# Patient Record
Sex: Female | Born: 1935 | Hispanic: No | Smoking: Former smoker
Health system: Southern US, Community
[De-identification: ages and names within clinical notes are randomized; demographics above are authoritative.]

## PROBLEM LIST (undated history)

## (undated) DIAGNOSIS — I272 Pulmonary hypertension, unspecified: Secondary | ICD-10-CM

## (undated) DIAGNOSIS — K5790 Diverticulosis of intestine, part unspecified, without perforation or abscess without bleeding: Secondary | ICD-10-CM

## (undated) DIAGNOSIS — E1142 Type 2 diabetes mellitus with diabetic polyneuropathy: Secondary | ICD-10-CM

## (undated) DIAGNOSIS — Z950 Presence of cardiac pacemaker: Secondary | ICD-10-CM

## (undated) DIAGNOSIS — I4891 Unspecified atrial fibrillation: Secondary | ICD-10-CM

## (undated) DIAGNOSIS — F0390 Unspecified dementia without behavioral disturbance: Secondary | ICD-10-CM

## (undated) DIAGNOSIS — I739 Peripheral vascular disease, unspecified: Secondary | ICD-10-CM

## (undated) DIAGNOSIS — I340 Nonrheumatic mitral (valve) insufficiency: Secondary | ICD-10-CM

## (undated) DIAGNOSIS — I10 Essential (primary) hypertension: Secondary | ICD-10-CM

## (undated) DIAGNOSIS — E119 Type 2 diabetes mellitus without complications: Secondary | ICD-10-CM

## (undated) DIAGNOSIS — R413 Other amnesia: Secondary | ICD-10-CM

## (undated) DIAGNOSIS — Z9289 Personal history of other medical treatment: Secondary | ICD-10-CM

## (undated) DIAGNOSIS — K449 Diaphragmatic hernia without obstruction or gangrene: Secondary | ICD-10-CM

## (undated) DIAGNOSIS — Z860101 Personal history of adenomatous and serrated colon polyps: Secondary | ICD-10-CM

## (undated) DIAGNOSIS — E041 Nontoxic single thyroid nodule: Secondary | ICD-10-CM

## (undated) DIAGNOSIS — F419 Anxiety disorder, unspecified: Secondary | ICD-10-CM

## (undated) DIAGNOSIS — R590 Localized enlarged lymph nodes: Secondary | ICD-10-CM

## (undated) DIAGNOSIS — I872 Venous insufficiency (chronic) (peripheral): Secondary | ICD-10-CM

## (undated) DIAGNOSIS — M8580 Other specified disorders of bone density and structure, unspecified site: Secondary | ICD-10-CM

## (undated) DIAGNOSIS — I495 Sick sinus syndrome: Secondary | ICD-10-CM

## (undated) DIAGNOSIS — I779 Disorder of arteries and arterioles, unspecified: Secondary | ICD-10-CM

## (undated) DIAGNOSIS — G8929 Other chronic pain: Secondary | ICD-10-CM

## (undated) DIAGNOSIS — M545 Low back pain, unspecified: Secondary | ICD-10-CM

## (undated) DIAGNOSIS — C50919 Malignant neoplasm of unspecified site of unspecified female breast: Secondary | ICD-10-CM

## (undated) DIAGNOSIS — I2699 Other pulmonary embolism without acute cor pulmonale: Secondary | ICD-10-CM

## (undated) DIAGNOSIS — I509 Heart failure, unspecified: Secondary | ICD-10-CM

## (undated) DIAGNOSIS — K209 Esophagitis, unspecified: Secondary | ICD-10-CM

## (undated) DIAGNOSIS — K253 Acute gastric ulcer without hemorrhage or perforation: Secondary | ICD-10-CM

## (undated) DIAGNOSIS — I5032 Chronic diastolic (congestive) heart failure: Secondary | ICD-10-CM

## (undated) DIAGNOSIS — Z86010 Personal history of colonic polyps: Secondary | ICD-10-CM

## (undated) HISTORY — DX: Other amnesia: R41.3

## (undated) HISTORY — DX: Nonrheumatic mitral (valve) insufficiency: I34.0

## (undated) HISTORY — DX: Diverticulosis of intestine, part unspecified, without perforation or abscess without bleeding: K57.90

## (undated) HISTORY — PX: BACK SURGERY: SHX140

## (undated) HISTORY — DX: Unspecified atrial fibrillation: I48.91

## (undated) HISTORY — DX: Peripheral vascular disease, unspecified: I73.9

## (undated) HISTORY — PX: LUMBAR LAMINECTOMY: SHX95

## (undated) HISTORY — DX: Personal history of colonic polyps: Z86.010

## (undated) HISTORY — DX: Personal history of other medical treatment: Z92.89

## (undated) HISTORY — DX: Other pulmonary embolism without acute cor pulmonale: I26.99

## (undated) HISTORY — DX: Localized enlarged lymph nodes: R59.0

## (undated) HISTORY — DX: Type 2 diabetes mellitus with diabetic polyneuropathy: E11.42

## (undated) HISTORY — PX: ABDOMINAL HYSTERECTOMY: SHX81

## (undated) HISTORY — DX: Essential (primary) hypertension: I10

## (undated) HISTORY — DX: Chronic diastolic (congestive) heart failure: I50.32

## (undated) HISTORY — PX: ANKLE FRACTURE SURGERY: SHX122

## (undated) HISTORY — DX: Personal history of adenomatous and serrated colon polyps: Z86.0101

## (undated) HISTORY — DX: Malignant neoplasm of unspecified site of unspecified female breast: C50.919

## (undated) HISTORY — DX: Acute gastric ulcer without hemorrhage or perforation: K25.3

## (undated) HISTORY — DX: Other specified disorders of bone density and structure, unspecified site: M85.80

## (undated) HISTORY — DX: Disorder of arteries and arterioles, unspecified: I77.9

## (undated) HISTORY — DX: Pulmonary hypertension, unspecified: I27.20

## (undated) HISTORY — DX: Venous insufficiency (chronic) (peripheral): I87.2

## (undated) HISTORY — DX: Esophagitis, unspecified: K20.9

## (undated) HISTORY — PX: BREAST LUMPECTOMY: SHX2

## (undated) HISTORY — DX: Nontoxic single thyroid nodule: E04.1

## (undated) HISTORY — DX: Sick sinus syndrome: I49.5

## (undated) HISTORY — DX: Diaphragmatic hernia without obstruction or gangrene: K44.9

## (undated) HISTORY — PX: FRACTURE SURGERY: SHX138

---

## 1999-09-19 ENCOUNTER — Other Ambulatory Visit: Admission: RE | Admit: 1999-09-19 | Discharge: 1999-09-19 | Payer: Self-pay | Admitting: Internal Medicine

## 2000-05-06 ENCOUNTER — Inpatient Hospital Stay (HOSPITAL_COMMUNITY): Admission: EM | Admit: 2000-05-06 | Discharge: 2000-05-07 | Payer: Self-pay | Admitting: *Deleted

## 2001-02-24 ENCOUNTER — Other Ambulatory Visit: Admission: RE | Admit: 2001-02-24 | Discharge: 2001-02-24 | Payer: Self-pay | Admitting: Internal Medicine

## 2004-06-26 ENCOUNTER — Ambulatory Visit (HOSPITAL_COMMUNITY): Admission: RE | Admit: 2004-06-26 | Discharge: 2004-06-26 | Payer: Self-pay | Admitting: Internal Medicine

## 2004-09-30 DIAGNOSIS — I2699 Other pulmonary embolism without acute cor pulmonale: Secondary | ICD-10-CM

## 2004-09-30 DIAGNOSIS — K253 Acute gastric ulcer without hemorrhage or perforation: Secondary | ICD-10-CM

## 2004-09-30 DIAGNOSIS — K449 Diaphragmatic hernia without obstruction or gangrene: Secondary | ICD-10-CM

## 2004-09-30 DIAGNOSIS — K209 Esophagitis, unspecified without bleeding: Secondary | ICD-10-CM

## 2004-09-30 HISTORY — DX: Esophagitis, unspecified without bleeding: K20.90

## 2004-09-30 HISTORY — DX: Acute gastric ulcer without hemorrhage or perforation: K25.3

## 2004-09-30 HISTORY — DX: Other pulmonary embolism without acute cor pulmonale: I26.99

## 2004-09-30 HISTORY — DX: Diaphragmatic hernia without obstruction or gangrene: K44.9

## 2004-10-16 ENCOUNTER — Ambulatory Visit: Payer: Self-pay | Admitting: Internal Medicine

## 2004-10-16 ENCOUNTER — Inpatient Hospital Stay (HOSPITAL_COMMUNITY): Admission: EM | Admit: 2004-10-16 | Discharge: 2004-10-22 | Payer: Self-pay | Admitting: Emergency Medicine

## 2004-10-16 ENCOUNTER — Ambulatory Visit: Payer: Self-pay | Admitting: Cardiology

## 2004-10-18 ENCOUNTER — Encounter (INDEPENDENT_AMBULATORY_CARE_PROVIDER_SITE_OTHER): Payer: Self-pay | Admitting: *Deleted

## 2004-10-20 ENCOUNTER — Encounter (INDEPENDENT_AMBULATORY_CARE_PROVIDER_SITE_OTHER): Payer: Self-pay | Admitting: *Deleted

## 2004-10-30 ENCOUNTER — Encounter: Admission: RE | Admit: 2004-10-30 | Discharge: 2005-01-28 | Payer: Self-pay | Admitting: Cardiology

## 2004-11-05 ENCOUNTER — Ambulatory Visit: Payer: Self-pay | Admitting: Internal Medicine

## 2004-11-08 ENCOUNTER — Ambulatory Visit: Payer: Self-pay | Admitting: Cardiology

## 2004-12-14 ENCOUNTER — Ambulatory Visit: Payer: Self-pay | Admitting: Cardiology

## 2004-12-19 ENCOUNTER — Ambulatory Visit: Payer: Self-pay

## 2005-01-19 ENCOUNTER — Inpatient Hospital Stay (HOSPITAL_COMMUNITY): Admission: EM | Admit: 2005-01-19 | Discharge: 2005-01-23 | Payer: Self-pay | Admitting: Emergency Medicine

## 2005-01-20 ENCOUNTER — Ambulatory Visit: Payer: Self-pay | Admitting: Internal Medicine

## 2005-02-09 ENCOUNTER — Emergency Department (HOSPITAL_COMMUNITY): Admission: EM | Admit: 2005-02-09 | Discharge: 2005-02-09 | Payer: Self-pay | Admitting: Emergency Medicine

## 2005-02-11 ENCOUNTER — Ambulatory Visit: Payer: Self-pay | Admitting: Cardiology

## 2005-02-11 ENCOUNTER — Inpatient Hospital Stay (HOSPITAL_COMMUNITY): Admission: EM | Admit: 2005-02-11 | Discharge: 2005-02-15 | Payer: Self-pay | Admitting: Emergency Medicine

## 2005-02-18 ENCOUNTER — Ambulatory Visit: Payer: Self-pay | Admitting: Cardiology

## 2005-02-22 ENCOUNTER — Ambulatory Visit: Payer: Self-pay | Admitting: Cardiology

## 2005-03-01 ENCOUNTER — Ambulatory Visit: Payer: Self-pay | Admitting: *Deleted

## 2005-03-08 ENCOUNTER — Ambulatory Visit: Payer: Self-pay | Admitting: Cardiology

## 2005-03-15 ENCOUNTER — Ambulatory Visit: Payer: Self-pay | Admitting: Cardiology

## 2005-03-29 ENCOUNTER — Ambulatory Visit: Payer: Self-pay | Admitting: Cardiology

## 2005-04-04 ENCOUNTER — Ambulatory Visit: Payer: Self-pay | Admitting: Internal Medicine

## 2005-04-16 ENCOUNTER — Ambulatory Visit: Payer: Self-pay | Admitting: Cardiology

## 2005-04-25 ENCOUNTER — Ambulatory Visit: Payer: Self-pay | Admitting: Cardiology

## 2005-05-23 ENCOUNTER — Ambulatory Visit: Payer: Self-pay | Admitting: Cardiology

## 2005-06-20 ENCOUNTER — Ambulatory Visit: Payer: Self-pay | Admitting: Cardiology

## 2005-07-18 ENCOUNTER — Ambulatory Visit: Payer: Self-pay | Admitting: Cardiology

## 2005-07-25 ENCOUNTER — Ambulatory Visit: Payer: Self-pay | Admitting: Internal Medicine

## 2005-08-08 ENCOUNTER — Ambulatory Visit: Payer: Self-pay | Admitting: *Deleted

## 2005-09-05 ENCOUNTER — Ambulatory Visit: Payer: Self-pay | Admitting: Internal Medicine

## 2005-09-26 ENCOUNTER — Ambulatory Visit: Payer: Self-pay | Admitting: *Deleted

## 2005-10-17 ENCOUNTER — Ambulatory Visit: Payer: Self-pay | Admitting: Cardiology

## 2005-11-07 ENCOUNTER — Ambulatory Visit: Payer: Self-pay | Admitting: Cardiology

## 2005-12-05 ENCOUNTER — Ambulatory Visit: Payer: Self-pay | Admitting: Cardiology

## 2005-12-17 ENCOUNTER — Ambulatory Visit: Payer: Self-pay | Admitting: Internal Medicine

## 2005-12-26 ENCOUNTER — Ambulatory Visit: Payer: Self-pay

## 2006-01-02 ENCOUNTER — Ambulatory Visit: Payer: Self-pay | Admitting: Cardiology

## 2006-01-15 ENCOUNTER — Ambulatory Visit: Payer: Self-pay | Admitting: *Deleted

## 2006-01-29 ENCOUNTER — Ambulatory Visit: Payer: Self-pay | Admitting: Cardiology

## 2006-02-12 ENCOUNTER — Ambulatory Visit: Payer: Self-pay | Admitting: Cardiology

## 2006-03-12 ENCOUNTER — Ambulatory Visit: Payer: Self-pay | Admitting: Cardiology

## 2006-04-09 ENCOUNTER — Ambulatory Visit: Payer: Self-pay | Admitting: Cardiovascular Disease

## 2006-04-16 ENCOUNTER — Emergency Department (HOSPITAL_COMMUNITY): Admission: EM | Admit: 2006-04-16 | Discharge: 2006-04-16 | Payer: Self-pay | Admitting: Emergency Medicine

## 2006-04-29 ENCOUNTER — Ambulatory Visit: Payer: Self-pay | Admitting: Cardiology

## 2006-05-06 ENCOUNTER — Ambulatory Visit: Payer: Self-pay | Admitting: Cardiology

## 2006-05-22 ENCOUNTER — Ambulatory Visit: Payer: Self-pay | Admitting: Cardiology

## 2006-05-23 ENCOUNTER — Ambulatory Visit: Payer: Self-pay

## 2006-05-23 ENCOUNTER — Encounter: Payer: Self-pay | Admitting: Cardiology

## 2006-05-27 ENCOUNTER — Ambulatory Visit: Payer: Self-pay | Admitting: *Deleted

## 2006-06-09 ENCOUNTER — Emergency Department (HOSPITAL_COMMUNITY): Admission: EM | Admit: 2006-06-09 | Discharge: 2006-06-10 | Payer: Self-pay | Admitting: Emergency Medicine

## 2006-06-13 ENCOUNTER — Ambulatory Visit: Payer: Self-pay | Admitting: Internal Medicine

## 2006-06-23 ENCOUNTER — Ambulatory Visit: Payer: Self-pay | Admitting: Cardiology

## 2006-07-01 ENCOUNTER — Ambulatory Visit: Payer: Self-pay | Admitting: Cardiology

## 2006-07-04 ENCOUNTER — Ambulatory Visit: Payer: Self-pay | Admitting: Cardiology

## 2006-07-07 ENCOUNTER — Ambulatory Visit: Payer: Self-pay | Admitting: Cardiology

## 2006-08-01 ENCOUNTER — Ambulatory Visit: Payer: Self-pay | Admitting: Internal Medicine

## 2006-08-18 ENCOUNTER — Ambulatory Visit: Payer: Self-pay | Admitting: Cardiology

## 2006-08-29 ENCOUNTER — Ambulatory Visit: Payer: Self-pay | Admitting: Cardiology

## 2006-09-15 ENCOUNTER — Ambulatory Visit: Payer: Self-pay | Admitting: Cardiology

## 2006-10-09 ENCOUNTER — Ambulatory Visit: Payer: Self-pay | Admitting: Cardiology

## 2006-10-23 ENCOUNTER — Ambulatory Visit: Payer: Self-pay | Admitting: Cardiology

## 2006-11-20 ENCOUNTER — Ambulatory Visit: Payer: Self-pay | Admitting: Cardiology

## 2006-12-18 ENCOUNTER — Ambulatory Visit: Payer: Self-pay | Admitting: Cardiology

## 2007-01-15 ENCOUNTER — Ambulatory Visit: Payer: Self-pay | Admitting: Cardiology

## 2007-02-06 ENCOUNTER — Ambulatory Visit: Payer: Self-pay | Admitting: Cardiology

## 2007-02-06 LAB — CONVERTED CEMR LAB
Calcium: 9.3 mg/dL (ref 8.4–10.5)
GFR calc Af Amer: 90 mL/min
GFR calc non Af Amer: 75 mL/min
Glucose, Bld: 85 mg/dL (ref 70–99)
Potassium: 4.3 meq/L (ref 3.5–5.1)

## 2007-02-13 ENCOUNTER — Ambulatory Visit: Payer: Self-pay | Admitting: Cardiology

## 2007-03-13 ENCOUNTER — Ambulatory Visit: Payer: Self-pay | Admitting: Cardiology

## 2007-03-27 ENCOUNTER — Ambulatory Visit: Payer: Self-pay | Admitting: Cardiology

## 2007-03-27 LAB — CONVERTED CEMR LAB
Chloride: 107 meq/L (ref 96–112)
GFR calc Af Amer: 62 mL/min
GFR calc non Af Amer: 52 mL/min
Potassium: 3.9 meq/L (ref 3.5–5.1)
Sodium: 142 meq/L (ref 135–145)

## 2007-04-10 ENCOUNTER — Ambulatory Visit: Payer: Self-pay | Admitting: Cardiology

## 2007-05-08 ENCOUNTER — Ambulatory Visit: Payer: Self-pay | Admitting: Cardiology

## 2007-05-12 ENCOUNTER — Ambulatory Visit: Payer: Self-pay | Admitting: Cardiology

## 2007-06-05 ENCOUNTER — Ambulatory Visit: Payer: Self-pay | Admitting: Cardiology

## 2007-07-03 ENCOUNTER — Ambulatory Visit: Payer: Self-pay | Admitting: Internal Medicine

## 2007-08-04 ENCOUNTER — Ambulatory Visit: Payer: Self-pay | Admitting: Cardiology

## 2007-09-01 ENCOUNTER — Ambulatory Visit: Payer: Self-pay | Admitting: Cardiology

## 2007-09-03 ENCOUNTER — Emergency Department (HOSPITAL_COMMUNITY): Admission: EM | Admit: 2007-09-03 | Discharge: 2007-09-03 | Payer: Self-pay | Admitting: Emergency Medicine

## 2007-10-02 ENCOUNTER — Ambulatory Visit: Payer: Self-pay | Admitting: Cardiology

## 2007-10-30 ENCOUNTER — Ambulatory Visit: Payer: Self-pay | Admitting: Cardiology

## 2007-11-27 ENCOUNTER — Ambulatory Visit: Payer: Self-pay | Admitting: Internal Medicine

## 2007-12-25 ENCOUNTER — Ambulatory Visit: Payer: Self-pay | Admitting: Cardiology

## 2008-01-22 ENCOUNTER — Ambulatory Visit: Payer: Self-pay | Admitting: Internal Medicine

## 2008-02-11 ENCOUNTER — Ambulatory Visit: Payer: Self-pay | Admitting: Internal Medicine

## 2008-02-11 ENCOUNTER — Ambulatory Visit: Payer: Self-pay

## 2008-03-10 ENCOUNTER — Ambulatory Visit: Payer: Self-pay | Admitting: Cardiology

## 2008-03-31 ENCOUNTER — Emergency Department (HOSPITAL_COMMUNITY): Admission: EM | Admit: 2008-03-31 | Discharge: 2008-04-01 | Payer: Self-pay | Admitting: Emergency Medicine

## 2008-03-31 ENCOUNTER — Ambulatory Visit: Payer: Self-pay | Admitting: Cardiology

## 2008-04-20 ENCOUNTER — Ambulatory Visit: Payer: Self-pay | Admitting: Cardiology

## 2008-04-22 ENCOUNTER — Ambulatory Visit: Payer: Self-pay

## 2008-05-05 ENCOUNTER — Encounter: Admission: RE | Admit: 2008-05-05 | Discharge: 2008-05-05 | Payer: Self-pay | Admitting: Internal Medicine

## 2008-05-18 ENCOUNTER — Ambulatory Visit: Payer: Self-pay | Admitting: Cardiology

## 2008-05-25 ENCOUNTER — Ambulatory Visit: Payer: Self-pay | Admitting: Cardiology

## 2008-06-01 ENCOUNTER — Other Ambulatory Visit: Admission: RE | Admit: 2008-06-01 | Discharge: 2008-06-01 | Payer: Self-pay | Admitting: Interventional Radiology

## 2008-06-01 ENCOUNTER — Ambulatory Visit: Payer: Self-pay | Admitting: Cardiology

## 2008-06-01 ENCOUNTER — Encounter (INDEPENDENT_AMBULATORY_CARE_PROVIDER_SITE_OTHER): Payer: Self-pay | Admitting: Interventional Radiology

## 2008-06-01 ENCOUNTER — Encounter: Admission: RE | Admit: 2008-06-01 | Discharge: 2008-06-01 | Payer: Self-pay | Admitting: Internal Medicine

## 2008-06-13 ENCOUNTER — Ambulatory Visit: Payer: Self-pay | Admitting: Cardiology

## 2008-06-13 ENCOUNTER — Ambulatory Visit: Payer: Self-pay | Admitting: Pulmonary Disease

## 2008-06-13 DIAGNOSIS — R599 Enlarged lymph nodes, unspecified: Secondary | ICD-10-CM | POA: Insufficient documentation

## 2008-06-13 DIAGNOSIS — J309 Allergic rhinitis, unspecified: Secondary | ICD-10-CM | POA: Insufficient documentation

## 2008-06-13 DIAGNOSIS — E119 Type 2 diabetes mellitus without complications: Secondary | ICD-10-CM | POA: Insufficient documentation

## 2008-07-11 ENCOUNTER — Ambulatory Visit: Payer: Self-pay | Admitting: Cardiology

## 2008-07-13 ENCOUNTER — Ambulatory Visit: Payer: Self-pay | Admitting: Cardiology

## 2008-08-08 ENCOUNTER — Ambulatory Visit: Payer: Self-pay | Admitting: Cardiovascular Disease

## 2008-09-07 ENCOUNTER — Ambulatory Visit: Payer: Self-pay | Admitting: Cardiovascular Disease

## 2008-09-22 ENCOUNTER — Ambulatory Visit: Payer: Self-pay | Admitting: Cardiovascular Disease

## 2008-10-20 ENCOUNTER — Ambulatory Visit: Payer: Self-pay | Admitting: Internal Medicine

## 2008-11-17 ENCOUNTER — Ambulatory Visit: Payer: Self-pay | Admitting: Cardiovascular Disease

## 2008-12-15 ENCOUNTER — Ambulatory Visit: Payer: Self-pay | Admitting: Internal Medicine

## 2009-01-12 ENCOUNTER — Ambulatory Visit: Payer: Self-pay | Admitting: Cardiology

## 2009-02-09 ENCOUNTER — Ambulatory Visit: Payer: Self-pay | Admitting: Cardiology

## 2009-02-28 ENCOUNTER — Encounter: Payer: Self-pay | Admitting: *Deleted

## 2009-03-09 ENCOUNTER — Ambulatory Visit: Payer: Self-pay | Admitting: Cardiovascular Disease

## 2009-03-11 LAB — CONVERTED CEMR LAB: POC INR: 2.6

## 2009-04-05 ENCOUNTER — Encounter: Payer: Self-pay | Admitting: *Deleted

## 2009-04-06 ENCOUNTER — Ambulatory Visit: Payer: Self-pay | Admitting: Cardiology

## 2009-04-08 ENCOUNTER — Emergency Department (HOSPITAL_COMMUNITY): Admission: EM | Admit: 2009-04-08 | Discharge: 2009-04-08 | Payer: Self-pay | Admitting: Emergency Medicine

## 2009-04-10 ENCOUNTER — Telehealth: Payer: Self-pay | Admitting: Cardiology

## 2009-04-21 ENCOUNTER — Ambulatory Visit: Payer: Self-pay | Admitting: Internal Medicine

## 2009-04-21 LAB — CONVERTED CEMR LAB: Prothrombin Time: 12.9 s

## 2009-04-28 ENCOUNTER — Ambulatory Visit: Payer: Self-pay | Admitting: Cardiology

## 2009-05-09 ENCOUNTER — Inpatient Hospital Stay: Payer: Self-pay | Admitting: Internal Medicine

## 2009-05-11 ENCOUNTER — Telehealth (INDEPENDENT_AMBULATORY_CARE_PROVIDER_SITE_OTHER): Payer: Self-pay | Admitting: *Deleted

## 2009-05-26 ENCOUNTER — Ambulatory Visit: Payer: Self-pay | Admitting: Internal Medicine

## 2009-05-26 LAB — CONVERTED CEMR LAB: POC INR: 2.5

## 2009-06-23 ENCOUNTER — Ambulatory Visit: Payer: Self-pay | Admitting: Internal Medicine

## 2009-06-23 LAB — CONVERTED CEMR LAB: POC INR: 2.9

## 2009-06-30 HISTORY — PX: INSERT / REPLACE / REMOVE PACEMAKER: SUR710

## 2009-07-15 ENCOUNTER — Emergency Department (HOSPITAL_COMMUNITY): Admission: EM | Admit: 2009-07-15 | Discharge: 2009-07-15 | Payer: Self-pay | Admitting: Emergency Medicine

## 2009-07-17 ENCOUNTER — Telehealth: Payer: Self-pay | Admitting: Cardiology

## 2009-07-19 ENCOUNTER — Ambulatory Visit: Payer: Self-pay | Admitting: Internal Medicine

## 2009-07-19 DIAGNOSIS — N390 Urinary tract infection, site not specified: Secondary | ICD-10-CM | POA: Insufficient documentation

## 2009-07-25 ENCOUNTER — Ambulatory Visit (HOSPITAL_COMMUNITY): Admission: RE | Admit: 2009-07-25 | Discharge: 2009-07-25 | Payer: Self-pay | Admitting: Internal Medicine

## 2009-07-25 ENCOUNTER — Ambulatory Visit: Payer: Self-pay | Admitting: Cardiology

## 2009-07-25 ENCOUNTER — Telehealth: Payer: Self-pay | Admitting: Cardiology

## 2009-07-25 ENCOUNTER — Ambulatory Visit: Payer: Self-pay

## 2009-07-25 ENCOUNTER — Encounter: Payer: Self-pay | Admitting: Internal Medicine

## 2009-07-31 ENCOUNTER — Telehealth: Payer: Self-pay | Admitting: Cardiology

## 2009-07-31 ENCOUNTER — Inpatient Hospital Stay (HOSPITAL_COMMUNITY): Admission: AD | Admit: 2009-07-31 | Discharge: 2009-08-04 | Payer: Self-pay | Admitting: Cardiology

## 2009-07-31 ENCOUNTER — Encounter: Payer: Self-pay | Admitting: Cardiology

## 2009-07-31 ENCOUNTER — Ambulatory Visit: Payer: Self-pay | Admitting: Internal Medicine

## 2009-08-04 ENCOUNTER — Encounter: Payer: Self-pay | Admitting: Cardiology

## 2009-08-07 ENCOUNTER — Telehealth (INDEPENDENT_AMBULATORY_CARE_PROVIDER_SITE_OTHER): Payer: Self-pay | Admitting: Radiology

## 2009-08-08 ENCOUNTER — Ambulatory Visit: Payer: Self-pay | Admitting: Internal Medicine

## 2009-08-08 ENCOUNTER — Ambulatory Visit: Payer: Self-pay

## 2009-08-08 ENCOUNTER — Ambulatory Visit: Payer: Self-pay | Admitting: Cardiology

## 2009-08-08 ENCOUNTER — Encounter (HOSPITAL_COMMUNITY): Admission: RE | Admit: 2009-08-08 | Discharge: 2009-09-28 | Payer: Self-pay | Admitting: Internal Medicine

## 2009-08-08 LAB — CONVERTED CEMR LAB
Calcium: 9.5 mg/dL (ref 8.4–10.5)
Creatinine, Ser: 0.9 mg/dL (ref 0.4–1.2)
GFR calc non Af Amer: 78.13 mL/min (ref >60)
POC INR: 2.3
Sodium: 140 meq/L (ref 135–145)

## 2009-08-13 ENCOUNTER — Encounter: Payer: Self-pay | Admitting: Cardiology

## 2009-08-14 ENCOUNTER — Encounter: Payer: Self-pay | Admitting: Internal Medicine

## 2009-08-14 ENCOUNTER — Ambulatory Visit: Payer: Self-pay | Admitting: Cardiology

## 2009-08-14 ENCOUNTER — Ambulatory Visit: Payer: Self-pay

## 2009-08-14 LAB — CONVERTED CEMR LAB: POC INR: 3.6

## 2009-08-15 ENCOUNTER — Encounter: Payer: Self-pay | Admitting: Cardiology

## 2009-08-17 ENCOUNTER — Encounter: Payer: Self-pay | Admitting: Cardiology

## 2009-08-21 ENCOUNTER — Telehealth: Payer: Self-pay | Admitting: Cardiology

## 2009-08-22 ENCOUNTER — Ambulatory Visit: Payer: Self-pay | Admitting: Cardiovascular Disease

## 2009-08-22 ENCOUNTER — Encounter: Payer: Self-pay | Admitting: Nurse Practitioner

## 2009-08-23 LAB — CONVERTED CEMR LAB: Pro B Natriuretic peptide (BNP): 147 pg/mL — ABNORMAL HIGH (ref 0.0–100.0)

## 2009-08-30 ENCOUNTER — Ambulatory Visit: Payer: Self-pay | Admitting: Cardiology

## 2009-08-30 LAB — CONVERTED CEMR LAB: POC INR: 4.1

## 2009-09-13 ENCOUNTER — Ambulatory Visit: Payer: Self-pay | Admitting: Cardiology

## 2009-09-13 ENCOUNTER — Encounter (INDEPENDENT_AMBULATORY_CARE_PROVIDER_SITE_OTHER): Payer: Self-pay | Admitting: Cardiology

## 2009-10-04 ENCOUNTER — Encounter (INDEPENDENT_AMBULATORY_CARE_PROVIDER_SITE_OTHER): Payer: Self-pay | Admitting: Cardiology

## 2009-10-04 ENCOUNTER — Ambulatory Visit: Payer: Self-pay | Admitting: Cardiology

## 2009-11-13 ENCOUNTER — Ambulatory Visit: Payer: Self-pay | Admitting: Cardiology

## 2009-11-13 ENCOUNTER — Encounter (INDEPENDENT_AMBULATORY_CARE_PROVIDER_SITE_OTHER): Payer: Self-pay | Admitting: Cardiology

## 2009-11-13 ENCOUNTER — Ambulatory Visit: Payer: Self-pay | Admitting: Internal Medicine

## 2009-12-11 ENCOUNTER — Encounter: Payer: Self-pay | Admitting: Cardiology

## 2009-12-12 ENCOUNTER — Ambulatory Visit: Payer: Self-pay | Admitting: Cardiology

## 2010-01-09 ENCOUNTER — Ambulatory Visit: Payer: Self-pay | Admitting: Cardiology

## 2010-01-09 LAB — CONVERTED CEMR LAB: POC INR: 2.5

## 2010-01-12 ENCOUNTER — Encounter: Admission: RE | Admit: 2010-01-12 | Discharge: 2010-01-12 | Payer: Self-pay | Admitting: Internal Medicine

## 2010-01-16 ENCOUNTER — Encounter: Admission: RE | Admit: 2010-01-16 | Discharge: 2010-01-16 | Payer: Self-pay | Admitting: Internal Medicine

## 2010-01-18 ENCOUNTER — Encounter: Payer: Self-pay | Admitting: Cardiology

## 2010-01-23 ENCOUNTER — Telehealth: Payer: Self-pay | Admitting: Cardiology

## 2010-02-06 ENCOUNTER — Encounter: Admission: RE | Admit: 2010-02-06 | Discharge: 2010-02-06 | Payer: Self-pay | Admitting: Surgery

## 2010-02-07 ENCOUNTER — Ambulatory Visit (HOSPITAL_BASED_OUTPATIENT_CLINIC_OR_DEPARTMENT_OTHER): Admission: RE | Admit: 2010-02-07 | Discharge: 2010-02-08 | Payer: Self-pay | Admitting: Surgery

## 2010-02-14 ENCOUNTER — Ambulatory Visit: Payer: Self-pay | Admitting: Cardiology

## 2010-02-14 LAB — CONVERTED CEMR LAB: POC INR: 2.3

## 2010-02-15 ENCOUNTER — Ambulatory Visit: Payer: Self-pay | Admitting: Oncology

## 2010-02-23 ENCOUNTER — Emergency Department (HOSPITAL_COMMUNITY): Admission: EM | Admit: 2010-02-23 | Discharge: 2010-02-23 | Payer: Self-pay | Admitting: Emergency Medicine

## 2010-02-24 ENCOUNTER — Emergency Department (HOSPITAL_COMMUNITY): Admission: EM | Admit: 2010-02-24 | Discharge: 2010-02-24 | Payer: Self-pay | Admitting: Emergency Medicine

## 2010-02-28 LAB — CBC WITH DIFFERENTIAL/PLATELET
BASO%: 1 % (ref 0.0–2.0)
HCT: 36.1 % (ref 34.8–46.6)
LYMPH%: 20.4 % (ref 14.0–49.7)
MCHC: 34.3 g/dL (ref 31.5–36.0)
MCV: 87.6 fL (ref 79.5–101.0)
MONO#: 0.5 10*3/uL (ref 0.1–0.9)
MONO%: 10.6 % (ref 0.0–14.0)
NEUT%: 65.8 % (ref 38.4–76.8)
Platelets: 389 10*3/uL (ref 145–400)
RBC: 4.12 10*6/uL (ref 3.70–5.45)
WBC: 4.9 10*3/uL (ref 3.9–10.3)

## 2010-02-28 LAB — COMPREHENSIVE METABOLIC PANEL
ALT: 12 U/L (ref 0–35)
Alkaline Phosphatase: 94 U/L (ref 39–117)
CO2: 24 mEq/L (ref 19–32)
Creatinine, Ser: 1.18 mg/dL (ref 0.40–1.20)
Glucose, Bld: 104 mg/dL — ABNORMAL HIGH (ref 70–99)
Total Bilirubin: 0.5 mg/dL (ref 0.3–1.2)

## 2010-03-05 ENCOUNTER — Ambulatory Visit: Admission: RE | Admit: 2010-03-05 | Discharge: 2010-05-24 | Payer: Self-pay | Admitting: Radiation Oncology

## 2010-03-06 ENCOUNTER — Encounter: Payer: Self-pay | Admitting: Cardiology

## 2010-03-08 ENCOUNTER — Encounter: Admission: RE | Admit: 2010-03-08 | Discharge: 2010-03-08 | Payer: Self-pay | Admitting: Oncology

## 2010-03-14 ENCOUNTER — Ambulatory Visit: Payer: Self-pay | Admitting: Cardiology

## 2010-03-14 LAB — CONVERTED CEMR LAB: POC INR: 2.2

## 2010-04-11 ENCOUNTER — Ambulatory Visit: Payer: Self-pay | Admitting: Cardiology

## 2010-04-15 ENCOUNTER — Emergency Department (HOSPITAL_COMMUNITY): Admission: EM | Admit: 2010-04-15 | Discharge: 2010-04-16 | Payer: Self-pay | Admitting: Emergency Medicine

## 2010-04-16 ENCOUNTER — Telehealth (INDEPENDENT_AMBULATORY_CARE_PROVIDER_SITE_OTHER): Payer: Self-pay | Admitting: *Deleted

## 2010-05-09 ENCOUNTER — Ambulatory Visit: Payer: Self-pay | Admitting: Cardiology

## 2010-05-09 LAB — CONVERTED CEMR LAB: POC INR: 1.7

## 2010-05-24 ENCOUNTER — Ambulatory Visit: Payer: Self-pay | Admitting: Internal Medicine

## 2010-05-24 LAB — CONVERTED CEMR LAB: POC INR: 1.8

## 2010-06-07 ENCOUNTER — Ambulatory Visit: Payer: Self-pay | Admitting: Cardiology

## 2010-06-12 ENCOUNTER — Ambulatory Visit: Payer: Self-pay | Admitting: Oncology

## 2010-06-18 ENCOUNTER — Encounter: Payer: Self-pay | Admitting: Cardiology

## 2010-07-05 ENCOUNTER — Ambulatory Visit: Payer: Self-pay | Admitting: Internal Medicine

## 2010-08-02 ENCOUNTER — Ambulatory Visit: Payer: Self-pay | Admitting: Cardiology

## 2010-08-22 ENCOUNTER — Ambulatory Visit: Payer: Self-pay | Admitting: Internal Medicine

## 2010-08-31 ENCOUNTER — Ambulatory Visit: Payer: Self-pay | Admitting: Cardiology

## 2010-09-11 ENCOUNTER — Ambulatory Visit: Payer: Self-pay | Admitting: Oncology

## 2010-09-28 ENCOUNTER — Ambulatory Visit: Payer: Self-pay

## 2010-09-28 ENCOUNTER — Ambulatory Visit: Payer: Self-pay | Admitting: Cardiology

## 2010-09-28 ENCOUNTER — Encounter: Payer: Self-pay | Admitting: Cardiology

## 2010-09-28 LAB — CONVERTED CEMR LAB: POC INR: 2.5

## 2010-10-21 ENCOUNTER — Encounter: Payer: Self-pay | Admitting: Internal Medicine

## 2010-10-22 ENCOUNTER — Encounter: Payer: Self-pay | Admitting: Internal Medicine

## 2010-10-22 ENCOUNTER — Encounter: Payer: Self-pay | Admitting: Pulmonary Disease

## 2010-10-26 ENCOUNTER — Ambulatory Visit: Admission: RE | Admit: 2010-10-26 | Discharge: 2010-10-26 | Payer: Self-pay | Source: Home / Self Care

## 2010-10-26 LAB — CONVERTED CEMR LAB: POC INR: 2.5

## 2010-10-28 LAB — CONVERTED CEMR LAB: POC INR: 2.7

## 2010-11-01 NOTE — Progress Notes (Signed)
Summary: Coumadin Question  Phone Note Call from Patient   Caller: Patient Call For: Coumadin Clinic Summary of Call: Pt was seen in ER yesterday after syncopal episode.  She was diagnosed with UTI.  INR was 1. 72.  She was prescribed an abx x 3 days- unsure which one but likely Cipro.  Instructed pt to take an extra 1/2 tablet of Coumadin today then reusme same dose.  WIll keep her original appt but she is to call if any abx are changed in the meantime.  Initial call taken by: Porfirio Oar PharmD,  April 16, 2010 8:27 AM

## 2010-11-01 NOTE — Medication Information (Signed)
Summary: rov/tm  Anticoagulant Therapy  Managed by: Tula Nakayama, RN, BSN Referring MD: Dola Argyle, MD PCP: Jeanann Lewandowsky, MD Supervising MD: Aundra Dubin MD, Easton Fetty Indication 1: Pulmonary embolus (ICD-415.19) Indication 2: Gastrointestinal Hemorrhage (ICD-578) Lab Used: [facility] [place] Site: [facility] INR POC 2.1 INR RANGE 2 - 3  Dietary changes: no    Health status changes: no    Bleeding/hemorrhagic complications: no    Recent/future hospitalizations: no    Any changes in medication regimen? yes       Details: started anastrozole 1mg  daily  Recent/future dental: no  Any missed doses?: no       Is patient compliant with meds? yes       Current Medications (verified): 1)  Metoprolol Tartrate 100 Mg Tabs (Metoprolol Tartrate) .... Take One Tablet By Mouth Twice A Day 2)  Warfarin Sodium 5 Mg Tabs (Warfarin Sodium) .... Take As Directed By Coumadin Clinic 3)  Adult Aspirin Ec Low Strength 81 Mg Tbec (Aspirin) .... Once Daily 4)  Vitamin B-12 500 Mcg Tabs (Cyanocobalamin) .... Once Daily 5)  Vitamin B-6 Cr 200 Mg Cr-Tabs (Pyridoxine Hcl) .... Once Daily 6)  Metformin Hcl 500 Mg Tabs (Metformin Hcl) .Marland Kitchen.. 1 1/2 Tab By Mouth Two Times A Day 7)  Diovan 160 Mg Tabs (Valsartan) .... Once Daily 8)  Cardizem Cd 240 Mg Xr24h-Cap (Diltiazem Hcl Coated Beads) .Marland Kitchen.. 1 Cap By Mouth Daily 9)  Furosemide 20 Mg Tabs (Furosemide) .... Take One Tablet By Mouth Daily. 10)  Flecainide Acetate 100 Mg Tabs (Flecainide Acetate) .... Take 1 Tablet By Mouth Two Times A Day 11)  Anastrozole 1 Mg Tabs (Anastrozole) .... Take 1 By Mouth Daily 12)  Oyst-Cal D 250-125 Mg-Unit Tabs (Calcium Carbonate-Vitamin D) .... Take 1 By Mouth Daily  Allergies: No Known Drug Allergies  Anticoagulation Management History:      The patient is taking warfarin and comes in today for a routine follow up visit.  Positive risk factors for bleeding include an age of 76 years or older and presence of serious comorbidities.   The bleeding index is 'intermediate risk'.  Positive CHADS2 values include History of HTN, Age > 19 years old, and History of Diabetes.  The start date was 02/12/2005.  Her last INR was 4.1.  Anticoagulation responsible provider: Aundra Dubin MD, Asherah Lavoy.  INR POC: 2.1.  Cuvette Lot#: [id number].  Exp: 06/2011.    Anticoagulation Management Assessment/Plan:      The patient's current anticoagulation dose is Warfarin sodium 5 mg tabs: take as directed by coumadin clinic.  The target INR is 2 - 3.  The next INR is due 05/09/2010.  Anticoagulation instructions were given to patient.  Results were reviewed/authorized by Tula Nakayama, RN, BSN.  She was notified by Lind Covert.         Prior Anticoagulation Instructions: INR 2.2 Continue 2.5mg s everyday except 5mg s on Tuesdays and Thursdays. Recheck in 4 weeks.   Current Anticoagulation Instructions: INR 2.1  Continue same regimen of 1 tab on Tuesday and Thursday and 1/2 tab all other days.  Re-check in 4 weeks.

## 2010-11-01 NOTE — Medication Information (Signed)
Summary: rov/eac  Anticoagulant Therapy  Managed by: Porfirio Oar, PharmD Referring MD: Dola Argyle, MD PCP: Jeanann Lewandowsky, MD Supervising MD: Aundra Dubin MD, Kirk Ruths Indication 1: Pulmonary embolus (ICD-415.19) Indication 2: Gastrointestinal Hemorrhage (ICD-578) Lab Used: [facility] [place] Site: [facility] INR POC 2.5 INR RANGE 2 - 3  Dietary changes: no    Health status changes: no    Bleeding/hemorrhagic complications: no    Recent/future hospitalizations: no    Any changes in medication regimen? no    Recent/future dental: no  Any missed doses?: no       Is patient compliant with meds? yes       Allergies: No Known Drug Allergies  Anticoagulation Management History:      The patient is taking warfarin and comes in today for a routine follow up visit.  Positive risk factors for bleeding include an age of 75 years or older and presence of serious comorbidities.  The bleeding index is 'intermediate risk'.  Positive CHADS2 values include History of HTN, Age > 29 years old, and History of Diabetes.  The start date was 02/12/2005.  Her last INR was 4.1.  Anticoagulation responsible provider: Aundra Dubin MD, Jerrit Horen.  INR POC: 2.5.  Cuvette Lot#: [id number].  Exp: 01/2011.    Anticoagulation Management Assessment/Plan:      The patient's current anticoagulation dose is Warfarin sodium 5 mg tabs: take as directed by coumadin clinic.  The target INR is 2 - 3.  The next INR is due 02/06/2010.  Anticoagulation instructions were given to patient.  Results were reviewed/authorized by Porfirio Oar, PharmD.  She was notified by Porfirio Oar PharmD.         Prior Anticoagulation Instructions: INR 2.9  Continue taking 1 tablet on Tuesday and Thursday and 1/2 tablet all other days.  Return to clinic in 4 weeks.    Current Anticoagulation Instructions: INR 2.5  Continue same dose of 1/2 tablet every day except 1 tablet on Tuesday and Thursday

## 2010-11-01 NOTE — Cardiovascular Report (Signed)
Summary: Office Visit   Office Visit   Imported By: Sallee Provencal 11/20/2009 16:30:43  _____________________________________________________________________  External Attachment:    Type:   Image     Comment:   External Document

## 2010-11-01 NOTE — Assessment & Plan Note (Signed)
Summary: per check out      Allergies Added: NKDA  Visit Type:  ROV Referring Provider:  Dola Argyle Primary Provider:  Jeanann Lewandowsky, MD  CC:  No cardiac complaints..  History of Present Illness: the patient is 77 years old and return for followup management of her pacemaker and atrial fibrillation. She has a history of paroxysmal atrial fibrillation and on November 2 she underwent implantation of a Medtronic DDD pacemaker for long sinus pauses associated with presyncope following conversion from atrial fib to sinus rhythm. She has done well since that time with no recurrent palpitations or shortness of breath.  She did have some fatigue and chest pain and was evaluated with a Myoview scan which was negative and showed an ejection fraction of 69%. Her other main problem is hypertension.  Greater fibrillation is now managed with Flecanide and Coumadin.  Current Medications (verified): 1)  Isosorbide Mononitrate Cr 30 Mg Xr24h-Tab (Isosorbide Mononitrate) .... Take 1 Tablet By Mouth Once A Day 2)  Metoprolol Tartrate 100 Mg Tabs (Metoprolol Tartrate) .... Take One Tablet By Mouth Twice A Day 3)  Warfarin Sodium 5 Mg Tabs (Warfarin Sodium) .... Take As Directed By Coumadin Clinic 4)  Adult Aspirin Ec Low Strength 81 Mg Tbec (Aspirin) .... Once Daily 5)  Vitamin B-12 500 Mcg Tabs (Cyanocobalamin) .... Once Daily 6)  Vitamin B-6 Cr 200 Mg Cr-Tabs (Pyridoxine Hcl) .... Once Daily 7)  Metformin Hcl 500 Mg Tabs (Metformin Hcl) .Marland Kitchen.. 1 1/2 Tab By Mouth Two Times A Day 8)  Diovan 160 Mg Tabs (Valsartan) .... Once Daily 9)  Cardizem Cd 240 Mg Xr24h-Cap (Diltiazem Hcl Coated Beads) .Marland Kitchen.. 1 Cap By Mouth Daily 10)  Furosemide 20 Mg Tabs (Furosemide) .... Take One Tablet By Mouth Daily. 11)  Flecainide Acetate 100 Mg Tabs (Flecainide Acetate) .... Take 1 Tablet By Mouth Two Times A Day  Allergies (verified): No Known Drug Allergies  Past History:  Past Medical History: Reviewed history from  08/13/2009 and no changes required. Myoview ..no ischemia..EF 65% ...08/08/2009 PAROXYSMAL ATRIAL FIBRILLATION (ICD-427.31) Pacemaker , permanent....07/2009.Marland KitchenMarland KitchenMedtronic.Marland KitchenMarland KitchenBrady-tachy with long pauses and syncope Syncope....PTVDP Atrial flutter  07/2009.Marland KitchenMarland Kitchenflecainide Flecainide Rx Vulome overload...mild...07/2009 EF  60%  echo  ..06/2009 Pulmonary hypertension .Marland Kitchen21mmHg...echo...06/2009 MR  mild...echo...06/2009 HYPERTENSION (ICD-401.9) DVT (ICD-453.40)  .and Pul Embolus in the past Dyspnea   --Myoview negative 7/09 Carotid artery disease  ..[id number] RICA, [id number] LICA  doppler .Marland KitchenMarland Kitchen5/2009 PULMONARY EMBOLISM, HX OF (ICD-V12.51) - 2006   --Chest CT 2009 No PE DM (ICD-250.00) MEDIASTINAL LYMPHADENOPATHY (ICD-785.6) ALLERGIC RHINITIS (ICD-477.9) THYROID NODULE, HX OF (ICD-V12.2)...non-neoplastic 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  Review of Systems       ROS is negative except as outlined in HPI.   Vital Signs:  Patient profile:   77 year old female Height:      72 inches Weight:      236 pounds Pulse rate:   60 / minute Pulse rhythm:   regular BP sitting:   112 / 62  (left arm)  Vitals Entered By: Alvis Lemmings, RN, BSN (November 13, 2009 8:27 AM)  Physical Exam  Additional Exam:  Gen. Well-nourished, in no distress   Neck: No JVD, thyroid not enlarged, no carotid bruits Lungs: No tachypnea, clear without rales, rhonchi or wheezes Cardiovascular: Rhythm regular, PMI not displaced,  heart sounds  normal, grade 2/6 systolic murmur at the left sternal edge and to the base and apex, trace bilateral peripheral edema, pulses normal in all 4 extremities. Abdomen: BS normal, abdomen soft and non-tender without  masses or organomegaly, no hepatosplenomegaly. MS: No deformities, no cyanosis or clubbing   Neuro:  No focal sns   Skin:  no lesions    PPM Specifications Following MD:  Vanna Scotland. Olevia Perches, MD     PPM Vendor:  Medtronic     PPM Model Number:  [id number]     PPM Serial Number:  [id number] [facility] PPM DOI:   06/30/2009     PPM Implanting MD:  Vanna Scotland. Olevia Perches, MD  Lead 1    Location: [facility]     DOI: 06/30/2009     Model #: [id number]     Serial #: [id number]     Status: active Lead 2    Location: [facility]     DOI: 06/30/2009     Model #: [id number]     Serial #[id number]     Status: active  Magnet Response Rate:  BOL 85 ERI  65  Indications:  Tachy-brady syndrome   PPM Follow Up Remote Check?  No Battery Voltage:  2.79 V     Battery Est. Longevity:  10 YEARS     Pacer Dependent:  Yes       PPM Device Measurements Atrium  Amplitude: PACED AT 30 mV, Impedance: 492 ohms, Threshold: 0.75 V at 0.4 msec Right Ventricle  Amplitude: 22.4 mV, Impedance: 717 ohms, Threshold: 0.5 V at 0.4 msec  Episodes MS Episodes:  0     Coumadin:  Yes Ventricular High Rate:  0     Atrial Pacing:  99.9%     Ventricular Pacing:  <0.1%  Parameters Mode:  MVP (R)     Lower Rate Limit:  60     Upper Rate Limit:  130 Paced AV Delay:  180     Sensed AV Delay:  150 Rate Response Parameters:  ADL response-3, Exertion response-3, Threshold-Med/Low, Acceleration-30 seconds, Deceleration-Exercise Next Cardiology Appt Due:  07/31/2010 Tech Comments:  Normal device function.  RA and RV autocapture already on.  No changes made today.  ROV 11-11 Dr Olevia Perches. Chanetta Marshall RN BSN  November 13, 2009 8:42 AM   Impression & Recommendations:  Problem # 1:  ATRIAL FIBRILLATION, HX OF (ICD-V12.59) She has paroxysmal atrial fibrillation which is managed with flexion night and Coumadin and her pacemaker. She has had no recent symptomatic recurrences in his palm appears stable. The following medications were removed from the medication list:    Isosorbide Mononitrate Cr 30 Mg Xr24h-tab (Isosorbide mononitrate) .Marland Kitchen... Take 1 tablet by mouth once a day Her updated medication list for this problem includes:    Metoprolol Tartrate 100 Mg Tabs (Metoprolol tartrate) .Marland Kitchen... Take one tablet by mouth twice a day    Warfarin Sodium 5 Mg Tabs (Warfarin sodium) .Marland Kitchen...  Take as directed by coumadin clinic    Adult Aspirin Ec Low Strength 81 Mg Tbec (Aspirin) ..... Once daily    Cardizem Cd 240 Mg Xr24h-cap (Diltiazem hcl coated beads) .Marland Kitchen... 1 cap by mouth daily    Flecainide Acetate 100 Mg Tabs (Flecainide acetate) .Marland Kitchen... Take 1 tablet by mouth two times a day  Problem # 2:  PACEMAKER, PERMANENT (ICD-V45.01) she underwent implantation of a DDD pacemaker on August 01, 2009. She is pacing the atrium all the time and pacing the ventricle none of the time. She has good thresholds on both leads and good impedances on both leads.  Problem # 3:  HYPERTENSION (ICD-401.9)  Her blood pressure is well controlled on current medications.  She has had no recurrent chest pain  and her Myoview was negative. We will DC her isosorbide.  Her updated medication list for this problem includes:    Metoprolol Tartrate 100 Mg Tabs (Metoprolol tartrate) .Marland Kitchen... Take one tablet by mouth twice a day    Adult Aspirin Ec Low Strength 81 Mg Tbec (Aspirin) ..... Once daily    Diovan 160 Mg Tabs (Valsartan) ..... Once daily    Cardizem Cd 240 Mg Xr24h-cap (Diltiazem hcl coated beads) .Marland Kitchen... 1 cap by mouth daily    Furosemide 20 Mg Tabs (Furosemide) .Marland Kitchen... Take one tablet by mouth daily.  Patient Instructions: 1)  Your physician has recommended you make the following change in your medication: 1) STOP Isosorbide (imdur). 2)  Your physician wants you to follow-up in: 1 year with Dr. Rayann Heman for pacemaker followup.  You will receive a reminder letter in the mail two months in advance. If you don't receive a letter, please call our office to schedule the follow-up appointment.

## 2010-11-01 NOTE — Letter (Signed)
Summary: [facility] - Physician Query Form  [facility] - Physician Query Form   Imported By: Marilynne Drivers 10/12/2009 10:34:55  _____________________________________________________________________  External Attachment:    Type:   Image     Comment:   External Document

## 2010-11-01 NOTE — Medication Information (Signed)
Summary: rov.m  Anticoagulant Therapy  Managed by: Margaretha Sheffield, PharmD Referring MD: Dola Argyle, MD PCP: Jeanann Lewandowsky, MD Supervising MD: Aundra Dubin MD, Ladaija Dimino Indication 1: Pulmonary embolus (ICD-415.19) Indication 2: Gastrointestinal Hemorrhage (ICD-578) Lab Used: [facility] Site: [facility] INR POC 2.9 INR RANGE 2 - 3  Dietary changes: no    Health status changes: no    Bleeding/hemorrhagic complications: no    Recent/future hospitalizations: no    Any changes in medication regimen? no    Recent/future dental: no  Any missed doses?: no       Is patient compliant with meds? yes       Allergies: No Known Drug Allergies  Anticoagulation Management History:      The patient is taking warfarin and comes in today for a routine follow up visit.  Positive risk factors for bleeding include an age of 75 years or older and presence of serious comorbidities.  The bleeding index is 'intermediate risk'.  Positive CHADS2 values include History of HTN, Age > 61 years old, and History of Diabetes.  The start date was 02/12/2005.  Her last INR was 4.1.  Anticoagulation responsible provider: Aundra Dubin MD, Seydina Holliman.  INR POC: 2.9.  Cuvette Lot#: [id number].  Exp: 01/2011.    Anticoagulation Management Assessment/Plan:      The patient's current anticoagulation dose is Warfarin sodium 5 mg tabs: take as directed by coumadin clinic.  The target INR is 2 - 3.  The next INR is due 01/09/2010.  Anticoagulation instructions were given to patient.  Results were reviewed/authorized by Margaretha Sheffield, PharmD.  She was notified by Margaretha Sheffield.         Prior Anticoagulation Instructions: INR 3.1 Take 0.5 tab tomorrow (Tuesday, February 15th)  then 0.5 tab daily except 1 tab on Tuesday and Thursday.    Recheck in 4 weeks.    Current Anticoagulation Instructions: INR 2.9  Continue taking 1 tablet on Tuesday and Thursday and 1/2 tablet all other days.  Return to clinic in 4 weeks.

## 2010-11-01 NOTE — Medication Information (Signed)
Summary: rov/tm  Anticoagulant Therapy  Managed by: Tula Nakayama, RN, BSN Referring MD: Dola Argyle, MD PCP: Jeanann Lewandowsky, MD Supervising MD: Ron Parker MD, Dellis Filbert Indication 1: Pulmonary embolus (ICD-415.19) Indication 2: Gastrointestinal Hemorrhage (ICD-578) Lab Used: [facility] Site: [facility] INR POC 2.2 INR RANGE 2 - 3  Dietary changes: no    Health status changes: no    Bleeding/hemorrhagic complications: no    Recent/future hospitalizations: no    Any changes in medication regimen? no    Recent/future dental: no  Any missed doses?: no       Is patient compliant with meds? yes       Allergies: No Known Drug Allergies  Anticoagulation Management History:      The patient is taking warfarin and comes in today for a routine follow up visit.  Positive risk factors for bleeding include an age of 75 years or older and presence of serious comorbidities.  The bleeding index is 'intermediate risk'.  Positive CHADS2 values include History of HTN, Age > 60 years old, and History of Diabetes.  The start date was 02/12/2005.  Her last INR was 4.1.  Anticoagulation responsible provider: Ron Parker MD, Dellis Filbert.  INR POC: 2.2.  Cuvette Lot#: [id number].  Exp: 05/2011.    Anticoagulation Management Assessment/Plan:      The patient's current anticoagulation dose is Warfarin sodium 5 mg tabs: take as directed by coumadin clinic.  The target INR is 2 - 3.  The next INR is due 04/11/2010.  Anticoagulation instructions were given to patient.  Results were reviewed/authorized by Tula Nakayama, RN, BSN.  She was notified by Tula Nakayama, RN, BSN.         Prior Anticoagulation Instructions: INR 2.3 Continue 2.5mg  daily except 5mg  on Tuesdays and Thursdays. Recheck in 4 weeks.   Current Anticoagulation Instructions: INR 2.2 Continue 2.5mg s everyday except 5mg s on Tuesdays and Thursdays. Recheck in 4 weeks.

## 2010-11-01 NOTE — Assessment & Plan Note (Signed)
Summary: 4 mo f/u  Medications Added VITAMIN D 1000 UNIT  TABS (CHOLECALCIFEROL) 1.25 mgs two times a day      Allergies Added: NKDA  Visit Type:  Follow-up Primary Provider:  Jeanann Lewandowsky, MD  CC:  syncope.  History of Present Illness: The patient is seen for followup of paroxysmal atrial fibrillation and syncope.  She had a pacemaker placed in November, 2010.  She is doing well and has had no recurrent syncope.  She has no chest pain.  She feeling very well.  Current Medications (verified): 1)  Metoprolol Tartrate 100 Mg Tabs (Metoprolol Tartrate) .... Take One Tablet By Mouth Twice A Day 2)  Warfarin Sodium 5 Mg Tabs (Warfarin Sodium) .... Take As Directed By Coumadin Clinic 3)  Adult Aspirin Ec Low Strength 81 Mg Tbec (Aspirin) .... Once Daily 4)  Vitamin B-12 500 Mcg Tabs (Cyanocobalamin) .... Once Daily 5)  Vitamin B-6 Cr 200 Mg Cr-Tabs (Pyridoxine Hcl) .... Once Daily 6)  Metformin Hcl 500 Mg Tabs (Metformin Hcl) .Marland Kitchen.. 1 1/2 Tab By Mouth Two Times A Day 7)  Diovan 160 Mg Tabs (Valsartan) .... Once Daily 8)  Cardizem Cd 240 Mg Xr24h-Cap (Diltiazem Hcl Coated Beads) .Marland Kitchen.. 1 Cap By Mouth Daily 9)  Furosemide 20 Mg Tabs (Furosemide) .... Take One Tablet By Mouth Daily. 10)  Flecainide Acetate 100 Mg Tabs (Flecainide Acetate) .... Take 1 Tablet By Mouth Two Times A Day 11)  Vitamin D 1000 Unit  Tabs (Cholecalciferol) .... 1.25 Mgs Two Times A Day  Allergies (verified): No Known Drug Allergies  Past History:  Past Medical History: Last updated: 12/11/2009 Myoview ..no ischemia..EF 65% ...08/08/2009 PAROXYSMAL ATRIAL FIBRILLATION (ICD-427.31) Pacemaker , permanent....07/2009.Marland KitchenMarland KitchenMedtronic.Marland KitchenMarland KitchenBrady-tachy with long pauses and syncope Syncope....PTVDP Atrial flutter  07/2009.Marland KitchenMarland Kitchenflecainide Flecainide Rx Vulome overload...mild...07/2009 EF  60%  echo  ..06/2009 Pulmonary hypertension .Marland Kitchen21mmHg...echo...06/2009 MR  mild...echo...06/2009 HYPERTENSION (ICD-401.9) DVT (ICD-453.40)   .and Pul Embolus in the past Dyspnea   --Myoview negative 7/09 Carotid artery disease  ..[id number] RICA, 123456 LICA  doppler .Marland KitchenMarland Kitchen5/2009 PULMONARY EMBOLISM, HX OF (ICD-V12.51) - 2006   --Chest CT 2009 No PE DM (ICD-250.00) MEDIASTINAL LYMPHADENOPATHY (ICD-785.6) ALLERGIC RHINITIS (ICD-477.9) THYROID NODULE, HX OF (ICD-V12.2)...non-neoplastic 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 Coumadin Rx  Review of Systems       Patient denies fever, chills, headache, sweats, rash, change in vision, change in hearing, chest pain, shortness of breath, cough, nausea vomiting, urinary symptoms.  All other systems are reviewed and are negative.  Vital Signs:  Patient profile:   77 year old female Height:      72 inches Weight:      236 pounds BMI:     32.12 Pulse rate:   75 / minute BP sitting:   96 / 54  (left arm) Cuff size:   regular  Vitals Entered By: Mignon Pine, RMA (December 12, 2009 8:22 AM)  Physical Exam  General:  patient is stable today. Eyes:  no xanthelasma. Neck:  no jugular venous distention. Lungs:  lungs are clear respiratory effort is nonlabored. Heart:  cardiac exam reveals S1-S2.  No clicks or significant murmurs. Abdomen:  abdomen is soft. Extremities:  no peripheral edema. Psych:  patient is oriented to person time and place.  Affect is normal.   PPM Specifications Following MD:  Vanna Scotland. Olevia Perches, MD     PPM Vendor:  Medtronic     PPM Model Number:  [id number]     PPM Serial Number:  [id number] PPM DOI:  06/30/2009     PPM  Implanting MD:  Vanna Scotland Olevia Perches, MD  Lead 1    Location: [facility]     DOI: 06/30/2009     Model #: [id number]     Serial #: [id number]     Status: active Lead 2    Location: [facility]     DOI: 06/30/2009     Model #: [id number]     Serial #[id number]     Status: active  Magnet Response Rate:  BOL 85 ERI  65  Indications:  Tachy-brady syndrome   PPM Follow Up Pacer Dependent:  Yes      Episodes Coumadin:  Yes  Parameters Mode:  MVP (R)     Lower Rate Limit:  60     Upper Rate Limit:   130 Paced AV Delay:  180     Sensed AV Delay:  150 Rate Response Parameters:  ADL response-3, Exertion response-3, Threshold-Med/Low, Acceleration-30 seconds, Deceleration-Exercise  Impression & Recommendations:  Problem # 1:  SHORTNESS OF BREATH (ICD-786.05) Patient is not having any shortness of breath.  She is doing well.  Problem # 2:  COUMADIN THERAPY (ICD-V58.61) Coumadin therapy is continued.  She is stable.  Problem # 3:  PACEMAKER, PERMANENT (ICD-V45.01) Her pacemaker is working well.  She's had no recurrent syncope or presyncope.  Her pacemaker was formally checked by Dr.Brodie.  Recently.  Problem # 4:  SYNCOPE (ICD-780.2) Has been no recurrent syncope or presyncope.  Problem # 5:  ATRIAL FIBRILLATION, HX OF (ICD-V12.59) The patient has had no recurrent atrial fib or atrial flutter.  She is on flecainide.  Atrophic lichen I. level will be arranged.  Patient Instructions: 1)  Follow up in 9 months   Visit Type:  Follow-up Primary Provider:  Jeanann Lewandowsky, MD  CC:  syncope.  History of Present Illness: The patient is seen for followup of paroxysmal atrial fibrillation and syncope.  She had a pacemaker placed in November, 2010.  She is doing well and has had no recurrent syncope.  She has no chest pain.  She feeling very well.

## 2010-11-01 NOTE — Miscellaneous (Signed)
  Clinical Lists Changes  Observations: Added new observation of PAST MED HX: Myoview ..no ischemia..EF 65% ...08/08/2009 PAROXYSMAL ATRIAL FIBRILLATION (ICD-427.31) Pacemaker , permanent....07/2009.Marland KitchenMarland KitchenMedtronic.Marland KitchenMarland KitchenBrady-tachy with long pauses and syncope Syncope....PTVDP Atrial flutter  07/2009.Marland KitchenMarland Kitchenflecainide Flecainide Rx Vulome overload...mild...07/2009 EF  60%  echo  ..06/2009 Pulmonary hypertension .Marland Kitchen64mmHg...echo...06/2009 MR  mild...echo...06/2009 HYPERTENSION (ICD-401.9) DVT (ICD-453.40)  .and Pul Embolus in the past Dyspnea   --Myoview negative 7/09 Carotid artery disease  ..[id number] RICA, [id number] LICA  doppler .Marland KitchenMarland Kitchen5/2009 PULMONARY EMBOLISM, HX OF (ICD-V12.51) - 2006   --Chest CT 2009 No PE DM (ICD-250.00) MEDIASTINAL LYMPHADENOPATHY (ICD-785.6) ALLERGIC RHINITIS (ICD-477.9) THYROID NODULE, HX OF (ICD-V12.2)...non-neoplastic 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 Coumadin Rx  (12/11/2009 12:46) Added new observation of PRIMARY MD: Jeanann Lewandowsky, MD (12/11/2009 12:46)       Past History:  Past Medical History: Myoview ..no ischemia..EF 65% ...08/08/2009 PAROXYSMAL ATRIAL FIBRILLATION (ICD-427.31) Pacemaker , permanent....07/2009.Marland KitchenMarland KitchenMedtronic.Marland KitchenMarland KitchenBrady-tachy with long pauses and syncope Syncope....PTVDP Atrial flutter  07/2009.Marland KitchenMarland Kitchenflecainide Flecainide Rx Vulome overload...mild...07/2009 EF  60%  echo  ..06/2009 Pulmonary hypertension .Marland Kitchen60mmHg...echo...06/2009 MR  mild...echo...06/2009 HYPERTENSION (ICD-401.9) DVT (ICD-453.40)  .and Pul Embolus in the past Dyspnea   --Myoview negative 7/09 Carotid artery disease  ..[id number] RICA, [id number] LICA  doppler .Marland KitchenMarland Kitchen5/2009 PULMONARY EMBOLISM, HX OF (ICD-V12.51) - 2006   --Chest CT 2009 No PE DM (ICD-250.00) MEDIASTINAL LYMPHADENOPATHY (ICD-785.6) ALLERGIC RHINITIS (ICD-477.9) THYROID NODULE, HX OF (ICD-V12.2)...non-neoplastic 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 Coumadin Rx

## 2010-11-01 NOTE — Letter (Signed)
Summary: [place]   Imported By: Sallee Provencal 04/30/2010 14:31:49  _____________________________________________________________________  External Attachment:    Type:   Image     Comment:   External Document

## 2010-11-01 NOTE — Medication Information (Signed)
Summary: rov/cs   Anticoagulant Therapy  Managed by: Porfirio Oar, PharmD Referring MD: Dola Argyle, MD PCP: Jeanann Lewandowsky, MD Supervising MD: Haroldine Laws MD, Quillian Quince Indication 1: Pulmonary embolus (ICD-415.19) Indication 2: Gastrointestinal Hemorrhage (ICD-578) Lab Used: [facility] Site: [facility] INR POC 2.3 INR RANGE 2 - 3  Dietary changes: no    Health status changes: no    Bleeding/hemorrhagic complications: no    Recent/future hospitalizations: no    Any changes in medication regimen? no    Recent/future dental: no  Any missed doses?: no       Is patient compliant with meds? yes       Allergies: No Known Drug Allergies  Anticoagulation Management History:      The patient is taking warfarin and comes in today for a routine follow up visit.  Positive risk factors for bleeding include an age of 75 years or older and presence of serious comorbidities.  The bleeding index is 'intermediate risk'.  Positive CHADS2 values include History of HTN, Age > 65 years old, and History of Diabetes.  The start date was 02/12/2005.  Her last INR was 4.1.  Anticoagulation responsible provider: Bensimhon MD, Quillian Quince.  INR POC: 2.3.  Cuvette Lot#: [id number].  Exp: 08/2011.    Anticoagulation Management Assessment/Plan:      The patient's current anticoagulation dose is Warfarin sodium 5 mg tabs: take as directed by coumadin clinic.  The target INR is 2 - 3.  The next INR is due 08/02/2010.  Anticoagulation instructions were given to patient.  Results were reviewed/authorized by Porfirio Oar, PharmD.  She was notified by Ralene Bathe, PharmD Candidate.         Prior Anticoagulation Instructions: INR 2.2  Continue taking one-half tablet every day except one tablet on Sunday, Tuesday, and Thursday.  We will see you in 4 weeks.  Current Anticoagulation Instructions: INR 2.3  Continue Coumadin as scheduled:  1/2 tablet on Monday, Wednesday, Friday, Saturday, and 1 tablet on Sunday,  Tuesday, and Thursday.    Return to clinic in 4 weeks.

## 2010-11-01 NOTE — Letter (Signed)
Summary: [place]   Imported By: Marilynne Drivers 04/18/2010 16:58:56  _____________________________________________________________________  External Attachment:    Type:   Image     Comment:   External Document

## 2010-11-01 NOTE — Medication Information (Signed)
Summary: rov/mw   Anticoagulant Therapy  Managed by: Porfirio Oar, PharmD Referring MD: Dola Argyle, MD PCP: Jeanann Lewandowsky, MD Supervising MD: Haroldine Laws MD, Quillian Quince Indication 1: Pulmonary embolus (ICD-415.19) Indication 2: Gastrointestinal Hemorrhage (ICD-578) Lab Used: [facility] Site: [facility] INR POC 2.2 INR RANGE 2 - 3  Dietary changes: no    Health status changes: no    Bleeding/hemorrhagic complications: no    Recent/future hospitalizations: no    Any changes in medication regimen? no    Recent/future dental: no  Any missed doses?: no       Is patient compliant with meds? yes       Allergies: No Known Drug Allergies  Anticoagulation Management History:      The patient is taking warfarin and comes in today for a routine follow up visit.  Positive risk factors for bleeding include an age of 75 years or older and presence of serious comorbidities.  The bleeding index is 'intermediate risk'.  Positive CHADS2 values include History of HTN, Age > 37 years old, and History of Diabetes.  The start date was 02/12/2005.  Her last INR was 4.1.  Anticoagulation responsible provider: Bensimhon MD, Quillian Quince.  INR POC: 2.2.  Cuvette Lot#: [id number].  Exp: 08/2011.    Anticoagulation Management Assessment/Plan:      The patient's current anticoagulation dose is Warfarin sodium 5 mg tabs: take as directed by coumadin clinic.  The target INR is 2 - 3.  The next INR is due 08/31/2010.  Anticoagulation instructions were given to patient.  Results were reviewed/authorized by Porfirio Oar, PharmD.  She was notified by Carmin Richmond, PharmD Candidate.         Prior Anticoagulation Instructions: INR 1.9 Tomorrow take a whole tablet instead of 1. Then resume normal dosing schedule of 1 tablet on sunday, tuesday, and thursday. And a half tablet all other days. Recheck in 3 weeks.   Current Anticoagulation Instructions: INR 2.2 Continue previous dose of 0.5 tablet everyday except 1 tablet on  Sunday, Tuesday, Thursday Recheck INR prior to Dr. Kae Heller appointment

## 2010-11-01 NOTE — Medication Information (Signed)
Summary: rov/ln  Anticoagulant Therapy  Managed by: Tula Nakayama, RN, BSN Referring MD: Dola Argyle, MD PCP: Jeanann Lewandowsky, MD Supervising MD: Aundra Dubin MD, Dalton Indication 1: Pulmonary embolus (ICD-415.19) Indication 2: Gastrointestinal Hemorrhage (ICD-578) Lab Used: [place] Site: [facility] INR POC 1.7 INR RANGE 2 - 3  Dietary changes: no    Health status changes: yes       Details: Had diarrhea yesterday and day before  Bleeding/hemorrhagic complications: no    Recent/future hospitalizations: no    Any changes in medication regimen? yes       Details: PRN immodium   Recent/future dental: no  Any missed doses?: no       Is patient compliant with meds? yes       Allergies: No Known Drug Allergies  Anticoagulation Management History:      The patient is taking warfarin and comes in today for a routine follow up visit.  Positive risk factors for bleeding include an age of 75 years or older and presence of serious comorbidities.  The bleeding index is 'intermediate risk'.  Positive CHADS2 values include History of HTN, Age > 75 years old, and History of Diabetes.  The start date was 02/12/2005.  Her last INR was 4.1.  Anticoagulation responsible provider: Aundra Dubin MD, [place].  INR POC: 1.7.  Cuvette Lot#: [id number].  Exp: 07/2011.    Anticoagulation Management Assessment/Plan:      The patient's current anticoagulation dose is Warfarin sodium 5 mg tabs: take as directed by coumadin clinic.  The target INR is 2 - 3.  The next INR is due 05/24/2010.  Anticoagulation instructions were given to patient.  Results were reviewed/authorized by Tula Nakayama, RN, BSN.  She was notified by Tula Nakayama, RN, BSN.         Prior Anticoagulation Instructions: INR 2.1  Continue same regimen of 1 tab on Tuesday and Thursday and 1/2 tab all other days.  Re-check in 4 weeks.  Current Anticoagulation Instructions: INR 1.7 Today take 5mg s then resume 2.5mg s daily except 5mg s on Tuesdays  and Thursdays. Recheck in 2 weeks.

## 2010-11-01 NOTE — Letter (Signed)
Summary: Handout Printed  Printed Handout:  - Coumadin Instructions-w/out Meds 

## 2010-11-01 NOTE — Medication Information (Signed)
Summary: rov/sp  Anticoagulant Therapy  Managed by: Tula Nakayama, RN, BSN Referring MD: Dola Argyle, MD PCP: Jeanann Lewandowsky, MD Supervising MD: Stanford Breed MD, Aaron Edelman Indication 1: Pulmonary embolus (ICD-415.19) Indication 2: Gastrointestinal Hemorrhage (ICD-578) Lab Used: [facility] Site: [facility] INR POC 2.3 INR RANGE 2 - 3  Dietary changes: no    Health status changes: no    Bleeding/hemorrhagic complications: no    Recent/future hospitalizations: no    Any changes in medication regimen? no    Recent/future dental: no  Any missed doses?: no       Is patient compliant with meds? yes      Comments: Restarted last Thursday, Surgery was Wednesday, Mastectomy.  Was off 3 days prior.   Allergies: No Known Drug Allergies  Anticoagulation Management History:      The patient is taking warfarin and comes in today for a routine follow up visit.  Positive risk factors for bleeding include an age of 75 years or older and presence of serious comorbidities.  The bleeding index is 'intermediate risk'.  Positive CHADS2 values include History of HTN, Age > 1 years old, and History of Diabetes.  The start date was 02/12/2005.  Her last INR was 4.1.  Anticoagulation responsible provider: Stanford Breed MD, Aaron Edelman.  INR POC: 2.3.  Cuvette Lot#: [id number].  Exp: 05/2011.    Anticoagulation Management Assessment/Plan:      The patient's current anticoagulation dose is Warfarin sodium 5 mg tabs: take as directed by coumadin clinic.  The target INR is 2 - 3.  The next INR is due 03/14/2010.  Anticoagulation instructions were given to patient.  Results were reviewed/authorized by Tula Nakayama, RN, BSN.  She was notified by Tula Nakayama, RN, BSN.         Prior Anticoagulation Instructions: INR 2.5  Continue same dose of 1/2 tablet every day except 1 tablet on Tuesday and Thursday   Current Anticoagulation Instructions: INR 2.3 Continue 2.5mg  daily except 5mg  on Tuesdays and Thursdays. Recheck in 4  weeks.

## 2010-11-01 NOTE — Medication Information (Signed)
Summary: rov/cs   Anticoagulant Therapy  Managed by: Mammie Lorenzo, PharmD Referring MD: Dola Argyle, MD PCP: Jeanann Lewandowsky, MD Supervising MD: Mare Ferrari Indication 1: Pulmonary embolus (ICD-415.19) Indication 2: Gastrointestinal Hemorrhage (ICD-578) Lab Used: [facility] Site: [facility] INR POC 1.9 INR RANGE 2 - 3  Dietary changes: no    Health status changes: no    Bleeding/hemorrhagic complications: yes       Details: cut thumb on a can of dog and it took a long time to stop bleeding   Recent/future hospitalizations: no    Any changes in medication regimen? no    Recent/future dental: no  Any missed doses?: no       Is patient compliant with meds? yes       Allergies: No Known Drug Allergies  Anticoagulation Management History:      The patient is taking warfarin and comes in today for a routine follow up visit.  Positive risk factors for bleeding include an age of 75 years or older and presence of serious comorbidities.  The bleeding index is 'intermediate risk'.  Positive CHADS2 values include History of HTN, Age > 63 years old, and History of Diabetes.  The start date was 02/12/2005.  Her last INR was 4.1.  Anticoagulation responsible provider: Tacarra Justo.  INR POC: 1.9.  Cuvette Lot#: [id number].  Exp: 08/2011.    Anticoagulation Management Assessment/Plan:      The patient's current anticoagulation dose is Warfarin sodium 5 mg tabs: take as directed by coumadin clinic.  The target INR is 2 - 3.  The next INR is due 08/22/2010.  Anticoagulation instructions were given to patient.  Results were reviewed/authorized by Mammie Lorenzo, PharmD.         Prior Anticoagulation Instructions: INR 2.3  Continue Coumadin as scheduled:  1/2 tablet on Monday, Wednesday, Friday, Saturday, and 1 tablet on Sunday, Tuesday, and Thursday.    Return to clinic in 4 weeks.   Current Anticoagulation Instructions: INR 1.9 Tomorrow take a whole tablet instead of 1. Then resume normal  dosing schedule of 1 tablet on sunday, tuesday, and thursday. And a half tablet all other days. Recheck in 3 weeks.

## 2010-11-01 NOTE — Medication Information (Signed)
Summary: rov/sp  Anticoagulant Therapy  Managed by: Freddrick March, RN, BSN Referring MD: Dola Argyle, MD PCP: Jeanann Lewandowsky, MD Supervising MD: Aundra Dubin MD, Truc Winfree Indication 1: Pulmonary embolus (ICD-415.19) Indication 2: Gastrointestinal Hemorrhage (ICD-578) Lab Used: [facility] Site: [facility] INR POC 2.5 INR RANGE 2 - 3  Dietary changes: no    Health status changes: no    Bleeding/hemorrhagic complications: no    Recent/future hospitalizations: no    Any changes in medication regimen? no    Recent/future dental: no  Any missed doses?: no       Is patient compliant with meds? yes       Allergies: No Known Drug Allergies  Anticoagulation Management History:      The patient is taking warfarin and comes in today for a routine follow up visit.  Positive risk factors for bleeding include an age of 75 years or older and presence of serious comorbidities.  The bleeding index is 'intermediate risk'.  Positive CHADS2 values include History of HTN, Age > 61 years old, and History of Diabetes.  The start date was 02/12/2005.  Her last INR was 4.1.  Anticoagulation responsible provider: Aundra Dubin MD, Reah Justo.  INR POC: 2.5.  Cuvette Lot#: [id number].  Exp: 11/2011.    Anticoagulation Management Assessment/Plan:      The patient's current anticoagulation dose is Warfarin sodium 5 mg tabs: take as directed by coumadin clinic.  The target INR is 2 - 3.  The next INR is due 10/26/2010.  Anticoagulation instructions were given to patient.  Results were reviewed/authorized by Freddrick March, RN, BSN.  She was notified by Freddrick March RN.         Prior Anticoagulation Instructions: INR 2.5  Continue same dose of 1/2 tablet every day except 1 tablet on Sunday, Tuesday and Thursday.  Recheck INR in 4weeks.   Current Anticoagulation Instructions: INR 2.5  Continue on same dosage 1/2 tablet daily except 1 tablet on Sundays, Tuesdays, and Thursdays.  Recheck in 4 weeks.

## 2010-11-01 NOTE — Medication Information (Signed)
Summary: rov/ewj   Anticoagulant Therapy  Managed by: Danella Penton, RN Referring MD: Dola Argyle, MD PCP: Jeanann Lewandowsky, MD Supervising MD: Leonid Manus, P Indication 1: Pulmonary embolus (ICD-415.19) Indication 2: Gastrointestinal Hemorrhage (ICD-578) Lab Used: [facility] Site: [facility] INR POC 2.5 INR RANGE 2 - 3  Dietary changes: no    Health status changes: no    Bleeding/hemorrhagic complications: no    Recent/future hospitalizations: no    Any changes in medication regimen? no    Recent/future dental: no  Any missed doses?: no       Is patient compliant with meds? yes       Allergies: No Known Drug Allergies  Anticoagulation Management History:      The patient is taking warfarin and comes in today for a routine follow up visit.  Positive risk factors for bleeding include an age of 75 years or older and presence of serious comorbidities.  The bleeding index is 'intermediate risk'.  Positive CHADS2 values include History of HTN, Age > 65 years old, and History of Diabetes.  The start date was 02/12/2005.  Her last INR was 4.1.  Anticoagulation responsible provider: Gavon Majano, P.  INR POC: 2.5.  Cuvette Lot#: [id number].  Exp: 10/2011.    Anticoagulation Management Assessment/Plan:      The patient's current anticoagulation dose is Warfarin sodium 5 mg tabs: take as directed by coumadin clinic.  The target INR is 2 - 3.  The next INR is due 11/23/2010.  Anticoagulation instructions were given to patient.  Results were reviewed/authorized by Danella Penton, RN.  She was notified by Danella Penton, RN.         Prior Anticoagulation Instructions: INR 2.5  Continue on same dosage 1/2 tablet daily except 1 tablet on Sundays, Tuesdays, and Thursdays.  Recheck in 4 weeks.    Current Anticoagulation Instructions: INR 2.5 Continue taking 1/2 tablet every day except 1 tablet on Sundays, Tuesdays, and Thursdays. Recheck in 4 weeks.

## 2010-11-01 NOTE — Medication Information (Signed)
Summary: rov/ez  Anticoagulant Therapy  Managed by: Alinda Deem, PharmD, BCPS, CPP Referring MD: Dola Argyle, MD PCP: Jeanann Lewandowsky, MD Supervising MD: Haroldine Laws MD, Quillian Quince Indication 1: Pulmonary embolus (ICD-415.19) Indication 2: Gastrointestinal Hemorrhage (ICD-578) Lab Used: [facility] Site: [facility] INR POC 3.1 INR RANGE 2 - 3  Dietary changes: no    Health status changes: no    Bleeding/hemorrhagic complications: no    Recent/future hospitalizations: no    Any changes in medication regimen? no    Recent/future dental: no  Any missed doses?: no       Is patient compliant with meds? yes       Allergies (verified): No Known Drug Allergies  Anticoagulation Management History:      The patient is taking warfarin and comes in today for a routine follow up visit.  Positive risk factors for bleeding include an age of 75 years or older and presence of serious comorbidities.  The bleeding index is 'intermediate risk'.  Positive CHADS2 values include History of HTN, Age > 32 years old, and History of Diabetes.  The start date was 02/12/2005.  Her last INR was 4.1.  Anticoagulation responsible provider: Lilu Mcglown MD, Quillian Quince.  INR POC: 3.1.  Cuvette Lot#: [id number].  Exp: 12/2010.    Anticoagulation Management Assessment/Plan:      The patient's current anticoagulation dose is Warfarin sodium 5 mg tabs: take as directed by coumadin clinic.  The target INR is 2 - 3.  The next INR is due 12/11/2009.  Anticoagulation instructions were given to patient.  Results were reviewed/authorized by Alinda Deem, PharmD, BCPS, CPP.  She was notified by Alinda Deem PharmD, BCPS, CPP.         Prior Anticoagulation Instructions: INR 2.7 Continue with same dosage 1 tablet daily except 1/2 tablet on Mondays, Wednesdays, Fridays and Saturdays.   Recheck in 4 weeks recommended.    Current Anticoagulation Instructions: INR 3.1 Take 0.5 tab tomorrow (Tuesday, February 15th)  then 0.5 tab daily  except 1 tab on Tuesday and Thursday.    Recheck in 4 weeks.

## 2010-11-01 NOTE — Medication Information (Signed)
Summary: rov.mp  Anticoagulant Therapy  Managed by: Merlyn Albert, Pharm D Candidate Referring MD: Dola Argyle, MD PCP: Jeanann Lewandowsky, MD Supervising MD: Aundra Dubin MD, Shaquela Weichert Indication 1: Pulmonary embolus (ICD-415.19) Indication 2: Gastrointestinal Hemorrhage (ICD-578) Lab Used: [facility] Site: [facility] INR POC 2.7 INR RANGE 2 - 3  Dietary changes: no    Health status changes: no    Bleeding/hemorrhagic complications: no    Recent/future hospitalizations: no    Any changes in medication regimen? no    Recent/future dental: no  Any missed doses?: no       Is patient compliant with meds? yes      Comments: Wishes not to return until 2/14 appt with BBrodie.  Precautions given.   Allergies (verified): No Known Drug Allergies  Anticoagulation Management History:      The patient is taking warfarin and comes in today for a routine follow up visit.  Positive risk factors for bleeding include an age of 75 years or older and presence of serious comorbidities.  The bleeding index is 'intermediate risk'.  Positive CHADS2 values include History of HTN, Age > 42 years old, and History of Diabetes.  The start date was 02/12/2005.  Her last INR was 4.1.  Anticoagulation responsible provider: Aundra Dubin MD, Adalid Beckmann.  INR POC: 2.7.  Cuvette Lot#: [id number].  Exp: 10/2010.    Anticoagulation Management Assessment/Plan:      The patient's current anticoagulation dose is Warfarin sodium 5 mg tabs: take as directed by coumadin clinic.  The target INR is 2 - 3.  The next INR is due 11/13/2009.  Anticoagulation instructions were given to patient.  Results were reviewed/authorized by Merlyn Albert, Pharm D Candidate.  She was notified by Freddrick March RN.         Prior Anticoagulation Instructions: INR 2.9  Take 0.5 tab each Monday, Wednesday, Friday, Saturday. Take 1 tab on all other days.    Recheck in The patient is to hold four doses of coumadin.  The dosage to be resumed includes:    Current Anticoagulation Instructions: INR 2.7 Continue with same dosage 1 tablet daily except 1/2 tablet on Mondays, Wednesdays, Fridays and Saturdays.   Recheck in 4 weeks recommended.

## 2010-11-01 NOTE — Medication Information (Signed)
Summary: rov/tm  Anticoagulant Therapy  Managed by: Tula Nakayama, RN, BSN Referring MD: Dola Argyle, MD PCP: Jeanann Lewandowsky, MD Supervising MD: Stanford Breed MD, Aaron Edelman Indication 1: Pulmonary embolus (ICD-415.19) Indication 2: Gastrointestinal Hemorrhage (ICD-578) Lab Used: [place] Site: [facility] INR POC 2.2 INR RANGE 2 - 3  Dietary changes: no    Health status changes: no    Bleeding/hemorrhagic complications: no    Recent/future hospitalizations: no    Any changes in medication regimen? no    Recent/future dental: no  Any missed doses?: no       Is patient compliant with meds? yes       Allergies: No Known Drug Allergies  Anticoagulation Management History:      Positive risk factors for bleeding include an age of 68 years or older and presence of serious comorbidities.  The bleeding index is 'intermediate risk'.  Positive CHADS2 values include History of HTN, Age > 52 years old, and History of Diabetes.  The start date was 02/12/2005.  Her last INR was 4.1.  Anticoagulation responsible Daimon Kean: Stanford Breed MD, Aaron Edelman.  INR POC: 2.2.  Cuvette Lot#: [id number].  Exp: 08/2011.    Anticoagulation Management Assessment/Plan:      The patient's current anticoagulation dose is Warfarin sodium 5 mg tabs: take as directed by coumadin clinic.  The target INR is 2 - 3.  The next INR is due 07/05/2010.  Anticoagulation instructions were given to patient.  Results were reviewed/authorized by Tula Nakayama, RN, BSN.  She was notified by Sanda Linger.         Prior Anticoagulation Instructions: INR 1.8 Today take 1.5 tablet then change dose to 1/2  tablet everyday except 1  tablet on Tuesdays, Thursdays and Sundays. Recheck in 2 weeks.   Current Anticoagulation Instructions: INR 2.2  Continue taking one-half tablet every day except one tablet on Sunday, Tuesday, and Thursday.  We will see you in 4 weeks.

## 2010-11-01 NOTE — Progress Notes (Signed)
Summary: FYI - newly DX Breast Cancer   Phone Note From Other Clinic   Caller: nurse Deneise Lever Summary of Call: Per Deneise Lever from [facility] pt needs clearance for surgery. Newly diagnosed breast cancer. ofc [id number] fax [phone] Initial call taken by: Lorraine Lax,  January 23, 2010 9:49 AM  Follow-up for Phone Call        per Roselyn Reef - Dr Rush Farmer called and said thye dont need clearance. Follow-up by: Sim Boast, RN,  January 23, 2010 9:55 AM     Appended Document: Juluis Rainier - newly DX Breast Cancer OK  Appended Document: FYI - newly DX Breast Cancer F/u with Deneise Lever at Dr. Trevor Mace office to make sure pt did not need clearance.  Per the pre-op orders, pt needs to hold Coumadin and ASA 3 days prior to procedure.  Will send to Dr. Ron Parker for his approval.   Appended Document: FYI - newly DX Breast Cancer Agree.Marland KitchenMarland KitchenRon Parker  Appended Document: FYI - newly DX Breast Cancer Spoke with pt.  She is aware to hold Coumadin x 3 days.  Will restart per Dr. Pryor Montes instructions.  F/U INR on 5/18

## 2010-11-01 NOTE — Medication Information (Signed)
Summary: rov/tm  Anticoagulant Therapy  Managed by: Tula Nakayama, RN, BSN Referring MD: Dola Argyle, MD PCP: Jeanann Lewandowsky, MD Supervising MD: Harrington Challenger MD, Nevin Bloodgood Indication 1: Pulmonary embolus (ICD-415.19) Indication 2: Gastrointestinal Hemorrhage (ICD-578) Lab Used: [facility] Site: [facility] INR POC 1.8 INR RANGE 2 - 3  Dietary changes: no    Health status changes: no    Bleeding/hemorrhagic complications: no    Recent/future hospitalizations: no    Any changes in medication regimen? no    Recent/future dental: no  Any missed doses?: no       Is patient compliant with meds? yes       Allergies: No Known Drug Allergies  Anticoagulation Management History:      The patient is taking warfarin and comes in today for a routine follow up visit.  Positive risk factors for bleeding include an age of 75 years or older and presence of serious comorbidities.  The bleeding index is 'intermediate risk'.  Positive CHADS2 values include History of HTN, Age > 49 years old, and History of Diabetes.  The start date was 02/12/2005.  Her last INR was 4.1.  Anticoagulation responsible provider: Harrington Challenger MD, Nevin Bloodgood.  INR POC: 1.8.  Cuvette Lot#: [id number].  Exp: 07/2011.    Anticoagulation Management Assessment/Plan:      The patient's current anticoagulation dose is Warfarin sodium 5 mg tabs: take as directed by coumadin clinic.  The target INR is 2 - 3.  The next INR is due 06/07/2010.  Anticoagulation instructions were given to patient.  Results were reviewed/authorized by Tula Nakayama, RN, BSN.  She was notified by Tula Nakayama, RN, BSN.         Prior Anticoagulation Instructions: INR 1.7 Today take 5mg s then resume 2.5mg s daily except 5mg s on Tuesdays and Thursdays. Recheck in 2 weeks.   Current Anticoagulation Instructions: INR 1.8 Today take 1.5 tablet then change dose to 1/2  tablet everyday except 1  tablet on Tuesdays, Thursdays and Sundays. Recheck in 2 weeks.

## 2010-11-01 NOTE — Medication Information (Signed)
Summary: rov/nb   Anticoagulant Therapy  Managed by: Porfirio Oar, PharmD Referring MD: Dola Argyle, MD PCP: Jeanann Lewandowsky, MD Supervising MD: Stanford Breed MD, Aaron Edelman Indication 1: Pulmonary embolus (ICD-415.19) Indication 2: Gastrointestinal Hemorrhage (ICD-578) Lab Used: [facility] Site: [facility] INR POC 2.5 INR RANGE 2 - 3  Dietary changes: no    Health status changes: no    Bleeding/hemorrhagic complications: no    Recent/future hospitalizations: no    Any changes in medication regimen? no    Recent/future dental: no  Any missed doses?: no       Is patient compliant with meds? yes       Allergies: No Known Drug Allergies  Anticoagulation Management History:      The patient is taking warfarin and comes in today for a routine follow up visit.  Positive risk factors for bleeding include an age of 75 years or older and presence of serious comorbidities.  The bleeding index is 'intermediate risk'.  Positive CHADS2 values include History of HTN, Age > 75 years old, and History of Diabetes.  The start date was 02/12/2005.  Her last INR was 4.1.  Anticoagulation responsible provider: Stanford Breed MD, Aaron Edelman.  INR POC: 2.5.  Cuvette Lot#: [id number].  Exp: 08/2011.    Anticoagulation Management Assessment/Plan:      The patient's current anticoagulation dose is Warfarin sodium 5 mg tabs: take as directed by coumadin clinic.  The target INR is 2 - 3.  The next INR is due 09/28/2010.  Anticoagulation instructions were given to patient.  Results were reviewed/authorized by Porfirio Oar, PharmD.  She was notified by Porfirio Oar, PharmD.         Prior Anticoagulation Instructions: INR 2.2 Continue previous dose of 0.5 tablet everyday except 1 tablet on Sunday, Tuesday, Thursday Recheck INR prior to Dr. Kae Heller appointment  Current Anticoagulation Instructions: INR 2.5  Continue same dose of 1/2 tablet every day except 1 tablet on Sunday, Tuesday and Thursday.  Recheck INR in 4weeks.

## 2010-11-01 NOTE — Assessment & Plan Note (Signed)
Summary: f92m  Medications Added VITAMIN D 1000 UNIT  TABS (CHOLECALCIFEROL) once daily VITAMIN D3 400 UNIT TABS (CHOLECALCIFEROL) once daily      Allergies Added: NKDA  Visit Type:  Follow-up Referring Provider:  Thompson Grayer, MD Primary Provider:  Jeanann Lewandowsky, MD  CC:  atrial fibrillation.  History of Present Illness: The patient is seen for followup of atrial fibrillation.  She had a syncopal episode in the past.  She has a pacemaker in place and she has felt well.  She has good LV function.  There's been no chest pain.  There is moderate pulmonary hypertension.  Current Medications (verified): 1)  Metoprolol Tartrate 100 Mg Tabs (Metoprolol Tartrate) .... Take One Tablet By Mouth Twice A Day 2)  Warfarin Sodium 5 Mg Tabs (Warfarin Sodium) .... Take As Directed By Coumadin Clinic 3)  Adult Aspirin Ec Low Strength 81 Mg Tbec (Aspirin) .... Once Daily 4)  Vitamin B-12 500 Mcg Tabs (Cyanocobalamin) .... Once Daily 5)  Vitamin B-6 Cr 200 Mg Cr-Tabs (Pyridoxine Hcl) .... Once Daily 6)  Metformin Hcl 500 Mg Tabs (Metformin Hcl) .Marland Kitchen.. 1 1/2 Tab By Mouth Two Times A Day 7)  Diovan 160 Mg Tabs (Valsartan) .... Once Daily 8)  Cardizem Cd 240 Mg Xr24h-Cap (Diltiazem Hcl Coated Beads) .Marland Kitchen.. 1 Cap By Mouth Daily 9)  Furosemide 20 Mg Tabs (Furosemide) .... Take One Tablet By Mouth Daily. 10)  Flecainide Acetate 100 Mg Tabs (Flecainide Acetate) .... Take 1 Tablet By Mouth Two Times A Day 11)  Anastrozole 1 Mg Tabs (Anastrozole) .... Take 1 By Mouth Daily 12)  Oyst-Cal D 250-125 Mg-Unit Tabs (Calcium Carbonate-Vitamin D) .... Take 1 By Mouth Daily 13)  Vitamin D 1000 Unit  Tabs (Cholecalciferol) .... Once Daily 14)  Vitamin D3 400 Unit Tabs (Cholecalciferol) .... Once Daily  Allergies (verified): No Known Drug Allergies  Past History:  Past Medical History: Myoview ..no ischemia..EF 65% ...08/08/2009 PAROXYSMAL ATRIAL FIBRILLATION (ICD-427.31) Pacemaker ,  permanent....07/2009.Marland KitchenMarland KitchenMedtronic.Marland KitchenMarland KitchenBrady-tachy with long pauses and syncope  Dr. Rayann Heman Syncope....PTVDP Atrial flutter  07/2009.Marland KitchenMarland Kitchenflecainide Flecainide Rx Vulome overload...mild...07/2009 EF  60%  echo  ..06/2009 Pulmonary hypertension .Marland Kitchen50mmHg...echo...06/2009 MR  mild...echo...06/2009 HYPERTENSION (ICD-401.9) DVT (ICD-453.40)  .and Pul Embolus in the past Dyspnea   --Myoview negative 7/09 Carotid artery disease  ..[id number] RICA, [id number] LICA  doppler .Marland KitchenMarland Kitchen5/2009 PULMONARY EMBOLISM, HX OF (ICD-V12.51) - 2006   --Chest CT 2009 No PE DM (ICD-250.00) MEDIASTINAL LYMPHADENOPATHY (ICD-785.6) ALLERGIC RHINITIS (ICD-477.9) THYROID NODULE, HX OF (ICD-V12.2)...non-neoplastic 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 Coumadin Rx Breast cancer  Review of Systems       Patient denies fever, chills, headache, sweats, rash, change in vision, change in hearing, chest pain, cough, nausea vomiting, urinary symptoms.  All other systems are reviewed and are negative.  Vital Signs:  Patient profile:   77 year old female Height:      72 inches Weight:      233 pounds BMI:     31.71 Pulse rate:   72 / minute BP sitting:   132 / 64  (left arm) Cuff size:   regular  Vitals Entered By: Mignon Pine, RMA (August 31, 2010 8:10 AM)  Physical Exam  General:  patient looks quite good. Eyes:  no xanthelasma. Neck:  no jugular venous distention. Lungs:  lungs are clear.  Respiratory effort is unlabored. Heart:  cardiac exam reveals S1-S2.  No clicks or significant murmurs. Abdomen:  abdomen is soft. Extremities:  no peripheral edema. Psych:  patient is oriented to person time and  place.  Affect is normal.   PPM Specifications Following MD:  Vanna Scotland. Olevia Perches, MD     PPM Vendor:  Medtronic     PPM Model Number:  [id number]     PPM Serial Number:  [id number] [facility] PPM DOI:  06/30/2009     PPM Implanting MD:  Vanna Scotland. Olevia Perches, MD  Lead 1    Location: RA     DOI: 06/30/2009     Model #: [id number]     Serial #: [id number]     Status:  active Lead 2    Location: RV     DOI: 06/30/2009     Model #: [id number]     Serial #[id number]     Status: active  Magnet Response Rate:  BOL 85 ERI  65  Indications:  Tachy-brady syndrome   PPM Follow Up Pacer Dependent:  Yes      Episodes Coumadin:  Yes  Parameters Mode:  MVP (R)     Lower Rate Limit:  60     Upper Rate Limit:  130 Paced AV Delay:  180     Sensed AV Delay:  150 Rate Response Parameters:  ADL response-3, Exertion response-3, Threshold-Med/Low, Acceleration-30 seconds, Deceleration-Exercise  Impression & Recommendations:  Problem # 1:  SHORTNESS OF BREATH (ICD-786.05)  Her updated medication list for this problem includes:    Metoprolol Tartrate 100 Mg Tabs (Metoprolol tartrate) .Marland Kitchen... Take one tablet by mouth twice a day    Adult Aspirin Ec Low Strength 81 Mg Tbec (Aspirin) ..... Once daily    Diovan 160 Mg Tabs (Valsartan) ..... Once daily    Cardizem Cd 240 Mg Xr24h-cap (Diltiazem hcl coated beads) .Marland Kitchen... 1 cap by mouth daily    Furosemide 20 Mg Tabs (Furosemide) .Marland Kitchen... Take one tablet by mouth daily. The patient is not having any shortness of breath.  No further workup.  Problem # 2:  PACEMAKER, PERMANENT (ICD-V45.01) Pacemaker is working well.  EKGs done today and reviewed by me.  There is atrial pacing.  Problem # 3:  ATRIAL FIBRILLATION, HX OF (ICD-V12.59)  Her updated medication list for this problem includes:    Metoprolol Tartrate 100 Mg Tabs (Metoprolol tartrate) .Marland Kitchen... Take one tablet by mouth twice a day    Warfarin Sodium 5 Mg Tabs (Warfarin sodium) .Marland Kitchen... Take as directed by coumadin clinic    Adult Aspirin Ec Low Strength 81 Mg Tbec (Aspirin) ..... Once daily    Cardizem Cd 240 Mg Xr24h-cap (Diltiazem hcl coated beads) .Marland Kitchen... 1 cap by mouth daily    Flecainide Acetate 100 Mg Tabs (Flecainide acetate) .Marland Kitchen... Take 1 tablet by mouth two times a day The patient is not having asymptomatic atrial fibrillation.  Today's rhythm is sinus.  She is on  Coumadin.  Problem # 4:  * FLECAINIDE RX Flecainide level was 0.4 in April, 2011.  This is a good level.  No change.  Problem # 5:  CAROTID BRUIT, RIGHT (ICD-785.9) We know from 2009 and the patient has moderate carotid artery disease.  We will check to see when the followup Doppler is to be done.  Other Orders: EKG w/ Interpretation (93000) Carotid Duplex (Carotid Duplex)  Patient Instructions: 1)  Your physician has requested that you have a carotid duplex. This test is an ultrasound of the carotid arteries in your neck. It looks at blood flow through these arteries that supply the brain with blood. Allow one hour for this exam. There are no restrictions or special instructions. 2)  Your physician wants you to follow-up in: 1 year.  You will receive a reminder letter in the mail two months in advance. If you don't receive a letter, please call our office to schedule the follow-up appointment.

## 2010-11-02 NOTE — Letter (Signed)
Summary: [place]   Imported By: Marilynne Drivers 07/16/2010 07:53:46  _____________________________________________________________________  External Attachment:    Type:   Image     Comment:   External Document

## 2010-11-06 ENCOUNTER — Other Ambulatory Visit: Payer: Self-pay | Admitting: Surgery

## 2010-11-06 DIAGNOSIS — Z1231 Encounter for screening mammogram for malignant neoplasm of breast: Secondary | ICD-10-CM

## 2010-11-08 ENCOUNTER — Encounter: Payer: Self-pay | Admitting: Internal Medicine

## 2010-11-08 ENCOUNTER — Ambulatory Visit (INDEPENDENT_AMBULATORY_CARE_PROVIDER_SITE_OTHER): Payer: PRIVATE HEALTH INSURANCE | Admitting: Internal Medicine

## 2010-11-08 DIAGNOSIS — I10 Essential (primary) hypertension: Secondary | ICD-10-CM

## 2010-11-08 DIAGNOSIS — I495 Sick sinus syndrome: Secondary | ICD-10-CM

## 2010-11-08 DIAGNOSIS — I4891 Unspecified atrial fibrillation: Secondary | ICD-10-CM

## 2010-11-15 ENCOUNTER — Encounter: Payer: Self-pay | Admitting: Cardiology

## 2010-11-15 ENCOUNTER — Other Ambulatory Visit: Payer: Self-pay | Admitting: Oncology

## 2010-11-15 ENCOUNTER — Encounter (HOSPITAL_BASED_OUTPATIENT_CLINIC_OR_DEPARTMENT_OTHER): Payer: PRIVATE HEALTH INSURANCE | Admitting: Oncology

## 2010-11-15 DIAGNOSIS — Z86718 Personal history of other venous thrombosis and embolism: Secondary | ICD-10-CM

## 2010-11-15 DIAGNOSIS — C50419 Malignant neoplasm of upper-outer quadrant of unspecified female breast: Secondary | ICD-10-CM

## 2010-11-15 DIAGNOSIS — C50919 Malignant neoplasm of unspecified site of unspecified female breast: Secondary | ICD-10-CM

## 2010-11-15 DIAGNOSIS — Z170 Estrogen receptor positive status [ER+]: Secondary | ICD-10-CM

## 2010-11-15 DIAGNOSIS — Z7901 Long term (current) use of anticoagulants: Secondary | ICD-10-CM

## 2010-11-15 LAB — COMPREHENSIVE METABOLIC PANEL
ALT: 14 U/L (ref 0–35)
AST: 18 U/L (ref 0–37)
Albumin: 4.1 g/dL (ref 3.5–5.2)
CO2: 26 mEq/L (ref 19–32)
Calcium: 9.3 mg/dL (ref 8.4–10.5)
Chloride: 103 mEq/L (ref 96–112)
Creatinine, Ser: 1.13 mg/dL (ref 0.40–1.20)
Potassium: 4.2 mEq/L (ref 3.5–5.3)
Total Protein: 7.3 g/dL (ref 6.0–8.3)

## 2010-11-15 LAB — CBC WITH DIFFERENTIAL/PLATELET
BASO%: 1 % (ref 0.0–2.0)
EOS%: 4.3 % (ref 0.0–7.0)
HCT: 37.5 % (ref 34.8–46.6)
HGB: 12.7 g/dL (ref 11.6–15.9)
MCH: 30.4 pg (ref 25.1–34.0)
MCHC: 33.8 g/dL (ref 31.5–36.0)
MONO#: 0.4 10*3/uL (ref 0.1–0.9)
NEUT%: 59.8 % (ref 38.4–76.8)
RDW: 13.5 % (ref 11.2–14.5)
WBC: 4.1 10*3/uL (ref 3.9–10.3)
lymph#: 1 10*3/uL (ref 0.9–3.3)

## 2010-11-15 NOTE — Assessment & Plan Note (Signed)
Summary: f1y/per checkout 11-13-09/mdt ppm/former [facility] pt/hm/kwb appt...  Medications Added METFORMIN HCL 500 MG TABS (METFORMIN HCL) 1 1/2 tab by mouth once daily      Allergies Added: NKDA  Visit Type:  Follow-up Referring Provider:  Thompson Grayer, MD Primary Provider:  Jeanann Lewandowsky, MD   History of Present Illness: The patient presents today for routine electrophysiology followup. She reports doing very well since having her pacemaker implanted by Dr Thereasa Solo 08/01/09.  She has had no further syncope.  She remains active and is unaware of symptomatic afib.   The patient denies symptoms of palpitations, chest pain, shortness of breath, orthopnea, PND, lower extremity edema, dizziness, presyncope, syncope, or neurologic sequela. The patient is tolerating medications without difficulties and is otherwise without complaint today.   Current Medications (verified): 1)  Metoprolol Tartrate 100 Mg Tabs (Metoprolol Tartrate) .... Take One Tablet By Mouth Twice A Day 2)  Warfarin Sodium 5 Mg Tabs (Warfarin Sodium) .... Take As Directed By Coumadin Clinic 3)  Adult Aspirin Ec Low Strength 81 Mg Tbec (Aspirin) .... Once Daily 4)  Vitamin B-12 500 Mcg Tabs (Cyanocobalamin) .... Once Daily 5)  Vitamin B-6 Cr 200 Mg Cr-Tabs (Pyridoxine Hcl) .... Once Daily 6)  Metformin Hcl 500 Mg Tabs (Metformin Hcl) .Marland Kitchen.. 1 1/2 Tab By Mouth Once Daily 7)  Diovan 160 Mg Tabs (Valsartan) .... Once Daily 8)  Cardizem Cd 240 Mg Xr24h-Cap (Diltiazem Hcl Coated Beads) .Marland Kitchen.. 1 Cap By Mouth Daily 9)  Furosemide 20 Mg Tabs (Furosemide) .... Take One Tablet By Mouth Daily. 10)  Flecainide Acetate 100 Mg Tabs (Flecainide Acetate) .... Take 1 Tablet By Mouth Two Times A Day 11)  Anastrozole 1 Mg Tabs (Anastrozole) .... Take 1 By Mouth Daily 12)  Oyst-Cal D 250-125 Mg-Unit Tabs (Calcium Carbonate-Vitamin D) .... Take 1 By Mouth Daily 13)  Vitamin D 1000 Unit  Tabs (Cholecalciferol) .... Once Daily 14)  Vitamin D3 400 Unit Tabs  (Cholecalciferol) .... Once Daily  Allergies (verified): No Known Drug Allergies  Past History:  Past Medical History: Reviewed history from 08/31/2010 and no changes required. Myoview ..no ischemia..EF 65% ...08/08/2009 PAROXYSMAL ATRIAL FIBRILLATION (ICD-427.31) Pacemaker , permanent....07/2009.Marland KitchenMarland KitchenMedtronic.Marland KitchenMarland KitchenBrady-tachy with long pauses and syncope  Dr. Rayann Heman Syncope....PTVDP Atrial flutter  07/2009.Marland KitchenMarland Kitchenflecainide Flecainide Rx Vulome overload...mild...07/2009 EF  60%  echo  ..06/2009 Pulmonary hypertension .Marland Kitchen79mmHg...echo...06/2009 MR  mild...echo...06/2009 HYPERTENSION (ICD-401.9) DVT (ICD-453.40)  .and Pul Embolus in the past Dyspnea   --Myoview negative 7/09 Carotid artery disease  ..[id number] RICA, 123456 LICA  doppler .Marland KitchenMarland Kitchen5/2009 PULMONARY EMBOLISM, HX OF (ICD-V12.51) - 2006   --Chest CT 2009 No PE DM (ICD-250.00) MEDIASTINAL LYMPHADENOPATHY (ICD-785.6) ALLERGIC RHINITIS (ICD-477.9) THYROID NODULE, HX OF (ICD-V12.2)...non-neoplastic 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 Coumadin Rx Breast cancer  Past Surgical History: Reviewed history from 07/19/2009 and no changes required. Back surgery Hysterectomy Left ankle surgery  Social History: Reviewed history from 07/19/2009 and no changes required.  She resides in [place] by herself. Her daughter and son  are close by. She is retired from [employer], and currently she is using a walker to assist with ambulation with her fractured foot. She quit smoking in 2001. She denies any alcohol, drugs or herbal medications.   Review of Systems       All systems are reviewed and negative except as listed in the HPI.   Vital Signs:  Patient profile:   77 year old female Height:      72 inches Weight:      230 pounds BMI:  31.31 Pulse rate:   72 / minute BP sitting:   130 / 70  (left arm)  Vitals Entered By: Margaretmary Bayley CMA (November 08, 2010 8:33 AM)  Physical Exam  General:  NAD Head:  normocephalic and atraumatic Eyes:  PERRLA/EOM  intact; conjunctiva and lids normal. Mouth:  Teeth, gums and palate normal. Oral mucosa normal. Neck:  no jugular venous distention. Chest Wall:  pacemaker site is well healed Lungs:  Clear bilaterally to auscultation and percussion. Heart:  Non-displaced PMI, chest non-tender; regular rate and rhythm, S1, S2 without murmurs, rubs or gallops. Carotid upstroke normal, no bruit. Normal abdominal aortic size, no bruits. Femorals normal pulses, no bruits. Pedals normal pulses. No edema, no varicosities. Abdomen:  Bowel sounds positive; abdomen soft and non-tender without masses, organomegaly, or hernias noted. No hepatosplenomegaly. Msk:  Back normal, normal gait. Muscle strength and tone normal. Extremities:  No clubbing or cyanosis. Neurologic:  Alert and oriented x 3. Skin:  Intact without lesions or rashes.   PPM Specifications Following MD:  Thompson Grayer, MD     PPM Vendor:  [employer]tronic     PPM Model Number:  [id number]     PPM Serial Number:  [id number] [facility] PPM DOI:  06/30/2009     PPM Implanting MD:  Vanna Scotland. Olevia Perches, MD  Lead 1    Location: RA     DOI: 06/30/2009     Model #: [id number]     Serial #: [id number]     Status: active Lead 2    Location: RV     DOI: 06/30/2009     Model #: [id number]     Serial #[id number]     Status: active  Magnet Response Rate:  BOL 85 ERI  65  Indications:  Tachy-brady syndrome   PPM Follow Up Battery Voltage:  2.79 V     Battery Est. Longevity:  12.5 yrs     Pacer Dependent:  Yes       PPM Device Measurements Atrium  Amplitude: PACED mV, Impedance: 483 ohms, Threshold: 1.00 V at 0.40 msec Right Ventricle  Amplitude: 11.20 mV, Impedance: 485 ohms, Threshold: 0.750 V at 0.40 msec  Episodes MS Episodes:  10     Percent Mode Switch:  <0.1%     Coumadin:  Yes Ventricular High Rate:  0     Atrial Pacing:  99.3%     Ventricular Pacing:  0.2%  Parameters Mode:  MVP (R)     Lower Rate Limit:  60     Upper Rate Limit:  130 Paced AV Delay:  180     Sensed AV Delay:   150 Rate Response Parameters:  ADL response-3, Exertion response-3, Threshold-Med/Low, Acceleration-30 seconds, Deceleration-Exercise Next Cardiology Appt Due:  05/01/2011 Tech Comments:  10 MODE SWITCHES--ALL LESS THAN 1 MINUTE.  1 VHR EPISODE LASTING 8 BEATS. NORMAL DEVICE FUNCTION. CHANGED RA OUTPUT FROM 1.5 TO 2.0 V AND RV OUTPUT FROM 2.0 TO 2.5 V.  PT PREFERS OV RATHER THAN CARELINK. Shelly Bombard  November 08, 2010 9:01 AM MD Comments:  agree,  appears to be maintaining sinus with flecainide, normal ppm function,  farfield sensing from atrial lead occasionally, will follow     Impression & Recommendations:  Problem # 1:  SYNCOPE (ICD-780.2) resolved s/p PPM for tachy/brady syndrome as above  Problem # 2:  ATRIAL FIBRILLATION, HX OF (ICD-V12.59) well controlled with flecainide CHADS2 score is at least 3 (DM,HTN, age).  She should therefore continue coumadin longterm.  INRs appears stable.  we will stop aspirin today to avoid excessive bleeding risks  Problem # 3:  HYPERTENSION (ICD-401.9) stable no changes  Patient Instructions: 1)  return to device clinic in 6 months 2)  follow-up with Dr Ron Parker as scheduled

## 2010-11-22 DIAGNOSIS — I82409 Acute embolism and thrombosis of unspecified deep veins of unspecified lower extremity: Secondary | ICD-10-CM

## 2010-11-22 DIAGNOSIS — I2699 Other pulmonary embolism without acute cor pulmonale: Secondary | ICD-10-CM

## 2010-11-22 DIAGNOSIS — Z86718 Personal history of other venous thrombosis and embolism: Secondary | ICD-10-CM

## 2010-11-22 DIAGNOSIS — I4892 Unspecified atrial flutter: Secondary | ICD-10-CM

## 2010-11-22 DIAGNOSIS — Z8679 Personal history of other diseases of the circulatory system: Secondary | ICD-10-CM

## 2010-11-23 ENCOUNTER — Encounter (INDEPENDENT_AMBULATORY_CARE_PROVIDER_SITE_OTHER): Payer: PRIVATE HEALTH INSURANCE

## 2010-11-23 ENCOUNTER — Encounter: Payer: Self-pay | Admitting: Cardiology

## 2010-11-23 DIAGNOSIS — I2699 Other pulmonary embolism without acute cor pulmonale: Secondary | ICD-10-CM

## 2010-11-23 DIAGNOSIS — Z7901 Long term (current) use of anticoagulants: Secondary | ICD-10-CM

## 2010-11-23 LAB — CONVERTED CEMR LAB: POC INR: 3.9

## 2010-11-27 NOTE — Medication Information (Signed)
Summary: rov/ewj   Anticoagulant Therapy  Managed by: Alycia Rossetti, PharmD Referring MD: Dola Argyle, MD PCP: Jeanann Lewandowsky, MD Supervising MD: Burt Knack MD, Legrand Como Indication 1: Pulmonary embolus (ICD-415.19) Indication 2: Gastrointestinal Hemorrhage (ICD-578) Lab Used: [facility] Site: [facility] INR POC 3.9 INR RANGE 2 - 3  Dietary changes: no    Health status changes: no    Bleeding/hemorrhagic complications: no    Recent/future hospitalizations: no    Any changes in medication regimen? no    Recent/future dental: no  Any missed doses?: no       Is patient compliant with meds? yes       Allergies: No Known Drug Allergies  Anticoagulation Management History:      Positive risk factors for bleeding include an age of 75 years or older and presence of serious comorbidities.  The bleeding index is 'intermediate risk'.  Positive CHADS2 values include History of HTN, Age > 75 years old, and History of Diabetes.  The start date was 02/12/2005.  Her last INR was 4.1.  Anticoagulation responsible provider: Burt Knack MD, Legrand Como.  INR POC: 3.9.  Cuvette Lot#: [id number].  Exp: 10/2011.    Anticoagulation Management Assessment/Plan:      The patient's current anticoagulation dose is Warfarin sodium 5 mg tabs: take as directed by coumadin clinic.  The target INR is 2 - 3.  The next INR is due 12/07/2010.  Anticoagulation instructions were given to patient.  Results were reviewed/authorized by Alycia Rossetti, PharmD.  She was notified by Alycia Rossetti PharmD.         Prior Anticoagulation Instructions: INR 2.5 Continue taking 1/2 tablet every day except 1 tablet on Sundays, Tuesdays, and Thursdays. Recheck in 4 weeks.  Current Anticoagulation Instructions: Hold tomorrow's (Saturdays) dose then change regimen to take 1/2 tablet (2.5 mg) daily EXCEPT for 1 tablet (5 mg) on Sundays and Thursdays only.  INR 3.9

## 2010-12-06 NOTE — Cardiovascular Report (Signed)
Summary: Office Visit   Office Visit   Imported By: Sallee Provencal 11/26/2010 16:04:53  _____________________________________________________________________  External Attachment:    Type:   Image     Comment:   External Document

## 2010-12-07 ENCOUNTER — Encounter: Payer: Self-pay | Admitting: Cardiovascular Disease

## 2010-12-07 ENCOUNTER — Encounter (INDEPENDENT_AMBULATORY_CARE_PROVIDER_SITE_OTHER): Payer: PRIVATE HEALTH INSURANCE

## 2010-12-07 DIAGNOSIS — Z7901 Long term (current) use of anticoagulants: Secondary | ICD-10-CM

## 2010-12-07 DIAGNOSIS — I2699 Other pulmonary embolism without acute cor pulmonale: Secondary | ICD-10-CM

## 2010-12-07 LAB — CONVERTED CEMR LAB: POC INR: 2.4

## 2010-12-11 NOTE — Medication Information (Signed)
Summary: rov/ewj   Anticoagulant Therapy  Managed by: Maryanna Shape, PharmD Referring MD: Dola Argyle, MD PCP: Jeanann Lewandowsky, MD Supervising MD: Johnsie Cancel MD, Collier Salina Indication 1: Pulmonary embolus (ICD-415.19) Indication 2: Gastrointestinal Hemorrhage (ICD-578) Lab Used: [facility] Site: [facility] INR POC 2.4 INR RANGE 2 - 3  Dietary changes: no    Health status changes: no    Bleeding/hemorrhagic complications: no    Recent/future hospitalizations: no    Any changes in medication regimen? no    Recent/future dental: no  Any missed doses?: no       Is patient compliant with meds? yes       Allergies: No Known Drug Allergies  Anticoagulation Management History:      Positive risk factors for bleeding include an age of 75 years or older and presence of serious comorbidities.  The bleeding index is 'intermediate risk'.  Positive CHADS2 values include History of HTN, Age > 13 years old, and History of Diabetes.  The start date was 02/12/2005.  Her last INR was 4.1.  Anticoagulation responsible provider: Johnsie Cancel MD, Collier Salina.  INR POC: 2.4.  Cuvette Lot#: [id number].  Exp: 10/2011.    Anticoagulation Management Assessment/Plan:      The patient's current anticoagulation dose is Warfarin sodium 5 mg tabs: take as directed by coumadin clinic.  The target INR is 2 - 3.  The next INR is due 01/04/2011.  Anticoagulation instructions were given to patient.  Results were reviewed/authorized by Maryanna Shape, PharmD.         Prior Anticoagulation Instructions: Hold tomorrow's (Saturdays) dose then change regimen to take 1/2 tablet (2.5 mg) daily EXCEPT for 1 tablet (5 mg) on Sundays and Thursdays only.  INR 3.9  Current Anticoagulation Instructions: INR 2.4 The patient is to continue with the same dose of coumadin.  This dosage includes:  1 tablet (5mg ) on Sun and Thu 1/2 tablet (2.5mg ) on Mon, Tue, Wed, Fri and Sat. Recheck INR in 4 weeks.

## 2010-12-13 ENCOUNTER — Emergency Department (HOSPITAL_COMMUNITY)
Admission: EM | Admit: 2010-12-13 | Discharge: 2010-12-13 | Disposition: A | Discharge status: Home / Self Care | Payer: No Typology Code available for payment source | Attending: Emergency Medicine | Admitting: Emergency Medicine

## 2010-12-13 DIAGNOSIS — Z950 Presence of cardiac pacemaker: Secondary | ICD-10-CM | POA: Insufficient documentation

## 2010-12-13 DIAGNOSIS — X58XXXA Exposure to other specified factors, initial encounter: Secondary | ICD-10-CM | POA: Insufficient documentation

## 2010-12-13 DIAGNOSIS — M545 Low back pain, unspecified: Secondary | ICD-10-CM | POA: Insufficient documentation

## 2010-12-13 DIAGNOSIS — K5900 Constipation, unspecified: Secondary | ICD-10-CM | POA: Insufficient documentation

## 2010-12-13 DIAGNOSIS — Z853 Personal history of malignant neoplasm of breast: Secondary | ICD-10-CM | POA: Insufficient documentation

## 2010-12-13 DIAGNOSIS — Z7901 Long term (current) use of anticoagulants: Secondary | ICD-10-CM | POA: Insufficient documentation

## 2010-12-13 DIAGNOSIS — I10 Essential (primary) hypertension: Secondary | ICD-10-CM | POA: Insufficient documentation

## 2010-12-13 DIAGNOSIS — S335XXA Sprain of ligaments of lumbar spine, initial encounter: Secondary | ICD-10-CM | POA: Insufficient documentation

## 2010-12-13 DIAGNOSIS — Z86718 Personal history of other venous thrombosis and embolism: Secondary | ICD-10-CM | POA: Insufficient documentation

## 2010-12-13 DIAGNOSIS — E119 Type 2 diabetes mellitus without complications: Secondary | ICD-10-CM | POA: Insufficient documentation

## 2010-12-13 DIAGNOSIS — Z79899 Other long term (current) drug therapy: Secondary | ICD-10-CM | POA: Insufficient documentation

## 2010-12-13 DIAGNOSIS — Z7982 Long term (current) use of aspirin: Secondary | ICD-10-CM | POA: Insufficient documentation

## 2010-12-15 LAB — CBC
HCT: 38 % (ref 36.0–46.0)
Hemoglobin: 12.9 g/dL (ref 12.0–15.0)
RBC: 4.23 MIL/uL (ref 3.87–5.11)

## 2010-12-15 LAB — URINE MICROSCOPIC-ADD ON

## 2010-12-15 LAB — COMPREHENSIVE METABOLIC PANEL
ALT: 16 U/L (ref 0–35)
AST: 23 U/L (ref 0–37)
Alkaline Phosphatase: 86 U/L (ref 39–117)
CO2: 27 mEq/L (ref 19–32)
Chloride: 104 mEq/L (ref 96–112)
GFR calc Af Amer: >60 mL/min (ref >60)
GFR calc non Af Amer: 57 mL/min — ABNORMAL LOW (ref >60)
Sodium: 138 mEq/L (ref 135–145)
Total Bilirubin: 0.6 mg/dL (ref 0.3–1.2)

## 2010-12-15 LAB — URINALYSIS, ROUTINE W REFLEX MICROSCOPIC
Nitrite: POSITIVE — AB
Specific Gravity, Urine: 1.012 (ref 1.005–1.030)
Urobilinogen, UA: 1 mg/dL (ref 0.0–1.0)
pH: 6.5 (ref 5.0–8.0)

## 2010-12-15 LAB — DIFFERENTIAL
Eosinophils Absolute: 0.1 10*3/uL (ref 0.0–0.7)
Eosinophils Relative: 3 % (ref 0–5)
Lymphocytes Relative: 21 % (ref 12–46)
Lymphs Abs: 1 10*3/uL (ref 0.7–4.0)
Monocytes Relative: 11 % (ref 3–12)
Neutrophils Relative %: 64 % (ref 43–77)

## 2010-12-15 LAB — URINE CULTURE

## 2010-12-15 LAB — PROTIME-INR
INR: 1.72 — ABNORMAL HIGH (ref 0.00–1.49)
Prothrombin Time: 20 seconds — ABNORMAL HIGH (ref 11.6–15.2)

## 2010-12-17 LAB — CBC
HCT: 36.4 % (ref 36.0–46.0)
Hemoglobin: 12.7 g/dL (ref 12.0–15.0)
Hemoglobin: 12.7 g/dL (ref 12.0–15.0)
Platelets: 333 10*3/uL (ref 150–400)
RBC: 4.1 MIL/uL (ref 3.87–5.11)
RDW: 15.1 % (ref 11.5–15.5)
WBC: 4.5 10*3/uL (ref 4.0–10.5)
WBC: 5.1 10*3/uL (ref 4.0–10.5)

## 2010-12-17 LAB — BASIC METABOLIC PANEL
Calcium: 9.4 mg/dL (ref 8.4–10.5)
GFR calc Af Amer: 57 mL/min — ABNORMAL LOW (ref >60)
GFR calc non Af Amer: 47 mL/min — ABNORMAL LOW (ref >60)
Glucose, Bld: 163 mg/dL — ABNORMAL HIGH (ref 70–99)
Sodium: 138 mEq/L (ref 135–145)

## 2010-12-17 LAB — DIFFERENTIAL
Basophils Absolute: 0 10*3/uL (ref 0.0–0.1)
Basophils Absolute: 0.1 10*3/uL (ref 0.0–0.1)
Lymphocytes Relative: 18 % (ref 12–46)
Lymphocytes Relative: 24 % (ref 12–46)
Lymphs Abs: 1.1 10*3/uL (ref 0.7–4.0)
Monocytes Absolute: 0.4 10*3/uL (ref 0.1–1.0)
Monocytes Absolute: 0.5 10*3/uL (ref 0.1–1.0)
Monocytes Relative: 10 % (ref 3–12)
Neutro Abs: 2.8 10*3/uL (ref 1.7–7.7)
Neutro Abs: 3.6 10*3/uL (ref 1.7–7.7)

## 2010-12-17 LAB — CULTURE, ROUTINE-ABSCESS: Culture: NO GROWTH

## 2010-12-17 LAB — PROTIME-INR: Prothrombin Time: 27 seconds — ABNORMAL HIGH (ref 11.6–15.2)

## 2010-12-18 LAB — APTT: aPTT: 39 seconds — ABNORMAL HIGH (ref 24–37)

## 2010-12-18 LAB — BASIC METABOLIC PANEL
Calcium: 9.6 mg/dL (ref 8.4–10.5)
GFR calc Af Amer: >60 mL/min (ref >60)
GFR calc non Af Amer: 54 mL/min — ABNORMAL LOW (ref >60)
Sodium: 138 mEq/L (ref 135–145)

## 2010-12-18 LAB — PROTIME-INR: INR: 2.29 — ABNORMAL HIGH (ref 0.00–1.49)

## 2010-12-18 NOTE — Progress Notes (Signed)
Summary: [facility]: Office Visit  [facility]: Office Visit   Imported By: Roddie Mc 12/07/2010 13:31:36  _____________________________________________________________________  External Attachment:    Type:   Image     Comment:   External Document

## 2011-01-02 LAB — BASIC METABOLIC PANEL
Chloride: 106 mEq/L (ref 96–112)
GFR calc Af Amer: >60 mL/min (ref >60)
Potassium: 4 mEq/L (ref 3.5–5.1)

## 2011-01-02 LAB — GLUCOSE, CAPILLARY
Glucose-Capillary: 100 mg/dL — ABNORMAL HIGH (ref 70–99)
Glucose-Capillary: 123 mg/dL — ABNORMAL HIGH (ref 70–99)
Glucose-Capillary: 128 mg/dL — ABNORMAL HIGH (ref 70–99)
Glucose-Capillary: 130 mg/dL — ABNORMAL HIGH (ref 70–99)
Glucose-Capillary: 140 mg/dL — ABNORMAL HIGH (ref 70–99)
Glucose-Capillary: 146 mg/dL — ABNORMAL HIGH (ref 70–99)
Glucose-Capillary: 146 mg/dL — ABNORMAL HIGH (ref 70–99)
Glucose-Capillary: 165 mg/dL — ABNORMAL HIGH (ref 70–99)

## 2011-01-02 LAB — CBC
HCT: 36.2 % (ref 36.0–46.0)
MCV: 79.9 fL (ref 78.0–100.0)
RBC: 4.53 MIL/uL (ref 3.87–5.11)
WBC: 4.2 10*3/uL (ref 4.0–10.5)

## 2011-01-02 LAB — PROTIME-INR
INR: 1.28 (ref 0.00–1.49)
INR: 1.4 (ref 0.00–1.49)
INR: 1.51 — ABNORMAL HIGH (ref 0.00–1.49)
Prothrombin Time: 22.2 seconds — ABNORMAL HIGH (ref 11.6–15.2)

## 2011-01-02 LAB — BRAIN NATRIURETIC PEPTIDE: Pro B Natriuretic peptide (BNP): 258 pg/mL — ABNORMAL HIGH (ref 0.0–100.0)

## 2011-01-03 LAB — BASIC METABOLIC PANEL
CO2: 25 mEq/L (ref 19–32)
Glucose, Bld: 154 mg/dL — ABNORMAL HIGH (ref 70–99)
Potassium: 4.7 mEq/L (ref 3.5–5.1)
Sodium: 137 mEq/L (ref 135–145)

## 2011-01-03 LAB — CBC
HCT: 38.1 % (ref 36.0–46.0)
Hemoglobin: 12.3 g/dL (ref 12.0–15.0)
MCHC: 32.3 g/dL (ref 30.0–36.0)
RDW: 18.7 % — ABNORMAL HIGH (ref 11.5–15.5)

## 2011-01-03 LAB — DIFFERENTIAL
Basophils Absolute: 0 10*3/uL (ref 0.0–0.1)
Basophils Relative: 1 % (ref 0–1)
Eosinophils Relative: 2 % (ref 0–5)
Monocytes Absolute: 0.5 10*3/uL (ref 0.1–1.0)

## 2011-01-03 LAB — CK TOTAL AND CKMB (NOT AT ARMC): Total CK: 104 U/L (ref 7–177)

## 2011-01-04 ENCOUNTER — Encounter: Payer: PRIVATE HEALTH INSURANCE | Admitting: *Deleted

## 2011-01-07 ENCOUNTER — Ambulatory Visit (INDEPENDENT_AMBULATORY_CARE_PROVIDER_SITE_OTHER): Payer: No Typology Code available for payment source | Admitting: *Deleted

## 2011-01-07 DIAGNOSIS — Z7901 Long term (current) use of anticoagulants: Secondary | ICD-10-CM

## 2011-01-07 DIAGNOSIS — I4892 Unspecified atrial flutter: Secondary | ICD-10-CM

## 2011-01-07 DIAGNOSIS — Z86718 Personal history of other venous thrombosis and embolism: Secondary | ICD-10-CM

## 2011-01-07 DIAGNOSIS — I2699 Other pulmonary embolism without acute cor pulmonale: Secondary | ICD-10-CM

## 2011-01-07 DIAGNOSIS — Z8679 Personal history of other diseases of the circulatory system: Secondary | ICD-10-CM

## 2011-01-07 DIAGNOSIS — I82409 Acute embolism and thrombosis of unspecified deep veins of unspecified lower extremity: Secondary | ICD-10-CM

## 2011-01-07 LAB — POCT INR: INR: 3

## 2011-01-07 NOTE — Patient Instructions (Signed)
INR 3.0 Continue taking 1/2 tablet (2.5 mg) on Sundays and Thursdays, and take 1 tablet (5 mg) all of the remaining days. Recheck in 4 weeks.

## 2011-01-14 ENCOUNTER — Ambulatory Visit
Admission: RE | Admit: 2011-01-14 | Discharge: 2011-01-14 | Disposition: A | Discharge status: Home / Self Care | Payer: No Typology Code available for payment source | Source: Ambulatory Visit | Attending: Surgery | Admitting: Surgery

## 2011-01-14 DIAGNOSIS — Z1231 Encounter for screening mammogram for malignant neoplasm of breast: Secondary | ICD-10-CM

## 2011-02-04 ENCOUNTER — Ambulatory Visit (INDEPENDENT_AMBULATORY_CARE_PROVIDER_SITE_OTHER): Payer: Medicare Other | Admitting: *Deleted

## 2011-02-04 ENCOUNTER — Other Ambulatory Visit: Payer: Self-pay | Admitting: *Deleted

## 2011-02-04 DIAGNOSIS — I2699 Other pulmonary embolism without acute cor pulmonale: Secondary | ICD-10-CM

## 2011-02-04 DIAGNOSIS — Z8679 Personal history of other diseases of the circulatory system: Secondary | ICD-10-CM

## 2011-02-04 DIAGNOSIS — I4892 Unspecified atrial flutter: Secondary | ICD-10-CM

## 2011-02-04 DIAGNOSIS — Z86718 Personal history of other venous thrombosis and embolism: Secondary | ICD-10-CM

## 2011-02-04 DIAGNOSIS — I82409 Acute embolism and thrombosis of unspecified deep veins of unspecified lower extremity: Secondary | ICD-10-CM

## 2011-02-04 LAB — POCT INR: INR: 2.7

## 2011-02-04 MED ORDER — DILTIAZEM HCL 240 MG PO CP24: 240.0000 mg | ORAL_CAPSULE | Freq: Every day | ORAL | Status: DC

## 2011-02-12 NOTE — Assessment & Plan Note (Signed)
[facility] HEALTHCARE                            CARDIOLOGY OFFICE NOTE   NAME:[name], [name] SHOAFF                  MRN:          [id number]  DATE:05/25/2008                            DOB:          [birth year]    [name] [name] is here for cardiology followup.  She was seen in our office  on April 20, 2008.  Her overall cardiac status was stable.  She had in  fact had a CT scan of the long to rule out pulmonary embolus in early  June 2009, and it showed no embolus.  In the office, she was stable.  It  was felt that we should proceed with a Myoview scan.  This was done on  April 22, 2008.  She had no ischemia.   It is of note that the CT scan done to rule out pulmonary embolus did  have other findings.  There was question of a mediastinal lymph node and  it was felt that followup was needed.  I will refer her for pulmonary  evaluation for this.  There was also a question of thyroid nodules.  A  thyroid ultrasound was arranged.  This study was arranged and it was  done on May 05, 2008.  It is my understanding that there was  discussion about this finding after the result was available.  At this  time, I have pulled out the report and I see that there is a description  of 4 solid nodules and that biopsy was to be considered.  The patient  tells me that Dr. Carlis Abbott did talk to her about the possibility of  stopping Coumadin.  In the meantime, it appears that the patient has not  felt it necessary to follow through on further evaluation.  With the  information I have, I have stressed to her that it is very important.  It is safe for her to hold her Coumadin and this will be arranged.   PAST MEDICAL HISTORY:   ALLERGIES:  No known drug allergies.   MEDICATIONS:  Isosorbide, aspirin, vitamins, metformin, metoprolol,  Coumadin, Prilosec, Diovan, and furosemide.   OTHER MEDICAL PROBLEMS:  See the complete list below.   REVIEW OF SYSTEMS:  Today, she is feeling good.   She has no significant  complaints.  Her review of systems is negative.   PHYSICAL EXAMINATION:  Weight is 252 pounds.  Blood pressure is 140/72  with a pulse of 75.  The patient is oriented to person, time, and place.  Affect is normal.  HEENT reveals no xanthelasma.  She has normal  extraocular motion.  There are no carotid bruits.  There is no jugular  venous distention.  Lungs are clear.  Respiratory effort is not labored.  Cardiac exam reveals an S1 with an S2.  There are no clicks or  significant murmurs.  The abdomen is soft.  She has no peripheral edema.   Problems include,  1. Some dyspnea on exertion.  This is stable.  She has no sign of      ischemia by Myoview.  2. Enlarged mediastinal and hilar lymph nodes on CT  scan.  I will be      referring her to our pulmonary team for evaluation of this issue.  3. Thyroid nodules.  We will push for her to have a needle biopsy of      her thyroid and follow up with Dr. Carlis Abbott.  4. History of large hiatal hernia.  5. History of mild pulmonary hypertension.  6. Paroxysmal atrial fibrillation.  She holds sinus rhythm.  She has      been on Coumadin for this.  7. History of a pulmonary embolus in May 2006 on chronic Coumadin.  It      is safe for her to hold her Coumadin for the biopsy of her thyroid.  8. Diabetes.  9. Hypertension.  10.Normal left ventricular function.   Cardiac status is stable.  She will have evaluation with Pulmonary to  follow up CT scan findings in her lungs and we will help arrange for her  to have a biopsy of her thyroid and follow up with Dr. Carlis Abbott.     Carlena Bjornstad, MD, [facility]  Electronically Signed    JDK/MedQ  DD: 05/25/2008  DT: 05/26/2008  Job #: [id number]   cc:   [name] [name], M.D.

## 2011-02-12 NOTE — Assessment & Plan Note (Signed)
[facility] HEALTHCARE                            CARDIOLOGY OFFICE NOTE   NAME:Vardaman, [name]                   MRN:          [id number]  DATE:02/06/2007                            DOB:          1931/09/07    Ms. Paprocki is seen for cardiology followup.  I believe she has gained a  small amount of weight related to fluid.  She is on low dose Lasix.  She  has been careful with her salt but I think she has been drinking extra  water and I talked with her about drinking all fluids in moderation,  including water.  She is not having any chest pain or shortness of  breath.  She has had some leg cramps.  In the past this has caused her  to be leery of using diuretics.  When we checked her BMET last on  July 01, 2006 her potassium was 4.5, otherwise she is doing well.   PAST MEDICAL HISTORY:  ALLERGIES:  NO KNOWN DRUG ALLERGIES.   MEDICATIONS:  Isosorbide, aspirin, B-12, B-6, metformin, Diovan,  Metoprolol, Coumadin, Prilosec, and furosemide 20.   OTHER MEDICAL PROBLEMS:  See the complete list on my note of August 18, 2006.   REVIEW OF SYSTEMS:  As mentioned she has been having some leg cramps,  and otherwise she is feeling well.  Review of systems otherwise is  negative.   PHYSICAL EXAMINATION:  Weight is 262 pounds, up 4 pounds from our prior  weight.  Blood pressure is 130/78 with a pulse of 68.  The patient is  oriented to person, time, and place and her affect is normal.  HEENT:  Reveals no xanthelasma.  She has normal extraocular motion.  She  has normal conjunctivae.  Carotids revealed the question of a soft right  carotid bruit.  There is no jugular venous distention.  LUNGS:  Clear.  Respiratory effort is not labored.  CARDIAC:  Reveals an S1 with an S2.  There are no clicks or significant  murmurs.  ABDOMEN:  Soft.  She has normal bowel sounds.  The patient does have 1+ peripheral edema with some slight soreness in  the left ankle area.   PROBLEMS:  Are listed on my note of August 18, 2006.   1. Good left ventricular function but mild diastolic dysfunction.  2. Volume status, I believe that her volume status has increased      slightly, I have asked her to be more careful about her fluid      intake and take an extra dose of Lasix for the next 2 days.  A BMET      will be checked today.  3. Right carotid bruit.  This is soft, but we need a Doppler.  This      will be done and I will see her back for followup.     Carlena Bjornstad, MD, [facility]  Electronically Signed    JDK/MedQ  DD: 02/06/2007  DT: 02/06/2007  Job #: [id number]   cc:   Jeanann Lewandowsky, M.D.

## 2011-02-12 NOTE — Assessment & Plan Note (Signed)
[facility] HEALTHCARE                            CARDIOLOGY OFFICE NOTE   NAME:[name] [name], [name] [name]                   MRN:          [id number]  DATE:04/20/2008                            DOB:          Sep 23, 1931    PRIMARY CARDIOLOGIST:  Carlena Bjornstad, MD, [facility]   PRIMARY CARE PHYSICIAN:  Jeanann Lewandowsky, MD   Ms. Idol is a 77 year old female patient of Dr. Ron Parker who has a  history of shortness of breath, mild pulmonary hypertension, paroxysmal  atrial fibrillation, and history of pulmonary embolus on chronic  Coumadin.  She recently went to the emergency room because of increased  shortness of breath.  Chest x-ray showed large hiatal hernia.  Heart was  mildly enlarged.  There is no heart failure.  CT of her chest showed no  evidence of acute pulmonary embolus.  She did have enlarged mediastinal  and hilar lymph nodes, which raise a question of lymphoma.  She had a  large hiatal hernia and a small right thyroid nodule for which they  recommendations ultrasound evaluation.  She also had cardiomegaly and  COPD on chest x-ray.  She was diagnosed with a UTI, given antibiotics,  and sent home.  The patient describes several-month history of increased  dyspnea on exertion.  She says when she walks from the handicapped  parking lot to our office, she becomes quite short of breath with a  little tightness.  No radiation of pain, dizziness, presyncope, or  palpitations, she says, mainly with her breathing that gives her  trouble.  She is very diligent about her salt and does not like to take  much diuretics because of its side effects.  She stays on 20 mg daily of  furosemide.  Cardiac risk factors are significant for hypertension,  diabetes, and a strong family history of coronary artery disease.  Her  mother died of an MI at age 67.   CURRENT MEDICATIONS:  1. Isosorbide 30 mg daily.  2. Aspirin 81 mg daily.  3. B12 500 daily.  4. B6 200 mg daily.  5. Metformin  500 mg 2 in the morning and 1 in the evening.  6. Metoprolol 50 mg 1-1/2 b.i.d.  7. Warfarin as directed.  8. Prilosec over the counter.  9. Diovan 160 mg daily.  10.Furosemide 20 mg daily.   PHYSICAL EXAMINATION:  GENERAL:  This is a very pleasant 77 year old  female in no acute distress.  VITAL SIGNS:  Blood pressure is 138/68, pulse 56, and weight 256.  NECK:  Without JVD, HJR, bruit, or thyroid enlargement.  LUNGS:  Decreased breath sounds but clear without wheezing, rales, or  rhonchi.  HEART:  Regular rate and rhythm at 56 beats per minute.  Normal S1 and  S2.  Positive S4.  No significant murmur heard.  ABDOMEN:  Soft without organomegaly, masses, lesions, or abnormal  tenderness.  EXTREMITIES:  Edema +1 bilaterally.  Positive distal pulses.   EKG from the ER, normal sinus rhythm, normal EKG.   IMPRESSION:  1. Dyspnea on exertion times several months, rule out ischemic heart      disease.  2. Enlarged mediastinal and hilar lymph nodes on CT raising a question      of lymphoma, needs followup.  3. Small right thyroid nodule, needs thyroid ultrasound.  4. Large hiatal hernia.  5. History of mild pulmonary hypertension.  6. History of paroxysmal atrial fibrillation, holding normal sinus      rhythm.  7. History of pulmonary embolus in May 2006 on chronic Coumadin      therapy.  8. Diabetes mellitus.  9. Hypertension.  10.History of normal left ventricular function with mild diastolic      dysfunction.  Last echo in 2007.  11.Negative adenosine Myoview.   PLAN:  At this time, we will check an adenosine Myoview to rule out  ischemic heart disease as a source of her dyspnea on exertion.  I have  increased her furosemide to 40 mg daily to see if this would help, and I  have asked her to follow up with Dr. Jeanann Lewandowsky concerning her  mediastinal lymph node enlargement as well as her thyroid nodule.  She  will see Dr. Ron Parker back after the adenosine Cardiolite.       Ermalinda Barrios, PA-C  Electronically Signed      Denice Bors. Stanford Breed, MD, [facility]  Electronically Signed   ML/MedQ  DD: 04/20/2008  DT: 04/21/2008  Job #: [id number]

## 2011-02-12 NOTE — Assessment & Plan Note (Signed)
[facility] HEALTHCARE                            CARDIOLOGY OFFICE NOTE   NAME:Knudtson, [name]                   MRN:          [id number]  DATE:05/12/2007                            DOB:          1930-11-03    Ms. Lefever returns.  Her volume is under good control.  She gets cramps  from Lasix, and she has learned how to cut back on her salt and fluid  intake, and therefore, she does not require Lasix all of the time, and  she will only need it on an intermittent basis.  I am very comfortable  with this.   PAST MEDICAL HISTORY:   ALLERGIES:  NO KNOWN DRUG ALLERGIES.   MEDICATIONS:  Isosorbide, aspirin, B12, B6, metformin, Diovan,  metoprolol, Coumadin, Prilosec OTC.   OTHER MEDICAL PROBLEMS:  See the list on my note of Feb 06, 2007.   REVIEW OF SYSTEMS:  She is doing well, and review of systems is  negative.   PHYSICAL EXAMINATION:  Weight is 259 pounds, which is decreased by 2  pounds since her last visit.  Blood pressure is 132/72 with a pulse of  68.  The patient is oriented to person, time, and place.  Affect is normal.  HEENT:  Reveals no xanthelasma.  She has normal extraocular motion.  There are no carotid bruits.  There is no jugular venous distention.  LUNGS:  Clear.  Respiratory effort is not labored.  CARDIAC:  Exam reveals an S1 with an S2.  There are no clicks or  significant murmurs.  ABDOMEN:  Soft.  She has normal bowel sounds.  She has trace to 1+ peripheral edema.   PROBLEMS:  1. Listed on my note of Feb 06, 2007.  2. Volume status.  While watching her salt and fluid intake, she is      doing well.  She wears support hose.  A small amount of peripheral      edema is no problem for her.  3. Right carotid bruit in the past.  4. The patient did have a Doppler done in May 2008.  She had 40% to      59% stenosis in the left internal carotid, and this will need a 1-      year followup.  We will see her back for followup, also myself,  in      1 year.    Carlena Bjornstad, MD, [facility]  Electronically Signed   JDK/MedQ  DD: 05/12/2007  DT: 05/13/2007  Job #: [id number]   cc:   Jeanann Lewandowsky, M.D.

## 2011-02-12 NOTE — Assessment & Plan Note (Signed)
[facility] HEALTHCARE                            CARDIOLOGY OFFICE NOTE   NAME:Zimmerle, SYRENA BRISKEY                  MRN:          [id number]  DATE:07/13/2008                            DOB:          1930-12-23    Ms. Pehrson is here for cardiology followup.  I saw her last on May 25, 2008.  She has had shortness of breath.  She had a history of  thyroid nodules and we helped to arrange for thyroid biopsy and she had  an abnormal CT scan and we arranged for pulmonary followup.  Since that  time no pulmonary consultation.   Since that time the patient has seen Dr. Gwenette Greet.  In pulmonary  consultation, he recommended a followup CT scan.  The patient responded  that she had decided she did not want to have that scan.  Of course, Dr.  Gwenette Greet and I are both in favor of her proceeding with this scan.  Her  shortness of breath is better.  She is stable at this point.  She did  have a biopsy of her thyroid.  It is my understanding that this study  showed no significant abnormalities.   There is a report from June 01, 2008, stating that the findings were  consistent with a non-neoplastic goiter.   PAST MEDICAL HISTORY:   ALLERGIES:  No known drug allergies.   MEDICATIONS:  1. Isosorbide 30.  2. Aspirin.  3. Vitamins.  4. Metformin.  5. Metoprolol.  6. Coumadin.  7. Prilosec.  8. Diovan.  9. Furosemide.   OTHER MEDICAL PROBLEMS:  See the list on my note of May 25, 2008.   REVIEW OF SYSTEMS:  She is not having any GI or GU symptoms.  She has no  fevers or chills.  She has no arthralgias.  Otherwise, her review of  systems is negative.   PHYSICAL EXAMINATION:  VITAL SIGNS:  Blood pressure today is 150/68 with  a pulse of 69.  GENERAL:  The patient is oriented to person, time, and place.  Affect is  normal.  HEENT:  No xanthelasma.  She has normal extraocular motion.  NECK:  There are no carotid bruits.  There is no jugular venous  distention.  LUNGS:  Clear.  Respiratory effort is not labored.  CARDIAC:  S1 and S2.  There are no clicks or significant murmurs.  ABDOMEN:  Soft.  She has no significant peripheral edema.   Problems are listed on the note of May 25, 2008.  #1.  Dyspnea, she is feeling better.  She has no ischemia by Myoview.  No further workup is to be done at this time.  #2.  Enlarged mediastinal and hilar lymph nodes on CT scan.  Per Dr.  Gwenette Greet, she needs a followup CT scan.  She has refused at this point.  The rest will be up to her.  #3.  History of thyroid nodule.  She has now had this biopsied and it is  a non-neoplastic goiter.  #6.  Paroxysmal atrial fibrillation.  She is stable.  She is on  Coumadin.  #7.  History of  pulmonary embolus and she is also on Coumadin for this.   Cardiac status is stable.  No change in her meds.  I will see her back  in 1 year.     Carlena Bjornstad, MD, [facility]  Electronically Signed    JDK/MedQ  DD: 07/13/2008  DT: 07/14/2008  Job #: [id number]   cc:   Jeanann Lewandowsky, M.D.  Kathee Delton, MD,FCCP

## 2011-02-15 NOTE — H&P (Signed)
NAME:  [name] [name], PARQUETTE NO.:  [id number]   MEDICAL RECORD NO.:  [id number]          PATIENT TYPE:  INP   LOCATION:  1829                         FACILITY:  [facility]   PHYSICIAN:  Ernestine Mcmurray, M.D. LHCDATE OF BIRTH:  10/09/1930   DATE OF ADMISSION:  02/11/2005  DATE OF DISCHARGE:                                HISTORY & PHYSICAL   PHYSICIANS:  Primary care physician: Dr. Carlis Abbott  Primary cardiologist:  Dola Argyle, M.D.  GI physician: Delfin Edis, M.D.   SUMMARY OF HISTORY:  Ms. Houx is a 77 year old African-American female  who presents to [facility] complaining of chest discomfort.  Toward the end of April, she was recently hospitalized after a fall,  fracturing her left lower ankle and underwent surgery. Since her  hospitalization, she has done well until this past Friday when she developed  constipation secondary to her pain medications. She began taking a stool  softener under the direction of the surgeon; however, she did take her  Friday evening Lopressor, and on Saturday morning when she woke up, she felt  very faint, weak, short of breath. She denies any chest discomfort or  palpitations. However, on presentation to the emergency room, her EKG showed  her to be in atrial fibrillation with rapid ventricular rate. Chart is not  available to me at this time on this patient; however, the patient's  daughter stated that she received IV Cardizem restoring her to normal sinus  rhythm. The patient states that she slept all Saturday, but she continued to  feel weak and slightly short of breath. On Sunday, she remained weak and  short of breath but certainly not as bad as Saturday. She stated around 5:00  p.m. while lying on her bed, she developed inferior chest pressure.  This  would come on and last about 10-15 minutes and then resolve for about 30  minutes. She also noted shortness of breath, increased burping, and she felt  that it was slightly  worse than moving around. She did not have any nausea,  vomiting or diaphoresis. She denies any prior occurrences and feels that it  did not resemble her typical GERD. She gives it a 4 to 5 on a scale of 0/10.  On awakening this morning, it was still there; thus her presentation to the  emergency room.   PAST MEDICAL HISTORY:  No known drug allergies.   CURRENT MEDICATIONS:  1.  Metoprolol 50 mg 1/2 a tablet b.i.d.  2.  Diovan 160 mg q.d.  3.  Multivitamin.  4.  Ferrex 150 q.d.  5.  Fortamet 1000 mg q.a.m., 500 mg q.p.m.  6.  Aspirin 325 daily.  7.  Prilosec 20 mg q.d.  8.  P.R.N. Aleve.  9.  Stool softener.   She stopped taking her pain medications on Friday.   Her history is notable for hypertension, dyslipidemia, unknown last check;  borderline diabetes with sugars running 122-100 at home; iron-deficiency  anemia. She was hospitalized in January with upper GI bleed,  gastroesophageal ulcer, hiatal hernia and underwent upper GI with esophageal  dilatation. Her Coumadin was  discontinued at that time. She has a history of  SVT since 2001.  Dr. Ron Parker notes in his office notes has been difficult at  times to discern between atrial fibrillation, atrial flutter or reentrant  tachycardia. Her Coumadin has been on hold since January 2006 secondary to  her upper GI bleed. Dr. Ron Parker had performed an echocardiogram on December 25, 2004, that showed mild LVH, normal ejection fraction, diastolic dysfunction.  Her surgeries are notable for back surgery, hysterectomy and left ankle  surgery.   SOCIAL HISTORY:  She resides in [place] by herself. Her daughter and son  are close by. She is retired from [employer], and currently she is using a  walker to assist with ambulation with her fractured foot. She quit smoking  in 2001. She denies any alcohol, drugs or herbal medications. She follows an  ADA diet. She is currently receiving PT for her leg.   FAMILY HISTORY:  Her mother is 61. Her mother  died at the age of 44 with a  myocardial infarction, her father at the age of 44 with pneumonia. She has a  brother and sister, one of which had CVA and another pacer.   REVIEW OF SYSTEMS:  Notable for glasses, dentures, nocturia, postmenopausal  in addition to the above.   PHYSICAL EXAMINATION:  GENERAL:  Well-nourished, well-developed, pleasant  African-American female in no apparent distress.  VITAL SIGNS:  Temperature 97.7, blood pressure 174/79, pulse 76 and regular,  respirations 22, 95% saturation on room air.  HEENT:  Unremarkable.  NECK:  Supple without thyromegaly, adenopathy, JVD, or carotid bruits.  CHEST:  Symmetrical excursion. Clear to auscultation.  HEART:  Regular rate and rhythm. Normal S1 and S2. No murmurs, rubs, clicks  or gallops. PMI is not displaced.  SKIN:  Integument was intact without lesions.  ABDOMEN:  Slightly obese, positive bowel sounds without abdominal bruits,  organomegaly, masses, or tenderness.  EXTREMITIES:  Negative cyanosis or clubbing. She does have some trace edema  in her left toes and a cast on the left lower extremity.  MUSCULOSKELETAL:  Unremarkable.  NEUROLOGIC:  Unremarkable.   Chest x-ray shows cardiomegaly, no signs of edema, large hiatal hernia.   EKG shows normal sinus rhythm with PACs, possible early repolarization,  normal axis, normal intervals, early R wave.   Hemoglobin and hematocrit are 1.9 and 36.8, respectively with normal  indices, platelets 521, WBC 4.0.  Sodium 139, potassium 4.0, BUN 10,  creatinine 0.8, glucose 105. Normal LFTs. PTT 26, PT 13.3. D-dimer is  elevated at 1.82. ER markers x 1 was negative.   IMPRESSION:  1.  Atypical chest discomfort.  2.  Elevated D-dimer with recent surgery on left ankle.  3.  Hypertension.  4.  Recurrent atrial fibrillation seen in the emergency room Saturday.  5.  No Coumadin secondary to upper gastrointestinal bleed in January 2006.  6.  DYE allergy.  DISPOSITION:  Dr.  Dannielle Burn reviewed the patient's history, spoke with and  examined the patient. He felt that her symptoms are concerning for unstable  angina, and she is not currently in atrial fibrillation. He feels that she  should undergo cardiac catheterization to rule out coronary artery disease.  Will premedicate her given her DYE allergy. We will do a VQ scan to rule out  pulmonary embolism given her DYE allergy.  Risks, benefits, and procedure  have been explained to her by Dr. Dannielle Burn. She agrees to proceed as planned.  He feels that she will ultimately  need Coumadinization at time of discharge,  blood pressure diary with outpatient follow-up for better blood pressure  control, and investigation as to why the patient is not on a statin with a  history of hyperlipidemia.      EW/MEDQ  D:  02/11/2005  T:  02/11/2005  Job:  [id number]

## 2011-02-15 NOTE — Discharge Summary (Signed)
NAMEJIMMIA, [name] [name] NO.:  [id number]   MEDICAL RECORD NO.:  [id number]          PATIENT TYPE:  INP   LOCATION:  [id number]                         FACILITY:  [facility]   PHYSICIAN:  Marijo Conception. Wall, M.D.   DATE OF BIRTH:  [birth year]   DATE OF ADMISSION:  10/16/2004  DATE OF DISCHARGE:  10/22/2004                                 DISCHARGE SUMMARY   BRIEF HISTORY AND PHYSICAL:  This is a 77 year old female who was admitted  to [facility] on October 16, 2004, by Dr. Verl Blalock.  Her primary care  physician is Dr. Jeanann Lewandowsky.  Her cardiologist is Dr. Dola Argyle.  The  patient has a history of paroxysmal atrial fibrillation from 2001.  At that  time she was admitted to the hospital and received IV Cardizem that  converted her to a normal sinus rhythm.  She was seen by Dr. Ron Parker in the  hospital but has not been seen in follow-up.  Approximately one year ago the  patient had general malaise and weakness similar to her presenting symptoms  in 2001.  She saw her primary care physician; however, we do not know the  outcome of that visit other than the patient's report that her heartbeat was  not irregular at that time.   On the evening prior to admission the patient had sudden onset of presyncope  and tachy palpitations that started around 8 p.m.  Her symptoms gradually  eased off and she was able to go to sleep; however, on the morning of  admission, she again felt very fatigued and developed chest tightness.  She  came to [facility] Emergency Room.  She was found to be in a  narrow complex tachycardia, possibly atrial flutter.  She received IV  Cardizem and converted to normal sinus rhythm.  Apparently there was not a  12 lead performed while she was in the rapid heart rate.  The patient's  symptoms resolved when she converted to normal sinus rhythm.  She was  admitted for further evaluation of her chest discomfort and further  monitoring of her heart  rhythm.   PAST MEDICAL HISTORY:  1.  Significant for paroxysmal atrial fibrillation as noted.  2.  History of dyslipidemia.  3.  Hypertension.  4.  Borderline diabetes.  5.  Positive family history of coronary artery disease.  6.  Remote history of tobacco use.  7.  She is status post back surgery and hysterectomy.   ALLERGIES:  No known drug allergies.   MEDICATIONS ON ADMISSION:  1.  Aspirin 325 mg daily.  2.  Diovan 160 mg daily.  3.  Diabetic medication which she had recently been started on.  She did not      know the name of this medication.   SOCIAL HISTORY:  The patient lives alone in [place].  She has a daughter  and sister who live nearby.  She is retired.  She has a 25 pack-year history  of tobacco use.  She quit in 2001.  She does not abuse alcohol or drugs.   FAMILY HISTORY:  The patient's  mother died at age 37 from MI.  Her father  died at age 73 from pneumonia.  She has a brother with history of a CVA and  a sister who required a permanent pacemaker for what sounds like tachy-brady  syndrome, but no heart disease.   HOSPITAL COURSE:  As noted this patient was admitted for further evaluation  of chest pain associated with tachy arrhythmia, probable atrial  fibrillation.  The patient was placed on Lovenox with plans for full  coumadinization.  She was noted to be anemic following her admission and it  was felt that a GI evaluation was warranted in view of the fact that she was  going to be anticoagulated.   The patient was seen in consultation by Dr. Delfin Edis on October 17, 2004.  She was found to have guaiac positive stool and microcytic anemia of  moderate severity.  An upper endoscopy was planned for October 18, 2004 with  a colonoscopy on October 19, 2004, as well as further iron studies, B12,  etc.  It was recommended that her Coumadin be held until after the GI workup  but then completed.  It was thought that she would probably need an  outpatient  Cardiolite at some point as well as an outpatient echo.  The  patient continued to have occasional episodes of atrial flutter.  It was  felt that she may be a candidate for an ablation.   On October 18, 2004 the patient underwent an upper endoscopy performed by  Dr. Delfin Edis.  She was found to have an ulcerated area at the GE  junction.  It was unable to be determined whether the patient had a tumor or  severe esophagitis.  Biopsies were performed and were pending.  A CT scan of  the distal esophagus was recommended, but it was felt that the patient would  not require a colonoscopy.  It was noted that the patient was going in and  out of TAT during the endoscopy.  The patient's hemoglobin dropped to about  eight grams and transfusion was recommended.  As noted her Coumadin was  placed on hold.   A chest CT was performed that showed a large mass in the distal esophagus  consistent with adenopathy.  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  The biopsies came back on January 21.  Per Dr.  Sydell Axon Brodie's report the biopsies showed reactive changes but no malignancy.  However, there was a strong suspicion for malignancy.  It was felt that the  biopsies may have been too superficial and it was recommended that a repeat  upper endoscopy be performed with deeper biopsies.  It was also recommended  that the patient have a barium swallow and possibly an MRI.   A repeat upper endoscopy was performed on October 20, 2004.  The patient was  noted to have large healing shallow ulcerations of the GE junction and  biopsies were again taken.  There was also a large hiatal hernia noted.  There was no evidence of an esophageal mass.  A barium swallow was  recommended to further evaluate her esophageal anatomy.   The barium swallow was performed on October 22, 2004.  The results are pending at time of this dictation.  The patient was discharged shortly after  the  procedure in stable and improved condition.  She was placed on  medications for heart rate control, although no anticoagulation until after  her ulcerations  had healed.  This was a recommendation for GI and cardiology  agreed with this plan.   LABORATORY DATA:  The most recent CBC on October 22, 2004, revealed  hemoglobin 9.4, hematocrit 28.7, WBC 6600, platelets 307,000.  A chemistry  profile on January 17 revealed BUN 15, creatinine 0.9, potassium 3.9,  glucose 240.  Hemoglobin A1c was 8.2.  Cardiac enzymes were negative x3.  A  lipid profile revealed cholesterol 189, triglycerides 263.  HDL was 32.  LDL  was 104, TSH was 1.008.  Iron studies revealed iron to be low at 18, total  iron binding capacity was normal at 418.  Percent saturation was low at 4.  B12 was 533.  A CEA was 2.1 which is within normal limits,  Please see  results of other studies as noted above.  An EKG on January 17 showed normal  sinus rhythm.  Rate 74 beats per minute was interpreted as a normal EKG.  The CT scan of the chest and a CT scan of the neck were performed.  The  chest showed a large distal esophageal mass likely to represent a neoplasm  or possible hiatal hernia.  There was lymphadenopathy in the subcarinal  region just above the mss.  There was no obvious metastatic disease in that  portion of the upper abdomen.  The CT scan of the neck with contrast  revealed no pathological findings.   DISCHARGE MEDICATIONS:  1.  Protonix 40 mg b.i.d.  2.  Lopressor 50 mg. b.i.d.  3.  New Iron 150 mg twice daily.  4.  Her previously prescribed diabetic medications.  5.  __________for pain.   ACTIVITY:  As tolerated.   DIET:  The patient was told to stay on a low salt, low fat, diabetic diet.   FOLLOW UP:  She was to follow up with Dr. Delfin Edis, 8:30 a.m., February  6.  She was to follow up with Dr. Ron Parker February 9 at 10 a.m.   PROBLEM LIST AT TIME OF DISCHARGE:  1.  Paroxysmal atrial flutter with previous  history of atrial fibrillation      with symptoms of chest pain.  2.  Anemia with guaiac positive stools.  3.  Gastrointestinal evaluation with upper endoscopies performed x2.  Final      biopsies pending but was a question of esophageal cancer.  4.  Anemia requiring transfusion.  5.  Hyperlipidemia.  6.  Hypertension.  7.  Recently diagnosed diabetes mellitus.  8.  Chest pain, myocardial infarction ruled out.  9.  No anticoagulation at this time due to esophageal bleeding.  10. Remote tobacco history.  41. Family history of coronary artery disease.  12. Status post back surgery and hysterectomy.   PLAN:  The patient will follow up with Dr. Ron Parker as noted.  It was Dr. Harrington Challenger'  recommendation that the patient have an adenosine Cardiolite performed as  well a 2-D echo in the office to further evaluate for coronary artery  disease.  It was also recommended that she have an EP evaluation secondary to her symptomatic atrial flutter, which occurs in bursts with the  possibility of ablation to be addressed.  She also has follow-up with Dr.  Delfin Edis and will need further monitoring of her anemia and her  esophageal mass.  As noted no anticoagulation has been recommended at this  time.      DR/MEDQ  D:  10/23/2004  T:  10/23/2004  Job:  [id number]   cc:  Jeanann Lewandowsky, M.D.  [address] [place] [address]  Fax: [phone]   Delfin Edis, M.D. [facility]

## 2011-02-15 NOTE — Assessment & Plan Note (Signed)
[facility]                              CARDIOLOGY OFFICE NOTE   NAME:Eisemann, [name]                   MRN:          [id number]  DATE:05/22/2006                            DOB:          Sep 23, 1931    Ms. Baldonado is seen for cardiac followup.  She continues to have some  palpitations, although they are improved on a higher dose of Lopressor.  She  also has shortness of breath.  She has exertional shortness of breath. She  may have rare PND.  She does have some edema.  She feels it is not  necessarily worse than what she has had.  I think that a small dose of a  diuretic might help, but she has had leg cramps in the past, and she feels  that it has always been related to the diuretics.  Therefore, we will not  restart them as of today.   PAST MEDICAL HISTORY:  See the complete list below.   ALLERGIES:  No known drug allergies.   MEDICATIONS:  1. Metoprolol 75 b.i.d.  2. Diovan 160.  3. Multivitamin.  4. Coumadin as directed.  5. Aspirin 81.  6. Metformin.  7. Isosorbide.  8. Vitamins.  9. Prilosec.   REVIEW OF SYSTEMS:  Patient mentions her palpitations, and she mentions her  shortness of breath, otherwise her review of systems is negative.   PHYSICAL EXAMINATION:  VITAL SIGNS:  Blood pressure 124/77 with a pulse of  55.  GENERAL:  Patient is oriented to person, time, and place.  Affect is normal.  She is here with a family member today.  HEENT:  No xanthelasma.  The patient has normal extraocular motion.  NECK:  There are no carotid bruits.  There is no jugular venous distention.  LUNGS:  Clear.  Respiratory effort is not labored.  CARDIAC:  An S1 with an S2.  There are no clicks or significant murmurs.  ABDOMEN:  Obese.  EXTREMITIES:  The patient is wearing support hose, and despite her hose, she  does have some peripheral edema.   As mentioned in the complete note by Estella Husk on April 29, 2006,  patient was seen in the  emergency room.  Her chest x-ray that showed hiatal  hernia and COPD.  There was a large hiatal hernia.  There was biapical  pleural thickening.  She has COPD.   PROBLEMS:  1. Ongoing shortness of breath:  At this point, I am not convinced that      this is cardiac.  We will do a 2D echo to reassess LV function and try      to obtain pulmonary artery pressures to be sure that her LV and RV      function are good.  I will consider a diuretic, just because she does      have some peripheral edema; however, pulmonary evaluation and/or a      cardiopulmonary exercise test may be the appropriate next step.  2. History of paroxysmal atrial fibrillation:  She is holding sinus rhythm      but has palpitations.  3.  Coumadin therapy.  4. History of pulmonary embolus in May, 2006.  Coumadin therapy.  5. Unna boot to her leg in the past that has stabilized.  6. Diabetes.  7. Hypertension.  8. Good left ventricular function with grade 1 diastolic dysfunction.  9. History of a gastrointestinal ulcer with a bleed that eventually      stabilized.  The patient had no ischemia on a nuclear scan in 2007.   I do not have all the answers about her shortness of breath at this time.  When I tried to consider diuretics, she was very concerned that she will  have cramps.  We will do a 2D echo, and I will see her back in followup and  make further decisions as it relates to the discussion above.                               Carlena Bjornstad, MD, [facility]    JDK/MedQ  DD:  05/22/2006  DT:  05/22/2006  Job #:  [id number]   cc:   Jeanann Lewandowsky, MD

## 2011-02-15 NOTE — Assessment & Plan Note (Signed)
[facility]                              CARDIOLOGY OFFICE NOTE   NAME:[name], [name] Roys                   MRN:          [id number]  DATE:08/18/2006                            DOB:          10-30-1930    [name] [name] is seen back to follow up on her fluid status. She is doing  well. The addition of low-dose Lasix has been very effective for her.   She is taking low-dose Lasix. Her renal status is stable. She understands to  be careful with her salt and fluid intake using moderation in both cases.  She is going about full activities. She is not having any problems.   ALLERGIES:  No known drug allergies.   MEDICATIONS:  1. Isosorbide.  2. Aspirin.  3. Vitamins.  4. Metformin.  5. Diovan.  6. Metoprolol.  7. Coumadin.  8. Prilosec.  9. Furosemide 20 daily.   OTHER MEDICAL PROBLEMS:  See the complete list on the note of May 22, 2006.   REVIEW OF SYSTEMS:  She feels fine, and the review of systems today is  negative.   PHYSICAL EXAMINATION:  Weight is 258, blood pressure 110/62 with a pulse of  57.  The patient is oriented to person, time and place, and her affect is normal.  HEENT:  Reveals no xanthelasma. She has normal extraocular motion. There is  no jugular venous distention. There are no carotid bruits.  CARDIAC EXAM:  Reveals a S1 with an S2. There are no clicks or significant  murmurs.  LUNGS:  Clear. Respiratory effort is not labored.  She has no significant peripheral edema.   Problems are listed completely on the note of 05/22/2006.   1. Shortness of breath. This is improved.  2. History of paroxysmal atrial fibrillation holding sinus on Coumadin.  3. Coumadin therapy.  4. History of pulmonary embolism May of 2006, Coumadin therapy.  5. Unna boot for her left leg that has stabilized.  6. Diabetes.  7. Hypertension.  8. Good left ventricular function that was rechecked recently. There was      mild diastolic  dysfunction.  9. Mild pulmonary hypertension with a PA pressure in the range of 44 mmHg.  10.History of a gastrointestinal ulcer and bleed that stabilized.  11.No evidence of ischemia as she had a nuclear scan 2007 that was okay.  12.Volume status. Her volume status is controlled on low-dose Lasix. She      is stable. I will see her back in 6 months.     Carlena Bjornstad, MD, [facility]  Electronically Signed    JDK/MedQ  DD: 08/18/2006  DT: 08/18/2006  Job #: [id number]   cc:   Jeanann Lewandowsky, M.D.

## 2011-02-15 NOTE — Assessment & Plan Note (Signed)
[place]                                   ON-CALL NOTE   NAME:Fullbright, [name]                   MRN:          [id number]  DATE:06/09/2006                            DOB:          August 26, 1931    TELEPHONE CONTACT NOTE:   DATE OF SERVICE:  06/09/2006.   PRIMARY CARDIOLOGIST:  Carlena Bjornstad, MD, [facility]   [name] is a 77 year old female with a history of paroxysmal atrial  fibrillation and chronic Coumadin therapy as she also had a PE in May of  2005.  She called this evening because of hematuria.   [name] states that she has been compliant with her medications and  taking her Coumadin as directed.  She was fine earlier today, but when she  went to use the bathroom this evening, she felt the urine was grossly bloody  and said it looked like she was urinating pure blood.  She did not describe  any pain with urination and denies any recent fevers or chills.  She did not  get her Coumadin checked today.   I discussed the situation with [name] and requested that she come to  the emergency room to be evaluated for hematoma and also to get a Coumadin  checked.  She stated that she would do this.  I advised her that if she had  any dizziness or any weakness she needed to call 911, but she stated she did  not, and she would get her daughter to drive her to the hospital.                                   Rosaria Ferries, PA-C                                Denice Bors. Stanford Breed, MD, [facility]   RB/MedQ  DD:  06/09/2006  DT:  06/10/2006  Job #:  [id number]

## 2011-02-15 NOTE — Consult Note (Signed)
[name], [name] NO.:  [id number]   MEDICAL RECORD NO.:  [id number]          PATIENT TYPE:  INP   LOCATION:  [id number]                         FACILITY:  [facility]   PHYSICIAN:  Glori Bickers, M.D. LHCDATE OF BIRTH:  24-May-1931   DATE OF CONSULTATION:  DATE OF DISCHARGE:                                   CONSULTATION   PRIMARY CARE PHYSICIAN:  Jeanann Lewandowsky, M.D.   PRIMARY CARDIOLOGIST:  Dola Argyle, M.D.   PATIENT ID:  [name] is a delightful 77 year old woman with a history  of paroxysmal atrial fibrillation, diabetes, hypertension, and  hyperlipidemia, who we were asked to consult on regarding perioperative  atrial fibrillation after repair of left ankle fracture.   [name] was admitted in January, 2006 with tachy palpitations,  presyncope, and chest tightness.  She was found to be in SVT, which was  thought to be atrial fibrillation.  She was given Cardizem and converted  back to sinus rhythm; however, she did have several episodes of recurrent  SVT while in the hospital.  During that admission, she was also found to  have microcytic anemia.  EGD revealed ulcerations at the gastroesophageal  junction that were highly suspicious for malignancy, but the biopsy was  negative.  As a result, Coumadin was not started, and the plan was for an  outpatient Cardiolite.   Since discharge, she has been followed up by Dr. Delfin Edis, and has had a  repeat EGD, which by her report again, was negative, and it was thought just  due to reflux disease.  She saw Dr. Ron Parker last month, and the decision was to  refrain from long-term anticoagulation, given her risk of bleeding.  From a  cardiovascular point of view, she has done quite well.  She has not had any  chest pain or dyspnea on exertion.  She denies any recurrent palpitations.  She was admitted on April 22nd with a left ankle fracture, status post  falling outside.  There was no evidence of arrhythmia.  She  just simply  slipped.  She underwent operative repair today with fixation by Dr. Adriana Mccallum.  Intraoperatively, she developed some atrial fibrillation with rapid  ventricular response.  She was started on Cardizem and converted to a sinus  rhythm by the time she reached the regular rhythm.  She is now asymptomatic.   On review of systems, this is negative for any recent nausea, vomiting,  fevers, chills, headache, dysuria.  She does have some reflux.  The  remainder of the review of systems is negative.   PAST MEDICAL HISTORY:  1.  Paroxysmal atrial fibrillation.  Not anticoagulated due to microcytic      anemia with peptic ulcer disease.  2.  Hypertension.  3.  Hyperlipidemia.  4.  Microcytic anemia with PUD, with workup by Dr. Delfin Edis.  5.  Status post hysterectomy.  6.  Status post back surgery.  7.  Gastroesophageal reflux disease.   CURRENT MEDICATIONS:  1.  Aspirin 325 mg a day.  2.  Diovan 160 daily.  3.  Fortamet, multivitamin, iron, and metoprolol, which  she does not think      she is taking.   She is allergic to IV CONTRAST DYE.   SOCIAL HISTORY:  She lives alone in [place].  She is retired.  She has a  history of tobacco use, 25 pack years, but quit in 2001.  There is no  alcohol use.   FAMILY HISTORY:  Significant for a mother who died at 26 due to an MI and a  father who died at 86 from pneumonia.  She has a brother with a history of a  CVA and a sister with a pacemaker secondary to what sounds like tachy/brady  syndrome.   PHYSICAL EXAMINATION:  VITAL SIGNS:  She is afebrile with a blood pressure  of 162/65 with a heart rate of 75.  She is satting in the high 90s.  GENERAL:  She is lying flat in bed in no acute distress.  She is in a good  mood.  HEENT:  Sclerae are anicteric.  EOMI.  There is no xanthelasmas.  Mucous  membranes are moist.  NECK:  Thick.  Unable to assess JVP.  Carotids are 2+ bilaterally without  any bruits.  There is no  lymphadenopathy or thyromegaly.  LUNGS:  Clear to auscultation.  HEART:  She has a regular rate and rhythm with a 2/6 systolic ejection  murmur at the left sternal border.  There is no rub or gallop.  ABDOMEN:  Obese, nontender, nondistended with good bowel sounds.  There is  no evidence of hepatosplenomegaly.  There are no bruits.  There are no  palpable masses.  EXTREMITIES:  Warm with no clubbing, cyanosis or edema.  Distal pulses are  strong.  She has a cast on the left lower extremity.  NEUROLOGIC:  She has appropriate affect.  She is alert and oriented x 3,  otherwise nonfocal.   Labs show a white count of 4.8, hemoglobin 37%, platelets 296.  Sodium 134,  potassium 3.9, chloride 103, bicarb 23, BUN 13, creatinine 0.9, glucose 322.  INR 0.9, PTT 23.   EKG showed a normal sinus rhythm with a rate of 75.  There are no ST/T wave  changes.   ASSESSMENT/PLAN:  Perioperative atrial fibrillation, which is transient and  now resolved.  Can stop the Cardizem drip and just start Lopressor 25 b.i.d.  Agree with the plan to monitor on telemetry for at least 24 hours and check  TSH.  I will discuss with Dr. Ron Parker regarding the possibility of long-term  anticoagulation with Coumadin.  We appreciate the consult and will follow  with you.      DB/MEDQ  D:  01/20/2005  T:  01/20/2005  Job:  [id number]   cc:   Jeanann Lewandowsky, M.D.  [address] [place]  [place] [address]  Fax: [phone]   Dola Argyle, M.D.

## 2011-02-15 NOTE — Discharge Summary (Signed)
[name] [name], [name] [name] NO.:  [id number]   MEDICAL RECORD NO.:  [id number]          PATIENT TYPE:  INP   LOCATION:  2006                         FACILITY:  [facility]   PHYSICIAN:  Eustace Quail, M.D. [facility] DATE OF BIRTH:  1931-07-16   DATE OF ADMISSION:  02/11/2005  DATE OF DISCHARGE:  02/15/2005                           DISCHARGE SUMMARY - REFERRING   SUMMARY OF HISTORY:  [name] [name] is a 77 year old African-American female  who presented to [facility] Emergency Room complaining of chest discomfort.  This year has been very complicated for her as she was hospitalized in  January 2006 with a GI bleed, gastroesophageal ulcer and hiatal hernia.  GI  evaluation was performed and her Coumadin was discontinued at that time and  with her history of SVT, probable atrial fibrillation. Most recently, she  fell and sustained an ankle fracture and has been in a cast for the last two  weeks. The preceding Saturday and Sunday to her admission she developed some  shortness of breath associated with feeling faint.  She presented to the  emergency room and was noted to be in atrial fibrillation. She was restored  to normal sinus rhythm with IV Cardizem, however, she presented to the  emergency room on Monday again complaining of shortness of breath and chest  discomfort. It is felt that she should be admitted for further evaluation.  Her EKG at that time showed normal sinus rhythm.   LABORATORY DATA:  Chest x-ray on Feb 09, 2005, showed large hiatal hernia,  cardiomegaly, no evidence of CHF.  VQ scan showed segmental right lower lobe  ventilation perfusion mismatch, high probability for pulmonary embolus,  probable air trapping in the left lung base.   Admission H&H was 13.9 and 41.0, normal indices, platelets 521, WBCs 4.0.  Subsequent H&H on Feb 12, 2005, was 11.6 and 34.8, normal indices, platelets  46 the bases 43.  On Feb 13, 2005, H&H of 10.5 at 31.3.  On Feb 14, 2005, at  the  time of discharge, CBC 11.1 at 33.8, platelets 439.  Admission PT was  13.3 with INR 1.0, PTT of 26.  D-dimer was elevated at 1.82.  On Feb 13, 2005, INR was 1.2; Feb 14, 2005, 1.3; Feb 15, 2005, 1.9.  Admission sodium  was 140, potassium 4.0, BUN 11, creatinine 0.7, normal LFTs, glucose 102.  There were not any subsequent chemistries performed. CK, MBs and troponins  were negative x2.   HOSPITAL COURSE:  [name] [name] was admitted to the unit 2000 and initially  scheduled for cardiac catheterization. She was placed on IV heparin.  However, D-dimer came back as elevated it is felt that given her recent  surgery and decreased activity, she should undergo further evaluation for  pulmonary embolism. In the anticipation of cardiac catheterization, to avoid  dye load we performed a VQ scan that showed a high probability for pulmonary  embolism.  Her cardiac catheterization was cancelled. She was started on  Coumadin on Feb 12, 2005, and received Coumadin teaching.  She received 7.5  mg on Feb 12, 2005, 6 mg on March 16, 2005, and Feb 14, 2005, with an INR 1.9  at the time of discharge on Feb 15, 2005.  She ruled out for myocardial  infarction and her chest discomfort and shortness of breath had resolved.  She underwent Coumadin teaching with arrangements with case management,  nursing and pharmacy to be discharged home on Lovenox until INR therapeutic.   DISCHARGE DIAGNOSIS:  1.  Pulmonary embolism.  2.  Atrial fibrillation episode on Feb 09, 2005 as well as Feb 12, 2005,      resulting in increase of her beta blocker. 3.  History as previously      described.   DISPOSITION:  She is discharged home. Her metoprolol was increased to 50 mg  b.i.d. She was given new prescriptions for Coumadin 5 mg daily, Lovenox 110  mg q.12h.  Her aspirin was decreased to 81 mg daily.  She was asked to  continue her Diovan 160 daily, __________ 150 mg daily,  Prilosec 20 daily,  multivitamin daily, and Fortamet  1000 mg q.a.m. and 500 mg q.p.m.  Maintain  low salt, fat, cholesterol ADA diet.  She will have a PT/INR check on Monday  Feb 18, 2005, at 10:15 a.m.  She will follow up with Dr. Ron Parker on March 08, 2005 at 12:15 p.m.  She was asked to bring all medications to all  appointments. At the time of follow-up with Dr. Ron Parker, consideration should  be given to follow up H&H given her recent GI bleed.      EW/MEDQ  D:  02/15/2005  T:  02/15/2005  Job:  [id number]   cc:   Dr. Kathe Becton, M.D.   Delfin Edis, M.D. [facility]

## 2011-02-15 NOTE — H&P (Signed)
NAMELILLIEANNA, HAVLIK NO.:  [id number]   MEDICAL RECORD NO.:  [id number]          PATIENT TYPE:  EMS   LOCATION:  ED                           FACILITY:  [facility]   PHYSICIAN:  Marijo Conception. Wall, M.D.   DATE OF BIRTH:  12/29/1930   DATE OF ADMISSION:  10/16/2004  DATE OF DISCHARGE:                                HISTORY & PHYSICAL   PRIMARY CARE PHYSICIAN:  Jeanann Lewandowsky, M.D.   PRIMARY CARDIOLOGIST:  In 2001 - Dola Argyle, M.D.   CHIEF COMPLAINT:  Palpitations/chest pain.   HISTORY OF PRESENT ILLNESS:  [name] is a 77 year old female with a  history of paroxysmal atrial fibrillation in 2001.  She came to the hospital  at that time and received IV Cardizem which converted her to sinus rhythm.  She was seen by Dr. Ron Parker in the hospital but has not been seen by him since.  Approximately a year ago, [name] had general malaise and weakness  similar to her presenting symptoms in 2001 and saw her primary care  physician but was not told that her heart beat was irregular.  She has  otherwise done well until last p.m. when she had relatively sudden onset of  presyncope and tachy palpitations.  These started at about 8 p.m.  Her  symptoms eased off somewhat last night and she was able to sleep but this  morning, she felt general malaise and developed chest tightness as well.   At times she was aware of a rapid heart beat but otherwise not aware that  her heart beat was irregular.  She came to the emergency room and was found  to be in a narrow complex tachycardia, possibly atrial flutter.  She  received IV Cardizem and converted to sinus rhythm.  There is no 12-lead  available of the rapid heart rate.  Once she was in a regular rhythm, her  symptoms resolved.   [name] states that the chest tightness was a 4/10 and was associated  with shortness of breath.  She stated she never had it before this  admission.  She is generally active without symptoms but  does not exercise  regularly.   PAST MEDICAL HISTORY:  1.  Dyslipidemia.  2.  Hypertension.  3.  Borderline diabetes.  4.  Family history of coronary artery disease.  5.  Remote history of tobacco abuse.   PAST SURGICAL HISTORY:  1.  Back surgery.  2.  Hysterectomy.   ALLERGIES:  No known drug allergies.   MEDICATIONS:  1.  Aspirin 325 mg q.d.  2.  Diovan 160 q.d.  3.  Recently started diabetic medication, for which she was given samples      but does not know the name.   SOCIAL HISTORY:  She lives alone in [place] with her daughter and sister  nearby.  She is retired from __________.  She has an approximately 25 pack  year history of tobacco and quit in 2001.  She does not abuse alcohol or  drugs.   FAMILY HISTORY:  Her mother died at age 75 of an MI.  Her father died at age  47 of pneumonia.  She has a brother with a history of CVA and a sister who  required a pacemaker for what sounds like tachybrady syndrome but no heart  disease.   REVIEW OF SYSTEMS:  Palpitations, chest pain, shortness of breath, and  dyspnea on exertion as described above.  She denies any recent fevers or  chills.  She has heaviness in bilateral lower extremities and possibly  some numbness.  She denies arthralgias.  She states that she used to have  reflux symptoms but quit eating spicy food and no longer has them.  She  stated she had diarrhea several days ago but these symptoms resolved.   REVIEW OF SYSTEMS:  Otherwise negative.   PHYSICAL EXAMINATION:  VITAL SIGNS:  Temperature 98, blood pressure on  admission 162/126 and now 148/64, heart rate 72, respiratory rate 20, O2  saturation 96% on room air.  GENERAL:  She is a well-developed, elderly African-American female in no  acute distress.  HEENT:  Head is normocephalic, atraumatic.  Pupils equal, round, and  reactive to light and accommodation.  Extraocular movements are intact.  Sclerae clear.  NECK:  No lymphadenopathy, thyromegaly,  bruit, or JVD.  CARDIOVASCULAR:  Heart is regular rate and rhythm with an S1, S2, and a 2/6  systolic ejection murmur at the left sternal border.  Distal pulses are 2+  in all four extremities and no femoral bruits are appreciated.  LUNGS:  Clear to auscultation bilaterally.  ABDOMEN:  Soft, nontender, with active bowel sounds.  EXTREMITIES:  No clubbing, cyanosis, or edema noted.  MUSCULOSKELETAL:  No joint deformity or effusions.  No spine or CVA  tenderness.  NEUROLOGIC:  She is alert and oriented.  Cranial nerves II-XII are grossly  intact.   DIAGNOSTIC STUDIES:  Rhythm strips reveal a narrow complex tachycardia, rate  148 that is regular.  EKG is rate of 74 beats per minute, sinus rhythm  without any acute ischemic changes.   LABORATORY DATA:  __________ markers are negative x2.  Other labs are  pending.   ASSESSMENT AND PLAN:  1.  Tachy palpitations:  Will add p.o. Cardizem to [name]'s medication      regimen at 60 mg q.8h.  If she tolerates this well, will change to      Cardizem CD in a.m.  If she has any further episodes of palpitations,      will obtain a 12-lead.  2.  Dyslipidemia:  Will check in a.m.  3.  Hypertension:  Blood pressures should tolerate the Cardizem along with      her home medication.  4.  Borderline diabetes:  The family is going to obtain the name of her      medication and will add it to her medication regimen.  5.  Anticoagulation:  The patient is not aware of an irregular heart rate      except when it is very rapid.  Otherwise she complained of some general      malaise but could not tell if her heart was regular or not.  Will      therefore start      anticoagulation with Lovenox and transition to Coumadin.  6.  Chest pain:  Rule out MI with cardiac enzymes and if these are negative,      outpatient Cardiolite and echo for further evaluation given her multiple      cardiac risk factors.     RB/MEDQ  D:  10/16/2004  T:  10/16/2004  Job:   [id number]

## 2011-02-15 NOTE — Assessment & Plan Note (Signed)
[place]                              CARDIOLOGY OFFICE NOTE   NAME:Zahner, [name]                   MRN:          [id number]  DATE:04/29/2006                            DOB:          1931-03-29    This is a 77 year old African-American female patient of Dr. Dellis Filbert with a  history of paroxysmal atrial fibrillation and diastolic dysfunction on  Coumadin therapy.  She recently went to the emergency room with diarrhea and  increased dyspnea on exertion.  She was seen by Dr. Jeanell Sparrow and told to follow  up with cardiology.  She has also been seen by her primary.  The patient  says that she has had increased dyspnea on exertion and heart racing  recently.  When she got up this morning, she said her heart was racing.  Initial EKG in the office was normal sinus rhythm, but when she was sitting  up and I was examining her, her heart rate was much faster and irregular.  EKG was repeated, and ventricular rate when she laid down came back down to  68, but she was having frequent PACs.  Patient denies any dizziness or  presyncope with this, or excessive caffeine, she does not drink anything  with caffeine.   CURRENT MEDICATIONS:  1.  Metoprolol 50 mg b.i.d.  2.  Diovan 160 daily.  3.  Multivitamin daily.  4.  Coumadin as directed.  5.  Aspirin 81 mg daily.  6.  Metformin 500 2 in the morning, 1 in the evening.  7.  Isosorbide 30 mg daily.  8.  Vitamin B12 500 daily.  9.  Vitamin B6 200 daily.   PHYSICAL EXAMINATION:  VITAL SIGNS:  Blood pressure 118/76, pulse 70, weight  256.  GENERAL:  This is a very pleasant 77 year old African-American female in no  acute distress.  NECK:  Without JVD, HJR, bruit, or thyroid enlargement.  LUNGS:  Clear anteriorly and posterolaterally.  HEART:  Irregular rate and rhythm at 100 beats per minute.  Normal S1 and S2  with 1/6 systolic murmur at the left sternal border.  ABDOMEN:  Soft without organomegaly, masses,  lesions, or abnormal  tenderness.  EXTREMITIES:  Without clubbing, cyanosis or edema.  She has good distal  pulses.   INITIAL EKG:  Normal sinus rhythm.  Normal EKG.  Follow-up EKG showed  frequent PACs.   Recent chest x-ray on April 16, 2006, cardiomegaly, hiatal hernia, and COPD.   IMPRESSION:  1.  Palpitations with dyspnea on exertion, I suspect secondary to paroxysmal      atrial fibrillation.  2.  Paroxysmal atrial fibrillation with good left ventricular function,      ejection fraction of 55-60% and grade I diastolic dysfunction.  3.  Coumadin therapy.  4.  History of pulmonary embolus in May, 2006.  5.  History of gastrointestinal bleed in January, 2006, secondary to ulcer      and hiatal hernia.   PLAN:  I have increased her metoprolol to 75 mg in the morning and 50 in the  evening.  If she continues to have palpitations, I have  told her to bump it  up to 75 b.i.d.  She is scheduled to see Dr. Ron Parker back in three weeks and to  call if she is not having symptoms, we may want to consider a Holter  monitor.                                  Ermalinda Barrios, PA-C    ML/MedQ  DD:  04/29/2006  DT:  04/29/2006  Job #:  [id number]

## 2011-02-15 NOTE — Assessment & Plan Note (Signed)
[facility]                              CARDIOLOGY OFFICE NOTE   NAME:Diaz, [name]                   MRN:          [id number]  DATE:07/07/2006                            DOB:          14-Apr-1931    Ms. Kangas is seen for follow-up. I had seen her on June 23, 2006. I  felt that she was volume overloaded. She had been very hesitant to use a  diuretic because of leg cramps in the past. I finally convinced her to use  20 of Lasix. She says that her edema is better and her shortness of breath  is improving and she is quite pleased. She has not had any recurrent leg  cramps. Her BUN and creatinine on the medication is BUN 19 and creatinine  1.0, with a potassium of 4.5. Once again, I discussed with her that she  should limit her salt intake. Also, we need to limit her total fluid intake.  She does drink a lot of extra water and she needs to be moderate in her  fluid intake.   PAST MEDICAL HISTORY:   ALLERGIES:  No known drug allergies.   MEDICATIONS:  Isosorbide, aspirin, metformin, Diovan, metoprolol, warfarin,  Prilosec and furosemide 20 mg daily.   OTHER MEDICAL PROBLEMS:  See the complete list on my note of May 22, 2006.   REVIEW OF SYSTEMS:  The patient has had some mild fatigue. Otherwise, she is  feeling better and her review of systems is negative.   PHYSICAL EXAMINATION:  The patient is overweight. She is stable in the room.  She is oriented to person, time and place and her affect is normal.  LUNGS:  Clear. Respiratory effort is not labored.  CARDIAC: Reveals an S1, S2. There are no clicks or significant murmurs. The  patient has some peripheral edema, but it is improving. However, on our  scales, her weight has not changed significantly.   Problems are listed completely on my note of May 22, 2006.   Ongoing shortness of breath. This is somewhat improved with diuresis.   She will remain on Lasix 20 and I will see  her back in six weeks.            ______________________________  Carlena Bjornstad, MD, [facility]     JDK/MedQ  DD:  07/07/2006  DT:  07/07/2006  Job #:  [id number]   cc:   Jeanann Lewandowsky, M.D.

## 2011-02-15 NOTE — Assessment & Plan Note (Signed)
[facility]                              CARDIOLOGY OFFICE NOTE   NAME:[name] [name], [name] [name]                   MRN:          [id number]  DATE:06/23/2006                            DOB:          07/12/1931    [name] [name] is seen for cardiology followup.  I had seen her on May 22, 2006.  She continues to have exertional shortness of breath.  She is  relatively stable with this.  Since seeing her last, she had hematuria and  was seen at [facility] emergency room.  It was felt that she had a urinary  tract infection and she was treated with an antibiotic.  This has improved.  However, she still has exertional shortness of breath.  She is not having  chest pain.   I had arranged for a 2D echo.  This study shows an ejection fraction of 55-  60%.  Pulmonary pressures are moderately dilated with a right systolic  pressure estimate of 44 mmHg.  There was mild mitral regurgitation.  There  was moderate tricuspid regurgitation.   The patient continues to have some problems with volume overload.  She  continues to be hesitant about using a diuretic because of leg cramps in the  past.   PAST MEDICAL HISTORY:   ALLERGIES:  No known drug allergies.   MEDICATIONS:  1. Isosorbide 30.  2. Aspirin 81.  3. B12.  4. B6.  5. Metformin.  6. Diovan.  7. Metoprolol.  8. Coumadin.  9. Prilosec OTC.   OTHER MEDICAL PROBLEMS:  See the complete list on my note of May 22, 2006  that is completely reviewed.   REVIEW OF SYSTEMS:  Her shortness of breath continues to be her major issue.  She does mention that, today, while putting on support hose, she thought she  might have some leg cramps.  This is without any type of diuretic onboard.  Otherwise, her review of systems is negative.   PHYSICAL EXAM:  Blood pressure is 131/68, with a pulse of 60.  The patient is oriented to person, time, and place, and her affect is  normal.  LUNGS:  Clear.  Respiratory  effort is not labored at rest.  HEENT:  Reveals no xanthelasma.  She has normal extraocular motion.  There are no carotid bruits.  There is no jugular venous distension.  CARDIAC:  Exam reveals an S1 with an S2.  There are no clicks or significant  murmurs.  ABDOMEN:  Obese, but soft.  The patient has 1 to 2+ peripheral edema.  There are no major  musculoskeletal deformities.   No other labs are done today.   PROBLEMS:  Problems are listed, numbers 1-9 on my note of May 22, 2006.  1. Ongoing shortness of breath.  We know that her 2D shows that she has      good left ventricular function.  She does have some pulmonary      hypertension.  She has good left ventricular function.  I feel that we      must try a diuretic.  She continues to be  hesitant because of the      history of leg cramps and we will start at a very low dose, and try to      see her back soon.  She has had a BMET in the last month or 2 and we do      not need to repeat today.  However, we will arrange for BMET in      approximately 1 week to be sure that her potassium remains stable, and      we will put her on Lasix only 20 mg a day and arrange for early      followup.  Ultimately, we will have to keep in mind whether we want to      rule out ischemia, and we will have to arrange, probably, for pulmonary      evaluation..   10. Recent urinary tract infection evaluated in the emergency room at [facility]      and treated with  an antibiotic, and this seems to be improved.  11. Moderate pulmonary hypertension.                               Carlena Bjornstad, MD, [facility]    JDK/MedQ  DD:  06/23/2006  DT:  06/24/2006  Job #:  [id number]   cc:   Jeanann Lewandowsky, M.D.

## 2011-02-15 NOTE — Op Note (Signed)
[name], FORNESS NO.:  [id number]   MEDICAL RECORD NO.:  [id number]          PATIENT TYPE:  INP   LOCATION:  [id number]                         FACILITY:  [facility]   PHYSICIAN:  Pietro Cassis. Alvan Dame, M.D.  DATE OF BIRTH:  1931-04-06   DATE OF PROCEDURE:  01/20/2005  DATE OF DISCHARGE:                                 OPERATIVE REPORT   PREOPERATIVE DIAGNOSIS:  Left bimalleolar and trimalleolar ankle fracture.   POSTOPERATIVE DIAGNOSIS:  Left ankle bimalleolar fracture that was closed.  The fracture appeared to be of a pronation/external rotation type.   FINDINGS:  Following stabilization of the fibula distally and laterally and  the medial malleolus with stress-view external rotation, there was no  opening on the medial mortis.  For this reason, no syndesmotic screw was  placed.   PROCEDURE:  Open reduction/internal fixation of left ankle fracture.   COMPONENTS USED:  Synthes small fragment set.  I had a double stack, 1/3  tubular plate with an 8-hole plate laterally and two 4.0 partially threaded  cancellous screws 45 mm in length each medially.   SURGEON:  Pietro Cassis. Alvan Dame, M.D.   ASSISTANT:  Surgical tech.   ANESTHESIA:  Spinal plus MAC.   COMPLICATIONS:  None but the patient did jump into atrial fibrillation with  a known history of paroxysmal atrial fibrillation.  She was placed on a  Diltiazem drip.   INDICATIONS FOR PROCEDURE:  [name] is a very pleasant 77 year old  female who unfortunately slipped on some wet grass during a rainstorm on  Saturday, April 22.  She did not have the ability to get up and actually was  outside for about 30-45 minutes before she could get help.  She was brought  to [facility] emergency room where her deformed ankle was reduced and  placed on a splint.  She was admitted based on social conditions and not  able to care for herself with nonweightbearing restrictions.  She was seen  the next day.  Review of her fracture  indicated the concern for a pronation-  type pattern, based on the fracture of the medial malleolus and the fibula.  Nevertheless, she had a fracture pattern that was unstable and required  surgical stabilization.  Consent was obtained.  Reviewed with her the risks  and benefits of bleeding, given the fact that she is on aspirins, nerve and  vessel injury.  I also reviewed with her, given the nature of her fracture  and the involvement of the articular cartilage, she could have problems long-  term with arthritis and instability of the ankle.  Consent was obtained.   PROCEDURE IN DETAIL:  Patient was brought to the operating theater.  Once  adequate anesthesia and preoperative antibiotics of 1 gm of Ancef were  administered, the patient was positioned supine and a bump was placed  underneath the left hip.  A proximal thigh tourniquet was placed.  The left  leg was then prepped and draped to the knee in a sterile fashion.  The toes  were covered with Ioban.  A lateral-based incision was made to expose the  fibula.  This was carried out in a standard fashion.  The incision was made,  less than 15 cm from the tip of the malleolus.  The superficial peroneal  nerve was not encountered.  Sharp dissection was carried down to expose the  fracture site.  The fracture pattern appeared to be in a pattern that was  not consistent with a supination pattern.  Nonetheless, it appeared near the  joint line.  It was roughly within 2 cm above the ankle mortis.  The  fracture was reduced anatomically by direct visualization followed by  radiographic evaluation.  Based on the nature of the bone in the fracture  pattern, I decided to double-stack of the tubular plates and place them  along the lateral border of the fibula and held in place with a lateral  trocar clamp.  I left three holes distal and five holes proximal, utilizing  three of each.  Radiographic evaluation revealed an anatomically induced   fibula.  Following this, bicortical screws were placed proximally, including  three bicortical screws.  Two bicortical screws were placed distally with a  single cancellous screw placed in the most distal slot.  At this point,  based on the evaluation, the ankle mortis appeared to be reduced nicely.  Nonetheless, there was instability based on the medial malleolar fracture.  At this point, medial malleolus exposure was carried out with a J-type  incision in a standard fashion.  Careful retraction of neurovascular  structures was done.  The fracture site was identified, and a small drill  hole was placed proximal, and bone clamp placed to hold the fracture  reduced.  At this point, radiographs were again obtained, showing anatomic  reduction of the AP and lateral plane.  At this point, 2.5 drill bits were  then passed posterior and anterior to the bone plant.  This was confirmed  radiographically, AP and laterally.  Two 45 mm 4-0 partially coated  cancellous screws were then passed in place with the drill bits.  Again,  radiographs were obtained.  Now with the ankle in a completely stabilized  fashion, the ankle mortis views appeared anatomic.  A stress view with an  external rotation of the mortis view indicated no medial clear-space  opening.  Given this, no syndesmotic screw was placed.  Though this was  considered in the perioperative period, stress radiographs indicated a  stable ankle mortis.   At this point, the wounds were copiously irrigated with normal saline  solution.  The lateral incision was closed in layers with 3-0 nylon along  the skin edges, same with the medial incision with 3-0 nylon.  The foot was  cleaned, dried, and dressed sterilely with Xeroform dressings, sponges, and  a bulky dressing using ABDs, cast padding, and a posterior double sugar-tong  splint.  The patient was then transferred to the recovery room in a stable condition.  She tolerated the procedure well  with no apparent complications.      MDO/MEDQ  D:  01/20/2005  T:  01/20/2005  Job:  [id number]

## 2011-02-15 NOTE — H&P (Signed)
NAMEADELINN, GUO NO.:  [id number]   MEDICAL RECORD NO.:  [id number]          PATIENT TYPE:  INP   LOCATION:  [id number]                         FACILITY:  [facility]   PHYSICIAN:  Pietro Cassis. Alvan Dame, M.D.  DATE OF BIRTH:  07/31/1931   DATE OF ADMISSION:  01/19/2005  DATE OF DISCHARGE:                                HISTORY & PHYSICAL   PRINCIPAL DIAGNOSIS:  Left displaced ankle fracture.   SECONDARY DIAGNOSES:  1.  Dyslipidemia.  2.  Hypertension.  3.  Borderline diabetes.   HISTORY OF PRESENT ILLNESS:  [name] [name] is a pleasant 77 year old female  who unfortunately slipped and fell on wet grass yesterday afternoon during  the wet rainstorm.  She unfortunately had to lay outside for approximately  45 minutes before she was able to elicit some help.  She subsequently was  brought to the emergency room, where she had a significant amount of pain,  inability to bear weight, and deformity in her left ankle.  She was  initially seen and evaluated in the emergency room, where they were able to  reduce her ankle significantly from a prereduction state.  She was noted to  have a trimalleolar-type ankle fracture pattern.   [name] [name] did not report any other acute injuries at this time.  She has  had no other history of ankle problems.   PAST MEDICAL HISTORY:  1.  Dyslipidemia.  2.  Hypertension.  3.  Borderline diabetes.  4.  History of some palpitations.   PAST SURGICAL HISTORY:  1.  Back surgery.  2.  Hysterectomy.   FAMILY HISTORY:  Includes coronary artery disease and tobacco use.   ALLERGIES:  No known drug allergies.   CURRENT MEDICATIONS:  1.  Aspirin 325 mg daily.  2.  Diovan 160 mg daily.  3.  Fortamet 1000 mg q.a.m. and 500 mg q.p.m.   REVIEW OF SYSTEMS:  Otherwise unremarkable.  She was hospitalized in  January, 2006 for some chest palpitations and was subsequently discharged  seven days later after being cleared from a cardiac standpoint.  She  has had  no problems since that time and has had a recent followup.   PHYSICAL EXAMINATION:  VITAL SIGNS:  Initial admission vital signs are  available through the emergency room chart.  GENERAL:  [name] [name] is an otherwise healthy-appearing 77 year old female.  She appears slightly older than her stated age.  She nevertheless is awake,  alert, oriented and cooperative.  HEAD/NECK:  She has normal facial musculature.  Her eye movements are  normal.  Neck is supple.  Without lymph nodes.  She has no bruits.  LUNGS:  Clear to auscultation.  HEART:  Regular rate and rhythm today with a small murmur detected.  ABDOMEN:  Soft and nontender without tenderness.  She has positive bowel  sounds.  EXTREMITIES:  She is in a posterior splint at this point.  She has brisk  capillary refill and intact sensibility.  She has pain with any other  movement of that ankle.  Right lower extremity is normal, in comparison,  with palpable pulses.  Knee exam reveals  no significant effusion.  No joint  instability noted.   LAB WORK:  The lab work that was ordered revealed that she had an INR of  0.9.  Her electrolytes were within normal range with a BUN and creatinine of  13 and 0.9 and a glucose of 322 at this time.  Her white count was 4.8,  hematocrit 37.3.   RADIOGRAPHS:  Ankle x-rays were reviewed from the emergency room, revealing  a displaced ankle fracture that appears to be of a pronation/external  rotation type with concern for syndesmotic disruption.   ASSESSMENT:  1.  Left ankle fracture, displaced.  2.  Borderline diabetes.  3.  Hypertension.   PLAN:  I have reviewed with [name] [name] her current situation.  Given the  fact that she lives alone, from a social standpoint, I think that we are  unable to discharge her to home care for nonoperative measures and elevation  of her lower extremity until surgery.  For that reason, we will plan on  taking her to the operating room for open  reduction/internal fixation  followed by leg elevation, close sugar monitoring, and possible assistance  from medical consultation to help manage this.  Physical therapy and  occupational therapy will be assess and recommend discharge planning, based  on nonweightbearing activity on the left lower extremity for a period of six  weeks.  Questions are encouraged and answered.  Consent will be obtained.      MDO/MEDQ  D:  01/20/2005  T:  01/20/2005  Job:  [id number]

## 2011-02-15 NOTE — Discharge Summary (Signed)
[facility]  Patient:    [name] [name], [name] [name]                   MRN: [id number] Adm. Date:  [id number] Disc. Date: 05/07/00 Attending:  Lorenza Evangelist Dictator:   Sharyl Nimrod, P.A.C. CC:         Dr. Carlis Abbott                  Referring Physician Discharge [place]:  March 30, 1931.  SUMMARY OF HISTORY:  [name] is a 77 year old black female who presented to the emergency room complaining of not feeling right, associated with palpitations.  On the morning of admission, she awoke to find that her feet, ankles and her hands were swollen.  When she wet to work, she started doing some errands and began to feel palpitations associated with dizziness like she was going to pass out.  She denied any chest discomfort, shortness of breath, diaphoresis or nausea.  She called her primary M.D., who referred her to the emergency room.  She was found to be in a narrow complex rapid rhythm and was placed on Cardizem drip and converted to normal sinus rhythm in the emergency room.  She also has a history of borderline hypertension and an ER visit in 1998 for a possible PAF and she was discharged home.  It is also noted that prior to admission, she has taken many multivitamins and herbal medications which she could not list.  LABORATORY AND X-RAY FINDINGS:  Fasting lipids showed a cholesterol of 179, triglycerides 290, HDL 24, LDL 97.  Sodium 141, potassium 4.2, BUN 10, creatinine 0.7, glucose 122.  Hemoglobin 13.4, hematocrit 37.6, normal indices, platelets 375,000, WBC 5.3.  Chest x-ray did not show any active disease.  Her EKG in the emergency room was consistent with atrial fibrillation, nonspecific ST-T wave abnormalities.  Ventricular rate was 155.  Multiple ER telemetry strips also were suspect for atrial fibrillation; however, she would also have periods of this narrow complex rapid rhythm that were very regular.  HOSPITAL COURSE:  Ms.  Anders Simmonds was admitted overnight for observation. Overnight, she ruled out for myocardial infarction and she remained in normal sinus rhythm with some PACs.  Her IV Cardizem drip was changed to p.o. Cardizem on the evening of the 7th.  After Dr. Carlena Bjornstad reviewed her chart on the 8th, it was felt that she could be discharged home on medications and it was not felt that anticoagulation was needed at this time.  DISCHARGE DIAGNOSES 1. Paroxysmal atrial fibrillation with increased ventricular rate; normal    sinus rhythm, post Cardizem. 2. Hypertension. 3. Hyperlipidemia.  DISPOSITION:  She is discharged home and asked to begin Cardizem CD 240 mg q.d. as well as a coated aspirin 325 mg q.d.  DIET:  She is asked to maintain a low-salt/-fat/-cholesterol diet.  ACTIVITIES:  Her activities were not restricted.  SPECIAL INSTRUCTIONS:  She was advised do not take pseudoephedrine products and to bring all her medications including the multivitamins and herbal medications on her followup appointment.  FOLLOWUP:  She will see Dr. Ron Parker in the office and prior to that visit, an echocardiogram will be obtained. DD:  05/07/00 TD:  05/07/00 Job: [id number]

## 2011-02-15 NOTE — Discharge Summary (Signed)
[name] [name], THRONEBERRY NO.:  [id number]   MEDICAL RECORD NO.:  [id number]          PATIENT TYPE:  INP   LOCATION:  [id number]                         [facility]ACILITY:  [facility]   PHYSICIAN:  Pietro Cassis. Alvan Dame, M.D.  DATE OF BIRTH:  [birth year]   DATE OF ADMISSION:  01/19/2005  DATE OF DISCHARGE:  01/23/2005                                 DISCHARGE SUMMARY   ADMITTING DIAGNOSES:  1.  Displaced left ankle fracture.  2.  Borderline diabetes.  3.  Hypertension.  4.  Dyslipidemia.  5.  History of cardiac palpitations with paroxysmal atrial fibrillation.   DISCHARGE DIAGNOSES:  1.  Displaced left ankle fracture.  2.  Borderline diabetes  3.  Hypertension.  4.  Dyslipidemia.  5.  History of cardiac palpitations with paroxysmal atrial fibrillation.   OPERATION:  On January 20, 2005, the patient underwent open reduction and  internal fixation of a left bimalleolar fracture by Dr. Alvan Dame.   CONSULTS:  Glori Bickers, M.D.  for cardiac consult for Dola Argyle,  M.D.   BRIEF HISTORY:  This 77 year old lady slipped and fell on wet grass the  afternoon prior to admission. Unfortunately she was in the yard for about45  minutes in the rain when she finally got somme help.  She is unable to  weightbear, was brought to the emergency room where x-rays showed a  trimalleolar-type ankle fracture pattern.  She had no other injuries at the  time, was awake and alert. We asked for a cardiac consult due to her cardiac  history. Glori Bickers, M.D. saw the patient and followed her throughout  her hospitalization along with Dola Argyle, M.D., et al.   She had no cardiac incident, was kept on telemetry for safety reasons. Dr.  Ron Parker and that group changed the medications, mainly to stabilize the patient  with Lopressor.   Orthopedically, she neurovascularly remained intact to the operative  extremity in the toes.  The cast/splint remained intact as well. Advanced  home care was arranged for the  patient to assist her at home. We, therefore,  once this was in place and she was stable, doing some physical therapy on  her own, and it was she could maintain her home environment, arrangements  were made for discharge. [facility] saw her prior to discharge and  will follow up with her concerning her arrhythmias.   LABORATORY VALUES IN THE HOSPITAL:  Hematologically showed a CBC with  differential completely within normal limits. Her glucose was elevated on  her admission at 322. This came down to 154. Electrocardiogram showed atrial  fibrillation with rapid ventricular response. This converted to sinus rhythm  with premature atrial complexes and on 22 January 2005, she had normal sinus  rhythm with sinus arrhythmias. Chest x-ray showed moderate hiatal hernia  with no active disease.   CONDITION ON DISCHARGE:  Improved, stable.   DATA REVIEWED:  1.  Vicodin for discomfort.  2.  Robaxin for muscle relaxant.  3.  Keflex 500 mg  q.i.d. for five days.  4.  Lopressor per cardiology.   FOLLOW UP:  1.  Dola Argyle, M.D.,  [place].  2.  Return to see Pietro Cassis. Alvan Dame, M.D. two weeks after surgery.   DISCHARGE INSTRUCTIONS:  Keep her splint clean and dry. Keep it elevated.  She is to continue with other medications and diet.      DLU/MEDQ  D:  01/31/2005  T:  01/31/2005  Job:  [id number]   cc:   Dola Argyle, M.D.

## 2011-02-23 ENCOUNTER — Emergency Department (HOSPITAL_COMMUNITY): Payer: No Typology Code available for payment source

## 2011-02-23 ENCOUNTER — Emergency Department (HOSPITAL_COMMUNITY)
Admission: EM | Admit: 2011-02-23 | Discharge: 2011-02-23 | Disposition: A | Discharge status: Home / Self Care | Payer: No Typology Code available for payment source | Attending: Emergency Medicine | Admitting: Emergency Medicine

## 2011-02-23 DIAGNOSIS — M545 Low back pain, unspecified: Secondary | ICD-10-CM | POA: Insufficient documentation

## 2011-02-23 DIAGNOSIS — M79609 Pain in unspecified limb: Secondary | ICD-10-CM

## 2011-02-23 DIAGNOSIS — Z950 Presence of cardiac pacemaker: Secondary | ICD-10-CM | POA: Insufficient documentation

## 2011-02-23 DIAGNOSIS — Z7982 Long term (current) use of aspirin: Secondary | ICD-10-CM | POA: Insufficient documentation

## 2011-02-23 DIAGNOSIS — Z853 Personal history of malignant neoplasm of breast: Secondary | ICD-10-CM | POA: Insufficient documentation

## 2011-02-23 DIAGNOSIS — G8929 Other chronic pain: Secondary | ICD-10-CM | POA: Insufficient documentation

## 2011-02-23 DIAGNOSIS — I10 Essential (primary) hypertension: Secondary | ICD-10-CM | POA: Insufficient documentation

## 2011-02-23 DIAGNOSIS — Z7901 Long term (current) use of anticoagulants: Secondary | ICD-10-CM | POA: Insufficient documentation

## 2011-02-23 DIAGNOSIS — E119 Type 2 diabetes mellitus without complications: Secondary | ICD-10-CM | POA: Insufficient documentation

## 2011-02-23 DIAGNOSIS — Z86718 Personal history of other venous thrombosis and embolism: Secondary | ICD-10-CM | POA: Insufficient documentation

## 2011-02-23 DIAGNOSIS — Z79899 Other long term (current) drug therapy: Secondary | ICD-10-CM | POA: Insufficient documentation

## 2011-02-23 LAB — BASIC METABOLIC PANEL
CO2: 25 mEq/L (ref 19–32)
Calcium: 10.1 mg/dL (ref 8.4–10.5)
Creatinine, Ser: 1.12 mg/dL (ref 0.4–1.2)
GFR calc non Af Amer: 47 mL/min — ABNORMAL LOW (ref >60)
Glucose, Bld: 142 mg/dL — ABNORMAL HIGH (ref 70–99)

## 2011-02-23 LAB — CBC
HCT: 38.9 % (ref 36.0–46.0)
Hemoglobin: 13.2 g/dL (ref 12.0–15.0)
MCHC: 33.9 g/dL (ref 30.0–36.0)
MCV: 87.4 fL (ref 78.0–100.0)
WBC: 4.5 10*3/uL (ref 4.0–10.5)

## 2011-02-23 LAB — PROTIME-INR: INR: 2.11 — ABNORMAL HIGH (ref 0.00–1.49)

## 2011-02-23 LAB — D-DIMER, QUANTITATIVE: D-Dimer, Quant: 0.32 ug/mL-FEU (ref 0.00–0.48)

## 2011-02-23 LAB — CK TOTAL AND CKMB (NOT AT ARMC)
CK, MB: 2 ng/mL (ref 0.3–4.0)
Total CK: 64 U/L (ref 7–177)

## 2011-02-23 LAB — TROPONIN I: Troponin I: <0.3 ng/mL (ref <0.30)

## 2011-03-04 ENCOUNTER — Ambulatory Visit (INDEPENDENT_AMBULATORY_CARE_PROVIDER_SITE_OTHER): Payer: Medicare Other | Admitting: *Deleted

## 2011-03-04 DIAGNOSIS — Z8679 Personal history of other diseases of the circulatory system: Secondary | ICD-10-CM

## 2011-03-04 DIAGNOSIS — I2699 Other pulmonary embolism without acute cor pulmonale: Secondary | ICD-10-CM

## 2011-03-04 DIAGNOSIS — Z86718 Personal history of other venous thrombosis and embolism: Secondary | ICD-10-CM

## 2011-03-04 DIAGNOSIS — I82409 Acute embolism and thrombosis of unspecified deep veins of unspecified lower extremity: Secondary | ICD-10-CM

## 2011-03-04 DIAGNOSIS — I4892 Unspecified atrial flutter: Secondary | ICD-10-CM

## 2011-03-13 ENCOUNTER — Other Ambulatory Visit: Payer: Self-pay | Admitting: *Deleted

## 2011-03-13 MED ORDER — DILTIAZEM HCL 240 MG PO CP24: 240.0000 mg | ORAL_CAPSULE | Freq: Every day | ORAL | Status: DC

## 2011-03-21 ENCOUNTER — Telehealth: Payer: Self-pay | Admitting: *Deleted

## 2011-03-21 NOTE — Telephone Encounter (Signed)
Pt telephoned to called inform us that she presently taking Augmentin 500/125mg s BID for 7 days due to toe infection.

## 2011-04-01 ENCOUNTER — Ambulatory Visit (INDEPENDENT_AMBULATORY_CARE_PROVIDER_SITE_OTHER): Payer: Medicare Other | Admitting: *Deleted

## 2011-04-01 DIAGNOSIS — Z86718 Personal history of other venous thrombosis and embolism: Secondary | ICD-10-CM

## 2011-04-01 DIAGNOSIS — I2699 Other pulmonary embolism without acute cor pulmonale: Secondary | ICD-10-CM

## 2011-04-01 DIAGNOSIS — I82409 Acute embolism and thrombosis of unspecified deep veins of unspecified lower extremity: Secondary | ICD-10-CM

## 2011-04-01 DIAGNOSIS — I4892 Unspecified atrial flutter: Secondary | ICD-10-CM

## 2011-04-01 DIAGNOSIS — Z8679 Personal history of other diseases of the circulatory system: Secondary | ICD-10-CM

## 2011-04-23 ENCOUNTER — Encounter: Payer: Self-pay | Admitting: Vascular Surgery

## 2011-04-23 NOTE — Progress Notes (Signed)
[facility] [place]  Referred by: Dr. Coralie Common  Reason for referral: slowly healing bilateral great toe ulcers  History of Present Illness  [name] [name] is a 77 y.o. female who presents with chief complaint: poorly healing bilateral great toes.  Onset of symptoms occurred, 1 month ago.  She developed blisters at on each great toe which over the next month ruptured and failed to heal.  The patient has no rest pain or intermittent claudication symptoms also and prior to this episode has never had leg wounds/ulcers.  She denies fever, chills or drainage.  Atherosclerotic risk factors include: HTN, DM.  Past Medical History  Diagnosis Date  . Hypertension   . Diabetes mellitus   . Tachy-brady syndrome     Past Surgical History  Procedure Date  . Breast surgery   . Back surgery   . Abdominal hysterectomy   . Back surgery     1973  . Ankle surgery     History   Social History  . Marital Status: Widowed    Spouse Name: N/A    Number of Children: N/A  . Years of Education: N/A   Occupational History  . Not on file.   Social History Main Topics  . Smoking status: Former Smoker -- 4.0 packs/day    Quit date: 10/26/1998  . Smokeless tobacco: Never Used  . Alcohol Use: No  . Drug Use: No  . Sexually Active: Not on file   Other Topics Concern  . Not on file   Social History Narrative  . No narrative on file    Family History  Problem Relation Age of Onset  . Heart disease Mother   . COPD Father     Current outpatient prescriptions:anastrozole (ARIMIDEX) 1 MG tablet, Take 1 mg by mouth daily.  , Disp: , Rfl: ;  aspirin 81 MG tablet, Take 81 mg by mouth daily.  , Disp: , Rfl: ;  calcium-vitamin D (OSCAL WITH D) 250-125 MG-UNIT per tablet, Take 1 tablet by mouth daily.  , Disp: , Rfl: ;  diltiazem (CARDIZEM CD) 240 MG 24 hr capsule, Take 240 mg by mouth daily.  , Disp: , Rfl:  diltiazem (DILACOR XR) 240 MG 24 hr capsule, Take 1 capsule  (240 mg total) by mouth daily., Disp: 30 capsule, Rfl: 11;  flecainide (TAMBOCOR) 100 MG tablet, Take 100 mg by mouth 2 (two) times daily.  , Disp: , Rfl: ;  furosemide (LASIX) 20 MG tablet, Take 20 mg by mouth 2 (two) times daily.  , Disp: , Rfl: ;  metFORMIN (GLUMETZA) 500 MG (MOD) 24 hr tablet, Take 500 mg by mouth daily with breakfast.  , Disp: , Rfl:  metoprolol (TOPROL-XL) 100 MG 24 hr tablet, Take 100 mg by mouth daily.  , Disp: , Rfl: ;  pyridoxine (B-6) 100 MG tablet, Take 200 mg by mouth daily.  , Disp: , Rfl: ;  valsartan (DIOVAN) 160 MG tablet, Take 160 mg by mouth daily.  , Disp: , Rfl: ;  vitamin B-12 (CYANOCOBALAMIN) 500 MCG tablet, Take 500 mcg by mouth daily.  , Disp: , Rfl: ;  warfarin (COUMADIN) 5 MG tablet, Take by mouth as directed.  , Disp: , Rfl:   Allergies as of 04/26/2011 - Review Complete 04/26/2011  Allergen Reaction Noted  . Iohexol  02/11/2005    Review of Systems (Positive items in bold and italic, otherwise negative)  General: Weight loss, Weight gain, Loss of appetite, Fever  Neurologic:  Dizziness, Blackouts, Headaches, Seizure  Ear/Nose/Throat: Change in eyesight, Change in hearing, Nose bleeds, Sore throat  Vascular: Pain in legs with walking, Pain in feet while lying flat, Non-healing ulcer, Stroke, "Mini stroke", Slurred speech, Temporary blindness, Blood clot in vein, Phlebitis  Pulmonary: Home oxygen, Productive cough, Bronchitis, Coughing up blood, Asthma, Wheezing  Musculoskeletal: Arthritis, Joint pain, Muscle pain  Cardiac: Chest pain, Chest tightness/pressure, Shortness of breath when lying flat, Shortness of breath with exertion, Palpitations, Heart murmur, Arrythmia, Atrial fibrillation  Hematologic: Bleeding problems, Clotting disorder, Anemia  Psychiatric:  Depression, Anxiety, Attention deficit disorder  Gastrointestinal:  Black stool, Blood in stool, Peptic ulcer disease, Reflux, Hiatal hernia, Trouble swallowing, Diarrhea,  Constipation  Urinary:  Kidney disease, Burning with urination, Frequent urination, Difficulty urinating  Skin: Ulcers, Rashes  Physical Examination  Filed Vitals:   04/26/11 1139  BP: 121/75  Pulse: 68  Resp: 18    General: A&O x 3, WDWN  Head: [place]/AT  Ear/Nose/Throat: Hearing grossly intact, nares w/o erythema or drainage, oropharynx w/o Erythema/Exudate  Eyes: PERRLA, EOMI  Neck: Supple, no nuchal rigidity, no palpable LAD  Pulmonary: Sym exp, good air movt, CTAB, no rales, rhonchi, & wheezing  Cardiac: RRR, Nl S1, S2, no Murmurs, rubs or gallops  Vascular: Vessel Right Left  Radial Palpable Palpable  Ulnary Palpable Palpable  Brachial Palpable Palpable  Carotid Palpable, without bruit Palpable, without bruit  Aorta Non-palpable N/A  Femoral Palpable Palpable  Popliteal Non-palpable Non-palpable  PT Faintly Palpable Faintly Palpable  DP Faintly Palpable Faintly Palpable   Gastrointestinal: soft, NTND, -G/R, - HSM, - masses, - CVAT B  Musculoskeletal: M/S 5/5 throughout, Bilateral great toe slightly more pallor than rest of foot, no cyanosis, shallow non-infected ulcers on both great toes, callus on plantar surfaces, L > R edema, L lipdodermatosclerosis, small left varicosities  Neurologic: CN 2-12 intact, Pain and light touch intact in extremities, Motor exam as listed above  Psychiatric: Judgment intact, Mood & affect appropriatefor pt's clinical situation  Dermatologic: See M/S exam for extremity exam, no rashes otherwise noted  Lymph : No Cervical, Axillary, or Inguinal lymphadenopathy   Non-Invasive Vascular Imaging  ABI (Date: 04/26/11)  RLE: 0.98, DP & PT biphasic and triphasic, TBI not done due to ulcers  LLE: 1.04, DP & PT biphasic and triphasic, TBI not done due to ulcers   Outside Studies/Documentation 5 pages of outside documents were reviewed.  Medical Decision Making  [name] [name] is a 77 y.o. female who presents with: Bilateral  great toe ulcers in setting of intact perfusion to both feet and Chronic venous insufficiency   With triphasic flow to both feet, she should heal any interventions on each great toe.  I suspect her slow healing is a combination of her diabetes and CVI.  She likely need local debridement with local wound care +/- antibiotics.  I will defer management of the toes to her podiatrist.  Once she heals her toes, she should resume BLE compression stockings for her CVI.  I discussed in depth with the patient the nature of atherosclerosis, and emphasized the importance of maximal medical management including strict control of blood pressure, blood glucose, and lipid levels, obtaining regular exercise, and cessation of smoking.  The patient is aware that without maximal medical management the underlying atherosclerotic disease process will progress, limiting the benefit of any interventions.  Thank you for allowing [place] to participate in this patient's care.  Adele Barthel, MD [facility] [place] Office: [phone] Pager:  [phone]    

## 2011-04-26 ENCOUNTER — Encounter: Payer: Self-pay | Admitting: Vascular Surgery

## 2011-04-26 ENCOUNTER — Encounter (INDEPENDENT_AMBULATORY_CARE_PROVIDER_SITE_OTHER): Payer: Medicare Other

## 2011-04-26 ENCOUNTER — Ambulatory Visit (INDEPENDENT_AMBULATORY_CARE_PROVIDER_SITE_OTHER): Payer: Medicare Other | Admitting: Vascular Surgery

## 2011-04-26 DIAGNOSIS — L97509 Non-pressure chronic ulcer of other part of unspecified foot with unspecified severity: Secondary | ICD-10-CM

## 2011-04-26 DIAGNOSIS — I872 Venous insufficiency (chronic) (peripheral): Secondary | ICD-10-CM

## 2011-04-26 DIAGNOSIS — L97909 Non-pressure chronic ulcer of unspecified part of unspecified lower leg with unspecified severity: Secondary | ICD-10-CM

## 2011-04-29 ENCOUNTER — Ambulatory Visit (INDEPENDENT_AMBULATORY_CARE_PROVIDER_SITE_OTHER): Payer: Medicare Other | Admitting: *Deleted

## 2011-04-29 DIAGNOSIS — I4892 Unspecified atrial flutter: Secondary | ICD-10-CM

## 2011-04-29 DIAGNOSIS — I82409 Acute embolism and thrombosis of unspecified deep veins of unspecified lower extremity: Secondary | ICD-10-CM

## 2011-04-29 DIAGNOSIS — Z8679 Personal history of other diseases of the circulatory system: Secondary | ICD-10-CM

## 2011-04-29 DIAGNOSIS — Z86718 Personal history of other venous thrombosis and embolism: Secondary | ICD-10-CM

## 2011-04-29 DIAGNOSIS — I2699 Other pulmonary embolism without acute cor pulmonale: Secondary | ICD-10-CM

## 2011-05-10 ENCOUNTER — Emergency Department (HOSPITAL_COMMUNITY)
Admission: EM | Admit: 2011-05-10 | Discharge: 2011-05-10 | Disposition: A | Discharge status: Home / Self Care | Payer: Medicare Other | Attending: Emergency Medicine | Admitting: Emergency Medicine

## 2011-05-10 DIAGNOSIS — Z7901 Long term (current) use of anticoagulants: Secondary | ICD-10-CM | POA: Insufficient documentation

## 2011-05-10 DIAGNOSIS — Z950 Presence of cardiac pacemaker: Secondary | ICD-10-CM | POA: Insufficient documentation

## 2011-05-10 DIAGNOSIS — X58XXXA Exposure to other specified factors, initial encounter: Secondary | ICD-10-CM | POA: Insufficient documentation

## 2011-05-10 DIAGNOSIS — I10 Essential (primary) hypertension: Secondary | ICD-10-CM | POA: Insufficient documentation

## 2011-05-10 DIAGNOSIS — M545 Low back pain, unspecified: Secondary | ICD-10-CM | POA: Insufficient documentation

## 2011-05-10 DIAGNOSIS — Z86718 Personal history of other venous thrombosis and embolism: Secondary | ICD-10-CM | POA: Insufficient documentation

## 2011-05-10 DIAGNOSIS — S335XXA Sprain of ligaments of lumbar spine, initial encounter: Secondary | ICD-10-CM | POA: Insufficient documentation

## 2011-05-10 DIAGNOSIS — E119 Type 2 diabetes mellitus without complications: Secondary | ICD-10-CM | POA: Insufficient documentation

## 2011-05-20 ENCOUNTER — Other Ambulatory Visit: Payer: Self-pay | Admitting: Podiatry

## 2011-05-27 ENCOUNTER — Encounter: Payer: Medicare Other | Admitting: *Deleted

## 2011-05-29 ENCOUNTER — Ambulatory Visit (INDEPENDENT_AMBULATORY_CARE_PROVIDER_SITE_OTHER): Payer: Medicare Other | Admitting: *Deleted

## 2011-05-29 DIAGNOSIS — Z8679 Personal history of other diseases of the circulatory system: Secondary | ICD-10-CM

## 2011-05-29 DIAGNOSIS — I4892 Unspecified atrial flutter: Secondary | ICD-10-CM

## 2011-05-29 DIAGNOSIS — I2699 Other pulmonary embolism without acute cor pulmonale: Secondary | ICD-10-CM

## 2011-05-29 DIAGNOSIS — I82409 Acute embolism and thrombosis of unspecified deep veins of unspecified lower extremity: Secondary | ICD-10-CM

## 2011-05-29 DIAGNOSIS — Z86718 Personal history of other venous thrombosis and embolism: Secondary | ICD-10-CM

## 2011-06-07 ENCOUNTER — Ambulatory Visit (INDEPENDENT_AMBULATORY_CARE_PROVIDER_SITE_OTHER): Payer: Medicare Other | Admitting: *Deleted

## 2011-06-07 DIAGNOSIS — Z86718 Personal history of other venous thrombosis and embolism: Secondary | ICD-10-CM

## 2011-06-07 DIAGNOSIS — I2699 Other pulmonary embolism without acute cor pulmonale: Secondary | ICD-10-CM

## 2011-06-07 DIAGNOSIS — I82409 Acute embolism and thrombosis of unspecified deep veins of unspecified lower extremity: Secondary | ICD-10-CM

## 2011-06-07 DIAGNOSIS — Z8679 Personal history of other diseases of the circulatory system: Secondary | ICD-10-CM

## 2011-06-07 DIAGNOSIS — I4892 Unspecified atrial flutter: Secondary | ICD-10-CM

## 2011-06-17 ENCOUNTER — Emergency Department (HOSPITAL_COMMUNITY): Payer: Medicare Other

## 2011-06-17 ENCOUNTER — Other Ambulatory Visit (HOSPITAL_COMMUNITY): Payer: Medicare Other

## 2011-06-17 ENCOUNTER — Inpatient Hospital Stay (HOSPITAL_COMMUNITY)
Admission: EM | Admit: 2011-06-17 | Discharge: 2011-06-20 | Disposition: A | Discharge status: Home / Self Care | Payer: Medicare Other | Source: Home / Self Care | Attending: Internal Medicine | Admitting: Internal Medicine

## 2011-06-17 DIAGNOSIS — M48061 Spinal stenosis, lumbar region without neurogenic claudication: Secondary | ICD-10-CM | POA: Diagnosis present

## 2011-06-17 DIAGNOSIS — G8929 Other chronic pain: Secondary | ICD-10-CM | POA: Diagnosis present

## 2011-06-17 DIAGNOSIS — Z950 Presence of cardiac pacemaker: Secondary | ICD-10-CM

## 2011-06-17 DIAGNOSIS — E1142 Type 2 diabetes mellitus with diabetic polyneuropathy: Secondary | ICD-10-CM | POA: Diagnosis present

## 2011-06-17 DIAGNOSIS — M8448XA Pathological fracture, other site, initial encounter for fracture: Secondary | ICD-10-CM | POA: Diagnosis present

## 2011-06-17 DIAGNOSIS — Z7901 Long term (current) use of anticoagulants: Secondary | ICD-10-CM

## 2011-06-17 DIAGNOSIS — I959 Hypotension, unspecified: Secondary | ICD-10-CM | POA: Diagnosis not present

## 2011-06-17 DIAGNOSIS — Z86711 Personal history of pulmonary embolism: Secondary | ICD-10-CM

## 2011-06-17 DIAGNOSIS — R339 Retention of urine, unspecified: Secondary | ICD-10-CM | POA: Diagnosis not present

## 2011-06-17 DIAGNOSIS — I4891 Unspecified atrial fibrillation: Secondary | ICD-10-CM | POA: Diagnosis present

## 2011-06-17 DIAGNOSIS — L97509 Non-pressure chronic ulcer of other part of unspecified foot with unspecified severity: Secondary | ICD-10-CM | POA: Diagnosis present

## 2011-06-17 DIAGNOSIS — K5900 Constipation, unspecified: Secondary | ICD-10-CM | POA: Diagnosis not present

## 2011-06-17 DIAGNOSIS — Z7982 Long term (current) use of aspirin: Secondary | ICD-10-CM

## 2011-06-17 DIAGNOSIS — E1149 Type 2 diabetes mellitus with other diabetic neurological complication: Secondary | ICD-10-CM | POA: Diagnosis present

## 2011-06-17 DIAGNOSIS — I10 Essential (primary) hypertension: Secondary | ICD-10-CM | POA: Diagnosis present

## 2011-06-17 DIAGNOSIS — Z853 Personal history of malignant neoplasm of breast: Secondary | ICD-10-CM

## 2011-06-17 DIAGNOSIS — R159 Full incontinence of feces: Secondary | ICD-10-CM | POA: Diagnosis present

## 2011-06-17 DIAGNOSIS — Z79899 Other long term (current) drug therapy: Secondary | ICD-10-CM

## 2011-06-17 DIAGNOSIS — I517 Cardiomegaly: Secondary | ICD-10-CM | POA: Diagnosis present

## 2011-06-17 DIAGNOSIS — I495 Sick sinus syndrome: Secondary | ICD-10-CM | POA: Diagnosis present

## 2011-06-17 DIAGNOSIS — Z86718 Personal history of other venous thrombosis and embolism: Secondary | ICD-10-CM

## 2011-06-17 DIAGNOSIS — M47817 Spondylosis without myelopathy or radiculopathy, lumbosacral region: Principal | ICD-10-CM | POA: Diagnosis present

## 2011-06-17 LAB — CBC
MCH: 28.4 pg (ref 26.0–34.0)
MCHC: 32.5 g/dL (ref 30.0–36.0)
MCV: 87.5 fL (ref 78.0–100.0)
Platelets: 353 10*3/uL (ref 150–400)
RDW: 14.1 % (ref 11.5–15.5)
WBC: 4.3 10*3/uL (ref 4.0–10.5)

## 2011-06-17 LAB — DIFFERENTIAL
Basophils Absolute: 0.1 10*3/uL (ref 0.0–0.1)
Eosinophils Absolute: 0.2 10*3/uL (ref 0.0–0.7)
Lymphocytes Relative: 24 % (ref 12–46)
Lymphs Abs: 1 10*3/uL (ref 0.7–4.0)
Neutro Abs: 2.6 10*3/uL (ref 1.7–7.7)

## 2011-06-17 LAB — URINALYSIS, ROUTINE W REFLEX MICROSCOPIC
Bilirubin Urine: NEGATIVE
Glucose, UA: NEGATIVE mg/dL
Hgb urine dipstick: NEGATIVE
Ketones, ur: NEGATIVE mg/dL
pH: 6 (ref 5.0–8.0)

## 2011-06-17 LAB — BASIC METABOLIC PANEL
BUN: 14 mg/dL (ref 6–23)
CO2: 26 mEq/L (ref 19–32)
Chloride: 101 mEq/L (ref 96–112)
Creatinine, Ser: 0.97 mg/dL (ref 0.50–1.10)

## 2011-06-17 LAB — OCCULT BLOOD, POC DEVICE: Fecal Occult Bld: NEGATIVE

## 2011-06-17 LAB — GLUCOSE, CAPILLARY: Glucose-Capillary: 120 mg/dL — ABNORMAL HIGH (ref 70–99)

## 2011-06-18 ENCOUNTER — Inpatient Hospital Stay (HOSPITAL_COMMUNITY): Payer: Medicare Other

## 2011-06-18 LAB — GLUCOSE, CAPILLARY
Glucose-Capillary: 104 mg/dL — ABNORMAL HIGH (ref 70–99)
Glucose-Capillary: 107 mg/dL — ABNORMAL HIGH (ref 70–99)
Glucose-Capillary: 179 mg/dL — ABNORMAL HIGH (ref 70–99)

## 2011-06-18 LAB — COMPREHENSIVE METABOLIC PANEL
AST: 15 U/L (ref 0–37)
Alkaline Phosphatase: 106 U/L (ref 39–117)
BUN: 15 mg/dL (ref 6–23)
CO2: 27 mEq/L (ref 19–32)
Chloride: 101 mEq/L (ref 96–112)
Creatinine, Ser: 0.94 mg/dL (ref 0.50–1.10)
GFR calc non Af Amer: 58 mL/min — ABNORMAL LOW (ref >60)
Potassium: 3.7 mEq/L (ref 3.5–5.1)
Total Bilirubin: 0.7 mg/dL (ref 0.3–1.2)

## 2011-06-18 LAB — CBC
HCT: 32.9 % — ABNORMAL LOW (ref 36.0–46.0)
Hemoglobin: 11 g/dL — ABNORMAL LOW (ref 12.0–15.0)
MCV: 86.1 fL (ref 78.0–100.0)
RBC: 3.82 MIL/uL — ABNORMAL LOW (ref 3.87–5.11)
RDW: 14 % (ref 11.5–15.5)
WBC: 3.3 10*3/uL — ABNORMAL LOW (ref 4.0–10.5)

## 2011-06-18 LAB — DIFFERENTIAL
Basophils Relative: 3 % — ABNORMAL HIGH (ref 0–1)
Eosinophils Relative: 3 % (ref 0–5)
Monocytes Absolute: 0.4 10*3/uL (ref 0.1–1.0)
Monocytes Relative: 11 % (ref 3–12)
Neutrophils Relative %: 59 % (ref 43–77)

## 2011-06-18 LAB — PHOSPHORUS: Phosphorus: 3.9 mg/dL (ref 2.3–4.6)

## 2011-06-18 LAB — PROTIME-INR
INR: 1.37 (ref 0.00–1.49)
Prothrombin Time: 17.1 seconds — ABNORMAL HIGH (ref 11.6–15.2)

## 2011-06-18 LAB — HEPARIN LEVEL (UNFRACTIONATED)
Heparin Unfractionated: <0.1 IU/mL — ABNORMAL LOW (ref 0.30–0.70)
Heparin Unfractionated: 0.12 IU/mL — ABNORMAL LOW (ref 0.30–0.70)

## 2011-06-18 LAB — CK TOTAL AND CKMB (NOT AT ARMC)
Relative Index: INVALID (ref 0.0–2.5)
Total CK: 62 U/L (ref 7–177)

## 2011-06-18 LAB — URINE CULTURE: Culture  Setup Time: 201209171408

## 2011-06-18 LAB — TROPONIN I: Troponin I: <0.3 ng/mL (ref <0.30)

## 2011-06-18 NOTE — H&P (Signed)
[name] [name], GRIFFUS NO.:  [id number]  MEDICAL RECORD NO.:  [id number]  LOCATION:  26                         FACILITY:  [facility]  PHYSICIAN:  Carmen Tolliver, DO         DATE OF BIRTH:  Jun 30, 1931  DATE OF ADMISSION:  06/17/2011 DATE OF DISCHARGE:                             HISTORY & PHYSICAL   PRIMARY CARE PHYSICIAN:  Jeanann Lewandowsky, M.D.  CHIEF COMPLAINTS:  Rectal bleeding.  HISTORY OF PRESENT ILLNESS:  Patient is a 77 year old female who actually presented in the emergency department because she saw some blood with some stool in her underwear this morning.  On exam, patient was found to be actually heme negative from below but she was also found to have no rectal tone on exam.  Patient states that this morning was the first time that she has ever had a bowel movement without ever having any sensation of having a bowel movement.  She is found to have T11-T12 compression fractures and degenerative changes throughout her thoracic spine as well as osteoporosis and severe lumbar spinal stenosis. A surgeon was consulted for neurosurgery, back surgery, and he would like to work this up further with myelogram; however, patient is on Coumadin due to her tachybrady syndrome and so he wants the patient admitted to Medicine to manage her anticoagulation.  PAST MEDICAL HISTORY:  Significant for: 1. Tachybrady syndrome. 2. Pacemaker anticoagulation for her tachybrady syndrome. 3. Hypertension. 4. Diabetes mellitus type 2. 5. Breast cancer. 6. Degenerative disk disease.  PAST SURGICAL HISTORY: 1. The patient has had back surgery in the past. 2. She has had left ankle fracture surgery. 3. Total abdominal hysterectomy. 4. Left mastectomy.  MEDICATIONS AT HOME:  Medications at home include anastrozole oral, dosage and frequency unknown; aspirin oral, dosage and frequency unknown; Cardizem oral, dosage and frequency unknown; Diovan oral, dosage of frequency unknown,  flecainide oral, dosage of frequency unknown, furosemide oral, dosage of frequency unknown, metformin oral, dosage of frequency unknown,  metoprolol oral, dosage of frequency unknown and warfarin, dosage of frequency unknown.  ALLERGIES:  NKDA.  REVIEW OF SYSTEMS:  CONSTITUTIONAL:  Negative for fever.  Negative for chills.  Negative for weakness.  Positive for fatigue.  CNS:  No headaches.  No seizures.  No numbness.  ENT:  No nasal congestion.  No throat pain.  No coryza.  CARDIOVASCULAR:  No chest pain or palpitations.  No orthopnea.  RESPIRATORY:  No cough.  No shortness of breath.  No wheezing.  GASTROINTESTINAL:  Positive for nausea.  Negative for vomiting.  Negative for constipation.  Negative for diarrhea. GENITOURINARY:  No dysuria. No hematuria.  No urinary frequency.  RENAL: No flank pain.  No swelling.  No pruritus.  SKIN:  Positive for chronic skin color change to her left ankle area, otherwise no rashes.  Positive for sores on the tips of her great toes bilaterally.  Ulcerations which are being cared for by a podiatrist.  Otherwise, no rashes.  No sores or lesions.  HEMATOLOGICAL:  No easy bruising.  No purpura.  No clots. LYMPHS:  No lymphadenopathy.  No painful symptoms, specific lymph swelling.  PSYCHIATRIC:  Positive for anxiety.  Positive for depression.  Patient is upset.  Her older sister who raised her has been missing for 8 days, so she is very upset about that.  Positive for insomnia.  FAMILY HISTORY:  Mother died at age 84 and the patient states that her children drove her crazy and that is why she died.  Father died at age 70 of old age.  SOCIAL HISTORY:  Patient is a 40 pack-year smoking history.  She quit smoking in 2001.  No alcohol.  No recreational drug use.  PHYSICAL EXAM:  VITALS:  Temperature 98.1, pulse 60, respiratory rate 19, blood pressure 159/77. GENERAL:  Patient is awake, alert, and oriented x3.  She is able to give a fair history. EYES:   Pupils equal, round and react to light and accommodation. External ocular movements are bilaterally intact.  Sclera nonicteric, noninjected.  Mouth, oral mucosa is dry.  No lesions.  No sores. Pharynx, clean, no erythema, no exudate. NECK:  Negative for JVD.  Negative for thyromegaly.  Negative for lymphadenopathy. HEART:  Regular rate and rhythm at 60 beats per minute without murmur, ectopy, or gallops.  No lateral PMI.  No thrills. LUNGS:  Clear to auscultation bilaterally without wheezes, rales, or rhonchi.  No increased work of breathing.  No tactile fremitus. ABDOMEN:  Soft, nontender, nondistended.  Positive bowel sounds.  No hepatosplenomegaly.  No hernias palpated. EXTREMITIES:  Patient has 1 to 2+ pitting edema of her lower extremities in particular her ankles.  She has diminished dorsalis pedis and popliteal pulses bilaterally.  No carotid bruits bilaterally. NEUROLOGICAL:  Cranial nerves II-XII are grossly intact.  Motor and sensory intact.  LABORATORY:  CT of the T-spine without contrast shows acute or subacute compression fractures of T11-T12 degenerative changes throughout the thoracic spine and osteoporosis.  CT of the C-spine without contrast shows advanced degenerative cervical spondylosis with disk disease and facet disease, normal alignment, no acute bony findings, multilevel foraminal stenosis due to uncinate spurring and 17 mm right thyroid nodule.  Recommend ultrasound correlation and followup.  Extensive carotid artery calcifications.  CT lumbar spine without contrast shows: 1. Probable acute compression fractures of the superior end-plates of     [id number].  No protrusion of bone into the spinal canal.  No     significant disk protrusion. 2. Severe spinal stenosis L3-L4 with almost complete occlusion of the     spinal canal. 3. Severe spinal stenosis of L2-3 with almost complete occlusion of     the spinal canal, although not as severe as L3-L4.  X-ray of  the lumbar spine shows T12 superior end-plate compression fracture with possible mild progression from Feb 23, 2011, probable mild T11 superior end-plate compression fracture, multilevel spondylosis with severe facet hypertrophy.  WBC is 4.3, hemoglobin 11.6, hematocrit 35.7, platelets 353, neutrophils 60%, lymphs 24.  Sodium 137, potassium 4.3, chloride 101, CO2 26, BUN 14, creatinine 0.97.  Urinalysis is negative.  PTT is 33, INR 1.9,  PT is 22.1.  Patient is Hemoccult negative.  ASSESSMENT: 1. Coagulopathy secondary to Coumadin. 2. Tachybrady syndrome with atrial fibrillation on Coumadin. 3. Lumbar spinal stenosis and possible nerve root compression causing     loss of rectal tone and fecal incontinence. 4. Multilevel degenerative disk disease or degenerative spine disease. 5. Hypertension. 6. Diabetes mellitus type 2.  PLAN: 1. Admit. 2. Hold Coumadin. 3. Weight-based heparin protocol. 4. Fingerstick blood sugars, sliding scale insulin. 5. Monitor H and H. 6. Get list of patient's home meds and restart as appropriate once  they have been evaluated by pharmacy.  I spent 50 minutes on this admission.          ______________________________ Karie Kirks, DO     AS/MEDQ  D:  06/17/2011  T:  06/18/2011  Job:  [id number]  cc:   Jeanann Lewandowsky, M.D. Fax: [phone]  Electronically Signed by Karie Kirks DO on 06/18/2011 10:57:21 PM

## 2011-06-18 NOTE — Consult Note (Signed)
[name] [name], [name] [name] NO.:  [id number]  MEDICAL RECORD NO.:  [id number]  LOCATION:  [id number]                         FACILITY:  [facility]  PHYSICIAN:  Kary Kos, M.D.        DATE OF BIRTH:  10-09-1930  DATE OF CONSULTATION:  06/17/2011 DATE OF DISCHARGE:                                CONSULTATION   REASON FOR CONSULTATION:  Fecal incontinence, history of back pain.  HISTORY OF PRESENT ILLNESS:  The patient is a 77 year old female who noticed this morning loss of control of her bowels and also noticed some rectal bleeding.  She says that she has had episodic loose stools and swelling of her clothes for the last month or so.  She initially contacted her primary care doctor, they were not available and she has been waiting to get reappointed there, but this morning's episode of bleeding caused her to come to the emergency department.  She denies any difficulty or bladder, says she is able to empty her bladder completely and her bladder has been working fine.  She denies any pain going down her legs.  She does have some numbness in her toes, this is consistent with her history of neuropathy.  She denies any middle or upper back pain.  No neck pain.  No arm symptoms, most of her back pain, she localizes lumbosacral around the L5-S1 area.  Her past medical history is fairly remarkable.  She has history of hypertension, diabetes, history of breast cancer a year ago underwent left mastectomy, apparently did not have to undergo any chemotherapy or radiation.  Subsequent history of DVT after an ankle procedure that was 2 years ago, she has been on Coumadin ever since.  She has a pacemaker placed.  Her surgical history includes a back surgery, mastectomy, pacemaker insertion.  She is nonsmoker, nondrinker.  She has no medication allergies.  Current medication list includes Anastrozole, aspirin, Cardizem, Diovan, flecainide, furosemide, metformin, metoprolol, and  warfarin.  Her INR on admission to the emergency department was 1.9.  Her remainder of her blood work is remarkable for a hematocrit of 35.7, hemoglobin of 11.6, and white count 4.3.  her electrolytes are all within normal limits except for mild elevation of a glucose with history of diabetes at 130.  PHYSICAL EXAMINATION:  MUSCULOSKELETAL:  On exam, she has full range of motion of her neck.  Upper extremity strength is 5/5 with no loss of sensation, normal symmetric reflexes.  She has no cervical, thoracic, or upper lumbar pain, is localized with some soreness around the L5-S1 area around the SI joints.  Lower extremity strength is 5/5 in iliopsoas, quads, hamstrings, gastrocs, and EHL.  She has normal symmetric reflexes with absent ankle jerks bilaterally.  She has no saddle anesthesia. Rectal tone is decreased, although it is not flaccid.  She again does not have any numbness or loss of sensation in her perineum.  Her CT scan we had of her lumbar spine did show some mild endplate deformities at T11-T12 and moderate spinal stenosis at L3-4 predominantly little bit in L2-3, we have been consulted for the etiology of possible stenosis as the source of her fecal incontinence.  ASSESSMENT:  This  is a 77 year old female with fecal incontinence as an isolated symptom with CT scan that shows some chronic degenerative stenosis and some mild acute endplate compression fractures.  I do not think that this is pathognomonic for cauda equina syndrome in the absence of any weakness in legs.  No weakness in dorsiflexion, no change in sensation.  No saddle anesthesia.  No difficulty with her bladder.  I have recommended we scan higher up with her history of breast cancer to make sure she should not have a deformity and her spine causing spinal cord compression either thoracic or cervical spine certainly with her history of being on Coumadin.  She is at some risk of having an epidural hematoma and  periodically this could have been at fracture sites, T11- T12, and she may have some conus compression, although I do not see any evidence on the CT scan.  We cannot get an MRI scan secondary to her pacemaker.  I have recommended Medical consult for assistance in managing her medical comorbidities as well as reversal of her Coumadin. When her Coumadin gets reversed, we may need to get a myelogram to evaluate any degree of spinal cord compression or thecal sac compression and lumbar spine.  Again I do not think that necessarily her fecal incontinence or decreased rectal tone is definitely related to her spine as of yet.  We will see what the internist think and we will proceed forward accordingly.          ______________________________ Kary Kos, M.D.     GC/MEDQ  D:  06/17/2011  T:  06/18/2011  Job:  [id number]

## 2011-06-19 ENCOUNTER — Inpatient Hospital Stay (HOSPITAL_COMMUNITY): Payer: Medicare Other

## 2011-06-19 LAB — GLUCOSE, CAPILLARY
Glucose-Capillary: 126 mg/dL — ABNORMAL HIGH (ref 70–99)
Glucose-Capillary: 118 mg/dL — ABNORMAL HIGH (ref 70–99)

## 2011-06-19 LAB — URINALYSIS, ROUTINE W REFLEX MICROSCOPIC
Glucose, UA: 250 mg/dL — AB
Nitrite: NEGATIVE
Specific Gravity, Urine: 1.014 (ref 1.005–1.030)
pH: 5.5 (ref 5.0–8.0)

## 2011-06-19 LAB — URINE MICROSCOPIC-ADD ON

## 2011-06-20 ENCOUNTER — Inpatient Hospital Stay (HOSPITAL_COMMUNITY): Payer: Medicare Other

## 2011-06-20 ENCOUNTER — Inpatient Hospital Stay (HOSPITAL_COMMUNITY)
Admission: AD | Admit: 2011-06-20 | Discharge: 2011-07-01 | DRG: 490 | Disposition: A | Discharge status: Home Health | Payer: Medicare Other | Source: Other Acute Inpatient Hospital | Attending: Internal Medicine | Admitting: Internal Medicine

## 2011-06-20 LAB — CBC
HCT: 35.9 % — ABNORMAL LOW (ref 36.0–46.0)
Hemoglobin: 11.7 g/dL — ABNORMAL LOW (ref 12.0–15.0)
MCH: 27.9 pg (ref 26.0–34.0)
MCHC: 32.6 g/dL (ref 30.0–36.0)
MCV: 85.7 fL (ref 78.0–100.0)

## 2011-06-20 LAB — BASIC METABOLIC PANEL
BUN: 18 mg/dL (ref 6–23)
Calcium: 10.5 mg/dL (ref 8.4–10.5)
Creatinine, Ser: 1.02 mg/dL (ref 0.50–1.10)
GFR calc non Af Amer: 52 mL/min — ABNORMAL LOW (ref >60)
Glucose, Bld: 213 mg/dL — ABNORMAL HIGH (ref 70–99)

## 2011-06-20 LAB — GLUCOSE, CAPILLARY
Glucose-Capillary: 198 mg/dL — ABNORMAL HIGH (ref 70–99)
Glucose-Capillary: 218 mg/dL — ABNORMAL HIGH (ref 70–99)
Glucose-Capillary: 307 mg/dL — ABNORMAL HIGH (ref 70–99)

## 2011-06-20 LAB — URINE CULTURE

## 2011-06-20 LAB — PROTIME-INR: Prothrombin Time: 15.7 seconds — ABNORMAL HIGH (ref 11.6–15.2)

## 2011-06-21 ENCOUNTER — Inpatient Hospital Stay (HOSPITAL_COMMUNITY): Payer: Medicare Other

## 2011-06-21 LAB — GLUCOSE, CAPILLARY
Glucose-Capillary: 184 mg/dL — ABNORMAL HIGH (ref 70–99)
Glucose-Capillary: 203 mg/dL — ABNORMAL HIGH (ref 70–99)

## 2011-06-21 LAB — SURGICAL PCR SCREEN
MRSA, PCR: NEGATIVE
Staphylococcus aureus: NEGATIVE

## 2011-06-21 LAB — PROTIME-INR: INR: 1.17 (ref 0.00–1.49)

## 2011-06-22 LAB — GLUCOSE, CAPILLARY: Glucose-Capillary: 172 mg/dL — ABNORMAL HIGH (ref 70–99)

## 2011-06-23 LAB — GLUCOSE, CAPILLARY
Glucose-Capillary: 202 mg/dL — ABNORMAL HIGH (ref 70–99)
Glucose-Capillary: 157 mg/dL — ABNORMAL HIGH (ref 70–99)
Glucose-Capillary: 186 mg/dL — ABNORMAL HIGH (ref 70–99)
Glucose-Capillary: 159 mg/dL — ABNORMAL HIGH (ref 70–99)

## 2011-06-23 LAB — PROTIME-INR
INR: 1.21 (ref 0.00–1.49)
Prothrombin Time: 15.6 seconds — ABNORMAL HIGH (ref 11.6–15.2)

## 2011-06-24 LAB — GLUCOSE, CAPILLARY
Glucose-Capillary: 187 mg/dL — ABNORMAL HIGH (ref 70–99)
Glucose-Capillary: 124 mg/dL — ABNORMAL HIGH (ref 70–99)
Glucose-Capillary: 173 mg/dL — ABNORMAL HIGH (ref 70–99)

## 2011-06-24 LAB — PROTIME-INR
INR: 1.16 (ref 0.00–1.49)
Prothrombin Time: 15 seconds (ref 11.6–15.2)

## 2011-06-24 LAB — CBC
Hemoglobin: 10.5 g/dL — ABNORMAL LOW (ref 12.0–15.0)
Platelets: 257 10*3/uL (ref 150–400)
RBC: 3.69 MIL/uL — ABNORMAL LOW (ref 3.87–5.11)
WBC: 6.2 10*3/uL (ref 4.0–10.5)

## 2011-06-24 LAB — BASIC METABOLIC PANEL
Calcium: 9.1 mg/dL (ref 8.4–10.5)
GFR calc Af Amer: >60 mL/min (ref >60)
GFR calc non Af Amer: >60 mL/min (ref >60)
Glucose, Bld: 122 mg/dL — ABNORMAL HIGH (ref 70–99)
Potassium: 4.1 mEq/L (ref 3.5–5.1)
Sodium: 140 mEq/L (ref 135–145)

## 2011-06-24 LAB — APTT: aPTT: 25 seconds (ref 24–37)

## 2011-06-25 LAB — PROTIME-INR
INR: 1.26 (ref 0.00–1.49)
Prothrombin Time: 16.1 seconds — ABNORMAL HIGH (ref 11.6–15.2)

## 2011-06-25 LAB — APTT: aPTT: 28 seconds (ref 24–37)

## 2011-06-25 LAB — GLUCOSE, CAPILLARY

## 2011-06-26 LAB — CARDIAC PANEL(CRET KIN+CKTOT+MB+TROPI)
CK, MB: 1.8 ng/mL (ref 0.3–4.0)
Troponin I: <0.3 ng/mL (ref <0.30)

## 2011-06-26 LAB — BASIC METABOLIC PANEL
BUN: 16 mg/dL (ref 6–23)
CO2: 28 mEq/L (ref 19–32)
Chloride: 105 mEq/L (ref 96–112)
GFR calc Af Amer: >60 mL/min (ref >60)
Potassium: 4.3 mEq/L (ref 3.5–5.1)

## 2011-06-26 LAB — CBC
HCT: 32.4 % — ABNORMAL LOW (ref 36.0–46.0)
MCH: 27.9 pg (ref 26.0–34.0)
MCV: 85.9 fL (ref 78.0–100.0)
RDW: 14.7 % (ref 11.5–15.5)
WBC: 7.8 10*3/uL (ref 4.0–10.5)

## 2011-06-26 LAB — URINALYSIS, ROUTINE W REFLEX MICROSCOPIC
Bilirubin Urine: NEGATIVE
Glucose, UA: NEGATIVE mg/dL
Hgb urine dipstick: NEGATIVE
Ketones, ur: NEGATIVE mg/dL
Protein, ur: NEGATIVE mg/dL

## 2011-06-26 LAB — APTT: aPTT: 36 seconds (ref 24–37)

## 2011-06-26 LAB — GLUCOSE, CAPILLARY
Glucose-Capillary: 143 mg/dL — ABNORMAL HIGH (ref 70–99)
Glucose-Capillary: 170 mg/dL — ABNORMAL HIGH (ref 70–99)

## 2011-06-27 LAB — CBC
HCT: 29.4 % — ABNORMAL LOW (ref 36.0–46.0)
HCT: 33.2 — ABNORMAL LOW
MCH: 27.6 pg (ref 26.0–34.0)
MCHC: 32 g/dL (ref 30.0–36.0)
MCV: 86.2 fL (ref 78.0–100.0)
Platelets: 366
RDW: 14.8 % (ref 11.5–15.5)

## 2011-06-27 LAB — POCT I-STAT, CHEM 8
Calcium, Ion: 1.1 — ABNORMAL LOW
Chloride: 106
HCT: 34 — ABNORMAL LOW
TCO2: 22

## 2011-06-27 LAB — PROTIME-INR
INR: 1.7 — ABNORMAL HIGH (ref 0.00–1.49)
Prothrombin Time: 26.2 — ABNORMAL HIGH

## 2011-06-27 LAB — URINALYSIS, ROUTINE W REFLEX MICROSCOPIC
Nitrite: NEGATIVE
Specific Gravity, Urine: 1.016
Urobilinogen, UA: 1
pH: 5.5

## 2011-06-27 LAB — POCT CARDIAC MARKERS
CKMB, poc: 2
Operator id: 231701
Troponin i, poc: <0.05

## 2011-06-27 LAB — B-NATRIURETIC PEPTIDE (CONVERTED LAB): Pro B Natriuretic peptide (BNP): 201 — ABNORMAL HIGH

## 2011-06-27 LAB — GLUCOSE, CAPILLARY: Glucose-Capillary: 140 mg/dL — ABNORMAL HIGH (ref 70–99)

## 2011-06-27 LAB — DIFFERENTIAL
Eosinophils Relative: 4
Lymphs Abs: 1
Monocytes Relative: 12

## 2011-06-27 LAB — URINE MICROSCOPIC-ADD ON

## 2011-06-27 LAB — URINE CULTURE
Colony Count: NO GROWTH
Culture: NO GROWTH

## 2011-06-27 NOTE — Op Note (Signed)
NAMEREYNOLDS, GLIDDEN NO.:  [id number]  MEDICAL RECORD NO.:  [id number]  LOCATION:  [id number]                         FACILITY:  [place]  PHYSICIAN:  Kary Kos, M.D.        DATE OF BIRTH:  [birth year]  DATE OF PROCEDURE:  06/21/2011 DATE OF DISCHARGE:                              OPERATIVE REPORT   PREOPERATIVE DIAGNOSIS:  Lumbar spondylosis with stenosis of L2-3 and L3- 4.  PROCEDURE:  Decompressive lumbar laminectomy bilaterally at L2-3 and L3- 4 with bilateral foraminotomies of the 2, 3, and 4 nerve roots.  SURGEON:  Kary Kos, MD  ASSISTANT:  Cooper Render. Pool, M.D.  ANESTHESIA:  General endotracheal.  HISTORY OF PRESENT ILLNESS:  The patient is a 77 year old female who presented with episodes of fecal incontinence and urinary retention. The patient has some chronic back pain.  She has undergone previous L4-5 laminotomy many, many years ago.  The patient was admitted and underwent workup.  Her initial CT scan, subsequent CT myelography confirmed severe stenosis at L2-3 and L3-4 with some spondylosis throughout the rest of her cervical spine of mild-to-moderate degree, so the patient was stabilized and she was taken off of her Coumadin.  After workup was recommended for decompression procedure I went over the risks and benefits of the operation were explained to the patient.  She understood and agreed to proceed forward.  DESCRIPTION OF PROCEDURE:  The patient was brought to the OR, was induced under general anesthesia, and positioned prone on the Wilson frame.  Back was prepped and draped in the routine sterile fashion. Preoperative x-ray localized the L2-3 disk space, so a midline incision made starting here and going inferiorly.  After infiltration of 10 mL of lidocaine with epi, a midline incision was made.  Bovie electrocautery was used to take down the subcutaneous tissues.  Subperiosteal dissection was carried out on the lamina of L2, 3, and 4  bilaterally. Self-retaining retractor was placed.  There was marked facet arthropathy with overgrowth on her lamina and on her spinous process.  So after intraoperative x-ray identified the appropriate level, the spinous process at L3 was removed in its entirety as well as the inferior half of the spinous process at L3 and superior half of the spinous process at L4.  Central decompression was then begun and the ligament was markedly hypertrophied.  There were 2 areas of severe stenosis the worst of which was L4.  There was marked degenerative facet disease with L5 decompression of thecal sac.  At this level, ligament was markedly hypertrophied and adherent to the dura.  This was teased off with four Penfield and removed in a piecemeal fashion with a 2-mm Kerrison punch. This decompressed the central canal.  This was done bilaterally marching cephalad in a similar fashion, but less severe at the L2-3 area.  There was marked facet overgrowth in hourglass __________ level as well.  This also was under bitten with a 2-mm Kerrison punch decompressing the gutter.  Then, marched across laterally.  The foramina were inspected and all noted to be patent at L2, 3, and 4 bilaterally and after adequate central  and foraminal decompression was completed, the wound was then copiously  irrigated.  Meticulous hemostasis was maintained. Gelfoam was over laid on top of the dura and a medium Hemovac drain was placed.  The wound was closed in layers with interrupted Vicryl and the skin was closed with running 4-0 subcuticular.  Benzoin and Steri-Strips were applied.  The patient went to the recovery room in stable condition.  At the end of the case, all needle counts and sponge were correct.          ______________________________ Kary Kos, M.D.     GC/MEDQ  D:  06/21/2011  T:  06/21/2011  Job:  [id number]  Electronically Signed by Kary Kos M.D. on 06/27/2011 01:37:50 PM

## 2011-06-28 LAB — BASIC METABOLIC PANEL
BUN: 16 mg/dL (ref 6–23)
Chloride: 103 mEq/L (ref 96–112)
Glucose, Bld: 148 mg/dL — ABNORMAL HIGH (ref 70–99)
Potassium: 4.2 mEq/L (ref 3.5–5.1)

## 2011-06-28 LAB — GLUCOSE, CAPILLARY: Glucose-Capillary: 95 mg/dL (ref 70–99)

## 2011-06-29 LAB — GLUCOSE, CAPILLARY: Glucose-Capillary: 109 mg/dL — ABNORMAL HIGH (ref 70–99)

## 2011-06-29 LAB — PROTIME-INR: Prothrombin Time: 24.4 seconds — ABNORMAL HIGH (ref 11.6–15.2)

## 2011-06-30 LAB — APTT: aPTT: 54 seconds — ABNORMAL HIGH (ref 24–37)

## 2011-06-30 LAB — GLUCOSE, CAPILLARY
Glucose-Capillary: 113 mg/dL — ABNORMAL HIGH (ref 70–99)
Glucose-Capillary: 92 mg/dL (ref 70–99)
Glucose-Capillary: 131 mg/dL — ABNORMAL HIGH (ref 70–99)

## 2011-06-30 LAB — BASIC METABOLIC PANEL
BUN: 11 mg/dL (ref 6–23)
CO2: 29 mEq/L (ref 19–32)
GFR calc non Af Amer: 59 mL/min — ABNORMAL LOW (ref >60)
Glucose, Bld: 112 mg/dL — ABNORMAL HIGH (ref 70–99)
Potassium: 3.7 mEq/L (ref 3.5–5.1)
Sodium: 138 mEq/L (ref 135–145)

## 2011-06-30 LAB — PROTIME-INR: INR: 2.69 — ABNORMAL HIGH (ref 0.00–1.49)

## 2011-06-30 NOTE — Discharge Summary (Signed)
[name] [name], SEWER NO.:  [id number]  MEDICAL RECORD NO.:  [id number]  LOCATION:  [id number]                         FACILITY:  [facility]  PHYSICIAN:  Sherryl Manges, M.D.  DATE OF BIRTH:  January 25, 1931  DATE OF ADMISSION:  06/20/2011 DATE OF DISCHARGE:  06/28/2011                        DISCHARGE SUMMARY - REFERRING   PRIMARY MD:  Dr. Jeanann Lewandowsky.  DISCHARGE DIAGNOSES: 1. Multifactorial L2-L3/L3-L4 stenosis, status post decompressive     surgery on June 21, 2011. 2. Urinary retention, likely secondary to L2-L3/L3-L4 stenosis. 3. Bilateral lower extremity great toe wounds. 4. History of T11-T12 compression fractures. 5. History of tachy-brady syndrome, status post PPM, on chronic     anticoagulation. 6. Hypertension. 7. Type 2 diabetes mellitus. 8. Constipation. 9. History of breast cancer.  DISCHARGE MEDICATIONS: 1. Acetaminophen 650 mg p.o. p.r.n. every 4 hourly for pain. 2. Ciprofloxacin 500 mg p.o. b.i.d. to be completed on July 10, 2011. 3. Flexeril 10 mg p.o. p.r.n. every 8 hourly for muscle spasms. 4. Doxycycline 100 mg p.o. b.i.d. to be completed on July 10, 2011. 5. Hydrocodone/APAP (5/325) 1 to 2 tablets p.o. p.r.n. every 4 hourly     for pain. 6. MiraLax 17 g p.o. b.i.d. 7. Coumadin per INR, currently on 5 mg p.o. daily at 6 p.m. (target     INR 2-3). 8. Anastrozole 1 mg p.o. daily. 9. Aspirin 81 mg p.o. q.a.m. 10.Flecainide 100 mg p.o. daily. 11.Multivitamin 1 tablet p.o. daily. 12.Metformin 750 mg p.o. b.i.d. 13.Vitamin B12 500 mg p.o. daily. 14.Vitamin B6 200 mg p.o. daily.  Note:  Diovan, furosemide, metoprolol tartrate and Cardizem CD all have been discontinued.  PROCEDURES: 1. X-ray lumbar of spine June 17, 2011.  This showed T12 superior     endplate compression fracture from Feb 23, 2011, probable mild T11     superior endplate compression fracture stable, multilevel     spondylosis with severe facet  hypertrophy. 2. CT scan lumbar spine June 17, 2011.  This showed probable     acute compression fractures of the superior endplate of [id number]     that pushed bone into the spinal canal, significant disk     protrusion, severe spinal stenosis at L3-L4 with almost complete     occlusion of the spinal canal, severe spinal stenosis at L2-L3 with     almost complete occlusion of the spinal canal although not as     severe as L3-L4. 3. CT C-spine June 17, 2011.  This showed advanced degenerative     cervical spondylosis with disk disease and facet disease, normal     alignment and no acute bony findings.  There was multilevel     foraminal stenosis due to uncinate spurring and facet disease.     This is most notable at C4-C5, C5-C6 and C6-C7. 4. CT scan of C-spine June 17, 2011.  This showed acute to     subacute compression fractures of T11 and T12 degenerative changes     throughout the thoracic spine, osteoporosis. 5. X-ray of the left foot June 18, 2011.  This showed soft tissue     swelling of the distal aspect of the great toe.  The  patient had     proximal phalanx of the great toe which could indicate     osteomyelitis.  No destructive changes evident. 6. X-ray right foot on June 18, 2011.  This showed grade 2 soft     tissue swelling with no radiographic evidence of osteomyelitis. 7. CT scan T-spine June 20, 2011.  This showed stable T11-T12     superior endplate compression fractures of the anterior wedging.     No retropulsion of bone.  No associated spinal stenosis.  No     thoracic spinal stenosis despite multilevel small disk protrusions,     mild left T10 foraminal stenosis, multilevel degenerative lumbar     spinal stenosis ranging from borderline to severe.  Incidental     findings include pulmonary atelectasis, moderate hiatal hernia and     extensive atherosclerotic aorta with mild infrarenal abdominal     aortic aneurysm. 8. CT scan C-spine  June 20, 2011, with myelogram.  This showed     mild multifactorial degenerative cervical spine stenosis C3-C4     through for C5-C8 and subtle mass effect on spinal cord across,     multifactorial, moderate-to-severe neural foraminal stenosis at     left C4, bilateral C5, right C6 and left C7 nerve levels. 9. Chest x-ray June 21, 2011.  This showed stable mild     cardiomegaly and hiatal hernia.  No any worsening or acute     abnormality noted. 10.Lumbar spine x-ray June 21, 2011.  This showed lateral     intraoperative radiographs of the lumbar spine as above.  CONSULTATIONS:  Dr. Kary Kos, neurosurgeon.  ADMISSION HISTORY:  As in H and P notes of June 17, 2011, dictated by Dr. Karie Kirks.  However, in brief, this is a 77 year old female, with known history of paroxysmal atrial fibrillation/tachy-brady syndrome, status post permanent pacemaker, on chronic anticoagulation, hypertension, type 2 diabetes mellitus, previous history of breast cancer, degenerative disk disease, status post previous back surgery, status post left ankle surgery, status post total abdominal hysterectomy and left mastectomy, presenting with complaints of passing some blood per rectum on the day of presentation.  On initial evaluation in the emergency department, she was found to be heme-negative, but she was also found to have no rectal tone on examination.  On detailed questioning, it appears that patient had also experienced constipation and apparently lately, had bowel movements without associated sensation.  On initial evaluation with imaging studies, she was found to have T11/T12-compression fractures, multilevel degenerative changes as well as multifactorial severe lumbar spinal stenosis.  She was then admitted for further evaluation, investigation and management.  CLINICAL COURSE: 1. Lumbar spinal stenosis.  The patient presented as described above.     Imaging studies showed  severe L2-L3 and L3-L4 lumbar spinal     stenosis, although she had multi-level DJD.  Neurosurgical     consultation was kindly provided by Dr. Kary Kos and the patient     proceeded to decompressive surgery on June 21, 2011, in an     uncomplicated procedure.  As of June 28, 2011, she was     ambulating with PT/OT.  Constipation has resolved, particularly     after utilization of laxatives, but patient still has episodic     urinary retention for which she has required Foley catheterization.     Attempts at voiding trial have proved unsuccessful.  The patient will     be discharged on an indwelling Foley catheter with arrangements  for     Urology follow-up, per my discussion with Dr. Saintclair Halsted on June 27, 2011.  2. T11/T12 compression fracture.  Patient's initial imaging studies     confirmed acute/subacute T11 and T12 compression fractures.  The     patient, however, was not troubled by acute back pain.  Imaging     studies confirmed that these fractures were stable with no spinal     cord compromise and certainly this has not militated against effective     ambulation of the patient.  3. Tachy-brady syndrome.  The patient has a history of paroxysmal     atrial fibrillation and tachy-brady syndrome.  She was stable     during the course of this hospitalization on preadmission,     cardiotropic medications.  However, in the last couple of days,     patient has had persistently low blood pressures. Eventually, Cardizem     CD and metoprolol were discontinued without any deleterious effect     and the patient's blood pressures were within normal limits as of     June 28, 2011.  As of that date, heart rate was satisfactory     at 62 and BP was 118/56.  Patient had no symptoms of postural     dizziness.  She continues on flecainide, however.  4. History of hypertension.  The patient was normotensive throughout     most of the course of hospitalization with the caveat  described     above.  Once again as of June 28, 2011, her BP was normal.  5. Type 2 diabetes mellitus.  This was managed with appropriate diet,     sliding-scale insulin coverage and oral hypoglycemics.  Patient     remained euglycemic.  6. Bilateral great toe wounds.  The patient was found to have     bilateral great toe wounds.  She has been placed on a 3-week course     antibiotics, to be completed on July 10, 2011.  During the course of     her hospitalization, she was managed with local care comprising     Betadine paint and sterile gauze dressings daily.  These wounds     will require careful observation during the course of her     sojorn at skilled nursing facility. Patient according to her,      has been evaluated by Dr Adele Barthel, vascular surgeon approximately one month ago,     and no significant peripheral vascular disease, found.  DISPOSITION:  The patient as of June 28, 2011, considered clinically stable for discharge.  DIET:  Heart-healthy/carbohydrate modified.  ACTIVITY:  Recommended to increase activity slowly, otherwise as tolerated and per PT/OT/rehab.  FOLLOWUP INSTRUCTIONS:  The patient will follow up with her primary MD, Dr. Jeanann Lewandowsky, routinely per scheduled appointment.  She will follow up with Dr. Kary Kos, neurosurgeon, on a date to be determined but within 2 weeks of discharge.  SPECIAL INSTRUCTIONS:  The patient will need to follow up with Dr Gaynelle Arabian, urologist following discharge and appointment will be scheduled with [facility].     Sherryl Manges, M.D.     CO/MEDQ  D:  06/28/2011  T:  06/28/2011  Job:  [id number]  cc:   Jeanann Lewandowsky, M.D. Kary Kos, M.D.  Electronically Signed by Sherryl Manges M.D. on 06/30/2011 07:00:49 PM

## 2011-07-01 LAB — PROTIME-INR: INR: 3.19 — ABNORMAL HIGH (ref 0.00–1.49)

## 2011-07-01 LAB — GLUCOSE, CAPILLARY: Glucose-Capillary: 121 mg/dL — ABNORMAL HIGH (ref 70–99)

## 2011-07-01 NOTE — Discharge Summary (Signed)
[name] [name], [name] [name] NO.:  [id number]  MEDICAL RECORD NO.:  [id number]  LOCATION:  [id number]                         FACILITY:  [facility]  PHYSICIAN:  Estill Cotta, MD       DATE OF BIRTH:  09/22/1931  DATE OF ADMISSION:  06/17/2011 DATE OF DISCHARGE:                        DISCHARGE SUMMARY - REFERRING   PRIMARY CARE PHYSICIAN:  Dr. Gari Crown  CURRENT DIAGNOSES: 1. Fecal incontinence with back pain, cord compression ruled out. 2. Multifactorial L2-3, L3-4 spinal stenosis. 3. Bright rectal bleeding, FOBT negative. 4. Urinary retention, resolved. 5. Constipation. 6. Right toe ulcer, Wound Care following. 7. Stable T11 and T12 compression fracture. 8. History of tachybrady syndrome, on anticoagulation. 9. Hypertension. 10.Diabetes mellitus 2. 11.History of breast cancer. 12.History of degenerative disk disease.  CONSULTATION:  Neurosurgery, Kary Kos, MD  BRIEF HISTORY OF PRESENT ILLNESS:  At the time of admission, [name] [name] is a 77 year old female who initially presented to the emergency room as she saw some blood with some stool in her underwear on the morning of admission.  On exam, the patient was found to be heme-negative from below, but she also was found to have no rectal tone on exam.  The patient stated that on the day of admission was the first time she had a bowel movement without ever having any sensation of having a bowel movement.  She was found to have a T11 and T12 compression fracture and degenerative changes throughout the thoracic spine as well as osteoporosis and severe lumbar spinal stenosis.  Neurosurgery was consulted and the patient was admitted for further workup.  RADIOLOGICAL DATA:  Lumbar spine x-ray showed T12 superior endplate compression fracture with possible mild progression from Feb 23, 2011; probable mild T11 superior endplate compression fracture, stable. Multilevel spondylosis with severe Facet hypertrophy.  The  patient should be tested for osteoporosis using the DEXA scan.  CT C-spine and L- spine showed probable acute compression fracture of the superior endplates of 624THL and 624THL, no protrusion of the bone into the spinal canal, no significant disc protrusion, severe spinal stenosis at L3-L4 with almost complete occlusion of the spinal canal, severe spinal stenosis at L2-3 with almost complete occlusion of the spinal canal. Left foot x-ray showed soft tissue swelling of the distal aspect of the great toe, periosteal elevation of the proximal phalanx of the great toe which could be chronic or could indicate osteomyelitis, no destructive changes.  Right foot x-ray, great toe soft tissue swelling without radiographic evidence of osteomyelitis.  Also had CT C-spine on September 17th showed advanced degenerative cervical spondylosis with disk disease and facet disease.  Normal alignment and no acute bony findings.  Multilevel foraminal stenosis due to uncinate spurring and facet disease most notable at C4-5, C5-6, C6-7; 17 mm right thyroid nodule.  CT myelogram was done today on September 20th which showed mild multifactorial degenerative cervical spine stenosis C3-C4 through C5-C6 only subtle mass effect on the spinal cord occurs multifactorial moderate-to-severe neural foraminal stenosis at the left C4, bilateral C5, right C6, and left C7 nerve levels. Stable T11 and T12 superior endplate compression fractures with anterior wedging.  No retropulsion of bone.  No associated spinal stenosis.  No  thoracic spinal stenosis despite multilevel small disk protrusion.  Mild left T10 foraminal stenosis.  Multifactorial degenerative upper lumbar spinal stenosis ranges from borderline to severe.  PERTINENT LABORATORY AND DIAGNOSTIC DATA:  Urine culture negative.  BRIEF HOSPITALIZATION COURSE:  So far, [name] [name] is a 77 year old female who was admitted with fecal incontinence with a history of back pain  and spinal stenosis. 1. Fecal incontinence with history of back pain and spinal stenosis.     The patient did not have any weakness in the legs or any saddle     anesthesia or difficulty with her bladder.  Neurosurgery was     consulted and the patient was followed closely by Dr. Saintclair Halsted from     Neurosurgery.  CT myelogram was done today as MRI could not be done     due to the pacemaker.  CT myelogram did not show any cord     compression, however, she has severe lumbar spinal stenosis at L2-3     and L3-4 level.  She was recommended a decompression laminectomy at     L2-3, L3-4 level by Neurosurgery which would be done at [facility] tomorrow. 2. History of tachybrady syndrome with atrial fibrillation.  The     patient's heart rate has been well controlled and she was continued     on heparin drip for the CT myelogram.  Heparin drip will be stopped     4 hours prior to the procedure and after the procedure, Coumadin     can be resumed. 3. Diabetes mellitus.  The patient was started on Decadron IV for the     suspicion of cord compression, hence the blood sugars have been     elevated, the sliding scale has been increased to moderate scale. 4. Right toe ulcer.  Wound care consultation was also obtained.  X-     rays of the right and left toes were obtained.  The patient has     some mild superficial infection in this right great toe, however,     no radiographic evidence of osteomyelitis.  Wound care consult     should follow the patient at [facility].  Also she is     currently receiving doxycycline and ciprofloxacin.  DISPOSITION:  The patient will be transferred to [facility] under team 7, [facility] hospitalist.  Dr. Saintclair Halsted to follow the patient at [facility].  Discharge time 45 minutes.     Estill Cotta, MD     RR/MEDQ  D:  06/20/2011  T:  06/20/2011  Job:  [id number]  cc:   Dr. Shanon Brow, M.D. Fax:  [phone]  Electronically Signed by Kimie Pidcock  on 07/01/2011 03:10:14 PM

## 2011-07-02 ENCOUNTER — Ambulatory Visit (INDEPENDENT_AMBULATORY_CARE_PROVIDER_SITE_OTHER): Payer: Medicare Other | Admitting: Physician Assistant

## 2011-07-02 ENCOUNTER — Encounter: Payer: Self-pay | Admitting: Physician Assistant

## 2011-07-02 ENCOUNTER — Ambulatory Visit (INDEPENDENT_AMBULATORY_CARE_PROVIDER_SITE_OTHER): Payer: Medicare Other | Admitting: *Deleted

## 2011-07-02 VITALS — BP 118/60 | HR 85 | Ht 72.0 in | Wt 227.4 lb

## 2011-07-02 DIAGNOSIS — I4892 Unspecified atrial flutter: Secondary | ICD-10-CM

## 2011-07-02 DIAGNOSIS — Z8679 Personal history of other diseases of the circulatory system: Secondary | ICD-10-CM

## 2011-07-02 DIAGNOSIS — Z86718 Personal history of other venous thrombosis and embolism: Secondary | ICD-10-CM

## 2011-07-02 DIAGNOSIS — I5032 Chronic diastolic (congestive) heart failure: Secondary | ICD-10-CM

## 2011-07-02 DIAGNOSIS — I2699 Other pulmonary embolism without acute cor pulmonale: Secondary | ICD-10-CM

## 2011-07-02 DIAGNOSIS — I10 Essential (primary) hypertension: Secondary | ICD-10-CM

## 2011-07-02 DIAGNOSIS — I6529 Occlusion and stenosis of unspecified carotid artery: Secondary | ICD-10-CM

## 2011-07-02 DIAGNOSIS — I4891 Unspecified atrial fibrillation: Secondary | ICD-10-CM

## 2011-07-02 DIAGNOSIS — I82409 Acute embolism and thrombosis of unspecified deep veins of unspecified lower extremity: Secondary | ICD-10-CM

## 2011-07-02 DIAGNOSIS — I509 Heart failure, unspecified: Secondary | ICD-10-CM

## 2011-07-02 LAB — CULTURE, BLOOD (ROUTINE X 2)
Culture  Setup Time: 201209262242
Culture: NO GROWTH

## 2011-07-02 LAB — POCT INR: INR: 3.7

## 2011-07-02 NOTE — Patient Instructions (Addendum)
Keep taking Flecainide twice daily. You can stop aspirin. Stay on coumadin and follow up with the coumadin clinic. Weigh daily and call if:  Weight up 3 lbs in one day, increased swelling or increased shortness of breath. Check BP daily.  If you notice your pressure is consistently 140/90 or higher, call us.  Your physician recommends that you schedule a follow-up appointment in: 4-6 weeks with Dr. Ron Parker per Richardson Dopp, PA-C

## 2011-07-02 NOTE — Assessment & Plan Note (Addendum)
Maintaining NSR.  Continue coumadin and flecainide.  BP will not yet tolerate restarting metoprolol or diltiazem.  Monitor for now.  If BP goes up, she will call. I would restart diltiazem first.  Follow up with Dr. Ron Parker in 4-6 weeks.  She can stop ASA.

## 2011-07-02 NOTE — Assessment & Plan Note (Signed)
Controlled.  She is now off all meds.  She will monitor pressures.  If BP goes up, restart diltiazem first.  Consider ARB next with h/o diabetes.

## 2011-07-02 NOTE — Assessment & Plan Note (Signed)
Volume stable.  Lasix on hold.  Watch weights and call if weight gain or increased dyspnea.

## 2011-07-02 NOTE — Progress Notes (Signed)
History of Present Illness: Primary Cardiologist:  Dr. Cleatis Polka  Primary Electrophysiologist:  Dr. Olegario Messier is a 77 y.o. female who presents for post hospital follow up.    She has a history of atrial fibrillation/flutter, controlled on flecainide, tachybradycardia syndrome, status post pacemaker implantation [id number], mild diastolic dysfunction, pulmonary hypertension, hypertension, DVT with pulmonary embolism in the past, diabetes, goiter, breast cancer and carotid stenosis..  Last echocardiogram 10/10: Mild LVH, EF 55-60%, mild, mild MR, mild to moderate LAE, mild RVE, severe RAE, moderate TR, PASP 53.  Last Myoview 11/10: No ischemia, EF 65%.  Last carotid Dopplers [id number]: RICA [id number], LICA [id number].    She was admitted to [facility] 9/20-10/1.  She presented with bright red blood per rectum.  She was quickly noted to have decreased rectal tone and evaluation demonstrated severe lumbar spinal stenosis.  She underwent lumbar laminectomy by Dr. Saintclair Halsted 9/22.  Her postoperative course was fairly uneventful from a cardiac perspective.  She did develop significant hypotension which prompted the discontinuation of diltiazem and metoprolol.  She did have wounds that developed on her toes which was treated by the wound care nursing staff.  She also had some urinary retention and required Foley placement.  Her INR was slightly elevated at discharge and this was held until she could follow up in our Coumadin clinic.  She returns today for review of her medications.  She was just discharged yesterday.  She has not taken any of her medicines yet.  She feels ok.  Has some back pain.  Notes ulcers on toes followed by her podiatrist (Dr. Elvina Mattes) in [place].  Denies dyspnea or chest pain.  No palpitations.  No orthopnea, PND or edema.  Unfortunately, her sister is currently missing.   There have been "Silver Alerts" on the news and she is very worried about her.  Her daughter is checking  on her and can check her BP.  She has [place] coming out starting tomorrow as well.  Past Medical History  Diagnosis Date  . Hypertension   . Diabetes mellitus   . Tachy-brady syndrome     s/p pacer 07/2009  . Atrial fibrillation     flecainide; coumadin  . Atrial flutter   . Chronic diastolic heart failure     echo 10/10: mild LVH, EF 55-60%, mild AI, mild MR, mild to mod LAE, mild RVE, severe RAE, mod TR, PASP 53  . Shortness of breath     myoview 11/10: EF 65%, no ischemia  . Pulmonary HTN   . History of DVT (deep vein thrombosis)   . History of pulmonary embolism 2006  . Carotid stenosis     [id number]: RICA [id number], LICA [id number]  . Thyroid nodule     non-neoplastic goiter  . Breast cancer     Current Outpatient Prescriptions  Medication Sig Dispense Refill  . acetaminophen (TYLENOL) 650 MG CR tablet Take 650 mg by mouth every 8 (eight) hours as needed.        Marland Kitchen anastrozole (ARIMIDEX) 1 MG tablet Take 1 mg by mouth daily.        . ciprofloxacin (CIPRO) 500 MG tablet Take 500 mg by mouth 2 (two) times daily.       . cyclobenzaprine (FLEXERIL) 10 MG tablet Take 10 mg by mouth as needed.       . doxycycline (VIBRA-TABS) 100 MG tablet Take 100 mg by mouth 2 (two) times daily.       Marland Kitchen  flecainide (TAMBOCOR) 100 MG tablet Take 100 mg by mouth 2 (two) times daily.        Marland Kitchen HYDROcodone-acetaminophen (NORCO) 5-325 MG per tablet Take 1 tablet by mouth every 4 (four) hours as needed.       . metFORMIN (GLUMETZA) 500 MG (MOD) 24 hr tablet Take 500 mg by mouth. One and half tablet twice daily      . polyethylene glycol (MIRALAX / GLYCOLAX) packet Take 17 g by mouth 2 (two) times daily.        Marland Kitchen pyridoxine (B-6) 100 MG tablet Take 200 mg by mouth daily.        . vitamin B-12 (CYANOCOBALAMIN) 500 MCG tablet Take 500 mcg by mouth daily.        Marland Kitchen warfarin (COUMADIN) 5 MG tablet Take by mouth as directed.          Allergies: Allergies  Allergen Reactions  . Iohexol     Vital Signs: BP 118/60   Pulse 85  Ht 6' (1.829 m)  Wt 227 lb 6.4 oz (103.148 kg)  BMI 30.84 kg/m2  PHYSICAL EXAM: Well nourished, well developed, in no acute distress, sitting in a wheelchair HEENT: normal Neck: no JVD at 90 degrees Cardiac:  normal S1, S2; RRR; no murmur Lungs:  clear to auscultation bilaterally, no wheezing, rhonchi or rales Abd: soft, nontender Ext: trace bilateral edema Skin: warm and dry Neuro:  CNs 2-12 intact, no focal abnormalities noted Psych: tearful at times  EKG:  NSR, HR 85, normal axis, no ischemic changes  ASSESSMENT AND PLAN:

## 2011-07-02 NOTE — Assessment & Plan Note (Signed)
Due for dopplers in 08/2011.

## 2011-07-03 ENCOUNTER — Telehealth: Payer: Self-pay | Admitting: *Deleted

## 2011-07-03 DIAGNOSIS — R0602 Shortness of breath: Secondary | ICD-10-CM

## 2011-07-03 MED ORDER — POTASSIUM CHLORIDE CRYS ER 20 MEQ PO TBCR: 20.0000 meq | EXTENDED_RELEASE_TABLET | Freq: Every day | ORAL | Status: DC

## 2011-07-03 MED ORDER — FUROSEMIDE 40 MG PO TABS: 40.0000 mg | ORAL_TABLET | Freq: Every day | ORAL | Status: DC

## 2011-07-03 NOTE — Discharge Summary (Signed)
NAMENAJELY, GILLINGS NO.:  [id number]  MEDICAL RECORD NO.:  [id number]  LOCATION:  [id number]                         FACILITY:  [facility]  PHYSICIAN:  Sherryl Manges, M.D.  DATE OF BIRTH:  1931-01-26  DATE OF ADMISSION:  06/20/2011 DATE OF DISCHARGE:                              DISCHARGE SUMMARY   ADDENDUM   Details of discharge diagnoses, procedures, consultations, and clinical course are clearly described in discharge summary dictated June 28, 2011.  Unfortunately, the patient could not be discharged to SNF or rehab as planned, because of insurance issues. For the period from June 29, 2011, through July 01, 2011, the patient remained clinically stable without any further issues.  Dr. Gaynelle Arabian, urologist was kind enough to see the patient on June 30, 2011, and he discontinued the patient's Foley catheter and recommended that she proceed with intermittent self-catheterization three times a day.  The patient has been instructed on how to perform this and was able to cope with it. Although he did recommend antibiotic coverage for possible urinary tract infection, as the patient is already on ciprofloxacin, no further antibiotic changes were made to her treatment.  The patient made steady progress with PT/OT.  INR was 3.19 on July 01, 2011, therefore Coumadin has been placed on hold until INR is rechecked on July 03, 2011, at the [facility].  The patient was discharged on July 01, 2011.  Discharge medications, discharge instructions and recommendations on wound care, remain the same.  The following however are pertinent: 1. The patient will follow up with her primary MD, Dr. Jeanann Lewandowsky     within 1-2 weeks of discharge.  She has been instructed to call for     an appointment. 2. The patient will follow up with Dr. Kary Kos, neurosurgeon,     telephone number [phone].  She has been instructed to call for     an  appointment and has verbalized understanding. 3. The patient will follow up with Dr. Gaynelle Arabian, urologist.  An     appointment has been scheduled for July 16, 2011, at 2:45 p.m.     Telephone number [phone]. 4. The patient will follow up at the [facility] in the     a.m. of July 03, 2011, for PT/INR check. Thereafter, the Coumadin     Clinic will be responsible for timing of reinstatement of the     patient's Coumadin and dosage, as well as timing of subsequent     PT/INR checks.  SPECIAL INSTRUCTIONS: 1. Home health PT, OT, and RN for wound care. 2. The patient has been recommended not to take Coumadin until PT/INR     check is done at the [facility] on July 03, 2011.      Note:  It appears that the patient had been seen by Dr. Adele Barthel,     vascular surgeon on March 27, 2011, and that the patient had     undergone workup for peripheral vascular disease and not found to     have significant peripheral vascular disease. Therefore, no followup     appointment has been arranged.     Sherryl Manges, M.D.  CO/MEDQ  D:  07/01/2011  T:  07/01/2011  Job:  [id number]  cc:   Jeanann Lewandowsky, M.D.  Electronically Signed by Sherryl Manges M.D. on 07/03/2011 09:13:47 AM

## 2011-07-03 NOTE — Telephone Encounter (Signed)
S/w pt and her Loop who is aware of lab results and med change to put pt back on lasix at a new dose of 40 mg daily ans to start K+ 20 meq daily. Sonia Baller also aware if pt is not feeling better in the AM and still feels sob, and has edema she is to go to the ED, if not going to the ED needs to call pt our office and let us know what her weight is and how she is feeling. Sonia Baller, RN will get repeat bmet 07/10/11 and will fax results to [employer] [employer]. Rx sent in today for lasix 40 mg daily and K+ 20 meq daily. Julaine Hua

## 2011-07-03 NOTE — Telephone Encounter (Signed)
[place], [place]

## 2011-07-05 ENCOUNTER — Encounter: Payer: Medicare Other | Admitting: *Deleted

## 2011-07-05 NOTE — Consult Note (Signed)
NAMEMAXCINE, SELDIN NO.:  [id number]  MEDICAL RECORD NO.:  [id number]  LOCATION:  43                         FACILITY:  [facility]  PHYSICIAN:  Lena Fieldhouse I. Gaynelle Arabian, M.D.DATE OF BIRTH:  [birth year]  DATE OF CONSULTATION:  06/30/2011 DATE OF DISCHARGE:                                CONSULTATION   SUBJECTIVE:  Ms. Vissers is a 77 year old type 2 diabetic, admitted from [facility] emergency room on September 17 because of coagulopathy secondary to Coumadin, with rectal bleed.  The patient was found to have __________ a diagnosis of L2-3 and L3-4 severe spinal stenosis.  She underwent compression on June 21, 2011.  Postoperatively, the patient had suffered urinary retention, and Urology is consulted for continuous urinary retention.  PAST MEDICAL HISTORY:  Significant for; 1. Tachybrady syndrome. 2. Pacemaker insertion. 3. Chronic anticoagulation. 4. Hypertension. 5. Left breast cancer treated with lumpectomy only. 6. Degenerative disk disease. 7. History of fraction of left ankle (3-4 years ago). 8. Diabetic neuropathy with toe and foot ulcer disease.  ALLERGIES:  None.  MEDICATIONS: 1. Diltiazem 240 mg a day. 2. Vitamin B12. 3. Paroxetine. 4. Metoprolol 100 mg b.i.d. 5. Tambocor 400 mg p.o. per day. 6. Benicar 20 mg p.o. per day. 7. Aspirin 81 mg per day. 8. Arimidex 1 mg per day. 9. Doxycycline 100 mg b.i.d. 10.Cipro (off). 11.MiraLax p.r.n. 12.Dulcolax suppository. 13.Zolpidem. 14.Tylenol. 15.Hydrocodone.  FAMILY HISTORY:  The patient is the sole caregiver for her 35 year old brother with senile dementia __________ .  The patient's sister is currently missing and sought by police, this has caused a great amount of social and personal conflict with the patient.  The patient is widowed (husband had MI).  REVIEW OF SYSTEMS:  Significant for: 1. Diabetic foot ulcers, treated by Dr. Elvina Mattes at [facility]. 2. GU review of systems:   Normal urine voids preop, but the patient     does admit to nocturia x3-4, with daytime voiding x1-2 only, and     occasional UTI and occasional urinary incontinence. 3. GI:  No history of constipation.  PHYSICAL EXAMINATION:  GENERAL:  A well-developed, well-nourished female in no acute distress. VITAL SIGNS:  Temperature 98.1, pulse 83, respiratory 18, blood pressure 93/45, O2 sat 91%. ABDOMEN:  Soft, __________ bowel sounds.  No organomegaly without masses. GENITOURINARY:  Normal BUS. EXTREMITIES:  No cyanosis or edema.  IMPRESSION:  (40 minutes).  The patient most probably has diabetic peripheral neuropathy, is evidenced by toe ulcers.  She is in denial with regard to the possibility of neuropathy, but note that the patient voids only a 1-2 times per day, has multiple episodes of nocturia, occasional incontinence.  She also has no rectal sphincter tone, which could come from her spinal stenosis, but may also be affected by her diabetes.  The patient needs video urodynamics, but cannot have this for 3 months per Medicare regulations.  She has had multiple catheterizations in the hospital, with 300-600 mL residuals.  I would recommend coverage with trimethoprim 100 mg one per day, and intermittent cath per nursing staff 3 times a day.  This may change her requirement for rehabilitation to skilled nursing facility.  She should return to my office in  followup,  [phone]. Pierre Bali I. Gaynelle Arabian, M.D.     SIT/MEDQ  D:  06/30/2011  T:  06/30/2011  Job:  [id number]  cc:   Dr. Verdis Prime, M.D.  Electronically Signed by Carolan Clines M.D. on 07/05/2011 12:53:53 PM

## 2011-07-06 ENCOUNTER — Emergency Department (HOSPITAL_COMMUNITY): Payer: Medicare Other

## 2011-07-06 ENCOUNTER — Emergency Department (HOSPITAL_COMMUNITY)
Admission: EM | Admit: 2011-07-06 | Discharge: 2011-07-06 | Disposition: A | Discharge status: Home / Self Care | Payer: Medicare Other | Attending: Emergency Medicine | Admitting: Emergency Medicine

## 2011-07-06 DIAGNOSIS — Z950 Presence of cardiac pacemaker: Secondary | ICD-10-CM | POA: Insufficient documentation

## 2011-07-06 DIAGNOSIS — E119 Type 2 diabetes mellitus without complications: Secondary | ICD-10-CM | POA: Insufficient documentation

## 2011-07-06 DIAGNOSIS — I509 Heart failure, unspecified: Secondary | ICD-10-CM | POA: Insufficient documentation

## 2011-07-06 DIAGNOSIS — Z853 Personal history of malignant neoplasm of breast: Secondary | ICD-10-CM | POA: Insufficient documentation

## 2011-07-06 DIAGNOSIS — R0602 Shortness of breath: Secondary | ICD-10-CM | POA: Insufficient documentation

## 2011-07-06 DIAGNOSIS — R609 Edema, unspecified: Secondary | ICD-10-CM | POA: Insufficient documentation

## 2011-07-06 DIAGNOSIS — I10 Essential (primary) hypertension: Secondary | ICD-10-CM | POA: Insufficient documentation

## 2011-07-06 DIAGNOSIS — Z7901 Long term (current) use of anticoagulants: Secondary | ICD-10-CM | POA: Insufficient documentation

## 2011-07-06 DIAGNOSIS — Z9889 Other specified postprocedural states: Secondary | ICD-10-CM | POA: Insufficient documentation

## 2011-07-06 DIAGNOSIS — Z86718 Personal history of other venous thrombosis and embolism: Secondary | ICD-10-CM | POA: Insufficient documentation

## 2011-07-06 LAB — PROTIME-INR
INR: 2.21 — ABNORMAL HIGH (ref 0.00–1.49)
Prothrombin Time: 24.9 seconds — ABNORMAL HIGH (ref 11.6–15.2)

## 2011-07-06 LAB — CBC
HCT: 30.4 % — ABNORMAL LOW (ref 36.0–46.0)
Hemoglobin: 9.9 g/dL — ABNORMAL LOW (ref 12.0–15.0)
MCH: 28 pg (ref 26.0–34.0)
MCV: 86.1 fL (ref 78.0–100.0)
Platelets: 350 10*3/uL (ref 150–400)
RBC: 3.53 MIL/uL — ABNORMAL LOW (ref 3.87–5.11)
WBC: 4.1 10*3/uL (ref 4.0–10.5)

## 2011-07-06 LAB — COMPREHENSIVE METABOLIC PANEL
Albumin: 3.5 g/dL (ref 3.5–5.2)
BUN: 11 mg/dL (ref 6–23)
Chloride: 101 mEq/L (ref 96–112)
Creatinine, Ser: 0.85 mg/dL (ref 0.50–1.10)
GFR calc Af Amer: 74 mL/min — ABNORMAL LOW (ref >90)
Glucose, Bld: 69 mg/dL — ABNORMAL LOW (ref 70–99)
Total Bilirubin: 0.5 mg/dL (ref 0.3–1.2)
Total Protein: 7.6 g/dL (ref 6.0–8.3)

## 2011-07-06 LAB — DIFFERENTIAL
Eosinophils Absolute: 0.1 10*3/uL (ref 0.0–0.7)
Lymphocytes Relative: 13 % (ref 12–46)
Lymphs Abs: 0.5 10*3/uL — ABNORMAL LOW (ref 0.7–4.0)
Monocytes Relative: 10 % (ref 3–12)
Neutro Abs: 3 10*3/uL (ref 1.7–7.7)
Neutrophils Relative %: 73 % (ref 43–77)

## 2011-07-06 LAB — PRO B NATRIURETIC PEPTIDE: Pro B Natriuretic peptide (BNP): 753.8 pg/mL — ABNORMAL HIGH (ref 0–450)

## 2011-07-08 ENCOUNTER — Telehealth: Payer: Self-pay | Admitting: *Deleted

## 2011-07-08 ENCOUNTER — Encounter: Payer: Self-pay | Admitting: Cardiology

## 2011-07-08 LAB — BASIC METABOLIC PANEL
BUN: 12
CO2: 24
Calcium: 8.8
Chloride: 103
Creatinine, Ser: 0.79
GFR calc Af Amer: >60
Glucose, Bld: 187 — ABNORMAL HIGH

## 2011-07-08 LAB — URINE CULTURE

## 2011-07-08 LAB — URINALYSIS, ROUTINE W REFLEX MICROSCOPIC
Bilirubin Urine: NEGATIVE
Nitrite: POSITIVE — AB
Urobilinogen, UA: 0.2
pH: 7.5

## 2011-07-08 NOTE — Telephone Encounter (Signed)
Pt cannot take the pill of potassium therefore she asked can she take liquid asked Shellee Milo she gave the ok. Called pharmacy gave pt 1 year supply

## 2011-07-09 ENCOUNTER — Ambulatory Visit (INDEPENDENT_AMBULATORY_CARE_PROVIDER_SITE_OTHER): Payer: Self-pay | Admitting: Cardiovascular Disease

## 2011-07-09 ENCOUNTER — Ambulatory Visit (INDEPENDENT_AMBULATORY_CARE_PROVIDER_SITE_OTHER): Payer: Medicare Other | Admitting: Cardiology

## 2011-07-09 ENCOUNTER — Encounter: Payer: Self-pay | Admitting: Cardiology

## 2011-07-09 VITALS — BP 130/72 | HR 81 | Wt 222.0 lb

## 2011-07-09 DIAGNOSIS — R590 Localized enlarged lymph nodes: Secondary | ICD-10-CM | POA: Insufficient documentation

## 2011-07-09 DIAGNOSIS — Z9889 Other specified postprocedural states: Secondary | ICD-10-CM | POA: Insufficient documentation

## 2011-07-09 DIAGNOSIS — R0602 Shortness of breath: Secondary | ICD-10-CM | POA: Insufficient documentation

## 2011-07-09 DIAGNOSIS — I872 Venous insufficiency (chronic) (peripheral): Secondary | ICD-10-CM

## 2011-07-09 DIAGNOSIS — I5032 Chronic diastolic (congestive) heart failure: Secondary | ICD-10-CM | POA: Insufficient documentation

## 2011-07-09 DIAGNOSIS — I059 Rheumatic mitral valve disease, unspecified: Secondary | ICD-10-CM

## 2011-07-09 DIAGNOSIS — I340 Nonrheumatic mitral (valve) insufficiency: Secondary | ICD-10-CM | POA: Insufficient documentation

## 2011-07-09 DIAGNOSIS — Z950 Presence of cardiac pacemaker: Secondary | ICD-10-CM | POA: Insufficient documentation

## 2011-07-09 DIAGNOSIS — Z7901 Long term (current) use of anticoagulants: Secondary | ICD-10-CM | POA: Insufficient documentation

## 2011-07-09 DIAGNOSIS — Z79899 Other long term (current) drug therapy: Secondary | ICD-10-CM | POA: Insufficient documentation

## 2011-07-09 DIAGNOSIS — R9430 Abnormal result of cardiovascular function study, unspecified: Secondary | ICD-10-CM | POA: Insufficient documentation

## 2011-07-09 DIAGNOSIS — I82409 Acute embolism and thrombosis of unspecified deep veins of unspecified lower extremity: Secondary | ICD-10-CM | POA: Insufficient documentation

## 2011-07-09 DIAGNOSIS — I272 Pulmonary hypertension, unspecified: Secondary | ICD-10-CM | POA: Insufficient documentation

## 2011-07-09 DIAGNOSIS — R0989 Other specified symptoms and signs involving the circulatory and respiratory systems: Secondary | ICD-10-CM

## 2011-07-09 DIAGNOSIS — R55 Syncope and collapse: Secondary | ICD-10-CM | POA: Insufficient documentation

## 2011-07-09 DIAGNOSIS — I4892 Unspecified atrial flutter: Secondary | ICD-10-CM | POA: Insufficient documentation

## 2011-07-09 DIAGNOSIS — I495 Sick sinus syndrome: Secondary | ICD-10-CM | POA: Insufficient documentation

## 2011-07-09 DIAGNOSIS — I4891 Unspecified atrial fibrillation: Secondary | ICD-10-CM

## 2011-07-09 DIAGNOSIS — I779 Disorder of arteries and arterioles, unspecified: Secondary | ICD-10-CM | POA: Insufficient documentation

## 2011-07-09 DIAGNOSIS — Z853 Personal history of malignant neoplasm of breast: Secondary | ICD-10-CM | POA: Insufficient documentation

## 2011-07-09 DIAGNOSIS — I739 Peripheral vascular disease, unspecified: Secondary | ICD-10-CM

## 2011-07-09 DIAGNOSIS — Z86711 Personal history of pulmonary embolism: Secondary | ICD-10-CM | POA: Insufficient documentation

## 2011-07-09 DIAGNOSIS — Z5189 Encounter for other specified aftercare: Secondary | ICD-10-CM

## 2011-07-09 DIAGNOSIS — I509 Heart failure, unspecified: Secondary | ICD-10-CM

## 2011-07-09 NOTE — Progress Notes (Signed)
HPI Patient is seen today to followup fluid overload.  I saw her last in the office December, 2011.  In September, 2012 she underwent lumbar laminectomy.  She was discharged to the hospital but then seen in our office by Mr. Kathlen Mody to review her medications.  At that time her volume status appeared to be stable.  However after that time she began to retain fluid.  She had an unusual situation in that her sister was missing.  This actually went on for approximately one month.  She was developing continued swelling and her sister was found after she had wandered away from her home and passed away in the woods.  The patient had acute shortness of breath and went to the emergency room.  I have reviewed emergency room records including the doctor notes and the labs.  It was felt that she was mostly reacting to the stress of the loss of her sister.  There was some fluid overload and her diuretics were adjusted since that time she has diarrhea since she's feeling better.    Allergies  Allergen Reactions  . Iohexol     Current Outpatient Prescriptions  Medication Sig Dispense Refill  . acetaminophen (TYLENOL) 650 MG CR tablet Take 650 mg by mouth every 8 (eight) hours as needed.        Marland Kitchen anastrozole (ARIMIDEX) 1 MG tablet Take 1 mg by mouth daily.        . ciprofloxacin (CIPRO) 500 MG tablet Take 500 mg by mouth 2 (two) times daily.       . cyclobenzaprine (FLEXERIL) 10 MG tablet Take 10 mg by mouth as needed.       . doxycycline (VIBRA-TABS) 100 MG tablet Take 100 mg by mouth 2 (two) times daily.       . flecainide (TAMBOCOR) 100 MG tablet Take 100 mg by mouth 2 (two) times daily.        . furosemide (LASIX) 40 MG tablet Take 1 tablet (40 mg total) by mouth daily.  30 tablet  11  . HYDROcodone-acetaminophen (NORCO) 5-325 MG per tablet Take 1 tablet by mouth every 4 (four) hours as needed.       . metFORMIN (GLUMETZA) 500 MG (MOD) 24 hr tablet Take 500 mg by mouth. One and half tablet twice daily      .  polyethylene glycol (MIRALAX / GLYCOLAX) packet Take 17 g by mouth 2 (two) times daily.        . potassium chloride SA (KLOR-CON M20) 20 MEQ tablet Take 1 tablet (20 mEq total) by mouth daily.  30 tablet  11  . pyridoxine (B-6) 100 MG tablet Take 200 mg by mouth daily.        . vitamin B-12 (CYANOCOBALAMIN) 500 MCG tablet Take 500 mcg by mouth daily.        Marland Kitchen warfarin (COUMADIN) 5 MG tablet Take by mouth as directed.          History   Social History  . Marital Status: Widowed    Spouse Name: N/A    Number of Children: N/A  . Years of Education: N/A   Occupational History  . Not on file.   Social History Main Topics  . Smoking status: Former Smoker -- 4.0 packs/day    Quit date: 10/26/1998  . Smokeless tobacco: Never Used  . Alcohol Use: No  . Drug Use: No  . Sexually Active: Not on file   Other Topics Concern  . Not on file  Social History Narrative  . No narrative on file    Family History  Problem Relation Age of Onset  . Heart disease Mother   . COPD Father     Past Medical History  Diagnosis Date  . Hypertension   . Diabetes mellitus   . Tachy-brady syndrome     s/p pacer 07/2009  . Atrial fibrillation     flecainide; coumadin  . Atrial flutter     November, 2010, flecainide, Coumadin  . Chronic diastolic heart failure     echo 10/10: mild LVH, EF 55-60%, mild AI, mild MR, mild to mod LAE, mild RVE, severe RAE, mod TR, PASP 53  . Shortness of breath     myoview 11/10: EF 65%, no ischemia  . Pulmonary HTN     53 mmHg echo, 2010, moderate TR, mild right ventricular enlargement  . DVT (deep venous thrombosis)     Pulmonary embolus  . Pulmonary embolus 2006    2006, with DVT  . Carotid artery disease     [id number]: RICA [id number], LICA [id number]  . Thyroid nodule     non-neoplastic goiter  . Breast cancer   . Ejection fraction     EF 60%, echo, October, 2010  . Warfarin anticoagulation     Atrial fibrillation  . Pacemaker     November, 2010, Dr Allred  .  S/P laminectomy     June 22, 2011  . Syncope     2010, treated with pacemaker  . Drug therapy     FLECAINIDE  . Mitral regurgitation     mild, echo, October, 2010  . Mediastinal lymphadenopathy     Past Surgical History  Procedure Date  . Breast surgery   . Back surgery   . Abdominal hysterectomy   . Back surgery     1973  . Ankle surgery     ROS  Patient denies fever, chills, headache, sweats, rash, change in vision, change in hearing, chest pain, cough, urinary symptoms.  She has had some mild nausea related to some antibiotics that she was taking.  All other systems are reviewed and are negative.  PHYSICAL EXAm  Patient is stable today.  She is oriented to person time and place affect is normal.  We had a good talk about the loss of her sister and she is okay.  Head is atraumatic.  There is no jugular venous distention.  Lungs are clear cardiorespiratory effort is not labored.  Cardiac exam reveals S1 and S2.  The rhythm is regular.  The abdomen is soft.  There is trace peripheral edema.  This is greatly improved from the edema she had on October 6.  Abdomen is soft.  There are no skin rashes present no musculoskeletal deformities. Filed Vitals:   07/09/11 1424  BP: 130/72  Pulse: 81  Weight: 222 lb (100.699 kg)   EKG was done and reviewed by me.  She has sinus rhythm.  There is no change from the past. ASSESSMENT & PLAN

## 2011-07-09 NOTE — Assessment & Plan Note (Signed)
We will have to be careful to not over diurese her.  Venous insufficiency plays a role with her legs.

## 2011-07-09 NOTE — Assessment & Plan Note (Signed)
Mitral regurgitation was mild by echo.  No further workup at this time.

## 2011-07-09 NOTE — Assessment & Plan Note (Signed)
The patient had some volume overload after her return home from her laminectomy.  She then had increasing fluid overload and then she became more short of breath with the stress of the loss of her sister.  She is now stable.  She is diuresed.  I will keep her on 40 mg of Lasix daily for now with potassium.  She'll have followup chemistry in one week and I'll see her back in 4 weeks.

## 2011-07-09 NOTE — Patient Instructions (Signed)
Your physician recommends that you schedule a follow-up appointment in: [place] recommends that you continue on your current medications as directed. Please refer to the Current Medication list given to you today. Your physician recommends that you return for lab work in: BMET IN 7-10 DAYS  DX [phone]

## 2011-07-09 NOTE — Assessment & Plan Note (Signed)
Flecainide will be continued.  The QT interval today is normal.

## 2011-07-09 NOTE — Assessment & Plan Note (Signed)
The patient has maintained sinus rhythm despite all of the events that have gone on recently.  She will be continued on flecainide

## 2011-07-16 ENCOUNTER — Ambulatory Visit (INDEPENDENT_AMBULATORY_CARE_PROVIDER_SITE_OTHER): Payer: Self-pay | Admitting: Internal Medicine

## 2011-07-16 DIAGNOSIS — R0989 Other specified symptoms and signs involving the circulatory and respiratory systems: Secondary | ICD-10-CM

## 2011-07-16 LAB — POCT INR: INR: 2.6

## 2011-07-18 ENCOUNTER — Other Ambulatory Visit: Payer: Self-pay | Admitting: Oncology

## 2011-07-18 ENCOUNTER — Encounter (HOSPITAL_BASED_OUTPATIENT_CLINIC_OR_DEPARTMENT_OTHER): Payer: Medicare Other | Admitting: Oncology

## 2011-07-18 ENCOUNTER — Emergency Department (HOSPITAL_COMMUNITY)
Admission: EM | Admit: 2011-07-18 | Discharge: 2011-07-19 | Disposition: A | Discharge status: Home / Self Care | Payer: Medicare Other | Attending: Emergency Medicine | Admitting: Emergency Medicine

## 2011-07-18 DIAGNOSIS — E876 Hypokalemia: Secondary | ICD-10-CM | POA: Insufficient documentation

## 2011-07-18 DIAGNOSIS — Z950 Presence of cardiac pacemaker: Secondary | ICD-10-CM | POA: Insufficient documentation

## 2011-07-18 DIAGNOSIS — Z853 Personal history of malignant neoplasm of breast: Secondary | ICD-10-CM | POA: Insufficient documentation

## 2011-07-18 DIAGNOSIS — D696 Thrombocytopenia, unspecified: Secondary | ICD-10-CM

## 2011-07-18 DIAGNOSIS — Z7901 Long term (current) use of anticoagulants: Secondary | ICD-10-CM

## 2011-07-18 DIAGNOSIS — C50419 Malignant neoplasm of upper-outer quadrant of unspecified female breast: Secondary | ICD-10-CM

## 2011-07-18 DIAGNOSIS — Z86718 Personal history of other venous thrombosis and embolism: Secondary | ICD-10-CM | POA: Insufficient documentation

## 2011-07-18 DIAGNOSIS — D649 Anemia, unspecified: Secondary | ICD-10-CM

## 2011-07-18 DIAGNOSIS — E119 Type 2 diabetes mellitus without complications: Secondary | ICD-10-CM | POA: Insufficient documentation

## 2011-07-18 DIAGNOSIS — R339 Retention of urine, unspecified: Secondary | ICD-10-CM | POA: Insufficient documentation

## 2011-07-18 DIAGNOSIS — I10 Essential (primary) hypertension: Secondary | ICD-10-CM | POA: Insufficient documentation

## 2011-07-18 DIAGNOSIS — Z170 Estrogen receptor positive status [ER+]: Secondary | ICD-10-CM

## 2011-07-18 LAB — CBC WITH DIFFERENTIAL/PLATELET
BASO%: 0.9 % (ref 0.0–2.0)
Basophils Absolute: 0 10*3/uL (ref 0.0–0.1)
EOS%: 3.6 % (ref 0.0–7.0)
Eosinophils Absolute: 0.2 10*3/uL (ref 0.0–0.5)
HGB: 10.4 g/dL — ABNORMAL LOW (ref 11.6–15.9)
MCV: 84.1 fL (ref 79.5–101.0)
MONO#: 0.4 10*3/uL (ref 0.1–0.9)
MONO%: 8.6 % (ref 0.0–14.0)
NEUT#: 2.9 10*3/uL (ref 1.5–6.5)
RBC: 3.95 10*6/uL (ref 3.70–5.45)
RDW: 14.7 % — ABNORMAL HIGH (ref 11.2–14.5)
WBC: 4.4 10*3/uL (ref 3.9–10.3)
lymph#: 0.9 10*3/uL (ref 0.9–3.3)

## 2011-07-18 LAB — COMPREHENSIVE METABOLIC PANEL
ALT: 19 U/L (ref 0–35)
AST: 29 U/L (ref 0–37)
CO2: 21 mEq/L (ref 19–32)
Creatinine, Ser: 1.07 mg/dL (ref 0.50–1.10)
Total Bilirubin: 0.6 mg/dL (ref 0.3–1.2)

## 2011-07-18 LAB — FERRITIN: Ferritin: 22 ng/mL (ref 10–291)

## 2011-07-18 LAB — IRON AND TIBC
%SAT: 6 % — ABNORMAL LOW (ref 20–55)
Iron: 26 ug/dL — ABNORMAL LOW (ref 42–145)
TIBC: 411 ug/dL (ref 250–470)

## 2011-07-19 ENCOUNTER — Other Ambulatory Visit: Payer: Medicare Other | Admitting: *Deleted

## 2011-07-19 ENCOUNTER — Telehealth: Payer: Self-pay | Admitting: *Deleted

## 2011-07-19 LAB — DIFFERENTIAL
Lymphocytes Relative: 20 % (ref 12–46)
Lymphs Abs: 1 10*3/uL (ref 0.7–4.0)
Monocytes Relative: 12 % (ref 3–12)
Neutro Abs: 2.9 10*3/uL (ref 1.7–7.7)
Neutrophils Relative %: 60 % (ref 43–77)

## 2011-07-19 LAB — BASIC METABOLIC PANEL
BUN: 10 mg/dL (ref 6–23)
Chloride: 100 mEq/L (ref 96–112)
Glucose, Bld: 101 mg/dL — ABNORMAL HIGH (ref 70–99)
Potassium: 3.4 mEq/L — ABNORMAL LOW (ref 3.5–5.1)
Sodium: 139 mEq/L (ref 135–145)

## 2011-07-19 LAB — URINALYSIS, ROUTINE W REFLEX MICROSCOPIC
Bilirubin Urine: NEGATIVE
Ketones, ur: NEGATIVE mg/dL
Protein, ur: 100 mg/dL — AB
Urobilinogen, UA: 0.2 mg/dL (ref 0.0–1.0)

## 2011-07-19 LAB — URINE MICROSCOPIC-ADD ON

## 2011-07-19 LAB — CBC
HCT: 30 % — ABNORMAL LOW (ref 36.0–46.0)
Hemoglobin: 9.6 g/dL — ABNORMAL LOW (ref 12.0–15.0)
MCH: 27 pg (ref 26.0–34.0)
MCV: 84.5 fL (ref 78.0–100.0)
RBC: 3.55 MIL/uL — ABNORMAL LOW (ref 3.87–5.11)
WBC: 4.8 10*3/uL (ref 4.0–10.5)

## 2011-07-19 NOTE — Telephone Encounter (Signed)
PT AWARE TO DOUBLE UP ON POTASSIUM CURRENTLY IS TAKING LIQUID 1 TSP  A DAY INSTRUCTED TO TAKE 2 TSP  DAY

## 2011-07-19 NOTE — Telephone Encounter (Signed)
LEFT MESSAGE RE LAB WORK DONE AT [facility]  KIDNEY FUNCTION NORM AND   POTASSIUM VALUE WAS 3.4  ON 07-19-11 PER DR  KATZ  HAVE PT DOUBLE  UP ON POTASSIUM .Adonis Housekeeper

## 2011-07-20 LAB — URINE CULTURE
Colony Count: NO GROWTH
Culture  Setup Time: 201210190351

## 2011-07-21 ENCOUNTER — Other Ambulatory Visit: Payer: Self-pay | Admitting: Cardiology

## 2011-07-24 ENCOUNTER — Other Ambulatory Visit: Payer: Self-pay | Admitting: Oncology

## 2011-07-24 DIAGNOSIS — C50919 Malignant neoplasm of unspecified site of unspecified female breast: Secondary | ICD-10-CM

## 2011-07-24 DIAGNOSIS — M549 Dorsalgia, unspecified: Secondary | ICD-10-CM

## 2011-07-26 ENCOUNTER — Ambulatory Visit: Payer: Medicare Other | Admitting: Vascular Surgery

## 2011-07-27 ENCOUNTER — Emergency Department (HOSPITAL_COMMUNITY)
Admission: EM | Admit: 2011-07-27 | Discharge: 2011-07-27 | Disposition: A | Discharge status: Home / Self Care | Payer: Medicare Other | Attending: Emergency Medicine | Admitting: Emergency Medicine

## 2011-07-27 DIAGNOSIS — Z79899 Other long term (current) drug therapy: Secondary | ICD-10-CM | POA: Insufficient documentation

## 2011-07-27 DIAGNOSIS — Y846 Urinary catheterization as the cause of abnormal reaction of the patient, or of later complication, without mention of misadventure at the time of the procedure: Secondary | ICD-10-CM | POA: Insufficient documentation

## 2011-07-27 DIAGNOSIS — T83091A Other mechanical complication of indwelling urethral catheter, initial encounter: Secondary | ICD-10-CM | POA: Insufficient documentation

## 2011-07-29 ENCOUNTER — Ambulatory Visit: Payer: Medicare Other | Admitting: Cardiology

## 2011-07-29 ENCOUNTER — Ambulatory Visit (INDEPENDENT_AMBULATORY_CARE_PROVIDER_SITE_OTHER): Payer: Medicare Other | Admitting: *Deleted

## 2011-07-29 DIAGNOSIS — Z86718 Personal history of other venous thrombosis and embolism: Secondary | ICD-10-CM

## 2011-07-29 DIAGNOSIS — I4892 Unspecified atrial flutter: Secondary | ICD-10-CM

## 2011-07-29 DIAGNOSIS — Z8679 Personal history of other diseases of the circulatory system: Secondary | ICD-10-CM

## 2011-07-29 DIAGNOSIS — Z7901 Long term (current) use of anticoagulants: Secondary | ICD-10-CM

## 2011-07-29 DIAGNOSIS — I2699 Other pulmonary embolism without acute cor pulmonale: Secondary | ICD-10-CM

## 2011-07-29 DIAGNOSIS — I4891 Unspecified atrial fibrillation: Secondary | ICD-10-CM

## 2011-07-29 DIAGNOSIS — I82409 Acute embolism and thrombosis of unspecified deep veins of unspecified lower extremity: Secondary | ICD-10-CM

## 2011-08-05 ENCOUNTER — Encounter: Payer: Self-pay | Admitting: Cardiology

## 2011-08-05 ENCOUNTER — Ambulatory Visit (INDEPENDENT_AMBULATORY_CARE_PROVIDER_SITE_OTHER): Payer: Medicare Other | Admitting: Cardiology

## 2011-08-05 DIAGNOSIS — E876 Hypokalemia: Secondary | ICD-10-CM | POA: Insufficient documentation

## 2011-08-05 DIAGNOSIS — I509 Heart failure, unspecified: Secondary | ICD-10-CM

## 2011-08-05 DIAGNOSIS — I5032 Chronic diastolic (congestive) heart failure: Secondary | ICD-10-CM

## 2011-08-05 DIAGNOSIS — I4891 Unspecified atrial fibrillation: Secondary | ICD-10-CM

## 2011-08-05 LAB — BASIC METABOLIC PANEL
BUN: 9 mg/dL (ref 6–23)
Calcium: 9.4 mg/dL (ref 8.4–10.5)
Creatinine, Ser: 1 mg/dL (ref 0.4–1.2)
GFR: 72.14 mL/min (ref >60.00)
Glucose, Bld: 151 mg/dL — ABNORMAL HIGH (ref 70–99)

## 2011-08-05 NOTE — Assessment & Plan Note (Signed)
Diastolic heart failure is under control on her current dose of Lasix.  Chemistry will be checked again today to be sure that her renal function is stable and her potassium was improved since her potassium dose was increased on July 19, 2011.  We'll be in touch with her about her chemistry result.  All plan to see her back in 6 months.

## 2011-08-05 NOTE — Patient Instructions (Signed)
Your physician wants you to follow-up in: 6 months.   You will receive a reminder letter in the mail two months in advance. If you don't receive a letter, please call our office to schedule the follow-up appointment.  Your physician recommends that you return for lab work in: today Artist)

## 2011-08-05 NOTE — Progress Notes (Signed)
HPI Patient is seen for followup congestive heart failure.  She had a lot of turmoil in her family and had an episode of shortness of breath.  There was some volume overload and eventually with a small dose of diuretic she stabilized.  I saw her last in the office July 09, 2011.  I kept her on Lasix 40 mg daily.  Followup lab 10 days later showed that her renal function was stable.  Her potassium was decreased to 3.4.  We contacted her to increase her potassium. Allergies  Allergen Reactions  . Iohexol     Current Outpatient Prescriptions  Medication Sig Dispense Refill  . anastrozole (ARIMIDEX) 1 MG tablet Take 1 mg by mouth daily.        . ciprofloxacin (CIPRO) 500 MG tablet Take 500 mg by mouth 2 (two) times daily.       . cyclobenzaprine (FLEXERIL) 10 MG tablet Take 10 mg by mouth as needed.       . doxycycline (VIBRA-TABS) 100 MG tablet Take 100 mg by mouth 2 (two) times daily.       . flecainide (TAMBOCOR) 100 MG tablet Take 100 mg by mouth 2 (two) times daily.        . furosemide (LASIX) 40 MG tablet Take 1 tablet (40 mg total) by mouth daily.  30 tablet  11  . HYDROcodone-acetaminophen (NORCO) 5-325 MG per tablet Take 1 tablet by mouth every 4 (four) hours as needed.       . metFORMIN (GLUMETZA) 500 MG (MOD) 24 hr tablet Take 500 mg by mouth. One and half tablet twice daily      . polyethylene glycol (MIRALAX / GLYCOLAX) packet Take 17 g by mouth 2 (two) times daily.        . potassium chloride SA (KLOR-CON M20) 20 MEQ tablet Take 1 tablet (20 mEq total) by mouth daily.  30 tablet  11  . pyridoxine (B-6) 100 MG tablet Take 200 mg by mouth daily.        . vitamin B-12 (CYANOCOBALAMIN) 500 MCG tablet Take 500 mcg by mouth daily.        Marland Kitchen warfarin (COUMADIN) 5 MG tablet TAKE AS DIRECTED BY COUMADIN CLINIC  45 tablet  3    History   Social History  . Marital Status: Widowed    Spouse Name: N/A    Number of Children: N/A  . Years of Education: N/A   Occupational History  . Not  on file.   Social History Main Topics  . Smoking status: Former Smoker -- 4.0 packs/day    Quit date: 10/26/1998  . Smokeless tobacco: Never Used  . Alcohol Use: No  . Drug Use: No  . Sexually Active: Not on file   Other Topics Concern  . Not on file   Social History Narrative  . No narrative on file    Family History  Problem Relation Age of Onset  . Heart disease Mother   . COPD Father     Past Medical History  Diagnosis Date  . Hypertension   . Diabetes mellitus   . Tachy-brady syndrome     s/p pacer 07/2009  . Atrial fibrillation     flecainide; coumadin  . Atrial flutter     November, 2010, flecainide, Coumadin  . Chronic diastolic heart failure     echo 10/10: mild LVH, EF 55-60%, mild AI, mild MR, mild to mod LAE, mild RVE, severe RAE, mod TR, PASP 53  .  Shortness of breath     myoview 11/10: EF 65%, no ischemia  . Pulmonary HTN     53 mmHg echo, 2010, moderate TR, mild right ventricular enlargement  . DVT (deep venous thrombosis)     Pulmonary embolus  . Pulmonary embolus 2006    2006, with DVT  . Carotid artery disease     [id number]: RICA [id number], LICA 123456  . Thyroid nodule     non-neoplastic goiter  . Breast cancer   . Ejection fraction     EF 60%, echo, October, 2010  . Warfarin anticoagulation     Atrial fibrillation  . Pacemaker     November, 2010, Dr Allred  . S/P laminectomy     June 22, 2011  . Syncope     2010, treated with pacemaker  . Drug therapy     FLECAINIDE  . Mitral regurgitation     mild, echo, October, 2010  . Mediastinal lymphadenopathy   . Venous insufficiency     Chronic    Past Surgical History  Procedure Date  . Breast surgery   . Back surgery   . Abdominal hysterectomy   . Back surgery     1973  . Ankle surgery     ROS  Patient denies fever, chills, headache, sweats, rash, change in vision, change in hearing, chest pain, cough, nausea vomiting, earache symptoms.  All other systems are reviewed and are  negative. PHYSICAL EXAM Patient looked stable today.  She's not had any chest pain.  She's not having shortness of breath.  She is not complaining of swelling.  She is oriented to person time and place.  Affect is normal.  Head is atraumatic.  There is no jugular venous distention.  Lungs are clear.  Cardiac exam reveals S1-S2.  No clicks or significant murmurs.  The abdomen is soft.  There is no peripheral edema. Filed Vitals:   08/05/11 0931  BP: 132/70  Pulse: 71  Resp: 18  Height: 6' (1.829 m)  Weight: 221 lb 3.2 oz (100.336 kg)    EKG is not done today.  ASSESSMENT & PLAN

## 2011-08-05 NOTE — Assessment & Plan Note (Signed)
Chemistry to be checked today to be sure that her potassium is okay.

## 2011-08-05 NOTE — Assessment & Plan Note (Signed)
Rhythm is stable.  No change in therapy.

## 2011-08-21 ENCOUNTER — Encounter (INDEPENDENT_AMBULATORY_CARE_PROVIDER_SITE_OTHER): Payer: Self-pay | Admitting: Surgery

## 2011-08-26 ENCOUNTER — Encounter: Payer: Medicare Other | Admitting: *Deleted

## 2011-09-03 ENCOUNTER — Ambulatory Visit: Payer: Medicare Other | Admitting: Cardiology

## 2011-09-04 ENCOUNTER — Encounter (INDEPENDENT_AMBULATORY_CARE_PROVIDER_SITE_OTHER): Payer: Self-pay | Admitting: Surgery

## 2011-09-09 ENCOUNTER — Ambulatory Visit (INDEPENDENT_AMBULATORY_CARE_PROVIDER_SITE_OTHER): Payer: Self-pay | Admitting: Surgery

## 2011-10-05 ENCOUNTER — Telehealth: Payer: Self-pay | Admitting: Oncology

## 2011-10-05 NOTE — Telephone Encounter (Signed)
Called pt to schedule lab before CT on 10/14/11, waiting for a call back.

## 2011-10-07 ENCOUNTER — Ambulatory Visit (INDEPENDENT_AMBULATORY_CARE_PROVIDER_SITE_OTHER): Payer: Medicare Other | Admitting: *Deleted

## 2011-10-07 DIAGNOSIS — Z86718 Personal history of other venous thrombosis and embolism: Secondary | ICD-10-CM

## 2011-10-07 DIAGNOSIS — I82409 Acute embolism and thrombosis of unspecified deep veins of unspecified lower extremity: Secondary | ICD-10-CM

## 2011-10-07 DIAGNOSIS — I4891 Unspecified atrial fibrillation: Secondary | ICD-10-CM

## 2011-10-07 DIAGNOSIS — Z8679 Personal history of other diseases of the circulatory system: Secondary | ICD-10-CM

## 2011-10-07 DIAGNOSIS — I4892 Unspecified atrial flutter: Secondary | ICD-10-CM

## 2011-10-07 DIAGNOSIS — I2699 Other pulmonary embolism without acute cor pulmonale: Secondary | ICD-10-CM

## 2011-10-07 DIAGNOSIS — Z7901 Long term (current) use of anticoagulants: Secondary | ICD-10-CM

## 2011-10-11 ENCOUNTER — Other Ambulatory Visit: Payer: Self-pay | Admitting: Oncology

## 2011-10-11 DIAGNOSIS — C50919 Malignant neoplasm of unspecified site of unspecified female breast: Secondary | ICD-10-CM

## 2011-10-14 ENCOUNTER — Other Ambulatory Visit: Payer: Self-pay | Admitting: *Deleted

## 2011-10-14 ENCOUNTER — Ambulatory Visit (HOSPITAL_COMMUNITY)
Admission: RE | Admit: 2011-10-14 | Discharge: 2011-10-14 | Disposition: A | Discharge status: Home / Self Care | Payer: Medicare Other | Source: Ambulatory Visit | Attending: Oncology | Admitting: Oncology

## 2011-10-14 ENCOUNTER — Encounter (HOSPITAL_COMMUNITY): Payer: Self-pay

## 2011-10-14 ENCOUNTER — Telehealth: Payer: Self-pay | Admitting: *Deleted

## 2011-10-14 ENCOUNTER — Other Ambulatory Visit: Payer: Self-pay | Admitting: Oncology

## 2011-10-14 ENCOUNTER — Other Ambulatory Visit (HOSPITAL_BASED_OUTPATIENT_CLINIC_OR_DEPARTMENT_OTHER): Payer: Medicare Other

## 2011-10-14 DIAGNOSIS — I700 Atherosclerosis of aorta: Secondary | ICD-10-CM | POA: Insufficient documentation

## 2011-10-14 DIAGNOSIS — R0602 Shortness of breath: Secondary | ICD-10-CM | POA: Insufficient documentation

## 2011-10-14 DIAGNOSIS — Z853 Personal history of malignant neoplasm of breast: Secondary | ICD-10-CM | POA: Insufficient documentation

## 2011-10-14 DIAGNOSIS — M549 Dorsalgia, unspecified: Secondary | ICD-10-CM | POA: Insufficient documentation

## 2011-10-14 DIAGNOSIS — Z950 Presence of cardiac pacemaker: Secondary | ICD-10-CM | POA: Insufficient documentation

## 2011-10-14 DIAGNOSIS — C50919 Malignant neoplasm of unspecified site of unspecified female breast: Secondary | ICD-10-CM

## 2011-10-14 DIAGNOSIS — C50419 Malignant neoplasm of upper-outer quadrant of unspecified female breast: Secondary | ICD-10-CM

## 2011-10-14 DIAGNOSIS — K449 Diaphragmatic hernia without obstruction or gangrene: Secondary | ICD-10-CM | POA: Insufficient documentation

## 2011-10-14 DIAGNOSIS — I2510 Atherosclerotic heart disease of native coronary artery without angina pectoris: Secondary | ICD-10-CM | POA: Insufficient documentation

## 2011-10-14 DIAGNOSIS — R911 Solitary pulmonary nodule: Secondary | ICD-10-CM | POA: Insufficient documentation

## 2011-10-14 LAB — CBC WITH DIFFERENTIAL/PLATELET
Basophils Absolute: 0 10*3/uL (ref 0.0–0.1)
EOS%: 3.9 % (ref 0.0–7.0)
MCH: 28.1 pg (ref 25.1–34.0)
MCHC: 33.6 g/dL (ref 31.5–36.0)
MCV: 83.8 fL (ref 79.5–101.0)
MONO%: 8 % (ref 0.0–14.0)
RBC: 3.81 10*6/uL (ref 3.70–5.45)
RDW: 17.3 % — ABNORMAL HIGH (ref 11.2–14.5)

## 2011-10-14 LAB — CMP (CANCER CENTER ONLY)
AST: 15 U/L (ref 11–38)
Albumin: 2.9 g/dL — ABNORMAL LOW (ref 3.3–5.5)
Alkaline Phosphatase: 109 U/L — ABNORMAL HIGH (ref 26–84)
BUN, Bld: 14 mg/dL (ref 7–22)
Potassium: 4.3 mEq/L (ref 3.3–4.7)
Sodium: 140 mEq/L (ref 128–145)

## 2011-10-14 NOTE — Telephone Encounter (Signed)
[name] [name] called reporting patient is allergic to contrast Iohexol.  No Pre-medication given today.  [name] [name] asked if test can be done without contrast or if it should be rescheduled.  The allergy was noted in 2006.  Patient's last scan was in 2009 and she does not recall being pre-medicated.  Verbal order received and read back from Dr. Lamonte Sakai to do CT chest without contrast.

## 2011-10-14 NOTE — Telephone Encounter (Signed)
Attempted to call pt several times to give results of her CBC and CT per Dr. Lamonte Sakai.  Left VM on home number for her to return call for results.

## 2011-10-14 NOTE — Telephone Encounter (Signed)
Was able to reach pt and relay Dr. Agustina Caroli message that her Hgb is improving and her CT of chest did not show any signs of cancer.  Pt verbalized understanding.

## 2011-10-14 NOTE — Telephone Encounter (Signed)
Message copied by Maudie Mercury on Mon Oct 14, 2011  1:52 PM ------      Message from: HA, Trudee Grip T      Created: Mon Oct 14, 2011  9:47 AM       Please call patient.  Please let her know that her Hgb has improved.  Continue observation.  Thanks.

## 2011-10-22 ENCOUNTER — Encounter: Payer: Medicare Other | Admitting: *Deleted

## 2011-11-05 ENCOUNTER — Other Ambulatory Visit: Payer: Self-pay | Admitting: *Deleted

## 2011-11-05 MED ORDER — FLECAINIDE ACETATE 100 MG PO TABS: 100.0000 mg | ORAL_TABLET | Freq: Two times a day (BID) | ORAL | Status: DC

## 2011-11-07 ENCOUNTER — Encounter: Payer: Self-pay | Admitting: Internal Medicine

## 2011-11-07 ENCOUNTER — Ambulatory Visit (INDEPENDENT_AMBULATORY_CARE_PROVIDER_SITE_OTHER): Payer: Medicare Other | Admitting: *Deleted

## 2011-11-07 ENCOUNTER — Ambulatory Visit (INDEPENDENT_AMBULATORY_CARE_PROVIDER_SITE_OTHER): Payer: Medicare Other | Admitting: Internal Medicine

## 2011-11-07 DIAGNOSIS — Z950 Presence of cardiac pacemaker: Secondary | ICD-10-CM

## 2011-11-07 DIAGNOSIS — I82409 Acute embolism and thrombosis of unspecified deep veins of unspecified lower extremity: Secondary | ICD-10-CM

## 2011-11-07 DIAGNOSIS — I4891 Unspecified atrial fibrillation: Secondary | ICD-10-CM

## 2011-11-07 DIAGNOSIS — Z8679 Personal history of other diseases of the circulatory system: Secondary | ICD-10-CM

## 2011-11-07 DIAGNOSIS — I495 Sick sinus syndrome: Secondary | ICD-10-CM

## 2011-11-07 DIAGNOSIS — I2699 Other pulmonary embolism without acute cor pulmonale: Secondary | ICD-10-CM

## 2011-11-07 DIAGNOSIS — I4892 Unspecified atrial flutter: Secondary | ICD-10-CM

## 2011-11-07 DIAGNOSIS — Z7901 Long term (current) use of anticoagulants: Secondary | ICD-10-CM

## 2011-11-07 DIAGNOSIS — Z86718 Personal history of other venous thrombosis and embolism: Secondary | ICD-10-CM

## 2011-11-07 LAB — PACEMAKER DEVICE OBSERVATION
AL AMPLITUDE: 2.8 mv
AL IMPEDENCE PM: 410 Ohm
BAMS-0001: 175 {beats}/min
BATTERY VOLTAGE: 2.79 V
RV LEAD AMPLITUDE: 11.2 mv
VENTRICULAR PACING PM: 0

## 2011-11-07 LAB — POCT INR: INR: 4.1

## 2011-11-07 MED ORDER — METOPROLOL SUCCINATE ER 25 MG PO TB24: 25.0000 mg | ORAL_TABLET | Freq: Every day | ORAL | Status: DC

## 2011-11-07 NOTE — Assessment & Plan Note (Signed)
Controlled with flecainide, though when she has afib, V rates are elevated Add toprol XL 25mg  dialy today  Labile INRs recently. I would recommend that she switch to xarelto (this has both PTE and afib indication).  She will contemplate this option and contact our office if she wishes to switch.  She may benefit from stress test upon follow-up with Dr Ron Parker as she is on flecainide.  I will let him make this decision.

## 2011-11-07 NOTE — Patient Instructions (Addendum)
Your physician wants you to follow-up in: 1 year with Dr. Vallery Ridge will receive a reminder letter in the mail two months in advance. If you don't receive a letter, please call our office to schedule the follow-up appointment.  Your physician wants you to follow-up in the device clinic for a pacer check in 6 months. You will receive a reminder letter in the mail two months in advance. If you don't receive a letter, please call our office to schedule the follow-up appointment.  Start Toprol XL 25mg  daily.

## 2011-11-07 NOTE — Assessment & Plan Note (Signed)
Normal pacemaker function See Pace Art report No changes today  

## 2011-11-07 NOTE — Progress Notes (Signed)
PCP:  Foye Spurling, MD, MD Primary Cardiologist:  Dr Ron Parker  The patient presents today for routine electrophysiology followup.  Since last being seen in our clinic, the patient reports doing reasonably well.  She has had significant social stress with the death of her sister and her brother's advanced dementia.  She remains very active despite her age.  Today, she denies symptoms of palpitations, chest pain, shortness of breath, orthopnea, PND, lower extremity edema, dizziness, presyncope, syncope, or neurologic sequela.  The patient feels that she is tolerating medications without difficulties and is otherwise without complaint today.   Past Medical History  Diagnosis Date  . Hypertension   . Diabetes mellitus   . Tachy-brady syndrome     s/p pacer 07/2009  . Atrial fibrillation     flecainide; coumadin  . Atrial flutter     November, 2010, flecainide, Coumadin  . Chronic diastolic heart failure     echo 10/10: mild LVH, EF 55-60%, mild AI, mild MR, mild to mod LAE, mild RVE, severe RAE, mod TR, PASP 53  . Shortness of breath     myoview 11/10: EF 65%, no ischemia  . Pulmonary HTN     53 mmHg echo, 2010, moderate TR, mild right ventricular enlargement  . DVT (deep venous thrombosis)     Pulmonary embolus  . Pulmonary embolus 2006    2006, with DVT  . Carotid artery disease     [id number]: RICA [id number], LICA [id number]  . Thyroid nodule     non-neoplastic goiter  . Breast cancer   . Ejection fraction     EF 60%, echo, October, 2010  . Warfarin anticoagulation     Atrial fibrillation  . Pacemaker     November, 2010, Dr Islam Eichinger  . S/P laminectomy     June 22, 2011  . Syncope     2010, treated with pacemaker  . Drug therapy     FLECAINIDE  . Mitral regurgitation     mild, echo, October, 2010  . Mediastinal lymphadenopathy   . Venous insufficiency     Chronic  . Hypokalemia     October, 2012, potassium dose increased   Past Surgical History  Procedure Date  . Breast surgery    . Back surgery   . Abdominal hysterectomy   . Back surgery     1973  . Ankle surgery     Current Outpatient Prescriptions  Medication Sig Dispense Refill  . anastrozole (ARIMIDEX) 1 MG tablet Take 1 mg by mouth daily.        . ciprofloxacin (CIPRO) 500 MG tablet Take 500 mg by mouth 2 (two) times daily.       . cyclobenzaprine (FLEXERIL) 10 MG tablet Take 10 mg by mouth as needed.       . doxycycline (VIBRA-TABS) 100 MG tablet Take 100 mg by mouth 2 (two) times daily.       . flecainide (TAMBOCOR) 100 MG tablet Take 1 tablet (100 mg total) by mouth 2 (two) times daily.  60 tablet  6  . furosemide (LASIX) 40 MG tablet Take 1 tablet (40 mg total) by mouth daily.  30 tablet  11  . HYDROcodone-acetaminophen (NORCO) 5-325 MG per tablet Take 1 tablet by mouth every 4 (four) hours as needed.       . metFORMIN (GLUMETZA) 500 MG (MOD) 24 hr tablet Take 500 mg by mouth. One and half tablet twice daily      . polyethylene glycol (MIRALAX / GLYCOLAX)  packet Take 17 g by mouth 2 (two) times daily.        . potassium chloride SA (KLOR-CON M20) 20 MEQ tablet Take 1 tablet (20 mEq total) by mouth daily.  30 tablet  11  . pyridoxine (B-6) 100 MG tablet Take 200 mg by mouth daily.        . vitamin B-12 (CYANOCOBALAMIN) 500 MCG tablet Take 500 mcg by mouth daily.        Marland Kitchen warfarin (COUMADIN) 5 MG tablet TAKE AS DIRECTED BY COUMADIN CLINIC  45 tablet  3    Allergies  Allergen Reactions  . Iohexol     History   Social History  . Marital Status: Widowed    Spouse Name: N/A    Number of Children: N/A  . Years of Education: N/A   Occupational History  . Not on file.   Social History Main Topics  . Smoking status: Former Smoker -- 4.0 packs/day    Quit date: 10/26/1998  . Smokeless tobacco: Never Used  . Alcohol Use: No  . Drug Use: No  . Sexually Active: Not on file   Other Topics Concern  . Not on file   Social History Narrative  . No narrative on file    Family History  Problem  Relation Age of Onset  . Heart disease Mother   . COPD Father     ROS-  All systems are reviewed and are negative except as outlined in the HPI above   ' Physical Exam: Filed Vitals:   11/07/11 1023  BP: 134/70  Pulse: 81  Resp: 18  Height: 6' (1.829 m)  Weight: 205 lb 6.4 oz (93.169 kg)    GEN- The patient is well appearing, alert and oriented x 3 today.   Head- normocephalic, atraumatic Eyes-  Sclera clear, conjunctiva pink Ears- hearing intact Oropharynx- clear Neck- supple, no JVP Lymph- no cervical lymphadenopathy Lungs- Clear to ausculation bilaterally, normal work of breathing Chest- pacemaker pocket is well healed Heart- Regular rate and rhythm, no murmurs, rubs or gallops, PMI not laterally displaced GI- soft, NT, ND, + BS Extremities- no clubbing, cyanosis, or edema   Pacemaker interrogation- reviewed in detail today,  See PACEART report  Assessment and Plan:

## 2011-11-25 ENCOUNTER — Ambulatory Visit (INDEPENDENT_AMBULATORY_CARE_PROVIDER_SITE_OTHER): Payer: Medicare Other | Admitting: Pharmacist

## 2011-11-25 DIAGNOSIS — I4891 Unspecified atrial fibrillation: Secondary | ICD-10-CM

## 2011-11-25 DIAGNOSIS — Z7901 Long term (current) use of anticoagulants: Secondary | ICD-10-CM

## 2011-11-25 DIAGNOSIS — I2699 Other pulmonary embolism without acute cor pulmonale: Secondary | ICD-10-CM

## 2011-11-30 ENCOUNTER — Encounter: Payer: Self-pay | Admitting: Internal Medicine

## 2011-11-30 ENCOUNTER — Emergency Department: Payer: Self-pay | Admitting: *Deleted

## 2011-11-30 ENCOUNTER — Encounter: Payer: Self-pay | Admitting: Cardiology

## 2011-11-30 LAB — COMPREHENSIVE METABOLIC PANEL
Anion Gap: 14 (ref 7–16)
BUN: 25 mg/dL — ABNORMAL HIGH (ref 7–18)
Bilirubin,Total: 0.7 mg/dL (ref 0.2–1.0)
Chloride: 101 mmol/L (ref 98–107)
Creatinine: 1.57 mg/dL — ABNORMAL HIGH (ref 0.60–1.30)
EGFR (African American): 41 — ABNORMAL LOW
Glucose: 137 mg/dL — ABNORMAL HIGH (ref 65–99)
Osmolality: 284 (ref 275–301)
Potassium: 4.2 mmol/L (ref 3.5–5.1)
SGOT(AST): 25 U/L (ref 15–37)
Sodium: 139 mmol/L (ref 136–145)
Total Protein: 8.4 g/dL — ABNORMAL HIGH (ref 6.4–8.2)

## 2011-11-30 LAB — CBC
HGB: 11.8 g/dL — ABNORMAL LOW (ref 12.0–16.0)
MCHC: 32.8 g/dL (ref 32.0–36.0)
MCV: 88 fL (ref 80–100)
RBC: 4.08 10*6/uL (ref 3.80–5.20)
WBC: 4 10*3/uL (ref 3.6–11.0)

## 2011-11-30 LAB — URINALYSIS, COMPLETE
Blood: NEGATIVE
Glucose,UR: NEGATIVE mg/dL (ref 0–75)
Ketone: NEGATIVE
Nitrite: POSITIVE
Ph: 6 (ref 4.5–8.0)
RBC,UR: 17 /HPF (ref 0–5)
Specific Gravity: 1.008 (ref 1.003–1.030)
Squamous Epithelial: <1
WBC UR: 666 /HPF (ref 0–5)

## 2011-11-30 LAB — TSH: Thyroid Stimulating Horm: 2.86 u[IU]/mL

## 2011-11-30 LAB — TROPONIN I: Troponin-I: <0.02 ng/mL

## 2011-12-02 ENCOUNTER — Telehealth: Payer: Self-pay | Admitting: Cardiology

## 2011-12-02 NOTE — Telephone Encounter (Signed)
[name] [name] faxed to [facility], Records received back  Gave to [employer] [facility]) 12/02/11/KM

## 2011-12-09 ENCOUNTER — Ambulatory Visit (INDEPENDENT_AMBULATORY_CARE_PROVIDER_SITE_OTHER): Payer: Medicare Other | Admitting: *Deleted

## 2011-12-09 DIAGNOSIS — I2699 Other pulmonary embolism without acute cor pulmonale: Secondary | ICD-10-CM

## 2011-12-09 DIAGNOSIS — Z7901 Long term (current) use of anticoagulants: Secondary | ICD-10-CM

## 2011-12-09 DIAGNOSIS — I4891 Unspecified atrial fibrillation: Secondary | ICD-10-CM

## 2011-12-09 LAB — POCT INR: INR: 2

## 2011-12-20 ENCOUNTER — Other Ambulatory Visit: Payer: Self-pay | Admitting: Oncology

## 2011-12-30 ENCOUNTER — Ambulatory Visit (INDEPENDENT_AMBULATORY_CARE_PROVIDER_SITE_OTHER): Payer: Medicare Other | Admitting: *Deleted

## 2011-12-30 DIAGNOSIS — Z7901 Long term (current) use of anticoagulants: Secondary | ICD-10-CM

## 2011-12-30 DIAGNOSIS — I4891 Unspecified atrial fibrillation: Secondary | ICD-10-CM

## 2011-12-30 DIAGNOSIS — I2699 Other pulmonary embolism without acute cor pulmonale: Secondary | ICD-10-CM

## 2011-12-30 LAB — POCT INR: INR: 2.5

## 2012-01-16 ENCOUNTER — Ambulatory Visit: Payer: Medicare Other | Admitting: Oncology

## 2012-01-16 ENCOUNTER — Other Ambulatory Visit: Payer: Medicare Other | Admitting: Lab

## 2012-01-27 ENCOUNTER — Ambulatory Visit (INDEPENDENT_AMBULATORY_CARE_PROVIDER_SITE_OTHER): Payer: Medicare Other | Admitting: *Deleted

## 2012-01-27 DIAGNOSIS — I4891 Unspecified atrial fibrillation: Secondary | ICD-10-CM

## 2012-01-27 DIAGNOSIS — I2699 Other pulmonary embolism without acute cor pulmonale: Secondary | ICD-10-CM

## 2012-01-27 DIAGNOSIS — Z7901 Long term (current) use of anticoagulants: Secondary | ICD-10-CM

## 2012-02-03 ENCOUNTER — Other Ambulatory Visit: Payer: Self-pay | Admitting: Internal Medicine

## 2012-02-03 DIAGNOSIS — Z1231 Encounter for screening mammogram for malignant neoplasm of breast: Secondary | ICD-10-CM

## 2012-02-05 ENCOUNTER — Ambulatory Visit: Payer: Medicare Other | Admitting: Cardiology

## 2012-02-06 ENCOUNTER — Ambulatory Visit (INDEPENDENT_AMBULATORY_CARE_PROVIDER_SITE_OTHER): Payer: Medicare Other | Admitting: *Deleted

## 2012-02-06 ENCOUNTER — Encounter: Payer: Self-pay | Admitting: Cardiology

## 2012-02-06 ENCOUNTER — Ambulatory Visit (INDEPENDENT_AMBULATORY_CARE_PROVIDER_SITE_OTHER): Payer: Medicare Other | Admitting: Cardiology

## 2012-02-06 VITALS — BP 124/64 | HR 62 | Ht 72.0 in | Wt 210.0 lb

## 2012-02-06 DIAGNOSIS — I495 Sick sinus syndrome: Secondary | ICD-10-CM

## 2012-02-06 DIAGNOSIS — I2699 Other pulmonary embolism without acute cor pulmonale: Secondary | ICD-10-CM

## 2012-02-06 DIAGNOSIS — Z7901 Long term (current) use of anticoagulants: Secondary | ICD-10-CM

## 2012-02-06 DIAGNOSIS — I5032 Chronic diastolic (congestive) heart failure: Secondary | ICD-10-CM

## 2012-02-06 DIAGNOSIS — I4892 Unspecified atrial flutter: Secondary | ICD-10-CM

## 2012-02-06 DIAGNOSIS — I4891 Unspecified atrial fibrillation: Secondary | ICD-10-CM

## 2012-02-06 DIAGNOSIS — R55 Syncope and collapse: Secondary | ICD-10-CM

## 2012-02-06 DIAGNOSIS — I509 Heart failure, unspecified: Secondary | ICD-10-CM

## 2012-02-06 LAB — POCT INR: INR: 2.2

## 2012-02-06 NOTE — Assessment & Plan Note (Signed)
There is no recurrent syncope. She's doing very well. Six-month followup.

## 2012-02-06 NOTE — Assessment & Plan Note (Signed)
Her rhythm was stable. She continues on flecainide. Pacemaker works well. No change in therapy.

## 2012-02-06 NOTE — Progress Notes (Signed)
HPI Patient is actually doing well. She's not had any chest pain or shortness of breath. There is been no syncope or presyncope. She has lost weight electively and she looks quite good.  Allergies  Allergen Reactions  . Iohexol     Current Outpatient Prescriptions  Medication Sig Dispense Refill  . anastrozole (ARIMIDEX) 1 MG tablet TAKE 1 TABLET EVERY DAY  30 tablet  4  . cyclobenzaprine (FLEXERIL) 10 MG tablet Take 10 mg by mouth as needed.       . flecainide (TAMBOCOR) 100 MG tablet Take 1 tablet (100 mg total) by mouth 2 (two) times daily.  60 tablet  6  . furosemide (LASIX) 40 MG tablet Take 1 tablet (40 mg total) by mouth daily.  30 tablet  11  . HYDROcodone-acetaminophen (NORCO) 5-325 MG per tablet Take 1 tablet by mouth every 4 (four) hours as needed.       . metFORMIN (GLUMETZA) 500 MG (MOD) 24 hr tablet Take 500 mg by mouth daily with breakfast.       . metoprolol succinate (TOPROL XL) 25 MG 24 hr tablet Take 1 tablet (25 mg total) by mouth daily.  30 tablet  11  . potassium chloride 20 MEQ/15ML (10%) solution Take 20 mEq by mouth daily.      Marland Kitchen pyridoxine (B-6) 100 MG tablet Take 200 mg by mouth daily.        . vitamin B-12 (CYANOCOBALAMIN) 500 MCG tablet Take 500 mcg by mouth daily.        Marland Kitchen warfarin (COUMADIN) 5 MG tablet TAKE AS DIRECTED BY COUMADIN CLINIC  45 tablet  3    History   Social History  . Marital Status: Widowed    Spouse Name: N/A    Number of Children: N/A  . Years of Education: N/A   Occupational History  . Not on file.   Social History Main Topics  . Smoking status: Former Smoker -- 4.0 packs/day    Quit date: 10/26/1998  . Smokeless tobacco: Never Used  . Alcohol Use: No  . Drug Use: No  . Sexually Active: Not on file   Other Topics Concern  . Not on file   Social History Narrative  . No narrative on file    Family History  Problem Relation Age of Onset  . Heart disease Mother   . COPD Father     Past Medical History  Diagnosis  Date  . Hypertension   . Diabetes mellitus   . Tachy-brady syndrome     s/p pacer 07/2009  . Atrial fibrillation     flecainide; coumadin  . Atrial flutter     November, 2010, flecainide, Coumadin  . Chronic diastolic heart failure     echo 10/10: mild LVH, EF 55-60%, mild AI, mild MR, mild to mod LAE, mild RVE, severe RAE, mod TR, PASP 53  . Shortness of breath     myoview 11/10: EF 65%, no ischemia  . Pulmonary HTN     53 mmHg echo, 2010, moderate TR, mild right ventricular enlargement  . DVT (deep venous thrombosis)     Pulmonary embolus  . Pulmonary embolus 2006    2006, with DVT  . Carotid artery disease     [id number]: RICA [id number], LICA 123456  . Thyroid nodule     non-neoplastic goiter  . Breast cancer   . Ejection fraction     EF 60%, echo, October, 2010  . Warfarin anticoagulation  Atrial fibrillation  . Pacemaker     November, 2010, Dr Allred  . S/P laminectomy     June 22, 2011  . Syncope     2010, treated with pacemaker  . Drug therapy     FLECAINIDE  . Mitral regurgitation     mild, echo, October, 2010  . Mediastinal lymphadenopathy   . Venous insufficiency     Chronic  . Hypokalemia     October, 2012, potassium dose increased    Past Surgical History  Procedure Date  . Breast surgery   . Back surgery   . Abdominal hysterectomy   . Back surgery     1973  . Ankle surgery     ROS Patient denies fever, chills, headache, sweats, rash, change in vision, change in hearing, chest pain, cough, nausea vomiting, urinary symptoms. All other systems are reviewed and are negative.  PHYSICAL EXAM Patient oriented to person time and place. Affect is normal. Head is atraumatic. There is no jugular venous distention. Lungs are clear. Respiratory effort is nonlabored. Cardiac exam reveals S1 and S2. There no clicks or significant murmurs. The abdomen is soft. There is no peripheral edema.  Filed Vitals:   02/06/12 0831  BP: 124/64  Pulse: 62  Height: 6'  (1.829 m)  Weight: 210 lb (95.255 kg)   EKG is done today and reviewed by me. She is atrial pacing. Her QRS shows no significant abnormality. There is no change.  ASSESSMENT & PLAN

## 2012-02-06 NOTE — Patient Instructions (Addendum)
Your physician recommends that you schedule a follow-up appointment in: Make an appt with the nurses in the device clinic to have your device checked in August.  Your physician wants you to follow-up in: 6 months.   You will receive a reminder letter in the mail two months in advance. If you don't receive a letter, please call our office to schedule the follow-up appointment.

## 2012-02-06 NOTE — Assessment & Plan Note (Signed)
Volume status is stable. No change in therapy. Labs when checked at the time of the last visit were quite good.

## 2012-02-13 ENCOUNTER — Ambulatory Visit
Admission: RE | Admit: 2012-02-13 | Discharge: 2012-02-13 | Disposition: A | Discharge status: Home / Self Care | Payer: Medicare Other | Source: Ambulatory Visit | Attending: Internal Medicine | Admitting: Internal Medicine

## 2012-02-13 DIAGNOSIS — Z1231 Encounter for screening mammogram for malignant neoplasm of breast: Secondary | ICD-10-CM

## 2012-02-17 ENCOUNTER — Telehealth: Payer: Self-pay | Admitting: Internal Medicine

## 2012-02-17 NOTE — Telephone Encounter (Signed)
Pt states she has had some trouble with fecal incontinence. Reports no GI history. Requesting to be seen. Pt scheduled to see Amy Esterwood PA 02/20/12@8 :30am. Pt aware of appt date and time.

## 2012-02-20 ENCOUNTER — Encounter: Payer: Self-pay | Admitting: Physician Assistant

## 2012-02-20 ENCOUNTER — Telehealth: Payer: Self-pay | Admitting: *Deleted

## 2012-02-20 ENCOUNTER — Ambulatory Visit (INDEPENDENT_AMBULATORY_CARE_PROVIDER_SITE_OTHER): Payer: Medicare Other | Admitting: Physician Assistant

## 2012-02-20 VITALS — BP 120/60 | HR 68 | Ht 72.0 in | Wt 215.0 lb

## 2012-02-20 DIAGNOSIS — R159 Full incontinence of feces: Secondary | ICD-10-CM

## 2012-02-20 MED ORDER — MOVIPREP 100 G PO SOLR: ORAL | Status: DC

## 2012-02-20 NOTE — Progress Notes (Signed)
Subjective:    Patient ID: [name] [name], female    DOB: 1931/03/15, 78 y.o.   MRN: [id number]  HPI Elmer Picker is very nice 78 year old female new to GI today. She comes in for evaluation of new onset of fecal incontinence. She has multiple medical problems including history of atrial fibrillation, tachybradycardia syndrome for which she is status post pacemaker placement. She is maintained on chronic Coumadin. She has history of chronic diastolic heart failure, pulmonary hypertension, diabetes, and history of breast cancer.  Patient has not had any prior colonoscopy. She relates initial onset of her problem with incontinence in the fall of 2012 after she had injured her back while writing on a running lawnmower. She says she was having low back pain and then also developed fecal incontinence which she had never had previously. She was seen by neurosurgery and was found to have multilevel lumbar disease with severe spinal stenosis at L2-L3 L3-L4 with almost complete occlusion of the spinal canal at L3-L4. It did not have a copy of her off note but she had surgery with Dr. Saintclair Halsted in September 2012 with resolution of her back pain and her incontinence.. She says this past Saturday she had an episode of fecal incontinence while sitting and eating breakfast. She says it has occurred at least a couple of more times since that she has absolutely no warning or sense of needing to have a bowel movement. She says she could only tell when she starts feeling something warm. He has not had any diarrhea melena or hematochezia has no complaints of rectal pain pressure etc. the is no complaints of abdominal pain her appetite has been okay she has lost weight since last fall but has been under extreme amount of pressure with family issues, murder of her sister center and says that her weight has actually leveled off.  Patient denies any problems with back pain lower crampy weakness numbness tingling etc. currently. She says  she had not had any other episodes of fecal incontinence since her back surgery until this past Saturday.    Review of Systems  Constitutional: Positive for unexpected weight change.  HENT: Negative.   Eyes: Negative.   Respiratory: Negative.   Cardiovascular: Negative.   Gastrointestinal: Negative.   Genitourinary: Negative.   Musculoskeletal: Negative.   Neurological: Negative.   Psychiatric/Behavioral: Negative.    Outpatient Prescriptions Prior to Visit  Medication Sig Dispense Refill  . anastrozole (ARIMIDEX) 1 MG tablet TAKE 1 TABLET EVERY DAY  30 tablet  4  . cyclobenzaprine (FLEXERIL) 10 MG tablet Take 10 mg by mouth as needed.       . flecainide (TAMBOCOR) 100 MG tablet Take 1 tablet (100 mg total) by mouth 2 (two) times daily.  60 tablet  6  . furosemide (LASIX) 40 MG tablet Take 1 tablet (40 mg total) by mouth daily.  30 tablet  11  . metFORMIN (GLUMETZA) 500 MG (MOD) 24 hr tablet Take 500 mg by mouth. Takes one and a half once daily      . metoprolol succinate (TOPROL XL) 25 MG 24 hr tablet Take 1 tablet (25 mg total) by mouth daily.  30 tablet  11  . potassium chloride 20 MEQ/15ML (10%) solution Take 20 mEq by mouth daily.      Marland Kitchen pyridoxine (B-6) 100 MG tablet Take 200 mg by mouth daily.        . vitamin B-12 (CYANOCOBALAMIN) 500 MCG tablet Take 500 mcg by mouth daily.        Marland Kitchen  warfarin (COUMADIN) 5 MG tablet TAKE AS DIRECTED BY COUMADIN CLINIC  45 tablet  3  . HYDROcodone-acetaminophen (NORCO) 5-325 MG per tablet Take 1 tablet by mouth every 4 (four) hours as needed.        Allergies  Allergen Reactions  . Iohexol    Patient Active Problem List  Diagnoses  . DM  . ALLERGIC RHINITIS  . MEDIASTINAL LYMPHADENOPATHY  . THYROID NODULE, HX OF  . Ulcer of toe  . Tachy-brady syndrome  . Atrial fibrillation  . Atrial flutter  . Chronic diastolic heart failure  . Shortness of breath  . Pulmonary HTN  . DVT (deep venous thrombosis)  . Pulmonary embolus  . Carotid  artery disease  . Breast cancer  . Ejection fraction  . Warfarin anticoagulation  . Pacemaker  . S/P laminectomy  . Syncope  . Drug therapy  . Mitral regurgitation  . Mediastinal lymphadenopathy  . Venous insufficiency  . Hypokalemia   History   Social History  . Marital Status: Widowed    Spouse Name: N/A    Number of Children: 3  . Years of Education: N/A   Occupational History  . Retired     Social History Main Topics  . Smoking status: Former Smoker -- 4.0 packs/day    Quit date: 10/26/1998  . Smokeless tobacco: Never Used  . Alcohol Use: No  . Drug Use: No  . Sexually Active: Not on file   Other Topics Concern  . Not on file   Social History Narrative   No caffeine        Objective:   Physical Exam well-developed elderly white female in no acute distress, pleasant blood pressure 120/60 pulse 68 height 6 foot weight 215. HEENT; nontraumatic normocephalic EOMI PERRLA sclera anicteric conjunctiva pink, Supple no JVD, Cardiovascular; regular rate and rhythm with S1-S2 pacemaker in the chest wall, pulmonary; clear bilaterally, Abdomen; soft nontender nondistended bowel sounds are active there is no palpable mass or hepatosplenomegaly, Rectal; exam Hemoccult-negative formed stool in the rectal vault markedly decreased sphincter tone., Extremities; no clubbing cyanosis or edema skin warm and dry, Psych; mood and affect normal and appropriate        Assessment & Plan:  #72 78 year old female with new onset of fecal incontinence over the past one week, and clinically with markedly decreased sphincter tone. I suspect that her incontinence is secondary to severe spinal stenosis with sacral plexus impingement Will rule out other anorectal and colonic pathology. #2 chronic anticoagulation #3 is post pacemaker placement for tachybradycardia syndrome #4 chronic diastolic heart failure #5 history of breast cancer #6 remote history of DVT/ PE  Plan; Will schedule for  colonoscopy with Dr. Delfin Edis at patient's request as Dr. Olevia Perches had previously taken care of her sister. Patient will come off of Coumadin 5 days prior to procedure and will obtain consent to stop her anticoagulation her cardiologist Dr. Ron Parker. Have also advised patient to make a an appointment with Dr. Saintclair Halsted or repeat neurosurgical evaluation and probable MRI of the lumbar spine

## 2012-02-20 NOTE — Telephone Encounter (Signed)
Routed to coumadin clinic

## 2012-02-20 NOTE — Patient Instructions (Signed)
We have scheduled the Colonoscopy with Dr. Delfin Edis for 2020-10-1911. Do not take the Coumadin starting on 02-23-2012.  You can resume it on 02-29-2012.  We are sending a letter to Dr. Ron Parker to inform him of this.  We have given you a prescription for the colonoscopy prep to take to your pharmacy.  Be sure to make your appointment to see Dr. Saintclair Halsted- Your Neurosurgeon.

## 2012-02-20 NOTE — Telephone Encounter (Signed)
  02/20/2012    RE: Sotiria Berteau DOB: 1930-11-16 MRN: [id number]   Dear Dr Dola Argyle,    We have scheduled the above patient for an endoscopic procedure. Our records show that she is on anticoagulation therapy.   I have advised the patient to stop her Coumadin for 5 days prior to the Colonoscopy, scheduled for Dec 02, 202013. She has been off her anticoagulation medication previously when having Endoscopic procedures.   Please advise [place] that you are in agreement with this.  Please fax back/ or route the completed form to my [place] at [phone].   Sincerely,   Amy Esterwood PA-C

## 2012-02-20 NOTE — Telephone Encounter (Signed)
It will be okay to hold the patient's Coumadin for her GI procedure.

## 2012-02-21 ENCOUNTER — Telehealth: Payer: Self-pay | Admitting: Cardiology

## 2012-02-21 NOTE — Telephone Encounter (Signed)
Patient called and told ok with Dr.Katz to hold coumadin for colonoscopy 02/28/12.Patient was told will sent message to coumadin clinic to make new appointment for INR check.

## 2012-02-21 NOTE — Telephone Encounter (Signed)
Pt advised to take last dose of Coumadin 5/25. She will resume coumadin when advised by GI. At that time she will take an additional 1/2 tablet for 2 days. Recheck INR scheduled for June 7th. Pt verbalizes understanding.

## 2012-02-21 NOTE — Progress Notes (Signed)
Agree with assessment and plans she should try and Imodium, and may be a candidate for the new colloid injection therapy likely that Dr. Deatra Ina has trained in.

## 2012-02-28 ENCOUNTER — Encounter: Payer: Self-pay | Admitting: *Deleted

## 2012-02-28 ENCOUNTER — Encounter: Payer: Self-pay | Admitting: Internal Medicine

## 2012-02-28 ENCOUNTER — Ambulatory Visit (AMBULATORY_SURGERY_CENTER): Payer: Medicare Other | Admitting: Internal Medicine

## 2012-02-28 VITALS — BP 167/92 | HR 69 | Temp 95.8°F | Resp 16 | Ht 72.0 in | Wt 215.0 lb

## 2012-02-28 DIAGNOSIS — R159 Full incontinence of feces: Secondary | ICD-10-CM

## 2012-02-28 DIAGNOSIS — D126 Benign neoplasm of colon, unspecified: Secondary | ICD-10-CM

## 2012-02-28 DIAGNOSIS — Z1211 Encounter for screening for malignant neoplasm of colon: Secondary | ICD-10-CM

## 2012-02-28 MED ORDER — SODIUM CHLORIDE 0.9 % IV SOLN: 500.0000 mL | INTRAVENOUS | Status: DC

## 2012-02-28 MED ORDER — DICYCLOMINE HCL 10 MG PO CAPS: 10.0000 mg | ORAL_CAPSULE | Freq: Two times a day (BID) | ORAL | Status: DC

## 2012-02-28 NOTE — Progress Notes (Signed)
Patient did not experience any of the following events: a burn prior to discharge; a fall within the facility; wrong site/side/patient/procedure/implant event; or a hospital transfer or hospital admission upon discharge from the facility. (G8907) Patient did not have preoperative order for IV antibiotic SSI prophylaxis. (G8918)  

## 2012-02-28 NOTE — Patient Instructions (Signed)
Findings:  polyps and Diverticulosis  Recommendations:  Follow up office visit - first available.                                     Bentyl two times a day.  Prescription given to pt.                                   Metamucil daily  YOU HAD AN ENDOSCOPIC PROCEDURE TODAY AT [facility]: Refer to the procedure report that was given to you for any specific questions about what was found during the examination.  If the procedure report does not answer your questions, please call your gastroenterologist to clarify.  If you requested that your care partner not be given the details of your procedure findings, then the procedure report has been included in a sealed envelope for you to review at your convenience later.  YOU SHOULD EXPECT: Some feelings of bloating in the abdomen. Passage of more gas than usual.  Walking can help get rid of the air that was put into your GI tract during the procedure and reduce the bloating. If you had a lower endoscopy (such as a colonoscopy or flexible sigmoidoscopy) you may notice spotting of blood in your stool or on the toilet paper. If you underwent a bowel prep for your procedure, then you may not have a normal bowel movement for a few days.  DIET: Your first meal following the procedure should be a light meal and then it is ok to progress to your normal diet.  A half-sandwich or bowl of soup is an example of a good first meal.  Heavy or fried foods are harder to digest and may make you feel nauseous or bloated.  Likewise meals heavy in dairy and vegetables can cause extra gas to form and this can also increase the bloating.  Drink plenty of fluids but you should avoid alcoholic beverages for 24 hours.  ACTIVITY: Your care partner should take you home directly after the procedure.  You should plan to take it easy, moving slowly for the rest of the day.  You can resume normal activity the day after the procedure however you should NOT DRIVE or use heavy  machinery for 24 hours (because of the sedation medicines used during the test).    SYMPTOMS TO REPORT IMMEDIATELY: A gastroenterologist can be reached at any hour.  During normal business hours, 8:30 AM to 5:00 PM Monday through Friday, call [phone].  After hours and on weekends, please call the GI answering service at [phone] who will take a message and have the physician on call contact you.   Following lower endoscopy (colonoscopy or flexible sigmoidoscopy):  Excessive amounts of blood in the stool  Significant tenderness or worsening of abdominal pains  Swelling of the abdomen that is new, acute  Fever of 100F or higher  Following upper endoscopy (EGD)  Vomiting of blood or coffee ground material  New chest pain or pain under the shoulder blades  Painful or persistently difficult swallowing  New shortness of breath  Fever of 100F or higher  Black, tarry-looking stools  FOLLOW UP: If any biopsies were taken you will be contacted by phone or by letter within the next 1-3 weeks.  Call your gastroenterologist if you have not heard about the  biopsies in 3 weeks.  Our staff will call the home number listed on your records the next business day following your procedure to check on you and address any questions or concerns that you may have at that time regarding the information given to you following your procedure. This is a courtesy call and so if there is no answer at the home number and we have not heard from you through the emergency physician on call, we will assume that you have returned to your regular daily activities without incident.  SIGNATURES/CONFIDENTIALITY: You and/or your care partner have signed paperwork which will be entered into your electronic medical record.  These signatures attest to the fact that that the information above on your After Visit Summary has been reviewed and is understood.  Full responsibility of the confidentiality of this discharge  information lies with you and/or your care-partner.   Please follow all discharge instructions given to you by the recovery room nurse. If you have any questions or problems after discharge please call one of the numbers listed above. You will receive a phone call in the am to see how you are doing and answer any questions you may have. Thank you for choosing [facility] for your health care needs.

## 2012-02-28 NOTE — Op Note (Signed)
[facility] [address]. [place], [place]  [id number]  COLONOSCOPY PROCEDURE REPORT  PATIENT:  [name] [name], [name] [name]  MR#:  [id number] BIRTHDATE:  11-25-1930, 81 yrs. old  GENDER:  female ENDOSCOPIST:  Lowella Bandy. Olevia Perches, MD REF. BY:  Jeanann Lewandowsky, M.D. PROCEDURE DATE:  2020-01-212 PROCEDURE:  Colonoscopy with biopsy and snare polypectomy ASA CLASS:  Class II INDICATIONS:  fecal incontinence, colorectal cancer screening, average risk MEDICATIONS:   MAC sedation, administered by CRNA, propofol (Diprivan) 350 mg  DESCRIPTION OF PROCEDURE:   After the risks and benefits and of the procedure were explained, informed consent was obtained. Digital rectal exam was performed and revealed decreased sphincter tone.   The LB [id number] endoscope was introduced through the anus and advanced to the cecum, which was identified by both the appendix and ileocecal valve.  The quality of the prep was good, using MoviPrep.  The instrument was then slowly withdrawn as the colon was fully examined. <<PROCEDUREIMAGES>>  FINDINGS:  Three polyps were found. 3 mm cecal polyp, 8 mm sigmoid polyp at 30cm, 4 mm sessile polyp at 15 cm The polyp was removed using cold biopsy forceps. Polyp was snared without cautery. Retrieval was successful. snare polyp Polyp was snared, then cauterized with monopolar cautery. Retrieval was successful (see image6, image7, image8, and image9). snare polyp  Moderate diverticulosis was found (see image10, image9, image8, and image2). marked diverticulosis with spam, tortuosity, narrow lumen This was otherwise a normal examination of the colon (see image13, image12, image11, image4, and image5). markedly decreased rectal sphincter tone   Retroflexed views in the rectum revealed no abnormalities.    The scope was then withdrawn from the patient and the procedure completed.  COMPLICATIONS:  None ENDOSCOPIC IMPRESSION: 1) Three polyps 2) Moderate diverticulosis 3)  Otherwise normal examination laxed rectal sphincter, almost no sqeeze RECOMMENDATIONS: 1) Await pathology results add Bentyl 10 mg po bid add Metamucil 1 tsp dfaily follow up OV first available  REPEAT EXAM:  In 0 year(s) for.  ______________________________ Lowella Bandy. Olevia Perches, MD  CC:  n. eSIGNED:   Lowella Bandy. Lewanna Petrak at 2020-01-212 12:43 PM  Doristine Church, [id number]

## 2012-03-02 ENCOUNTER — Telehealth: Payer: Self-pay | Admitting: *Deleted

## 2012-03-02 NOTE — Telephone Encounter (Signed)
  Follow up Call-  Call back number 11/21/202013  Post procedure Call Back phone  # [phone]  Permission to leave phone message Yes     Patient questions:  Do you have a fever, pain , or abdominal swelling? no Pain Score  0 *  Have you tolerated food without any problems? yes  Have you been able to return to your normal activities? yes  Do you have any questions about your discharge instructions: Diet   no Medications  no Follow up visit  no  Do you have questions or concerns about your Care? no  Actions: * If pain score is 4 or above: No action needed, pain <4.

## 2012-03-03 ENCOUNTER — Encounter: Payer: Self-pay | Admitting: Internal Medicine

## 2012-03-06 ENCOUNTER — Ambulatory Visit (INDEPENDENT_AMBULATORY_CARE_PROVIDER_SITE_OTHER): Payer: Medicare Other | Admitting: *Deleted

## 2012-03-06 DIAGNOSIS — I4891 Unspecified atrial fibrillation: Secondary | ICD-10-CM

## 2012-03-06 DIAGNOSIS — I2699 Other pulmonary embolism without acute cor pulmonale: Secondary | ICD-10-CM

## 2012-03-06 DIAGNOSIS — Z7901 Long term (current) use of anticoagulants: Secondary | ICD-10-CM

## 2012-03-06 LAB — POCT INR
INR: 1.7
INR: 1.7

## 2012-03-09 ENCOUNTER — Encounter: Payer: Self-pay | Admitting: *Deleted

## 2012-03-10 ENCOUNTER — Encounter: Payer: Self-pay | Admitting: Internal Medicine

## 2012-03-10 ENCOUNTER — Ambulatory Visit (INDEPENDENT_AMBULATORY_CARE_PROVIDER_SITE_OTHER): Payer: Medicare Other | Admitting: Internal Medicine

## 2012-03-10 VITALS — BP 124/60 | HR 72 | Ht 72.0 in | Wt 216.0 lb

## 2012-03-10 DIAGNOSIS — K589 Irritable bowel syndrome without diarrhea: Secondary | ICD-10-CM

## 2012-03-10 NOTE — Progress Notes (Signed)
[name] [name] 1931/08/20 MRN [id number]   History of Present Illness:  This is a 78 year old African American female with fecal incontinence and urgency following a back injury; this is a followup appointment after a colonoscopy on 2020-02-2112 which showed 3 polyps which were tubular adenomas. She had moderately severe diverticulosis with spasm and tortuous colon with a narrow lumen. Her rectal sphincter tone was decreased. She was started on Bentyl 10 mg a day and Metamucil 1 teaspoon daily. She has had marked improvement in her symptoms. Currently, she has no incontinence or urgency. Her bowel habits have been regular. She is very satisfied with the results. Other medical problems include atrial fibrillation, Sinus syndrome. Patient is status post permanent transvenous pacemaker placement. Patient has been on Coumadin and has a chronic diastolic heart failure and pulmonary hypertension. She has a history of breast cancer and diabetes.   Past Medical History  Diagnosis Date  . Hypertension   . Diabetes mellitus   . Tachy-brady syndrome     s/p pacer 07/2009  . Atrial fibrillation     flecainide; coumadin  . Atrial flutter     November, 2010, flecainide, Coumadin  . Chronic diastolic heart failure     echo 10/10: mild LVH, EF 55-60%, mild AI, mild MR, mild to mod LAE, mild RVE, severe RAE, mod TR, PASP 53  . Shortness of breath     myoview 11/10: EF 65%, no ischemia  . Pulmonary HTN     53 mmHg echo, 2010, moderate TR, mild right ventricular enlargement  . DVT (deep venous thrombosis)     Pulmonary embolus  . Pulmonary embolus 2006    2006, with DVT  . Carotid artery disease     [id number]: RICA [id number], LICA [id number]  . Thyroid nodule     non-neoplastic goiter  . Breast cancer   . Ejection fraction     EF 60%, echo, October, 2010  . Warfarin anticoagulation     Atrial fibrillation  . Pacemaker     November, 2010, Dr Allred  . S/P laminectomy     June 22, 2011  . Syncope    2010, treated with pacemaker  . Drug therapy     FLECAINIDE  . Mitral regurgitation     mild, echo, October, 2010  . Mediastinal lymphadenopathy   . Venous insufficiency     Chronic  . Hypokalemia     October, 2012, potassium dose increased  . Hiatal hernia 2006    EGD  . Acute gastric ulcer without mention of hemorrhage, perforation, or obstruction 2006    EGD  . Esophagitis, unspecified 2006    EGD  . Allergy     seasonal  . Diverticulosis   . Hx of adenomatous colonic polyps   . Fecal incontinence    Past Surgical History  Procedure Date  . Breast surgery     Left  . Abdominal hysterectomy   . Back surgery     1973, 2012   . Ankle surgery     reports that she quit smoking about 13 years ago. She has never used smokeless tobacco. She reports that she does not drink alcohol or use illicit drugs. family history includes COPD in her father and Heart disease in her mother.  There is no history of Colon cancer. Allergies  Allergen Reactions  . Iohexol         Review of Systems: Negative for rectal bleeding abdominal pain urgency or incontinence  The remainder of the 10 point  ROS is negative except as outlined in H&P   Physical Exam: General appearance  Well developed, in no distress. Psychological normal mood and affect.  Assessment and Plan:  Problem #1 Complete resolution of fecal urgency and incontinence which was likely related to an irritable bowel syndrome. She has responded to increased fiber; metamucil 1 teaspoon daily and an antispasmodic, Bentyl 10 mg daily. This medication may be increased to 2-3 times a day should her symptoms return. Because of the history of tubular adenomas, she will be recalled for a colonoscopy in 5 years.   03/10/2012 Delfin Edis

## 2012-03-10 NOTE — Patient Instructions (Signed)
Follow up with Dr Olevia Perches as needed. CC: Dr Jeanann Lewandowsky

## 2012-03-18 ENCOUNTER — Encounter: Payer: Self-pay | Admitting: Cardiology

## 2012-03-20 ENCOUNTER — Ambulatory Visit (INDEPENDENT_AMBULATORY_CARE_PROVIDER_SITE_OTHER): Payer: Medicare Other | Admitting: *Deleted

## 2012-03-20 DIAGNOSIS — I2699 Other pulmonary embolism without acute cor pulmonale: Secondary | ICD-10-CM

## 2012-03-20 DIAGNOSIS — I4891 Unspecified atrial fibrillation: Secondary | ICD-10-CM

## 2012-03-20 DIAGNOSIS — Z7901 Long term (current) use of anticoagulants: Secondary | ICD-10-CM

## 2012-03-20 LAB — POCT INR: INR: 2.3

## 2012-04-11 ENCOUNTER — Other Ambulatory Visit: Payer: Self-pay | Admitting: Cardiology

## 2012-04-17 ENCOUNTER — Ambulatory Visit (INDEPENDENT_AMBULATORY_CARE_PROVIDER_SITE_OTHER): Payer: Medicare Other

## 2012-04-17 DIAGNOSIS — Z7901 Long term (current) use of anticoagulants: Secondary | ICD-10-CM

## 2012-04-17 DIAGNOSIS — I2699 Other pulmonary embolism without acute cor pulmonale: Secondary | ICD-10-CM

## 2012-04-17 DIAGNOSIS — I4891 Unspecified atrial fibrillation: Secondary | ICD-10-CM

## 2012-05-15 ENCOUNTER — Ambulatory Visit (INDEPENDENT_AMBULATORY_CARE_PROVIDER_SITE_OTHER): Payer: Medicare Other | Admitting: *Deleted

## 2012-05-15 DIAGNOSIS — Z7901 Long term (current) use of anticoagulants: Secondary | ICD-10-CM

## 2012-05-15 DIAGNOSIS — I4891 Unspecified atrial fibrillation: Secondary | ICD-10-CM

## 2012-05-15 DIAGNOSIS — I2699 Other pulmonary embolism without acute cor pulmonale: Secondary | ICD-10-CM

## 2012-05-15 LAB — POCT INR: INR: 2.2

## 2012-05-20 ENCOUNTER — Encounter: Payer: Self-pay | Admitting: *Deleted

## 2012-05-25 ENCOUNTER — Ambulatory Visit (INDEPENDENT_AMBULATORY_CARE_PROVIDER_SITE_OTHER): Payer: Medicare Other | Admitting: *Deleted

## 2012-05-25 ENCOUNTER — Encounter: Payer: Self-pay | Admitting: Internal Medicine

## 2012-05-25 DIAGNOSIS — I495 Sick sinus syndrome: Secondary | ICD-10-CM

## 2012-05-25 LAB — PACEMAKER DEVICE OBSERVATION
AL IMPEDENCE PM: 486 Ohm
AL THRESHOLD: 0.75 V
ATRIAL PACING PM: 86
BATTERY VOLTAGE: 2.79 V
RV LEAD AMPLITUDE: 8 mv
RV LEAD IMPEDENCE PM: 456 Ohm

## 2012-05-25 NOTE — Progress Notes (Signed)
PPM check 

## 2012-05-26 ENCOUNTER — Other Ambulatory Visit: Payer: Self-pay | Admitting: *Deleted

## 2012-05-26 DIAGNOSIS — C50419 Malignant neoplasm of upper-outer quadrant of unspecified female breast: Secondary | ICD-10-CM

## 2012-05-26 MED ORDER — ANASTROZOLE 1 MG PO TABS: 1.0000 mg | ORAL_TABLET | Freq: Every day | ORAL | Status: DC

## 2012-05-29 ENCOUNTER — Other Ambulatory Visit: Payer: Self-pay | Admitting: *Deleted

## 2012-05-29 DIAGNOSIS — C50419 Malignant neoplasm of upper-outer quadrant of unspecified female breast: Secondary | ICD-10-CM

## 2012-05-29 MED ORDER — ANASTROZOLE 1 MG PO TABS: 1.0000 mg | ORAL_TABLET | Freq: Every day | ORAL | Status: DC

## 2012-05-30 ENCOUNTER — Telehealth: Payer: Self-pay | Admitting: Oncology

## 2012-05-30 NOTE — Telephone Encounter (Signed)
lmonvm adviisng the pt of her r/s appts for sept.

## 2012-06-12 ENCOUNTER — Ambulatory Visit (INDEPENDENT_AMBULATORY_CARE_PROVIDER_SITE_OTHER): Payer: Medicare Other | Admitting: *Deleted

## 2012-06-12 DIAGNOSIS — Z7901 Long term (current) use of anticoagulants: Secondary | ICD-10-CM

## 2012-06-12 DIAGNOSIS — I2699 Other pulmonary embolism without acute cor pulmonale: Secondary | ICD-10-CM

## 2012-06-12 DIAGNOSIS — I4891 Unspecified atrial fibrillation: Secondary | ICD-10-CM

## 2012-06-12 LAB — POCT INR: INR: 3.1

## 2012-06-22 ENCOUNTER — Telehealth: Payer: Self-pay | Admitting: Oncology

## 2012-06-22 ENCOUNTER — Ambulatory Visit (HOSPITAL_BASED_OUTPATIENT_CLINIC_OR_DEPARTMENT_OTHER): Payer: Medicare Other | Admitting: Oncology

## 2012-06-22 ENCOUNTER — Other Ambulatory Visit (HOSPITAL_BASED_OUTPATIENT_CLINIC_OR_DEPARTMENT_OTHER): Payer: Medicare Other

## 2012-06-22 ENCOUNTER — Encounter: Payer: Self-pay | Admitting: Oncology

## 2012-06-22 VITALS — BP 162/95 | HR 87 | Temp 96.9°F | Resp 20 | Ht 72.0 in | Wt 223.7 lb

## 2012-06-22 DIAGNOSIS — Z86718 Personal history of other venous thrombosis and embolism: Secondary | ICD-10-CM

## 2012-06-22 DIAGNOSIS — C50919 Malignant neoplasm of unspecified site of unspecified female breast: Secondary | ICD-10-CM

## 2012-06-22 DIAGNOSIS — D649 Anemia, unspecified: Secondary | ICD-10-CM

## 2012-06-22 DIAGNOSIS — M8580 Other specified disorders of bone density and structure, unspecified site: Secondary | ICD-10-CM

## 2012-06-22 DIAGNOSIS — C50419 Malignant neoplasm of upper-outer quadrant of unspecified female breast: Secondary | ICD-10-CM

## 2012-06-22 DIAGNOSIS — M899 Disorder of bone, unspecified: Secondary | ICD-10-CM

## 2012-06-22 LAB — COMPREHENSIVE METABOLIC PANEL (CC13)
Albumin: 3.9 g/dL (ref 3.5–5.0)
BUN: 22 mg/dL (ref 7.0–26.0)
CO2: 25 mEq/L (ref 22–29)
Calcium: 10.1 mg/dL (ref 8.4–10.4)
Chloride: 102 mEq/L (ref 98–107)
Creatinine: 1.5 mg/dL — ABNORMAL HIGH (ref 0.6–1.1)
Glucose: 87 mg/dl (ref 70–99)
Potassium: 4.4 mEq/L (ref 3.5–5.1)

## 2012-06-22 LAB — CBC WITH DIFFERENTIAL/PLATELET
Basophils Absolute: 0.1 10*3/uL (ref 0.0–0.1)
Eosinophils Absolute: 0.1 10*3/uL (ref 0.0–0.5)
HCT: 38.7 % (ref 34.8–46.6)
HGB: 13.1 g/dL (ref 11.6–15.9)
NEUT#: 1.9 10*3/uL (ref 1.5–6.5)
NEUT%: 52.4 % (ref 38.4–76.8)
RDW: 15.1 % — ABNORMAL HIGH (ref 11.2–14.5)
lymph#: 1 10*3/uL (ref 0.9–3.3)

## 2012-06-22 NOTE — Progress Notes (Signed)
[facility]  [phone] [phone]   OFFICE PROGRESS NOTE   Cc:  Foye Spurling, MD  DIAGNOSIS AND PAST THERAPY: : History of a 3.5 cm, grade 2, invasive ductal carcinoma with intermediate grade ductal in situ with calcification with positive lymphovascular and perineural invasion; status post left breast modified simple mastectomy with sentinel lymph node biopsy with one 3 lymph nodes positive for invasive ductal carcinoma. The margin was negative with the invasive component and 1.5 cm and the in situ distance closest margins at 1 cm; ER 100%; PR 62%; Ki-67 15%; HER-2/neu by CISH was negative with ratio of 1.4.  CURRENT THERAPY: She started on adjuvant anastrozole on 03/04/2010   INTERVAL HISTORY: [name] [name] 78 y.o. female returns for regular follow up by herself.  She had screening colonoscopy with Dr. Olevia Perches in 01/2012 which showed 3 polyps which were tubular adenomas. She had moderately severe diverticulosis.  She is due to have follow up colonoscopy in 01/2017. With respect to her history of breast cancer; she is doing well. She is taking anastrozole without major problem. She denies breast abnormality, myalgia, arthralgia, severe bone pain, weight loss, jaundice, bleeding symptoms. She has good performance status. She still lives by herself and is independent of all activities of daily living. The rest of the 14 point review system was negative.   Past Medical History  Diagnosis Date  . Hypertension   . Diabetes mellitus   . Tachy-brady syndrome     s/p pacer 07/2009  . Atrial fibrillation     flecainide; coumadin  . Atrial flutter     November, 2010, flecainide, Coumadin  . Chronic diastolic heart failure     echo 10/10: mild LVH, EF 55-60%, mild AI, mild MR, mild to mod LAE, mild RVE, severe RAE, mod TR, PASP 53  . Shortness of breath     myoview 11/10: EF 65%, no ischemia  . Pulmonary HTN     53 mmHg echo, 2010, moderate TR, mild  right ventricular enlargement  . DVT (deep venous thrombosis)     Pulmonary embolus  . Pulmonary embolus 2006    2006, with DVT  . Carotid artery disease     [id number]: RICA [id number], LICA 123456  . Thyroid nodule     non-neoplastic goiter  . Breast cancer   . Ejection fraction     EF 60%, echo, October, 2010  . Warfarin anticoagulation     Atrial fibrillation  . Pacemaker     November, 2010, Dr Allred  . S/P laminectomy     June 22, 2011  . Syncope     2010, treated with pacemaker  . Drug therapy     FLECAINIDE  . Mitral regurgitation     mild, echo, October, 2010  . Mediastinal lymphadenopathy   . Venous insufficiency     Chronic  . Hypokalemia     October, 2012, potassium dose increased  . Hiatal hernia 2006    EGD  . Acute gastric ulcer without mention of hemorrhage, perforation, or obstruction 2006    EGD  . Esophagitis, unspecified 2006    EGD  . Allergy     seasonal  . Diverticulosis   . Hx of adenomatous colonic polyps   . Fecal incontinence   . Breast cancer     Past Surgical History  Procedure Date  . Breast surgery     Left  . Abdominal hysterectomy   . Back surgery     1973,  2012   . Ankle surgery     Current Outpatient Prescriptions  Medication Sig Dispense Refill  . anastrozole (ARIMIDEX) 1 MG tablet Take 1 tablet (1 mg total) by mouth daily.  30 tablet  0  . bethanechol (URECHOLINE) 25 MG tablet Take 25 mg by mouth daily.      . cyclobenzaprine (FLEXERIL) 10 MG tablet Take 10 mg by mouth as needed.       . dicyclomine (BENTYL) 10 MG capsule Take 1 capsule (10 mg total) by mouth 2 (two) times daily with a meal.  60 capsule  3  . flecainide (TAMBOCOR) 100 MG tablet Take 1 tablet (100 mg total) by mouth 2 (two) times daily.  60 tablet  6  . furosemide (LASIX) 40 MG tablet Take 1 tablet (40 mg total) by mouth daily.  30 tablet  11  . metFORMIN (GLUMETZA) 500 MG (MOD) 24 hr tablet Take 500 mg by mouth daily with breakfast.       . metoprolol  succinate (TOPROL XL) 25 MG 24 hr tablet Take 1 tablet (25 mg total) by mouth daily.  30 tablet  11  . potassium chloride 20 MEQ/15ML (10%) solution Take 20 mEq by mouth daily.      Marland Kitchen pyridoxine (B-6) 100 MG tablet Take 200 mg by mouth daily.        . vitamin B-12 (CYANOCOBALAMIN) 500 MCG tablet Take 500 mcg by mouth daily.        Marland Kitchen warfarin (COUMADIN) 5 MG tablet TAKE AS DIRECTED BY COUMADIN CLINIC  30 tablet  3    ALLERGIES:  is allergic to iohexol.  REVIEW OF SYSTEMS:  The rest of the 14-point review of system was negative.   Filed Vitals:   06/22/12 1111  BP: 162/95  Pulse: 87  Temp: 96.9 F (36.1 C)  Resp: 20   Wt Readings from Last 3 Encounters:  06/22/12 223 lb 11.2 oz (101.47 kg)  03/10/12 216 lb (97.977 kg)  02/28/12 215 lb (97.523 kg)   ECOG Performance status: 0  PHYSICAL EXAMINATION:   General:  well-nourished woman, in no acute distress.  Eyes:  no scleral icterus.  ENT:  There were no oropharyngeal lesions.  Neck was without thyromegaly.  Lymphatics:  Negative cervical, supraclavicular or axillary adenopathy.  Respiratory: lungs were clear bilaterally without wheezing or crackles.  Cardiovascular:  Regular rate and rhythm, S1/S2, without murmur, rub or gallop.  There was no pedal edema.  GI:  abdomen was soft, flat, nontender, nondistended, without organomegaly.  Muscoloskeletal:  no spinal tenderness of palpation of vertebral spine.  Skin exam was without echymosis, petichae.  Neuro exam was nonfocal.  Patient was able to get on and off exam table without assistance.  Gait was normal.  Patient was alerted and oriented.  Attention was good.   Language was appropriate.  Mood was normal without depression.  Speech was not pressured.  Thought content was not tangential.  Her right breast showed no abnormal breast mass, erythema, skin thickening, nipple discharge, nipple invasion. Her left mastectomy scar was well-healed without any abnormal nodule.    LABORATORY/RADIOLOGY  DATA:  Lab Results  Component Value Date   WBC 3.6* 06/22/2012   HGB 13.1 06/22/2012   HCT 38.7 06/22/2012   PLT 288 06/22/2012   GLUCOSE 87 06/22/2012   ALKPHOS 98 06/22/2012   ALT 16 06/22/2012   AST 23 06/22/2012   NA 138 06/22/2012   K 4.4 06/22/2012   CL 102 06/22/2012  CREATININE 1.5* 06/22/2012   BUN 22.0 06/22/2012   CO2 25 06/22/2012   INR 3.1 06/12/2012     ASSESSMENT AND PLAN:   1. History of breast cancer: There is no evidence of recurrence disease or metastatic disease on clinical history, physical exam, lab test, mammogram. I advised her to continue with anastrozole until at least May 2016. She claims that she is compliant with anastrozole and calcium and vitamin D. She has not had a routine screening bone density scan since June of 2011; I went ahead and ordered repeat bone density scan with the next one month with the breast Center. Next routine screening mammogram for the right breast (not diagnostic) is due in may of 2014 which needs to be scheduled when she is here next time. 2. Anemia: Unclear etiology: Her hemoglobin has now normalized. She does have history of diverticulosis which may have  slow oozing. Therefore I advised observation. In the future if she has recurrent anemia and may consider  oral iron. 3. Osteopenia: She is on calcium vitamin D. I ordered a bone density scan with the next 4 weeks. 4. History of deep vein thrombosis: She is on Coumadin per PCP. 5. Diabetes mellitus type 2 with complication: She is on metformin per PCP. 6. Followup: In about 9 months.   The length of time of the face-to-face encounter was 15 minutes. More than 50% of time was spent counseling and coordination of care.

## 2012-06-22 NOTE — Patient Instructions (Addendum)
1.  History of breast cancer:  Continue with Anastrazole until 01/2015. 2.  Anemia:  Continue oral iron.  If you never had colonoscopy, consider follow up with GI doctor to see if colonoscopy is indicated to rule out colon polyps. 3.  Follow up:  In about 9 months.  Next mammogram in 01/2013.

## 2012-06-22 NOTE — Telephone Encounter (Signed)
appts made and printed for pt aom °

## 2012-06-23 ENCOUNTER — Telehealth: Payer: Self-pay | Admitting: Oncology

## 2012-06-23 NOTE — Telephone Encounter (Signed)
s.w. pt and advised about 9.27.13 appt....sed   By Ilean China

## 2012-06-24 ENCOUNTER — Encounter: Payer: Self-pay | Admitting: Oncology

## 2012-06-24 NOTE — Progress Notes (Signed)
Patient's discount expired 06/27/12. Mailed financial assistance application today.

## 2012-06-26 ENCOUNTER — Other Ambulatory Visit: Payer: Medicare Other

## 2012-06-26 ENCOUNTER — Other Ambulatory Visit: Payer: Self-pay | Admitting: Oncology

## 2012-07-02 ENCOUNTER — Other Ambulatory Visit: Payer: Self-pay | Admitting: Oncology

## 2012-07-02 DIAGNOSIS — M8580 Other specified disorders of bone density and structure, unspecified site: Secondary | ICD-10-CM

## 2012-07-02 DIAGNOSIS — C50919 Malignant neoplasm of unspecified site of unspecified female breast: Secondary | ICD-10-CM

## 2012-07-02 MED ORDER — ALENDRONATE SODIUM 35 MG PO TABS: 35.0000 mg | ORAL_TABLET | ORAL | Status: DC

## 2012-07-10 ENCOUNTER — Encounter: Payer: Self-pay | Admitting: Oncology

## 2012-07-10 ENCOUNTER — Ambulatory Visit (INDEPENDENT_AMBULATORY_CARE_PROVIDER_SITE_OTHER): Payer: Medicare Other | Admitting: *Deleted

## 2012-07-10 DIAGNOSIS — I4891 Unspecified atrial fibrillation: Secondary | ICD-10-CM

## 2012-07-10 DIAGNOSIS — I2699 Other pulmonary embolism without acute cor pulmonale: Secondary | ICD-10-CM

## 2012-07-10 DIAGNOSIS — Z7901 Long term (current) use of anticoagulants: Secondary | ICD-10-CM

## 2012-07-10 LAB — POCT INR: INR: 2.9

## 2012-07-10 NOTE — Progress Notes (Signed)
Lady dropped off financial application for patient. Note attached for Darlena to email the daughter(agent). Processed the application  [id number] indigent 07/10/12-01/08/13. Can't send out letter at this time(not available) will give to Darlena to get letter for [place] on Monday 07/13/12.

## 2012-07-13 ENCOUNTER — Encounter: Payer: Self-pay | Admitting: Oncology

## 2012-08-03 ENCOUNTER — Other Ambulatory Visit: Payer: Self-pay | Admitting: *Deleted

## 2012-08-03 DIAGNOSIS — C50419 Malignant neoplasm of upper-outer quadrant of unspecified female breast: Secondary | ICD-10-CM

## 2012-08-03 MED ORDER — ANASTROZOLE 1 MG PO TABS: 1.0000 mg | ORAL_TABLET | Freq: Every day | ORAL | Status: DC

## 2012-08-06 ENCOUNTER — Ambulatory Visit (INDEPENDENT_AMBULATORY_CARE_PROVIDER_SITE_OTHER): Payer: Medicare Other | Admitting: *Deleted

## 2012-08-06 DIAGNOSIS — Z7901 Long term (current) use of anticoagulants: Secondary | ICD-10-CM

## 2012-08-06 DIAGNOSIS — I2699 Other pulmonary embolism without acute cor pulmonale: Secondary | ICD-10-CM

## 2012-08-06 DIAGNOSIS — I4891 Unspecified atrial fibrillation: Secondary | ICD-10-CM

## 2012-08-20 ENCOUNTER — Ambulatory Visit (INDEPENDENT_AMBULATORY_CARE_PROVIDER_SITE_OTHER): Payer: Medicare Other | Admitting: Cardiology

## 2012-08-20 ENCOUNTER — Encounter: Payer: Self-pay | Admitting: Cardiology

## 2012-08-20 VITALS — BP 120/80 | HR 68 | Ht 72.0 in | Wt 232.0 lb

## 2012-08-20 DIAGNOSIS — I5032 Chronic diastolic (congestive) heart failure: Secondary | ICD-10-CM

## 2012-08-20 DIAGNOSIS — I4891 Unspecified atrial fibrillation: Secondary | ICD-10-CM

## 2012-08-20 DIAGNOSIS — R55 Syncope and collapse: Secondary | ICD-10-CM

## 2012-08-20 NOTE — Assessment & Plan Note (Signed)
Patient's volumes status is well controlled. No change in therapy.

## 2012-08-20 NOTE — Progress Notes (Signed)
HPI  Patient is seen today to followup atrial fibrillation, flecainide therapy, Coumadin therapy, carotid artery disease. She is actually doing well. She had injured herself and had bleeding from her foot. This was managed carefully and her Coumadin status is stable. She's not having any syncope or presyncope. She's not having any significant palpitations.  Allergies  Allergen Reactions  . Iohexol     Current Outpatient Prescriptions  Medication Sig Dispense Refill  . alendronate (FOSAMAX) 35 MG tablet Take 1 tablet (35 mg total) by mouth every 7 (seven) days. Take with a full glass of water on an empty stomach.  12 tablet  4  . anastrozole (ARIMIDEX) 1 MG tablet Take 1 tablet (1 mg total) by mouth daily.  90 tablet  3  . bethanechol (URECHOLINE) 25 MG tablet Take 25 mg by mouth daily.      . cyclobenzaprine (FLEXERIL) 10 MG tablet Take 10 mg by mouth as needed.       . dicyclomine (BENTYL) 10 MG capsule Take 1 capsule (10 mg total) by mouth 2 (two) times daily with a meal.  60 capsule  3  . flecainide (TAMBOCOR) 100 MG tablet Take 1 tablet (100 mg total) by mouth 2 (two) times daily.  60 tablet  6  . furosemide (LASIX) 40 MG tablet Take 40 mg by mouth daily.      . metFORMIN (GLUMETZA) 500 MG (MOD) 24 hr tablet Take 500 mg by mouth daily with breakfast.       . metoprolol succinate (TOPROL XL) 25 MG 24 hr tablet Take 1 tablet (25 mg total) by mouth daily.  30 tablet  11  . potassium chloride 20 MEQ/15ML (10%) solution Take 20 mEq by mouth daily.      Marland Kitchen pyridoxine (B-6) 100 MG tablet Take 200 mg by mouth daily.        . vitamin B-12 (CYANOCOBALAMIN) 500 MCG tablet Take 500 mcg by mouth daily.        Marland Kitchen warfarin (COUMADIN) 5 MG tablet TAKE AS DIRECTED BY COUMADIN CLINIC  30 tablet  3  . [DISCONTINUED] furosemide (LASIX) 40 MG tablet Take 1 tablet (40 mg total) by mouth daily.  30 tablet  11    History   Social History  . Marital Status: Widowed    Spouse Name: N/A    Number of  Children: 3  . Years of Education: N/A   Occupational History  . Retired     Social History Main Topics  . Smoking status: Former Smoker -- 4.0 packs/day    Quit date: 10/26/1998  . Smokeless tobacco: Never Used  . Alcohol Use: No  . Drug Use: No  . Sexually Active: Not on file   Other Topics Concern  . Not on file   Social History Narrative   No caffeine     Family History  Problem Relation Age of Onset  . Heart disease Mother   . COPD Father   . Colon cancer Neg Hx     Past Medical History  Diagnosis Date  . Hypertension   . Diabetes mellitus   . Tachy-brady syndrome     s/p pacer 07/2009  . Atrial fibrillation     flecainide; coumadin  . Atrial flutter     November, 2010, flecainide, Coumadin  . Chronic diastolic heart failure     echo 10/10: mild LVH, EF 55-60%, mild AI, mild MR, mild to mod LAE, mild RVE, severe RAE, mod TR, PASP 53  .  Shortness of breath     myoview 11/10: EF 65%, no ischemia  . Pulmonary HTN     53 mmHg echo, 2010, moderate TR, mild right ventricular enlargement  . DVT (deep venous thrombosis)     Pulmonary embolus  . Pulmonary embolus 2006    2006, with DVT  . Carotid artery disease     [id number]: RICA [id number], LICA 123456  . Thyroid nodule     non-neoplastic goiter  . Breast cancer   . Ejection fraction     EF 60%, echo, October, 2010  . Warfarin anticoagulation     Atrial fibrillation  . Pacemaker     November, 2010, Dr Allred  . S/P laminectomy     June 22, 2011  . Syncope     2010, treated with pacemaker  . Drug therapy     FLECAINIDE  . Mitral regurgitation     mild, echo, October, 2010  . Mediastinal lymphadenopathy   . Venous insufficiency     Chronic  . Hypokalemia     October, 2012, potassium dose increased  . Hiatal hernia 2006    EGD  . Acute gastric ulcer without mention of hemorrhage, perforation, or obstruction 2006    EGD  . Esophagitis, unspecified 2006    EGD  . Allergy     seasonal  .  Diverticulosis   . Hx of adenomatous colonic polyps   . Fecal incontinence   . Breast cancer   . Osteopenia     Past Surgical History  Procedure Date  . Breast surgery     Left  . Abdominal hysterectomy   . Back surgery     1973, 2012   . Ankle surgery     Patient Active Problem List  Diagnosis  . DM  . ALLERGIC RHINITIS  . MEDIASTINAL LYMPHADENOPATHY  . THYROID NODULE, HX OF  . Ulcer of toe  . Tachy-brady syndrome  . Atrial fibrillation  . Atrial flutter  . Chronic diastolic heart failure  . Shortness of breath  . Pulmonary HTN  . DVT (deep venous thrombosis)  . Pulmonary embolus  . Carotid artery disease  . Breast cancer  . Ejection fraction  . Warfarin anticoagulation  . Pacemaker-[employer]  . S/P laminectomy  . Syncope  . Drug therapy  . Mitral regurgitation  . Mediastinal lymphadenopathy  . Venous insufficiency  . Hypokalemia  . Osteopenia    ROS   Patient denies fever, chills, headache, sweats, rash, change in vision, change in hearing, chest pain, cough, nausea vomiting, urinary symptoms. All of the systems are reviewed and are negative.  PHYSICAL EXAM  Patient is oriented to person time and place. Affect is normal. There is no jugular venous distention. Lungs are clear. Respiratory effort is nonlabored. Cardiac exam reveals S1 and S2. The rhythm is regular. The abdomen is soft. There is no peripheral edema.  Filed Vitals:   08/20/12 1455  BP: 120/80  Pulse: 68  Height: 6' (1.829 m)  Weight: 232 lb (105.235 kg)     ASSESSMENT & PLAN

## 2012-08-20 NOTE — Patient Instructions (Addendum)
Your physician wants you to follow-up in:  6 months. You will receive a reminder letter in the mail two months in advance. If you don't receive a letter, please call our office to schedule the follow-up appointment.   

## 2012-08-20 NOTE — Assessment & Plan Note (Signed)
There has been no recurrent syncope. The patient injured her foot but she did not have syncope.

## 2012-08-20 NOTE — Assessment & Plan Note (Signed)
Patient has not had any symptomatic episodes. She continues on flecainide and Coumadin. No change in therapy.

## 2012-08-24 ENCOUNTER — Ambulatory Visit: Payer: Medicare Other | Admitting: Cardiology

## 2012-09-03 ENCOUNTER — Ambulatory Visit (INDEPENDENT_AMBULATORY_CARE_PROVIDER_SITE_OTHER): Payer: Medicare Other | Admitting: *Deleted

## 2012-09-03 DIAGNOSIS — Z7901 Long term (current) use of anticoagulants: Secondary | ICD-10-CM

## 2012-09-03 DIAGNOSIS — I4891 Unspecified atrial fibrillation: Secondary | ICD-10-CM

## 2012-09-03 DIAGNOSIS — I2699 Other pulmonary embolism without acute cor pulmonale: Secondary | ICD-10-CM

## 2012-09-16 ENCOUNTER — Other Ambulatory Visit: Payer: Self-pay | Admitting: *Deleted

## 2012-09-16 MED ORDER — DICYCLOMINE HCL 10 MG PO CAPS: 10.0000 mg | ORAL_CAPSULE | Freq: Two times a day (BID) | ORAL | Status: DC

## 2012-10-02 ENCOUNTER — Ambulatory Visit (INDEPENDENT_AMBULATORY_CARE_PROVIDER_SITE_OTHER): Payer: Medicare Other

## 2012-10-02 DIAGNOSIS — Z7901 Long term (current) use of anticoagulants: Secondary | ICD-10-CM

## 2012-10-02 DIAGNOSIS — I2699 Other pulmonary embolism without acute cor pulmonale: Secondary | ICD-10-CM

## 2012-10-02 DIAGNOSIS — I4891 Unspecified atrial fibrillation: Secondary | ICD-10-CM

## 2012-10-02 LAB — POCT INR: INR: 2.5

## 2012-10-30 ENCOUNTER — Ambulatory Visit (INDEPENDENT_AMBULATORY_CARE_PROVIDER_SITE_OTHER): Payer: Medicare Other | Admitting: *Deleted

## 2012-10-30 DIAGNOSIS — Z7901 Long term (current) use of anticoagulants: Secondary | ICD-10-CM

## 2012-10-30 DIAGNOSIS — I2699 Other pulmonary embolism without acute cor pulmonale: Secondary | ICD-10-CM

## 2012-10-30 DIAGNOSIS — I4891 Unspecified atrial fibrillation: Secondary | ICD-10-CM

## 2012-10-30 LAB — POCT INR: INR: 3.3

## 2012-11-14 ENCOUNTER — Other Ambulatory Visit: Payer: Self-pay

## 2012-11-16 ENCOUNTER — Other Ambulatory Visit: Payer: Self-pay | Admitting: *Deleted

## 2012-11-16 ENCOUNTER — Other Ambulatory Visit: Payer: Self-pay

## 2012-11-16 MED ORDER — WARFARIN SODIUM 5 MG PO TABS: ORAL_TABLET | ORAL | Status: DC

## 2012-11-17 ENCOUNTER — Other Ambulatory Visit: Payer: Self-pay

## 2012-11-17 MED ORDER — FLECAINIDE ACETATE 100 MG PO TABS: 100.0000 mg | ORAL_TABLET | Freq: Two times a day (BID) | ORAL | Status: DC

## 2012-11-18 ENCOUNTER — Other Ambulatory Visit: Payer: Self-pay | Admitting: Emergency Medicine

## 2012-11-18 DIAGNOSIS — I495 Sick sinus syndrome: Secondary | ICD-10-CM

## 2012-11-18 MED ORDER — METOPROLOL SUCCINATE ER 25 MG PO TB24: 25.0000 mg | ORAL_TABLET | Freq: Every day | ORAL | Status: DC

## 2012-11-20 ENCOUNTER — Ambulatory Visit (INDEPENDENT_AMBULATORY_CARE_PROVIDER_SITE_OTHER): Payer: Medicare Other | Admitting: *Deleted

## 2012-11-20 DIAGNOSIS — I4891 Unspecified atrial fibrillation: Secondary | ICD-10-CM

## 2012-11-20 DIAGNOSIS — Z7901 Long term (current) use of anticoagulants: Secondary | ICD-10-CM

## 2012-11-20 DIAGNOSIS — I2699 Other pulmonary embolism without acute cor pulmonale: Secondary | ICD-10-CM

## 2012-11-20 LAB — POCT INR: INR: 2.5

## 2012-12-02 ENCOUNTER — Encounter: Payer: Self-pay | Admitting: *Deleted

## 2012-12-13 ENCOUNTER — Encounter (HOSPITAL_COMMUNITY): Payer: Self-pay | Admitting: Emergency Medicine

## 2012-12-13 ENCOUNTER — Emergency Department (HOSPITAL_COMMUNITY): Payer: Medicare Other

## 2012-12-13 ENCOUNTER — Observation Stay (HOSPITAL_COMMUNITY)
Admission: EM | Admit: 2012-12-13 | Discharge: 2012-12-14 | Disposition: A | Discharge status: Home / Self Care | Payer: Medicare Other | Attending: Cardiovascular Disease | Admitting: Cardiovascular Disease

## 2012-12-13 DIAGNOSIS — I4891 Unspecified atrial fibrillation: Secondary | ICD-10-CM

## 2012-12-13 DIAGNOSIS — I498 Other specified cardiac arrhythmias: Principal | ICD-10-CM | POA: Insufficient documentation

## 2012-12-13 DIAGNOSIS — I10 Essential (primary) hypertension: Secondary | ICD-10-CM | POA: Insufficient documentation

## 2012-12-13 DIAGNOSIS — E119 Type 2 diabetes mellitus without complications: Secondary | ICD-10-CM | POA: Insufficient documentation

## 2012-12-13 DIAGNOSIS — R0602 Shortness of breath: Secondary | ICD-10-CM | POA: Insufficient documentation

## 2012-12-13 DIAGNOSIS — I4892 Unspecified atrial flutter: Secondary | ICD-10-CM

## 2012-12-13 DIAGNOSIS — I2789 Other specified pulmonary heart diseases: Secondary | ICD-10-CM | POA: Insufficient documentation

## 2012-12-13 DIAGNOSIS — Z7901 Long term (current) use of anticoagulants: Secondary | ICD-10-CM | POA: Insufficient documentation

## 2012-12-13 DIAGNOSIS — I5032 Chronic diastolic (congestive) heart failure: Secondary | ICD-10-CM | POA: Insufficient documentation

## 2012-12-13 DIAGNOSIS — I059 Rheumatic mitral valve disease, unspecified: Secondary | ICD-10-CM

## 2012-12-13 LAB — BASIC METABOLIC PANEL
BUN: 18 mg/dL (ref 6–23)
CO2: 21 mEq/L (ref 19–32)
Chloride: 104 mEq/L (ref 96–112)
Creatinine, Ser: 1.12 mg/dL — ABNORMAL HIGH (ref 0.50–1.10)
GFR calc Af Amer: 52 mL/min — ABNORMAL LOW (ref >90)
Glucose, Bld: 177 mg/dL — ABNORMAL HIGH (ref 70–99)
Potassium: 4.8 mEq/L (ref 3.5–5.1)

## 2012-12-13 LAB — CBC
HCT: 38 % (ref 36.0–46.0)
Hemoglobin: 13.1 g/dL (ref 12.0–15.0)
MCHC: 34.5 g/dL (ref 30.0–36.0)
MCV: 84.8 fL (ref 78.0–100.0)

## 2012-12-13 LAB — GLUCOSE, CAPILLARY
Glucose-Capillary: 97 mg/dL (ref 70–99)
Glucose-Capillary: 169 mg/dL — ABNORMAL HIGH (ref 70–99)

## 2012-12-13 LAB — PROTIME-INR: Prothrombin Time: 32.2 seconds — ABNORMAL HIGH (ref 11.6–15.2)

## 2012-12-13 LAB — TSH: TSH: 0.654 u[IU]/mL (ref 0.350–4.500)

## 2012-12-13 MED ORDER — CYCLOBENZAPRINE HCL 10 MG PO TABS
10.0000 mg | ORAL_TABLET | Freq: Three times a day (TID) | ORAL | Status: DC | PRN
  Filled 2012-12-13: qty 1

## 2012-12-13 MED ORDER — DICYCLOMINE HCL 10 MG PO CAPS
10.0000 mg | ORAL_CAPSULE | Freq: Two times a day (BID) | ORAL | Status: DC
Start: 2012-12-13 — End: 2012-12-14
  Administered 2012-12-13 – 2012-12-14 (×2): 10 mg via ORAL
  Filled 2012-12-13 (×4): qty 1

## 2012-12-13 MED ORDER — ACETAMINOPHEN 325 MG PO TABS: 650.0000 mg | ORAL_TABLET | ORAL | Status: DC | PRN

## 2012-12-13 MED ORDER — SODIUM CHLORIDE 0.9 % IJ SOLN: 3.0000 mL | INTRAMUSCULAR | Status: DC | PRN

## 2012-12-13 MED ORDER — WARFARIN - PHARMACIST DOSING INPATIENT: Freq: Every day | Status: DC

## 2012-12-13 MED ORDER — DILTIAZEM HCL 25 MG/5ML IV SOLN
20.0000 mg | Freq: Once | INTRAVENOUS | Status: AC
  Administered 2012-12-13: 20 mg via INTRAVENOUS
  Filled 2012-12-13: qty 5

## 2012-12-13 MED ORDER — ANASTROZOLE 1 MG PO TABS
1.0000 mg | ORAL_TABLET | Freq: Every day | ORAL | Status: DC
  Administered 2012-12-13 – 2012-12-14 (×2): 1 mg via ORAL
  Filled 2012-12-13 (×2): qty 1

## 2012-12-13 MED ORDER — SODIUM CHLORIDE 0.9 % IJ SOLN
3.0000 mL | Freq: Two times a day (BID) | INTRAMUSCULAR | Status: DC
  Administered 2012-12-13 – 2012-12-14 (×3): 3 mL via INTRAVENOUS

## 2012-12-13 MED ORDER — INSULIN ASPART 100 UNIT/ML ~~LOC~~ SOLN
0.0000 [IU] | Freq: Three times a day (TID) | SUBCUTANEOUS | Status: DC
  Administered 2012-12-14: 3 [IU] via SUBCUTANEOUS

## 2012-12-13 MED ORDER — SODIUM CHLORIDE 0.9 % IV SOLN: 250.0000 mL | INTRAVENOUS | Status: DC | PRN

## 2012-12-13 MED ORDER — POTASSIUM CHLORIDE 20 MEQ/15ML (10%) PO LIQD
20.0000 meq | Freq: Every day | ORAL | Status: DC
  Administered 2012-12-13 – 2012-12-14 (×2): 20 meq via ORAL
  Filled 2012-12-13 (×2): qty 15

## 2012-12-13 MED ORDER — BETHANECHOL CHLORIDE 25 MG PO TABS
25.0000 mg | ORAL_TABLET | Freq: Every day | ORAL | Status: DC
  Administered 2012-12-13 – 2012-12-14 (×2): 25 mg via ORAL
  Filled 2012-12-13 (×2): qty 1

## 2012-12-13 MED ORDER — ONDANSETRON HCL 4 MG/2ML IJ SOLN: 4.0000 mg | Freq: Four times a day (QID) | INTRAMUSCULAR | Status: DC | PRN

## 2012-12-13 MED ORDER — FUROSEMIDE 40 MG PO TABS
40.0000 mg | ORAL_TABLET | Freq: Every day | ORAL | Status: DC
  Administered 2012-12-13 – 2012-12-14 (×2): 40 mg via ORAL
  Filled 2012-12-13 (×2): qty 1

## 2012-12-13 MED ORDER — METOPROLOL SUCCINATE ER 50 MG PO TB24
50.0000 mg | ORAL_TABLET | Freq: Every day | ORAL | Status: DC
  Administered 2012-12-13 – 2012-12-14 (×2): 50 mg via ORAL
  Filled 2012-12-13 (×2): qty 1

## 2012-12-13 MED ORDER — NITROGLYCERIN 0.4 MG SL SUBL: 0.4000 mg | SUBLINGUAL_TABLET | SUBLINGUAL | Status: DC | PRN

## 2012-12-13 MED ORDER — FLECAINIDE ACETATE 100 MG PO TABS
100.0000 mg | ORAL_TABLET | Freq: Two times a day (BID) | ORAL | Status: DC
  Administered 2012-12-13 – 2012-12-14 (×3): 100 mg via ORAL
  Filled 2012-12-13 (×4): qty 1

## 2012-12-13 MED ORDER — DILTIAZEM HCL 100 MG IV SOLR: 5.0000 mg/h | Freq: Once | INTRAVENOUS | Status: DC

## 2012-12-13 NOTE — H&P (Signed)
Patient ID: [name] [name] MRN: [id number], DOB/AGE: [birth year]   Admit date: 12/13/2012   Primary Physician: Foye Spurling, MD Primary Cardiologist: Veatrice Bourbon, MD  Pt. Profile:  79 y/o female with h/o afib/tachy-brady syndrome on chronic coumadin who presented to the ED this AM with dyspnea and found to be in rapid afib.  Problem List  Past Medical History  Diagnosis Date  . Hypertension   . Diabetes mellitus   . Tachy-brady syndrome     s/p pacer 07/2009  . Atrial fibrillation     flecainide; coumadin  . Atrial flutter     November, 2010, flecainide, Coumadin  . Chronic diastolic heart failure     echo 10/10: mild LVH, EF 55-60%, mild AI, mild MR, mild to mod LAE, mild RVE, severe RAE, mod TR, PASP 53  . Shortness of breath     myoview 11/10: EF 65%, no ischemia  . Pulmonary HTN     53 mmHg echo, 2010, moderate TR, mild right ventricular enlargement  . DVT (deep venous thrombosis)     Pulmonary embolus  . Pulmonary embolus 2006    2006, with DVT  . Carotid artery disease     [id number]: RICA [id number], LICA [id number]  . Thyroid nodule     non-neoplastic goiter  . Breast cancer   . Ejection fraction     EF 60%, echo, October, 2010  . Warfarin anticoagulation     Atrial fibrillation  . Pacemaker     November, 2010, Dr Rayann Heman - MDT ADDRL1 Adapta DC PPM ser #: [id number] H    . S/P laminectomy     June 22, 2011  . Syncope     2010, treated with pacemaker  . Drug therapy     FLECAINIDE  . Mitral regurgitation     mild, echo, October, 2010  . Mediastinal lymphadenopathy   . Venous insufficiency     Chronic  . Hypokalemia     October, 2012, potassium dose increased  . Hiatal hernia 2006    EGD  . Acute gastric ulcer without mention of hemorrhage, perforation, or obstruction 2006    EGD  . Esophagitis, unspecified 2006    EGD  . Allergy     seasonal  . Diverticulosis   . Hx of adenomatous colonic polyps   . Fecal incontinence   . Breast cancer   . Osteopenia      Past Surgical History  Procedure Laterality Date  . Breast surgery      Left  . Abdominal hysterectomy    . Back surgery      1973, 2012   . Ankle surgery      Allergies  Allergies  Allergen Reactions  . Iohexol Other (See Comments)    unknown   HPI  79 y/o female with the above problem list. She has a history of paroxysmal atrial fibrillation and has been rhythm controlled with flecainide therapy. It has been sometime since she has had a recurrence of atrial fibrillation. She's also on chronic Coumadin in the setting of paroxysmal atrial fibrillation along with history of DVT and PE. She is quite active at home and is primarily responsible for taking care of her brother who was in his late 55s. Other than bilateral knee discomfort related to arthritis, she gets along pretty well.  This morning at approximately 3 AM, she awoke to use the bathroom and when walking to the bathroom noted significant dyspnea as well as tingling in her fingers bilaterally. After using the  bathroom, her dyspnea worsened and while walking back to bed she also felt lightheaded, as though she might pass out. At that point, she activated her medic alert and EMS subsequently presented to her house. She was found to be tachycardic with rates in the 170s to 180s and was treated with adenosine 6 mg, followed by 12 mg, followed by an additional 12 mg. Despite adenosine, she remained tachycardic and she was taken to the [facility]. Here, she was given a diltiazem bolus and her rhythm slowed from 176 to 95 with almost immediate symptomatic relief. Currently she has no complaints.  She denies any recent chest pain, palpitations, pnd, orthopnea, n, v, syncope, edema, weight gain, or early satiety.  Home Medications  Prior to Admission medications   Medication Sig Start Date End Date Taking? Authorizing Provider  anastrozole (ARIMIDEX) 1 MG tablet Take 1 tablet (1 mg total) by mouth daily. 08/03/12  Yes Maryanna Shape, NP    bethanechol (URECHOLINE) 25 MG tablet Take 25 mg by mouth daily.   Yes Historical Provider, MD  celecoxib (CELEBREX) 200 MG capsule Take 200 mg by mouth daily as needed for pain.   Yes Historical Provider, MD  cyclobenzaprine (FLEXERIL) 10 MG tablet Take 10 mg by mouth 3 (three) times daily as needed for muscle spasms.  07/01/11  Yes Historical Provider, MD  dicyclomine (BENTYL) 10 MG capsule Take 1 capsule (10 mg total) by mouth 2 (two) times daily with a meal. 09/16/12 09/16/13 Yes Lafayette Dragon, MD  flecainide (TAMBOCOR) 100 MG tablet Take 1 tablet (100 mg total) by mouth 2 (two) times daily. 11/17/12  Yes Carlena Bjornstad, MD  furosemide (LASIX) 40 MG tablet Take 40 mg by mouth daily. 07/03/11  Yes Scott T Kathlen Mody, PA-C  metFORMIN (GLUMETZA) 500 MG (MOD) 24 hr tablet Take 500 mg by mouth daily with breakfast.    Yes Historical Provider, MD  metoprolol succinate (TOPROL-XL) 25 MG 24 hr tablet Take 25 mg by mouth daily. 11/18/12 11/18/13 Yes Thompson Grayer, MD  potassium chloride 20 MEQ/15ML (10%) solution Take 20 mEq by mouth daily.   Yes Historical Provider, MD  pyridoxine (B-6) 100 MG tablet Take 200 mg by mouth daily.    Yes Historical Provider, MD  vitamin B-12 (CYANOCOBALAMIN) 500 MCG tablet Take 500 mcg by mouth daily.    Yes Historical Provider, MD  warfarin (COUMADIN) 5 MG tablet Take 2.5-5 mg by mouth daily. Takes 5mg  Sunday and Tuesday. Takes 0.5 tablet (2.5mg ) all other days. 11/16/12  Yes Carlena Bjornstad, MD   Family History  Family History  Problem Relation Age of Onset  . Heart disease Mother   . COPD Father   . Colon cancer Neg Hx    Social History  History   Social History  . Marital Status: Widowed    Spouse Name: N/A    Number of Children: 3  . Years of Education: N/A   Occupational History  . Retired     Social History Main Topics  . Smoking status: Former Smoker -- 4.00 packs/day    Quit date: 10/26/1998  . Smokeless tobacco: Never Used  . Alcohol Use: No  . Drug  Use: No  . Sexually Active: Not on file   Other Topics Concern  . Not on file   Social History Narrative   Widowed.  Lives in [place] by herself.  Avoids caffeine.  Very active, taking care of her brother who also lives by himself and is in  his late 55's.    Review of Systems General:  No chills, fever, night sweats or weight changes.  Cardiovascular:  +++ dyspnea on exertion with presyncope this AM - now resolved.  No chest pain, edema, orthopnea, palpitations, paroxysmal nocturnal dyspnea. Dermatological: No rash, lesions/masses Respiratory: No cough, dyspnea Urologic: No hematuria, dysuria Abdominal:   No nausea, vomiting, diarrhea, bright red blood per rectum, melena, or hematemesis Neurologic:  No visual changes, wkns, changes in mental status. All other systems reviewed and are otherwise negative except as noted above.  Physical Exam  Blood pressure 197/117, pulse 95, temperature 97.3 F (36.3 C), resp. rate 24, SpO2 96.00%.  General: Pleasant, NAD Psych: Normal affect. Neuro: Alert and oriented X 3. Moves all extremities spontaneously. HEENT: Normal  Neck: Supple without bruits or JVD. Lungs:  Resp regular and unlabored, CTA. Heart: RRR no s3, s4, 2/6 syst m @ apex. Abdomen: Soft, non-tender, non-distended, BS + x 4.  Extremities: No clubbing, cyanosis or edema. DP/PT/Radials 2+ and equal bilaterally.  Labs   Recent Labs  12/13/12 0703  TROPONINI <0.30   Lab Results  Component Value Date   WBC 3.7* 12/13/2012   HGB 13.1 12/13/2012   HCT 38.0 12/13/2012   MCV 84.8 12/13/2012   PLT 270 12/13/2012     Recent Labs Lab 12/13/12 0651  NA 138  K 4.8  CL 104  CO2 21  BUN 18  CREATININE 1.12*  CALCIUM 9.2  GLUCOSE 177*   Radiology/Studies  Dg Chest Portable 1 View  12/13/2012  *RADIOLOGY REPORT*  Clinical Data: Short of breath  PORTABLE CHEST - 1 VIEW  Comparison: 07/06/2011  Findings: Severe cardiomegaly.  Vascular congestion.  Central and basilar edema.  Dual  lead left subclavian pacemaker device and leads are stable.  No pneumothorax. New right-sided mid level rib deformity has a chronic appearance.  IMPRESSION: CHF with central basilar edema.   Original Report Authenticated By: Marybelle Killings, M.D.    ECG  1. SVT - ? 1:1 atrial flutter @ 176 with inferolateral ST dep. 2. 2:1 aflutter, 95, minimal inflater st dep  ASSESSMENT AND PLAN  1.  SVT/Aflutter RVR:  She developed symptomatc, narrow complex tachycardia this AM, which was not responsive to adenosine but slowed after receiving diltiazem bolus.  Review of ECG's suggests atrial flutter though strips in ER also show some afib.  Her ER HR trend is interesting as she comes in in the 170's, drops abruptly to the 90's after receiving IV dilt, and then drops abruptly a third time and is now a-paced.  With rate control, she is now asymptomatic.  Will admit, interrogate PPM, continue flecainide and coumadin,  Titrate bb.  Check echo, tsh, Mg.  Will ask EP to see in AM.  2.  Afib/tachy-brady:  Cont flecainide/coumadin as above.  INR Rx.  3.  HTN:  BP markedly elevated in setting of tachycardia earlier.  Follow.  4.  DM:  Hold metformin.  Add ssi.  Signed, Murray Hodgkins, NP 12/13/2012, 8:33 AM   Attending Note:   The patient was seen and examined.  Agree with assessment and plan as noted above.  Changes made to the above note as needed.  Very interesting presentation.  She has had rapid HR - ? 1:1 atrial Flutter., a slower irregular tachycardia that looks like atrial fib. And is now A-paced.    She is feeling much better.  Exam is as above and is essentially benign  We have increased her metoprolol.  Will  have EP see her in the am.  Ramond Dial., MD, [facility] 12/13/2012, 2:11 PM

## 2012-12-13 NOTE — ED Provider Notes (Signed)
History     CSN: [id number]  Arrival date & time 12/13/12  [id number]   First MD Initiated Contact with Patient 12/13/12 [phone]      Chief Complaint  Patient presents with  . Shortness of Breath  . Tachycardia    (Consider location/radiation/quality/duration/timing/severity/associated sxs/prior treatment) Patient is a 79 y.o. female presenting with shortness of breath. The history is provided by the patient and the EMS personnel.  Shortness of Breath She was awakened at 3 AM with dyspnea and a sense of her heart racing. She denies chest pain, heaviness, tightness, pressure. She didn't denies nausea or vomiting. She was not sweaty. She pressed her life alert button because she felt like she might pass out. EMS arrived and noted that she was in SVT and treated her with adenosine. She received 6 mg, then 12 mg, then 12 mg again without converting and she was brought into the emergency department. Of note, she does have a pacemaker or blackouts and she is being treated for atrial fibrillation. She is on warfarin.  Past Medical History  Diagnosis Date  . Hypertension   . Diabetes mellitus   . Tachy-brady syndrome     s/p pacer 07/2009  . Atrial fibrillation     flecainide; coumadin  . Atrial flutter     November, 2010, flecainide, Coumadin  . Chronic diastolic heart failure     echo 10/10: mild LVH, EF 55-60%, mild AI, mild MR, mild to mod LAE, mild RVE, severe RAE, mod TR, PASP 53  . Shortness of breath     myoview 11/10: EF 65%, no ischemia  . Pulmonary HTN     53 mmHg echo, 2010, moderate TR, mild right ventricular enlargement  . DVT (deep venous thrombosis)     Pulmonary embolus  . Pulmonary embolus 2006    2006, with DVT  . Carotid artery disease     [id number]: RICA [id number], LICA 123456  . Thyroid nodule     non-neoplastic goiter  . Breast cancer   . Ejection fraction     EF 60%, echo, October, 2010  . Warfarin anticoagulation     Atrial fibrillation  . Pacemaker     November,  2010, Dr Allred  . S/P laminectomy     June 22, 2011  . Syncope     2010, treated with pacemaker  . Drug therapy     FLECAINIDE  . Mitral regurgitation     mild, echo, October, 2010  . Mediastinal lymphadenopathy   . Venous insufficiency     Chronic  . Hypokalemia     October, 2012, potassium dose increased  . Hiatal hernia 2006    EGD  . Acute gastric ulcer without mention of hemorrhage, perforation, or obstruction 2006    EGD  . Esophagitis, unspecified 2006    EGD  . Allergy     seasonal  . Diverticulosis   . Hx of adenomatous colonic polyps   . Fecal incontinence   . Breast cancer   . Osteopenia     Past Surgical History  Procedure Laterality Date  . Breast surgery      Left  . Abdominal hysterectomy    . Back surgery      1973, 2012   . Ankle surgery      Family History  Problem Relation Age of Onset  . Heart disease Mother   . COPD Father   . Colon cancer Neg Hx     History  Substance Use Topics  .  Smoking status: Former Smoker -- 4.00 packs/day    Quit date: 10/26/1998  . Smokeless tobacco: Never Used  . Alcohol Use: No    OB History   Grav Para Term Preterm Abortions TAB SAB Ect Mult Living                  Review of Systems  Respiratory: Positive for shortness of breath.   All other systems reviewed and are negative.    Allergies  Iohexol  Home Medications   Current Outpatient Rx  Name  Route  Sig  Dispense  Refill  . anastrozole (ARIMIDEX) 1 MG tablet   Oral   Take 1 tablet (1 mg total) by mouth daily.   90 tablet   3   . bethanechol (URECHOLINE) 25 MG tablet   Oral   Take 25 mg by mouth daily.         . celecoxib (CELEBREX) 200 MG capsule   Oral   Take 200 mg by mouth daily as needed for pain.         . cyclobenzaprine (FLEXERIL) 10 MG tablet   Oral   Take 10 mg by mouth 3 (three) times daily as needed for muscle spasms.          Marland Kitchen dicyclomine (BENTYL) 10 MG capsule   Oral   Take 1 capsule (10 mg total)  by mouth 2 (two) times daily with a meal.   60 capsule   3   . flecainide (TAMBOCOR) 100 MG tablet   Oral   Take 1 tablet (100 mg total) by mouth 2 (two) times daily.   60 tablet   3   . furosemide (LASIX) 40 MG tablet   Oral   Take 40 mg by mouth daily.         . metFORMIN (GLUMETZA) 500 MG (MOD) 24 hr tablet   Oral   Take 500 mg by mouth daily with breakfast.          . metoprolol succinate (TOPROL-XL) 25 MG 24 hr tablet   Oral   Take 25 mg by mouth daily.         . potassium chloride 20 MEQ/15ML (10%) solution   Oral   Take 20 mEq by mouth daily.         Marland Kitchen pyridoxine (B-6) 100 MG tablet   Oral   Take 200 mg by mouth daily.          . vitamin B-12 (CYANOCOBALAMIN) 500 MCG tablet   Oral   Take 500 mcg by mouth daily.          Marland Kitchen warfarin (COUMADIN) 5 MG tablet   Oral   Take 2.5-5 mg by mouth daily. Takes 5mg  Sunday and Tuesday. Takes 0.5 tablet (2.5mg ) all other days.           BP 197/117  Pulse 179  Temp(Src) 97.3 F (36.3 C)  Resp 24  SpO2 96%  Physical Exam  Nursing note and vitals reviewed.  79 year old female, resting comfortably and in no acute distress. Vital signs are significant for tachycardia with heart rate of 179, tachypnea with respiratory rate of 24, and hypertension with blood pressure 197/117.Marland Kitchen Oxygen saturation is 96%, which is normal. Head is normocephalic and atraumatic. PERRLA, EOMI. Oropharynx is clear. Neck is nontender and supple without adenopathy or JVD. Back is nontender and there is no CVA tenderness. Lungs are clear without rales, wheezes, or rhonchi. Chest is nontender. Heart is markedly tachycardic  without murmur. Abdomen is soft, flat, nontender without masses or hepatosplenomegaly and peristalsis is normoactive. Extremities have 1+ edema, full range of motion is present. Venous stasis changes are present. Skin is warm and dry without rash. Neurologic: Mental status is normal, cranial nerves are intact, there  are no motor or sensory deficits.  ED Course  Procedures (including critical care time)  Results for orders placed during the hospital encounter of [id number]  BASIC METABOLIC PANEL      Result Value Range   Sodium 138  135 - 145 mEq/L   Potassium 4.8  3.5 - 5.1 mEq/L   Chloride 104  96 - 112 mEq/L   CO2 21  19 - 32 mEq/L   Glucose, Bld 177 (*) 70 - 99 mg/dL   BUN 18  6 - 23 mg/dL   Creatinine, Ser 1.12 (*) 0.50 - 1.10 mg/dL   Calcium 9.2  8.4 - 10.5 mg/dL   GFR calc non Af Amer 45 (*) >90 mL/min   GFR calc Af Amer 52 (*) >90 mL/min  CBC      Result Value Range   WBC 3.7 (*) 4.0 - 10.5 K/uL   RBC 4.48  3.87 - 5.11 MIL/uL   Hemoglobin 13.1  12.0 - 15.0 g/dL   HCT 38.0  36.0 - 46.0 %   MCV 84.8  78.0 - 100.0 fL   MCH 29.2  26.0 - 34.0 pg   MCHC 34.5  30.0 - 36.0 g/dL   RDW 15.2  11.5 - 15.5 %   Platelets 270  150 - 400 K/uL  APTT      Result Value Range   aPTT 51 (*) 24 - 37 seconds  PROTIME-INR      Result Value Range   Prothrombin Time 32.2 (*) 11.6 - 15.2 seconds   INR 3.37 (*) 0.00 - 1.49  TROPONIN I      Result Value Range   Troponin I <0.30  <0.30 ng/mL   Dg Chest Portable 1 View  12/13/2012  *RADIOLOGY REPORT*  Clinical Data: Short of breath  PORTABLE CHEST - 1 VIEW  Comparison: 07/06/2011  Findings: Severe cardiomegaly.  Vascular congestion.  Central and basilar edema.  Dual lead left subclavian pacemaker device and leads are stable.  No pneumothorax. New right-sided mid level rib deformity has a chronic appearance.  IMPRESSION: CHF with central basilar edema.   Original Report Authenticated By: Marybelle Killings, M.D.      Date: 12/13/2012  Rate: 176  Rhythm: atrial fibrillation  QRS Axis: normal  Intervals: normal  ST/T Wave abnormalities: ST depression in multiple leads most likely rate related  Conduction Disutrbances:none  Narrative Interpretation: Atrial fibrillation with rapid ventricular response, diffuse ST depression which is likely rate related, possible  old anteroseptal myocardial infarction. No prior ECG available for comparison.  Old EKG Reviewed: none available    1. Atrial fibrillation with rapid ventricular response    CRITICAL CARE Performed by: [id number]   Total critical care time: 50 minutes  Critical care time was exclusive of separately billable procedures and treating other patients.  Critical care was necessary to treat or prevent imminent or life-threatening deterioration.  Critical care was time spent personally by me on the following activities: development of treatment plan with patient and/or surrogate as well as nursing, discussions with consultants, evaluation of patient's response to treatment, examination of patient, obtaining history from patient or surrogate, ordering and performing treatments and interventions, ordering and review of laboratory studies, ordering and review  of radiographic studies, pulse oximetry and re-evaluation of patient's condition.    MDM  Atrial fibrillation with rapid ventricular response. Strip transmitted by EMS does look like SVT, but ECG in the ED shows definite atrial fibrillation. She is already on a beta blocker although it is a relatively low dose. She will be given to diltiazem to try to control rate.  After being given diltiazem, she converted to sinus rhythm. Cardiology has been consulted and are here evaluating the patient. Also, of note, her INR is supratherapeutic.     Delora Fuel, MD [id number]

## 2012-12-13 NOTE — Progress Notes (Signed)
ANTICOAGULATION CONSULT NOTE - Initial Consult  Pharmacy Consult for coumadin Indication: atrial fibrillation  Allergies  Allergen Reactions  . Iohexol Other (See Comments)    unknown    Vital Signs: Temp: 97.3 F (36.3 C) (03/16 0639) BP: 128/83 mmHg (03/16 1000) Pulse Rate: 59 (03/16 1000)  Labs:  Recent Labs  12/13/12 0651 12/13/12 0703  HGB 13.1  --   HCT 38.0  --   PLT 270  --   APTT 51*  --   LABPROT 32.2*  --   INR 3.37*  --   CREATININE 1.12*  --   TROPONINI  --  <0.30    The CrCl is unknown because both a height and weight (above a minimum accepted value) are required for this calculation.   Medical History: Past Medical History  Diagnosis Date  . Hypertension   . Diabetes mellitus   . Tachy-brady syndrome     s/p pacer 07/2009  . Atrial fibrillation     flecainide; coumadin  . Atrial flutter     November, 2010, flecainide, Coumadin  . Chronic diastolic heart failure     echo 10/10: mild LVH, EF 55-60%, mild AI, mild MR, mild to mod LAE, mild RVE, severe RAE, mod TR, PASP 53  . Shortness of breath     myoview 11/10: EF 65%, no ischemia  . Pulmonary HTN     53 mmHg echo, 2010, moderate TR, mild right ventricular enlargement  . DVT (deep venous thrombosis)     Pulmonary embolus  . Pulmonary embolus 2006    2006, with DVT  . Carotid artery disease     [id number]: RICA [id number], LICA 123456  . Thyroid nodule     non-neoplastic goiter  . Breast cancer   . Ejection fraction     EF 60%, echo, October, 2010  . Warfarin anticoagulation     Atrial fibrillation  . Pacemaker     November, 2010, Dr Rayann Heman - MDT ADDRL1 Adapta DC PPM ser #: [id number] H    . S/P laminectomy     June 22, 2011  . Syncope     2010, treated with pacemaker  . Drug therapy     FLECAINIDE  . Mitral regurgitation     mild, echo, October, 2010  . Mediastinal lymphadenopathy   . Venous insufficiency     Chronic  . Hypokalemia     October, 2012, potassium dose increased  .  Hiatal hernia 2006    EGD  . Acute gastric ulcer without mention of hemorrhage, perforation, or obstruction 2006    EGD  . Esophagitis, unspecified 2006    EGD  . Allergy     seasonal  . Diverticulosis   . Hx of adenomatous colonic polyps   . Fecal incontinence   . Breast cancer   . Osteopenia     Medications:  Warfarin 5mg  on Sun + Tues, 2.5mg  all other days  Assessment: 89 yof admitted with afib with RVR on chronic coumadin. INR today is supratherapeutic at 3.37. H/H is 13.1/38 and plts are 270. No bleeding noted.   Goal of Therapy:  INR 2-3   Plan:  1. No coumadin tonight - will f/u INR trend and restart likely tomorrow 2. Daily INR  Nnaemeka Samson, Rande Lawman 12/13/2012,10:37 AM

## 2012-12-13 NOTE — ED Notes (Signed)
Pt from [facility], c/o sob and numbness in hands. EMS ekg pt is in SVT. Did not convert with 03-12-11 adenosine. Pt c/o chest tightness when breathing. No pain Pt has pace maker for "blackouts"

## 2012-12-13 NOTE — Progress Notes (Signed)
  Echocardiogram 2D Echocardiogram has been performed.  [name] [name] 12/13/2012, 4:45 PM

## 2012-12-14 LAB — GLUCOSE, CAPILLARY
Glucose-Capillary: 184 mg/dL — ABNORMAL HIGH (ref 70–99)
Glucose-Capillary: 114 mg/dL — ABNORMAL HIGH (ref 70–99)

## 2012-12-14 LAB — CBC
HCT: 40.5 % (ref 36.0–46.0)
Hemoglobin: 13.2 g/dL (ref 12.0–15.0)
MCHC: 32.6 g/dL (ref 30.0–36.0)
MCV: 86 fL (ref 78.0–100.0)

## 2012-12-14 LAB — TROPONIN I: Troponin I: <0.3 ng/mL (ref <0.30)

## 2012-12-14 LAB — BASIC METABOLIC PANEL
BUN: 21 mg/dL (ref 6–23)
Chloride: 101 mEq/L (ref 96–112)
Creatinine, Ser: 1.24 mg/dL — ABNORMAL HIGH (ref 0.50–1.10)
GFR calc non Af Amer: 40 mL/min — ABNORMAL LOW (ref >90)
Glucose, Bld: 130 mg/dL — ABNORMAL HIGH (ref 70–99)
Potassium: 4.3 mEq/L (ref 3.5–5.1)

## 2012-12-14 MED ORDER — METOPROLOL SUCCINATE ER 25 MG PO TB24: 50.0000 mg | ORAL_TABLET | Freq: Every day | ORAL | Status: DC

## 2012-12-14 MED ORDER — WARFARIN SODIUM 2.5 MG PO TABS
2.5000 mg | ORAL_TABLET | Freq: Once | ORAL | Status: DC
  Filled 2012-12-14: qty 1

## 2012-12-14 NOTE — Consult Note (Signed)
Pt with symptomatic atrial tachy with 1:1 conduction and then transient 2:1 until returned to sinus NO clear precipitants and not a common occurrence for her.  Will increase her toprol and then have her followup as scheduled for PM in a few weeks with Dr Greggory Brandy; He rBP is mildly elevated

## 2012-12-14 NOTE — Progress Notes (Signed)
DC orders received.  Patient stable with no S/S of distress.  Medication and discharge information reviewed with patient.  Patient DC home with family. [name] [name], Ardeth Sportsman

## 2012-12-14 NOTE — Progress Notes (Signed)
ANTICOAGULATION CONSULT NOTE - Follow Up Consult  Pharmacy Consult for coumadin Indication: atrial fibrillation  Allergies  Allergen Reactions  . Iohexol Other (See Comments)    unknown    Patient Measurements: Height: 6' (182.9 cm) Weight: 230 lb 6.4 oz (104.509 kg) IBW/kg (Calculated) : 73.1   Vital Signs: Temp: 97.6 F (36.4 C) (03/17 0500) BP: 142/82 mmHg (03/17 0920) Pulse Rate: 63 (03/17 0920)  Labs:  Recent Labs  12/13/12 0651  12/13/12 1147 12/13/12 1810 12/14/12 0026 12/14/12 0500  HGB 13.1  --   --   --   --  13.2  HCT 38.0  --   --   --   --  40.5  PLT 270  --   --   --   --  274  APTT 51*  --   --   --   --   --   LABPROT 32.2*  --   --   --   --  24.2*  INR 3.37*  --   --   --   --  2.29*  CREATININE 1.12*  --   --   --   --  1.24*  TROPONINI  --   < > <0.30 <0.30 <0.30  --   < > = values in this interval not displayed.  Estimated Creatinine Clearance: 49 ml/min (by C-G formula based on Cr of 1.24).  Assessment: Patient's an 79 y.o F  with hx of Afib, PE, and DVT on coumadin PTA.  Dose held yesterday d/t subtherapeutic INR.  INR is therapeutic today at 2.29.  Goal of Therapy:  INR 2-3    Plan:  Plan: 1) coumadin 2.5mg  PO x1 today  Chia Rock P 12/14/2012,11:13 AM

## 2012-12-14 NOTE — Discharge Summary (Signed)
CARDIOLOGY DISCHARGE SUMMARY    Patient ID: [name] [name],  MRN: [id number], DOB/AGE: 1930/11/12 79 y.o.  Admit date: 12/13/2012 Discharge date: 12/14/2012  Primary Care Physician: Jeanann Lewandowsky, MD Primary Cardiologist: Thompson Grayer, MD  Primary Discharge Diagnosis:  1. Symptomatic atrial tachycardia  Secondary Discharge Diagnoses:  1. PAF 2. Tachy-brady syndrome s/p PPM implant 2010 3. Chronic diastolic HF 4. Pulmonary hypertension 5. Hypertension 6. DM 7. History of DVT  Procedures This Admission:  None   History and Hospital Course:  Ms. Pozo is a pleasant 79 year old woman with HTN, DM, pulmonary hypertension, chronic diastolic HF and atrial fibrillation/flutter and tachy-brady syndrome s/p PPM implant 2010 who presented yesterday morning with SOB, found to have a narrow complex tachycardia with rates in the 170s. She reports her SOB started fairly abruptly yesterday and was persistent followed by lightheadedness after she walked to the bathroom at home, which prompted her to activate her LifeAlert. She was then transported to [facility] via EMS. She also notes bilateral numbness in her fingers that started yesterday as well. She denies CP, palpitations or syncope. While in the ED, she was given IV diltiazem bolus which slowed her rate into the 90s. Her SOB resolved. Overnight she converted to an atrial paced rhythm. She is now feeling back to her usual self. She currently takes flecainide and reports she is compliant. She states this is the first symptomatic recurrence she has had since her PPM implant in 2010. On admission her 12-lead ECG shows a NCT at 176 bpm. Repeat 12-lead ECG once rate was slowed to 95 bpm, shows an ectopic atrial rhythm. Review of her PPM interrogation shows 28 mode switch episodes, 17 AHR, 3 VHR, all but one of which occurred on 12/13/2012. Her EGMs are consistent with an atrial tachycardia with 1:1 conduction. This has resolved and she is currently in an A  paced V sensed rhythm. Her BB dose was increased. She was continued on flecainide. She has been seen, examined and deemed stable for discharge today by Dr. Jolyn Nap. She will follow-up with Dr. Rayann Heman in 2 weeks, as previously scheduled.  Discharge Vitals: Blood pressure 142/82, pulse 63, temperature 97.6 F (36.4 C), temperature source Oral, resp. rate 18, height 6' (1.829 m), weight 230 lb 6.4 oz (104.509 kg), SpO2 98.00%.   Labs: Lab Results  Component Value Date   WBC 3.5* 12/14/2012   HGB 13.2 12/14/2012   HCT 40.5 12/14/2012   MCV 86.0 12/14/2012   PLT 274 12/14/2012     Recent Labs Lab 12/14/12 0500  NA 139  K 4.3  CL 101  CO2 24  BUN 21  CREATININE 1.24*  CALCIUM 9.9  GLUCOSE 130*   Lab Results  Component Value Date   CKTOTAL 24 06/26/2011   CKMB 1.8 06/26/2011   TROPONINI <0.30 12/14/2012     Recent Labs  12/14/12 0500  INR 2.29*    Disposition:  The patient is being discharged in stable condition.  Follow-up:     Follow-up Information   Follow up with [facility] On 12/18/2012. (At 7:45 AM for Coumadin follow-up)    Contact information:   [id number] [place] [address] [phone]      Follow up with Thompson Grayer, MD On 01/01/2013. (At 9:15 AM)    Contact information:   [id number] [place] [address] [phone]      Discharge Medications:    Medication List    TAKE  these medications       anastrozole 1 MG tablet  Commonly known as:  ARIMIDEX  Take 1 tablet (1 mg total) by mouth daily.     bethanechol 25 MG tablet  Commonly known as:  URECHOLINE  Take 25 mg by mouth daily.     celecoxib 200 MG capsule  Commonly known as:  CELEBREX  Take 200 mg by mouth daily as needed for pain.     cyclobenzaprine 10 MG tablet  Commonly known as:  FLEXERIL  Take 10 mg by mouth 3 (three) times daily as needed for muscle spasms.     dicyclomine 10 MG capsule  Commonly known as:  BENTYL    Take 1 capsule (10 mg total) by mouth 2 (two) times daily with a meal.     flecainide 100 MG tablet  Commonly known as:  TAMBOCOR  Take 1 tablet (100 mg total) by mouth 2 (two) times daily.     furosemide 40 MG tablet  Commonly known as:  LASIX  Take 40 mg by mouth daily.     metFORMIN 500 MG (MOD) 24 hr tablet  Commonly known as:  GLUMETZA  Take 500 mg by mouth daily with breakfast.     metoprolol succinate 25 MG 24 hr tablet  Commonly known as:  TOPROL-XL  Take 2 tablets (50 mg total) by mouth daily.     potassium chloride 20 MEQ/15ML (10%) solution  Take 20 mEq by mouth daily.     pyridoxine 100 MG tablet  Commonly known as:  B-6  Take 200 mg by mouth daily.     vitamin B-12 500 MCG tablet  Commonly known as:  CYANOCOBALAMIN  Take 500 mcg by mouth daily.     warfarin 5 MG tablet  Commonly known as:  COUMADIN  Take 2.5-5 mg by mouth daily. Takes 5mg  Sunday and Tuesday. Takes 0.5 tablet (2.5mg ) all other days.       Duration of Discharge Encounter: Greater than 30 minutes including physician time.  Signed, Ileene Hutchinson, PA-C 12/14/2012, 11:32 AM

## 2012-12-14 NOTE — Consult Note (Signed)
ELECTROPHYSIOLOGY CONSULT NOTE  Patient ID: [name] [name] MRN: [id number], DOB/AGE: [birth year]   Admit date: 12/13/2012 Date of Consult: 12/14/2012  Primary Physician: Jeanann Lewandowsky, MD Primary Cardiologist: Thompson Grayer, MD Reason for Consultation: Atrial fibrillation/flutter  History of Present Illness [name] [name] is a pleasant 79 year old woman with HTN, DM, pulmonary hypertension, chronic diastolic HF and atrial fibrillation/flutter and tachy-brady syndrome s/p PPM implant 2010 who presented yesterday morning with SOB, found to have rapid atrial fibrillation, rates in the 170s. She reports her SOB started fairly abruptly yesterday and was persistent followed by lightheadedness after she walked to the bathroom at home, which prompted her to activate her LifeAlert. She was then transported to [facility] via EMS. She also notes bilateral numbness in her fingers that started yesterday as well. She denies CP, palpitations or syncope. While in the ED, she was given IV diltiazem bolus which slowed her rate into the 90s. Her SOB resolved. Overnight she converted to an atrial paced rhythm. She is now feeling back to her usual self. She currently takes flecainide and reports she is compliant. She states this is the first symptomatic recurrence she has had since her PPM implant in 2010.    Past Medical History Past Medical History  Diagnosis Date  . Hypertension   . Diabetes mellitus   . Tachy-brady syndrome     s/p pacer 07/2009  . Atrial fibrillation     flecainide; coumadin  . Atrial flutter     November, 2010, flecainide, Coumadin  . Chronic diastolic heart failure     echo 10/10: mild LVH, EF 55-60%, mild AI, mild MR, mild to mod LAE, mild RVE, severe RAE, mod TR, PASP 53  . Shortness of breath     myoview 11/10: EF 65%, no ischemia  . Pulmonary HTN     53 mmHg echo, 2010, moderate TR, mild right ventricular enlargement  . DVT (deep venous thrombosis)     Pulmonary embolus  . Pulmonary  embolus 2006    2006, with DVT  . Carotid artery disease     [id number]: RICA [id number], LICA [id number]  . Thyroid nodule     non-neoplastic goiter  . Breast cancer   . Ejection fraction     EF 60%, echo, October, 2010  . Warfarin anticoagulation     Atrial fibrillation  . Pacemaker     November, 2010, Dr Rayann Heman - MDT ADDRL1 Adapta DC PPM ser #: [id number]    . S/P laminectomy     June 22, 2011  . Syncope     2010, treated with pacemaker  . Drug therapy     FLECAINIDE  . Mitral regurgitation     mild, echo, October, 2010  . Mediastinal lymphadenopathy   . Venous insufficiency     Chronic  . Hypokalemia     October, 2012, potassium dose increased  . Hiatal hernia 2006    EGD  . Acute gastric ulcer without mention of hemorrhage, perforation, or obstruction 2006    EGD  . Esophagitis, unspecified 2006    EGD  . Allergy     seasonal  . Diverticulosis   . Hx of adenomatous colonic polyps   . Fecal incontinence   . Breast cancer   . Osteopenia     Past Surgical History Past Surgical History  Procedure Laterality Date  . Breast surgery      Left  . Abdominal hysterectomy    . Back surgery      1973, 2012   .  Ankle surgery       Allergies/Intolerances Allergies  Allergen Reactions  . Iohexol Other (See Comments)    unknown    Inpatient Medications . anastrozole  1 mg Oral Daily  . bethanechol  25 mg Oral Daily  . dicyclomine  10 mg Oral BID WC  . flecainide  100 mg Oral BID  . furosemide  40 mg Oral Daily  . insulin aspart  0-15 Units Subcutaneous TID WC  . metoprolol succinate  50 mg Oral Daily  . potassium chloride  20 mEq Oral Daily  . sodium chloride  3 mL Intravenous Q12H  . Warfarin - Pharmacist Dosing Inpatient   Does not apply q1800    Family History Family History  Problem Relation Age of Onset  . Heart disease Mother   . COPD Father   . Colon cancer Neg Hx      Social History Social History  . Marital Status: Widowed   Occupational History   . Retired     Social History Main Topics  . Smoking status: Former Smoker -- 4.00 packs/day    Quit date: 10/26/1998  . Smokeless tobacco: Never Used  . Alcohol Use: No  . Drug Use: No   Social History Narrative   Widowed.  Lives in [place] by herself.  Avoids caffeine.  Very active, taking care of her brother who also lives by himself and is in his late 52's.    Review of Systems General: No chills, fever, night sweats or weight changes  Cardiovascular:  No chest pain, edema, orthopnea, palpitations, paroxysmal nocturnal dyspnea Dermatological: No rash, lesions or masses Respiratory: No cough Urologic: No hematuria, dysuria Abdominal: No nausea, vomiting, diarrhea, bright red blood per rectum, melena, or hematemesis Neurologic: No visual changes, weakness, changes in mental status All other systems reviewed and are otherwise negative except as noted above.  Physical Exam Blood pressure 142/82, pulse 63, temperature 97.6 F (36.4 C), temperature source Oral, resp. rate 18, height 6' (1.829 m), weight 230 lb 6.4 oz (104.509 kg), SpO2 98.00%.  General: Well developed, well appearing 79 year old female in no acute distress. HEENT: Normocephalic, atraumatic. EOMs intact. Sclera nonicteric. Oropharynx clear.  Neck: Supple without bruits. No JVD. Lungs: Respirations regular and unlabored, CTA bilaterally. No wheezes, rales or rhonchi. Heart: RRR. S1, S2 present. No murmurs, rub, S3 or S4. Abdomen: Soft, non-tender, non-distended. BS present x 4 quadrants. No hepatosplenomegaly.  Extremities: No clubbing, cyanosis or edema. DP/PT/Radials 2+ and equal bilaterally. Psych: Normal affect. Neuro: Alert and oriented X 3. Moves all extremities spontaneously. Musculoskeletal: No kyphosis. Skin: Intact. Warm and dry. No rashes or petechiae in exposed areas.   Labs  Recent Labs  12/13/12 0703 12/13/12 1147 12/13/12 1810 12/14/12 0026  TROPONINI <0.30 <0.30 <0.30 <0.30   Lab Results    Component Value Date   WBC 3.5* 12/14/2012   HGB 13.2 12/14/2012   HCT 40.5 12/14/2012   MCV 86.0 12/14/2012   PLT 274 12/14/2012    Recent Labs Lab 12/14/12 0500  NA 139  K 4.3  CL 101  CO2 24  BUN 21  CREATININE 1.24*  CALCIUM 9.9  GLUCOSE 130*    Recent Labs  12/13/12 1147  TSH 0.654    Recent Labs  12/14/12 0500  INR 2.29*    Radiology/Studies Dg Chest Portable 1 View  12/13/2012  *RADIOLOGY REPORT*  Clinical Data: Short of breath  PORTABLE CHEST - 1 VIEW  Comparison: 07/06/2011  Findings: Severe cardiomegaly.  Vascular  congestion.  Central and basilar edema.  Dual lead left subclavian pacemaker device and leads are stable.  No pneumothorax. New right-sided mid level rib deformity has a chronic appearance.  IMPRESSION: CHF with central basilar edema.   Original Report Authenticated By: Marybelle Killings, M.D.     12-lead ECG on admission shows a narrow complex tachycardia at 176 bpm Repeat 12-lead ECG once rate improved to 95 bpm shows an ectopic atrial rhythm with inverted P waves, probable low atrial focus  Telemetry currently shows A paced V sensed rhythm  [employer] [facility] device interrogation yesterday - normal PPM function with good battery status and stable lead measurements; 28 mode switch episodes, 17 AHR, 3 VHR all but 1 episode occurring 12/13/2012; EGMs consistent with an atrial tachycardia with 1:1 conduction    Assessment and Plan 1. Atrial tachycardia 2. PAF 3. Tachy-brady syndrome s/p PPM implant 2010 [name] [name] presents with symptomatic atrial tachycardia. This has resolved. We agree with increasing her BB dose. We recommend continuing AAD therapy with flecainide as previously directed by Dr. Rayann Heman. She is stable for discharge and will keep her scheduled follow-up with Dr. Rayann Heman in 2 weeks.   Signed, Ileene Hutchinson, PA-C 12/14/2012, 10:29 AM

## 2012-12-17 ENCOUNTER — Telehealth: Payer: Self-pay | Admitting: *Deleted

## 2012-12-17 ENCOUNTER — Telehealth: Payer: Self-pay | Admitting: Internal Medicine

## 2012-12-17 NOTE — Telephone Encounter (Signed)
Error

## 2012-12-17 NOTE — Telephone Encounter (Signed)
TCM patient; spoke with patient who states she is doing well; denies any complaints of chest discomfort, SOB, or any other s/s. Patient verbalized understanding of appointment for [facility] tomorrow 3/21 and with Dr. Rayann Heman 4/4.

## 2012-12-18 ENCOUNTER — Ambulatory Visit (INDEPENDENT_AMBULATORY_CARE_PROVIDER_SITE_OTHER): Payer: Medicare Other | Admitting: *Deleted

## 2012-12-18 DIAGNOSIS — I2699 Other pulmonary embolism without acute cor pulmonale: Secondary | ICD-10-CM

## 2012-12-18 DIAGNOSIS — Z7901 Long term (current) use of anticoagulants: Secondary | ICD-10-CM

## 2012-12-18 DIAGNOSIS — I4891 Unspecified atrial fibrillation: Secondary | ICD-10-CM

## 2012-12-18 LAB — POCT INR: INR: 1.7

## 2012-12-22 ENCOUNTER — Telehealth: Payer: Self-pay | Admitting: *Deleted

## 2012-12-22 NOTE — Telephone Encounter (Signed)
Telephoned pt and discussed her nose bleed this AM where she states she called paramedics and they got bleeding stopped. Pt denies any other bleeding, new meds, or GI upset. Pt encouraged to use nasal spray frequently for moisture and to call if bleeding returns.

## 2012-12-24 ENCOUNTER — Encounter (HOSPITAL_COMMUNITY): Payer: Self-pay | Admitting: *Deleted

## 2012-12-24 ENCOUNTER — Inpatient Hospital Stay: Admit: 2012-12-24 | Payer: Self-pay | Admitting: Otolaryngology

## 2012-12-24 ENCOUNTER — Emergency Department (HOSPITAL_COMMUNITY)
Admission: EM | Admit: 2012-12-24 | Discharge: 2012-12-24 | Disposition: A | Discharge status: Home / Self Care | Payer: Medicare Other | Attending: Emergency Medicine | Admitting: Emergency Medicine

## 2012-12-24 DIAGNOSIS — Z9889 Other specified postprocedural states: Secondary | ICD-10-CM | POA: Insufficient documentation

## 2012-12-24 DIAGNOSIS — Z86711 Personal history of pulmonary embolism: Secondary | ICD-10-CM | POA: Insufficient documentation

## 2012-12-24 DIAGNOSIS — R040 Epistaxis: Secondary | ICD-10-CM | POA: Insufficient documentation

## 2012-12-24 DIAGNOSIS — Z86718 Personal history of other venous thrombosis and embolism: Secondary | ICD-10-CM | POA: Insufficient documentation

## 2012-12-24 DIAGNOSIS — I5032 Chronic diastolic (congestive) heart failure: Secondary | ICD-10-CM | POA: Insufficient documentation

## 2012-12-24 DIAGNOSIS — Z860100 Personal history of colon polyps, unspecified: Secondary | ICD-10-CM | POA: Insufficient documentation

## 2012-12-24 DIAGNOSIS — Z8739 Personal history of other diseases of the musculoskeletal system and connective tissue: Secondary | ICD-10-CM | POA: Insufficient documentation

## 2012-12-24 DIAGNOSIS — Z950 Presence of cardiac pacemaker: Secondary | ICD-10-CM | POA: Insufficient documentation

## 2012-12-24 DIAGNOSIS — E119 Type 2 diabetes mellitus without complications: Secondary | ICD-10-CM | POA: Insufficient documentation

## 2012-12-24 DIAGNOSIS — Z862 Personal history of diseases of the blood and blood-forming organs and certain disorders involving the immune mechanism: Secondary | ICD-10-CM | POA: Insufficient documentation

## 2012-12-24 DIAGNOSIS — Z8719 Personal history of other diseases of the digestive system: Secondary | ICD-10-CM | POA: Insufficient documentation

## 2012-12-24 DIAGNOSIS — Z79899 Other long term (current) drug therapy: Secondary | ICD-10-CM | POA: Insufficient documentation

## 2012-12-24 DIAGNOSIS — Z87891 Personal history of nicotine dependence: Secondary | ICD-10-CM | POA: Insufficient documentation

## 2012-12-24 DIAGNOSIS — Z853 Personal history of malignant neoplasm of breast: Secondary | ICD-10-CM | POA: Insufficient documentation

## 2012-12-24 DIAGNOSIS — E876 Hypokalemia: Secondary | ICD-10-CM | POA: Insufficient documentation

## 2012-12-24 DIAGNOSIS — I10 Essential (primary) hypertension: Secondary | ICD-10-CM | POA: Insufficient documentation

## 2012-12-24 DIAGNOSIS — I2510 Atherosclerotic heart disease of native coronary artery without angina pectoris: Secondary | ICD-10-CM | POA: Insufficient documentation

## 2012-12-24 DIAGNOSIS — T45515A Adverse effect of anticoagulants, initial encounter: Secondary | ICD-10-CM | POA: Insufficient documentation

## 2012-12-24 DIAGNOSIS — Z8639 Personal history of other endocrine, nutritional and metabolic disease: Secondary | ICD-10-CM | POA: Insufficient documentation

## 2012-12-24 DIAGNOSIS — Z8679 Personal history of other diseases of the circulatory system: Secondary | ICD-10-CM | POA: Insufficient documentation

## 2012-12-24 DIAGNOSIS — Z7901 Long term (current) use of anticoagulants: Secondary | ICD-10-CM | POA: Insufficient documentation

## 2012-12-24 LAB — POCT I-STAT, CHEM 8
BUN: 22 mg/dL (ref 6–23)
Chloride: 105 mEq/L (ref 96–112)
Creatinine, Ser: 1.1 mg/dL (ref 0.50–1.10)
Glucose, Bld: 238 mg/dL — ABNORMAL HIGH (ref 70–99)
HCT: 38 % (ref 36.0–46.0)
Potassium: 4.5 mEq/L (ref 3.5–5.1)

## 2012-12-24 LAB — CBC
MCH: 29.3 pg (ref 26.0–34.0)
MCHC: 33.5 g/dL (ref 30.0–36.0)
Platelets: 268 10*3/uL (ref 150–400)

## 2012-12-24 SURGERY — CONTROL OF EPISTAXIS, ENDOSCOPIC
Anesthesia: General

## 2012-12-24 MED ORDER — FENTANYL CITRATE 0.05 MG/ML IJ SOLN
25.0000 ug | Freq: Once | INTRAMUSCULAR | Status: AC
  Administered 2012-12-24: 25 ug via INTRAVENOUS
  Filled 2012-12-24: qty 2

## 2012-12-24 MED ORDER — ONDANSETRON HCL 4 MG/2ML IJ SOLN
4.0000 mg | Freq: Once | INTRAMUSCULAR | Status: AC
  Administered 2012-12-24: 4 mg via INTRAVENOUS
  Filled 2012-12-24: qty 2

## 2012-12-24 MED ORDER — AMOXICILLIN-POT CLAVULANATE 875-125 MG PO TABS: 1.0000 | ORAL_TABLET | Freq: Two times a day (BID) | ORAL | Status: DC

## 2012-12-24 NOTE — ED Notes (Signed)
Pt called EMS for a nose bleed that would not stop. Pt on coumadin for clots in her lungs. Pt also has a hx of HTN and took daily medications but BP still elevated. Pt denies pain. Pt has both nostrils bleeding currently with ice and pressure.

## 2012-12-24 NOTE — ED Notes (Signed)
Pt ambulated down hall with [employer] and Therapist, sports. Pt stable. No signs of distress. Pt nose bleed has deceased. Family at bedside. Marnette Burgess seen patient walk with no complications

## 2012-12-24 NOTE — ED Notes (Signed)
Pt resting due to pain meds given. Pt states she is feeling tired. Pt put back in bed and on monitor. Family at bedside. MD notified

## 2012-12-24 NOTE — ED Notes (Signed)
ENT cart at patient bedside

## 2012-12-24 NOTE — ED Provider Notes (Signed)
History     CSN: [id number]  Arrival date & time 12/24/12  0002   First MD Initiated Contact with Patient 12/24/12 0007      Chief Complaint  Patient presents with  . Epistaxis    (Consider location/radiation/quality/duration/timing/severity/associated sxs/prior treatment) HPI Right-sided epistaxis. Onset a few days ago, EMS involved initially and bleeding stopped. EMS was called again today he for persistent bleeding. No new medications. No change in medications. No trauma. No recent cough cold or congestion. No history of the same otherwise. Symptoms moderate to severe. Bleeding persisted despite holding pressure at home. No known aggravating or alleviating factors.  Bleeding persistent since onset tonight  Past Medical History  Diagnosis Date  . Hypertension   . Diabetes mellitus   . Tachy-brady syndrome     s/p pacer 07/2009  . Atrial fibrillation     flecainide; coumadin  . Atrial flutter     November, 2010, flecainide, Coumadin  . Chronic diastolic heart failure     echo 10/10: mild LVH, EF 55-60%, mild AI, mild MR, mild to mod LAE, mild RVE, severe RAE, mod TR, PASP 53  . Shortness of breath     myoview 11/10: EF 65%, no ischemia  . Pulmonary HTN     53 mmHg echo, 2010, moderate TR, mild right ventricular enlargement  . DVT (deep venous thrombosis)     Pulmonary embolus  . Pulmonary embolus 2006    2006, with DVT  . Carotid artery disease     [id number]: RICA [id number], LICA [id number]  . Thyroid nodule     non-neoplastic goiter  . Breast cancer   . Ejection fraction     EF 60%, echo, October, 2010  . Warfarin anticoagulation     Atrial fibrillation  . Pacemaker     November, 2010, Dr Rayann Heman - MDT ADDRL1 Adapta DC PPM ser #: [id number]    . S/P laminectomy     June 22, 2011  . Syncope     2010, treated with pacemaker  . Drug therapy     FLECAINIDE  . Mitral regurgitation     mild, echo, October, 2010  . Mediastinal lymphadenopathy   . Venous insufficiency    Chronic  . Hypokalemia     October, 2012, potassium dose increased  . Hiatal hernia 2006    EGD  . Acute gastric ulcer without mention of hemorrhage, perforation, or obstruction 2006    EGD  . Esophagitis, unspecified 2006    EGD  . Allergy     seasonal  . Diverticulosis   . Hx of adenomatous colonic polyps   . Fecal incontinence   . Breast cancer   . Osteopenia     Past Surgical History  Procedure Laterality Date  . Breast surgery      Left  . Abdominal hysterectomy    . Back surgery      1973, 2012   . Ankle surgery      Family History  Problem Relation Age of Onset  . Heart disease Mother   . COPD Father   . Colon cancer Neg Hx     History  Substance Use Topics  . Smoking status: Former Smoker -- 4.00 packs/day    Quit date: 10/26/1998  . Smokeless tobacco: Never Used  . Alcohol Use: No    OB History   Grav Para Term Preterm Abortions TAB SAB Ect Mult Living  Review of Systems  Constitutional: Negative for fever and chills.  HENT: Positive for nosebleeds. Negative for sore throat, neck pain and neck stiffness.   Eyes: Negative for pain.  Respiratory: Negative for shortness of breath.   Cardiovascular: Negative for chest pain.  Gastrointestinal: Negative for abdominal pain.  Genitourinary: Negative for dysuria.  Musculoskeletal: Negative for back pain.  Skin: Negative for rash.  Neurological: Negative for headaches.  All other systems reviewed and are negative.    Allergies  Iohexol  Home Medications   Current Outpatient Rx  Name  Route  Sig  Dispense  Refill  . anastrozole (ARIMIDEX) 1 MG tablet   Oral   Take 1 tablet (1 mg total) by mouth daily.   90 tablet   3   . bethanechol (URECHOLINE) 25 MG tablet   Oral   Take 25 mg by mouth daily.         Marland Kitchen dicyclomine (BENTYL) 10 MG capsule   Oral   Take 1 capsule (10 mg total) by mouth 2 (two) times daily with a meal.   60 capsule   3   . flecainide (TAMBOCOR) 100 MG  tablet   Oral   Take 100 mg by mouth daily.         . furosemide (LASIX) 40 MG tablet   Oral   Take 40 mg by mouth daily.         . metFORMIN (GLUMETZA) 500 MG (MOD) 24 hr tablet   Oral   Take 500 mg by mouth daily with breakfast.          . metoprolol succinate (TOPROL-XL) 25 MG 24 hr tablet   Oral   Take 2 tablets (50 mg total) by mouth daily.   60 tablet   4   . potassium chloride 20 MEQ/15ML (10%) solution   Oral   Take 20 mEq by mouth daily.         Marland Kitchen pyridoxine (B-6) 100 MG tablet   Oral   Take 200 mg by mouth daily.          . vitamin B-12 (CYANOCOBALAMIN) 500 MCG tablet   Oral   Take 500 mcg by mouth daily.          Marland Kitchen warfarin (COUMADIN) 5 MG tablet   Oral   Take 2.5-5 mg by mouth daily. Takes 5mg  Sunday and Tuesday. Takes 0.5 tablet (2.5mg ) all other days.         . cyclobenzaprine (FLEXERIL) 10 MG tablet   Oral   Take 10 mg by mouth 3 (three) times daily as needed for muscle spasms.            BP 190/70  Pulse 71  Temp(Src) 97.6 F (36.4 C) (Axillary)  Resp 28  SpO2 87%  Physical Exam  Constitutional: She is oriented to person, place, and time. She appears well-developed and well-nourished.  HENT:  Head: Normocephalic and atraumatic.  Right nostril with active bleeding - I'm unable to visualize a bleeding site. Blood clots and left nostril but no active bleeding.   Eyes: EOM are normal. Pupils are equal, round, and reactive to light.  Neck: Neck supple.  Cardiovascular: Normal rate, regular rhythm and intact distal pulses.   Pulmonary/Chest: Effort normal and breath sounds normal. No stridor. No respiratory distress.  Musculoskeletal: Normal range of motion. She exhibits no edema.  Neurological: She is alert and oriented to person, place, and time.  Skin: Skin is warm and dry.    ED  Course  EPISTAXIS MANAGEMENT Date/Time: 12/24/2012 12:15 AM Performed by: Teressa Lower Authorized by: Teressa Lower Consent: Verbal consent  obtained. Risks and benefits: risks, benefits and alternatives were discussed Patient understanding: patient states understanding of the procedure being performed Patient consent: the patient's understanding of the procedure matches consent given Procedure consent: procedure consent matches procedure scheduled Required items: required blood products, implants, devices, and special equipment available Patient identity confirmed: verbally with patient Time out: Immediately prior to procedure a "time out" was called to verify the correct patient, procedure, equipment, support staff and site/side marked as required. Preparation: Patient was prepped and draped in the usual sterile fashion. Treatment site: right posterior Post-procedure assessment: bleeding stopped Treatment complexity: complex Patient tolerance: Patient tolerated the procedure well with no immediate complications. Comments: Right nare cleared with patient blowing her nose.  Cotton ball soaked in Neo-Synephrine applied to right nare - nose clamp. recheck after 5 minutes is still bleeding. Unable to visualize bleeding sites with persistent oozing. Decision made to pack. Consent obtained. Bacitracin applied to tip of packing and posterior packing placed without difficulty.  1:54 AM still some oozing, anterior packing placed.     (including critical care time)  Results for orders placed during the hospital encounter of 12/24/12  CBC      Result Value Range   WBC 6.5  4.0 - 10.5 K/uL   RBC 4.26  3.87 - 5.11 MIL/uL   Hemoglobin 12.5  12.0 - 15.0 g/dL   HCT 37.3  36.0 - 46.0 %   MCV 87.6  78.0 - 100.0 fL   MCH 29.3  26.0 - 34.0 pg   MCHC 33.5  30.0 - 36.0 g/dL   RDW 14.8  11.5 - 15.5 %   Platelets 268  150 - 400 K/uL  PROTIME-INR      Result Value Range   Prothrombin Time 26.1 (*) 11.6 - 15.2 seconds   INR 2.54 (*) 0.00 - 1.49  POCT I-STAT, CHEM 8      Result Value Range   Sodium 139  135 - 145 mEq/L   Potassium 4.5  3.5 -  5.1 mEq/L   Chloride 105  96 - 112 mEq/L   BUN 22  6 - 23 mg/dL   Creatinine, Ser 1.10  0.50 - 1.10 mg/dL   Glucose, Bld 238 (*) 70 - 99 mg/dL   Calcium, Ion 1.20  1.13 - 1.30 mmol/L   TCO2 25  0 - 100 mmol/L   Hemoglobin 12.9  12.0 - 15.0 g/dL   HCT 38.0  36.0 - 46.0 %   IV Zofran. No vomiting.  2:26 AM bleeding stopped. Patient ambulated. Plan discharge home with prescription for Augmentin, hold Coumadin today and discuss with cardiologist for further recommendations. Ear nose and throat referral for followup in the clinic for packing removal. Patient and her daughter bedside are comfortable with this plan - all questions answered. MDM  Right-sided epistaxis on Coumadin.  Epistaxis management as above with posterior packing placed  Labs reviewed as above  IV Zofran for nausea        Teressa Lower, MD 12/24/12 [phone]

## 2012-12-24 NOTE — ED Notes (Signed)
Pt alert and stable to go home with daughter. Pt states she is tired and weak. Pt was told to drink plenty of fluids and follow up the MD. Pt family and pt states they understand all discharge instructions.

## 2012-12-25 ENCOUNTER — Telehealth: Payer: Self-pay | Admitting: *Deleted

## 2012-12-25 NOTE — Telephone Encounter (Signed)
Received call from dtr, patient had nosebleed yesterday, ended up in ED with packing is nose, then saw Dr Simeon Craft an ENT, today she wanted [place] to know pt is on amoxicillin, zofran, OxyContin as directed, instructed her these meds are OK to take with coumadin. Pt has appt next Friday for INR ck.

## 2012-12-28 ENCOUNTER — Telehealth: Payer: Self-pay

## 2012-12-28 NOTE — Telephone Encounter (Signed)
Message from check out desk is as follows:  "12/28/12 Jackelyn Poling, please call this patient daughter about her mother's appointment on tomorrow re:her nose bleeding. [name] [phone]) Thanks".    Mrs Dimauro's daughter was called and she states she just wanted Dr Ron Parker and the coumadin clinic to know that the ENT physician will be cauterizing her nose.   FYI.

## 2013-01-01 ENCOUNTER — Encounter: Payer: Self-pay | Admitting: Cardiology

## 2013-01-01 ENCOUNTER — Ambulatory Visit (INDEPENDENT_AMBULATORY_CARE_PROVIDER_SITE_OTHER): Payer: Medicare Other | Admitting: *Deleted

## 2013-01-01 ENCOUNTER — Encounter: Payer: Self-pay | Admitting: Internal Medicine

## 2013-01-01 ENCOUNTER — Ambulatory Visit (INDEPENDENT_AMBULATORY_CARE_PROVIDER_SITE_OTHER): Payer: Medicare Other | Admitting: Cardiology

## 2013-01-01 VITALS — BP 136/65 | HR 83 | Ht 72.0 in | Wt 232.0 lb

## 2013-01-01 DIAGNOSIS — I4891 Unspecified atrial fibrillation: Secondary | ICD-10-CM

## 2013-01-01 DIAGNOSIS — I498 Other specified cardiac arrhythmias: Secondary | ICD-10-CM

## 2013-01-01 DIAGNOSIS — I471 Supraventricular tachycardia: Secondary | ICD-10-CM

## 2013-01-01 DIAGNOSIS — I2699 Other pulmonary embolism without acute cor pulmonale: Secondary | ICD-10-CM

## 2013-01-01 DIAGNOSIS — Z7901 Long term (current) use of anticoagulants: Secondary | ICD-10-CM

## 2013-01-01 DIAGNOSIS — Z950 Presence of cardiac pacemaker: Secondary | ICD-10-CM

## 2013-01-01 DIAGNOSIS — I4892 Unspecified atrial flutter: Secondary | ICD-10-CM

## 2013-01-01 DIAGNOSIS — I495 Sick sinus syndrome: Secondary | ICD-10-CM

## 2013-01-01 LAB — PACEMAKER DEVICE OBSERVATION
ATRIAL PACING PM: 94.2
BAMS-0001: 175 {beats}/min
RV LEAD AMPLITUDE: 11.2 mv
VENTRICULAR PACING PM: 0

## 2013-01-01 NOTE — Patient Instructions (Signed)
Your physician recommends that you schedule a follow-up appointment in: 6 mths with [facility] physician wants you to follow-up in: 1 year with Dr Rayann Heman Dennis Bast will receive a reminder letter in the mail two months in advance. If you don't receive a letter, please call our office to schedule the follow-up appointment.

## 2013-01-01 NOTE — Progress Notes (Signed)
ELECTROPHYSIOLOGY OFFICE NOTE  Patient ID: Cassandrea [name] MRN: [id number], DOB/AGE: Jun 06, 1931   Date of Visit: 01/01/2013  Primary Physician: Foye Spurling, MD Primary EP: Rayann Heman, MD Reason for Visit: EP/device/hospital follow-up  History of Present Illness  [name] [name] is a pleasant 79 year old woman with HTN, DM, pulmonary hypertension, chronic diastolic HF and atrial fibrillation/flutter and tachy-brady syndrome s/p PPM implant 2010 who presented to [facility] 2 weeks ago with SOB and lightheadedness, found to have a narrow complex tachycardia with rates in the 170s. Her 12-lead ECG showed a NCT at 176 bpm. Repeat 12-lead ECG once rate was slowed to 95 bpm, shows an ectopic atrial rhythm. Review of her PPM interrogation revealed 28 mode switch episodes, 17 AHR, 3 VHR, all but one of which occurred on 12/13/2012. Her EGMs were consistent with an atrial tachycardia with 1:1 conduction. She was evaluated by Dr. Caryl Comes. This resolved and she remained in an A paced V sensed rhythm. Her BB dose was increased. She was continued on flecainide. She was discharged to follow-up today.  Today she reports she is doing well other than fatigue/decreased energy over the last 2 weeks. She specifically denies chest pain or shortness of breath. She denies palpitations, dizziness, near syncope or syncope. She denies LE swelling, orthopnea, PND or recent weight gain. Ms. Carney reports she is compliant with her medications.  Past Medical History Past Medical History  Diagnosis Date  . Hypertension   . Diabetes mellitus   . Tachy-brady syndrome     s/p pacer 07/2009  . Atrial fibrillation     flecainide; coumadin  . Atrial flutter     November, 2010, flecainide, Coumadin  . Chronic diastolic heart failure     echo 10/10: mild LVH, EF 55-60%, mild AI, mild MR, mild to mod LAE, mild RVE, severe RAE, mod TR, PASP 53  . Shortness of breath     myoview 11/10: EF 65%, no ischemia  . Pulmonary HTN     53  mmHg echo, 2010, moderate TR, mild right ventricular enlargement  . DVT (deep venous thrombosis)     Pulmonary embolus  . Pulmonary embolus 2006    2006, with DVT  . Carotid artery disease     [id number]: RICA [id number], LICA 123456  . Thyroid nodule     non-neoplastic goiter  . Breast cancer   . Ejection fraction     EF 60%, echo, October, 2010  . Warfarin anticoagulation     Atrial fibrillation  . Pacemaker     November, 2010, Dr Rayann Heman - MDT ADDRL1 Adapta DC PPM ser #: [id number]    . S/P laminectomy     June 22, 2011  . Syncope     2010, treated with pacemaker  . Drug therapy     FLECAINIDE  . Mitral regurgitation     mild, echo, October, 2010  . Mediastinal lymphadenopathy   . Venous insufficiency     Chronic  . Hypokalemia     October, 2012, potassium dose increased  . Hiatal hernia 2006    EGD  . Acute gastric ulcer without mention of hemorrhage, perforation, or obstruction 2006    EGD  . Esophagitis, unspecified 2006    EGD  . Allergy     seasonal  . Diverticulosis   . Hx of adenomatous colonic polyps   . Fecal incontinence   . Breast cancer   . Osteopenia     Past Surgical History Past Surgical History  Procedure Laterality Date  .  Breast surgery      Left  . Abdominal hysterectomy    . Back surgery      1973, 2012   . Ankle surgery       Allergies/Intolerances Allergies  Allergen Reactions  . Iohexol Other (See Comments)    unknown    Current Home Medications Current Outpatient Prescriptions  Medication Sig Dispense Refill  . amoxicillin-clavulanate (AUGMENTIN) 875-125 MG per tablet Take 1 tablet by mouth 2 (two) times daily.  14 tablet  0  . anastrozole (ARIMIDEX) 1 MG tablet Take 1 tablet (1 mg total) by mouth daily.  90 tablet  3  . bethanechol (URECHOLINE) 25 MG tablet Take 25 mg by mouth daily.      . cyclobenzaprine (FLEXERIL) 10 MG tablet Take 10 mg by mouth 3 (three) times daily as needed for muscle spasms.       Marland Kitchen dicyclomine (BENTYL)  10 MG capsule Take 1 capsule (10 mg total) by mouth 2 (two) times daily with a meal.  60 capsule  3  . flecainide (TAMBOCOR) 100 MG tablet Take 100 mg by mouth daily.      . furosemide (LASIX) 40 MG tablet Take 40 mg by mouth daily.      . metFORMIN (GLUMETZA) 500 MG (MOD) 24 hr tablet Take 500 mg by mouth daily with breakfast.       . metoprolol succinate (TOPROL-XL) 25 MG 24 hr tablet Take 2 tablets (50 mg total) by mouth daily.  60 tablet  4  . potassium chloride 20 MEQ/15ML (10%) solution Take 20 mEq by mouth daily.      Marland Kitchen pyridoxine (B-6) 100 MG tablet Take 200 mg by mouth daily.       . vitamin B-12 (CYANOCOBALAMIN) 500 MCG tablet Take 500 mcg by mouth daily.       Marland Kitchen warfarin (COUMADIN) 5 MG tablet Take 2.5-5 mg by mouth daily. Takes 5mg  Sunday and Tuesday. Takes 0.5 tablet (2.5mg ) all other days.       No current facility-administered medications for this visit.    Social History Social History  . Marital Status: Widowed   Social History Main Topics  . Smoking status: Former Smoker -- 4.00 packs/day    Quit date: 10/26/1998  . Smokeless tobacco: Never Used  . Alcohol Use: No  . Drug Use: No   Social History Narrative   Widowed.  Lives in [place] by herself.  Avoids caffeine.  Very active, taking care of her brother who also lives by himself and is in his late 86's.    Review of Systems General: No chills, fever, night sweats or weight changes Cardiovascular: No chest pain, dyspnea on exertion, edema, orthopnea, palpitations, paroxysmal nocturnal dyspnea Dermatological: No rash, lesions or masses Respiratory: No cough, dyspnea Urologic: No hematuria, dysuria Abdominal: No nausea, vomiting, diarrhea, bright red blood per rectum, melena, or hematemesis Neurologic: No visual changes, weakness, changes in mental status All other systems reviewed and are otherwise negative except as noted above.  Physical Exam Blood pressure 136/65, pulse 83, height 6' (1.829 m), weight 232 lb  (105.235 kg), SpO2 98.00%.  General: Well developed, well appearing 79 year old female in no acute distress. HEENT: Normocephalic, atraumatic. EOMs intact. Sclera nonicteric. Oropharynx clear.  Neck: Supple without bruits. No JVD. Lungs: Respirations regular and unlabored, CTA bilaterally. No wheezes, rales or rhonchi. Heart: RRR. S1, S2 present. No murmurs, rub, S3 or S4. Abdomen: Soft, non-tender, non-distended. BS present x 4 quadrants. No hepatosplenomegaly.  Extremities:  No clubbing, cyanosis or edema. DP/PT/Radials 2+ and equal bilaterally. Psych: Normal affect. Neuro: Alert and oriented X 3. Moves all extremities spontaneously.   Diagnostics Device interrogation today - Normal device function. Thresholds, sensing, impedances consistent with previous measurements. Device programmed to maximize longevity. 31 mode switches, 17 AHR, 3 VHR episodes all but one occurring on 12/13/2012 at which time pt was in hospital. EGMs consistent with an atrial tachycardia with 1:1 conduction. No episodes documented since hospital DC on 12/14/2012. Device programmed at appropriate safety margins. Histogram distribution appropriate for patient activity level. Device programmed to optimize intrinsic conduction. Estimated longevity 10.5 years.  Assessment and Plan 1. Tachy-brady syndrome s/p PPM 2. PAT 3. PAF 4. Chronic diastolic HF Ms. Allingham is doing well today. On device interrogation, she has had no recurrent arrhythmias. Her PPM function is normal. Her histograms and rate response are appropriate. We discussed her fatigue and the possibility of medication side effect with metoprolol. She was offered options of decreasing her dose or switching her regimen to CCB; however, at this time, she would like to continue her current regimen. There were no programming changes made today. She will return to device clinic in 6 months for routine PPM follow-up. She will return to clinic for follow-up with Dr. Rayann Heman in  one year.    Signed, Ileene Hutchinson, PA-C 01/01/2013, 9:15 AM

## 2013-01-15 ENCOUNTER — Other Ambulatory Visit: Payer: Self-pay

## 2013-01-15 ENCOUNTER — Ambulatory Visit (INDEPENDENT_AMBULATORY_CARE_PROVIDER_SITE_OTHER): Payer: Medicare Other | Admitting: *Deleted

## 2013-01-15 DIAGNOSIS — Z7901 Long term (current) use of anticoagulants: Secondary | ICD-10-CM

## 2013-01-15 DIAGNOSIS — Z9012 Acquired absence of left breast and nipple: Secondary | ICD-10-CM

## 2013-01-15 DIAGNOSIS — Z1231 Encounter for screening mammogram for malignant neoplasm of breast: Secondary | ICD-10-CM

## 2013-01-15 DIAGNOSIS — I2699 Other pulmonary embolism without acute cor pulmonale: Secondary | ICD-10-CM

## 2013-01-15 DIAGNOSIS — I4891 Unspecified atrial fibrillation: Secondary | ICD-10-CM

## 2013-01-19 ENCOUNTER — Telehealth: Payer: Self-pay | Admitting: *Deleted

## 2013-01-19 NOTE — Telephone Encounter (Signed)
Pt called stating had nose bleed this am was not as bad as before and she was able to get the bleeding stopped but called Ear Nose Throat  Dr Simeon Craft and his nurse states when on long term coumadin may have these bleeds. This nurse informed pt that her INR was 2.4 on 01/15/2013 and that she is in range and that the coumadin is preventing her from clots and possible stroke DVT or other problems and that being on coumadin does not necessarily mean that you will have nose bleeds. Instructed to call ENT back if nose bleeds continues and she states understanding.

## 2013-01-25 ENCOUNTER — Telehealth: Payer: Self-pay | Admitting: *Deleted

## 2013-01-25 NOTE — Telephone Encounter (Signed)
Verbal Order given to Magda Paganini a Second to [place], per Dr. Lamonte Sakai, for pt to have Mastectomy supplies, including prostetics.  They will fax over order for Dr. Agustina Caroli signature.

## 2013-02-12 ENCOUNTER — Ambulatory Visit (INDEPENDENT_AMBULATORY_CARE_PROVIDER_SITE_OTHER): Payer: Medicare Other | Admitting: *Deleted

## 2013-02-12 DIAGNOSIS — Z7901 Long term (current) use of anticoagulants: Secondary | ICD-10-CM

## 2013-02-12 DIAGNOSIS — I4891 Unspecified atrial fibrillation: Secondary | ICD-10-CM

## 2013-02-12 DIAGNOSIS — I2699 Other pulmonary embolism without acute cor pulmonale: Secondary | ICD-10-CM

## 2013-02-15 ENCOUNTER — Ambulatory Visit: Payer: Medicare Other

## 2013-02-16 ENCOUNTER — Ambulatory Visit
Admission: RE | Admit: 2013-02-16 | Discharge: 2013-02-16 | Disposition: A | Discharge status: Home / Self Care | Payer: Medicare Other | Source: Ambulatory Visit

## 2013-02-16 DIAGNOSIS — Z9012 Acquired absence of left breast and nipple: Secondary | ICD-10-CM

## 2013-02-16 DIAGNOSIS — Z1231 Encounter for screening mammogram for malignant neoplasm of breast: Secondary | ICD-10-CM

## 2013-02-17 ENCOUNTER — Other Ambulatory Visit: Payer: Self-pay | Admitting: Internal Medicine

## 2013-02-17 DIAGNOSIS — R928 Other abnormal and inconclusive findings on diagnostic imaging of breast: Secondary | ICD-10-CM

## 2013-02-19 ENCOUNTER — Ambulatory Visit (INDEPENDENT_AMBULATORY_CARE_PROVIDER_SITE_OTHER): Payer: Medicare Other | Admitting: *Deleted

## 2013-02-19 DIAGNOSIS — I2699 Other pulmonary embolism without acute cor pulmonale: Secondary | ICD-10-CM

## 2013-02-19 DIAGNOSIS — Z7901 Long term (current) use of anticoagulants: Secondary | ICD-10-CM

## 2013-02-19 DIAGNOSIS — I4891 Unspecified atrial fibrillation: Secondary | ICD-10-CM

## 2013-02-19 LAB — POCT INR: INR: 1.8

## 2013-02-25 ENCOUNTER — Ambulatory Visit
Admission: RE | Admit: 2013-02-25 | Discharge: 2013-02-25 | Disposition: A | Discharge status: Home / Self Care | Payer: Medicare Other | Source: Ambulatory Visit | Attending: Internal Medicine | Admitting: Internal Medicine

## 2013-02-25 DIAGNOSIS — R928 Other abnormal and inconclusive findings on diagnostic imaging of breast: Secondary | ICD-10-CM

## 2013-03-02 ENCOUNTER — Telehealth: Payer: Self-pay

## 2013-03-02 NOTE — Telephone Encounter (Signed)
**Note De-Identified  Obfuscation** Pt c/o swelling in ankles and wants to be seen. Pt given an appt to see Richardson Dopp, PA-C on 6/18 @ 8:50, she verbalized understanding.

## 2013-03-05 ENCOUNTER — Ambulatory Visit (INDEPENDENT_AMBULATORY_CARE_PROVIDER_SITE_OTHER): Payer: Medicare Other | Admitting: *Deleted

## 2013-03-05 DIAGNOSIS — Z7901 Long term (current) use of anticoagulants: Secondary | ICD-10-CM

## 2013-03-05 DIAGNOSIS — I2699 Other pulmonary embolism without acute cor pulmonale: Secondary | ICD-10-CM

## 2013-03-05 DIAGNOSIS — I4891 Unspecified atrial fibrillation: Secondary | ICD-10-CM

## 2013-03-17 ENCOUNTER — Ambulatory Visit (INDEPENDENT_AMBULATORY_CARE_PROVIDER_SITE_OTHER): Payer: Medicare Other | Admitting: *Deleted

## 2013-03-17 ENCOUNTER — Ambulatory Visit (INDEPENDENT_AMBULATORY_CARE_PROVIDER_SITE_OTHER): Payer: Medicare Other | Admitting: Physician Assistant

## 2013-03-17 ENCOUNTER — Encounter: Payer: Self-pay | Admitting: Physician Assistant

## 2013-03-17 VITALS — BP 142/88 | HR 66 | Ht 72.0 in | Wt 239.2 lb

## 2013-03-17 DIAGNOSIS — R609 Edema, unspecified: Secondary | ICD-10-CM

## 2013-03-17 DIAGNOSIS — R0609 Other forms of dyspnea: Secondary | ICD-10-CM

## 2013-03-17 DIAGNOSIS — R0989 Other specified symptoms and signs involving the circulatory and respiratory systems: Secondary | ICD-10-CM

## 2013-03-17 DIAGNOSIS — M25561 Pain in right knee: Secondary | ICD-10-CM

## 2013-03-17 DIAGNOSIS — I5032 Chronic diastolic (congestive) heart failure: Secondary | ICD-10-CM

## 2013-03-17 DIAGNOSIS — I4891 Unspecified atrial fibrillation: Secondary | ICD-10-CM

## 2013-03-17 DIAGNOSIS — I498 Other specified cardiac arrhythmias: Secondary | ICD-10-CM

## 2013-03-17 DIAGNOSIS — I471 Supraventricular tachycardia: Secondary | ICD-10-CM

## 2013-03-17 DIAGNOSIS — Z7901 Long term (current) use of anticoagulants: Secondary | ICD-10-CM

## 2013-03-17 DIAGNOSIS — M25569 Pain in unspecified knee: Secondary | ICD-10-CM

## 2013-03-17 DIAGNOSIS — I2699 Other pulmonary embolism without acute cor pulmonale: Secondary | ICD-10-CM

## 2013-03-17 DIAGNOSIS — I10 Essential (primary) hypertension: Secondary | ICD-10-CM

## 2013-03-17 LAB — BASIC METABOLIC PANEL
BUN: 16 mg/dL (ref 6–23)
GFR: 51.83 mL/min — ABNORMAL LOW (ref >60.00)
Glucose, Bld: 140 mg/dL — ABNORMAL HIGH (ref 70–99)
Potassium: 3.6 mEq/L (ref 3.5–5.1)

## 2013-03-17 NOTE — Patient Instructions (Addendum)
FOR THE NEXT 5 DAYS INCREASE LASIX TO 40 MG TWICE DAILY  FOR THE NEXT 5 DAYS INCREASE POTASSIUM 20 MEQ TWICE DAILY  AFTER 5 DAYS THEN RESUME YOUR REGULAR DOSE OF LASIX AND POTASSIUM  WEIGH DAILY AND IF YOU SEE YOUR WEIGH IS INCREASED BY 3 LB IN 1 DAY OK THEN TO TAKE EXTRA LASIX 40 MG AND CALL THE OFFICE TO ADVISE [place]  [id number]  KEEP YOUR APPT WITH DR. KATZ 04/28/13  LAB TODAY BMET, BNP  REPEAT BMET 03/19/13

## 2013-03-17 NOTE — Progress Notes (Signed)
[facility]. [address] [place], [place]  [address] Phone: [phone] Fax:  [phone]  Date:  03/17/2013   ID:  [name] [name], DOB Jul 25, 1931, MRN [id number]  PCP:  Foye Spurling, MD  Cardiologist:  Dr. Cleatis Polka  Electrophysiologist:  Dr. Thompson Grayer    History of Present Illness: [name] [name] is a 79 y.o. female who returns for evaluation of edema.  She has a hx of atrial fibrillation/flutter, controlled on flecainide, tachybradycardia syndrome, s/p pacemaker implantation [id number], mild diastolic dysfunction, pulmonary HTN, HTN, DVT with pulmonary embolism in the past, DM2, goiter, breast CA and carotid stenosis, s/p lumbar laminectomy in 2012 for spinal stenosis. Myoview 11/10: No ischemia, EF 65%. Carotid Dopplers [id number]: RICA [id number], LICA 123456.  Echo 3/14:  Mild LVH, EF 55%, Tr AI, MAC, mild MR, mod LAE, PASP 31, trivial eff.  She was admitted in 11/2012 with shortness of breath and lightheadedness in the setting of narrow complex tachycardia with HR 170s. Findings are felt to be consistent with atrial tachycardia. Beta blocker dose was increased. She was last seen in this office by Ileene Hutchinson, PA-C in post hospital f/u.    Over the last several weeks, she has noted increased ankle edema and slightly worsening DOE.  She probably describes NYHA Class IIb symptoms.  She sleeps on 2 pillows.  No PND.  No CP.  No syncope.  No palpitations.   Labs (9/13):  K 4.4, Cr 1.5, ALT 16 Labs (3/14):  K 4.5, Cr 1.10, Hgb 12.9, TSH 0.654  Lab Results  Component Value Date   INR 2.3 03/17/2013   INR 2.2 03/05/2013   INR 1.8 02/19/2013    Wt Readings from Last 3 Encounters:  03/17/13 239 lb 3.2 oz (108.5 kg)  01/01/13 232 lb (105.235 kg)  12/14/12 230 lb 6.4 oz (104.509 kg)     Past Medical History  Diagnosis Date  . Hypertension   . Diabetes mellitus   . Tachy-brady syndrome     s/p pacer 07/2009  . Atrial fibrillation     flecainide; coumadin  . Atrial flutter      November, 2010, flecainide, Coumadin  . Chronic diastolic heart failure     echo 10/10: mild LVH, EF 55-60%, mild AI, mild MR, mild to mod LAE, mild RVE, severe RAE, mod TR, PASP 53;  Echo 3/14:  Mild LVH, EF 55%, Tr AI, MAC, mild MR, mod LAE, PASP 31, trivial eff.  . Shortness of breath     myoview 11/10: EF 65%, no ischemia  . Pulmonary HTN     53 mmHg echo, 2010, moderate TR, mild right ventricular enlargement  . DVT (deep venous thrombosis)     Pulmonary embolus  . Pulmonary embolus 2006    2006, with DVT  . Carotid artery disease     [id number]: RICA [id number], LICA 123456  . Thyroid nodule     non-neoplastic goiter  . Breast cancer   . Ejection fraction     EF 60%, echo, October, 2010  . Warfarin anticoagulation     Atrial fibrillation  . Pacemaker     November, 2010, Dr Rayann Heman - MDT ADDRL1 Adapta DC PPM ser #: [id number]    . S/P laminectomy     June 22, 2011  . Syncope     2010, treated with pacemaker  . Drug therapy     FLECAINIDE  . Mitral regurgitation     mild, echo, October, 2010  . Mediastinal lymphadenopathy   .  Venous insufficiency     Chronic  . Hypokalemia     October, 2012, potassium dose increased  . Hiatal hernia 2006    EGD  . Acute gastric ulcer without mention of hemorrhage, perforation, or obstruction 2006    EGD  . Esophagitis, unspecified 2006    EGD  . Allergy     seasonal  . Diverticulosis   . Hx of adenomatous colonic polyps   . Fecal incontinence   . Breast cancer   . Osteopenia     Current Outpatient Prescriptions  Medication Sig Dispense Refill  . anastrozole (ARIMIDEX) 1 MG tablet Take 1 tablet (1 mg total) by mouth daily.  90 tablet  3  . cyclobenzaprine (FLEXERIL) 10 MG tablet Take 10 mg by mouth 3 (three) times daily as needed for muscle spasms.       Marland Kitchen dicyclomine (BENTYL) 10 MG capsule Take 1 capsule (10 mg total) by mouth 2 (two) times daily with a meal.  60 capsule  3  . flecainide (TAMBOCOR) 100 MG tablet Take 100 mg by  mouth daily.      . furosemide (LASIX) 40 MG tablet Take 40 mg by mouth daily.      . metFORMIN (GLUMETZA) 500 MG (MOD) 24 hr tablet Take 500 mg by mouth daily with breakfast.       . metoprolol succinate (TOPROL-XL) 25 MG 24 hr tablet Take 25 mg by mouth daily.      . potassium chloride 20 MEQ/15ML (10%) solution Take 20 mEq by mouth daily.      Marland Kitchen pyridoxine (B-6) 100 MG tablet Take 200 mg by mouth daily.       . vitamin B-12 (CYANOCOBALAMIN) 500 MCG tablet Take 500 mcg by mouth daily.       Marland Kitchen warfarin (COUMADIN) 5 MG tablet Take 2.5-5 mg by mouth daily. Takes 5mg  Sunday and Tuesday. Takes 0.5 tablet (2.5mg ) all other days.       No current facility-administered medications for this visit.    Allergies:    Allergies  Allergen Reactions  . Iohexol Other (See Comments)    unknown    Social History:  The patient  reports that she quit smoking about 14 years ago. She has never used smokeless tobacco. She reports that she does not drink alcohol or use illicit drugs.   ROS:  Please see the history of present illness.  She has right knee pain and swelling as well.   All other systems reviewed and negative.   PHYSICAL EXAM: VS:  BP 142/88  Pulse 66  Ht 6' (1.829 m)  Wt 239 lb 3.2 oz (108.5 kg)  BMI 32.43 kg/m2 Well nourished, well developed, in no acute distress HEENT: normal Neck: no JVD Cardiac:  normal S1, S2; RRR; 2/6 systolic murmur at the apex Lungs:  clear to auscultation bilaterally, no wheezing, rhonchi or rales Abd: soft, nontender, no hepatomegaly Ext: 1-2+ bilat ankle and LE edema Skin: warm and dry MSK:  Right knee with + effusion Neuro:  CNs 2-12 intact, no focal abnormalities noted  EKG:  A paced, HR 66     ASSESSMENT AND PLAN:  1. Chronic Diastolic CHF:  She has some volume overload.  I will increase her Lasix and K+ to bid for the next 5 days.  Check BMET and BNP today and repeat in 1 week.  We discussed the importance of weighing daily.  If she notes 3lb weight  increase in 1 day, she will take extra  Lasix and call us.   2. Atrial Fibrillation:  Maintaining NSR.  She remains on coumadin. 3. Atrial Tachycardia:  No apparent recurrence. 4. Hypertension:  Controlled.  Continue current therapy.  5. Knee Pain:  Suspect she has significant DJD.  I have recommended she follow up with her PCP.   6. Disposition:  Keep f/u with Dr. Cleatis Polka on 7/30 or return sooner prn.  Signed, Richardson Dopp, PA-C  03/17/2013 9:14 AM

## 2013-03-18 ENCOUNTER — Telehealth: Payer: Self-pay | Admitting: Cardiology

## 2013-03-18 ENCOUNTER — Other Ambulatory Visit: Payer: Self-pay | Admitting: Physician Assistant

## 2013-03-18 NOTE — Telephone Encounter (Signed)
lvm with family member to have pt's daughter cb in reference to her call about pt's lasix dose. see pt instructions from [place]  6/18 ov.

## 2013-03-18 NOTE — Telephone Encounter (Signed)
New Problem:    Called in because her mother was not taking her furosemide (LASIX) 40 MG tablet when she had her appointment yesterday in regards to her swelling.  Patient was told to double her dose but patient's daughter wanted to know if she just needed to resume taking her normal 40mg  dose.  Please call back.

## 2013-03-18 NOTE — Telephone Encounter (Signed)
lvm with family member to have pt's daughter cb in reference to her call about pt's lasix dose. see pt instructions from [place]  6/18 ov.

## 2013-03-18 NOTE — Telephone Encounter (Signed)
s/w pt's daughter [name] about pt's lasix dose. daughter states pt has not been taking her lasix since 05/2012, but was still taking K+. Ruzie asked could pt take lasix qd and not bid x 5 dys. I said no, went over the instructions for K+/lasix see ov 03/17/13. Daughter [name] verbalized understanding to me today about the medication dose changes for lasiz and K+ x 5 days; then resume previous doses after the 5 days. bmet 03/26/13

## 2013-03-20 ENCOUNTER — Encounter (HOSPITAL_COMMUNITY): Payer: Self-pay

## 2013-03-20 ENCOUNTER — Encounter: Payer: Self-pay | Admitting: Cardiology

## 2013-03-20 ENCOUNTER — Emergency Department (HOSPITAL_COMMUNITY): Payer: Medicare Other

## 2013-03-20 ENCOUNTER — Emergency Department (HOSPITAL_COMMUNITY)
Admission: EM | Admit: 2013-03-20 | Discharge: 2013-03-20 | Disposition: A | Discharge status: Home / Self Care | Payer: Medicare Other | Attending: Emergency Medicine | Admitting: Emergency Medicine

## 2013-03-20 DIAGNOSIS — Z8719 Personal history of other diseases of the digestive system: Secondary | ICD-10-CM | POA: Insufficient documentation

## 2013-03-20 DIAGNOSIS — I10 Essential (primary) hypertension: Secondary | ICD-10-CM | POA: Insufficient documentation

## 2013-03-20 DIAGNOSIS — Z87891 Personal history of nicotine dependence: Secondary | ICD-10-CM | POA: Insufficient documentation

## 2013-03-20 DIAGNOSIS — I4891 Unspecified atrial fibrillation: Secondary | ICD-10-CM | POA: Insufficient documentation

## 2013-03-20 DIAGNOSIS — Z862 Personal history of diseases of the blood and blood-forming organs and certain disorders involving the immune mechanism: Secondary | ICD-10-CM | POA: Insufficient documentation

## 2013-03-20 DIAGNOSIS — E119 Type 2 diabetes mellitus without complications: Secondary | ICD-10-CM | POA: Insufficient documentation

## 2013-03-20 DIAGNOSIS — R000 Tachycardia, unspecified: Secondary | ICD-10-CM | POA: Insufficient documentation

## 2013-03-20 DIAGNOSIS — I495 Sick sinus syndrome: Secondary | ICD-10-CM | POA: Insufficient documentation

## 2013-03-20 DIAGNOSIS — Z860100 Personal history of colon polyps, unspecified: Secondary | ICD-10-CM | POA: Insufficient documentation

## 2013-03-20 DIAGNOSIS — Z7901 Long term (current) use of anticoagulants: Secondary | ICD-10-CM | POA: Insufficient documentation

## 2013-03-20 DIAGNOSIS — M7989 Other specified soft tissue disorders: Secondary | ICD-10-CM | POA: Insufficient documentation

## 2013-03-20 DIAGNOSIS — R609 Edema, unspecified: Secondary | ICD-10-CM | POA: Insufficient documentation

## 2013-03-20 DIAGNOSIS — Z8639 Personal history of other endocrine, nutritional and metabolic disease: Secondary | ICD-10-CM | POA: Insufficient documentation

## 2013-03-20 DIAGNOSIS — Z950 Presence of cardiac pacemaker: Secondary | ICD-10-CM | POA: Insufficient documentation

## 2013-03-20 DIAGNOSIS — Z8669 Personal history of other diseases of the nervous system and sense organs: Secondary | ICD-10-CM | POA: Insufficient documentation

## 2013-03-20 DIAGNOSIS — Z853 Personal history of malignant neoplasm of breast: Secondary | ICD-10-CM | POA: Insufficient documentation

## 2013-03-20 DIAGNOSIS — Z86718 Personal history of other venous thrombosis and embolism: Secondary | ICD-10-CM | POA: Insufficient documentation

## 2013-03-20 DIAGNOSIS — Z79899 Other long term (current) drug therapy: Secondary | ICD-10-CM | POA: Insufficient documentation

## 2013-03-20 DIAGNOSIS — Z9889 Other specified postprocedural states: Secondary | ICD-10-CM | POA: Insufficient documentation

## 2013-03-20 DIAGNOSIS — Z86711 Personal history of pulmonary embolism: Secondary | ICD-10-CM | POA: Insufficient documentation

## 2013-03-20 DIAGNOSIS — I5032 Chronic diastolic (congestive) heart failure: Secondary | ICD-10-CM | POA: Insufficient documentation

## 2013-03-20 DIAGNOSIS — E876 Hypokalemia: Secondary | ICD-10-CM | POA: Insufficient documentation

## 2013-03-20 DIAGNOSIS — Z8679 Personal history of other diseases of the circulatory system: Secondary | ICD-10-CM | POA: Insufficient documentation

## 2013-03-20 DIAGNOSIS — I2510 Atherosclerotic heart disease of native coronary artery without angina pectoris: Secondary | ICD-10-CM | POA: Insufficient documentation

## 2013-03-20 LAB — CBC WITH DIFFERENTIAL/PLATELET
Basophils Relative: 2 % — ABNORMAL HIGH (ref 0–1)
Eosinophils Absolute: 0.1 10*3/uL (ref 0.0–0.7)
HCT: 40.5 % (ref 36.0–46.0)
Hemoglobin: 13.6 g/dL (ref 12.0–15.0)
Lymphs Abs: 0.9 10*3/uL (ref 0.7–4.0)
MCH: 28.4 pg (ref 26.0–34.0)
MCHC: 33.6 g/dL (ref 30.0–36.0)
Monocytes Absolute: 0.5 10*3/uL (ref 0.1–1.0)
Neutro Abs: 3.3 10*3/uL (ref 1.7–7.7)

## 2013-03-20 LAB — URINALYSIS, ROUTINE W REFLEX MICROSCOPIC
Bilirubin Urine: NEGATIVE
Hgb urine dipstick: NEGATIVE
Leukocytes, UA: NEGATIVE
Specific Gravity, Urine: 1.012 (ref 1.005–1.030)
pH: 5.5 (ref 5.0–8.0)

## 2013-03-20 LAB — POCT I-STAT, CHEM 8
BUN: 20 mg/dL (ref 6–23)
Calcium, Ion: 1.17 mmol/L (ref 1.13–1.30)
Chloride: 100 mEq/L (ref 96–112)
Glucose, Bld: 272 mg/dL — ABNORMAL HIGH (ref 70–99)
TCO2: 24 mmol/L (ref 0–100)

## 2013-03-20 LAB — CG4 I-STAT (LACTIC ACID): Lactic Acid, Venous: 3.45 mmol/L — ABNORMAL HIGH (ref 0.5–2.2)

## 2013-03-20 LAB — MAGNESIUM: Magnesium: 1.6 mg/dL (ref 1.5–2.5)

## 2013-03-20 MED ORDER — METOPROLOL SUCCINATE ER 25 MG PO TB24: 25.0000 mg | ORAL_TABLET | Freq: Two times a day (BID) | ORAL | Status: DC

## 2013-03-20 MED ORDER — DILTIAZEM HCL 25 MG/5ML IV SOLN
5.0000 mg | Freq: Once | INTRAVENOUS | Status: AC
  Administered 2013-03-20: 5 mg via INTRAVENOUS
  Filled 2013-03-20: qty 5

## 2013-03-20 NOTE — Progress Notes (Signed)
   I was called about the patient in the emergency room today. She developed rapid tachycardia early this morning. She came in the emergency room and was given some diltiazem. Her rate slowed. Her pacemaker was interrogated. Ultimately it appeared that she converted back to a paced rhythm. She was stable. Over the telephone I decided with the emergency room physician for her to be a will go home. I increased her beta blocker dose. We will have to decide if she should in addition be on a calcium blocker.[email or web address]

## 2013-03-20 NOTE — ED Notes (Signed)
bs 98

## 2013-03-20 NOTE — ED Notes (Signed)
Atrial paced 

## 2013-03-20 NOTE — ED Notes (Addendum)
Patient alert and oriented but slightly lethargic. Breathing slightly labored

## 2013-03-20 NOTE — ED Provider Notes (Signed)
History     CSN: [id number]  Arrival date & time 03/20/13  [id number]   First MD Initiated Contact with Patient 03/20/13 [id number]      Chief Complaint  Patient presents with  . Shortness of Breath    (Consider location/radiation/quality/duration/timing/severity/associated sxs/prior treatment) The history is provided by the patient.   patient presents with shortness of breath this morning. She states that she woke with it. Recently seen by [facility] cards and had lasix increased for peripheral edema. Patient has a history of atrial fibrillation bradycardia syndrome. She has had atrial tachycardia. She is on flecainide and Coumadin. She is also on metoprolol. No fevers. No dysuria. No Nausea Or vomiting. She states the swelling in her legs is better with the Lasix.  Past Medical History  Diagnosis Date  . Hypertension   . Diabetes mellitus   . Tachy-brady syndrome     s/p pacer 07/2009  . Atrial fibrillation     flecainide; coumadin  . Atrial flutter     November, 2010, flecainide, Coumadin  . Chronic diastolic heart failure     echo 10/10: mild LVH, EF 55-60%, mild AI, mild MR, mild to mod LAE, mild RVE, severe RAE, mod TR, PASP 53;  Echo 3/14:  Mild LVH, EF 55%, Tr AI, MAC, mild MR, mod LAE, PASP 31, trivial eff.  . Shortness of breath     myoview 11/10: EF 65%, no ischemia  . Pulmonary HTN     53 mmHg echo, 2010, moderate TR, mild right ventricular enlargement  . DVT (deep venous thrombosis)     Pulmonary embolus  . Pulmonary embolus 2006    2006, with DVT  . Carotid artery disease     [id number]: RICA [id number], LICA [id number]  . Thyroid nodule     non-neoplastic goiter  . Breast cancer   . Ejection fraction     EF 60%, echo, October, 2010  . Warfarin anticoagulation     Atrial fibrillation  . Pacemaker     November, 2010, Dr Rayann Heman - MDT ADDRL1 Adapta DC PPM ser #: [id number]    . S/P laminectomy     June 22, 2011  . Syncope     2010, treated with pacemaker  . Drug therapy    FLECAINIDE  . Mitral regurgitation     mild, echo, October, 2010  . Mediastinal lymphadenopathy   . Venous insufficiency     Chronic  . Hypokalemia     October, 2012, potassium dose increased  . Hiatal hernia 2006    EGD  . Acute gastric ulcer without mention of hemorrhage, perforation, or obstruction 2006    EGD  . Esophagitis, unspecified 2006    EGD  . Allergy     seasonal  . Diverticulosis   . Hx of adenomatous colonic polyps   . Fecal incontinence   . Breast cancer   . Osteopenia     Past Surgical History  Procedure Laterality Date  . Breast surgery      Left  . Abdominal hysterectomy    . Back surgery      1973, 2012   . Ankle surgery      Family History  Problem Relation Age of Onset  . Heart disease Mother   . COPD Father   . Colon cancer Neg Hx     History  Substance Use Topics  . Smoking status: Former Smoker -- 4.00 packs/day    Quit date: 10/26/1998  . Smokeless tobacco: Never Used  .  Alcohol Use: No    OB History   Grav Para Term Preterm Abortions TAB SAB Ect Mult Living                  Review of Systems  Constitutional: Negative for activity change and appetite change.  HENT: Negative for neck stiffness.   Eyes: Negative for pain.  Respiratory: Positive for shortness of breath. Negative for chest tightness.   Cardiovascular: Positive for leg swelling.  Gastrointestinal: Negative for nausea, vomiting, abdominal pain and diarrhea.  Genitourinary: Negative for flank pain.  Musculoskeletal: Negative for back pain.  Skin: Negative for rash.  Neurological: Negative for weakness, numbness and headaches.  Psychiatric/Behavioral: Negative for behavioral problems.    Allergies  Iohexol  Home Medications   Current Outpatient Rx  Name  Route  Sig  Dispense  Refill  . anastrozole (ARIMIDEX) 1 MG tablet   Oral   Take 1 tablet (1 mg total) by mouth daily.   90 tablet   3   . bethanechol (URECHOLINE) 25 MG tablet   Oral   Take 25 mg by  mouth daily.         . cyclobenzaprine (FLEXERIL) 10 MG tablet   Oral   Take 10 mg by mouth 3 (three) times daily as needed for muscle spasms.          Marland Kitchen dicyclomine (BENTYL) 10 MG capsule   Oral   Take 1 capsule (10 mg total) by mouth 2 (two) times daily with a meal.   60 capsule   3   . flecainide (TAMBOCOR) 100 MG tablet   Oral   Take 100 mg by mouth daily.         . furosemide (LASIX) 40 MG tablet      TAKE 1 TABLET (40 MG TOTAL) BY MOUTH DAILY.   30 tablet   11   . metFORMIN (GLUMETZA) 500 MG (MOD) 24 hr tablet   Oral   Take 500 mg by mouth daily with breakfast.          . potassium chloride 20 MEQ/15ML (10%) solution   Oral   Take 20 mEq by mouth daily.         Marland Kitchen pyridoxine (B-6) 100 MG tablet   Oral   Take 200 mg by mouth daily.          . vitamin B-12 (CYANOCOBALAMIN) 500 MCG tablet   Oral   Take 500 mcg by mouth daily.          Marland Kitchen warfarin (COUMADIN) 5 MG tablet   Oral   Take 2.5-5 mg by mouth daily. Takes 5mg  SUNDAY and THURSDAY. Takes 0.5 tablet (2.5mg ) all other days.         . metoprolol succinate (TOPROL-XL) 25 MG 24 hr tablet   Oral   Take 1 tablet (25 mg total) by mouth 2 (two) times daily.   20 tablet   0     BP 111/61  Pulse 62  Temp(Src) 97.6 F (36.4 C) (Oral)  Resp 13  SpO2 97%  Physical Exam  Nursing note and vitals reviewed. Constitutional: She is oriented to person, place, and time. She appears well-developed and well-nourished.  HENT:  Head: Normocephalic and atraumatic.  Eyes: EOM are normal. Pupils are equal, round, and reactive to light.  Neck: Normal range of motion. Neck supple.  Cardiovascular: Normal heart sounds.   No murmur heard. Irregular tachycardia.  Pulmonary/Chest: Effort normal and breath sounds normal. No respiratory distress. She  has no wheezes. She has no rales.  Abdominal: Soft. Bowel sounds are normal. She exhibits no distension. There is no tenderness. There is no rebound and no guarding.   Musculoskeletal: Normal range of motion.  Slight bilateral LE pitting edema  Neurological: She is alert and oriented to person, place, and time. No cranial nerve deficit.  Skin: Skin is warm and dry.  Psychiatric: She has a normal mood and affect. Her speech is normal.    ED Course  Procedures (including critical care time)  Labs Reviewed  CBC WITH DIFFERENTIAL - Abnormal; Notable for the following:    Platelets 421 (*)    Basophils Relative 2 (*)    All other components within normal limits  PROTIME-INR - Abnormal; Notable for the following:    Prothrombin Time 23.0 (*)    INR 2.14 (*)    All other components within normal limits  CG4 I-STAT (LACTIC ACID) - Abnormal; Notable for the following:    Lactic Acid, Venous 3.45 (*)    All other components within normal limits  POCT I-STAT, CHEM 8 - Abnormal; Notable for the following:    Creatinine, Ser 1.40 (*)    Glucose, Bld 272 (*)    All other components within normal limits  URINALYSIS, ROUTINE W REFLEX MICROSCOPIC  MAGNESIUM  GLUCOSE, CAPILLARY  POCT I-STAT TROPONIN I   Dg Chest Port 1 View  03/20/2013   *RADIOLOGY REPORT*  Clinical Data: Shortness of breath.  PORTABLE CHEST - 1 VIEW  Comparison: 12/13/2012  Findings: There is stable appearance of a dual chamber pacemaker. Heart is mildly enlarged.  No edema, focal consolidation or pleural fluid is identified.  There is mild atelectasis at the right lung base.  Large sized hiatal hernia present behind heart.  IMPRESSION: Mild bibasilar atelectasis.  Large hiatal hernia.   Original Report Authenticated By: Aletta Edouard, M.D.     1. Atrial fibrillation with RVR      Date: 03/20/2013 0914  Rate: 177  Rhythm: atrial tachycardia  QRS Axis: normal  Intervals: P waves not seen  ST/T Wave abnormalities: nonspecific ST/T changes  Conduction Disutrbances:none  Narrative Interpretation: LVH  Old EKG Reviewed: changes noted   Date: 03/20/2013  Rate: 95  Rhythm: atrial  flutter  QRS Axis: normal  Intervals: atrial flutter  ST/T Wave abnormalities: normal  Conduction Disutrbances:LVH  Narrative Interpretation: rate improved  Old EKG Reviewed: changes noted    MDM  Patient presents with a narrow complex tachycardia. Likely atrial tachycardia versus atrial flutter. Patient was given 5 mg of IV Cardizem and initially slowed to atrial flutter and then converted to a paced rhythm. After discussion with the patient's cardiologist Dr. Ron Parker, the patient will have her metoprolol dose increased and she will be discharged home. She'll followup in the office. She is already on Coumadin        Jasper Riling. Alvino Chapel, MD 03/20/13 1601

## 2013-03-20 NOTE — ED Notes (Signed)
Patient became SOB this morning. Has a hx of Afib. Feeling sick and nauseous. Patient taking lasix for fluid in legs. Coumadin for afib as well as  Metoprolol. Patient has a pacemaker.

## 2013-03-22 ENCOUNTER — Encounter: Payer: Self-pay | Admitting: Oncology

## 2013-03-22 ENCOUNTER — Other Ambulatory Visit (HOSPITAL_BASED_OUTPATIENT_CLINIC_OR_DEPARTMENT_OTHER): Payer: Medicare Other | Admitting: Lab

## 2013-03-22 ENCOUNTER — Telehealth: Payer: Self-pay | Admitting: Oncology

## 2013-03-22 ENCOUNTER — Ambulatory Visit (HOSPITAL_BASED_OUTPATIENT_CLINIC_OR_DEPARTMENT_OTHER): Payer: Medicare Other | Admitting: Oncology

## 2013-03-22 ENCOUNTER — Telehealth: Payer: Self-pay | Admitting: Cardiology

## 2013-03-22 VITALS — BP 167/89 | HR 75 | Temp 96.9°F | Resp 18 | Ht 72.0 in | Wt 235.1 lb

## 2013-03-22 DIAGNOSIS — D649 Anemia, unspecified: Secondary | ICD-10-CM

## 2013-03-22 DIAGNOSIS — C50919 Malignant neoplasm of unspecified site of unspecified female breast: Secondary | ICD-10-CM

## 2013-03-22 DIAGNOSIS — M8580 Other specified disorders of bone density and structure, unspecified site: Secondary | ICD-10-CM

## 2013-03-22 DIAGNOSIS — M899 Disorder of bone, unspecified: Secondary | ICD-10-CM

## 2013-03-22 DIAGNOSIS — I4891 Unspecified atrial fibrillation: Secondary | ICD-10-CM

## 2013-03-22 LAB — CBC WITH DIFFERENTIAL/PLATELET
BASO%: 1.5 % (ref 0.0–2.0)
Basophils Absolute: 0.1 10*3/uL (ref 0.0–0.1)
EOS%: 1.8 % (ref 0.0–7.0)
HCT: 38.1 % (ref 34.8–46.6)
HGB: 12.9 g/dL (ref 11.6–15.9)
LYMPH%: 20.2 % (ref 14.0–49.7)
MCH: 28.6 pg (ref 25.1–34.0)
MCHC: 33.8 g/dL (ref 31.5–36.0)
MCV: 84.7 fL (ref 79.5–101.0)
MONO%: 10.4 % (ref 0.0–14.0)
NEUT%: 66.1 % (ref 38.4–76.8)
Platelets: 348 10*3/uL (ref 145–400)

## 2013-03-22 LAB — COMPREHENSIVE METABOLIC PANEL (CC13)
AST: 16 U/L (ref 5–34)
BUN: 23.2 mg/dL (ref 7.0–26.0)
Calcium: 9.8 mg/dL (ref 8.4–10.4)
Chloride: 101 mEq/L (ref 98–107)
Creatinine: 1.3 mg/dL — ABNORMAL HIGH (ref 0.6–1.1)
Total Bilirubin: 0.6 mg/dL (ref 0.20–1.20)

## 2013-03-22 NOTE — Telephone Encounter (Signed)
New problem   Per pts daughter she went to the ER([facility]) on Saturday but is still having SOB

## 2013-03-22 NOTE — Telephone Encounter (Addendum)
Pt saw Richardson Dopp on 6/18 and was advised to increase Lasix to 40 MG BID X 5 days then resume reg. dose thereafter for SOB (today is the last day of increased Lasix dose). Pts daughter, [name], states that the pt continues to have SOB. Pt was seen at [facility] ED on 6/21 for SOB and with a narrow complex tachycardia. Likely atrial tachycardia versus atrial flutter. The patient was given 5 mg of IV Cardizem and initially slowed to atrial flutter and then converted to a paced rhythm. [name] wants Dr Ron Parker to be aware and to recommend on what to do about pts sob. Please advise.

## 2013-03-22 NOTE — Progress Notes (Signed)
[place]  [phone] [phone]   OFFICE PROGRESS NOTE   Cc:  Foye Spurling, MD  DIAGNOSIS AND PAST THERAPY: : History of a 3.5 cm, grade 2, invasive ductal carcinoma with intermediate grade ductal in situ with calcification with positive lymphovascular and perineural invasion; status post left breast modified simple mastectomy with sentinel lymph node biopsy with one 3 lymph nodes positive for invasive ductal carcinoma. The margin was negative with the invasive component and 1.5 cm and the in situ distance closest margins at 1 cm; ER 100%; PR 62%; Ki-67 15%; HER-2/neu by CISH was negative with ratio of 1.4.  CURRENT THERAPY: She started on adjuvant anastrozole on 03/04/2010   INTERVAL HISTORY: [name] [name] 79 y.o. female returns for regular follow up by herself.  The patient was recently in the emergency room for atrial fibrillation. She was given IV Cardizem and discharged on metoprolol twice a day. She is still having some mild dyspnea. She has a call into cardiologist's office to discuss this with them. With respect to her history of breast cancer; she is doing well. She is taking anastrozole without major problem. She denies breast abnormality, myalgia, arthralgia, severe bone pain, weight loss, jaundice, bleeding symptoms. She has good performance status. She still lives by herself and is independent of all activities of daily living. Mammogram in May 2014 showed a small 5 mm nodule in the right breast; she has a short-term followup due at the end of August 2014. The rest of the 14 point review system was negative.   Past Medical History  Diagnosis Date  . Hypertension   . Diabetes mellitus   . Tachy-brady syndrome     s/p pacer 07/2009  . Atrial fibrillation     flecainide; coumadin  . Atrial flutter     November, 2010, flecainide, Coumadin  . Chronic diastolic heart failure     echo 10/10: mild LVH, EF 55-60%, mild AI, mild MR, mild  to mod LAE, mild RVE, severe RAE, mod TR, PASP 53;  Echo 3/14:  Mild LVH, EF 55%, Tr AI, MAC, mild MR, mod LAE, PASP 31, trivial eff.  . Shortness of breath     myoview 11/10: EF 65%, no ischemia  . Pulmonary HTN     53 mmHg echo, 2010, moderate TR, mild right ventricular enlargement  . DVT (deep venous thrombosis)     Pulmonary embolus  . Pulmonary embolus 2006    2006, with DVT  . Carotid artery disease     [id number]: RICA [id number], LICA 123456  . Thyroid nodule     non-neoplastic goiter  . Breast cancer   . Ejection fraction     EF 60%, echo, October, 2010  . Warfarin anticoagulation     Atrial fibrillation  . Pacemaker     November, 2010, Dr Rayann Heman - MDT ADDRL1 Adapta DC PPM ser #: [id number]    . S/P laminectomy     June 22, 2011  . Syncope     2010, treated with pacemaker  . Drug therapy     FLECAINIDE  . Mitral regurgitation     mild, echo, October, 2010  . Mediastinal lymphadenopathy   . Venous insufficiency     Chronic  . Hypokalemia     October, 2012, potassium dose increased  . Hiatal hernia 2006    EGD  . Acute gastric ulcer without mention of hemorrhage, perforation, or obstruction 2006    EGD  . Esophagitis, unspecified 2006  EGD  . Allergy     seasonal  . Diverticulosis   . Hx of adenomatous colonic polyps   . Fecal incontinence   . Breast cancer   . Osteopenia     Past Surgical History  Procedure Laterality Date  . Breast surgery      Left  . Abdominal hysterectomy    . Back surgery      1973, 2012   . Ankle surgery      Current Outpatient Prescriptions  Medication Sig Dispense Refill  . anastrozole (ARIMIDEX) 1 MG tablet Take 1 tablet (1 mg total) by mouth daily.  90 tablet  3  . bethanechol (URECHOLINE) 25 MG tablet Take 25 mg by mouth daily.      . cyclobenzaprine (FLEXERIL) 10 MG tablet Take 10 mg by mouth 3 (three) times daily as needed for muscle spasms.       Marland Kitchen dicyclomine (BENTYL) 10 MG capsule Take 1 capsule (10 mg total) by  mouth 2 (two) times daily with a meal.  60 capsule  3  . flecainide (TAMBOCOR) 100 MG tablet Take 100 mg by mouth daily.      . furosemide (LASIX) 40 MG tablet TAKE 1 TABLET (40 MG TOTAL) BY MOUTH DAILY.  30 tablet  11  . metFORMIN (GLUMETZA) 500 MG (MOD) 24 hr tablet Take 500 mg by mouth daily with breakfast.       . metoprolol succinate (TOPROL-XL) 25 MG 24 hr tablet Take 1 tablet (25 mg total) by mouth 2 (two) times daily.  20 tablet  0  . potassium chloride 20 MEQ/15ML (10%) solution Take 20 mEq by mouth daily.      Marland Kitchen pyridoxine (B-6) 100 MG tablet Take 200 mg by mouth daily.       . vitamin B-12 (CYANOCOBALAMIN) 500 MCG tablet Take 500 mcg by mouth daily.       Marland Kitchen warfarin (COUMADIN) 5 MG tablet Take 2.5-5 mg by mouth daily. Takes 5mg  SUNDAY and THURSDAY. Takes 0.5 tablet (2.5mg ) all other days.       No current facility-administered medications for this visit.    ALLERGIES:  is allergic to iohexol.  REVIEW OF SYSTEMS:  The rest of the 14-point review of system was negative.   Filed Vitals:   03/22/13 1041  BP: 167/89  Pulse: 75  Temp: 96.9 F (36.1 C)  Resp: 18   Wt Readings from Last 3 Encounters:  03/22/13 235 lb 1.6 oz (106.641 kg)  03/17/13 239 lb 3.2 oz (108.5 kg)  01/01/13 232 lb (105.235 kg)   ECOG Performance status: 0  PHYSICAL EXAMINATION:   General:  well-nourished woman, in no acute distress.  Eyes:  no scleral icterus.  ENT:  There were no oropharyngeal lesions.  Neck was without thyromegaly.  Lymphatics:  Negative cervical, supraclavicular or axillary adenopathy.  Respiratory: lungs were clear bilaterally without wheezing or crackles.  Cardiovascular:  Regular rate and rhythm, S1/S2, without murmur, rub or gallop.  There was no pedal edema.  GI:  abdomen was soft, flat, nontender, nondistended, without organomegaly.  Muscoloskeletal:  no spinal tenderness of palpation of vertebral spine.  Skin exam was without echymosis, petichae.  Neuro exam was nonfocal.   Patient was able to get on and off exam table without assistance.  Gait was normal.  Patient was alerted and oriented.  Attention was good.   Language was appropriate.  Mood was normal without depression.  Speech was not pressured.  Thought content was not tangential.  Her right breast showed no abnormal breast mass, erythema, skin thickening, nipple discharge, nipple invasion. Her left mastectomy scar was well-healed without any abnormal nodule.    LABORATORY/RADIOLOGY DATA:  Lab Results  Component Value Date   WBC 4.4 03/22/2013   HGB 12.9 03/22/2013   HCT 38.1 03/22/2013   PLT 348 03/22/2013   GLUCOSE 197* 03/22/2013   ALKPHOS 102 03/22/2013   ALT 12 03/22/2013   AST 16 03/22/2013   NA 139 03/22/2013   K 3.7 03/22/2013   CL 101 03/22/2013   CREATININE 1.3* 03/22/2013   BUN 23.2 03/22/2013   CO2 26 03/22/2013   INR 2.14* 03/20/2013     ASSESSMENT AND PLAN:   1. History of breast cancer: There is no evidence of recurrence disease or metastatic disease on clinical history, physical exam, lab test. She has a small 5 mm nodule in her right breast her mammogram in May 2014. This likely is a lymph node, but she has a short-term mammogram due in August 2014. I advised her to continue with anastrozole until at least May 2016. She claims that she is compliant with anastrozole and calcium and vitamin D. Next routine screening mammogram for the right breast (not diagnostic) is due in may of 2015 which needs to be scheduled when she is here next time. 2. Anemia: Unclear etiology: Her hemoglobin has now normalized. She does have history of diverticulosis which may have  slow oozing. Therefore I advised observation. In the future if she has recurrent anemia and may consider  oral iron. 3. Osteopenia: She is on calcium vitamin D. 4. History of deep vein thrombosis: She is on Coumadin per PCP. 5. Diabetes mellitus type 2 with complication: She is on metformin per PCP. 6. Atrial fibrillation: She is on metoprolol  and Coumadin. Rate is controlled today. She will contact her cardiologist office for further followup. 7. Followup: In about 9 months.   The length of time of the face-to-face encounter was 15 minutes. More than 50% of time was spent counseling and coordination of care.

## 2013-03-25 NOTE — Telephone Encounter (Signed)
Please call to see how she is. She has an appt. With me 7/30. IF it seems she shoul be seen sooner, please arrange with Nicki Reaper / Cecille Rubin

## 2013-03-26 ENCOUNTER — Other Ambulatory Visit: Payer: Medicare Other

## 2013-03-29 NOTE — Telephone Encounter (Signed)
Pts daughter, [name], states that the pt is much better and that her SOB is improved. She states that the pt can wait until her appt on 7/30 to see Dr Ron Parker. [name] is advised to call us back if the pt develops worsening SOB, she verbalized understanding.

## 2013-04-14 ENCOUNTER — Ambulatory Visit (INDEPENDENT_AMBULATORY_CARE_PROVIDER_SITE_OTHER): Payer: Medicare Other | Admitting: *Deleted

## 2013-04-14 DIAGNOSIS — Z7901 Long term (current) use of anticoagulants: Secondary | ICD-10-CM

## 2013-04-14 DIAGNOSIS — I2699 Other pulmonary embolism without acute cor pulmonale: Secondary | ICD-10-CM

## 2013-04-14 DIAGNOSIS — I4891 Unspecified atrial fibrillation: Secondary | ICD-10-CM

## 2013-04-27 ENCOUNTER — Encounter: Payer: Self-pay | Admitting: Cardiology

## 2013-04-28 ENCOUNTER — Encounter: Payer: Self-pay | Admitting: Cardiology

## 2013-04-28 ENCOUNTER — Ambulatory Visit (INDEPENDENT_AMBULATORY_CARE_PROVIDER_SITE_OTHER): Payer: Medicare Other | Admitting: Cardiology

## 2013-04-28 VITALS — BP 142/72 | HR 64 | Ht 72.0 in | Wt 241.0 lb

## 2013-04-28 DIAGNOSIS — I4891 Unspecified atrial fibrillation: Secondary | ICD-10-CM

## 2013-04-28 DIAGNOSIS — Z7901 Long term (current) use of anticoagulants: Secondary | ICD-10-CM

## 2013-04-28 DIAGNOSIS — I5032 Chronic diastolic (congestive) heart failure: Secondary | ICD-10-CM

## 2013-04-28 DIAGNOSIS — Z79899 Other long term (current) drug therapy: Secondary | ICD-10-CM

## 2013-04-28 DIAGNOSIS — R0602 Shortness of breath: Secondary | ICD-10-CM

## 2013-04-28 DIAGNOSIS — I779 Disorder of arteries and arterioles, unspecified: Secondary | ICD-10-CM

## 2013-04-28 DIAGNOSIS — E876 Hypokalemia: Secondary | ICD-10-CM

## 2013-04-28 MED ORDER — FUROSEMIDE 40 MG PO TABS: 40.0000 mg | ORAL_TABLET | Freq: Two times a day (BID) | ORAL | Status: DC

## 2013-04-28 MED ORDER — POTASSIUM CHLORIDE 20 MEQ/15ML (10%) PO LIQD: 20.0000 meq | Freq: Two times a day (BID) | ORAL | Status: DC

## 2013-04-28 NOTE — Assessment & Plan Note (Signed)
Patient has chronic diastolic CHF. She has some increase in her volume status at this time. Her furosemide currently is 40 mg daily. I will increase this to 40 mg twice a day. She'll have followup chemistry. She will take extra potassium along with this. She'll begin to weigh daily. We will be in touch with her about her weight and how she's feeling. I will then see her back for followup.

## 2013-04-28 NOTE — Patient Instructions (Addendum)
Your physician has recommended you make the following change in your medication: increase Furosemide to 40 mg twice daily and Potassium to 20 ml twice daily.  Your physician recommends that you return for lab work in: May 05, 2013 (next Wednesday) Do not take your Flecainide dose before labs are drawn, you can take dose after blood work is completed.  Your physician has requested that you have a carotid duplex. This test is an ultrasound of the carotid arteries in your neck. It looks at blood flow through these arteries that supply the brain with blood. Allow one hour for this exam. There are no restrictions or special instructions.  Your physician recommends that you schedule a follow-up appointment in: 8 weeks (Please call office at [phone] one day next week to schedule your follow up appointment).  Please purchase a set scales and weight yourself each morning after going to bathroom. Please keep record of weights.

## 2013-04-28 NOTE — Assessment & Plan Note (Signed)
We're watching her potassium carefully with labs.

## 2013-04-28 NOTE — Assessment & Plan Note (Signed)
Patient has carotid artery disease. The last Doppler that I can find was December, 2011. She did have moderate disease at that time. We will look further to be sure that there is not another Doppler since then. If not she needs a carotid Doppler.

## 2013-04-28 NOTE — Assessment & Plan Note (Signed)
Overall her shortness of breath is stable.

## 2013-04-28 NOTE — Progress Notes (Signed)
HPI  Patient is seen back to followup atrial tachycardia and diastolic CHF and pacemaker function. As part of today's evaluation I have reviewed the prior records of her office visits since I saw her last. I have reviewed her emergency room visit. I looked for her old labs including looking for no flecainide level.  The patient has mild diastolic heart failure. She has atrial tachycardia that occurs from time to time. She is on flecainide for all of her atrial arrhythmias. She needs a followup trough flecainide level. Renal function has been relatively stable. She had some volume overload and her diuretics were adjusted for a few days in early June. She responded to this but she is still now having some volume overload on her prior dose of diuretics. In addition she was seen in the emergency room on March 20, 2013. She had tachycardia. She was given some diltiazem. She converted back to her paced rhythm. She has not had any significant tachycardia since that time. Her resting rate is slow with her atrial pacing.  She has some shortness of breath only when she visits her elderly brother who does not use air-conditioning. Otherwise she is active without shortness of breath.  Allergies  Allergen Reactions  . Iohexol Hives    Current Outpatient Prescriptions  Medication Sig Dispense Refill  . anastrozole (ARIMIDEX) 1 MG tablet Take 1 tablet (1 mg total) by mouth daily.  90 tablet  3  . bethanechol (URECHOLINE) 25 MG tablet Take 25 mg by mouth daily.      . cyclobenzaprine (FLEXERIL) 10 MG tablet Take 10 mg by mouth 3 (three) times daily as needed for muscle spasms.       Marland Kitchen dicyclomine (BENTYL) 10 MG capsule Take 1 capsule (10 mg total) by mouth 2 (two) times daily with a meal.  60 capsule  3  . flecainide (TAMBOCOR) 100 MG tablet Take 100 mg by mouth daily.      . furosemide (LASIX) 40 MG tablet TAKE 1 TABLET (40 MG TOTAL) BY MOUTH DAILY.  30 tablet  11  . metFORMIN (GLUMETZA) 500 MG (MOD) 24 hr  tablet Take 500 mg by mouth daily with breakfast.       . metoprolol succinate (TOPROL-XL) 25 MG 24 hr tablet Take 25 mg by mouth daily.      . potassium chloride 20 MEQ/15ML (10%) solution Take 20 mEq by mouth daily.      Marland Kitchen pyridoxine (B-6) 100 MG tablet Take 200 mg by mouth daily.       . vitamin B-12 (CYANOCOBALAMIN) 500 MCG tablet Take 500 mcg by mouth daily.       Marland Kitchen warfarin (COUMADIN) 5 MG tablet Take 2.5-5 mg by mouth daily. Takes 5mg  SUNDAY and THURSDAY. Takes 0.5 tablet (2.5mg ) all other days.       No current facility-administered medications for this visit.    History   Social History  . Marital Status: Widowed    Spouse Name: N/A    Number of Children: 3  . Years of Education: N/A   Occupational History  . Retired     Social History Main Topics  . Smoking status: Former Smoker -- 4.00 packs/day    Quit date: 10/26/1998  . Smokeless tobacco: Never Used  . Alcohol Use: No  . Drug Use: No  . Sexually Active: Not on file   Other Topics Concern  . Not on file   Social History Narrative   Widowed.  Lives in [place]  by herself.  Avoids caffeine.  Very active, taking care of her brother who also lives by himself and is in his late 52's.    Family History  Problem Relation Age of Onset  . Heart disease Mother   . COPD Father   . Colon cancer Neg Hx     Past Medical History  Diagnosis Date  . Hypertension   . Diabetes mellitus   . Tachy-brady syndrome     s/p pacer 07/2009  . Atrial fibrillation     flecainide; coumadin  . Atrial flutter     November, 2010, flecainide, Coumadin  . Chronic diastolic heart failure     echo 10/10: mild LVH, EF 55-60%, mild AI, mild MR, mild to mod LAE, mild RVE, severe RAE, mod TR, PASP 53;  Echo 3/14:  Mild LVH, EF 55%, Tr AI, MAC, mild MR, mod LAE, PASP 31, trivial eff.  . Shortness of breath     myoview 11/10: EF 65%, no ischemia  . Pulmonary HTN     53 mmHg echo, 2010, moderate TR, mild right ventricular enlargement  . DVT  (deep venous thrombosis)     Pulmonary embolus  . Pulmonary embolus 2006    2006, with DVT  . Carotid artery disease     [id number]: RICA [id number], LICA [id number]  . Thyroid nodule     non-neoplastic goiter  . Breast cancer   . Ejection fraction     EF 60%, echo, October, 2010  . Warfarin anticoagulation     Atrial fibrillation  . Pacemaker     November, 2010, Dr Rayann Heman - MDT ADDRL1 Adapta DC PPM ser #: [id number]    . S/P laminectomy     June 22, 2011  . Syncope     2010, treated with pacemaker  . Drug therapy     FLECAINIDE  . Mitral regurgitation     mild, echo, October, 2010  . Mediastinal lymphadenopathy   . Venous insufficiency     Chronic  . Hypokalemia     October, 2012, potassium dose increased  . Hiatal hernia 2006    EGD  . Acute gastric ulcer without mention of hemorrhage, perforation, or obstruction 2006    EGD  . Esophagitis, unspecified 2006    EGD  . Allergy     seasonal  . Diverticulosis   . Hx of adenomatous colonic polyps   . Fecal incontinence   . Breast cancer   . Osteopenia     Past Surgical History  Procedure Laterality Date  . Breast surgery      Left  . Abdominal hysterectomy    . Back surgery      1973, 2012   . Ankle surgery      Patient Active Problem List   Diagnosis Date Noted  . Osteopenia   . Hypokalemia   . Tachy-brady syndrome   . Atrial fibrillation   . Atrial flutter   . Chronic diastolic heart failure   . Shortness of breath   . Pulmonary HTN   . DVT (deep venous thrombosis)   . Pulmonary embolus   . Carotid artery disease   . Breast cancer   . Ejection fraction   . Warfarin anticoagulation   . Pacemaker-Medtronic   . S/P laminectomy   . Syncope   . Drug therapy   . Mitral regurgitation   . Mediastinal lymphadenopathy   . Venous insufficiency   . Ulcer of toe 04/26/2011  . THYROID NODULE, HX OF 07/19/2009  .  DM 06/13/2008  . ALLERGIC RHINITIS 06/13/2008  . MEDIASTINAL LYMPHADENOPATHY 06/13/2008    ROS    Patient denies fever, chills, headache, sweats, rash, change in vision, change in hearing, chest pain, cough, nausea vomiting, urinary symptoms. All other systems are reviewed and are negative.  PHYSICAL EXAM  Patient's weight is up 2 pounds since her last visit. She is oriented to person time and place. Affect is normal. There is no jugulovenous distention. Lungs are clear. Respiratory effort is nonlabored. Cardiac exam reveals S1 and S2. There no clicks or significant murmurs. The abdomen is soft. There is trace or 1+ peripheral edema. There no musculoskeletal deformities. There are no skin rashes.  Filed Vitals:   04/28/13 0845  BP: 142/72  Pulse: 64  Height: 6' (1.829 m)  Weight: 241 lb (109.317 kg)   EKG is done today and reviewed by me. There is atrial pacing.  ASSESSMENT & PLAN

## 2013-04-28 NOTE — Assessment & Plan Note (Signed)
She continues on her Coumadin for her atrial arrhythmias.  As part of today's evaluation I spent greater than 25 minutes with her total care. More than half of this time has been spent with direct care speaking with her and talking to her about her multiple issues in the medication changes.

## 2013-04-28 NOTE — Assessment & Plan Note (Signed)
The patient has had several atrial arrhythmias over time. These have included atrial fibrillation and atrial flutter and atrial tachycardia. As of today I've chosen not to change her medicines. We will obtain a trough flecainide level. I will keep her beta blocker the same. I've chosen not to had a calcium channel blocker as of today. If her flecainide level warrants any type of adjustment I will communicate this to her. Otherwise I'll plan to follow her for her rhythm abnormality.

## 2013-05-05 ENCOUNTER — Other Ambulatory Visit: Payer: Self-pay

## 2013-05-07 ENCOUNTER — Other Ambulatory Visit: Payer: Self-pay | Admitting: Internal Medicine

## 2013-05-12 ENCOUNTER — Other Ambulatory Visit: Payer: Medicare Other

## 2013-05-12 ENCOUNTER — Encounter (INDEPENDENT_AMBULATORY_CARE_PROVIDER_SITE_OTHER): Payer: Medicare Other

## 2013-05-12 ENCOUNTER — Ambulatory Visit (INDEPENDENT_AMBULATORY_CARE_PROVIDER_SITE_OTHER): Payer: Medicare Other | Admitting: *Deleted

## 2013-05-12 DIAGNOSIS — I4891 Unspecified atrial fibrillation: Secondary | ICD-10-CM

## 2013-05-12 DIAGNOSIS — I779 Disorder of arteries and arterioles, unspecified: Secondary | ICD-10-CM

## 2013-05-12 DIAGNOSIS — I6529 Occlusion and stenosis of unspecified carotid artery: Secondary | ICD-10-CM

## 2013-05-12 DIAGNOSIS — Z7901 Long term (current) use of anticoagulants: Secondary | ICD-10-CM

## 2013-05-12 DIAGNOSIS — I2699 Other pulmonary embolism without acute cor pulmonale: Secondary | ICD-10-CM

## 2013-05-12 DIAGNOSIS — Z79899 Other long term (current) drug therapy: Secondary | ICD-10-CM

## 2013-05-12 LAB — BASIC METABOLIC PANEL
CO2: 29 mEq/L (ref 19–32)
Calcium: 9.3 mg/dL (ref 8.4–10.5)
GFR: 42.97 mL/min — ABNORMAL LOW (ref >60.00)
Potassium: 3.8 mEq/L (ref 3.5–5.1)
Sodium: 139 mEq/L (ref 135–145)

## 2013-05-14 ENCOUNTER — Encounter: Payer: Self-pay | Admitting: Cardiology

## 2013-05-18 LAB — FLECAINIDE LEVEL: Flecainide: 0.32 ug/mL (ref 0.20–1.00)

## 2013-05-19 ENCOUNTER — Other Ambulatory Visit: Payer: Self-pay | Admitting: *Deleted

## 2013-05-19 ENCOUNTER — Other Ambulatory Visit: Payer: Self-pay | Admitting: Cardiology

## 2013-05-20 ENCOUNTER — Other Ambulatory Visit: Payer: Self-pay

## 2013-05-20 ENCOUNTER — Encounter: Payer: Self-pay | Admitting: Cardiology

## 2013-05-20 DIAGNOSIS — Z79899 Other long term (current) drug therapy: Secondary | ICD-10-CM

## 2013-05-20 MED ORDER — FLECAINIDE ACETATE 150 MG PO TABS: 150.0000 mg | ORAL_TABLET | Freq: Two times a day (BID) | ORAL | Status: DC

## 2013-05-20 NOTE — Progress Notes (Signed)
   Flecainide level 0.32 on 100mg  BID. She has had recurrent arrythmias on this. Plan increase dose to 150BID.

## 2013-06-08 ENCOUNTER — Encounter: Payer: Self-pay | Admitting: Cardiology

## 2013-06-09 ENCOUNTER — Ambulatory Visit (INDEPENDENT_AMBULATORY_CARE_PROVIDER_SITE_OTHER): Payer: Medicare Other | Admitting: *Deleted

## 2013-06-09 DIAGNOSIS — I2699 Other pulmonary embolism without acute cor pulmonale: Secondary | ICD-10-CM

## 2013-06-09 DIAGNOSIS — I4891 Unspecified atrial fibrillation: Secondary | ICD-10-CM

## 2013-06-09 DIAGNOSIS — Z7901 Long term (current) use of anticoagulants: Secondary | ICD-10-CM

## 2013-06-21 ENCOUNTER — Ambulatory Visit (INDEPENDENT_AMBULATORY_CARE_PROVIDER_SITE_OTHER): Payer: Medicare Other | Admitting: Cardiology

## 2013-06-21 ENCOUNTER — Encounter: Payer: Self-pay | Admitting: Cardiology

## 2013-06-21 ENCOUNTER — Other Ambulatory Visit: Payer: Medicare Other

## 2013-06-21 VITALS — BP 144/82 | HR 76 | Ht 72.0 in | Wt 239.2 lb

## 2013-06-21 DIAGNOSIS — R0602 Shortness of breath: Secondary | ICD-10-CM

## 2013-06-21 DIAGNOSIS — Z79899 Other long term (current) drug therapy: Secondary | ICD-10-CM

## 2013-06-21 DIAGNOSIS — I4891 Unspecified atrial fibrillation: Secondary | ICD-10-CM

## 2013-06-21 DIAGNOSIS — I5032 Chronic diastolic (congestive) heart failure: Secondary | ICD-10-CM

## 2013-06-21 DIAGNOSIS — I10 Essential (primary) hypertension: Secondary | ICD-10-CM

## 2013-06-21 MED ORDER — FLECAINIDE ACETATE 100 MG PO TABS: 100.0000 mg | ORAL_TABLET | Freq: Two times a day (BID) | ORAL | Status: DC

## 2013-06-21 NOTE — Assessment & Plan Note (Signed)
Patient remains on Coumadin for multiple reasons. Flecainide dose will be corrected to 100 mg twice a day.

## 2013-06-21 NOTE — Assessment & Plan Note (Signed)
I've learned only today that there is been confusion concerning her flecainide dosing. I thought she was on 100 twice a day. In fact she was taking 100 only once daily. When I made the decision to increase her she was increased to 150 twice a day. Therefore the dose increase is to much. I don't know if this is playing a role with any of her symptoms including some tingling in her hands. The dose will be reduced to 100 twice a day. I'll see her for early followup.

## 2013-06-21 NOTE — Assessment & Plan Note (Signed)
Recently her blood pressure was elevated. She was given samples of Benicar. Systolic pressure today is 140. Because she has had so many changes in her medicines recently I've chosen not to continue the Benicar at this point. I will be readjusting her other meds and then decide about further blood pressure meds.

## 2013-06-21 NOTE — Assessment & Plan Note (Signed)
She had some sensation of shortness of breath when talking to her daughter on the phone. This has occurred and 2 occasions. She has no other signs or symptoms of significant pulmonary problems at this time. No change in therapy. I cannot be sure if this is related at all to the medication issues.

## 2013-06-21 NOTE — Patient Instructions (Addendum)
**Note De-Identified  Obfuscation** Your physician has recommended you make the following change in your medication: Do not take Flecainide dose tonight them reduce dose to 100 mg twice daily and Stop taking samples of Benicar.  Your physician recommends that you schedule a follow-up appointment in: 2 to 3 weeks. We will contact you with date and time of your next appointment.

## 2013-06-21 NOTE — Progress Notes (Signed)
HPI  Patient is seen today to followup her atrial tachycardia diastolic CHF and pacemaker function. I saw her last in the office April 28, 2013. At that time I arrange for a flecainide level to see if we should increase the dose. It was my understanding that she was on 100 mg twice daily a flecainide. The level was 0.32. Therefore I made the decision to increase her to 150 mg twice a day. I've learned only today from her and her daughter that in fact she was taking only 100 mg a flecainide once daily. This is the dose that is reflected in the level of 0.32. Therefore we need to immediately cut her dose back down from the 150 twice a day that she's taking as of today to 100 mg twice a day.  In addition she was started on Benicar by her primary physician recently.  In addition she's had some episodes of feeling slightly short of breath when sitting and talking. However she's not having any PND or orthopnea or exertional shortness of breath.  She's also noticed some tingling in her fingers.  Patient has returned to her urologist who wants to proceed with a CT scan.  She has not had any significant palpitations recently.  Allergies  Allergen Reactions  . Iohexol Hives    Current Outpatient Prescriptions  Medication Sig Dispense Refill  . anastrozole (ARIMIDEX) 1 MG tablet Take 1 tablet (1 mg total) by mouth daily.  90 tablet  3  . bethanechol (URECHOLINE) 25 MG tablet Take 25 mg by mouth daily.      . cyclobenzaprine (FLEXERIL) 10 MG tablet Take 10 mg by mouth 3 (three) times daily as needed for muscle spasms.       Marland Kitchen dicyclomine (BENTYL) 10 MG capsule TAKE ONE CAPSULE BY MOUTH TWICE A DAY WITH A MEAL  60 capsule  3  . flecainide (TAMBOCOR) 150 MG tablet Take 1 tablet (150 mg total) by mouth 2 (two) times daily.  60 tablet  3  . furosemide (LASIX) 40 MG tablet Take 1 tablet (40 mg total) by mouth 2 (two) times daily.  60 tablet  6  . metFORMIN (GLUMETZA) 500 MG (MOD) 24 hr tablet Take 500 mg  by mouth daily with breakfast.       . metoprolol succinate (TOPROL-XL) 25 MG 24 hr tablet Take 25 mg by mouth daily.      Marland Kitchen olmesartan (BENICAR) 20 MG tablet Take 20 mg by mouth daily.      . potassium chloride 20 MEQ/15ML (10%) solution Take 15 mLs (20 mEq total) by mouth 2 (two) times daily.  500 mL  6  . pyridoxine (B-6) 100 MG tablet Take 200 mg by mouth daily.       . vitamin B-12 (CYANOCOBALAMIN) 500 MCG tablet Take 500 mcg by mouth daily.       Marland Kitchen warfarin (COUMADIN) 5 MG tablet TAKE AS DIRECTED BY COUMADIN CLINIC  30 tablet  3   No current facility-administered medications for this visit.    History   Social History  . Marital Status: Widowed    Spouse Name: N/A    Number of Children: 3  . Years of Education: N/A   Occupational History  . Retired     Social History Main Topics  . Smoking status: Former Smoker -- 4.00 packs/day    Quit date: 10/26/1998  . Smokeless tobacco: Never Used  . Alcohol Use: No  . Drug Use: No  . Sexual  Activity: Not on file   Other Topics Concern  . Not on file   Social History Narrative   Widowed.  Lives in [place] by herself.  Avoids caffeine.  Very active, taking care of her brother who also lives by himself and is in his late 23's.    Family History  Problem Relation Age of Onset  . Heart disease Mother   . COPD Father   . Colon cancer Neg Hx     Past Medical History  Diagnosis Date  . Hypertension   . Diabetes mellitus   . Tachy-brady syndrome     s/p pacer 07/2009  . Atrial fibrillation     flecainide; coumadin  . Atrial flutter     November, 2010, flecainide, Coumadin  . Chronic diastolic heart failure     echo 10/10: mild LVH, EF 55-60%, mild AI, mild MR, mild to mod LAE, mild RVE, severe RAE, mod TR, PASP 53;  Echo 3/14:  Mild LVH, EF 55%, Tr AI, MAC, mild MR, mod LAE, PASP 31, trivial eff.  . Shortness of breath     myoview 11/10: EF 65%, no ischemia  . Pulmonary HTN     53 mmHg echo, 2010, moderate TR, mild right  ventricular enlargement  . DVT (deep venous thrombosis)     Pulmonary embolus  . Pulmonary embolus 2006    2006, with DVT  . Carotid artery disease     [id number]: RICA [id number], LICA [id number]  . Thyroid nodule     non-neoplastic goiter  . Breast cancer   . Ejection fraction     EF 60%, echo, October, 2010  . Warfarin anticoagulation     Atrial fibrillation  . Pacemaker     November, 2010, Dr Rayann Heman - MDT ADDRL1 Adapta DC PPM ser #: [id number]    . S/P laminectomy     June 22, 2011  . Syncope     2010, treated with pacemaker  . Drug therapy     FLECAINIDE  . Mitral regurgitation     mild, echo, October, 2010  . Mediastinal lymphadenopathy   . Venous insufficiency     Chronic  . Hypokalemia     October, 2012, potassium dose increased  . Hiatal hernia 2006    EGD  . Acute gastric ulcer without mention of hemorrhage, perforation, or obstruction 2006    EGD  . Esophagitis, unspecified 2006    EGD  . Allergy     seasonal  . Diverticulosis   . Hx of adenomatous colonic polyps   . Fecal incontinence   . Breast cancer   . Osteopenia     Past Surgical History  Procedure Laterality Date  . Breast surgery      Left  . Abdominal hysterectomy    . Back surgery      1973, 2012   . Ankle surgery      Patient Active Problem List   Diagnosis Date Noted  . Osteopenia   . Hypokalemia   . Tachy-brady syndrome   . Atrial fibrillation   . Atrial flutter   . Chronic diastolic heart failure   . Shortness of breath   . Pulmonary HTN   . DVT (deep venous thrombosis)   . Pulmonary embolus   . Carotid artery disease   . Breast cancer   . Ejection fraction   . Warfarin anticoagulation   . Pacemaker-Medtronic   . S/P laminectomy   . Syncope   . Drug therapy   .  Mitral regurgitation   . Mediastinal lymphadenopathy   . Venous insufficiency   . Ulcer of toe 04/26/2011  . THYROID NODULE, HX OF 07/19/2009  . DM 06/13/2008  . ALLERGIC RHINITIS 06/13/2008  . MEDIASTINAL  LYMPHADENOPATHY 06/13/2008    ROS   Patient denies fever, chills, headache, sweats, rash, change in vision, change in hearing, chest pain, cough, nausea vomiting, all other systems are reviewed and are negative other than the history of present illness.  PHYSICAL EXAM  Filed Vitals:   06/21/13 0939  BP: 144/82  Pulse: 76  Height: 6' (1.829 m)  Weight: 239 lb 3.2 oz (108.5 kg)   EKG is done today and reviewed by me. She is atrially paced with sinus rhythm. ASSESSMENT & PLAN

## 2013-06-21 NOTE — Assessment & Plan Note (Signed)
Her volume status is stable.

## 2013-06-25 ENCOUNTER — Ambulatory Visit: Payer: Medicare Other | Admitting: Cardiology

## 2013-06-29 ENCOUNTER — Ambulatory Visit: Payer: Medicare Other | Admitting: Cardiology

## 2013-06-29 LAB — FLECAINIDE LEVEL: Flecainide: 0.88 ug/mL (ref 0.20–1.00)

## 2013-07-01 ENCOUNTER — Encounter: Payer: Self-pay | Admitting: Cardiology

## 2013-07-01 NOTE — Progress Notes (Signed)
   Recently we tried to straighten out the dosing of the patient's flecainide. She was placed on 100 mg twice daily. Flecainide level is 0.88 on this dose. She will remain on this.

## 2013-07-05 ENCOUNTER — Telehealth: Payer: Self-pay | Admitting: Cardiology

## 2013-07-05 NOTE — Telephone Encounter (Signed)
Spoke with patient about recent lab results 

## 2013-07-05 NOTE — Telephone Encounter (Signed)
Follow Up  Pt returning the call

## 2013-07-08 ENCOUNTER — Ambulatory Visit (INDEPENDENT_AMBULATORY_CARE_PROVIDER_SITE_OTHER): Payer: Medicare Other | Admitting: General Practice

## 2013-07-08 ENCOUNTER — Ambulatory Visit (INDEPENDENT_AMBULATORY_CARE_PROVIDER_SITE_OTHER): Payer: Medicare Other | Admitting: *Deleted

## 2013-07-08 DIAGNOSIS — Z7901 Long term (current) use of anticoagulants: Secondary | ICD-10-CM

## 2013-07-08 DIAGNOSIS — I2699 Other pulmonary embolism without acute cor pulmonale: Secondary | ICD-10-CM

## 2013-07-08 DIAGNOSIS — I4892 Unspecified atrial flutter: Secondary | ICD-10-CM

## 2013-07-08 DIAGNOSIS — I4891 Unspecified atrial fibrillation: Secondary | ICD-10-CM

## 2013-07-08 LAB — PACEMAKER DEVICE OBSERVATION
AL IMPEDENCE PM: 463 Ohm
AL THRESHOLD: 0.75 V
BATTERY VOLTAGE: 2.79 V
RV LEAD AMPLITUDE: 5.6 mv

## 2013-07-08 LAB — POCT INR: INR: 2.1

## 2013-07-08 NOTE — Progress Notes (Signed)
Pacemaker check in clinic. Normal device function. Thresholds, sensing, impedances consistent with previous measurements. Device programmed to maximize longevity. 40 mode switches the longest 1:52 hours, + coumadin.  Total time in A-fib <0.1%.  2 high ventricular rates noted 2-4 seconds during A-fib.  80% of heart rates during A-fib > 100bpm.   Device programmed at appropriate safety margins. Histogram distribution appropriate for patient activity level. Device programmed to optimize intrinsic conduction. Estimated longevity 10 years. Patient enrolled in remote follow-up/TTM's with [employer]. Plan to follow every 3 months remotely and see annually in office. Patient education completed.  [facility] 10/11/12.

## 2013-07-19 ENCOUNTER — Ambulatory Visit (HOSPITAL_COMMUNITY): Payer: Medicare Other | Attending: Cardiovascular Disease

## 2013-07-19 ENCOUNTER — Encounter: Payer: Self-pay | Admitting: Cardiology

## 2013-07-19 ENCOUNTER — Ambulatory Visit (INDEPENDENT_AMBULATORY_CARE_PROVIDER_SITE_OTHER): Payer: Medicare Other | Admitting: Cardiology

## 2013-07-19 VITALS — BP 140/80 | HR 84 | Ht 72.0 in | Wt 238.8 lb

## 2013-07-19 DIAGNOSIS — E119 Type 2 diabetes mellitus without complications: Secondary | ICD-10-CM | POA: Insufficient documentation

## 2013-07-19 DIAGNOSIS — I872 Venous insufficiency (chronic) (peripheral): Secondary | ICD-10-CM

## 2013-07-19 DIAGNOSIS — I10 Essential (primary) hypertension: Secondary | ICD-10-CM

## 2013-07-19 DIAGNOSIS — I5032 Chronic diastolic (congestive) heart failure: Secondary | ICD-10-CM

## 2013-07-19 DIAGNOSIS — I779 Disorder of arteries and arterioles, unspecified: Secondary | ICD-10-CM

## 2013-07-19 DIAGNOSIS — Z79899 Other long term (current) drug therapy: Secondary | ICD-10-CM

## 2013-07-19 DIAGNOSIS — R0602 Shortness of breath: Secondary | ICD-10-CM

## 2013-07-19 DIAGNOSIS — M7989 Other specified soft tissue disorders: Secondary | ICD-10-CM

## 2013-07-19 DIAGNOSIS — Z950 Presence of cardiac pacemaker: Secondary | ICD-10-CM

## 2013-07-19 DIAGNOSIS — Z7901 Long term (current) use of anticoagulants: Secondary | ICD-10-CM

## 2013-07-19 DIAGNOSIS — Z87891 Personal history of nicotine dependence: Secondary | ICD-10-CM | POA: Insufficient documentation

## 2013-07-19 DIAGNOSIS — I495 Sick sinus syndrome: Secondary | ICD-10-CM

## 2013-07-19 DIAGNOSIS — I4891 Unspecified atrial fibrillation: Secondary | ICD-10-CM

## 2013-07-19 NOTE — Patient Instructions (Signed)
**Note De-Identified  Obfuscation** Your physician has requested that you have a lower or upper extremity venous duplex. This test is an ultrasound of the veins in the legs or arms. It looks at venous blood flow that carries blood from the heart to the legs or arms. Allow one hour for a Lower Venous exam. Allow thirty minutes for an Upper Venous exam. There are no restrictions or special instructions.  Your physician recommends that you schedule a follow-up appointment in: 4 weeks  Please limit your salt and fluid (including water) intake.

## 2013-07-19 NOTE — Progress Notes (Signed)
HPI  Patient is seen today to followup diastolic CHF and paroxysmal supraventricular arrhythmias.  when I saw her last. It became clear that her flecainide dosing was not as I had planned. This was corrected. We then did a followup level that was in the therapeutic safety range. She is not having significant palpitations. However she has a new issue with swelling of her right leg today. She's not having pain. Her weight is stable when compared to the last visit.   No Active Allergies  Current Outpatient Prescriptions  Medication Sig Dispense Refill  . anastrozole (ARIMIDEX) 1 MG tablet Take 1 tablet (1 mg total) by mouth daily.  90 tablet  3  . bethanechol (URECHOLINE) 25 MG tablet Take 25 mg by mouth daily.      . cyclobenzaprine (FLEXERIL) 10 MG tablet Take 10 mg by mouth 3 (three) times daily as needed for muscle spasms.       Marland Kitchen dicyclomine (BENTYL) 10 MG capsule TAKE ONE CAPSULE BY MOUTH TWICE A DAY WITH A MEAL  60 capsule  3  . flecainide (TAMBOCOR) 100 MG tablet Take 1 tablet (100 mg total) by mouth 2 (two) times daily.  60 tablet  3  . furosemide (LASIX) 40 MG tablet Take 1 tablet (40 mg total) by mouth 2 (two) times daily.  60 tablet  6  . metFORMIN (GLUMETZA) 500 MG (MOD) 24 hr tablet Take 500 mg by mouth daily with breakfast.       . metoprolol succinate (TOPROL-XL) 25 MG 24 hr tablet Take 25 mg by mouth daily.      . potassium chloride 20 MEQ/15ML (10%) solution Take 15 mLs (20 mEq total) by mouth 2 (two) times daily.  500 mL  6  . pyridoxine (B-6) 100 MG tablet Take 200 mg by mouth daily.       . vitamin B-12 (CYANOCOBALAMIN) 500 MCG tablet Take 500 mcg by mouth daily.       Marland Kitchen warfarin (COUMADIN) 5 MG tablet TAKE AS DIRECTED BY COUMADIN CLINIC  30 tablet  3   No current facility-administered medications for this visit.    History   Social History  . Marital Status: Widowed    Spouse Name: N/A    Number of Children: 3  . Years of Education: N/A   Occupational History    . Retired     Social History Main Topics  . Smoking status: Former Smoker -- 4.00 packs/day    Quit date: 10/26/1998  . Smokeless tobacco: Never Used  . Alcohol Use: No  . Drug Use: No  . Sexual Activity: Not on file   Other Topics Concern  . Not on file   Social History Narrative   Widowed.  Lives in [place] by herself.  Avoids caffeine.  Very active, taking care of her brother who also lives by himself and is in his late 32's.    Family History  Problem Relation Age of Onset  . Heart disease Mother   . COPD Father   . Colon cancer Neg Hx     Past Medical History  Diagnosis Date  . Hypertension   . Diabetes mellitus   . Tachy-brady syndrome     s/p pacer 07/2009  . Atrial fibrillation     flecainide; coumadin  . Atrial flutter     November, 2010, flecainide, Coumadin  . Chronic diastolic heart failure     echo 10/10: mild LVH, EF 55-60%, mild AI, mild MR, mild to  mod LAE, mild RVE, severe RAE, mod TR, PASP 53;  Echo 3/14:  Mild LVH, EF 55%, Tr AI, MAC, mild MR, mod LAE, PASP 31, trivial eff.  . Shortness of breath     myoview 11/10: EF 65%, no ischemia  . Pulmonary HTN     53 mmHg echo, 2010, moderate TR, mild right ventricular enlargement  . DVT (deep venous thrombosis)     Pulmonary embolus  . Pulmonary embolus 2006    2006, with DVT  . Carotid artery disease     [id number]: RICA [id number], LICA [id number]  . Thyroid nodule     non-neoplastic goiter  . Breast cancer   . Ejection fraction     EF 60%, echo, October, 2010  . Warfarin anticoagulation     Atrial fibrillation  . Pacemaker     November, 2010, Dr Rayann Heman - MDT ADDRL1 Adapta DC PPM ser #: [id number]    . S/P laminectomy     June 22, 2011  . Syncope     2010, treated with pacemaker  . Drug therapy     FLECAINIDE  . Mitral regurgitation     mild, echo, October, 2010  . Mediastinal lymphadenopathy   . Venous insufficiency     Chronic  . Hypokalemia     October, 2012, potassium dose increased  . Hiatal  hernia 2006    EGD  . Acute gastric ulcer without mention of hemorrhage, perforation, or obstruction 2006    EGD  . Esophagitis, unspecified 2006    EGD  . Allergy     seasonal  . Diverticulosis   . Hx of adenomatous colonic polyps   . Fecal incontinence   . Breast cancer   . Osteopenia     Past Surgical History  Procedure Laterality Date  . Breast surgery      Left  . Abdominal hysterectomy    . Back surgery      1973, 2012   . Ankle surgery      Patient Active Problem List   Diagnosis Date Noted  . Essential hypertension 06/21/2013  . Hypertension 06/21/2013  . Osteopenia   . Hypokalemia   . Tachy-brady syndrome   . Atrial fibrillation   . Atrial flutter   . Chronic diastolic heart failure   . Shortness of breath   . Pulmonary HTN   . DVT (deep venous thrombosis)   . Pulmonary embolus   . Carotid artery disease   . Breast cancer   . Ejection fraction   . Warfarin anticoagulation   . Pacemaker-Medtronic   . S/P laminectomy   . Syncope   . Drug therapy   . Mitral regurgitation   . Mediastinal lymphadenopathy   . Venous insufficiency   . Ulcer of toe 04/26/2011  . THYROID NODULE, HX OF 07/19/2009  . DM 06/13/2008  . ALLERGIC RHINITIS 06/13/2008  . MEDIASTINAL LYMPHADENOPATHY 06/13/2008    ROS   Patient denies fever, chills, headache, sweats, rash, change in vision, change in hearing, chest pain, cough, nausea vomiting, urinary symptoms. All other systems are reviewed and are negative.  PHYSICAL EXAM  Patient is oriented to person time and place. Affect is normal. There is no jugulovenous distention. Lungs are clear. Respiratory effort is nonlabored. Cardiac exam reveals S1 and S2. The abdomen is soft. The patient does appear to have mild edema in both her lower leg and upper leg. There is no definite evidence of pain or redness. She walks with a cane. There no  skin rashes.   Filed Vitals:   07/19/13 1006  BP: 140/80  Pulse: 84  Height: 6' (1.829 m)    Weight: 238 lb 12.8 oz (108.319 kg)     ASSESSMENT & PLAN

## 2013-07-19 NOTE — Assessment & Plan Note (Signed)
Blood pressure is controlled. No change in therapy. 

## 2013-07-19 NOTE — Assessment & Plan Note (Signed)
Carotid disease has been followed carefully. Her Doppler in August, 2014 showed slight progression. Followup is being arranged.

## 2013-07-19 NOTE — Assessment & Plan Note (Signed)
Coumadin is being continued. She needs this for her atrial fibrillation and history of deep venous thrombosis. Most recently her INR was therapeutic.

## 2013-07-19 NOTE — Assessment & Plan Note (Signed)
The patient has chronic diastolic heart failure. Her total volume appears to be stable. The swelling in her right leg using a lateral. I am not planning to push her diuretic dose higher as of today. First I will wait for the venous Doppler.

## 2013-07-19 NOTE — Assessment & Plan Note (Signed)
The patient will continue on flecainide at the current dose for her supraventricular arrhythmias.

## 2013-07-19 NOTE — Assessment & Plan Note (Signed)
Patient is on flecainide and Coumadin. She is stable.  As part of today's evaluation I spent greater than 25 minutes with her total care. More than half of this time has been spent reviewing all of her medical problems with her. In addition there is the assessment and discussion about the swelling in her right leg.

## 2013-07-19 NOTE — Assessment & Plan Note (Signed)
Right leg swelling is fairly new according to her. It is not clear why she would have unilateral swelling. Venous Doppler will be done to rule out significant obstruction. However she is coumadinized.

## 2013-07-19 NOTE — Assessment & Plan Note (Signed)
At this point she is not having any significant shortness of breath.

## 2013-07-19 NOTE — Assessment & Plan Note (Signed)
The patient has venous insufficiency. It is possible that this is the explanation for her right leg swelling today. But I am not sure at this point.

## 2013-07-19 NOTE — Assessment & Plan Note (Signed)
Her pacemaker was checked recently and was working well. I have reviewed that note completely.

## 2013-07-19 NOTE — Assessment & Plan Note (Signed)
The patient is now taking the correct dose of flecainide. She has not had recent significant palpitations.

## 2013-07-21 ENCOUNTER — Encounter (HOSPITAL_COMMUNITY): Payer: Self-pay | Admitting: Emergency Medicine

## 2013-07-21 ENCOUNTER — Emergency Department (HOSPITAL_COMMUNITY)
Admission: EM | Admit: 2013-07-21 | Discharge: 2013-07-21 | Disposition: A | Discharge status: Home / Self Care | Payer: Medicare Other | Attending: Emergency Medicine | Admitting: Emergency Medicine

## 2013-07-21 DIAGNOSIS — Z86718 Personal history of other venous thrombosis and embolism: Secondary | ICD-10-CM | POA: Insufficient documentation

## 2013-07-21 DIAGNOSIS — Z950 Presence of cardiac pacemaker: Secondary | ICD-10-CM | POA: Insufficient documentation

## 2013-07-21 DIAGNOSIS — L97509 Non-pressure chronic ulcer of other part of unspecified foot with unspecified severity: Secondary | ICD-10-CM | POA: Insufficient documentation

## 2013-07-21 DIAGNOSIS — I2510 Atherosclerotic heart disease of native coronary artery without angina pectoris: Secondary | ICD-10-CM | POA: Insufficient documentation

## 2013-07-21 DIAGNOSIS — E119 Type 2 diabetes mellitus without complications: Secondary | ICD-10-CM | POA: Insufficient documentation

## 2013-07-21 DIAGNOSIS — I5032 Chronic diastolic (congestive) heart failure: Secondary | ICD-10-CM | POA: Insufficient documentation

## 2013-07-21 DIAGNOSIS — Z87891 Personal history of nicotine dependence: Secondary | ICD-10-CM | POA: Insufficient documentation

## 2013-07-21 DIAGNOSIS — Z860100 Personal history of colon polyps, unspecified: Secondary | ICD-10-CM | POA: Insufficient documentation

## 2013-07-21 DIAGNOSIS — Z86711 Personal history of pulmonary embolism: Secondary | ICD-10-CM | POA: Insufficient documentation

## 2013-07-21 DIAGNOSIS — M7989 Other specified soft tissue disorders: Secondary | ICD-10-CM | POA: Insufficient documentation

## 2013-07-21 DIAGNOSIS — Z7901 Long term (current) use of anticoagulants: Secondary | ICD-10-CM | POA: Insufficient documentation

## 2013-07-21 DIAGNOSIS — Z8711 Personal history of peptic ulcer disease: Secondary | ICD-10-CM | POA: Insufficient documentation

## 2013-07-21 DIAGNOSIS — Z79899 Other long term (current) drug therapy: Secondary | ICD-10-CM | POA: Insufficient documentation

## 2013-07-21 DIAGNOSIS — Z853 Personal history of malignant neoplasm of breast: Secondary | ICD-10-CM | POA: Insufficient documentation

## 2013-07-21 DIAGNOSIS — L97511 Non-pressure chronic ulcer of other part of right foot limited to breakdown of skin: Secondary | ICD-10-CM

## 2013-07-21 DIAGNOSIS — I10 Essential (primary) hypertension: Secondary | ICD-10-CM | POA: Insufficient documentation

## 2013-07-21 DIAGNOSIS — I4891 Unspecified atrial fibrillation: Secondary | ICD-10-CM | POA: Insufficient documentation

## 2013-07-21 LAB — CBC WITH DIFFERENTIAL/PLATELET
Basophils Relative: 2 % — ABNORMAL HIGH (ref 0–1)
Eosinophils Absolute: 0.1 10*3/uL (ref 0.0–0.7)
Eosinophils Relative: 2 % (ref 0–5)
HCT: 36.7 % (ref 36.0–46.0)
Hemoglobin: 12.6 g/dL (ref 12.0–15.0)
Lymphs Abs: 0.7 10*3/uL (ref 0.7–4.0)
MCH: 30.1 pg (ref 26.0–34.0)
MCHC: 34.3 g/dL (ref 30.0–36.0)
MCV: 87.8 fL (ref 78.0–100.0)
Monocytes Relative: 10 % (ref 3–12)
RBC: 4.18 MIL/uL (ref 3.87–5.11)
RDW: 14 % (ref 11.5–15.5)

## 2013-07-21 LAB — BASIC METABOLIC PANEL
BUN: 21 mg/dL (ref 6–23)
Calcium: 10.1 mg/dL (ref 8.4–10.5)
Creatinine, Ser: 1.3 mg/dL — ABNORMAL HIGH (ref 0.50–1.10)
GFR calc Af Amer: 44 mL/min — ABNORMAL LOW (ref >90)
GFR calc non Af Amer: 38 mL/min — ABNORMAL LOW (ref >90)
Glucose, Bld: 162 mg/dL — ABNORMAL HIGH (ref 70–99)
Potassium: 3.7 mEq/L (ref 3.5–5.1)

## 2013-07-21 LAB — GLUCOSE, CAPILLARY

## 2013-07-21 LAB — PROTIME-INR: INR: 2.23 — ABNORMAL HIGH (ref 0.00–1.49)

## 2013-07-21 NOTE — ED Provider Notes (Signed)
TIME SEEN: 8:12 AM  CHIEF COMPLAINT: Right lower extremity swelling  HPI: Patient is a 79 y.o. female with a history of supraventricular tachycardia, atrial fibrillation on flecainide and Coumadin, heart failure status post pacemaker placement, prior PE and DVT who presents emergency department with several days of swelling to her right lower extremity. She was seen by her cardiologist on 07/19/13 and was scheduled for an outpatient venous Doppler. She has not received the results for this ultrasound. She states that her INR has been therapeutic but she does not recall the value. She denies any injury to her leg. No leg pain. No fevers or chills. No chest pain or shortness of breath. She has chronic bilateral lower extremity neuropathy from diabetes. No new numbness. No focal weakness. She also reports noticing a small superficial wound to her lateral fifth right toe with no purulent drainage. Does not remember injuring her leg.  ROS: See HPI Constitutional: no fever  Eyes: no drainage  ENT: no runny nose   Cardiovascular:  no chest pain  Resp: no SOB  GI: no vomiting GU: no dysuria Integumentary: no rash  Allergy: no hives  Musculoskeletal: no leg swelling  Neurological: no slurred speech ROS otherwise negative  PAST MEDICAL HISTORY/PAST SURGICAL HISTORY:  Past Medical History  Diagnosis Date  . Hypertension   . Diabetes mellitus   . Tachy-brady syndrome     s/p pacer 07/2009  . Atrial fibrillation     flecainide; coumadin  . Atrial flutter     November, 2010, flecainide, Coumadin  . Chronic diastolic heart failure     echo 10/10: mild LVH, EF 55-60%, mild AI, mild MR, mild to mod LAE, mild RVE, severe RAE, mod TR, PASP 53;  Echo 3/14:  Mild LVH, EF 55%, Tr AI, MAC, mild MR, mod LAE, PASP 31, trivial eff.  . Shortness of breath     myoview 11/10: EF 65%, no ischemia  . Pulmonary HTN     53 mmHg echo, 2010, moderate TR, mild right ventricular enlargement  . DVT (deep venous  thrombosis)     Pulmonary embolus  . Pulmonary embolus 2006    2006, with DVT  . Carotid artery disease     [id number]: RICA [id number], LICA [id number]  . Thyroid nodule     non-neoplastic goiter  . Breast cancer   . Ejection fraction     EF 60%, echo, October, 2010  . Warfarin anticoagulation     Atrial fibrillation  . Pacemaker     November, 2010, Dr Rayann Heman - MDT ADDRL1 Adapta DC PPM ser #: [id number]    . S/P laminectomy     June 22, 2011  . Syncope     2010, treated with pacemaker  . Drug therapy     FLECAINIDE  . Mitral regurgitation     mild, echo, October, 2010  . Mediastinal lymphadenopathy   . Venous insufficiency     Chronic  . Hypokalemia     October, 2012, potassium dose increased  . Hiatal hernia 2006    EGD  . Acute gastric ulcer without mention of hemorrhage, perforation, or obstruction 2006    EGD  . Esophagitis, unspecified 2006    EGD  . Allergy     seasonal  . Diverticulosis   . Hx of adenomatous colonic polyps   . Fecal incontinence   . Breast cancer   . Osteopenia     MEDICATIONS:  Prior to Admission medications   Medication Sig Start Date End  Date Taking? Authorizing Provider  anastrozole (ARIMIDEX) 1 MG tablet Take 1 tablet (1 mg total) by mouth daily. 08/03/12   Maryanna Shape, NP  bethanechol (URECHOLINE) 25 MG tablet Take 25 mg by mouth daily.    Historical Provider, MD  cyclobenzaprine (FLEXERIL) 10 MG tablet Take 10 mg by mouth 3 (three) times daily as needed for muscle spasms.  07/01/11   Historical Provider, MD  dicyclomine (BENTYL) 10 MG capsule TAKE ONE CAPSULE BY MOUTH TWICE A DAY WITH A MEAL 05/07/13   Lafayette Dragon, MD  flecainide (TAMBOCOR) 100 MG tablet Take 1 tablet (100 mg total) by mouth 2 (two) times daily. 06/21/13   Carlena Bjornstad, MD  furosemide (LASIX) 40 MG tablet Take 1 tablet (40 mg total) by mouth 2 (two) times daily. 04/28/13   Carlena Bjornstad, MD  metFORMIN (GLUMETZA) 500 MG (MOD) 24 hr tablet Take 500 mg by mouth daily with  breakfast.     Historical Provider, MD  metoprolol succinate (TOPROL-XL) 25 MG 24 hr tablet Take 25 mg by mouth daily. 03/20/13 03/20/14  Jasper Riling. Pickering, MD  potassium chloride 20 MEQ/15ML (10%) solution Take 15 mLs (20 mEq total) by mouth 2 (two) times daily. 04/28/13   Carlena Bjornstad, MD  pyridoxine (B-6) 100 MG tablet Take 200 mg by mouth daily.     Historical Provider, MD  vitamin B-12 (CYANOCOBALAMIN) 500 MCG tablet Take 500 mcg by mouth daily.     Historical Provider, MD  warfarin (COUMADIN) 5 MG tablet TAKE AS DIRECTED BY COUMADIN CLINIC 05/19/13   Carlena Bjornstad, MD    ALLERGIES:  No Known Allergies  SOCIAL HISTORY:  History  Substance Use Topics  . Smoking status: Former Smoker -- 4.00 packs/day    Quit date: 10/26/1998  . Smokeless tobacco: Never Used  . Alcohol Use: No    FAMILY HISTORY: Family History  Problem Relation Age of Onset  . Heart disease Mother   . COPD Father   . Colon cancer Neg Hx     EXAM: BP 157/64  Pulse 65  Temp(Src) 97.8 F (36.6 C) (Oral)  SpO2 95% CONSTITUTIONAL: Alert and oriented and responds appropriately to questions. Well-appearing; well-nourished HEAD: Normocephalic EYES: Conjunctivae clear, PERRL ENT: normal nose; no rhinorrhea; moist mucous membranes; pharynx without lesions noted NECK: Supple, no meningismus, no LAD  CARD: RRR; S1 and S2 appreciated; no murmurs, no clicks, no rubs, no gallops RESP: Normal chest excursion without splinting or tachypnea; breath sounds clear and equal bilaterally; no wheezes, no rhonchi, no rales,  ABD/GI: Normal bowel sounds; non-distended; soft, non-tender, no rebound, no guarding BACK:  The back appears normal and is non-tender to palpation, there is no CVA tenderness EXT: Normal ROM in all joints; non-tender to palpation; no edema; normal capillary refill; no cyanosis; patient has mild swelling to her right foot compared to left, no obvious swelling of the calf or thigh, no pitting edema, 2+ DP  and PT pulses bilaterally, feet are warm and well perfused; no induration, warmth, erythema; superficial 2 x 2 centimeter wound to the lateral right fifth toe without purulent drainage; no bony deformity SKIN: Normal color for age and race; warm NEURO: Moves all extremities equally PSYCH: The patient's mood and manner are appropriate. Grooming and personal hygiene are appropriate.  MEDICAL DECISION MAKING: Patient with recent right lower extremity swelling. No history of injury. No signs of infection on exam. She recently had a venous Doppler, results pending. Patient does have a  history of venous insufficiency. Will obtain ultrasound report from [facility].  We'll also check basic labs, INR.  ED PROGRESS: Labs reassuring. Patient does have a creatinine of 1.3 which is baseline. INR is 2.23. Results were seen from outpatient vascular lab, confirmed by Dr. Sherren Mocha. Duplex imaging of the right lower extremity reveals easily compressible deep and superficial veins of the right lower extremity with no intraluminal thrombus or venous incompetence.  Again no signs of cellulitis or arterial obstruction on exam. Recommended supportive care including elevation of her lower extremity, compression hose, continuing warfarin. Have given strict return precautions. Patient will followup with her primary care physician, Dr. Carlis Abbott, this week. Patient and daughter at bedside verbalize understanding and are comfortable with this plan.     [place], DO 07/21/13 [phone]

## 2013-07-21 NOTE — ED Notes (Signed)
MD at bedside. 

## 2013-07-26 ENCOUNTER — Encounter: Payer: Self-pay | Admitting: Cardiology

## 2013-07-26 ENCOUNTER — Telehealth: Payer: Self-pay | Admitting: *Deleted

## 2013-07-26 NOTE — Telephone Encounter (Signed)
Call from pt stating had edema of right leg and area on small toe right foot  opened and had bleeding noted. Lower extremity venous  Duplex  done and was seen in ER on 07/21/2013 and has seen her Podiatrist .Placed on Cephalexin 500mg  tid for 10 days started on 07/24/2013. Pt instructed that this antibiotic did not interfere with her coumadin and she wanted her appt time moved to the time of her appt to see Dr Ron Parker Pt states understanding of instructions.

## 2013-07-27 ENCOUNTER — Other Ambulatory Visit: Payer: Self-pay | Admitting: Cardiology

## 2013-07-28 ENCOUNTER — Telehealth: Payer: Self-pay | Admitting: *Deleted

## 2013-07-28 ENCOUNTER — Encounter: Payer: Self-pay | Admitting: Internal Medicine

## 2013-07-28 ENCOUNTER — Other Ambulatory Visit: Payer: Self-pay | Admitting: *Deleted

## 2013-07-28 ENCOUNTER — Other Ambulatory Visit: Payer: Self-pay | Admitting: Oncology

## 2013-07-28 MED ORDER — ANASTROZOLE 1 MG PO TABS: 1.0000 mg | ORAL_TABLET | Freq: Every day | ORAL | Status: DC

## 2013-07-28 NOTE — Telephone Encounter (Signed)
Received refill request from pt for Arimidex 1mg .  Pt has appt for follow up on 12/20/13.  Spoke with Dr. Alvy Bimler.  Per md, pt can have a 30 day supply with no refill.   Pt to have a return office visit in November 2014 as per md.  POF sent to scheduler by Jill Alexanders, RN. Spoke with pt and informed pt of above info.  Instructed pt to call scheduler for office appt if she had not heard from the clinic in 1 week.  Pt voiced understanding. Addendum :    Arimidex 1mg  was refilled by Dr. Juliann Mule , although Dr. Alvy Bimler will be the new oncologist following pt .

## 2013-07-28 NOTE — Telephone Encounter (Signed)
Received message from pt requesting refill of Arimidex 1mg .  Pt was last seen by Dr. Lamonte Sakai on Sept, 2013.  Pt to have return appt in June 2014.  However, no appt was noted.  Pt does have appt on 12/20/13.  Spoke with Dr. Alvy Bimler.  Per Dr. Alvy Bimler,

## 2013-07-29 ENCOUNTER — Telehealth: Payer: Self-pay | Admitting: Hematology and Oncology

## 2013-07-29 NOTE — Telephone Encounter (Signed)
s.w. pt and advised on NOv appt....pt ok and aware

## 2013-08-05 ENCOUNTER — Other Ambulatory Visit: Payer: Self-pay

## 2013-08-09 ENCOUNTER — Ambulatory Visit (INDEPENDENT_AMBULATORY_CARE_PROVIDER_SITE_OTHER): Payer: Medicare Other | Admitting: Cardiology

## 2013-08-09 ENCOUNTER — Ambulatory Visit (INDEPENDENT_AMBULATORY_CARE_PROVIDER_SITE_OTHER): Payer: Medicare Other | Admitting: *Deleted

## 2013-08-09 ENCOUNTER — Encounter: Payer: Self-pay | Admitting: Cardiology

## 2013-08-09 VITALS — BP 144/85 | HR 83 | Ht 72.0 in | Wt 231.0 lb

## 2013-08-09 DIAGNOSIS — R0602 Shortness of breath: Secondary | ICD-10-CM

## 2013-08-09 DIAGNOSIS — I495 Sick sinus syndrome: Secondary | ICD-10-CM

## 2013-08-09 DIAGNOSIS — Z7901 Long term (current) use of anticoagulants: Secondary | ICD-10-CM

## 2013-08-09 DIAGNOSIS — I2699 Other pulmonary embolism without acute cor pulmonale: Secondary | ICD-10-CM

## 2013-08-09 DIAGNOSIS — I4891 Unspecified atrial fibrillation: Secondary | ICD-10-CM

## 2013-08-09 NOTE — Assessment & Plan Note (Signed)
This is now greatly improved.

## 2013-08-09 NOTE — Progress Notes (Signed)
HPI  Patient is seen today to follow up her cardiac status. She's here to follow diastolic CHF and supraventricular tachycardia and episodes of shortness of breath and swelling of her right lower extremity. When I saw her last she had some concerns about her leg. There was no evidence of deep venous thrombosis. Over several more days she felt poorly and eventually she went to the emergency room. It was felt that she had an ulcer in the toes of her foot and was possibly causing cellulitis. This was treated successfully and she is doing much better.  She also called her daughter with 2 episodes of shortness of breath. On both occasions now she and her daughter think that this was more of a panic attack.  No Known Allergies  Current Outpatient Prescriptions  Medication Sig Dispense Refill  . anastrozole (ARIMIDEX) 1 MG tablet Take 1 tablet (1 mg total) by mouth daily.  90 tablet  1  . bethanechol (URECHOLINE) 25 MG tablet Take 25 mg by mouth daily.      Marland Kitchen dicyclomine (BENTYL) 10 MG capsule Take 10 mg by mouth 2 (two) times daily.      . flecainide (TAMBOCOR) 100 MG tablet Take 100 mg by mouth 2 (two) times daily.      . furosemide (LASIX) 40 MG tablet Take 40 mg by mouth 2 (two) times daily.      . metFORMIN (GLUMETZA) 500 MG (MOD) 24 hr tablet Take 500 mg by mouth daily with breakfast.       . metoprolol succinate (TOPROL-XL) 25 MG 24 hr tablet Take 25 mg by mouth daily.      . potassium chloride 20 MEQ/15ML (10%) solution Take 20 mEq by mouth 2 (two) times daily.      Marland Kitchen pyridoxine (B-6) 100 MG tablet Take 200 mg by mouth daily.       . vitamin B-12 (CYANOCOBALAMIN) 500 MCG tablet Take 500 mcg by mouth daily.       Marland Kitchen warfarin (COUMADIN) 5 MG tablet Take 2.5-5 mg by mouth daily. 5 mg on Sunday and Thursday and all other days are 2.5 mg.       No current facility-administered medications for this visit.    History   Social History  . Marital Status: Widowed    Spouse Name: N/A   Number of Children: 3  . Years of Education: N/A   Occupational History  . Retired     Social History Main Topics  . Smoking status: Former Smoker -- 4.00 packs/day    Quit date: 10/26/1998  . Smokeless tobacco: Never Used  . Alcohol Use: No  . Drug Use: No  . Sexual Activity: Not on file   Other Topics Concern  . Not on file   Social History Narrative   Widowed.  Lives in [place] by herself.  Avoids caffeine.  Very active, taking care of her brother who also lives by himself and is in his late 67's.    Family History  Problem Relation Age of Onset  . Heart disease Mother   . COPD Father   . Colon cancer Neg Hx     Past Medical History  Diagnosis Date  . Hypertension   . Diabetes mellitus   . Tachy-brady syndrome     s/p pacer 07/2009  . Atrial fibrillation     flecainide; coumadin  . Atrial flutter     November, 2010, flecainide, Coumadin  . Chronic diastolic heart failure  echo 10/10: mild LVH, EF 55-60%, mild AI, mild MR, mild to mod LAE, mild RVE, severe RAE, mod TR, PASP 53;  Echo 3/14:  Mild LVH, EF 55%, Tr AI, MAC, mild MR, mod LAE, PASP 31, trivial eff.  . Shortness of breath     myoview 11/10: EF 65%, no ischemia  . Pulmonary HTN     53 mmHg echo, 2010, moderate TR, mild right ventricular enlargement  . DVT (deep venous thrombosis)     Pulmonary embolus  . Pulmonary embolus 2006    2006, with DVT  . Carotid artery disease     [id number]: RICA [id number], LICA [id number]  . Thyroid nodule     non-neoplastic goiter  . Breast cancer   . Ejection fraction     EF 60%, echo, October, 2010  . Warfarin anticoagulation     Atrial fibrillation  . Pacemaker     November, 2010, Dr Rayann Heman - MDT ADDRL1 Adapta DC PPM ser #: [id number]    . S/P laminectomy     June 22, 2011  . Syncope     2010, treated with pacemaker  . Drug therapy     FLECAINIDE  . Mitral regurgitation     mild, echo, October, 2010  . Mediastinal lymphadenopathy   . Venous insufficiency      Chronic  . Hypokalemia     October, 2012, potassium dose increased  . Hiatal hernia 2006    EGD  . Acute gastric ulcer without mention of hemorrhage, perforation, or obstruction 2006    EGD  . Esophagitis, unspecified 2006    EGD  . Allergy     seasonal  . Diverticulosis   . Hx of adenomatous colonic polyps   . Fecal incontinence   . Breast cancer   . Osteopenia     Past Surgical History  Procedure Laterality Date  . Breast surgery      Left  . Abdominal hysterectomy    . Back surgery      1973, 2012   . Ankle surgery      Patient Active Problem List   Diagnosis Date Noted  . Right leg swelling 07/19/2013  . Hypertension 06/21/2013  . Osteopenia   . Hypokalemia   . Tachy-brady syndrome   . Atrial fibrillation   . Atrial flutter   . Chronic diastolic heart failure   . Shortness of breath   . Pulmonary HTN   . DVT (deep venous thrombosis)   . Pulmonary embolus   . Carotid artery disease   . Breast cancer   . Ejection fraction   . Warfarin anticoagulation   . Pacemaker-[employer]   . S/P laminectomy   . Syncope   . Drug therapy   . Mitral regurgitation   . Mediastinal lymphadenopathy   . Venous insufficiency   . Ulcer of toe 04/26/2011  . THYROID NODULE, HX OF 07/19/2009  . DM 06/13/2008  . ALLERGIC RHINITIS 06/13/2008  . MEDIASTINAL LYMPHADENOPATHY 06/13/2008    ROS   Patient denies fever, chills, headache, sweats, rash, change in vision, change in hearing, chest pain, cough, nausea vomiting, urinary symptoms. All other systems are reviewed and are negative.  PHYSICAL EXAM  Patient is oriented to person time and place. Affect is normal. She is here with her daughter. There is no jugular venous distention. Lungs are clear. Respiratory effort is nonlabored. Cardiac exam reveals S1 and S2. There no clicks or significant murmurs. The rhythm is regular. The abdomen is soft. There is  no significant peripheral edema.  Filed Vitals:   08/09/13 1339  BP: 144/85   Pulse: 83  Height: 6' (1.829 m)  Weight: 231 lb (104.781 kg)    EKG  ASSESSMENT & PLAN

## 2013-08-09 NOTE — Assessment & Plan Note (Signed)
It is now clear that some of the time her shortness of breath is related to panic attacks. No further workup.

## 2013-08-09 NOTE — Assessment & Plan Note (Signed)
She is now tolerating her flecainide very well. She's not having any more palpitations. Her pacemaker insurers that she will not become bradycardic.

## 2013-08-09 NOTE — Assessment & Plan Note (Signed)
She continues on Coumadin indefinitely.

## 2013-08-09 NOTE — Patient Instructions (Signed)
Your physician recommends that you continue on your current medications as directed. Please refer to the Current Medication list given to you today.  Your physician wants you to follow-up in: 6 months. You will receive a reminder letter in the mail two months in advance. If you don't receive a letter, please call our office to schedule the follow-up appointment.  

## 2013-08-17 ENCOUNTER — Ambulatory Visit (HOSPITAL_BASED_OUTPATIENT_CLINIC_OR_DEPARTMENT_OTHER): Payer: Medicare Other | Admitting: Hematology and Oncology

## 2013-08-17 ENCOUNTER — Telehealth: Payer: Self-pay | Admitting: Hematology and Oncology

## 2013-08-17 VITALS — BP 137/79 | HR 87 | Temp 97.4°F | Resp 18 | Ht 72.0 in | Wt 229.4 lb

## 2013-08-17 DIAGNOSIS — C50911 Malignant neoplasm of unspecified site of right female breast: Secondary | ICD-10-CM

## 2013-08-17 DIAGNOSIS — C50919 Malignant neoplasm of unspecified site of unspecified female breast: Secondary | ICD-10-CM

## 2013-08-17 NOTE — Progress Notes (Signed)
[place] OFFICE PROGRESS NOTE  Patient Care Team: Foye Spurling, MD as PCP - General (Endocrinology) Nobie Putnam, MD (Hematology and Oncology) Lafayette Dragon, MD (Gastroenterology)  DIAGNOSIS: Left breast cancer, ongoing endocrine therapy  SUMMARY OF ONCOLOGIC HISTORY: Oncology History   Breast cancer   Primary site: Breast (Left)   Clinical free text: IIB   Clinical: Stage IIB (T2, N1, cM0) signed by Heath Lark, MD on 08/17/2013  9:28 AM   Pathologic: Stage IIB (T2, N1, cM0) signed by Heath Lark, MD on 08/17/2013  9:28 AM   Summary: Stage IIB (T2, N1, cM0)      Breast cancer    Initial Diagnosis Breast cancer   01/16/2010 Procedure Needle biopsy confirmed invasive ductal carcinoma, 100% ER+,62% PR+, Her2 by CISH 1.74   02/07/2010 Surgery She had left mastectomy and SLN biopsy, 3.5 x 2 x 2 cm mass, 1 positive LN, Nottingham Grade II,  final staging IIB  Her breast cancer was originally discovered when she had nipple discharge after her dog press on her breast. For some reason, the patient never received adjuvant chemotherapy or radiation therapy.  INTERVAL HISTORY: [name] [name] 79 y.o. female returns for further followup. She tolerated Arimidex well. Denies any mood swings hot flashes. She denies any recent fever, chills, night sweats or abnormal weight loss Denies any probable new lumps or bumps.  I have reviewed the past medical history, past surgical history, social history and family history with the patient and they are unchanged from previous note.  ALLERGIES:  has No Known Allergies.  MEDICATIONS:  Current Outpatient Prescriptions  Medication Sig Dispense Refill  . anastrozole (ARIMIDEX) 1 MG tablet Take 1 tablet (1 mg total) by mouth daily.  90 tablet  1  . bethanechol (URECHOLINE) 25 MG tablet Take 25 mg by mouth daily.      Marland Kitchen dicyclomine (BENTYL) 10 MG capsule Take 10 mg by mouth 2 (two) times daily.      . flecainide (TAMBOCOR) 100 MG tablet Take  100 mg by mouth 2 (two) times daily.      . furosemide (LASIX) 40 MG tablet Take 40 mg by mouth 2 (two) times daily.      . metFORMIN (GLUMETZA) 500 MG (MOD) 24 hr tablet Take 500 mg by mouth daily with breakfast.       . metoprolol succinate (TOPROL-XL) 25 MG 24 hr tablet Take 25 mg by mouth daily.      . potassium chloride 20 MEQ/15ML (10%) solution Take 20 mEq by mouth 2 (two) times daily.      Marland Kitchen pyridoxine (B-6) 100 MG tablet Take 200 mg by mouth daily.       . vitamin B-12 (CYANOCOBALAMIN) 500 MCG tablet Take 500 mcg by mouth daily.       Marland Kitchen warfarin (COUMADIN) 5 MG tablet Take 2.5-5 mg by mouth daily. 5 mg on Sunday and Thursday and all other days are 2.5 mg.       No current facility-administered medications for this visit.    REVIEW OF SYSTEMS:   Constitutional: Denies fevers, chills or abnormal weight loss Eyes: Denies blurriness of vision Ears, nose, mouth, throat, and face: Denies mucositis or sore throat Respiratory: Denies cough, dyspnea or wheezes Cardiovascular: Denies palpitation, chest discomfort or lower extremity swelling Gastrointestinal:  Denies nausea, heartburn or change in bowel habits Skin: Denies abnormal skin rashes Lymphatics: Denies new lymphadenopathy or easy bruising Neurological:Denies numbness, tingling or new weaknesses Behavioral/Psych: Mood is stable,  no new changes  All other systems were reviewed with the patient and are negative.  PHYSICAL EXAMINATION: ECOG PERFORMANCE STATUS: 0 - Asymptomatic  Filed Vitals:   08/17/13 0901  BP: 137/79  Pulse: 87  Temp: 97.4 F (36.3 C)  Resp: 18   Filed Weights   08/17/13 0901  Weight: 229 lb 6.4 oz (104.055 kg)    GENERAL:alert, no distress and comfortable SKIN: skin color, texture, turgor are normal, no rashes or significant lesions EYES: normal, Conjunctiva are pink and non-injected, sclera clear OROPHARYNX:no exudate, no erythema and lips, buccal mucosa, and tongue normal  NECK: supple, thyroid  normal size, non-tender, without nodularity LYMPH:  no palpable lymphadenopathy in the cervical, axillary or inguinal LUNGS: clear to auscultation and percussion with normal breathing effort HEART: regular rate & rhythm and no murmurs and no lower extremity edema ABDOMEN:abdomen soft, non-tender and normal bowel sounds Musculoskeletal:no cyanosis of digits and no clubbing  NEURO: alert & oriented x 3 with fluent speech, no focal motor/sensory deficits  bilateral breast examinations were performed. No palpable mass on the right breast. On the left, well-healed mastectomy scar with no abnormalities LABORATORY DATA:  I have reviewed the data as listed    Component Value Date/Time   NA 135 07/21/2013 0827   NA 139 03/22/2013 1015   NA 140 10/14/2011 0937   K 3.7 07/21/2013 0827   K 3.7 03/22/2013 1015   K 4.3 10/14/2011 0937   CL 93* 07/21/2013 0827   CL 101 03/22/2013 1015   CL 100 10/14/2011 0937   CO2 28 07/21/2013 0827   CO2 26 03/22/2013 1015   CO2 29 10/14/2011 0937   GLUCOSE 162* 07/21/2013 0827   GLUCOSE 197* 03/22/2013 1015   GLUCOSE 268* 10/14/2011 0937   BUN 21 07/21/2013 0827   BUN 23.2 03/22/2013 1015   BUN 14 10/14/2011 0937   CREATININE 1.30* 07/21/2013 0827   CREATININE 1.3* 03/22/2013 1015   CREATININE 1.2 10/14/2011 0937   CALCIUM 10.1 07/21/2013 0827   CALCIUM 9.8 03/22/2013 1015   CALCIUM 9.2 10/14/2011 0937   PROT 7.6 03/22/2013 1015   PROT 7.2 10/14/2011 0937   PROT 7.2 07/18/2011 1100   ALBUMIN 3.5 03/22/2013 1015   ALBUMIN 4.1 07/18/2011 1100   AST 16 03/22/2013 1015   AST 15 10/14/2011 0937   AST 29 07/18/2011 1100   ALT 12 03/22/2013 1015   ALT 17 10/14/2011 0937   ALT 19 07/18/2011 1100   ALKPHOS 102 03/22/2013 1015   ALKPHOS 109* 10/14/2011 0937   ALKPHOS 103 07/18/2011 1100   BILITOT 0.60 03/22/2013 1015   BILITOT 0.50 10/14/2011 0937   BILITOT 0.6 07/18/2011 1100   GFRNONAA 38* 07/21/2013 0827   GFRAA 44* 07/21/2013 0827    No results found for this basename:  SPEP, UPEP,  kappa and lambda light chains    Lab Results  Component Value Date   WBC 3.9* 07/21/2013   NEUTROABS 2.7 07/21/2013   HGB 12.6 07/21/2013   HCT 36.7 07/21/2013   MCV 87.8 07/21/2013   PLT 264 07/21/2013      Chemistry      Component Value Date/Time   NA 135 07/21/2013 0827   NA 139 03/22/2013 1015   NA 140 10/14/2011 0937   K 3.7 07/21/2013 0827   K 3.7 03/22/2013 1015   K 4.3 10/14/2011 0937   CL 93* 07/21/2013 0827   CL 101 03/22/2013 1015   CL 100 10/14/2011 0937   CO2 28 07/21/2013  0827   CO2 26 03/22/2013 1015   CO2 29 10/14/2011 0937   BUN 21 07/21/2013 0827   BUN 23.2 03/22/2013 1015   BUN 14 10/14/2011 0937   CREATININE 1.30* 07/21/2013 0827   CREATININE 1.3* 03/22/2013 1015   CREATININE 1.2 10/14/2011 0937      Component Value Date/Time   CALCIUM 10.1 07/21/2013 0827   CALCIUM 9.8 03/22/2013 1015   CALCIUM 9.2 10/14/2011 0937   ALKPHOS 102 03/22/2013 1015   ALKPHOS 109* 10/14/2011 0937   ALKPHOS 103 07/18/2011 1100   AST 16 03/22/2013 1015   AST 15 10/14/2011 0937   AST 29 07/18/2011 1100   ALT 12 03/22/2013 1015   ALT 17 10/14/2011 0937   ALT 19 07/18/2011 1100   BILITOT 0.60 03/22/2013 1015   BILITOT 0.50 10/14/2011 0937   BILITOT 0.6 07/18/2011 1100       RADIOGRAPHIC STUDIES: I have personally reviewed the radiological  findings in the report. Her last mammogram detected some abnormalities on the right breast and followup imaging study in 3 months was recommended. Her last bone density from September 2013 show osteo porosis   ASSESSMENT & PLAN:  #1 left breast cancer The patient never received adjuvant chemotherapy or radiation therapy. She is comfortable with adjuvant endocrine therapy. She had no side effects from anastrozole. Last mammogram from May show the nodule and followup was recommended but the patient declined. We will just do a mammogram in 6 months per her request #2 osteoporosis She's not taking enough calcium or vitamin D  supplements. I recommend her to increase the supplement to 2 a day. If her bone density get worse in the future, she might need bisphosphonate therapy  All questions were answered. The patient knows to call the clinic with any problems, questions or concerns. No barriers to learning was detected. I spent 15 minutes counseling the patient face to face. The total time spent in the appointment was 20 minutes and more than 50% was on counseling and review of test results     [facility], [place], MD 08/17/2013 9:31 AM

## 2013-08-17 NOTE — Telephone Encounter (Signed)
Gave pt appt for MD visit on May 2014

## 2013-08-24 ENCOUNTER — Ambulatory Visit (INDEPENDENT_AMBULATORY_CARE_PROVIDER_SITE_OTHER): Payer: Medicare Other | Admitting: *Deleted

## 2013-08-24 DIAGNOSIS — Z7901 Long term (current) use of anticoagulants: Secondary | ICD-10-CM

## 2013-08-24 DIAGNOSIS — I4891 Unspecified atrial fibrillation: Secondary | ICD-10-CM

## 2013-08-24 DIAGNOSIS — I2699 Other pulmonary embolism without acute cor pulmonale: Secondary | ICD-10-CM

## 2013-09-02 ENCOUNTER — Emergency Department (HOSPITAL_COMMUNITY)
Admission: EM | Admit: 2013-09-02 | Discharge: 2013-09-02 | Disposition: A | Discharge status: Home / Self Care | Payer: Medicare Other | Attending: Emergency Medicine | Admitting: Emergency Medicine

## 2013-09-02 ENCOUNTER — Encounter (HOSPITAL_COMMUNITY): Payer: Self-pay | Admitting: Emergency Medicine

## 2013-09-02 DIAGNOSIS — I4892 Unspecified atrial flutter: Secondary | ICD-10-CM | POA: Insufficient documentation

## 2013-09-02 DIAGNOSIS — K5289 Other specified noninfective gastroenteritis and colitis: Secondary | ICD-10-CM | POA: Insufficient documentation

## 2013-09-02 DIAGNOSIS — Z860100 Personal history of colon polyps, unspecified: Secondary | ICD-10-CM | POA: Insufficient documentation

## 2013-09-02 DIAGNOSIS — Z853 Personal history of malignant neoplasm of breast: Secondary | ICD-10-CM | POA: Insufficient documentation

## 2013-09-02 DIAGNOSIS — E119 Type 2 diabetes mellitus without complications: Secondary | ICD-10-CM | POA: Insufficient documentation

## 2013-09-02 DIAGNOSIS — Z9889 Other specified postprocedural states: Secondary | ICD-10-CM | POA: Insufficient documentation

## 2013-09-02 DIAGNOSIS — Z86711 Personal history of pulmonary embolism: Secondary | ICD-10-CM | POA: Insufficient documentation

## 2013-09-02 DIAGNOSIS — K529 Noninfective gastroenteritis and colitis, unspecified: Secondary | ICD-10-CM

## 2013-09-02 DIAGNOSIS — I4891 Unspecified atrial fibrillation: Secondary | ICD-10-CM | POA: Insufficient documentation

## 2013-09-02 DIAGNOSIS — I10 Essential (primary) hypertension: Secondary | ICD-10-CM | POA: Insufficient documentation

## 2013-09-02 DIAGNOSIS — Z79899 Other long term (current) drug therapy: Secondary | ICD-10-CM | POA: Insufficient documentation

## 2013-09-02 DIAGNOSIS — Z87891 Personal history of nicotine dependence: Secondary | ICD-10-CM | POA: Insufficient documentation

## 2013-09-02 DIAGNOSIS — Z7901 Long term (current) use of anticoagulants: Secondary | ICD-10-CM | POA: Insufficient documentation

## 2013-09-02 DIAGNOSIS — Z950 Presence of cardiac pacemaker: Secondary | ICD-10-CM | POA: Insufficient documentation

## 2013-09-02 DIAGNOSIS — Z86718 Personal history of other venous thrombosis and embolism: Secondary | ICD-10-CM | POA: Insufficient documentation

## 2013-09-02 DIAGNOSIS — I5032 Chronic diastolic (congestive) heart failure: Secondary | ICD-10-CM | POA: Insufficient documentation

## 2013-09-02 DIAGNOSIS — Z8739 Personal history of other diseases of the musculoskeletal system and connective tissue: Secondary | ICD-10-CM | POA: Insufficient documentation

## 2013-09-02 DIAGNOSIS — E876 Hypokalemia: Secondary | ICD-10-CM | POA: Insufficient documentation

## 2013-09-02 LAB — CBC WITH DIFFERENTIAL/PLATELET
Basophils Absolute: 0 10*3/uL (ref 0.0–0.1)
Basophils Relative: 0 % (ref 0–1)
Eosinophils Absolute: 0.1 10*3/uL (ref 0.0–0.7)
Eosinophils Relative: 1 % (ref 0–5)
HCT: 38.3 % (ref 36.0–46.0)
Hemoglobin: 12.9 g/dL (ref 12.0–15.0)
Lymphocytes Relative: 6 % — ABNORMAL LOW (ref 12–46)
Lymphs Abs: 0.8 10*3/uL (ref 0.7–4.0)
MCH: 30 pg (ref 26.0–34.0)
MCHC: 33.7 g/dL (ref 30.0–36.0)
MCV: 89.1 fL (ref 78.0–100.0)
Monocytes Absolute: 0.7 10*3/uL (ref 0.1–1.0)
Monocytes Relative: 5 % (ref 3–12)
Neutro Abs: 13.2 10*3/uL — ABNORMAL HIGH (ref 1.7–7.7)
Neutrophils Relative %: 89 % — ABNORMAL HIGH (ref 43–77)
Platelets: 314 10*3/uL (ref 150–400)
RBC: 4.3 MIL/uL (ref 3.87–5.11)
RDW: 14.3 % (ref 11.5–15.5)
WBC: 14.8 10*3/uL — ABNORMAL HIGH (ref 4.0–10.5)

## 2013-09-02 LAB — COMPREHENSIVE METABOLIC PANEL
ALT: 10 U/L (ref 0–35)
AST: 17 U/L (ref 0–37)
Albumin: 3.7 g/dL (ref 3.5–5.2)
Alkaline Phosphatase: 110 U/L (ref 39–117)
BUN: 27 mg/dL — ABNORMAL HIGH (ref 6–23)
CO2: 26 mEq/L (ref 19–32)
Calcium: 9.7 mg/dL (ref 8.4–10.5)
Chloride: 100 mEq/L (ref 96–112)
Creatinine, Ser: 1.6 mg/dL — ABNORMAL HIGH (ref 0.50–1.10)
GFR calc Af Amer: 34 mL/min — ABNORMAL LOW (ref >90)
GFR calc non Af Amer: 29 mL/min — ABNORMAL LOW (ref >90)
Glucose, Bld: 149 mg/dL — ABNORMAL HIGH (ref 70–99)
Potassium: 3.9 mEq/L (ref 3.5–5.1)
Sodium: 140 mEq/L (ref 135–145)
Total Bilirubin: 0.5 mg/dL (ref 0.3–1.2)
Total Protein: 7.5 g/dL (ref 6.0–8.3)

## 2013-09-02 MED ORDER — SODIUM CHLORIDE 0.9 % IV BOLUS (SEPSIS)
1000.0000 mL | Freq: Once | INTRAVENOUS | Status: AC
  Administered 2013-09-02: 1000 mL via INTRAVENOUS

## 2013-09-02 MED ORDER — PROMETHAZINE HCL 25 MG PO TABS: 25.0000 mg | ORAL_TABLET | Freq: Three times a day (TID) | ORAL | Status: DC | PRN

## 2013-09-02 MED ORDER — ONDANSETRON HCL 4 MG/2ML IJ SOLN
4.0000 mg | Freq: Once | INTRAMUSCULAR | Status: AC
  Administered 2013-09-02: 4 mg via INTRAVENOUS
  Filled 2013-09-02: qty 2

## 2013-09-02 NOTE — ED Provider Notes (Signed)
CSN: [id number]     Arrival date & time 09/02/13  1628 History   First MD Initiated Contact with Patient 09/02/13 1633     Chief Complaint  Patient presents with  . Emesis  . Diarrhea   (Consider location/radiation/quality/duration/timing/severity/associated sxs/prior Treatment) HPI Patient presents emergency Department, nausea, vomiting, diarrhea, following lunch.  Patient, states she ate left over from Thanksgiving, which was 1 week ago.  Patient, states, that she denies any fevers, chest pain, shortness of breath, pain, dysuria, back pain, weakness, dizziness, or syncope.  The patient, states, that nothing seems to make her condition, better or worse.  Patient did not take any medications prior to arrival Past Medical History  Diagnosis Date  . Hypertension   . Diabetes mellitus   . Tachy-brady syndrome     s/p pacer 07/2009  . Atrial fibrillation     flecainide; coumadin  . Atrial flutter     November, 2010, flecainide, Coumadin  . Chronic diastolic heart failure     echo 10/10: mild LVH, EF 55-60%, mild AI, mild MR, mild to mod LAE, mild RVE, severe RAE, mod TR, PASP 53;  Echo 3/14:  Mild LVH, EF 55%, Tr AI, MAC, mild MR, mod LAE, PASP 31, trivial eff.  . Shortness of breath     myoview 11/10: EF 65%, no ischemia  . Pulmonary HTN     53 mmHg echo, 2010, moderate TR, mild right ventricular enlargement  . DVT (deep venous thrombosis)     Pulmonary embolus  . Pulmonary embolus 2006    2006, with DVT  . Carotid artery disease     [id number]: RICA [id number], LICA [id number]  . Thyroid nodule     non-neoplastic goiter  . Breast cancer   . Ejection fraction     EF 60%, echo, October, 2010  . Warfarin anticoagulation     Atrial fibrillation  . Pacemaker     November, 2010, Dr Rayann Heman - MDT ADDRL1 Adapta DC PPM ser #: [id number]    . S/P laminectomy     June 22, 2011  . Syncope     2010, treated with pacemaker  . Drug therapy     FLECAINIDE  . Mitral regurgitation     mild, echo,  October, 2010  . Mediastinal lymphadenopathy   . Venous insufficiency     Chronic  . Hypokalemia     October, 2012, potassium dose increased  . Hiatal hernia 2006    EGD  . Acute gastric ulcer without mention of hemorrhage, perforation, or obstruction 2006    EGD  . Esophagitis, unspecified 2006    EGD  . Allergy     seasonal  . Diverticulosis   . Hx of adenomatous colonic polyps   . Fecal incontinence   . Breast cancer   . Osteopenia    Past Surgical History  Procedure Laterality Date  . Breast surgery      Left  . Abdominal hysterectomy    . Back surgery      1973, 2012   . Ankle surgery     Family History  Problem Relation Age of Onset  . Heart disease Mother   . COPD Father   . Colon cancer Neg Hx    History  Substance Use Topics  . Smoking status: Former Smoker -- 4.00 packs/day    Quit date: 10/26/1998  . Smokeless tobacco: Never Used  . Alcohol Use: No   OB History   Grav Para Term Preterm Abortions TAB SAB  Ect Mult Living                 Review of Systems  Allergies  Review of patient's allergies indicates no known allergies.  Home Medications   Current Outpatient Rx  Name  Route  Sig  Dispense  Refill  . anastrozole (ARIMIDEX) 1 MG tablet   Oral   Take 1 tablet (1 mg total) by mouth daily.   90 tablet   1   . bethanechol (URECHOLINE) 25 MG tablet   Oral   Take 25 mg by mouth daily.         . cephALEXin (KEFLEX) 500 MG capsule      500 mg 3 (three) times daily.          Marland Kitchen dicyclomine (BENTYL) 10 MG capsule   Oral   Take 10 mg by mouth 2 (two) times daily.         . flecainide (TAMBOCOR) 100 MG tablet   Oral   Take 100 mg by mouth 2 (two) times daily.         . furosemide (LASIX) 40 MG tablet   Oral   Take 40 mg by mouth 2 (two) times daily.         . metFORMIN (GLUMETZA) 500 MG (MOD) 24 hr tablet   Oral   Take 500 mg by mouth daily with breakfast.          . metoprolol succinate (TOPROL-XL) 25 MG 24 hr tablet    Oral   Take 25 mg by mouth daily.         . potassium chloride 20 MEQ/15ML (10%) solution   Oral   Take 20 mEq by mouth 2 (two) times daily.         Marland Kitchen pyridoxine (B-6) 100 MG tablet   Oral   Take 200 mg by mouth daily.          . vitamin B-12 (CYANOCOBALAMIN) 500 MCG tablet   Oral   Take 500 mcg by mouth daily.          Marland Kitchen warfarin (COUMADIN) 5 MG tablet   Oral   Take 2.5-5 mg by mouth daily. 5 mg on Sunday and Thursday and all other days are 2.5 mg.          BP 124/63  Pulse 60  Temp(Src) 98.9 F (37.2 C) (Oral)  Resp 18  SpO2 94% Physical Exam  Nursing note and vitals reviewed. Constitutional: She is oriented to person, place, and time. She appears well-developed and well-nourished. No distress.  HENT:  Head: Normocephalic and atraumatic.  Mouth/Throat: Oropharynx is clear and moist.  Eyes: Pupils are equal, round, and reactive to light.  Neck: Normal range of motion. Neck supple.  Cardiovascular: Normal rate, regular rhythm and normal heart sounds.  Exam reveals no gallop and no friction rub.   No murmur heard. Pulmonary/Chest: Effort normal and breath sounds normal. No respiratory distress. She has no wheezes.  Abdominal: Soft. Bowel sounds are normal. She exhibits no distension. There is no tenderness. There is no guarding.  Neurological: She is alert and oriented to person, place, and time. She exhibits normal muscle tone. Coordination normal.  Skin: Skin is warm and dry.    ED Course  Procedures (including critical care time) Labs Review Labs Reviewed  CBC WITH DIFFERENTIAL - Abnormal; Notable for the following:    WBC 14.8 (*)    Neutrophils Relative % 89 (*)    Neutro Abs 13.2 (*)  Lymphocytes Relative 6 (*)    All other components within normal limits  COMPREHENSIVE METABOLIC PANEL - Abnormal; Notable for the following:    Glucose, Bld 149 (*)    BUN 27 (*)    Creatinine, Ser 1.60 (*)    GFR calc non Af Amer 29 (*)    GFR calc Af Amer 34  (*)    All other components within normal limits  URINALYSIS, ROUTINE W REFLEX MICROSCOPIC   Patient is feeling vastly improved following IV fluids and antiemetics.  The patient was able to tolerate oral fluids.  Patient is advised return here as needed.  Also advised followup with her primary care Dr. for recheck.   Brent General, PA-C 09/03/13 [phone]

## 2013-09-02 NOTE — ED Notes (Signed)
Per EMS pt ate Thanksgiving leftovers for lunch around 1330. Pt reports multiple episodes of nausea/vomitting/diarrhea. Pt. is on Warfarin. EMS attempted 3 IVs. Pt. denied SOB, chest pain, and syncope en route. Hx of left breast mastectomy. Hx DM and wound on right foot currently being treated. BP 160/90 HR 60 paced (internal pacemaker).

## 2013-09-02 NOTE — ED Notes (Signed)
Pt is unable to uriante at this time will try later

## 2013-09-02 NOTE — ED Notes (Signed)
Pt given cup of water, tolerated w/o difficulty and asked for another.  Pt still unable to void at this time.

## 2013-09-02 NOTE — ED Notes (Signed)
Bed: [id number] Expected date:  Expected time:  Means of arrival:  Comments: EMS-forgot complaint

## 2013-09-05 NOTE — ED Provider Notes (Signed)
Medical screening examination/treatment/procedure(s) were performed by non-physician practitioner and as supervising physician I was immediately available for consultation/collaboration.  EKG Interpretation   None        Jasper Riling. Alvino Chapel, MD 09/05/13 1048

## 2013-09-14 ENCOUNTER — Ambulatory Visit (INDEPENDENT_AMBULATORY_CARE_PROVIDER_SITE_OTHER): Payer: Medicare Other | Admitting: General Practice

## 2013-09-14 DIAGNOSIS — I2699 Other pulmonary embolism without acute cor pulmonale: Secondary | ICD-10-CM

## 2013-09-14 DIAGNOSIS — Z7901 Long term (current) use of anticoagulants: Secondary | ICD-10-CM

## 2013-09-14 DIAGNOSIS — I4891 Unspecified atrial fibrillation: Secondary | ICD-10-CM

## 2013-09-14 LAB — POCT INR: INR: 4.3

## 2013-09-19 ENCOUNTER — Emergency Department (HOSPITAL_COMMUNITY): Payer: Medicare Other

## 2013-09-19 ENCOUNTER — Encounter (HOSPITAL_COMMUNITY): Payer: Self-pay | Admitting: Emergency Medicine

## 2013-09-19 ENCOUNTER — Emergency Department (HOSPITAL_COMMUNITY)
Admission: EM | Admit: 2013-09-19 | Discharge: 2013-09-19 | Disposition: A | Discharge status: Home / Self Care | Payer: Medicare Other | Attending: Emergency Medicine | Admitting: Emergency Medicine

## 2013-09-19 DIAGNOSIS — Z7901 Long term (current) use of anticoagulants: Secondary | ICD-10-CM | POA: Insufficient documentation

## 2013-09-19 DIAGNOSIS — I2789 Other specified pulmonary heart diseases: Secondary | ICD-10-CM | POA: Insufficient documentation

## 2013-09-19 DIAGNOSIS — Z8679 Personal history of other diseases of the circulatory system: Secondary | ICD-10-CM | POA: Insufficient documentation

## 2013-09-19 DIAGNOSIS — Z950 Presence of cardiac pacemaker: Secondary | ICD-10-CM | POA: Insufficient documentation

## 2013-09-19 DIAGNOSIS — Z79899 Other long term (current) drug therapy: Secondary | ICD-10-CM | POA: Insufficient documentation

## 2013-09-19 DIAGNOSIS — R110 Nausea: Secondary | ICD-10-CM | POA: Insufficient documentation

## 2013-09-19 DIAGNOSIS — E119 Type 2 diabetes mellitus without complications: Secondary | ICD-10-CM | POA: Insufficient documentation

## 2013-09-19 DIAGNOSIS — I10 Essential (primary) hypertension: Secondary | ICD-10-CM | POA: Insufficient documentation

## 2013-09-19 DIAGNOSIS — Z87891 Personal history of nicotine dependence: Secondary | ICD-10-CM | POA: Insufficient documentation

## 2013-09-19 DIAGNOSIS — R109 Unspecified abdominal pain: Secondary | ICD-10-CM | POA: Insufficient documentation

## 2013-09-19 LAB — CBC WITH DIFFERENTIAL/PLATELET
Basophils Absolute: 0 10*3/uL (ref 0.0–0.1)
Eosinophils Relative: 0 % (ref 0–5)
HCT: 40 % (ref 36.0–46.0)
Lymphocytes Relative: 5 % — ABNORMAL LOW (ref 12–46)
Lymphs Abs: 0.5 10*3/uL — ABNORMAL LOW (ref 0.7–4.0)
MCHC: 34 g/dL (ref 30.0–36.0)
MCV: 88.9 fL (ref 78.0–100.0)
Neutro Abs: 8.2 10*3/uL — ABNORMAL HIGH (ref 1.7–7.7)
Neutrophils Relative %: 90 % — ABNORMAL HIGH (ref 43–77)
Platelets: 285 10*3/uL (ref 150–400)
RBC: 4.5 MIL/uL (ref 3.87–5.11)
RDW: 14 % (ref 11.5–15.5)
WBC: 9.1 10*3/uL (ref 4.0–10.5)

## 2013-09-19 LAB — COMPREHENSIVE METABOLIC PANEL
ALT: 15 U/L (ref 0–35)
AST: 19 U/L (ref 0–37)
Albumin: 3.9 g/dL (ref 3.5–5.2)
Alkaline Phosphatase: 119 U/L — ABNORMAL HIGH (ref 39–117)
BUN: 30 mg/dL — ABNORMAL HIGH (ref 6–23)
CO2: 29 mEq/L (ref 19–32)
Chloride: 96 mEq/L (ref 96–112)
GFR calc Af Amer: 37 mL/min — ABNORMAL LOW (ref >90)
GFR calc non Af Amer: 32 mL/min — ABNORMAL LOW (ref >90)
Glucose, Bld: 174 mg/dL — ABNORMAL HIGH (ref 70–99)
Potassium: 3.8 mEq/L (ref 3.5–5.1)
Sodium: 138 mEq/L (ref 135–145)
Total Bilirubin: 0.5 mg/dL (ref 0.3–1.2)

## 2013-09-19 MED ORDER — POLYETHYLENE GLYCOL 3350 17 G PO PACK: 17.0000 g | PACK | Freq: Every day | ORAL | Status: DC

## 2013-09-19 NOTE — ED Notes (Signed)
Pt had large bowel movement upon arriving.  Pt now reports pain that was in lower abdomen is now gone but complaining of nausea.  Pt is cool and diaphoretic.  Pt complains of hot and cold spells.

## 2013-09-19 NOTE — ED Notes (Signed)
EMS reports patient having abdominal pain since 1030am.  Denies vomiting.  Pt reports nausea.

## 2013-09-19 NOTE — ED Provider Notes (Signed)
CSN: [id number]     Arrival date & time 09/19/13  1250 History   First MD Initiated Contact with Patient 09/19/13 1319     Chief Complaint  Patient presents with  . Abdominal Pain    HPI Patient presents with abdominal discomfort since around 10:30 this morning no vomiting but some nausea.  Patient denies constipation but after getting to the emergency department she had a.very large bowel movement and her pain went completely away.  Patient still with some mild nausea but otherwise feels much improved.  Patient denies fever.  Her appetite been good. Past Medical History  Diagnosis Date  . Hypertension   . Diabetes mellitus   . Tachy-brady syndrome     s/p pacer 07/2009  . Atrial fibrillation     flecainide; coumadin  . Atrial flutter     November, 2010, flecainide, Coumadin  . Chronic diastolic heart failure     echo 10/10: mild LVH, EF 55-60%, mild AI, mild MR, mild to mod LAE, mild RVE, severe RAE, mod TR, PASP 53;  Echo 3/14:  Mild LVH, EF 55%, Tr AI, MAC, mild MR, mod LAE, PASP 31, trivial eff.  . Shortness of breath     myoview 11/10: EF 65%, no ischemia  . Pulmonary HTN     53 mmHg echo, 2010, moderate TR, mild right ventricular enlargement  . DVT (deep venous thrombosis)     Pulmonary embolus  . Pulmonary embolus 2006    2006, with DVT  . Carotid artery disease     [id number]: RICA [id number], LICA [id number]  . Thyroid nodule     non-neoplastic goiter  . Breast cancer   . Ejection fraction     EF 60%, echo, October, 2010  . Warfarin anticoagulation     Atrial fibrillation  . Pacemaker     November, 2010, Dr Rayann Heman - MDT ADDRL1 Adapta DC PPM ser #: [id number]    . S/P laminectomy     June 22, 2011  . Syncope     2010, treated with pacemaker  . Drug therapy     FLECAINIDE  . Mitral regurgitation     mild, echo, October, 2010  . Mediastinal lymphadenopathy   . Venous insufficiency     Chronic  . Hypokalemia     October, 2012, potassium dose increased  . Hiatal  hernia 2006    EGD  . Acute gastric ulcer without mention of hemorrhage, perforation, or obstruction 2006    EGD  . Esophagitis, unspecified 2006    EGD  . Allergy     seasonal  . Diverticulosis   . Hx of adenomatous colonic polyps   . Fecal incontinence   . Breast cancer   . Osteopenia    Past Surgical History  Procedure Laterality Date  . Breast surgery      Left  . Abdominal hysterectomy    . Back surgery      1973, 2012   . Ankle surgery     Family History  Problem Relation Age of Onset  . Heart disease Mother   . COPD Father   . Colon cancer Neg Hx    History  Substance Use Topics  . Smoking status: Former Smoker -- 4.00 packs/day    Quit date: 10/26/1998  . Smokeless tobacco: Never Used  . Alcohol Use: No   OB History   Grav Para Term Preterm Abortions TAB SAB Ect Mult Living  Review of Systems  Allergies  Review of patient's allergies indicates no known allergies.  Home Medications   Current Outpatient Rx  Name  Route  Sig  Dispense  Refill  . anastrozole (ARIMIDEX) 1 MG tablet   Oral   Take 1 tablet (1 mg total) by mouth daily.   90 tablet   1   . bethanechol (URECHOLINE) 25 MG tablet   Oral   Take 25 mg by mouth daily.         Marland Kitchen dicyclomine (BENTYL) 10 MG capsule   Oral   Take 10 mg by mouth 2 (two) times daily.         . flecainide (TAMBOCOR) 100 MG tablet   Oral   Take 100 mg by mouth 2 (two) times daily.         . furosemide (LASIX) 40 MG tablet   Oral   Take 40 mg by mouth 2 (two) times daily.         . metFORMIN (GLUCOPHAGE) 500 MG tablet   Oral   Take 750 mg by mouth 2 (two) times daily with a meal.         . metoprolol succinate (TOPROL-XL) 25 MG 24 hr tablet   Oral   Take 25 mg by mouth daily.         . potassium chloride 20 MEQ/15ML (10%) solution   Oral   Take 20 mEq by mouth 2 (two) times daily.         . promethazine (PHENERGAN) 25 MG tablet   Oral   Take 1 tablet (25 mg total) by  mouth every 8 (eight) hours as needed for nausea or vomiting.   15 tablet   0   . pyridoxine (B-6) 100 MG tablet   Oral   Take 200 mg by mouth daily.          . vitamin B-12 (CYANOCOBALAMIN) 500 MCG tablet   Oral   Take 500 mcg by mouth daily.          Marland Kitchen warfarin (COUMADIN) 5 MG tablet   Oral   Take 2.5-5 mg by mouth daily. 5 mg on Sunday and Thursday and all other days are 2.5 mg.         . polyethylene glycol (MIRALAX) packet   Oral   Take 17 g by mouth daily.   14 each   0    BP 124/56  Pulse 70  Temp(Src) 98 F (36.7 C) (Oral)  Resp 18  Ht 6' (1.829 m)  Wt 215 lb (97.523 kg)  BMI 29.15 kg/m2  SpO2 98% Physical Exam  Nursing note and vitals reviewed. Constitutional: She is oriented to person, place, and time. She appears well-developed and well-nourished. No distress.  HENT:  Head: Normocephalic and atraumatic.  Eyes: Pupils are equal, round, and reactive to light.  Neck: Normal range of motion.  Cardiovascular: Normal rate and intact distal pulses.   Pulmonary/Chest: No respiratory distress.  Abdominal: Normal appearance. She exhibits no distension. There is no tenderness. There is no rebound and no guarding.  Musculoskeletal: Normal range of motion.  Neurological: She is alert and oriented to person, place, and time. No cranial nerve deficit.  Skin: Skin is warm and dry. No rash noted.  Psychiatric: She has a normal mood and affect. Her behavior is normal.    ED Course  Procedures (including critical care time) Labs Review Labs Reviewed  CBC WITH DIFFERENTIAL - Abnormal; Notable for the following:  Neutrophils Relative % 90 (*)    Neutro Abs 8.2 (*)    Lymphocytes Relative 5 (*)    Lymphs Abs 0.5 (*)    All other components within normal limits  COMPREHENSIVE METABOLIC PANEL - Abnormal; Notable for the following:    Glucose, Bld 174 (*)    BUN 30 (*)    Creatinine, Ser 1.48 (*)    Alkaline Phosphatase 119 (*)    GFR calc non Af Amer 32 (*)     GFR calc Af Amer 37 (*)    All other components within normal limits   Imaging Review Dg Abd 1 View  09/19/2013   CLINICAL DATA:  Lower abdominal pain  EXAM: ABDOMEN - 1 VIEW  COMPARISON:  None.  FINDINGS: Osteopenia. Gas-filled loops of small and large bowel are scattered across the abdomen without disproportionate dilatation. Retained Pantopaque in the sacral canal.  IMPRESSION: Nonobstructive bowel gas pattern.   Electronically Signed   By: Maryclare Bean M.D.   On: 09/19/2013 15:07    Medications - No data to display   MDM   1. Abdominal pain        Dot Lanes, MD 09/19/13 1537

## 2013-09-19 NOTE — ED Notes (Signed)
Bed: [id number] Expected date: 09/19/13 Expected time: 12:54 PM Means of arrival:  Comments: abd pain

## 2013-09-19 NOTE — ED Notes (Signed)
Pt reports lower abdominal pain that started around 1030 am.  Pt reports hot and cold sweats.

## 2013-09-28 ENCOUNTER — Ambulatory Visit (INDEPENDENT_AMBULATORY_CARE_PROVIDER_SITE_OTHER): Payer: Medicare Other

## 2013-09-28 DIAGNOSIS — Z7901 Long term (current) use of anticoagulants: Secondary | ICD-10-CM

## 2013-09-28 DIAGNOSIS — I4891 Unspecified atrial fibrillation: Secondary | ICD-10-CM

## 2013-09-28 DIAGNOSIS — I2699 Other pulmonary embolism without acute cor pulmonale: Secondary | ICD-10-CM

## 2013-10-19 ENCOUNTER — Ambulatory Visit (INDEPENDENT_AMBULATORY_CARE_PROVIDER_SITE_OTHER): Payer: Private Health Insurance - Indemnity | Admitting: *Deleted

## 2013-10-19 DIAGNOSIS — I2699 Other pulmonary embolism without acute cor pulmonale: Secondary | ICD-10-CM

## 2013-10-19 DIAGNOSIS — Z7901 Long term (current) use of anticoagulants: Secondary | ICD-10-CM

## 2013-10-19 DIAGNOSIS — I4891 Unspecified atrial fibrillation: Secondary | ICD-10-CM

## 2013-10-19 LAB — POCT INR: INR: 3.3

## 2013-10-23 ENCOUNTER — Other Ambulatory Visit (HOSPITAL_COMMUNITY): Payer: Self-pay | Admitting: Cardiology

## 2013-10-30 ENCOUNTER — Other Ambulatory Visit: Payer: Self-pay | Admitting: Cardiology

## 2013-10-31 ENCOUNTER — Other Ambulatory Visit: Payer: Self-pay | Admitting: Oncology

## 2013-11-04 ENCOUNTER — Ambulatory Visit (INDEPENDENT_AMBULATORY_CARE_PROVIDER_SITE_OTHER): Payer: Private Health Insurance - Indemnity | Admitting: *Deleted

## 2013-11-04 DIAGNOSIS — Z7189 Other specified counseling: Secondary | ICD-10-CM | POA: Insufficient documentation

## 2013-11-04 DIAGNOSIS — Z7901 Long term (current) use of anticoagulants: Secondary | ICD-10-CM

## 2013-11-04 DIAGNOSIS — I2699 Other pulmonary embolism without acute cor pulmonale: Secondary | ICD-10-CM

## 2013-11-04 DIAGNOSIS — I4891 Unspecified atrial fibrillation: Secondary | ICD-10-CM

## 2013-11-04 DIAGNOSIS — Z5181 Encounter for therapeutic drug level monitoring: Secondary | ICD-10-CM

## 2013-11-04 LAB — POCT INR: INR: 2

## 2013-11-26 ENCOUNTER — Other Ambulatory Visit: Payer: Self-pay | Admitting: Cardiology

## 2013-11-26 ENCOUNTER — Other Ambulatory Visit: Payer: Self-pay | Admitting: *Deleted

## 2013-11-29 ENCOUNTER — Ambulatory Visit (INDEPENDENT_AMBULATORY_CARE_PROVIDER_SITE_OTHER): Payer: Private Health Insurance - Indemnity | Admitting: Pharmacist

## 2013-11-29 DIAGNOSIS — Z7901 Long term (current) use of anticoagulants: Secondary | ICD-10-CM

## 2013-11-29 DIAGNOSIS — Z5181 Encounter for therapeutic drug level monitoring: Secondary | ICD-10-CM

## 2013-11-29 DIAGNOSIS — I2699 Other pulmonary embolism without acute cor pulmonale: Secondary | ICD-10-CM

## 2013-11-29 DIAGNOSIS — I4891 Unspecified atrial fibrillation: Secondary | ICD-10-CM

## 2013-11-29 LAB — POCT INR: INR: 4.8

## 2013-12-11 ENCOUNTER — Other Ambulatory Visit: Payer: Self-pay | Admitting: Internal Medicine

## 2013-12-13 ENCOUNTER — Encounter: Payer: Self-pay | Admitting: *Deleted

## 2013-12-15 ENCOUNTER — Ambulatory Visit (INDEPENDENT_AMBULATORY_CARE_PROVIDER_SITE_OTHER): Payer: Private Health Insurance - Indemnity | Admitting: *Deleted

## 2013-12-15 DIAGNOSIS — Z7901 Long term (current) use of anticoagulants: Secondary | ICD-10-CM

## 2013-12-15 DIAGNOSIS — Z5181 Encounter for therapeutic drug level monitoring: Secondary | ICD-10-CM

## 2013-12-15 DIAGNOSIS — I2699 Other pulmonary embolism without acute cor pulmonale: Secondary | ICD-10-CM

## 2013-12-15 DIAGNOSIS — I4891 Unspecified atrial fibrillation: Secondary | ICD-10-CM

## 2013-12-15 LAB — POCT INR: INR: 2.5

## 2013-12-20 ENCOUNTER — Other Ambulatory Visit: Payer: Medicare Other | Admitting: Lab

## 2013-12-20 ENCOUNTER — Ambulatory Visit: Payer: Medicare Other

## 2014-01-05 ENCOUNTER — Other Ambulatory Visit (HOSPITAL_COMMUNITY): Payer: Self-pay | Admitting: Cardiology

## 2014-01-05 ENCOUNTER — Other Ambulatory Visit: Payer: Self-pay

## 2014-01-05 DIAGNOSIS — I6529 Occlusion and stenosis of unspecified carotid artery: Secondary | ICD-10-CM

## 2014-01-05 DIAGNOSIS — I779 Disorder of arteries and arterioles, unspecified: Secondary | ICD-10-CM

## 2014-01-07 ENCOUNTER — Other Ambulatory Visit: Payer: Self-pay | Admitting: Cardiology

## 2014-01-07 ENCOUNTER — Ambulatory Visit (INDEPENDENT_AMBULATORY_CARE_PROVIDER_SITE_OTHER): Payer: Private Health Insurance - Indemnity | Admitting: *Deleted

## 2014-01-07 ENCOUNTER — Ambulatory Visit (HOSPITAL_COMMUNITY): Payer: Medicare HMO | Attending: Cardiology | Admitting: Cardiology

## 2014-01-07 DIAGNOSIS — Z5181 Encounter for therapeutic drug level monitoring: Secondary | ICD-10-CM

## 2014-01-07 DIAGNOSIS — Z7901 Long term (current) use of anticoagulants: Secondary | ICD-10-CM

## 2014-01-07 DIAGNOSIS — I999 Unspecified disorder of circulatory system: Secondary | ICD-10-CM | POA: Insufficient documentation

## 2014-01-07 DIAGNOSIS — I779 Disorder of arteries and arterioles, unspecified: Secondary | ICD-10-CM

## 2014-01-07 DIAGNOSIS — I4891 Unspecified atrial fibrillation: Secondary | ICD-10-CM

## 2014-01-07 DIAGNOSIS — I6529 Occlusion and stenosis of unspecified carotid artery: Secondary | ICD-10-CM

## 2014-01-07 DIAGNOSIS — I2699 Other pulmonary embolism without acute cor pulmonale: Secondary | ICD-10-CM

## 2014-01-07 LAB — POCT INR: INR: 2.6

## 2014-01-07 NOTE — Progress Notes (Signed)
Carotid duplex completed 

## 2014-01-16 ENCOUNTER — Encounter: Payer: Self-pay | Admitting: Cardiology

## 2014-01-29 ENCOUNTER — Other Ambulatory Visit: Payer: Self-pay | Admitting: Internal Medicine

## 2014-02-07 ENCOUNTER — Ambulatory Visit (INDEPENDENT_AMBULATORY_CARE_PROVIDER_SITE_OTHER): Payer: Private Health Insurance - Indemnity | Admitting: *Deleted

## 2014-02-07 ENCOUNTER — Ambulatory Visit (INDEPENDENT_AMBULATORY_CARE_PROVIDER_SITE_OTHER): Payer: Private Health Insurance - Indemnity | Admitting: Cardiology

## 2014-02-07 ENCOUNTER — Encounter: Payer: Self-pay | Admitting: Cardiology

## 2014-02-07 VITALS — BP 122/82 | HR 78 | Ht 72.0 in | Wt 225.0 lb

## 2014-02-07 DIAGNOSIS — I5032 Chronic diastolic (congestive) heart failure: Secondary | ICD-10-CM

## 2014-02-07 DIAGNOSIS — E876 Hypokalemia: Secondary | ICD-10-CM

## 2014-02-07 DIAGNOSIS — Z7901 Long term (current) use of anticoagulants: Secondary | ICD-10-CM

## 2014-02-07 DIAGNOSIS — I82409 Acute embolism and thrombosis of unspecified deep veins of unspecified lower extremity: Secondary | ICD-10-CM

## 2014-02-07 DIAGNOSIS — I4891 Unspecified atrial fibrillation: Secondary | ICD-10-CM

## 2014-02-07 DIAGNOSIS — I10 Essential (primary) hypertension: Secondary | ICD-10-CM

## 2014-02-07 DIAGNOSIS — I779 Disorder of arteries and arterioles, unspecified: Secondary | ICD-10-CM

## 2014-02-07 DIAGNOSIS — Z5181 Encounter for therapeutic drug level monitoring: Secondary | ICD-10-CM

## 2014-02-07 DIAGNOSIS — I739 Peripheral vascular disease, unspecified: Secondary | ICD-10-CM

## 2014-02-07 DIAGNOSIS — I2699 Other pulmonary embolism without acute cor pulmonale: Secondary | ICD-10-CM

## 2014-02-07 DIAGNOSIS — I495 Sick sinus syndrome: Secondary | ICD-10-CM

## 2014-02-07 DIAGNOSIS — Z950 Presence of cardiac pacemaker: Secondary | ICD-10-CM

## 2014-02-07 LAB — BASIC METABOLIC PANEL
BUN: 20 mg/dL (ref 6–23)
CO2: 29 meq/L (ref 19–32)
CREATININE: 1.2 mg/dL (ref 0.4–1.2)
Calcium: 9.3 mg/dL (ref 8.4–10.5)
Chloride: 102 mEq/L (ref 96–112)
GFR: 45.36 mL/min — ABNORMAL LOW (ref >60.00)
Glucose, Bld: 87 mg/dL (ref 70–99)
POTASSIUM: 3.9 meq/L (ref 3.5–5.1)
SODIUM: 139 meq/L (ref 135–145)

## 2014-02-07 LAB — POCT INR: INR: 2.6

## 2014-02-07 NOTE — Progress Notes (Signed)
Patient ID: [name] [name], female   DOB: 22-Aug-1931, 80 y.o.   MRN: [id number]    HPI  Patient is seen today to followup supraventricular tachycardia and history of atrial fib and flutter. She is on flecainide and holding sinus rhythm. She is a pacemaker in place. She is atrially pacing. She has not had any significant symptoms with palpitations. She's not having any significant chest pain.  No Known Allergies  Current Outpatient Prescriptions  Medication Sig Dispense Refill  . anastrozole (ARIMIDEX) 1 MG tablet TAKE 1 TABLET BY MOUTH EVERY DAY  90 tablet  1  . bethanechol (URECHOLINE) 25 MG tablet Take 25 mg by mouth daily.      Marland Kitchen dicyclomine (BENTYL) 10 MG capsule Take 10 mg by mouth 2 (two) times daily.      Marland Kitchen dicyclomine (BENTYL) 10 MG capsule TAKE ONE CAPSULE BY MOUTH TWICE A DAY WITH A MEAL  60 capsule  2  . flecainide (TAMBOCOR) 100 MG tablet TAKE 1 TABLET (100 MG TOTAL) BY MOUTH 2 (TWO) TIMES DAILY.  60 tablet  3  . furosemide (LASIX) 40 MG tablet TAKE 1 TABLET (40 MG TOTAL) BY MOUTH 2 (TWO) TIMES DAILY.  60 tablet  1  . metFORMIN (GLUCOPHAGE) 500 MG tablet Take 750 mg by mouth 2 (two) times daily with a meal.      . metoprolol succinate (TOPROL-XL) 25 MG 24 hr tablet TAKE 2 TABLETS (50 MG TOTAL) BY MOUTH DAILY.  60 tablet  4  . polyethylene glycol (MIRALAX) packet Take 17 g by mouth daily.  14 each  0  . potassium chloride 20 MEQ/15ML (10%) solution Take 20 mEq by mouth 2 (two) times daily.      . promethazine (PHENERGAN) 25 MG tablet Take 1 tablet (25 mg total) by mouth every 8 (eight) hours as needed for nausea or vomiting.  15 tablet  0  . pyridoxine (B-6) 100 MG tablet Take 200 mg by mouth daily.       . vitamin B-12 (CYANOCOBALAMIN) 500 MCG tablet Take 500 mcg by mouth daily.       Marland Kitchen warfarin (COUMADIN) 5 MG tablet TAKE AS DIRECTED BY COUMADIN CLINIC  30 tablet  3   No current facility-administered medications for this visit.    History   Social History  . Marital  Status: Widowed    Spouse Name: N/A    Number of Children: 3  . Years of Education: N/A   Occupational History  . Retired     Social History Main Topics  . Smoking status: Former Smoker -- 4.00 packs/day    Quit date: 10/26/1998  . Smokeless tobacco: Never Used  . Alcohol Use: No  . Drug Use: No  . Sexual Activity: Not on file   Other Topics Concern  . Not on file   Social History Narrative   Widowed.  Lives in [place] by herself.  Avoids caffeine.  Very active, taking care of her brother who also lives by himself and is in his late 47's.    Family History  Problem Relation Age of Onset  . Heart disease Mother   . COPD Father   . Colon cancer Neg Hx     Past Medical History  Diagnosis Date  . Hypertension   . Diabetes mellitus   . Tachy-brady syndrome     s/p pacer 07/2009  . Atrial fibrillation     flecainide; coumadin  . Atrial flutter     November, 2010, flecainide, Coumadin  .  Chronic diastolic heart failure     echo 10/10: mild LVH, EF 55-60%, mild AI, mild MR, mild to mod LAE, mild RVE, severe RAE, mod TR, PASP 53;  Echo 3/14:  Mild LVH, EF 55%, Tr AI, MAC, mild MR, mod LAE, PASP 31, trivial eff.  . Shortness of breath     myoview 11/10: EF 65%, no ischemia  . Pulmonary HTN     53 mmHg echo, 2010, moderate TR, mild right ventricular enlargement  . DVT (deep venous thrombosis)     Pulmonary embolus  . Pulmonary embolus 2006    2006, with DVT  . Carotid artery disease     [id number]: RICA [id number], LICA [id number]  . Thyroid nodule     non-neoplastic goiter  . Breast cancer   . Ejection fraction     EF 60%, echo, October, 2010  . Warfarin anticoagulation     Atrial fibrillation  . Pacemaker     November, 2010, Dr Rayann Heman - MDT ADDRL1 Adapta DC PPM ser #: [id number]    . S/P laminectomy     June 22, 2011  . Syncope     2010, treated with pacemaker  . Drug therapy     FLECAINIDE  . Mitral regurgitation     mild, echo, October, 2010  . Mediastinal  lymphadenopathy   . Venous insufficiency     Chronic  . Hypokalemia     October, 2012, potassium dose increased  . Hiatal hernia 2006    EGD  . Acute gastric ulcer without mention of hemorrhage, perforation, or obstruction 2006    EGD  . Esophagitis, unspecified 2006    EGD  . Allergy     seasonal  . Diverticulosis   . Hx of adenomatous colonic polyps   . Fecal incontinence   . Breast cancer   . Osteopenia     Past Surgical History  Procedure Laterality Date  . Breast surgery      Left  . Abdominal hysterectomy    . Back surgery      1973, 2012   . Ankle surgery      Patient Active Problem List   Diagnosis Date Noted  . Encounter for therapeutic drug monitoring 11/04/2013  . Right leg swelling 07/19/2013  . Hypertension 06/21/2013  . Osteopenia   . Hypokalemia   . Tachy-brady syndrome   . Atrial fibrillation   . Atrial flutter   . Chronic diastolic heart failure   . Shortness of breath   . Pulmonary HTN   . DVT (deep venous thrombosis)   . Pulmonary embolus   . Carotid artery disease   . Breast cancer   . Ejection fraction   . Warfarin anticoagulation   . Pacemaker-Medtronic   . S/P laminectomy   . Syncope   . Drug therapy   . Mitral regurgitation   . Mediastinal lymphadenopathy   . Venous insufficiency   . Ulcer of toe 04/26/2011  . THYROID NODULE, HX OF 07/19/2009  . DM 06/13/2008  . ALLERGIC RHINITIS 06/13/2008  . MEDIASTINAL LYMPHADENOPATHY 06/13/2008    ROS   Patient denies fever, chills, headache, sweats, rash, change in vision, change in hearing, chest pain, cough, nausea vomiting, urinary symptoms. All other systems are reviewed and are negative.  PHYSICAL EXAM  Patient is oriented to person time and place. Affect is normal. She is overweight. Head is atraumatic. Sclera and conjunctiva are normal. There is no jugulovenous distention. Lungs are clear. Respiratory effort is nonlabored. Cardiac exam  reveals S1 and S2. There no clicks or  significant murmurs. Abdomen is soft. There is no peripheral edema. She walks with a cane. There no musculoskeletal deformities. There are no skin rashes.  Filed Vitals:   02/07/14 0745  BP: 122/82  Pulse: 78  Height: 6' (1.829 m)  Weight: 225 lb (102.059 kg)    EKG is done today and reviewed by me. There is atrial pacing. There is a narrow QRS that is normal. ASSESSMENT & PLAN

## 2014-02-07 NOTE — Assessment & Plan Note (Signed)
I review when her last potassium was checked. We will draw labs if needed.

## 2014-02-07 NOTE — Assessment & Plan Note (Signed)
She has significant carotid disease. However it is stable. Her last Doppler was April, 2015. Her left internal carotid artery stenosis is the most significant at 60-79%. There is plan for a six-month followup.

## 2014-02-07 NOTE — Assessment & Plan Note (Signed)
This history of DVT and a pulmonary and was in the past. She is on Coumadin for this also.

## 2014-02-07 NOTE — Assessment & Plan Note (Signed)
Blood pressure is controlled. No change in therapy. 

## 2014-02-07 NOTE — Assessment & Plan Note (Signed)
Her volum is nicely controlled. She has mild valvular disease. moderate pulmonary hypertension. No change in therapy.

## 2014-02-07 NOTE — Assessment & Plan Note (Signed)
She's had tachybradycardia in the past and has a pacemaker. She's doing quite well. She's on flecainide. She is holding sinus rhythm.

## 2014-02-07 NOTE — Assessment & Plan Note (Signed)
With history of A. fib and flutter she is on Coumadin. She appears to be holding sinus however and she is on flecainide.

## 2014-02-07 NOTE — Assessment & Plan Note (Signed)
Pacemaker stable and being followed by our team.

## 2014-02-07 NOTE — Patient Instructions (Signed)
**Note De-identified  Obfuscation** Your physician recommends that you continue on your current medications as directed. Please refer to the Current Medication list given to you today.  Your physician recommends that you return for lab work in: today  Your physician wants you to follow-up in: 6 months. You will receive a reminder letter in the mail two months in advance. If you don't receive a letter, please call our office to schedule the follow-up appointment.   

## 2014-02-14 ENCOUNTER — Ambulatory Visit (HOSPITAL_BASED_OUTPATIENT_CLINIC_OR_DEPARTMENT_OTHER): Payer: Private Health Insurance - Indemnity | Admitting: Hematology and Oncology

## 2014-02-14 ENCOUNTER — Telehealth: Payer: Self-pay | Admitting: Hematology and Oncology

## 2014-02-14 ENCOUNTER — Other Ambulatory Visit: Payer: Medicare Other | Admitting: Lab

## 2014-02-14 VITALS — BP 138/74 | HR 90 | Temp 97.9°F | Resp 20 | Ht 72.0 in | Wt 225.7 lb

## 2014-02-14 DIAGNOSIS — C773 Secondary and unspecified malignant neoplasm of axilla and upper limb lymph nodes: Secondary | ICD-10-CM

## 2014-02-14 DIAGNOSIS — C50919 Malignant neoplasm of unspecified site of unspecified female breast: Secondary | ICD-10-CM

## 2014-02-14 DIAGNOSIS — M8580 Other specified disorders of bone density and structure, unspecified site: Secondary | ICD-10-CM

## 2014-02-14 DIAGNOSIS — C50419 Malignant neoplasm of upper-outer quadrant of unspecified female breast: Secondary | ICD-10-CM

## 2014-02-14 DIAGNOSIS — M810 Age-related osteoporosis without current pathological fracture: Secondary | ICD-10-CM

## 2014-02-14 NOTE — Telephone Encounter (Signed)
Gave pt appt for MD for November 2015

## 2014-02-14 NOTE — Progress Notes (Signed)
[facility] OFFICE PROGRESS NOTE  Patient Care Team: Foye Spurling, MD as PCP - General (Endocrinology) Lafayette Dragon, MD (Gastroenterology) Heath Lark, MD as Consulting Physician (Hematology and Oncology)  DIAGNOSIS: Left breast cancer, no evidence of disease  SUMMARY OF ONCOLOGIC HISTORY: Oncology History   Breast cancer   Primary site: Breast (Left)   Clinical free text: IIB   Clinical: Stage IIB (T2, N1, cM0) signed by Heath Lark, MD on 08/17/2013  9:28 AM   Pathologic: Stage IIB (T2, N1, cM0) signed by Heath Lark, MD on 08/17/2013  9:28 AM   Summary: Stage IIB (T2, N1, cM0)      Breast cancer    Initial Diagnosis Breast cancer   01/16/2010 Procedure Needle biopsy confirmed invasive ductal carcinoma, 100% ER+,62% PR+, Her2 by C[facility] 1.74   02/07/2010 Surgery She had left mastectomy and SLN biopsy, 3.5 x 2 x 2 cm mass, 1 positive LN, Nottingham Grade II,  final staging IIB    INTERVAL HISTORY: [name] [name] 80 y.o. female returns for further followup. She is doing very well. She tolerated Arimidex without mood swings or hot flashes. She denies any recent abnormal breast examination, palpable mass, abnormal breast appearance or nipple changes  I have reviewed the past medical history, past surgical history, social history and family history with the patient and they are unchanged from previous note.  ALLERGIES:  has No Known Allergies.  MEDICATIONS:  Current Outpatient Prescriptions  Medication Sig Dispense Refill  . anastrozole (ARIMIDEX) 1 MG tablet TAKE 1 TABLET BY MOUTH EVERY DAY  90 tablet  1  . bethanechol (URECHOLINE) 25 MG tablet Take 25 mg by mouth daily.      Marland Kitchen dicyclomine (BENTYL) 10 MG capsule Take 10 mg by mouth 2 (two) times daily.      Marland Kitchen dicyclomine (BENTYL) 10 MG capsule TAKE ONE CAPSULE BY MOUTH TWICE A DAY WITH A MEAL  60 capsule  2  . flecainide (TAMBOCOR) 100 MG tablet TAKE 1 TABLET (100 MG TOTAL) BY MOUTH 2 (TWO) TIMES DAILY.  60 tablet   3  . furosemide (LASIX) 40 MG tablet TAKE 1 TABLET (40 MG TOTAL) BY MOUTH 2 (TWO) TIMES DAILY.  60 tablet  1  . metFORMIN (GLUCOPHAGE) 500 MG tablet Take 750 mg by mouth 2 (two) times daily with a meal.      . metoprolol succinate (TOPROL-XL) 25 MG 24 hr tablet TAKE 2 TABLETS (50 MG TOTAL) BY MOUTH DAILY.  60 tablet  4  . polyethylene glycol (MIRALAX) packet Take 17 g by mouth daily.  14 each  0  . potassium chloride 20 MEQ/15ML (10%) solution Take 20 mEq by mouth 2 (two) times daily.      . promethazine (PHENERGAN) 25 MG tablet Take 1 tablet (25 mg total) by mouth every 8 (eight) hours as needed for nausea or vomiting.  15 tablet  0  . pyridoxine (B-6) 100 MG tablet Take 200 mg by mouth daily.       . vitamin B-12 (CYANOCOBALAMIN) 500 MCG tablet Take 500 mcg by mouth daily.       Marland Kitchen warfarin (COUMADIN) 5 MG tablet TAKE AS DIRECTED BY COUMADIN CLINIC  30 tablet  3   No current facility-administered medications for this visit.    REVIEW OF SYSTEMS:   Constitutional: Denies fevers, chills or abnormal weight loss Eyes: Denies blurriness of vision Ears, nose, mouth, throat, and face: Denies mucositis or sore throat Respiratory: Denies cough, dyspnea or  wheezes Cardiovascular: Denies palpitation, chest discomfort or lower extremity swelling Gastrointestinal:  Denies nausea, heartburn or change in bowel habits Skin: Denies abnormal skin rashes Lymphatics: Denies new lymphadenopathy or easy bruising Neurological:Denies numbness, tingling or new weaknesses Behavioral/Psych: Mood is stable, no new changes  All other systems were reviewed with the patient and are negative.  PHYSICAL EXAMINATION: ECOG PERFORMANCE STATUS: 0 - Asymptomatic  Filed Vitals:   02/14/14 0938  BP: 138/74  Pulse: 90  Temp: 97.9 F (36.6 C)  Resp: 20   Filed Weights   02/14/14 0938  Weight: 225 lb 11.2 oz (102.377 kg)    GENERAL:alert, no distress and comfortable. She is moderately obese SKIN: skin color,  texture, turgor are normal, no rashes or significant lesions EYES: normal, Conjunctiva are pink and non-injected, sclera clear OROPHARYNX:no exudate, no erythema and lips, buccal mucosa, and tongue normal  NECK: supple, thyroid normal size, non-tender, without nodularity LYMPH:  no palpable lymphadenopathy in the cervical, axillary or inguinal LUNGS: clear to auscultation and percussion with normal breathing effort HEART: regular rate & rhythm and no murmurs and no lower extremity edema ABDOMEN:abdomen soft, non-tender and normal bowel sounds Musculoskeletal:no cyanosis of digits and no clubbing  NEURO: alert & oriented x 3 with fluent speech, no focal motor/sensory deficits Left chest wall examination revealed well-healed surgical scar with no abnormalities. Normal breast exam on the right. LABORATORY DATA:  I have reviewed the data as listed    Component Value Date/Time   NA 139 02/07/2014 0820   NA 139 03/22/2013 1015   NA 140 10/14/2011 0937   K 3.9 02/07/2014 0820   K 3.7 03/22/2013 1015   K 4.3 10/14/2011 0937   CL 102 02/07/2014 0820   CL 101 03/22/2013 1015   CL 100 10/14/2011 0937   CO2 29 02/07/2014 0820   CO2 26 03/22/2013 1015   CO2 29 10/14/2011 0937   GLUCOSE 87 02/07/2014 0820   GLUCOSE 197* 03/22/2013 1015   GLUCOSE 268* 10/14/2011 0937   BUN 20 02/07/2014 0820   BUN 23.2 03/22/2013 1015   BUN 14 10/14/2011 0937   CREATININE 1.2 02/07/2014 0820   CREATININE 1.3* 03/22/2013 1015   CREATININE 1.2 10/14/2011 0937   CALCIUM 9.3 02/07/2014 0820   CALCIUM 9.8 03/22/2013 1015   CALCIUM 9.2 10/14/2011 0937   PROT 8.0 09/19/2013 1425   PROT 7.6 03/22/2013 1015   PROT 7.2 10/14/2011 0937   ALBUMIN 3.9 09/19/2013 1425   ALBUMIN 3.5 03/22/2013 1015   AST 19 09/19/2013 1425   AST 16 03/22/2013 1015   AST 15 10/14/2011 0937   ALT 15 09/19/2013 1425   ALT 12 03/22/2013 1015   ALT 17 10/14/2011 0937   ALKPHOS 119* 09/19/2013 1425   ALKPHOS 102 03/22/2013 1015   ALKPHOS 109* 10/14/2011 0937    BILITOT 0.5 09/19/2013 1425   BILITOT 0.60 03/22/2013 1015   BILITOT 0.50 10/14/2011 0937   GFRNONAA 32* 09/19/2013 1425   GFRAA 37* 09/19/2013 1425    No results found for this basename: SPEP,  UPEP,   kappa and lambda light chains    Lab Results  Component Value Date   WBC 9.1 09/19/2013   NEUTROABS 8.2* 09/19/2013   HGB 13.6 09/19/2013   HCT 40.0 09/19/2013   MCV 88.9 09/19/2013   PLT 285 09/19/2013      Chemistry      Component Value Date/Time   NA 139 02/07/2014 0820   NA 139 03/22/2013 1015   NA 140  10/14/2011 0937   K 3.9 02/07/2014 0820   K 3.7 03/22/2013 1015   K 4.3 10/14/2011 0937   CL 102 02/07/2014 0820   CL 101 03/22/2013 1015   CL 100 10/14/2011 0937   CO2 29 02/07/2014 0820   CO2 26 03/22/2013 1015   CO2 29 10/14/2011 0937   BUN 20 02/07/2014 0820   BUN 23.2 03/22/2013 1015   BUN 14 10/14/2011 0937   CREATININE 1.2 02/07/2014 0820   CREATININE 1.3* 03/22/2013 1015   CREATININE 1.2 10/14/2011 0937      Component Value Date/Time   CALCIUM 9.3 02/07/2014 0820   CALCIUM 9.8 03/22/2013 1015   CALCIUM 9.2 10/14/2011 0937   ALKPHOS 119* 09/19/2013 1425   ALKPHOS 102 03/22/2013 1015   ALKPHOS 109* 10/14/2011 0937   AST 19 09/19/2013 1425   AST 16 03/22/2013 1015   AST 15 10/14/2011 0937   ALT 15 09/19/2013 1425   ALT 12 03/22/2013 1015   ALT 17 10/14/2011 0937   BILITOT 0.5 09/19/2013 1425   BILITOT 0.60 03/22/2013 1015   BILITOT 0.50 10/14/2011 0768     ASSESSMENT & PLAN:  #1 left breast cancer The patient never received adjuvant chemotherapy or radiation therapy. She is comfortable with adjuvant endocrine therapy. She had no side effects from anastrozole. She is due for mammogram. I recommend she call the imaging study to have it done. I plan to see her back in 6 months with history and physical examination. Plan would be to do 5 years of treatment with anastrozole. #2 osteoporosis She's not taking enough calcium or vitamin D supplements. I recommend her to increase the  supplement to 2 a day. If her bone density get worse in the future, she might need bisphosphonate therapy   All questions were answered. The patient knows to call the clinic with any problems, questions or concerns. No barriers to learning was detected. I spent 15 minutes counseling the patient face to face. The total time spent in the appointment was 20 minutes and more than 50% was on counseling and review of test results     Heath Lark, MD 02/14/2014 11:08 AM

## 2014-03-09 ENCOUNTER — Ambulatory Visit (INDEPENDENT_AMBULATORY_CARE_PROVIDER_SITE_OTHER): Payer: Private Health Insurance - Indemnity | Admitting: Internal Medicine

## 2014-03-09 ENCOUNTER — Ambulatory Visit (INDEPENDENT_AMBULATORY_CARE_PROVIDER_SITE_OTHER): Payer: Private Health Insurance - Indemnity | Admitting: *Deleted

## 2014-03-09 ENCOUNTER — Encounter: Payer: Self-pay | Admitting: Internal Medicine

## 2014-03-09 VITALS — BP 140/72 | HR 70 | Ht 72.0 in | Wt 223.2 lb

## 2014-03-09 DIAGNOSIS — I495 Sick sinus syndrome: Secondary | ICD-10-CM

## 2014-03-09 DIAGNOSIS — I4891 Unspecified atrial fibrillation: Secondary | ICD-10-CM

## 2014-03-09 DIAGNOSIS — Z7901 Long term (current) use of anticoagulants: Secondary | ICD-10-CM

## 2014-03-09 DIAGNOSIS — I2699 Other pulmonary embolism without acute cor pulmonale: Secondary | ICD-10-CM

## 2014-03-09 DIAGNOSIS — Z5181 Encounter for therapeutic drug level monitoring: Secondary | ICD-10-CM

## 2014-03-09 LAB — MDC_IDC_ENUM_SESS_TYPE_INCLINIC
Battery Impedance: 304 Ohm
Battery Remaining Longevity: 110 mo
Brady Statistic AP VP Percent: 0 %
Date Time Interrogation Session: 20150610095150
Lead Channel Impedance Value: 445 Ohm
Lead Channel Pacing Threshold Amplitude: 1 V
Lead Channel Pacing Threshold Pulse Width: 0.4 ms
Lead Channel Sensing Intrinsic Amplitude: 8 mV
Lead Channel Setting Pacing Amplitude: 2 V
Lead Channel Setting Pacing Amplitude: 2.5 V
Lead Channel Setting Sensing Sensitivity: 2.8 mV
MDC IDC MSMT BATTERY VOLTAGE: 2.79 V
MDC IDC MSMT LEADCHNL RA PACING THRESHOLD AMPLITUDE: 0.5 V
MDC IDC MSMT LEADCHNL RV IMPEDANCE VALUE: 487 Ohm
MDC IDC MSMT LEADCHNL RV PACING THRESHOLD PULSEWIDTH: 0.4 ms
MDC IDC SET LEADCHNL RV PACING PULSEWIDTH: 0.4 ms
MDC IDC STAT BRADY AP VS PERCENT: 99 %
MDC IDC STAT BRADY AS VP PERCENT: 0 %
MDC IDC STAT BRADY AS VS PERCENT: 1 %

## 2014-03-09 LAB — POCT INR: INR: 2.2

## 2014-03-09 NOTE — Patient Instructions (Signed)
Your physician wants you to follow-up in: 12 months with Dr Vallery Ridge will receive a reminder letter in the mail two months in advance. If you don't receive a letter, please call our office to schedule the follow-up appointment.  Remote monitoring is used to monitor your Pacemaker or ICD from home. This monitoring reduces the number of office visits required to check your device to one time per year. It allows [place] to keep an eye on the functioning of your device to ensure it is working properly. You are scheduled for a device check from home on 06/13/14. You may send your transmission at any time that day. If you have a wireless device, the transmission will be sent automatically. After your physician reviews your transmission, you will receive a postcard with your next transmission date.   Please send a transmission as soon as you receive equipment and then again in 1 month

## 2014-03-09 NOTE — Progress Notes (Signed)
PCP:  Foye Spurling, MD Primary Cardiologist:  Dr Ron Parker  The patient presents today for routine electrophysiology followup.  Since last being seen in our clinic, the patient reports doing reasonably well.  She remains very active despite her age.  Today, she denies symptoms of palpitations, chest pain, shortness of breath, orthopnea, PND, lower extremity edema, dizziness, presyncope, syncope, or neurologic sequela.  The patient feels that she is tolerating medications without difficulties and is otherwise without complaint today.   Past Medical History  Diagnosis Date  . Hypertension   . Diabetes mellitus   . Tachy-brady syndrome     s/p pacer 07/2009  . Atrial fibrillation     flecainide; coumadin  . Atrial flutter     November, 2010, flecainide, Coumadin  . Chronic diastolic heart failure     echo 10/10: mild LVH, EF 55-60%, mild AI, mild MR, mild to mod LAE, mild RVE, severe RAE, mod TR, PASP 53;  Echo 3/14:  Mild LVH, EF 55%, Tr AI, MAC, mild MR, mod LAE, PASP 31, trivial eff.  . Shortness of breath     myoview 11/10: EF 65%, no ischemia  . Pulmonary HTN     53 mmHg echo, 2010, moderate TR, mild right ventricular enlargement  . DVT (deep venous thrombosis)     Pulmonary embolus  . Pulmonary embolus 2006    2006, with DVT  . Carotid artery disease     [id number]: RICA [id number], LICA [id number]  . Thyroid nodule     non-neoplastic goiter  . Breast cancer   . Ejection fraction     EF 60%, echo, October, 2010  . Warfarin anticoagulation     Atrial fibrillation  . Pacemaker     November, 2010, Dr Rayann Heman - MDT ADDRL1 Adapta DC PPM ser #: [id number]    . S/P laminectomy     June 22, 2011  . Syncope     2010, treated with pacemaker  . Drug therapy     FLECAINIDE  . Mitral regurgitation     mild, echo, October, 2010  . Mediastinal lymphadenopathy   . Venous insufficiency     Chronic  . Hypokalemia     October, 2012, potassium dose increased  . Hiatal hernia 2006    EGD  .  Acute gastric ulcer without mention of hemorrhage, perforation, or obstruction 2006    EGD  . Esophagitis, unspecified 2006    EGD  . Allergy     seasonal  . Diverticulosis   . Hx of adenomatous colonic polyps   . Fecal incontinence   . Breast cancer   . Osteopenia    Past Surgical History  Procedure Laterality Date  . Breast surgery      Left  . Abdominal hysterectomy    . Back surgery      1973, 2012   . Ankle surgery      Current Outpatient Prescriptions  Medication Sig Dispense Refill  . anastrozole (ARIMIDEX) 1 MG tablet TAKE 1 TABLET BY MOUTH EVERY DAY  90 tablet  1  . bethanechol (URECHOLINE) 25 MG tablet Take 25 mg by mouth daily.      Marland Kitchen dicyclomine (BENTYL) 10 MG capsule Take 10 mg by mouth 2 (two) times daily.      . flecainide (TAMBOCOR) 100 MG tablet TAKE 1 TABLET (100 MG TOTAL) BY MOUTH 2 (TWO) TIMES DAILY.  60 tablet  3  . furosemide (LASIX) 40 MG tablet TAKE 1 TABLET (40 MG TOTAL) BY MOUTH  2 (TWO) TIMES DAILY.  60 tablet  1  . metFORMIN (GLUCOPHAGE) 500 MG tablet Take 750 mg by mouth 2 (two) times daily with a meal.      . metoprolol succinate (TOPROL-XL) 25 MG 24 hr tablet TAKE 2 TABLETS (50 MG TOTAL) BY MOUTH DAILY.  60 tablet  4  . polyethylene glycol (MIRALAX) packet Take 17 g by mouth daily.  14 each  0  . potassium chloride 20 MEQ/15ML (10%) solution Take 20 mEq by mouth 2 (two) times daily.      . promethazine (PHENERGAN) 25 MG tablet Take 1 tablet (25 mg total) by mouth every 8 (eight) hours as needed for nausea or vomiting.  15 tablet  0  . pyridoxine (B-6) 100 MG tablet Take 200 mg by mouth daily.       . vitamin B-12 (CYANOCOBALAMIN) 500 MCG tablet Take 500 mcg by mouth daily.       Marland Kitchen warfarin (COUMADIN) 5 MG tablet TAKE AS DIRECTED BY COUMADIN CLINIC  30 tablet  3   No current facility-administered medications for this visit.    No Known Allergies  History   Social History  . Marital Status: Widowed    Spouse Name: N/A    Number of Children:  3  . Years of Education: N/A   Occupational History  . Retired     Social History Main Topics  . Smoking status: Former Smoker -- 4.00 packs/day    Quit date: 10/26/1998  . Smokeless tobacco: Never Used  . Alcohol Use: No  . Drug Use: No  . Sexual Activity: Not on file   Other Topics Concern  . Not on file   Social History Narrative   Widowed.  Lives in [place] by herself.  Avoids caffeine.  Very active, taking care of her brother who also lives by himself and is in his late 69's.    Family History  Problem Relation Age of Onset  . Heart disease Mother   . COPD Father   . Colon cancer Neg Hx     ROS-  All systems are reviewed and are negative except as outlined in the HPI above  Physical Exam: Filed Vitals:   03/09/14 0912  BP: 140/72  Pulse: 70  Height: 6' (1.829 m)  Weight: 223 lb 3.2 oz (101.243 kg)    GEN- The patient is well appearing, alert and oriented x 3 today.   Head- normocephalic, atraumatic Eyes-  Sclera clear, conjunctiva pink Ears- hearing intact Oropharynx- clear Neck- supple, no JVP Lymph- no cervical lymphadenopathy Lungs- Clear to ausculation bilaterally, normal work of breathing Chest- pacemaker pocket is well healed Heart- Regular rate and rhythm, no murmurs, rubs or gallops, PMI not laterally displaced GI- soft, NT, ND, + BS Extremities- no clubbing, cyanosis, or edema   Pacemaker interrogation- reviewed in detail today,  See PACEART report  Assessment and Plan:  1. Sick sinus syndrome Normal pacemaker function See Pace Art report No changes today Will enroll in My [employer] project  2. Atrial fibrillation Well controlled Continue long term anticoagulation Today, I discussed coumadin and novel anticoagulants including pradaxa, xarelto, and eliquis today as indicated for risk reduction in stroke and systemic emboli with nonvalvular atrial fibrillation.  Risks, benefits, and alternatives to each of these drugs were discussed at  length today.  She remains clear that she wishes to continue coumadin at this time.  3. HTN Stable No change required today  carelink Return to see me in 1  year Follow-up with Dr Ron Parker as scheduled

## 2014-03-17 ENCOUNTER — Encounter: Payer: Self-pay | Admitting: Internal Medicine

## 2014-03-29 ENCOUNTER — Other Ambulatory Visit: Payer: Self-pay | Admitting: Cardiology

## 2014-04-08 ENCOUNTER — Telehealth: Payer: Self-pay | Admitting: Cardiology

## 2014-04-08 ENCOUNTER — Telehealth: Payer: Self-pay | Admitting: *Deleted

## 2014-04-08 NOTE — Telephone Encounter (Signed)
Pt states she has been taking care of her 78 y.o. brother and has not had time to download app for new Carelink Smart reader. Pt states she prefers coming to office instead of home remotes.  I attempted to explain the time that would be saved by doing a home remote transmission but pt insisted on in-person OVs. Recall made for device clinic for Dec/2015. Pt will bring her [place] equipment then for further discussion.

## 2014-04-08 NOTE — Telephone Encounter (Signed)
Called and LMOVM requesting pt send manual transmission from carelink smart due to upgrades.

## 2014-04-13 ENCOUNTER — Other Ambulatory Visit: Payer: Self-pay | Admitting: *Deleted

## 2014-04-13 MED ORDER — FLECAINIDE ACETATE 100 MG PO TABS: ORAL_TABLET | ORAL | Status: DC

## 2014-04-15 ENCOUNTER — Encounter: Payer: Self-pay | Admitting: Cardiology

## 2014-04-18 ENCOUNTER — Other Ambulatory Visit: Payer: Self-pay | Admitting: Internal Medicine

## 2014-04-20 ENCOUNTER — Ambulatory Visit (INDEPENDENT_AMBULATORY_CARE_PROVIDER_SITE_OTHER): Payer: Medicare HMO | Admitting: *Deleted

## 2014-04-20 DIAGNOSIS — Z5181 Encounter for therapeutic drug level monitoring: Secondary | ICD-10-CM

## 2014-04-20 DIAGNOSIS — I2699 Other pulmonary embolism without acute cor pulmonale: Secondary | ICD-10-CM

## 2014-04-20 DIAGNOSIS — I4891 Unspecified atrial fibrillation: Secondary | ICD-10-CM

## 2014-04-20 DIAGNOSIS — Z7901 Long term (current) use of anticoagulants: Secondary | ICD-10-CM

## 2014-04-20 LAB — POCT INR: INR: 2.3

## 2014-06-01 ENCOUNTER — Ambulatory Visit (INDEPENDENT_AMBULATORY_CARE_PROVIDER_SITE_OTHER): Payer: Medicare HMO | Admitting: *Deleted

## 2014-06-01 DIAGNOSIS — Z7901 Long term (current) use of anticoagulants: Secondary | ICD-10-CM

## 2014-06-01 DIAGNOSIS — I2699 Other pulmonary embolism without acute cor pulmonale: Secondary | ICD-10-CM

## 2014-06-01 DIAGNOSIS — I4891 Unspecified atrial fibrillation: Secondary | ICD-10-CM

## 2014-06-01 DIAGNOSIS — Z5181 Encounter for therapeutic drug level monitoring: Secondary | ICD-10-CM

## 2014-06-01 LAB — POCT INR: INR: 1.7

## 2014-06-08 ENCOUNTER — Other Ambulatory Visit: Payer: Self-pay

## 2014-06-08 MED ORDER — WARFARIN SODIUM 5 MG PO TABS: 5.0000 mg | ORAL_TABLET | Freq: Once | ORAL | Status: DC

## 2014-06-15 ENCOUNTER — Other Ambulatory Visit: Payer: Self-pay | Admitting: *Deleted

## 2014-06-15 MED ORDER — METOPROLOL SUCCINATE ER 25 MG PO TB24: ORAL_TABLET | ORAL | Status: DC

## 2014-06-16 ENCOUNTER — Ambulatory Visit (INDEPENDENT_AMBULATORY_CARE_PROVIDER_SITE_OTHER): Payer: Medicare HMO | Admitting: *Deleted

## 2014-06-16 DIAGNOSIS — I2699 Other pulmonary embolism without acute cor pulmonale: Secondary | ICD-10-CM

## 2014-06-16 DIAGNOSIS — I4891 Unspecified atrial fibrillation: Secondary | ICD-10-CM

## 2014-06-16 DIAGNOSIS — Z7901 Long term (current) use of anticoagulants: Secondary | ICD-10-CM

## 2014-06-16 DIAGNOSIS — Z5181 Encounter for therapeutic drug level monitoring: Secondary | ICD-10-CM

## 2014-06-16 LAB — POCT INR: INR: 2.6

## 2014-07-07 ENCOUNTER — Other Ambulatory Visit (HOSPITAL_COMMUNITY): Payer: Self-pay | Admitting: *Deleted

## 2014-07-07 DIAGNOSIS — I6523 Occlusion and stenosis of bilateral carotid arteries: Secondary | ICD-10-CM

## 2014-07-14 ENCOUNTER — Ambulatory Visit (INDEPENDENT_AMBULATORY_CARE_PROVIDER_SITE_OTHER): Payer: Medicare HMO | Admitting: *Deleted

## 2014-07-14 ENCOUNTER — Ambulatory Visit (HOSPITAL_COMMUNITY): Payer: Medicare HMO | Attending: Cardiology | Admitting: Cardiology

## 2014-07-14 DIAGNOSIS — Z87891 Personal history of nicotine dependence: Secondary | ICD-10-CM | POA: Diagnosis not present

## 2014-07-14 DIAGNOSIS — E119 Type 2 diabetes mellitus without complications: Secondary | ICD-10-CM | POA: Insufficient documentation

## 2014-07-14 DIAGNOSIS — I2699 Other pulmonary embolism without acute cor pulmonale: Secondary | ICD-10-CM

## 2014-07-14 DIAGNOSIS — I6522 Occlusion and stenosis of left carotid artery: Secondary | ICD-10-CM

## 2014-07-14 DIAGNOSIS — Z7901 Long term (current) use of anticoagulants: Secondary | ICD-10-CM

## 2014-07-14 DIAGNOSIS — I4891 Unspecified atrial fibrillation: Secondary | ICD-10-CM

## 2014-07-14 DIAGNOSIS — I10 Essential (primary) hypertension: Secondary | ICD-10-CM | POA: Insufficient documentation

## 2014-07-14 DIAGNOSIS — Z5181 Encounter for therapeutic drug level monitoring: Secondary | ICD-10-CM

## 2014-07-14 DIAGNOSIS — I6523 Occlusion and stenosis of bilateral carotid arteries: Secondary | ICD-10-CM

## 2014-07-14 LAB — POCT INR: INR: 2.6

## 2014-07-14 NOTE — Progress Notes (Signed)
Carotid duplex performed 

## 2014-07-15 ENCOUNTER — Encounter (HOSPITAL_COMMUNITY): Payer: Medicare HMO

## 2014-07-15 ENCOUNTER — Other Ambulatory Visit: Payer: Self-pay

## 2014-07-17 ENCOUNTER — Encounter: Payer: Self-pay | Admitting: Cardiology

## 2014-07-23 ENCOUNTER — Other Ambulatory Visit: Payer: Self-pay | Admitting: Cardiology

## 2014-07-23 ENCOUNTER — Other Ambulatory Visit: Payer: Self-pay | Admitting: Internal Medicine

## 2014-07-24 ENCOUNTER — Encounter (HOSPITAL_COMMUNITY): Payer: Self-pay | Admitting: Emergency Medicine

## 2014-07-24 ENCOUNTER — Emergency Department (HOSPITAL_COMMUNITY)
Admission: EM | Admit: 2014-07-24 | Discharge: 2014-07-24 | Disposition: A | Discharge status: Home / Self Care | Payer: Medicare HMO | Attending: Emergency Medicine | Admitting: Emergency Medicine

## 2014-07-24 ENCOUNTER — Emergency Department (HOSPITAL_COMMUNITY): Payer: Medicare HMO

## 2014-07-24 DIAGNOSIS — E876 Hypokalemia: Secondary | ICD-10-CM | POA: Insufficient documentation

## 2014-07-24 DIAGNOSIS — Z8739 Personal history of other diseases of the musculoskeletal system and connective tissue: Secondary | ICD-10-CM | POA: Diagnosis not present

## 2014-07-24 DIAGNOSIS — Z8719 Personal history of other diseases of the digestive system: Secondary | ICD-10-CM | POA: Diagnosis not present

## 2014-07-24 DIAGNOSIS — Z79899 Other long term (current) drug therapy: Secondary | ICD-10-CM | POA: Insufficient documentation

## 2014-07-24 DIAGNOSIS — Z853 Personal history of malignant neoplasm of breast: Secondary | ICD-10-CM | POA: Diagnosis not present

## 2014-07-24 DIAGNOSIS — I2510 Atherosclerotic heart disease of native coronary artery without angina pectoris: Secondary | ICD-10-CM | POA: Diagnosis not present

## 2014-07-24 DIAGNOSIS — I5032 Chronic diastolic (congestive) heart failure: Secondary | ICD-10-CM | POA: Insufficient documentation

## 2014-07-24 DIAGNOSIS — E119 Type 2 diabetes mellitus without complications: Secondary | ICD-10-CM | POA: Insufficient documentation

## 2014-07-24 DIAGNOSIS — Z7901 Long term (current) use of anticoagulants: Secondary | ICD-10-CM | POA: Diagnosis not present

## 2014-07-24 DIAGNOSIS — Z86010 Personal history of colonic polyps: Secondary | ICD-10-CM | POA: Insufficient documentation

## 2014-07-24 DIAGNOSIS — N390 Urinary tract infection, site not specified: Secondary | ICD-10-CM | POA: Diagnosis not present

## 2014-07-24 DIAGNOSIS — I10 Essential (primary) hypertension: Secondary | ICD-10-CM | POA: Diagnosis not present

## 2014-07-24 DIAGNOSIS — R1030 Lower abdominal pain, unspecified: Secondary | ICD-10-CM | POA: Diagnosis present

## 2014-07-24 DIAGNOSIS — Z86711 Personal history of pulmonary embolism: Secondary | ICD-10-CM | POA: Diagnosis not present

## 2014-07-24 DIAGNOSIS — I4891 Unspecified atrial fibrillation: Secondary | ICD-10-CM | POA: Diagnosis not present

## 2014-07-24 DIAGNOSIS — Z950 Presence of cardiac pacemaker: Secondary | ICD-10-CM | POA: Insufficient documentation

## 2014-07-24 DIAGNOSIS — Z87891 Personal history of nicotine dependence: Secondary | ICD-10-CM | POA: Insufficient documentation

## 2014-07-24 DIAGNOSIS — Z90710 Acquired absence of both cervix and uterus: Secondary | ICD-10-CM | POA: Diagnosis not present

## 2014-07-24 DIAGNOSIS — R109 Unspecified abdominal pain: Secondary | ICD-10-CM

## 2014-07-24 DIAGNOSIS — I4892 Unspecified atrial flutter: Secondary | ICD-10-CM | POA: Diagnosis not present

## 2014-07-24 DIAGNOSIS — Z86718 Personal history of other venous thrombosis and embolism: Secondary | ICD-10-CM | POA: Diagnosis not present

## 2014-07-24 LAB — CBC WITH DIFFERENTIAL/PLATELET
Basophils Absolute: 0 10*3/uL (ref 0.0–0.1)
Basophils Relative: 1 % (ref 0–1)
EOS ABS: 0.1 10*3/uL (ref 0.0–0.7)
Eosinophils Relative: 2 % (ref 0–5)
HEMATOCRIT: 36.9 % (ref 36.0–46.0)
HEMOGLOBIN: 12.1 g/dL (ref 12.0–15.0)
Lymphocytes Relative: 21 % (ref 12–46)
Lymphs Abs: 0.8 10*3/uL (ref 0.7–4.0)
MCH: 30.4 pg (ref 26.0–34.0)
MCHC: 32.8 g/dL (ref 30.0–36.0)
MCV: 92.7 fL (ref 78.0–100.0)
MONO ABS: 0.4 10*3/uL (ref 0.1–1.0)
MONOS PCT: 10 % (ref 3–12)
NEUTROS ABS: 2.5 10*3/uL (ref 1.7–7.7)
Neutrophils Relative %: 66 % (ref 43–77)
Platelets: 236 10*3/uL (ref 150–400)
RBC: 3.98 MIL/uL (ref 3.87–5.11)
RDW: 14.7 % (ref 11.5–15.5)
WBC: 3.7 10*3/uL — ABNORMAL LOW (ref 4.0–10.5)

## 2014-07-24 LAB — COMPREHENSIVE METABOLIC PANEL
ALBUMIN: 3.4 g/dL — AB (ref 3.5–5.2)
ALT: 9 U/L (ref 0–35)
ANION GAP: 13 (ref 5–15)
AST: 15 U/L (ref 0–37)
Alkaline Phosphatase: 108 U/L (ref 39–117)
BILIRUBIN TOTAL: 0.6 mg/dL (ref 0.3–1.2)
BUN: 17 mg/dL (ref 6–23)
CHLORIDE: 101 meq/L (ref 96–112)
CO2: 27 mEq/L (ref 19–32)
Calcium: 9.6 mg/dL (ref 8.4–10.5)
Creatinine, Ser: 1.32 mg/dL — ABNORMAL HIGH (ref 0.50–1.10)
GFR calc Af Amer: 43 mL/min — ABNORMAL LOW (ref >90)
GFR, EST NON AFRICAN AMERICAN: 37 mL/min — AB (ref >90)
Glucose, Bld: 124 mg/dL — ABNORMAL HIGH (ref 70–99)
Potassium: 3.9 mEq/L (ref 3.7–5.3)
Sodium: 141 mEq/L (ref 137–147)
Total Protein: 7.2 g/dL (ref 6.0–8.3)

## 2014-07-24 LAB — URINE MICROSCOPIC-ADD ON

## 2014-07-24 LAB — URINALYSIS, ROUTINE W REFLEX MICROSCOPIC
Bilirubin Urine: NEGATIVE
Glucose, UA: NEGATIVE mg/dL
Hgb urine dipstick: NEGATIVE
KETONES UR: NEGATIVE mg/dL
NITRITE: POSITIVE — AB
Protein, ur: NEGATIVE mg/dL
Specific Gravity, Urine: >1.046 — ABNORMAL HIGH (ref 1.005–1.030)
Urobilinogen, UA: 1 mg/dL (ref 0.0–1.0)
pH: 8.5 — ABNORMAL HIGH (ref 5.0–8.0)

## 2014-07-24 LAB — PROTIME-INR
INR: 2.2 — AB (ref 0.00–1.49)
Prothrombin Time: 24.7 seconds — ABNORMAL HIGH (ref 11.6–15.2)

## 2014-07-24 LAB — I-STAT CG4 LACTIC ACID, ED: LACTIC ACID, VENOUS: 1.17 mmol/L (ref 0.5–2.2)

## 2014-07-24 LAB — LIPASE, BLOOD: Lipase: 56 U/L (ref 11–59)

## 2014-07-24 MED ORDER — IOHEXOL 300 MG/ML  SOLN
100.0000 mL | Freq: Once | INTRAMUSCULAR | Status: AC | PRN
  Administered 2014-07-24: 100 mL via INTRAVENOUS

## 2014-07-24 MED ORDER — SODIUM CHLORIDE 0.9 % IV BOLUS (SEPSIS)
1000.0000 mL | Freq: Once | INTRAVENOUS | Status: AC
  Administered 2014-07-24: 1000 mL via INTRAVENOUS

## 2014-07-24 MED ORDER — CEPHALEXIN 500 MG PO CAPS: 500.0000 mg | ORAL_CAPSULE | Freq: Four times a day (QID) | ORAL | Status: DC

## 2014-07-24 MED ORDER — OXYCODONE-ACETAMINOPHEN 5-325 MG PO TABS: 1.0000 | ORAL_TABLET | ORAL | Status: DC | PRN

## 2014-07-24 MED ORDER — ONDANSETRON HCL 4 MG/2ML IJ SOLN
4.0000 mg | Freq: Once | INTRAMUSCULAR | Status: AC
  Administered 2014-07-24: 4 mg via INTRAVENOUS
  Filled 2014-07-24: qty 2

## 2014-07-24 MED ORDER — MORPHINE SULFATE 4 MG/ML IJ SOLN
4.0000 mg | Freq: Once | INTRAMUSCULAR | Status: AC
  Administered 2014-07-24: 4 mg via INTRAVENOUS
  Filled 2014-07-24: qty 1

## 2014-07-24 NOTE — ED Notes (Signed)
Patient aware that a urine sample is needed, will try when able.

## 2014-07-24 NOTE — ED Notes (Signed)
Per Time, RN hold off on type and screen blood draw

## 2014-07-24 NOTE — ED Provider Notes (Signed)
CSN: [id number]     Arrival date & time 07/24/14  0530 History   First MD Initiated Contact with Patient 07/24/14 [phone]     Chief Complaint  Patient presents with  . Abdominal Pain  . Diarrhea     (Consider location/radiation/quality/duration/timing/severity/associated sxs/prior Treatment) HPI Comments: Patient here due to increased lower abdominal pain with watery diarrhea for the past 3 days. No fever or chills. No vomiting noted. Pain is located suprapubic area but not associated with dysuria or hematuria. No prior history of colonic issues. Symptoms persisted nothing makes them better or worse. No prior history of same. Denies any recent use of antibiotics or travel history. No syncope or near syncope. No treatment use prior to arrival.  Patient is a 80 y.o. female presenting with abdominal pain and diarrhea. The history is provided by the patient.  Abdominal Pain Associated symptoms: diarrhea   Diarrhea Associated symptoms: abdominal pain     Past Medical History  Diagnosis Date  . Hypertension   . Diabetes mellitus   . Tachy-brady syndrome     s/p pacer 07/2009  . Atrial fibrillation     flecainide; coumadin  . Atrial flutter     November, 2010, flecainide, Coumadin  . Chronic diastolic heart failure     echo 10/10: mild LVH, EF 55-60%, mild AI, mild MR, mild to mod LAE, mild RVE, severe RAE, mod TR, PASP 53;  Echo 3/14:  Mild LVH, EF 55%, Tr AI, MAC, mild MR, mod LAE, PASP 31, trivial eff.  . Shortness of breath     myoview 11/10: EF 65%, no ischemia  . Pulmonary HTN     53 mmHg echo, 2010, moderate TR, mild right ventricular enlargement  . DVT (deep venous thrombosis)     Pulmonary embolus  . Pulmonary embolus 2006    2006, with DVT  . Carotid artery disease     [id number]: RICA [id number], LICA [id number]  . Thyroid nodule     non-neoplastic goiter  . Breast cancer   . Ejection fraction     EF 60%, echo, October, 2010  . Warfarin anticoagulation     Atrial fibrillation  .  Pacemaker     November, 2010, Dr Rayann Heman - MDT ADDRL1 [place] DC PPM ser #: [id number]    . S/P laminectomy     June 22, 2011  . Syncope     2010, treated with pacemaker  . Drug therapy     FLECAINIDE  . Mitral regurgitation     mild, echo, October, 2010  . Mediastinal lymphadenopathy   . Venous insufficiency     Chronic  . Hypokalemia     October, 2012, potassium dose increased  . Hiatal hernia 2006    EGD  . Acute gastric ulcer without mention of hemorrhage, perforation, or obstruction 2006    EGD  . Esophagitis, unspecified 2006    EGD  . Allergy     seasonal  . Diverticulosis   . Hx of adenomatous colonic polyps   . Fecal incontinence   . Breast cancer   . Osteopenia    Past Surgical History  Procedure Laterality Date  . Breast surgery      Left  . Abdominal hysterectomy    . Back surgery      1973, 2012   . Ankle surgery     Family History  Problem Relation Age of Onset  . Heart disease Mother   . COPD Father   . Colon cancer Neg Hx  History  Substance Use Topics  . Smoking status: Former Smoker -- 4.00 packs/day    Quit date: 10/26/1998  . Smokeless tobacco: Never Used  . Alcohol Use: No   OB History   Grav Para Term Preterm Abortions TAB SAB Ect Mult Living                 Review of Systems  Gastrointestinal: Positive for abdominal pain and diarrhea.  All other systems reviewed and are negative.     Allergies  Review of patient's allergies indicates no known allergies.  Home Medications   Prior to Admission medications   Medication Sig Start Date End Date Taking? Authorizing Provider  allopurinol (ZYLOPRIM) 300 MG tablet Take 150 mg by mouth daily as needed (gout).  07/04/14  Yes Historical Provider, MD  anastrozole (ARIMIDEX) 1 MG tablet Take 1 mg by mouth daily.   Yes Historical Provider, MD  bethanechol (URECHOLINE) 25 MG tablet Take 25 mg by mouth daily.   Yes Historical Provider, MD  dicyclomine (BENTYL) 10 MG capsule Take 10 mg  by mouth 2 (two) times daily.   Yes Historical Provider, MD  flecainide (TAMBOCOR) 100 MG tablet Take 100 mg by mouth 2 (two) times daily.   Yes Historical Provider, MD  furosemide (LASIX) 40 MG tablet Take 40 mg by mouth 2 (two) times daily.   Yes Historical Provider, MD  metFORMIN (GLUCOPHAGE) 500 MG tablet Take 750 mg by mouth 2 (two) times daily with a meal.   Yes Historical Provider, MD  metoprolol succinate (TOPROL-XL) 25 MG 24 hr tablet Take 25 mg by mouth daily.   Yes Historical Provider, MD  polyethylene glycol (MIRALAX) packet Take 17 g by mouth daily. 09/19/13  Yes Dot Lanes, MD  potassium chloride 20 MEQ/15ML (10%) solution Take 20 mEq by mouth 2 (two) times daily.   Yes Historical Provider, MD  promethazine (PHENERGAN) 25 MG tablet Take 1 tablet (25 mg total) by mouth every 8 (eight) hours as needed for nausea or vomiting. 09/02/13  Yes Resa Miner Lawyer, PA-C  pyridoxine (B-6) 100 MG tablet Take 200 mg by mouth daily.    Yes Historical Provider, MD  vitamin B-12 (CYANOCOBALAMIN) 500 MCG tablet Take 500 mcg by mouth daily.    Yes Historical Provider, MD  warfarin (COUMADIN) 5 MG tablet Take 1 tablet (5 mg total) by mouth one time only at 6 PM. 06/08/14  Yes Carlena Bjornstad, MD   BP 162/72  Pulse 60  Temp(Src) 97.8 F (36.6 C) (Oral)  Resp 18  SpO2 96% Physical Exam  Nursing note and vitals reviewed. Constitutional: She is oriented to person, place, and time. She appears well-developed and well-nourished.  Non-toxic appearance. No distress.  HENT:  Head: Normocephalic and atraumatic.  Eyes: Conjunctivae, EOM and lids are normal. Pupils are equal, round, and reactive to light.  Neck: Normal range of motion. Neck supple. No tracheal deviation present. No mass present.  Cardiovascular: Normal rate, regular rhythm and normal heart sounds.  Exam reveals no gallop.   No murmur heard. Pulmonary/Chest: Effort normal and breath sounds normal. No stridor. No respiratory distress.  She has no decreased breath sounds. She has no wheezes. She has no rhonchi. She has no rales.  Abdominal: Soft. Normal appearance and bowel sounds are normal. She exhibits no distension. There is tenderness in the suprapubic area. There is no rigidity, no rebound, no guarding and no CVA tenderness.    Musculoskeletal: Normal range of motion. She exhibits no  edema and no tenderness.  Neurological: She is alert and oriented to person, place, and time. She has normal strength. No cranial nerve deficit or sensory deficit. GCS eye subscore is 4. GCS verbal subscore is 5. GCS motor subscore is 6.  Skin: Skin is warm and dry. No abrasion and no rash noted.  Psychiatric: She has a normal mood and affect. Her speech is normal and behavior is normal.    ED Course  Procedures (including critical care time) Labs Review Labs Reviewed  CBC WITH DIFFERENTIAL - Abnormal; Notable for the following:    WBC 3.7 (*)    All other components within normal limits  COMPREHENSIVE METABOLIC PANEL - Abnormal; Notable for the following:    Glucose, Bld 124 (*)    Creatinine, Ser 1.32 (*)    Albumin 3.4 (*)    GFR calc non Af Amer 37 (*)    GFR calc Af Amer 43 (*)    All other components within normal limits  URINALYSIS, ROUTINE W REFLEX MICROSCOPIC - Abnormal; Notable for the following:    APPearance CLOUDY (*)    Specific Gravity, Urine >1.046 (*)    pH 8.5 (*)    Nitrite POSITIVE (*)    Leukocytes, UA MODERATE (*)    All other components within normal limits  PROTIME-INR - Abnormal; Notable for the following:    Prothrombin Time 24.7 (*)    INR 2.20 (*)    All other components within normal limits  URINE MICROSCOPIC-ADD ON - Abnormal; Notable for the following:    Squamous Epithelial / LPF FEW (*)    Bacteria, UA MANY (*)    All other components within normal limits  URINE CULTURE  LIPASE, BLOOD  I-STAT CG4 LACTIC ACID, ED  TYPE AND SCREEN    Imaging Review Ct Abdomen Pelvis W  Contrast  07/24/2014   CLINICAL DATA:  Patient complaining of abdominal pain and diarrhea today.  EXAM: CT ABDOMEN AND PELVIS WITH CONTRAST  TECHNIQUE: Multidetector CT imaging of the abdomen and pelvis was performed using the standard protocol following bolus administration of intravenous contrast.  CONTRAST:  147mL OMNIPAQUE IOHEXOL 300 MG/ML  SOLN  COMPARISON:  None.  FINDINGS: There are numerous left colon diverticula. There is no diverticulitis. There is no colonic wall thickening or inflammatory change. Normal small bowel.  There is dependent subsegmental atelectasis at the lung bases. Heart is normal in size. There is a moderate-sized hiatal hernia.  Small liver calcifications consistent with healed granuloma. Liver otherwise unremarkable.  There are multiple small low-density lesions in the spleen which persists on the delayed sequence. Largest lies near the dome measuring 11 mm in size. These are nonspecific. Spleen is normal in overall size.  Gallbladder and pancreas are unremarkable.  No adrenal masses.  There is bilateral renal cortical thinning, greater on the right where there are also areas of renal scarring. No renal masses or stones. No hydronephrosis. Normal ureters. Unremarkable bladder.  Uterus is surgically absent.  No pelvic masses.  No adenopathy.  No abnormal fluid collections.  There is atherosclerotic calcifications along the abdominal aorta and the branch vessels. There is a small focal bulge along the posterior aspect of the infrarenal abdominal aorta which may reflect an area of ulcerated plaque. The diameter of the aorta in this location is 2.5 cm.  A fat containing left inguinal hernia is noted.  Bony structures are demineralized. There is a mild wedge-shaped compression deformity of T1. Schmorl's nodes are noted along the upper  endplates of T1 and 624THL. No osteoblastic or osteolytic lesions.  IMPRESSION: 1. No acute findings. 2. Left colon diverticulosis. No evidence of  diverticulitis. No bowel wall thickening or inflammatory changes. 3. Multiple small low-density lesions in the spleen. These are nonspecific. Differential diagnosis includes benign and neoplastic etiologies including multiple small cysts or hemangiomas. Neoplastic differential diagnosis is primarily lymphoma. There is no spleen enlargement. Patient had an unenhanced CT chest which included the spleen on 10/14/2011. These lesions are not apparent on exam. Consider short-term follow-up with repeat CT in 3-4 months. Lesions could be further characterized with MRI with without contrast. 4. Small focal bulge along the posterior margin of the infrarenal abdominal consistent with a small aneurysm. Aorta measures 2.5 cm in this location. 5. Other chronic findings as detailed.   Electronically Signed   By: Lajean Manes M.D.   On: 07/24/2014 08:55     EKG Interpretation None      MDM   Final diagnoses:  Abdominal pain    Patient with likely early UTI will be placed on antibiotics. CT scan results discussed with her importance of a repeat CAT scan for the evaluation of her spleen lesions was discussed with her. She will follow-up with her PCP for this. No evidence of surgical abdomen at time of discharge.    Leota Jacobsen, MD 07/24/14 [phone]

## 2014-07-24 NOTE — ED Notes (Signed)
Pt arrived via EMS  She resides at home she called EMS tonight for abdominal pain and diarrhea,  Pt is alert and oriented in NAD

## 2014-07-24 NOTE — ED Notes (Signed)
MD at bedside. 

## 2014-07-24 NOTE — Discharge Instructions (Signed)
Please follow-up with Dr. Carlis Abbott about your abnormal abdominal CAT scan that showed abnormal tissue in your spleen which could be cancerous. You need to have a repeat abdominal CAT scan within the next 3-4 months. Urinary Tract Infection Urinary tract infections (UTIs) can develop anywhere along your urinary tract. Your urinary tract is your body's drainage system for removing wastes and extra water. Your urinary tract includes two kidneys, two ureters, a bladder, and a urethra. Your kidneys are a pair of bean-shaped organs. Each kidney is about the size of your fist. They are located below your ribs, one on each side of your spine. CAUSES Infections are caused by microbes, which are microscopic organisms, including fungi, viruses, and bacteria. These organisms are so small that they can only be seen through a microscope. Bacteria are the microbes that most commonly cause UTIs. SYMPTOMS  Symptoms of UTIs may vary by age and gender of the patient and by the location of the infection. Symptoms in young women typically include a frequent and intense urge to urinate and a painful, burning feeling in the bladder or urethra during urination. Older women and men are more likely to be tired, shaky, and weak and have muscle aches and abdominal pain. A fever may mean the infection is in your kidneys. Other symptoms of a kidney infection include pain in your back or sides below the ribs, nausea, and vomiting. DIAGNOSIS To diagnose a UTI, your caregiver will ask you about your symptoms. Your caregiver also will ask to provide a urine sample. The urine sample will be tested for bacteria and white blood cells. White blood cells are made by your body to help fight infection. TREATMENT  Typically, UTIs can be treated with medication. Because most UTIs are caused by a bacterial infection, they usually can be treated with the use of antibiotics. The choice of antibiotic and length of treatment depend on your symptoms and the  type of bacteria causing your infection. HOME CARE INSTRUCTIONS  If you were prescribed antibiotics, take them exactly as your caregiver instructs you. Finish the medication even if you feel better after you have only taken some of the medication.  Drink enough water and fluids to keep your urine clear or pale yellow.  Avoid caffeine, tea, and carbonated beverages. They tend to irritate your bladder.  Empty your bladder often. Avoid holding urine for long periods of time.  Empty your bladder before and after sexual intercourse.  After a bowel movement, women should cleanse from front to back. Use each tissue only once. SEEK MEDICAL CARE IF:   You have back pain.  You develop a fever.  Your symptoms do not begin to resolve within 3 days. SEEK IMMEDIATE MEDICAL CARE IF:   You have severe back pain or lower abdominal pain.  You develop chills.  You have nausea or vomiting.  You have continued burning or discomfort with urination. MAKE SURE YOU:   Understand these instructions.  Will watch your condition.  Will get help right away if you are not doing well or get worse. Document Released: 06/26/2005 Document Revised: 03/17/2012 Document Reviewed: 10/25/2011 [facility] Patient Information 2015 [place], [place]. This information is not intended to replace advice given to you by your health care provider. Make sure you discuss any questions you have with your health care provider.

## 2014-07-25 LAB — URINE CULTURE: Colony Count: >=100000

## 2014-07-28 ENCOUNTER — Other Ambulatory Visit: Payer: Self-pay | Admitting: *Deleted

## 2014-07-28 MED ORDER — ANASTROZOLE 1 MG PO TABS: 1.0000 mg | ORAL_TABLET | Freq: Every day | ORAL | Status: DC

## 2014-07-28 NOTE — Telephone Encounter (Signed)
Pt called for refill on Anastrozole.  Informed rx faxed to [facility].  She verbalized understanding.

## 2014-08-02 ENCOUNTER — Emergency Department (HOSPITAL_BASED_OUTPATIENT_CLINIC_OR_DEPARTMENT_OTHER): Payer: Medicare HMO

## 2014-08-02 ENCOUNTER — Encounter (HOSPITAL_BASED_OUTPATIENT_CLINIC_OR_DEPARTMENT_OTHER): Payer: Self-pay | Admitting: *Deleted

## 2014-08-02 ENCOUNTER — Inpatient Hospital Stay (HOSPITAL_BASED_OUTPATIENT_CLINIC_OR_DEPARTMENT_OTHER)
Admission: EM | Admit: 2014-08-02 | Discharge: 2014-08-05 | DRG: 392 | Disposition: A | Discharge status: Home / Self Care | Payer: Medicare HMO | Attending: Internal Medicine | Admitting: Internal Medicine

## 2014-08-02 DIAGNOSIS — I272 Other secondary pulmonary hypertension: Secondary | ICD-10-CM | POA: Diagnosis present

## 2014-08-02 DIAGNOSIS — R748 Abnormal levels of other serum enzymes: Secondary | ICD-10-CM | POA: Diagnosis present

## 2014-08-02 DIAGNOSIS — N179 Acute kidney failure, unspecified: Secondary | ICD-10-CM | POA: Diagnosis present

## 2014-08-02 DIAGNOSIS — I5032 Chronic diastolic (congestive) heart failure: Secondary | ICD-10-CM | POA: Diagnosis present

## 2014-08-02 DIAGNOSIS — I2510 Atherosclerotic heart disease of native coronary artery without angina pectoris: Secondary | ICD-10-CM | POA: Diagnosis present

## 2014-08-02 DIAGNOSIS — K922 Gastrointestinal hemorrhage, unspecified: Secondary | ICD-10-CM | POA: Diagnosis present

## 2014-08-02 DIAGNOSIS — R791 Abnormal coagulation profile: Secondary | ICD-10-CM | POA: Diagnosis present

## 2014-08-02 DIAGNOSIS — K625 Hemorrhage of anus and rectum: Secondary | ICD-10-CM

## 2014-08-02 DIAGNOSIS — Z950 Presence of cardiac pacemaker: Secondary | ICD-10-CM

## 2014-08-02 DIAGNOSIS — R197 Diarrhea, unspecified: Secondary | ICD-10-CM | POA: Diagnosis not present

## 2014-08-02 DIAGNOSIS — E119 Type 2 diabetes mellitus without complications: Secondary | ICD-10-CM | POA: Diagnosis present

## 2014-08-02 DIAGNOSIS — I4891 Unspecified atrial fibrillation: Secondary | ICD-10-CM | POA: Diagnosis present

## 2014-08-02 DIAGNOSIS — Z87891 Personal history of nicotine dependence: Secondary | ICD-10-CM

## 2014-08-02 DIAGNOSIS — I10 Essential (primary) hypertension: Secondary | ICD-10-CM | POA: Diagnosis present

## 2014-08-02 DIAGNOSIS — I340 Nonrheumatic mitral (valve) insufficiency: Secondary | ICD-10-CM | POA: Diagnosis present

## 2014-08-02 DIAGNOSIS — E876 Hypokalemia: Secondary | ICD-10-CM | POA: Diagnosis present

## 2014-08-02 DIAGNOSIS — Z87440 Personal history of urinary (tract) infections: Secondary | ICD-10-CM

## 2014-08-02 DIAGNOSIS — Z86711 Personal history of pulmonary embolism: Secondary | ICD-10-CM

## 2014-08-02 DIAGNOSIS — I495 Sick sinus syndrome: Secondary | ICD-10-CM | POA: Diagnosis present

## 2014-08-02 DIAGNOSIS — Z885 Allergy status to narcotic agent status: Secondary | ICD-10-CM

## 2014-08-02 DIAGNOSIS — R710 Precipitous drop in hematocrit: Secondary | ICD-10-CM | POA: Diagnosis present

## 2014-08-02 DIAGNOSIS — I872 Venous insufficiency (chronic) (peripheral): Secondary | ICD-10-CM | POA: Diagnosis present

## 2014-08-02 DIAGNOSIS — Z853 Personal history of malignant neoplasm of breast: Secondary | ICD-10-CM

## 2014-08-02 DIAGNOSIS — Z7901 Long term (current) use of anticoagulants: Secondary | ICD-10-CM

## 2014-08-02 LAB — COMPREHENSIVE METABOLIC PANEL
ALT: 16 U/L (ref 0–35)
AST: 22 U/L (ref 0–37)
Albumin: 3.8 g/dL (ref 3.5–5.2)
Alkaline Phosphatase: 99 U/L (ref 39–117)
Anion gap: 14 (ref 5–15)
BILIRUBIN TOTAL: 0.7 mg/dL (ref 0.3–1.2)
BUN: 15 mg/dL (ref 6–23)
CHLORIDE: 101 meq/L (ref 96–112)
CO2: 25 meq/L (ref 19–32)
CREATININE: 1.2 mg/dL — AB (ref 0.50–1.10)
Calcium: 9.2 mg/dL (ref 8.4–10.5)
GFR calc Af Amer: 48 mL/min — ABNORMAL LOW (ref >90)
GFR, EST NON AFRICAN AMERICAN: 41 mL/min — AB (ref >90)
GLUCOSE: 115 mg/dL — AB (ref 70–99)
Potassium: 3.4 mEq/L — ABNORMAL LOW (ref 3.7–5.3)
Sodium: 140 mEq/L (ref 137–147)
Total Protein: 8 g/dL (ref 6.0–8.3)

## 2014-08-02 LAB — URINALYSIS, ROUTINE W REFLEX MICROSCOPIC
Bilirubin Urine: NEGATIVE
GLUCOSE, UA: NEGATIVE mg/dL
HGB URINE DIPSTICK: NEGATIVE
KETONES UR: NEGATIVE mg/dL
Nitrite: NEGATIVE
PH: 5.5 (ref 5.0–8.0)
PROTEIN: NEGATIVE mg/dL
Specific Gravity, Urine: 1.011 (ref 1.005–1.030)
Urobilinogen, UA: 0.2 mg/dL (ref 0.0–1.0)

## 2014-08-02 LAB — URINE MICROSCOPIC-ADD ON

## 2014-08-02 LAB — CBC WITH DIFFERENTIAL/PLATELET
Basophils Absolute: 0.1 10*3/uL (ref 0.0–0.1)
Basophils Relative: 1 % (ref 0–1)
Eosinophils Absolute: 0.1 10*3/uL (ref 0.0–0.7)
Eosinophils Relative: 2 % (ref 0–5)
HEMATOCRIT: 38.7 % (ref 36.0–46.0)
HEMOGLOBIN: 12.9 g/dL (ref 12.0–15.0)
LYMPHS ABS: 1 10*3/uL (ref 0.7–4.0)
LYMPHS PCT: 18 % (ref 12–46)
MCH: 30.5 pg (ref 26.0–34.0)
MCHC: 33.3 g/dL (ref 30.0–36.0)
MCV: 91.5 fL (ref 78.0–100.0)
MONO ABS: 0.5 10*3/uL (ref 0.1–1.0)
MONOS PCT: 9 % (ref 3–12)
NEUTROS ABS: 3.9 10*3/uL (ref 1.7–7.7)
Neutrophils Relative %: 70 % (ref 43–77)
Platelets: 298 10*3/uL (ref 150–400)
RBC: 4.23 MIL/uL (ref 3.87–5.11)
RDW: 14.7 % (ref 11.5–15.5)
WBC: 5.6 10*3/uL (ref 4.0–10.5)

## 2014-08-02 LAB — PROTIME-INR
INR: 4.96 — ABNORMAL HIGH (ref 0.00–1.49)
Prothrombin Time: 46.1 seconds — ABNORMAL HIGH (ref 11.6–15.2)

## 2014-08-02 LAB — I-STAT CG4 LACTIC ACID, ED: Lactic Acid, Venous: 1.48 mmol/L (ref 0.5–2.2)

## 2014-08-02 LAB — OCCULT BLOOD X 1 CARD TO LAB, STOOL: FECAL OCCULT BLD: POSITIVE — AB

## 2014-08-02 LAB — LIPASE, BLOOD: Lipase: 69 U/L — ABNORMAL HIGH (ref 11–59)

## 2014-08-02 MED ORDER — METRONIDAZOLE IN NACL 5-0.79 MG/ML-% IV SOLN
500.0000 mg | Freq: Once | INTRAVENOUS | Status: AC
  Administered 2014-08-02: 500 mg via INTRAVENOUS
  Filled 2014-08-02: qty 100

## 2014-08-02 MED ORDER — IOHEXOL 300 MG/ML  SOLN
80.0000 mL | Freq: Once | INTRAMUSCULAR | Status: AC | PRN
  Administered 2014-08-02: 80 mL via INTRAVENOUS

## 2014-08-02 MED ORDER — ONDANSETRON HCL 4 MG/2ML IJ SOLN
4.0000 mg | Freq: Once | INTRAMUSCULAR | Status: AC
  Administered 2014-08-02: 4 mg via INTRAVENOUS
  Filled 2014-08-02: qty 2

## 2014-08-02 MED ORDER — IOHEXOL 300 MG/ML  SOLN
25.0000 mL | Freq: Once | INTRAMUSCULAR | Status: AC | PRN
  Administered 2014-08-02: 25 mL via ORAL

## 2014-08-02 MED ORDER — SODIUM CHLORIDE 0.9 % IV BOLUS (SEPSIS)
1000.0000 mL | Freq: Once | INTRAVENOUS | Status: AC
  Administered 2014-08-02: 1000 mL via INTRAVENOUS

## 2014-08-02 MED ORDER — CIPROFLOXACIN IN D5W 400 MG/200ML IV SOLN
400.0000 mg | Freq: Once | INTRAVENOUS | Status: AC
  Administered 2014-08-02: 400 mg via INTRAVENOUS
  Filled 2014-08-02: qty 200

## 2014-08-02 MED ORDER — PHYTONADIONE 5 MG PO TABS
5.0000 mg | ORAL_TABLET | Freq: Once | ORAL | Status: AC
  Administered 2014-08-02: 5 mg via ORAL
  Filled 2014-08-02: qty 1

## 2014-08-02 NOTE — ED Notes (Signed)
During obtaining orthostatics, when Pt stood up she left like her legs were giving out on her.

## 2014-08-02 NOTE — ED Provider Notes (Signed)
CSN: [id number]     Arrival date & time 08/02/14  1553 History   First MD Initiated Contact with Patient 08/02/14 1653     Chief Complaint  Patient presents with  . Diarrhea     (Consider location/radiation/quality/duration/timing/severity/associated sxs/prior Treatment) HPI Comments: Patient presents with intermittent diarrhea over the past several days with crampy lower abdominal pain. She was seen on October 25 for similar symptoms. Diagnosed with a UTI. She felt the Keflex made her diarrhea worse and she stopped it after about one week. She reports 3 episodes of diarrhea since yesterday that are nonbloody. She denies any abdominal pain currently. Denies any chest pain or shortness of breath. Denies any recent travel history or sick contacts. No syncope or near syncope. No dysuria or hematuria.she has a history of hypertensions, diabetes, pacemaker, on Coumadin for history of atrial fibrillation and PE.  The history is provided by the patient and a relative.    Past Medical History  Diagnosis Date  . Hypertension   . Diabetes mellitus   . Tachy-brady syndrome     s/p pacer 07/2009  . Atrial fibrillation     flecainide; coumadin  . Atrial flutter     November, 2010, flecainide, Coumadin  . Chronic diastolic heart failure     echo 10/10: mild LVH, EF 55-60%, mild AI, mild MR, mild to mod LAE, mild RVE, severe RAE, mod TR, PASP 53;  Echo 3/14:  Mild LVH, EF 55%, Tr AI, MAC, mild MR, mod LAE, PASP 31, trivial eff.  . Shortness of breath     myoview 11/10: EF 65%, no ischemia  . Pulmonary HTN     53 mmHg echo, 2010, moderate TR, mild right ventricular enlargement  . DVT (deep venous thrombosis)     Pulmonary embolus  . Pulmonary embolus 2006    2006, with DVT  . Carotid artery disease     [id number]: RICA [id number], LICA [id number]  . Thyroid nodule     non-neoplastic goiter  . Breast cancer   . Ejection fraction     EF 60%, echo, October, 2010  . Warfarin anticoagulation     Atrial  fibrillation  . Pacemaker     November, 2010, Dr Rayann Heman - MDT ADDRL1 [place] DC PPM ser #: [id number]    . S/P laminectomy     June 22, 2011  . Syncope     2010, treated with pacemaker  . Drug therapy     FLECAINIDE  . Mitral regurgitation     mild, echo, October, 2010  . Mediastinal lymphadenopathy   . Venous insufficiency     Chronic  . Hypokalemia     October, 2012, potassium dose increased  . Hiatal hernia 2006    EGD  . Acute gastric ulcer without mention of hemorrhage, perforation, or obstruction 2006    EGD  . Esophagitis, unspecified 2006    EGD  . Allergy     seasonal  . Diverticulosis   . Hx of adenomatous colonic polyps   . Fecal incontinence   . Breast cancer   . Osteopenia    Past Surgical History  Procedure Laterality Date  . Breast surgery      Left  . Abdominal hysterectomy    . Back surgery      1973, 2012   . Ankle surgery     Family History  Problem Relation Age of Onset  . Heart disease Mother   . COPD Father   . Colon cancer Neg  Hx    History  Substance Use Topics  . Smoking status: Former Smoker -- 4.00 packs/day    Quit date: 10/26/1998  . Smokeless tobacco: Never Used  . Alcohol Use: No   OB History    No data available     Review of Systems  Constitutional: Positive for activity change and appetite change. Negative for fever.  HENT: Negative for congestion and rhinorrhea.   Respiratory: Negative for cough, chest tightness and shortness of breath.   Cardiovascular: Negative for chest pain.  Gastrointestinal: Positive for nausea, abdominal pain and diarrhea. Negative for vomiting.  Genitourinary: Negative for dysuria, hematuria and vaginal discharge.  Musculoskeletal: Negative for myalgias, back pain, arthralgias and neck pain.  Skin: Negative for rash.  Neurological: Negative for dizziness, weakness and headaches.  A complete 10 system review of systems was obtained and all systems are negative except as noted in the HPI  and PMH.      Allergies  Morphine and related  Home Medications   Prior to Admission medications   Medication Sig Start Date End Date Taking? Authorizing Provider  anastrozole (ARIMIDEX) 1 MG tablet Take 1 tablet (1 mg total) by mouth daily. 07/28/14  Yes Heath Lark, MD  bethanechol (URECHOLINE) 25 MG tablet Take 25 mg by mouth daily.   Yes Historical Provider, MD  dicyclomine (BENTYL) 10 MG capsule Take 10 mg by mouth 2 (two) times daily.   Yes Historical Provider, MD  flecainide (TAMBOCOR) 100 MG tablet Take 100 mg by mouth 2 (two) times daily.   Yes Historical Provider, MD  furosemide (LASIX) 40 MG tablet Take 40 mg by mouth 2 (two) times daily.   Yes Historical Provider, MD  metFORMIN (GLUCOPHAGE) 500 MG tablet Take 750 mg by mouth 2 (two) times daily with a meal.   Yes Historical Provider, MD  metoprolol succinate (TOPROL-XL) 25 MG 24 hr tablet Take 25 mg by mouth daily.   Yes Historical Provider, MD  oxyCODONE-acetaminophen (PERCOCET/ROXICET) 5-325 MG per tablet Take 1-2 tablets by mouth every 4 (four) hours as needed for severe pain. 07/24/14  Yes Leota Jacobsen, MD  polyethylene glycol [facility]) packet Take 17 g by mouth daily. Patient taking differently: Take 17 g by mouth daily as needed (for constipation).  09/19/13  Yes Dot Lanes, MD  potassium chloride 20 MEQ/15ML (10%) solution Take 20 mEq by mouth daily.    Yes Historical Provider, MD  pyridoxine (B-6) 100 MG tablet Take 200 mg by mouth daily.    Yes Historical Provider, MD  vitamin B-12 (CYANOCOBALAMIN) 500 MCG tablet Take 500 mcg by mouth daily.    Yes Historical Provider, MD  warfarin (COUMADIN) 5 MG tablet Take 1 tablet (5 mg total) by mouth one time only at 6 PM. Patient taking differently: Take 2.5-5 mg by mouth as directed. Sunday and Thursday 5 mg and all other days 2.5 mg 06/08/14  Yes Carlena Bjornstad, MD  allopurinol (ZYLOPRIM) 300 MG tablet Take 150 mg by mouth daily as needed (pain in fingers/toes.).  07/04/14    Historical Provider, MD  cephALEXin (KEFLEX) 500 MG capsule Take 1 capsule (500 mg total) by mouth 4 (four) times daily. 07/24/14   Leota Jacobsen, MD  metoprolol succinate (TOPROL-XL) 25 MG 24 hr tablet TAKE 2 TABLETS (50 MG TOTAL) BY MOUTH DAILY. 07/26/14   Carlena Bjornstad, MD  promethazine (PHENERGAN) 25 MG tablet Take 1 tablet (25 mg total) by mouth every 8 (eight) hours as needed for nausea  or vomiting. 09/02/13   Resa Miner Lawyer, PA-C   BP 120/52 mmHg  Pulse 61  Temp(Src) 98.2 F (36.8 C) (Oral)  Resp 19  Ht 6' (1.829 m)  Wt 220 lb 10.9 oz (100.1 kg)  BMI 29.92 kg/m2  SpO2 94% Physical Exam  Constitutional: She is oriented to person, place, and time. She appears well-developed and well-nourished. No distress.  HENT:  Head: Normocephalic and atraumatic.  Mouth/Throat: Oropharynx is clear and moist. No oropharyngeal exudate.  Eyes: Conjunctivae and EOM are normal. Pupils are equal, round, and reactive to light.  Neck: Normal range of motion. Neck supple.  No meningismus.  Cardiovascular: Normal rate, regular rhythm, normal heart sounds and intact distal pulses.   No murmur heard. Pulmonary/Chest: Effort normal and breath sounds normal. No respiratory distress.  Abdominal: Soft. There is no tenderness. There is no rebound and no guarding.  Genitourinary:  Brown stool, no gross blood, chaperone present  Musculoskeletal: Normal range of motion. She exhibits no edema or tenderness.  Neurological: She is alert and oriented to person, place, and time. No cranial nerve deficit. She exhibits normal muscle tone. Coordination normal.  No ataxia on finger to nose bilaterally. No pronator drift. 5/5 strength throughout. CN 2-12 intact. Negative Romberg. Equal grip strength. Sensation intact. Gait is normal.   Skin: Skin is warm.  Psychiatric: She has a normal mood and affect. Her behavior is normal.  Nursing note and vitals reviewed.   ED Course  Procedures (including critical care  time) Labs Review Labs Reviewed  URINALYSIS, ROUTINE W REFLEX MICROSCOPIC - Abnormal; Notable for the following:    APPearance CLOUDY (*)    Leukocytes, UA TRACE (*)    All other components within normal limits  URINE MICROSCOPIC-ADD ON - Abnormal; Notable for the following:    Squamous Epithelial / LPF FEW (*)    All other components within normal limits  COMPREHENSIVE METABOLIC PANEL - Abnormal; Notable for the following:    Potassium 3.4 (*)    Glucose, Bld 115 (*)    Creatinine, Ser 1.20 (*)    GFR calc non Af Amer 41 (*)    GFR calc Af Amer 48 (*)    All other components within normal limits  LIPASE, BLOOD - Abnormal; Notable for the following:    Lipase 69 (*)    All other components within normal limits  PROTIME-INR - Abnormal; Notable for the following:    Prothrombin Time 46.1 (*)    INR 4.96 (*)    All other components within normal limits  OCCULT BLOOD X 1 CARD TO LAB, STOOL - Abnormal; Notable for the following:    Fecal Occult Bld POSITIVE (*)    All other components within normal limits  CLOSTRIDIUM DIFFICILE BY PCR  MRSA PCR SCREENING  CBC WITH DIFFERENTIAL  I-STAT CG4 LACTIC ACID, ED    Imaging Review Ct Abdomen Pelvis W Contrast  08/02/2014   CLINICAL DATA:  Diarrhea. History of breast cancer and left mastectomy.  EXAM: CT ABDOMEN AND PELVIS WITH CONTRAST  TECHNIQUE: Multidetector CT imaging of the abdomen and pelvis was performed using the standard protocol following bolus administration of intravenous contrast.  CONTRAST:  35mL OMNIPAQUE IOHEXOL 300 MG/ML SOLN, 31mL OMNIPAQUE IOHEXOL 300 MG/ML SOLN  COMPARISON:  07/24/2014  FINDINGS: Again noted is a large hiatal hernia. Dependent atelectasis at the lung bases. No evidence for free intraperitoneal air.  There are cardiac pacer leads. Subtle density within the gallbladder could represent sludge. No evidence for  gallbladder inflammation. Normal appearance of the liver and portal venous system. There is fatty  infiltration of the pancreas. Again noted are multiple subtle low density structures throughout the spleen. These low-density structure are less conspicuous on the delayed images. Normal appearance of the adrenal glands. Normal appearance of the kidneys with right extrarenal pelvis. No evidence for hydronephrosis. There is focal dilatation or a bulge in the infrarenal abdominal aorta along the posterior aortic wall. Overall size of the abdominal aorta at the level measures 2.7 cm. This focal bulge is unchanged from a spine CT on 06/17/2011. Otherwise, the abdominal aorta is heavily calcified and there are calcifications involving the visceral arteries.  Uterus has been removed. Extensive diverticulosis in the sigmoid colon. There is a left inguinal hernia which contains a small portion of the colon at the junction of the sigmoid colon and descending colon. No evidence for inflammation in this left inguinal hernia. Diverticulosis involving the descending colon. No acute abnormality in the small bowel. A right posterior abdominal wall hernia containing fat.  Multilevel degenerative facet disease in the lumbar spine. No acute bone abnormality.  IMPRESSION: No acute abnormalities involving the abdomen or pelvis.  Left inguinal hernia contains a small amount of colon. There is no acute inflammation in this area.  Colonic diverticulosis without acute colonic inflammation.  Small aneurysm or focal bulge along the posterior abdominal aorta. This appears unchanged since 2012.  Multiple small hypodense structures in the spleen are indeterminate. These are less conspicuous on the delayed images and could be related to hemangiomas. This may be better characterized with MRI.   Electronically Signed   By: Markus Daft M.D.   On: 08/02/2014 20:10     EKG Interpretation None      MDM   Final diagnoses:  Diarrhea  Rectal bleeding  Supratherapeutic INR   Continued diarrhea in setting of recent UTI. No vomiting or  fever  Lactate normal. Hemoccult positive. INR 4.96. Hemoglobin is stable. Abdomen is soft and nontender. UA appears improved. Patient denies any dizziness or lightheadedness currently but did have some at home. Weak with standing but orthostatics negative.  CT without acute abnormality.  Concern for occult GIB with FOBT + and elevated INR.   D/w Dr. Ernestina Patches.  He accepts patient to [facility]  Asks for vitamin K and cipro/flagyl.  Cannot send type and screen at [facility]  Remains stable at time of transfer. BP 120/52 mmHg  Pulse 61  Temp(Src) 98.2 F (36.8 C) (Oral)  Resp 19  Ht 6' (1.829 m)  Wt 220 lb 10.9 oz (100.1 kg)  BMI 29.92 kg/m2  SpO2 94%   Ezequiel Essex, MD 08/03/14 0225

## 2014-08-02 NOTE — ED Notes (Signed)
Pt c/o diarrhea with abx for UTI seen last week at [facility] DX UTI

## 2014-08-02 NOTE — ED Notes (Signed)
Called to verify patients bed status, order remains. Report given to the charge nurse of the ICU / Sd at [facility].

## 2014-08-02 NOTE — Evaluation (Signed)
EDP: [facility] Chief Complaint: GIB, diarrhea, ? UTI  Came in today for recheck on abd pain and UTI (seen in ER 5-6 days ago-placed on kelflex). Dysuria sxs mildly improved. Has had worsening abd pain and diarrhea. On coumadin for afib, DVT.  + hemoccult. INR 4.96. Weak with standing but orthostatics negative. CT abd- no acute findings. Chronic inguinal hernia, diverticulosis, post abd aortic aneurysm-stable, ? Stable splenic hemangiomas. Wanting admission for abd, diarrhea, weakness, GIB. Asked to start pt on cipro flagyl, type and screen. IV vit K x1. Stepdown @ [facility].

## 2014-08-02 NOTE — ED Notes (Signed)
Patient ambualtory to the restroom, steady gait one person minimal assistance.

## 2014-08-03 ENCOUNTER — Encounter (HOSPITAL_COMMUNITY): Payer: Self-pay | Admitting: Internal Medicine

## 2014-08-03 DIAGNOSIS — I5032 Chronic diastolic (congestive) heart failure: Secondary | ICD-10-CM | POA: Diagnosis present

## 2014-08-03 DIAGNOSIS — K922 Gastrointestinal hemorrhage, unspecified: Secondary | ICD-10-CM | POA: Diagnosis present

## 2014-08-03 DIAGNOSIS — N179 Acute kidney failure, unspecified: Secondary | ICD-10-CM | POA: Diagnosis present

## 2014-08-03 DIAGNOSIS — R197 Diarrhea, unspecified: Principal | ICD-10-CM | POA: Diagnosis present

## 2014-08-03 DIAGNOSIS — R710 Precipitous drop in hematocrit: Secondary | ICD-10-CM | POA: Diagnosis present

## 2014-08-03 DIAGNOSIS — I4891 Unspecified atrial fibrillation: Secondary | ICD-10-CM | POA: Diagnosis present

## 2014-08-03 DIAGNOSIS — R748 Abnormal levels of other serum enzymes: Secondary | ICD-10-CM | POA: Diagnosis present

## 2014-08-03 DIAGNOSIS — E876 Hypokalemia: Secondary | ICD-10-CM | POA: Diagnosis present

## 2014-08-03 DIAGNOSIS — R791 Abnormal coagulation profile: Secondary | ICD-10-CM

## 2014-08-03 DIAGNOSIS — I481 Persistent atrial fibrillation: Secondary | ICD-10-CM | POA: Diagnosis not present

## 2014-08-03 DIAGNOSIS — K625 Hemorrhage of anus and rectum: Secondary | ICD-10-CM

## 2014-08-03 DIAGNOSIS — E119 Type 2 diabetes mellitus without complications: Secondary | ICD-10-CM | POA: Diagnosis present

## 2014-08-03 DIAGNOSIS — I10 Essential (primary) hypertension: Secondary | ICD-10-CM

## 2014-08-03 DIAGNOSIS — Z87891 Personal history of nicotine dependence: Secondary | ICD-10-CM | POA: Diagnosis not present

## 2014-08-03 DIAGNOSIS — Z86711 Personal history of pulmonary embolism: Secondary | ICD-10-CM | POA: Diagnosis not present

## 2014-08-03 DIAGNOSIS — Z87440 Personal history of urinary (tract) infections: Secondary | ICD-10-CM | POA: Diagnosis not present

## 2014-08-03 DIAGNOSIS — I340 Nonrheumatic mitral (valve) insufficiency: Secondary | ICD-10-CM | POA: Diagnosis present

## 2014-08-03 DIAGNOSIS — Z885 Allergy status to narcotic agent status: Secondary | ICD-10-CM | POA: Diagnosis not present

## 2014-08-03 DIAGNOSIS — Z7901 Long term (current) use of anticoagulants: Secondary | ICD-10-CM

## 2014-08-03 DIAGNOSIS — Z950 Presence of cardiac pacemaker: Secondary | ICD-10-CM | POA: Diagnosis not present

## 2014-08-03 DIAGNOSIS — I2510 Atherosclerotic heart disease of native coronary artery without angina pectoris: Secondary | ICD-10-CM | POA: Diagnosis present

## 2014-08-03 DIAGNOSIS — I872 Venous insufficiency (chronic) (peripheral): Secondary | ICD-10-CM | POA: Diagnosis present

## 2014-08-03 DIAGNOSIS — I272 Other secondary pulmonary hypertension: Secondary | ICD-10-CM | POA: Diagnosis present

## 2014-08-03 DIAGNOSIS — I495 Sick sinus syndrome: Secondary | ICD-10-CM | POA: Diagnosis present

## 2014-08-03 DIAGNOSIS — Z853 Personal history of malignant neoplasm of breast: Secondary | ICD-10-CM | POA: Diagnosis not present

## 2014-08-03 LAB — PRO B NATRIURETIC PEPTIDE: PRO B NATRI PEPTIDE: 915.2 pg/mL — AB (ref 0–450)

## 2014-08-03 LAB — BASIC METABOLIC PANEL
ANION GAP: 13 (ref 5–15)
BUN: 11 mg/dL (ref 6–23)
CHLORIDE: 104 meq/L (ref 96–112)
CO2: 24 mEq/L (ref 19–32)
Calcium: 8.5 mg/dL (ref 8.4–10.5)
Creatinine, Ser: 1.01 mg/dL (ref 0.50–1.10)
GFR calc Af Amer: 59 mL/min — ABNORMAL LOW (ref >90)
GFR calc non Af Amer: 51 mL/min — ABNORMAL LOW (ref >90)
Glucose, Bld: 132 mg/dL — ABNORMAL HIGH (ref 70–99)
Potassium: 3.3 mEq/L — ABNORMAL LOW (ref 3.7–5.3)
SODIUM: 141 meq/L (ref 137–147)

## 2014-08-03 LAB — PROTIME-INR
INR: 5.49 (ref 0.00–1.49)
PROTHROMBIN TIME: 50.3 s — AB (ref 11.6–15.2)

## 2014-08-03 LAB — GLUCOSE, CAPILLARY
GLUCOSE-CAPILLARY: 98 mg/dL (ref 70–99)
Glucose-Capillary: 117 mg/dL — ABNORMAL HIGH (ref 70–99)
Glucose-Capillary: 119 mg/dL — ABNORMAL HIGH (ref 70–99)
Glucose-Capillary: 198 mg/dL — ABNORMAL HIGH (ref 70–99)

## 2014-08-03 LAB — CBC
HCT: 31.7 % — ABNORMAL LOW (ref 36.0–46.0)
Hemoglobin: 10.4 g/dL — ABNORMAL LOW (ref 12.0–15.0)
MCH: 29.8 pg (ref 26.0–34.0)
MCHC: 32.8 g/dL (ref 30.0–36.0)
MCV: 90.8 fL (ref 78.0–100.0)
PLATELETS: 237 10*3/uL (ref 150–400)
RBC: 3.49 MIL/uL — ABNORMAL LOW (ref 3.87–5.11)
RDW: 14.9 % (ref 11.5–15.5)
WBC: 3.5 10*3/uL — AB (ref 4.0–10.5)

## 2014-08-03 LAB — MRSA PCR SCREENING: MRSA BY PCR: NEGATIVE

## 2014-08-03 LAB — HEMOGLOBIN A1C
Hgb A1c MFr Bld: 6.2 % — ABNORMAL HIGH (ref <5.7)
Mean Plasma Glucose: 131 mg/dL — ABNORMAL HIGH (ref <117)

## 2014-08-03 MED ORDER — BETHANECHOL CHLORIDE 25 MG PO TABS
25.0000 mg | ORAL_TABLET | Freq: Every day | ORAL | Status: DC
  Administered 2014-08-03 – 2014-08-05 (×3): 25 mg via ORAL
  Filled 2014-08-03 (×3): qty 1

## 2014-08-03 MED ORDER — FLECAINIDE ACETATE 100 MG PO TABS
100.0000 mg | ORAL_TABLET | Freq: Two times a day (BID) | ORAL | Status: DC
  Administered 2014-08-03 – 2014-08-05 (×5): 100 mg via ORAL
  Filled 2014-08-03 (×7): qty 1

## 2014-08-03 MED ORDER — CYANOCOBALAMIN 500 MCG PO TABS
500.0000 ug | ORAL_TABLET | Freq: Every day | ORAL | Status: DC
  Administered 2014-08-03 – 2014-08-05 (×3): 500 ug via ORAL
  Filled 2014-08-03 (×3): qty 1

## 2014-08-03 MED ORDER — SODIUM CHLORIDE 0.9 % IJ SOLN: 3.0000 mL | INTRAMUSCULAR | Status: DC | PRN

## 2014-08-03 MED ORDER — ALLOPURINOL 150 MG HALF TABLET
150.0000 mg | ORAL_TABLET | Freq: Every day | ORAL | Status: DC | PRN
  Filled 2014-08-03: qty 1

## 2014-08-03 MED ORDER — METOPROLOL SUCCINATE ER 50 MG PO TB24
50.0000 mg | ORAL_TABLET | Freq: Every day | ORAL | Status: DC
  Administered 2014-08-03 – 2014-08-05 (×3): 50 mg via ORAL
  Filled 2014-08-03 (×2): qty 1
  Filled 2014-08-03: qty 2

## 2014-08-03 MED ORDER — SODIUM CHLORIDE 0.9 % IV SOLN: 250.0000 mL | INTRAVENOUS | Status: DC | PRN

## 2014-08-03 MED ORDER — OXYCODONE-ACETAMINOPHEN 5-325 MG PO TABS: 1.0000 | ORAL_TABLET | ORAL | Status: DC | PRN

## 2014-08-03 MED ORDER — PHYTONADIONE 1 MG/0.5 ML ORAL SOLUTION
1.0000 mg | Freq: Once | ORAL | Status: AC
  Administered 2014-08-03: 1 mg via ORAL
  Filled 2014-08-03: qty 0.5

## 2014-08-03 MED ORDER — SODIUM CHLORIDE 0.9 % IJ SOLN
3.0000 mL | Freq: Two times a day (BID) | INTRAMUSCULAR | Status: DC
  Administered 2014-08-03 – 2014-08-05 (×4): 3 mL via INTRAVENOUS

## 2014-08-03 MED ORDER — METRONIDAZOLE IN NACL 5-0.79 MG/ML-% IV SOLN
500.0000 mg | Freq: Three times a day (TID) | INTRAVENOUS | Status: DC
  Administered 2014-08-03 – 2014-08-04 (×5): 500 mg via INTRAVENOUS
  Filled 2014-08-03 (×6): qty 100

## 2014-08-03 MED ORDER — POTASSIUM CHLORIDE CRYS ER 20 MEQ PO TBCR
20.0000 meq | EXTENDED_RELEASE_TABLET | Freq: Three times a day (TID) | ORAL | Status: DC
  Administered 2014-08-03 – 2014-08-05 (×7): 20 meq via ORAL
  Filled 2014-08-03 (×9): qty 1

## 2014-08-03 MED ORDER — POTASSIUM CHLORIDE CRYS ER 20 MEQ PO TBCR
40.0000 meq | EXTENDED_RELEASE_TABLET | Freq: Once | ORAL | Status: AC
Start: 2014-08-03 — End: 2014-08-03
  Administered 2014-08-03: 40 meq via ORAL
  Filled 2014-08-03: qty 2

## 2014-08-03 MED ORDER — CIPROFLOXACIN IN D5W 400 MG/200ML IV SOLN
400.0000 mg | Freq: Two times a day (BID) | INTRAVENOUS | Status: DC
  Administered 2014-08-03 – 2014-08-04 (×3): 400 mg via INTRAVENOUS
  Filled 2014-08-03 (×4): qty 200

## 2014-08-03 MED ORDER — ONDANSETRON HCL 4 MG/2ML IJ SOLN: 4.0000 mg | Freq: Three times a day (TID) | INTRAMUSCULAR | Status: DC | PRN

## 2014-08-03 MED ORDER — VITAMIN B-6 100 MG PO TABS
200.0000 mg | ORAL_TABLET | Freq: Every day | ORAL | Status: DC
  Administered 2014-08-03 – 2014-08-05 (×3): 200 mg via ORAL
  Filled 2014-08-03 (×3): qty 2

## 2014-08-03 MED ORDER — SODIUM CHLORIDE 0.9 % IJ SOLN: 3.0000 mL | Freq: Two times a day (BID) | INTRAMUSCULAR | Status: DC

## 2014-08-03 MED ORDER — ANASTROZOLE 1 MG PO TABS
1.0000 mg | ORAL_TABLET | Freq: Every day | ORAL | Status: DC
  Administered 2014-08-03 – 2014-08-05 (×3): 1 mg via ORAL
  Filled 2014-08-03 (×3): qty 1

## 2014-08-03 MED ORDER — INSULIN ASPART 100 UNIT/ML ~~LOC~~ SOLN
0.0000 [IU] | Freq: Three times a day (TID) | SUBCUTANEOUS | Status: DC
  Administered 2014-08-04 – 2014-08-05 (×3): 1 [IU] via SUBCUTANEOUS

## 2014-08-03 NOTE — Progress Notes (Signed)
CRITICAL VALUE ALERT  Critical value received:  INR 5.49  Date of notification:  08/03/14  Time of notification:  0755   Critical value read back:Yes.    Nurse who received alert:  Ckeatts,rn   MD notified (1st page):  Rama  Time of first page:  0800  MD notified (2nd page):  Time of second page:  Responding MD:  Rama  Time MD responded:

## 2014-08-03 NOTE — Plan of Care (Signed)
Problem: Phase I Progression Outcomes Goal: Hemodynamically stable Outcome: Completed/Met Date Met:  08/03/14     

## 2014-08-03 NOTE — Plan of Care (Signed)
Problem: Phase I Progression Outcomes Goal: Voiding-avoid urinary catheter unless indicated Outcome: Completed/Met Date Met:  08/03/14     

## 2014-08-03 NOTE — H&P (Signed)
[facility] History and Physical  [name] [name] [id number] DOB: 03-06-1931 DOA: 08/02/2014  Referring physician: ED physician PCP: Foye Spurling, MD  Specialists:   Chief Complaint: diarrhea  HPI: [name] [name] is a 80 y.o. female past medical history of pacemaker placement, A. Fib on Coumadin, diastolic congestive heart failure, hypertension, diabetes type 2, who presents with the diarrhea.  Patient reports that she visited the emergency room because of lower abdominal pain on 07/24/14. She was diagnosed as UTI and started with Keflex. 2 days after she took antibiotics, she started having watery diarrhea, therefore she stopped taking Keflex. Her diarrhea did not get any better, and actually has been progressively getting worse. She has approximately 5-10 times of bowel movements each day. She did not notice blood in her stool. She has mild nausea, but no significant abdominal pain. She does not have fever, chills, chest pain, shortness of breath, cough. Denies any recent travel history or sick contacts. No syncope or near syncope. No dysuria or hematuria.  In [facility] ED, she was found to have positive FOBT, INR 4.96, Hgb 12.9, CT-abd has no acute abnormality. Due to concerning for occult GI bleeding, she is transferred to [facility] hospital for further evaluation and treatment. She was started with vitamin K and cipro/flagyl before transfer.   Review of Systems: As presented in the history of presenting illness, rest negative.  Where does patient live?  Lives alone at home Can patient participate in ADLs? Yes  Allergy:  Allergies  Allergen Reactions  . Morphine And Related Other (See Comments)    Family requests no morphine due to past reaction    Past Medical History  Diagnosis Date  . Hypertension   . Diabetes mellitus   . Tachy-brady syndrome     s/p pacer 07/2009  . Atrial fibrillation     flecainide; coumadin  . Atrial flutter     November, 2010, flecainide, Coumadin   . Chronic diastolic heart failure     echo 10/10: mild LVH, EF 55-60%, mild AI, mild MR, mild to mod LAE, mild RVE, severe RAE, mod TR, PASP 53;  Echo 3/14:  Mild LVH, EF 55%, Tr AI, MAC, mild MR, mod LAE, PASP 31, trivial eff.  . Shortness of breath     myoview 11/10: EF 65%, no ischemia  . Pulmonary HTN     53 mmHg echo, 2010, moderate TR, mild right ventricular enlargement  . DVT (deep venous thrombosis)     Pulmonary embolus  . Pulmonary embolus 2006    2006, with DVT  . Carotid artery disease     [id number]: RICA [id number], LICA [id number]  . Thyroid nodule     non-neoplastic goiter  . Breast cancer   . Ejection fraction     EF 60%, echo, October, 2010  . Warfarin anticoagulation     Atrial fibrillation  . Pacemaker     November, 2010, Dr Rayann Heman - MDT ADDRL1 [place] PPM ser #: [id number]    . S/P laminectomy     June 22, 2011  . Syncope     2010, treated with pacemaker  . Drug therapy     FLECAINIDE  . Mitral regurgitation     mild, echo, October, 2010  . Mediastinal lymphadenopathy   . Venous insufficiency     Chronic  . Hypokalemia     October, 2012, potassium dose increased  . Hiatal hernia 2006    EGD  . Acute gastric ulcer without mention of hemorrhage, perforation, or obstruction  2006    EGD  . Esophagitis, unspecified 2006    EGD  . Allergy     seasonal  . Diverticulosis   . Hx of adenomatous colonic polyps   . Fecal incontinence   . Breast cancer   . Osteopenia     Past Surgical History  Procedure Laterality Date  . Breast surgery      Left  . Abdominal hysterectomy    . Back surgery      1973, 2012   . Ankle surgery      Social History:  reports that she quit smoking about 15 years ago. She has never used smokeless tobacco. She reports that she does not drink alcohol or use illicit drugs.  Family History:  Family History  Problem Relation Age of Onset  . Heart disease Mother   . COPD Father   . Colon cancer Neg Hx      Prior to Admission  medications   Medication Sig Start Date End Date Taking? Authorizing Provider  anastrozole (ARIMIDEX) 1 MG tablet Take 1 tablet (1 mg total) by mouth daily. 07/28/14  Yes Heath Lark, MD  bethanechol (URECHOLINE) 25 MG tablet Take 25 mg by mouth daily.   Yes Historical Provider, MD  dicyclomine (BENTYL) 10 MG capsule Take 10 mg by mouth 2 (two) times daily.   Yes Historical Provider, MD  flecainide (TAMBOCOR) 100 MG tablet Take 100 mg by mouth 2 (two) times daily.   Yes Historical Provider, MD  furosemide (LASIX) 40 MG tablet Take 40 mg by mouth 2 (two) times daily.   Yes Historical Provider, MD  metFORMIN (GLUCOPHAGE) 500 MG tablet Take 750 mg by mouth 2 (two) times daily with a meal.   Yes Historical Provider, MD  metoprolol succinate (TOPROL-XL) 25 MG 24 hr tablet Take 25 mg by mouth daily.   Yes Historical Provider, MD  oxyCODONE-acetaminophen (PERCOCET/ROXICET) 5-325 MG per tablet Take 1-2 tablets by mouth every 4 (four) hours as needed for severe pain. 07/24/14  Yes Leota Jacobsen, MD  polyethylene glycol [facility]) packet Take 17 g by mouth daily. Patient taking differently: Take 17 g by mouth daily as needed (for constipation).  09/19/13  Yes Dot Lanes, MD  potassium chloride 20 MEQ/15ML (10%) solution Take 20 mEq by mouth daily.    Yes Historical Provider, MD  pyridoxine (B-6) 100 MG tablet Take 200 mg by mouth daily.    Yes Historical Provider, MD  vitamin B-12 (CYANOCOBALAMIN) 500 MCG tablet Take 500 mcg by mouth daily.    Yes Historical Provider, MD  warfarin (COUMADIN) 5 MG tablet Take 1 tablet (5 mg total) by mouth one time only at 6 PM. Patient taking differently: Take 2.5-5 mg by mouth as directed. Sunday and Thursday 5 mg and all other days 2.5 mg 06/08/14  Yes Carlena Bjornstad, MD  allopurinol (ZYLOPRIM) 300 MG tablet Take 150 mg by mouth daily as needed (pain in fingers/toes.).  07/04/14   Historical Provider, MD  cephALEXin (KEFLEX) 500 MG capsule Take 1 capsule (500 mg total)  by mouth 4 (four) times daily. 07/24/14   Leota Jacobsen, MD  metoprolol succinate (TOPROL-XL) 25 MG 24 hr tablet TAKE 2 TABLETS (50 MG TOTAL) BY MOUTH DAILY. 07/26/14   Carlena Bjornstad, MD  promethazine (PHENERGAN) 25 MG tablet Take 1 tablet (25 mg total) by mouth every 8 (eight) hours as needed for nausea or vomiting. 09/02/13   Brent General, PA-C    Physical Exam:  Filed Vitals:   08/02/14 2259 08/03/14 0005 08/03/14 0006 08/03/14 0109  BP: 117/58 128/63 128/63 120/52  Pulse: 63  65 61  Temp: 97.7 F (36.5 C)  98.2 F (36.8 C)   TempSrc: Oral  Oral   Resp: 18  13 19   Height:   6' (1.829 m)   Weight:   100.1 kg (220 lb 10.9 oz)   SpO2: 95%  100% 94%   General: Not in acute distress HEENT:       Eyes: PERRL, EOMI, no scleral icterus       ENT: No discharge from the ears and nose, no pharynx injection, no tonsillar enlargement.        Neck: No JVD, no bruit, no mass felt. Cardiac: S1/S2, RRR, No murmurs, gallops or rubs Pulm: Good air movement bilaterally. Clear to auscultation bilaterally. No rales, wheezing, rhonchi or rubs. Abd: Soft, nondistended, nontender, no rebound pain, no organomegaly, BS present Ext: No edema. 2+DP/PT pulse bilaterally Musculoskeletal: No joint deformities, erythema, or stiffness, ROM full Skin: No rashes.  Neuro: Alert and oriented X3, cranial nerves II-XII grossly intact, muscle strength 5/5 in all extremeties, sensation to light touch intact.  Psych: Patient is not psychotic, no suicidal or hemocidal ideation.  Labs on Admission:  Basic Metabolic Panel:  Recent Labs Lab 08/02/14 1715  NA 140  K 3.4*  CL 101  CO2 25  GLUCOSE 115*  BUN 15  CREATININE 1.20*  CALCIUM 9.2   Liver Function Tests:  Recent Labs Lab 08/02/14 1715  AST 22  ALT 16  ALKPHOS 99  BILITOT 0.7  PROT 8.0  ALBUMIN 3.8    Recent Labs Lab 08/02/14 1715  LIPASE 69*   No results for input(s): AMMONIA in the last 168 hours. CBC:  Recent Labs Lab  08/02/14 1715  WBC 5.6  NEUTROABS 3.9  HGB 12.9  HCT 38.7  MCV 91.5  PLT 298   Cardiac Enzymes: No results for input(s): CKTOTAL, CKMB, CKMBINDEX, TROPONINI in the last 168 hours.  BNP (last 3 results) No results for input(s): PROBNP in the last 8760 hours. CBG: No results for input(s): GLUCAP in the last 168 hours.  Radiological Exams on Admission: Ct Abdomen Pelvis W Contrast  08/02/2014   CLINICAL DATA:  Diarrhea. History of breast cancer and left mastectomy.  EXAM: CT ABDOMEN AND PELVIS WITH CONTRAST  TECHNIQUE: Multidetector CT imaging of the abdomen and pelvis was performed using the standard protocol following bolus administration of intravenous contrast.  CONTRAST:  52mL OMNIPAQUE IOHEXOL 300 MG/ML SOLN, 4mL OMNIPAQUE IOHEXOL 300 MG/ML SOLN  COMPARISON:  07/24/2014  FINDINGS: Again noted is a large hiatal hernia. Dependent atelectasis at the lung bases. No evidence for free intraperitoneal air.  There are cardiac pacer leads. Subtle density within the gallbladder could represent sludge. No evidence for gallbladder inflammation. Normal appearance of the liver and portal venous system. There is fatty infiltration of the pancreas. Again noted are multiple subtle low density structures throughout the spleen. These low-density structure are less conspicuous on the delayed images. Normal appearance of the adrenal glands. Normal appearance of the kidneys with right extrarenal pelvis. No evidence for hydronephrosis. There is focal dilatation or a bulge in the infrarenal abdominal aorta along the posterior aortic wall. Overall size of the abdominal aorta at the level measures 2.7 cm. This focal bulge is unchanged from a spine CT on 06/17/2011. Otherwise, the abdominal aorta is heavily calcified and there are calcifications involving the visceral arteries.  Uterus has  been removed. Extensive diverticulosis in the sigmoid colon. There is a left inguinal hernia which contains a small portion of the  colon at the junction of the sigmoid colon and descending colon. No evidence for inflammation in this left inguinal hernia. Diverticulosis involving the descending colon. No acute abnormality in the small bowel. A right posterior abdominal wall hernia containing fat.  Multilevel degenerative facet disease in the lumbar spine. No acute bone abnormality.  IMPRESSION: No acute abnormalities involving the abdomen or pelvis.  Left inguinal hernia contains a small amount of colon. There is no acute inflammation in this area.  Colonic diverticulosis without acute colonic inflammation.  Small aneurysm or focal bulge along the posterior abdominal aorta. This appears unchanged since 2012.  Multiple small hypodense structures in the spleen are indeterminate. These are less conspicuous on the delayed images and could be related to hemangiomas. This may be better characterized with MRI.   Electronically Signed   By: Markus Daft M.D.   On: 08/02/2014 20:10    EKG: Independently reviewed.   Assessment/Plan Principal Problem:   Diarrhea Active Problems:   Atrial fibrillation   Chronic diastolic heart failure   Warfarin anticoagulation   Hypokalemia   Hypertension   Sick sinus syndrome   Supratherapeutic INR  Diarrhea: Etiology is not clear. Given her recent history of antibiotic use, it is important to rule out C. Difficile. Currently patient is hemodynamically stable. Electrolyte is okay except for slightly low potassium (3.4 on admission). - patient was accepted to SDU - hold lasix  - check C. Difficile PCR and GI pathogen panel - cipro/flagyl was started in ED, will continue. If c diff negative, will d/c  A fib:  on coumadin at home. INR=4.96. Due to positive FOBT, 5 mg of Vk was given by ED.  - will hold coumadin tonight - may resume in AM, if Hgb is stable.  - continue flecainide and metoprolol  diastolic dysfunction: 2-D echo on 12/13/12 showed EF of 55%. Patient is euvolemic on admission. she is on  Lasix at home. - We'll hold Lasix in the setting of severe diarrhea. - Check proBNP in the morning  HTN: metoprolol  DM-II: on metformin at home - switch to SSI - A1c   Hypokalemia: Potassium 3.4 on admission -Repleted, 40 mEq of KCl   DVT ppx: scd  Code Status: Full code. I discussed with patient about code status, she is not ready to make decision now. Will be full code now.   Family Communication: None at bed side.     Disposition Plan: Admit to inpatient   Date of Service 08/03/2014    Ivor Costa [facility] Pager [phone]  If 7PM-7AM, please contact night-coverage [email or web address] Password [facility] 08/03/2014, 4:39 AM

## 2014-08-03 NOTE — Plan of Care (Signed)
Problem: Consults Goal: Nutrition Consult-if indicated Outcome: Not Applicable Date Met:  65/68/12

## 2014-08-03 NOTE — Progress Notes (Signed)
Progress Note   Rikkia Kucera [id number] DOB: 10/30/1930 DOA: 08/02/2014 PCP: Foye Spurling, MD   Brief Narrative:   Dayany Tio is an 80 y.o. female with a PMH of atrial fibrillation status post pacemaker on chronic Coumadin, chronic diastolic CHF, hypertension, type 2 diabetes who was admitted 08/02/14 with chief complaint of diarrhea.  Of note, the patient was evaluated in the ED 07/24/14 for evaluation of abdominal pain, was diagnosed with a UTI and started on Keflex.  In the ED, the patient was found to be FOBT+. Hemoglobin was stable at 12.9. CT scan of the abdomen was unremarkable. INR was supratherapeutic at 4.96.  Assessment/Plan:   Principal Problem:   Diarrhea  Worrisome for Clostridium difficile given recent antibiotic use. GI pathogen panel/C. Difficile studies sent.  Hold Lasix.  Continue empiric Cipro/Flagyl for now.  CT scan of the abdomen, done on admission, was unremarkable.  Active Problems:   Atrial fibrillation / supratherapeutic INR / warfarin anticoagulation / FOBT+ / GI bleed  Coumadin held on admission secondary to supratherapeutic INR.  2.5 g drop in hemoglobin noted overnight, which may be in part dilutional.  Given 5 mg of vitamin K in the ED on admission.  Recheck INR. Monitor hemoglobin closely.  Continue flecainide and metoprolol.    Elevated lipase  Monitor for signs of developing pancreatitis.    Type 2 diabetes  Metformin on hold. Currently on insulin sensitive SSI. CBG 117.    Acute kidney injury   Likely prerenal, improving with hydration. Continue to hold Lasix.     Chronic diastolic heart failure  2-D echo done 12/13/12, EF 55%.  Lasix currently on hold.  Monitor I/O closely.    Hypokalemia  Likely from GI losses secondary to diarrhea and renal losses from prior Lasix use.  Placed on oral supplementation.    Hypertension  Continue metoprolol.    Sick sinus syndrome  The patient is status post  pacemaker.    DVT Prophylaxis  Code Status: Full. Family Communication: No family at the bedside. Disposition Plan: Home when stable.   IV Access:    Peripheral IV   Procedures and diagnostic studies:   Ct Abdomen Pelvis W Contrast 08/02/2014: No acute abnormalities involving the abdomen or pelvis.  Left inguinal hernia contains a small amount of colon. There is no acute inflammation in this area.  Colonic diverticulosis without acute colonic inflammation.  Small aneurysm or focal bulge along the posterior abdominal aorta. This appears unchanged since 2012.  Multiple small hypodense structures in the spleen are indeterminate. These are less conspicuous on the delayed images and could be related to hemangiomas. This may be better characterized with MRI.     Medical Consultants:    None.  Anti-Infectives:    Cipro 08/02/14--->  Flagyl 08/02/14--->  Subjective:   Arali Gabrielle denies any N/V and abdominal pain.  She had lower abdominal pain a few days ago, but none for the past 3 days.  No further loose stools.  Objective:    Filed Vitals:   08/03/14 0200 08/03/14 0400 08/03/14 0600 08/03/14 0646  BP: 130/49 118/55 93/58 114/98  Pulse: 60 65 59 64  Temp:  98.2 F (36.8 C)    TempSrc:  Oral    Resp: 17 18 17 12   Height:      Weight:      SpO2: 93% 93% 97% 95%    Intake/Output Summary (Last 24 hours) at 08/03/14 0854 Last data filed at 08/03/14 [id number]  Gross  per 24 hour  Intake    700 ml  Output      0 ml  Net    700 ml    Exam: Gen:  NAD Cardiovascular:  RRR, grade II/VI systolic murmur Respiratory:  Lungs CTAB Gastrointestinal:  Abdomen soft, NT/ND, + BS Extremities:  No C/E/C   Data Reviewed:    Labs: Basic Metabolic Panel:  Recent Labs Lab 08/02/14 1715 08/03/14 0640  NA 140 141  K 3.4* 3.3*  CL 101 104  CO2 25 24  GLUCOSE 115* 132*  BUN 15 11  CREATININE 1.20* 1.01  CALCIUM 9.2 8.5   GFR Estimated Creatinine Clearance: 57.9 mL/min  (by C-G formula based on Cr of 1.01). Liver Function Tests:  Recent Labs Lab 08/02/14 1715  AST 22  ALT 16  ALKPHOS 99  BILITOT 0.7  PROT 8.0  ALBUMIN 3.8    Recent Labs Lab 08/02/14 1715  LIPASE 69*   Coagulation profile  Recent Labs Lab 08/02/14 1710 08/03/14 0640  INR 4.96* 5.49*    CBC:  Recent Labs Lab 08/02/14 1715 08/03/14 0640  WBC 5.6 3.5*  NEUTROABS 3.9  --   HGB 12.9 10.4*  HCT 38.7 31.7*  MCV 91.5 90.8  PLT 298 237   BNP (last 3 results)  Recent Labs  08/03/14 0640  PROBNP 915.2*   CBG:  Recent Labs Lab 08/03/14 0730  GLUCAP 117*   Hgb A1c: No results for input(s): HGBA1C in the last 72 hours. Sepsis Labs:  Recent Labs Lab 08/02/14 1715 08/02/14 1731 08/03/14 0640  WBC 5.6  --  3.5*  LATICACIDVEN  --  1.48  --    Microbiology Recent Results (from the past 240 hour(s))  Urine culture     Status: None   Collection Time: 07/24/14 10:40 AM  Result Value Ref Range Status   Specimen Description URINE, CLEAN CATCH  Final   Special Requests NONE  Final   Culture  Setup Time   Final    07/24/2014 14:05 Performed at [place]   Final    >=100,000 COLONIES/ML Performed at [facility]   Culture   Final    Multiple bacterial morphotypes present, none predominant. Suggest appropriate recollection if clinically indicated. Performed at [facility]   Report Status 07/25/2014 FINAL  Final  MRSA PCR Screening     Status: None   Collection Time: 08/03/14 12:10 AM  Result Value Ref Range Status   MRSA by PCR NEGATIVE NEGATIVE Final    Comment:        The GeneXpert MRSA Assay (FDA approved for NASAL specimens only), is one component of a comprehensive MRSA colonization surveillance program. It is not intended to diagnose MRSA infection nor to guide or monitor treatment for MRSA infections.      Medications:   . anastrozole  1 mg Oral Daily  . bethanechol  25 mg Oral Daily  .  ciprofloxacin  400 mg Intravenous BID  . cyanocobalamin  500 mcg Oral Daily  . flecainide  100 mg Oral BID  . insulin aspart  0-9 Units Subcutaneous TID WC  . metoprolol succinate  50 mg Oral Daily  . metronidazole  500 mg Intravenous Q8H  . phytonadione  1 mg Oral Once  . potassium chloride  20 mEq Oral TID  . pyridoxine  200 mg Oral Daily  . sodium chloride  3 mL Intravenous Q12H  . sodium chloride  3 mL Intravenous Q12H  Continuous Infusions:   Time spent: 35 minutes with > 50% of time discussing current diagnostic test results, clinical impression and plan of care.    LOS: 1 day   Verlia Kaney  [facility] Pager [phone]. If unable to reach me by pager, please call my cell phone at [phone].  *Please refer to [email or web address], password [email or web address] to get updated schedule on who will round on this patient, as hospitalists switch teams weekly. If 7PM-7AM, please contact night-coverage at [email or web address], password [email or web address] for any overnight needs.  08/03/2014, 8:54 AM

## 2014-08-03 NOTE — Plan of Care (Signed)
Problem: Phase I Progression Outcomes Goal: Pain controlled with appropriate interventions Outcome: Not Applicable Date Met:  88/45/73

## 2014-08-04 LAB — BASIC METABOLIC PANEL
ANION GAP: 14 (ref 5–15)
BUN: 10 mg/dL (ref 6–23)
CALCIUM: 9.4 mg/dL (ref 8.4–10.5)
CHLORIDE: 104 meq/L (ref 96–112)
CO2: 22 meq/L (ref 19–32)
Creatinine, Ser: 0.92 mg/dL (ref 0.50–1.10)
GFR calc non Af Amer: 57 mL/min — ABNORMAL LOW (ref >90)
GFR, EST AFRICAN AMERICAN: 66 mL/min — AB (ref >90)
Glucose, Bld: 131 mg/dL — ABNORMAL HIGH (ref 70–99)
Potassium: 3.9 mEq/L (ref 3.7–5.3)
SODIUM: 140 meq/L (ref 137–147)

## 2014-08-04 LAB — PROTIME-INR
INR: 2.66 — AB (ref 0.00–1.49)
PROTHROMBIN TIME: 28.6 s — AB (ref 11.6–15.2)

## 2014-08-04 LAB — GLUCOSE, CAPILLARY
GLUCOSE-CAPILLARY: 123 mg/dL — AB (ref 70–99)
GLUCOSE-CAPILLARY: 91 mg/dL (ref 70–99)
Glucose-Capillary: 132 mg/dL — ABNORMAL HIGH (ref 70–99)
Glucose-Capillary: 132 mg/dL — ABNORMAL HIGH (ref 70–99)

## 2014-08-04 LAB — CBC
HCT: 33.5 % — ABNORMAL LOW (ref 36.0–46.0)
Hemoglobin: 11.2 g/dL — ABNORMAL LOW (ref 12.0–15.0)
MCH: 30.3 pg (ref 26.0–34.0)
MCHC: 33.4 g/dL (ref 30.0–36.0)
MCV: 90.5 fL (ref 78.0–100.0)
PLATELETS: 275 10*3/uL (ref 150–400)
RBC: 3.7 MIL/uL — ABNORMAL LOW (ref 3.87–5.11)
RDW: 14.8 % (ref 11.5–15.5)
WBC: 4.4 10*3/uL (ref 4.0–10.5)

## 2014-08-04 LAB — TYPE AND SCREEN
ABO/RH(D): B NEG
Antibody Screen: NEGATIVE

## 2014-08-04 LAB — ABO/RH: ABO/RH(D): B NEG

## 2014-08-04 MED ORDER — GABAPENTIN 300 MG PO CAPS
300.0000 mg | ORAL_CAPSULE | Freq: Two times a day (BID) | ORAL | Status: DC
  Administered 2014-08-04 – 2014-08-05 (×2): 300 mg via ORAL
  Filled 2014-08-04 (×3): qty 1

## 2014-08-04 MED ORDER — PREGABALIN 50 MG PO CAPS
50.0000 mg | ORAL_CAPSULE | Freq: Two times a day (BID) | ORAL | Status: DC
  Administered 2014-08-04: 50 mg via ORAL
  Filled 2014-08-04: qty 1

## 2014-08-04 MED ORDER — GABAPENTIN 600 MG PO TABS: 300.0000 mg | ORAL_TABLET | Freq: Two times a day (BID) | ORAL | Status: DC

## 2014-08-04 MED ORDER — WARFARIN - PHARMACIST DOSING INPATIENT: Freq: Every day | Status: DC

## 2014-08-04 MED ORDER — GABAPENTIN 600 MG PO TABS
300.0000 mg | ORAL_TABLET | Freq: Two times a day (BID) | ORAL | Status: DC
  Filled 2014-08-04: qty 0.5

## 2014-08-04 MED ORDER — WARFARIN SODIUM 2.5 MG PO TABS
2.5000 mg | ORAL_TABLET | Freq: Once | ORAL | Status: DC
Start: 2014-08-04 — End: 2014-08-27

## 2014-08-04 MED ORDER — WARFARIN SODIUM 2.5 MG PO TABS
2.5000 mg | ORAL_TABLET | Freq: Once | ORAL | Status: AC
  Administered 2014-08-04: 2.5 mg via ORAL
  Filled 2014-08-04 (×2): qty 1

## 2014-08-04 MED ORDER — PREGABALIN 50 MG PO CAPS: 50.0000 mg | ORAL_CAPSULE | Freq: Two times a day (BID) | ORAL | Status: DC

## 2014-08-04 NOTE — Discharge Instructions (Signed)
Anticoagulation, Generic  Anticoagulants are medicines used to prevent clots from developing in your veins. These medicine are also known as blood thinners. If blood clots are untreated, they could travel to your lungs. This is called a pulmonary embolus. A blood clot in your lungs can be fatal.   Health care providers often use anticoagulants to prevent clots following surgery. Anticoagulants are also used along with aspirin when the heart is not getting enough blood.  Another anticoagulant called warfarin is started 2 to 3 days after a rapid-acting injectable anticoagulant is started. The rapid-acting anticoagulants are usually continued until warfarin has begun to work. Your health care provider will judge this length of time by blood tests known as the prothrombin time (PT) and International Normalization Ratio (INR). This means that your blood is at the necessary and best level to prevent clots.  RISKS AND COMPLICATIONS  · If you have received recent epidural anesthesia, spinal anesthesia, or a spinal tap while receiving anticoagulants, you are at risk for developing a blood clot in or around the spine. This condition could result in long-term or permanent paralysis.  · Because anticoagulants thin your blood, severe bleeding may occur from any tissue or organ. Symptoms of the blood being too thin may include:  ¨ Bleeding from the nose or gums that does not stop quickly.  ¨ Blood in bowel movements which may appear as bright red, dark, or black tarry stools.  ¨ Blood in the urine which may appear as pink, red, or brown urine.  ¨ Unusual bruising or bruising easily.  ¨ A cut that does not stop bleeding within 10 minutes.  ¨ Vomiting blood or continuous nausea for more than 1 day.  ¨ Coughing up blood.  ¨ Broken blood vessels in your eye (subconjunctival hemorrhage).  ¨ Abdominal or back pain with or without flank bruising.  ¨ Sudden, severe headache.  ¨ Sudden weakness or numbness of the face, arm, or leg,  especially on one side of the body.  ¨ Sudden confusion.  ¨ Trouble speaking (aphasia) or understanding.  ¨ Sudden trouble seeing in one or both eyes.  ¨ Sudden trouble walking.  ¨ Dizziness.  ¨ Loss of balance or coordination.  ¨ Vaginal bleeding.  ¨ Swelling or pain at an injection site.  ¨ Superficial fat tissue death (necrosis) which may cause skin scarring. This is more common in women and may first present as pain in the waist, thighs, or buttocks.  ¨ Fever.  · Too little anticoagulation continues to allow the risk for blood clots.  HOME CARE INSTRUCTIONS   · Due to the complications of anticoagulants, it is very important that you take your anticoagulant as directed by your health care provider. Anticoagulants need to be taken exactly as instructed. Be sure you understand all your anticoagulant instructions.  · Keep all follow-up appointments with your health care provider as directed. It is very important to keep your appointments. Not keeping appointments could result in a chronic or permanent injury, pain, or disability.  · Warfarin. Your health care provider will advise you on the length of treatment (usually 3-6 months, sometimes lifelong).  ¨ Take warfarin exactly as directed by your health care provider. It is recommended that you take your warfarin dose at the same time of the day. It is preferred that you take warfarin in the late afternoon. If you have been told to stop taking warfarin, do not resume taking warfarin until directed to do so by your health care   provider. Follow your health care provider's instructions if you accidentally take an extra dose or miss a dose of warfarin. It is very important to take warfarin as directed since bleeding or blood clots could result in chronic or permanent injury, pain, or disability.  ¨ Too much and too little warfarin are both dangerous. Too much warfarin increases the risk of bleeding. Too little warfarin continues to allow the risk for blood clots. While  taking warfarin, you will need to have regular blood tests to measure your blood clotting time. These blood tests usually include both the prothrombin time (PT) and International Normalized Ratio (INR) tests. The PT and INR results allow your health care provider to adjust your dose of warfarin. The dose can change for many reasons. It is critically important that you have your PT and INR levels drawn exactly as directed. Your warfarin dose may stay the same or change depending on what the PT and INR results are. Be sure to follow up with your health care provider regarding your PT and INR test results and what your warfarin dosage should be.  ¨ Many medicines can interfere with warfarin and affect the PT and INR results. You must tell your health care provider about any and all medicines you take, this includes all vitamins and supplements. Ask your health care provider before taking these. Prescription and over-the-counter medicine consistency is critical to warfarin management. It is important that potential interactions are checked before you start a new medicine. Be especially cautious with aspirin and anti-inflammatory medicines. Ask your health care provider before taking these. Medicines such as antibiotics and acid-reducing medicine can interact with warfarin and can cause an increased warfarin effect. Warfarin can also interfere with the effectiveness of medicines you are taking. Do not take or discontinue any prescribed or over-the-counter medicine except on the advice of your health care provider or pharmacist.  ¨ Some vitamins, supplements, and herbal products interfere with the effectiveness of warfarin. Vitamin E may increase the anticoagulant effects of warfarin. Vitamin K may can cause warfarin to be less effective. Do not take or discontinue any vitamin, supplement, or herbal product except on the advice of your health care provider or pharmacist.  ¨ Eat what you normally eat and keep the vitamin K  content of your diet consistent. Avoid major changes in your diet, or notify your health care provider before changing your diet. Suddenly getting a lot more vitamin K could cause your blood to clot too quickly. A sudden decrease in vitamin K intake could cause your blood to clot too slowly. These changes in vitamin K intake could lead to dangerous blood clots or to bleeding. To keep your vitamin K intake consistent, you must be aware of which foods contain moderate or high amounts of vitamin K. Some foods high in vitamin K include spinach, kale, broccoli, cabbage, greens, Brussels sprouts, asparagus, [place], coleslaw, parsley, and green tea. Arrange a visit with a dietitian to answer your questions.  ¨ If you have a loss of appetite or get the stomach flu (viral gastroenteritis), talk to your health care provider as soon as possible. A decrease in your normal vitamin K intake can make you more sensitive to your usual dose of warfarin.  ¨ Some medical conditions may increase your risk for bleeding while you are taking warfarin. A fever, diarrhea lasting more than a day, worsening heart failure, or worsening liver function are some medical conditions that could affect warfarin. Contact your health care provider if   you have any of these medical conditions.  ¨ Alcohol can change the body's ability to handle warfarin. It is best to avoid alcoholic drinks or consume only very small amounts while taking warfarin. Notify your health care provider if you change your alcohol intake. A sudden increase in alcohol use can increase your risk of bleeding. Chronic alcohol use can cause warfarin to be less effective.  · Be careful not to cut yourself when using sharp objects or while shaving.  · Inform all your health care providers and your dentist that you take an anticoagulant.  · Limit physical activities or sports that could result in a fall or cause injury. Avoid contact sports.  · Wear medical alert jewelry or carry a  medical alert card.  SEEK IMMEDIATE MEDICAL CARE IF:  · You cough up blood.  · You have dark or black stools or there is bright red blood coming from your rectum.  · You vomit blood or have nausea for more than 1 day.  · You have blood in the urine or pink colored urine.  · You have unusual bruising or have increased bruising.  · You have bleeding from the nose or gums that does not stop quickly.  · You have a cut that does not stop bleeding within a 2-3 minutes.  · You have sudden weakness or numbness of the face, arm, or leg, especially on one side of the body.  · You have sudden confusion.  · You have trouble speaking (aphasia) or understanding.  · You have sudden trouble seeing in one or both eyes.  · You have sudden trouble walking.  · You have dizziness.  · You have a loss of balance or coordination.  · You have a sudden, severe headache.  · You have a serious fall or head injury, even if you are not bleeding.  · You have swelling or pain at an injection site.  · You have unexplained tenderness or pain in the abdomen, back, waist, thighs or buttocks.  · You have a fever.  Any of these symptoms may represent a serious problem that is an emergency. Do not wait to see if the symptoms will go away. Get medical help right away. Call your local emergency services (911 in [place] Do not drive yourself to the hospital.  Document Released: 09/16/2005 Document Revised: 09/21/2013 Document Reviewed: 04/20/2008  [employer]® Patient Information ©2015 [employer]. This information is not intended to replace advice given to you by your health care provider. Make sure you discuss any questions you have with your health care provider.

## 2014-08-04 NOTE — Discharge Summary (Addendum)
Physician Discharge Summary  Chea Woolworth [id number] DOB: 10-18-1930 DOA: 08/02/2014  PCP: Foye Spurling, MD  Admit date: 08/02/2014 Discharge date: 08/05/2014   Recommendations for Outpatient Follow-Up:    Recommend close outpatient follow-up with PT/INR as her Coumadin dose was decreased secondary to having a supratherapeutic INR on admission.   Discharge Diagnosis:   Principal Problem:    Diarrhea Active Problems:    Atrial fibrillation    Chronic diastolic heart failure    Warfarin anticoagulation    Hypokalemia    Hypertension    Sick sinus syndrome    Supratherapeutic INR    Rectal bleeding   Discharge Condition: Improved.  Diet recommendation: Low sodium, heart healthy.  Carbohydrate-modified.     History of Present Illness:   [name] [name] is an 80 y.o. female with a PMH of atrial fibrillation status post pacemaker on chronic Coumadin, chronic diastolic CHF, hypertension, type 2 diabetes who was admitted 08/02/14 with chief complaint of diarrhea. Of note, the patient was evaluated in the ED 07/24/14 for evaluation of abdominal pain, was diagnosed with a UTI and started on Keflex. In the ED, the patient was found to be FOBT+. Hemoglobin was stable at 12.9. CT scan of the abdomen was unremarkable. INR was supratherapeutic at 4.96.  Hospital Course by Problem:   Principal Problem:  Diarrhea  Self-limited. Symptoms resolved within 24 hours of admission.  Okay to resume Lasix.  iinitially treated with empiric Cipro/Flagyl, antibiotics discontinued 08/04/14.  CT scan of the abdomen, done on admission, was unremarkable.  Active Problems:  Atrial fibrillation / supratherapeutic INR / warfarin anticoagulation / FOBT+ / GI bleed  Coumadin held on admission secondary to supratherapeutic INR. No evidence of ongoing GI bleeding, resume Coumadin a 2.5 mg/day with close outpatient follow-up.  Hemoglobin stable at discharge after an initial 2.5  g drop which may have been in part, dilutional.  Given 5 mg of vitamin K in the ED on admission.  Continue flecainide and metoprolol.   Elevated lipase  No evidence of developing pancreatitis.   Type 2 diabetes  Metformin on hold. Resume at discharge.   Acute kidney injury  Likely prerenal, resolved with hydration.    Chronic diastolic heart failure  2-D echo done 12/13/12, EF 55%.  Lasix currently on hold. okay to resume at discharge.   Hypokalemia  Likely from GI losses secondary to diarrhea and renal losses from prior Lasix use.  Placed on oral supplementation.   Hypertension  Continue metoprolol.   Sick sinus syndrome  The patient is status post pacemaker.   Medical Consultants:    None.   Discharge Exam:   Filed Vitals:   08/04/14 0611  BP: 136/76  Pulse: 64  Temp: 98.1 F (36.7 C)  Resp: 18   Filed Vitals:   08/03/14 1536 08/03/14 2143 08/04/14 0500 08/04/14 0611  BP: 158/71 154/75  136/76  Pulse: 70 69  64  Temp: 97.6 F (36.4 C) 98.5 F (36.9 C)  98.1 F (36.7 C)  TempSrc: Oral Oral  Oral  Resp: 18 20  18   Height:      Weight:   103.602 kg (228 lb 6.4 oz)   SpO2: 98% 95%  98%    Gen:  NAD Cardiovascular:  RRR, No M/R/G Respiratory: Lungs CTAB Gastrointestinal: Abdomen soft, NT/ND with normal active bowel sounds. Extremities: No C/E/C   The results of significant diagnostics from this hospitalization (including imaging, microbiology, ancillary and laboratory) are listed below for reference.  Procedures and Diagnostic Studies:   Ct Abdomen Pelvis W Contrast 08/02/2014: No acute abnormalities involving the abdomen or pelvis. Left inguinal hernia contains a small amount of colon. There is no acute inflammation in this area. Colonic diverticulosis without acute colonic inflammation. Small aneurysm or focal bulge along the posterior abdominal aorta. This appears unchanged since 2012. Multiple small hypodense structures  in the spleen are indeterminate. These are less conspicuous on the delayed images and could be related to hemangiomas. This may be better characterized with MRI.    Labs:   Basic Metabolic Panel:  Recent Labs Lab 08/02/14 1715 08/03/14 0640 08/04/14 1327  NA 140 141 140  K 3.4* 3.3* 3.9  CL 101 104 104  CO2 25 24 22   GLUCOSE 115* 132* 131*  BUN 15 11 10   CREATININE 1.20* 1.01 0.92  CALCIUM 9.2 8.5 9.4   GFR Estimated Creatinine Clearance: 64.6 mL/min (by C-G formula based on Cr of 0.92). Liver Function Tests:  Recent Labs Lab 08/02/14 1715  AST 22  ALT 16  ALKPHOS 99  BILITOT 0.7  PROT 8.0  ALBUMIN 3.8    Recent Labs Lab 08/02/14 1715  LIPASE 69*   Coagulation profile  Recent Labs Lab 08/02/14 1710 08/03/14 0640 08/04/14 0445  INR 4.96* 5.49* 2.66*    CBC:  Recent Labs Lab 08/02/14 1715 08/03/14 0640 08/04/14 1327  WBC 5.6 3.5* 4.4  NEUTROABS 3.9  --   --   HGB 12.9 10.4* 11.2*  HCT 38.7 31.7* 33.5*  MCV 91.5 90.8 90.5  PLT 298 237 275   CBG:  Recent Labs Lab 08/03/14 0730 08/03/14 1145 08/03/14 1808 08/03/14 2136 08/04/14 0741  GLUCAP 117* 98 119* 198* 123*   Hgb A1c  Recent Labs  08/03/14 0640  HGBA1C 6.2*    Anemia work up No results for input(s): VITAMINB12, FOLATE, FERRITIN, TIBC, IRON, RETICCTPCT in the last 72 hours. Microbiology Recent Results (from the past 240 hour(s))  MRSA PCR Screening     Status: None   Collection Time: 08/03/14 12:10 AM  Result Value Ref Range Status   MRSA by PCR NEGATIVE NEGATIVE Final    Comment:        The GeneXpert MRSA Assay (FDA approved for NASAL specimens only), is one component of a comprehensive MRSA colonization surveillance program. It is not intended to diagnose MRSA infection nor to guide or monitor treatment for MRSA infections.      Discharge Instructions:       Discharge Instructions    Call MD for:  extreme fatigue    Complete by:  As directed      Call  MD for:  persistant dizziness or light-headedness    Complete by:  As directed      Call MD for:    Complete by:  As directed   Black or bloody stools.     Diet - low sodium heart healthy    Complete by:  As directed      Discharge instructions    Complete by:  As directed   Because your blood was very thin when you came in, we have adjusted your Coumadin dose and you will need close follow-up with your primary care doctor to check your PT/INR to make sure your levels of Coumadin remain therapeutic.  You were cared for by Dr. Jacquelynn Cree  (a hospitalist) during your hospital stay. If you have any questions about your discharge medications or the care you received while you were in the  hospital after you are discharged, you can call the unit and ask to speak with the hospitalist on call if the hospitalist that took care of you is not available. Once you are discharged, your primary care physician will handle any further medical issues. Please note that NO REFILLS for any discharge medications will be authorized once you are discharged, as it is imperative that you return to your primary care physician (or establish a relationship with a primary care physician if you do not have one) for your aftercare needs so that they can reassess your need for medications and monitor your lab values.  Any outstanding tests can be reviewed by your PCP at your follow up visit.  It is also important to review any medicine changes with your PCP.  Please bring these d/c instructions with you to your next visit so your physician can review these changes with you.  If you do not have a primary care physician, you can call [phone] for a physician referral.  It is highly recommended that you obtain a PCP for hospital follow up.     Increase activity slowly    Complete by:  As directed             Medication List    STOP taking these medications        cephALEXin 500 MG capsule  Commonly known as:  KEFLEX        TAKE these medications        allopurinol 300 MG tablet  Commonly known as:  ZYLOPRIM  Take 150 mg by mouth daily as needed (pain in fingers/toes.).     anastrozole 1 MG tablet  Commonly known as:  ARIMIDEX  Take 1 tablet (1 mg total) by mouth daily.     bethanechol 25 MG tablet  Commonly known as:  URECHOLINE  Take 25 mg by mouth daily.     dicyclomine 10 MG capsule  Commonly known as:  BENTYL  Take 10 mg by mouth 2 (two) times daily.     flecainide 100 MG tablet  Commonly known as:  TAMBOCOR  Take 100 mg by mouth 2 (two) times daily.     furosemide 40 MG tablet  Commonly known as:  LASIX  Take 40 mg by mouth 2 (two) times daily.     gabapentin 600 MG tablet  Commonly known as:  NEURONTIN  Take 0.5 tablets (300 mg total) by mouth 2 (two) times daily.     metFORMIN 500 MG tablet  Commonly known as:  GLUCOPHAGE  Take 750 mg by mouth 2 (two) times daily with a meal.     metoprolol succinate 25 MG 24 hr tablet  Commonly known as:  TOPROL-XL  TAKE 2 TABLETS (50 MG TOTAL) BY MOUTH DAILY.     oxyCODONE-acetaminophen 5-325 MG per tablet  Commonly known as:  PERCOCET/ROXICET  Take 1-2 tablets by mouth every 4 (four) hours as needed for severe pain.     polyethylene glycol packet  Commonly known as:  MIRALAX  Take 17 g by mouth daily.     potassium chloride 20 MEQ/15ML (10%) solution  Take 20 mEq by mouth daily.     promethazine 25 MG tablet  Commonly known as:  PHENERGAN  Take 1 tablet (25 mg total) by mouth every 8 (eight) hours as needed for nausea or vomiting.     pyridoxine 100 MG tablet  Commonly known as:  B-6  Take 200 mg by mouth daily.  vitamin B-12 500 MCG tablet  Commonly known as:  CYANOCOBALAMIN  Take 500 mcg by mouth daily.     warfarin 2.5 MG tablet  Commonly known as:  COUMADIN  Take 1 tablet (2.5 mg total) by mouth one time only at 6 PM.       Follow-up Information    Follow up with Foye Spurling, MD.   Specialty:  Internal Medicine    Why:  for blood work to make sure your Coumadin is therapeutic.   Contact information:   [address] [place] [address] [phone]        Time coordinating discharge: 35 minutes.  Signed:  Lael Pilch  Pager [phone] [facility] 08/04/2014, 3:21 PM

## 2014-08-04 NOTE — Progress Notes (Signed)
Progress Note   [name] [name] [id number] DOB: [birth year] DOA: 08/02/2014 PCP: Foye Spurling, MD   Brief Narrative:   [name] [name] is an 80 y.o. female with a PMH of atrial fibrillation status post pacemaker on chronic Coumadin, chronic diastolic CHF, hypertension, type 2 diabetes who was admitted 08/02/14 with chief complaint of diarrhea.  Of note, the patient was evaluated in the ED 07/24/14 for evaluation of abdominal pain, was diagnosed with a UTI and started on Keflex.  In the ED, the patient was found to be FOBT+. Hemoglobin was stable at 12.9. CT scan of the abdomen was unremarkable. INR was supratherapeutic at 4.96.  Assessment/Plan:   Principal Problem:   Diarrhea  Worrisome for Clostridium difficile given recent antibiotic use. GI pathogen panel/C. Difficile studies sent.  Hold Lasix.  Continue empiric Cipro/Flagyl for now.  CT scan of the abdomen, done on admission, was unremarkable.  Active Problems:   Atrial fibrillation / supratherapeutic INR / warfarin anticoagulation / FOBT+ / GI bleed  Coumadin held on admission secondary to supratherapeutic INR. No evidence of ongoing GI bleeding, resume Coumadin per pharmacy dosing.  2.5 g drop in hemoglobin noted overnight, which may be in part dilutional.  Given 5 mg of vitamin K in the ED on admission.  Continue flecainide and metoprolol.    Elevated lipase  Monitor for signs of developing pancreatitis.    Type 2 diabetes  Metformin on hold. Currently on insulin sensitive SSI.     Acute kidney injury   Likely prerenal, improving with hydration. Continue to hold Lasix.     Chronic diastolic heart failure  2-D echo done 12/13/12, EF 55%.  Lasix currently on hold.  Monitor I/O closely.    Hypokalemia  Likely from GI losses secondary to diarrhea and renal losses from prior Lasix use.  Placed on oral supplementation.    Hypertension  Continue metoprolol.    Sick sinus syndrome  The  patient is status post pacemaker.    DVT Prophylaxis  Code Status: Full. Family Communication: No family at the bedside. Disposition Plan: Home when stable.   IV Access:    Peripheral IV   Procedures and diagnostic studies:   Ct Abdomen Pelvis W Contrast 08/02/2014: No acute abnormalities involving the abdomen or pelvis.  Left inguinal hernia contains a small amount of colon. There is no acute inflammation in this area.  Colonic diverticulosis without acute colonic inflammation.  Small aneurysm or focal bulge along the posterior abdominal aorta. This appears unchanged since 2012.  Multiple small hypodense structures in the spleen are indeterminate. These are less conspicuous on the delayed images and could be related to hemangiomas. This may be better characterized with MRI.     Medical Consultants:    None.  Anti-Infectives:    Cipro 08/02/14--->  Flagyl 08/02/14--->  Subjective:   [name] [name] continues to feel well with no resumption of symptoms. Specifically, she has not had any nausea, vomiting or diarrhea.  Objective:    Filed Vitals:   08/03/14 1536 08/03/14 2143 08/04/14 0500 08/04/14 0611  BP: 158/71 154/75  136/76  Pulse: 70 69  64  Temp: 97.6 F (36.4 C) 98.5 F (36.9 C)  98.1 F (36.7 C)  TempSrc: Oral Oral  Oral  Resp: 18 20  18   Height:      Weight:   103.602 kg (228 lb 6.4 oz)   SpO2: 98% 95%  98%    Intake/Output Summary (Last 24 hours) at 08/04/14 1507 Last data  filed at 08/04/14 0508  Gross per 24 hour  Intake    300 ml  Output      0 ml  Net    300 ml    Exam: Gen:  NAD Cardiovascular:  RRR, grade II/VI systolic murmur Respiratory:  Lungs CTAB Gastrointestinal:  Abdomen soft, NT/ND, + BS Extremities:  No C/E/C   Data Reviewed:    Labs: Basic Metabolic Panel:  Recent Labs Lab 08/02/14 1715 08/03/14 0640 08/04/14 1327  NA 140 141 140  K 3.4* 3.3* 3.9  CL 101 104 104  CO2 25 24 22   GLUCOSE 115* 132* 131*  BUN 15 11  10   CREATININE 1.20* 1.01 0.92  CALCIUM 9.2 8.5 9.4   GFR Estimated Creatinine Clearance: 64.6 mL/min (by C-G formula based on Cr of 0.92). Liver Function Tests:  Recent Labs Lab 08/02/14 1715  AST 22  ALT 16  ALKPHOS 99  BILITOT 0.7  PROT 8.0  ALBUMIN 3.8    Recent Labs Lab 08/02/14 1715  LIPASE 69*   Coagulation profile  Recent Labs Lab 08/02/14 1710 08/03/14 0640 08/04/14 0445  INR 4.96* 5.49* 2.66*    CBC:  Recent Labs Lab 08/02/14 1715 08/03/14 0640 08/04/14 1327  WBC 5.6 3.5* 4.4  NEUTROABS 3.9  --   --   HGB 12.9 10.4* 11.2*  HCT 38.7 31.7* 33.5*  MCV 91.5 90.8 90.5  PLT 298 237 275   BNP (last 3 results)  Recent Labs  08/03/14 0640  PROBNP 915.2*   CBG:  Recent Labs Lab 08/03/14 0730 08/03/14 1145 08/03/14 1808 08/03/14 2136 08/04/14 0741  GLUCAP 117* 98 119* 198* 123*   Hgb A1c:  Recent Labs  08/03/14 0640  HGBA1C 6.2*   Sepsis Labs:  Recent Labs Lab 08/02/14 1715 08/02/14 1731 08/03/14 0640 08/04/14 1327  WBC 5.6  --  3.5* 4.4  LATICACIDVEN  --  1.48  --   --    Microbiology Recent Results (from the past 240 hour(s))  MRSA PCR Screening     Status: None   Collection Time: 08/03/14 12:10 AM  Result Value Ref Range Status   MRSA by PCR NEGATIVE NEGATIVE Final    Comment:        The GeneXpert MRSA Assay (FDA approved for NASAL specimens only), is one component of a comprehensive MRSA colonization surveillance program. It is not intended to diagnose MRSA infection nor to guide or monitor treatment for MRSA infections.      Medications:   . anastrozole  1 mg Oral Daily  . bethanechol  25 mg Oral Daily  . ciprofloxacin  400 mg Intravenous BID  . cyanocobalamin  500 mcg Oral Daily  . flecainide  100 mg Oral BID  . insulin aspart  0-9 Units Subcutaneous TID WC  . metoprolol succinate  50 mg Oral Daily  . metronidazole  500 mg Intravenous Q8H  . potassium chloride  20 mEq Oral TID  . pregabalin  50 mg  Oral BID  . pyridoxine  200 mg Oral Daily  . sodium chloride  3 mL Intravenous Q12H  . warfarin  2.5 mg Oral ONCE-1800  . Warfarin - Pharmacist Dosing Inpatient   Does not apply q1800   Continuous Infusions:   Time spent: 25 minutes.    LOS: 2 days   RAMA,CHRISTINA  [facility] Pager [phone]. If unable to reach me by pager, please call my cell phone at [phone].  *Please refer to [email or web address], password [email or web address] to get updated schedule  on who will round on this patient, as hospitalists switch teams weekly. If 7PM-7AM, please contact night-coverage at [email or web address], password [email or web address] for any overnight needs.  08/04/2014, 3:07 PM

## 2014-08-04 NOTE — Care Management Note (Signed)
CARE MANAGEMENT NOTE 08/04/2014  Patient:  [name] [name],[name] [name]   Account Number:  [id number]  Date Initiated:  08/04/2014  Documentation initiated by:  Marney Doctor  Subjective/Objective Assessment:   80 yo admitted with Diarrhea. history of pacemaker placement, A. Fib on Coumadin, diastolic congestive heart failure, hypertension, diabetes type 2,     Action/Plan:   From home with family   Anticipated DC Date:  08/06/2014   Anticipated DC Plan:  [place]  CM consult      Choice offered to / List presented to:             Status of service:  In process, will continue to follow Medicare Important Message given?   (If response is "NO", the following Medicare IM given date fields will be blank) Date Medicare IM given:   Medicare IM given by:   Date Additional Medicare IM given:   Additional Medicare IM given by:    Discharge Disposition:    Per UR Regulation:  Reviewed for med. necessity/level of care/duration of stay  If discussed at [place] of Stay Meetings, dates discussed:    Comments:  08/04/14 Marney Doctor RN,BSN,NCM [id number] Chart reviewed and CM following for DC needs.

## 2014-08-04 NOTE — Evaluation (Signed)
Physical Therapy One Time Evaluation Patient Details Name: [name] [name] MRN: [id number] DOB: 1931/04/21 Today's Date: 08/04/2014   History of Present Illness  80 y.o. female with a PMHx of atrial fibrillation status post pacemaker on chronic Coumadin, chronic diastolic CHF, hypertension, type 2 diabetes, breast cancer, venous insufficiency admitted 08/02/14 for diarrhea.  Clinical Impression  Patient evaluated by Physical Therapy with no further acute PT needs identified.  All education has been completed and the patient has no further questions.  Pt reports LE buckling when up in room earlier however was not using RW and agreeable to always use RW for mobility upon d/c for safety.  Pt also reports neuropathy so discussed checking feet and wearing closed toed thick sole shoes (states she has diabetic shoes ordered but not available yet).  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  Pt very pleasant and thankful for physical therapy session today.  No further follow-up Physial Therapy or equipment needs identified. PT is signing off. Thank you for this referral.      Follow Up Recommendations No PT follow up    Equipment Recommendations  None recommended by PT    Recommendations for Other Services       Precautions / Restrictions Precautions Precautions: Fall      Mobility  Bed Mobility Overal bed mobility: Modified Independent             General bed mobility comments: sitting EOB on arrival  Transfers Overall transfer level: Needs assistance Equipment used: Rolling walker (2 wheeled) Transfers: Sit to/from Stand Sit to Stand: Supervision         General transfer comment: safe technique observed  Ambulation/Gait Ambulation/Gait assistance: Supervision Ambulation Distance (Feet): 400 Feet Assistive device: Rolling walker (2 wheeled) Gait Pattern/deviations: Step-through pattern Gait  velocity: decr Gait velocity interpretation: Below normal speed for age/gender General Gait Details: slow pace however no unsteadiness observed with RW, pt reports attempting to ambulate in room earlier without RW and LE buckling so highly encouraged using RW all the time for safety upon d/c and pt agreeable  Stairs            Wheelchair Mobility    Modified Rankin (Stroke Patients Only)       Balance Overall balance assessment: No apparent balance deficits (not formally assessed) (reports last fall 3 years ago)                                           Pertinent Vitals/Pain Pain Assessment: No/denies pain    Home Living Family/patient expects to be discharged to:: Private residence Living Arrangements: Other relatives   Type of Home: House Home Access: Level entry     Home Layout: One level Home Equipment: Environmental consultant - 2 wheels      Prior Function Level of Independence: Independent with assistive device(s)         Comments: pt reports using RW most of the time after ankle surgery 3 years ago, 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     Hand Dominance        Extremity/Trunk Assessment   Upper Extremity Assessment: Overall WFL for tasks assessed           Lower Extremity Assessment: Overall WFL for tasks assessed         Communication  Communication: No difficulties  Cognition Arousal/Alertness: Awake/alert Behavior During Therapy: WFL for tasks assessed/performed Overall Cognitive Status: Within Functional Limits for tasks assessed                      General Comments      Exercises        Assessment/Plan    PT Assessment Patent does not need any further PT services  PT Diagnosis Difficulty walking   PT Problem List    PT Treatment Interventions     PT Goals (Current goals can be found in the Care Plan section) Acute Rehab PT Goals PT Goal Formulation: All assessment and education complete, DC therapy     Frequency     Barriers to discharge        Co-evaluation               End of Session   Activity Tolerance: Patient tolerated treatment well Patient left: in chair;with call bell/phone within reach           Time: [id number] PT Time Calculation (min): 25 min   Charges:   PT Evaluation $Initial PT Evaluation Tier I: 1 Procedure PT Treatments $Gait Training: 8-22 mins   PT G Codes:          Lisbet Busker,KATHrine E 08/04/2014, 3:25 PM Carmelia Bake, PT, DPT 08/04/2014 Pager: [phone]

## 2014-08-04 NOTE — Progress Notes (Signed)
ANTICOAGULATION CONSULT NOTE - Initial Consult  Pharmacy Consult for Warfarin Indication: atrial fibrillation  Allergies  Allergen Reactions  . Morphine And Related Other (See Comments)    Family requests no morphine due to past reaction    Patient Measurements: Height: 6' (182.9 cm) Weight: 228 lb 6.4 oz (103.602 kg) IBW/kg (Calculated) : 73.1   Vital Signs: Temp: 98.1 F (36.7 C) (11/05 0611) Temp Source: Oral (11/05 0611) BP: 136/76 mmHg (11/05 0611) Pulse Rate: 64 (11/05 0611)  Labs:  Recent Labs  08/02/14 1710  08/02/14 1715 08/03/14 0640 08/04/14 0445 08/04/14 1327  HGB  --   < > 12.9 10.4*  --  11.2*  HCT  --   --  38.7 31.7*  --  33.5*  PLT  --   --  298 237  --  275  LABPROT 46.1*  --   --  50.3* 28.6*  --   INR 4.96*  --   --  5.49* 2.66*  --   CREATININE  --   --  1.20* 1.01  --  0.92  < > = values in this interval not displayed.  Estimated Creatinine Clearance: 64.6 mL/min (by C-G formula based on Cr of 0.92).   Medical History: Past Medical History  Diagnosis Date  . Hypertension   . Diabetes mellitus   . Tachy-brady syndrome     s/p pacer 07/2009  . Atrial fibrillation     flecainide; coumadin  . Atrial flutter     November, 2010, flecainide, Coumadin  . Chronic diastolic heart failure     echo 10/10: mild LVH, EF 55-60%, mild AI, mild MR, mild to mod LAE, mild RVE, severe RAE, mod TR, PASP 53;  Echo 3/14:  Mild LVH, EF 55%, Tr AI, MAC, mild MR, mod LAE, PASP 31, trivial eff.  . Shortness of breath     myoview 11/10: EF 65%, no ischemia  . Pulmonary HTN     53 mmHg echo, 2010, moderate TR, mild right ventricular enlargement  . DVT (deep venous thrombosis)     Pulmonary embolus  . Pulmonary embolus 2006    2006, with DVT  . Carotid artery disease     [id number]: RICA [id number], LICA [id number]  . Thyroid nodule     non-neoplastic goiter  . Breast cancer   . Ejection fraction     EF 60%, echo, October, 2010  . Warfarin anticoagulation     Atrial  fibrillation  . Pacemaker     November, 2010, Dr Rayann Heman - MDT ADDRL1 Adapta DC PPM ser #: [id number]    . S/P laminectomy     June 22, 2011  . Syncope     2010, treated with pacemaker  . Drug therapy     FLECAINIDE  . Mitral regurgitation     mild, echo, October, 2010  . Mediastinal lymphadenopathy   . Venous insufficiency     Chronic  . Hypokalemia     October, 2012, potassium dose increased  . Hiatal hernia 2006    EGD  . Acute gastric ulcer without mention of hemorrhage, perforation, or obstruction 2006    EGD  . Esophagitis, unspecified 2006    EGD  . Allergy     seasonal  . Diverticulosis   . Hx of adenomatous colonic polyps   . Fecal incontinence   . Breast cancer   . Osteopenia     Assessment: 66 yoF with PMH of afib s/p pacemaker (on chronic coumadin), chronic diastolic CHF, HTN,  DM, admitted with c/o diarrhea. INR upon admission was supratherapeutic at 4.96. Coumadin held on admission, 5mg  of vitamin K given on 11/3, 1mg  of vitamin K given on 11/4. Currently being treated with Cipro/Flagyl for possible C.diff infection. (pt was on keflex for UTI recently as an outpatient)  Home dose Warfarin: 5mg  Sun/Thurs and 2.5mg  all other days INR today 2.66        CBC: Hgb 11.2  Drug Interactions: Ciprofloxacin may increase effects of warfarin (lower doses of warfarin may be required) Diet: regular diet  Goal of Therapy:  INR 2-3 Monitor platelets by anticoagulation protocol: Yes   Plan:  Give Warfarin 2.5mg  po x 1 dose today Daily INR Follow CBC   Kizzie Furnish, PharmD Pager: [phone] 08/04/2014 2:25 PM

## 2014-08-05 DIAGNOSIS — I482 Chronic atrial fibrillation: Secondary | ICD-10-CM

## 2014-08-05 LAB — PROTIME-INR
INR: 1.84 — ABNORMAL HIGH (ref 0.00–1.49)
Prothrombin Time: 21.4 seconds — ABNORMAL HIGH (ref 11.6–15.2)

## 2014-08-05 LAB — GLUCOSE, CAPILLARY: Glucose-Capillary: 135 mg/dL — ABNORMAL HIGH (ref 70–99)

## 2014-08-05 NOTE — Progress Notes (Addendum)
Patient seen and examined at bedside. Please refer to discharge summary completed 08/04/2014. No changes in medical management.  Patient is stable for discharge today.  Leisa Lenz [facility] [id number]

## 2014-08-05 NOTE — Plan of Care (Signed)
Problem: Phase I Progression Outcomes Goal: OOB as tolerated unless otherwise ordered Outcome: Completed/Met Date Met:  08/05/14 Goal: Initial discharge plan identified Outcome: Completed/Met Date Met:  08/05/14 Goal: Other Phase I Outcomes/Goals Outcome: Completed/Met Date Met:  08/05/14  Problem: Phase II Progression Outcomes Goal: Progress activity as tolerated unless otherwise ordered Outcome: Completed/Met Date Met:  08/05/14 Goal: Discharge plan established Outcome: Completed/Met Date Met:  08/05/14 Goal: Vital signs remain stable Outcome: Completed/Met Date Met:  08/05/14 Goal: IV changed to normal saline lock Outcome: Completed/Met Date Met:  08/05/14 Goal: Obtain order to discontinue catheter if appropriate Outcome: Not Applicable Date Met:  97/74/14 Goal: Other Phase II Outcomes/Goals Outcome: Completed/Met Date Met:  08/05/14  Problem: Phase III Progression Outcomes Goal: Pain controlled on oral analgesia Outcome: Completed/Met Date Met:  08/05/14 Goal: Activity at appropriate level-compared to baseline (UP IN CHAIR FOR HEMODIALYSIS)  Outcome: Completed/Met Date Met:  08/05/14 Goal: Voiding independently Outcome: Completed/Met Date Met:  08/05/14 Goal: Foley discontinued Outcome: Not Applicable Date Met:  23/95/32

## 2014-08-05 NOTE — Progress Notes (Signed)
Pt. Has been discharged home she was given her discharge instructions and all questions were answered. She is to be transported home by her family.

## 2014-08-05 NOTE — Care Management Note (Signed)
CARE MANAGEMENT NOTE 08/05/2014  Patient:  [name] [name],[name] [name]   Account Number:  [id number]  Date Initiated:  08/04/2014  Documentation initiated by:  Marney Doctor  Subjective/Objective Assessment:   80 yo admitted with Diarrhea. history of pacemaker placement, A. Fib on Coumadin, diastolic congestive heart failure, hypertension, diabetes type 2,     Action/Plan:   From home with family   Anticipated DC Date:  08/05/2014   Anticipated DC Plan:  [place]  CM consult      Choice offered to / List presented to:             Status of service:  In process, will continue to follow Medicare Important Message given?  YES (If response is "NO", the following Medicare IM given date fields will be blank) Date Medicare IM given:  08/05/2014 Medicare IM given by:  Marney Doctor Date Additional Medicare IM given:   Additional Medicare IM given by:    Discharge Disposition:    Per UR Regulation:  Reviewed for med. necessity/level of care/duration of stay  If discussed at [place] of Stay Meetings, dates discussed:    Comments:  08/05/14 Marney Doctor RN,BSN, NCM Pt is to DC home today.  Spoke with pt prior to DC, and pt has no CM DC needs.  08/04/14 Marney Doctor RN,BSN,NCM [phone] Chart reviewed and CM following for DC needs.

## 2014-08-05 NOTE — Progress Notes (Signed)
ANTICOAGULATION CONSULT NOTE - Initial Consult  Pharmacy Consult for Warfarin Indication: atrial fibrillation  Allergies  Allergen Reactions  . Morphine And Related Other (See Comments)    Family requests no morphine due to past reaction    Patient Measurements: Height: 6' (182.9 cm) Weight: 228 lb 6.4 oz (103.602 kg) IBW/kg (Calculated) : 73.1   Vital Signs: Temp: 97.5 F (36.4 C) (11/06 0610) Temp Source: Oral (11/06 0610) BP: 149/69 mmHg (11/06 0610) Pulse Rate: 64 (11/06 0610)  Labs:  Recent Labs  08/02/14 1715 08/03/14 0640 08/04/14 0445 08/04/14 1327 08/05/14 0500  HGB 12.9 10.4*  --  11.2*  --   HCT 38.7 31.7*  --  33.5*  --   PLT 298 237  --  275  --   LABPROT  --  50.3* 28.6*  --  21.4*  INR  --  5.49* 2.66*  --  1.84*  CREATININE 1.20* 1.01  --  0.92  --     Estimated Creatinine Clearance: 64.6 mL/min (by C-G formula based on Cr of 0.92).   Medical History: Past Medical History  Diagnosis Date  . Hypertension   . Diabetes mellitus   . Tachy-brady syndrome     s/p pacer 07/2009  . Atrial fibrillation     flecainide; coumadin  . Atrial flutter     November, 2010, flecainide, Coumadin  . Chronic diastolic heart failure     echo 10/10: mild LVH, EF 55-60%, mild AI, mild MR, mild to mod LAE, mild RVE, severe RAE, mod TR, PASP 53;  Echo 3/14:  Mild LVH, EF 55%, Tr AI, MAC, mild MR, mod LAE, PASP 31, trivial eff.  . Shortness of breath     myoview 11/10: EF 65%, no ischemia  . Pulmonary HTN     53 mmHg echo, 2010, moderate TR, mild right ventricular enlargement  . DVT (deep venous thrombosis)     Pulmonary embolus  . Pulmonary embolus 2006    2006, with DVT  . Carotid artery disease     [id number]: RICA [id number], LICA 123456  . Thyroid nodule     non-neoplastic goiter  . Breast cancer   . Ejection fraction     EF 60%, echo, October, 2010  . Warfarin anticoagulation     Atrial fibrillation  . Pacemaker     November, 2010, Dr Rayann Heman - MDT ADDRL1  Adapta DC PPM ser #: [id number]    . S/P laminectomy     June 22, 2011  . Syncope     2010, treated with pacemaker  . Drug therapy     FLECAINIDE  . Mitral regurgitation     mild, echo, October, 2010  . Mediastinal lymphadenopathy   . Venous insufficiency     Chronic  . Hypokalemia     October, 2012, potassium dose increased  . Hiatal hernia 2006    EGD  . Acute gastric ulcer without mention of hemorrhage, perforation, or obstruction 2006    EGD  . Esophagitis, unspecified 2006    EGD  . Allergy     seasonal  . Diverticulosis   . Hx of adenomatous colonic polyps   . Fecal incontinence   . Breast cancer   . Osteopenia     Assessment: 7 yoF with PMH of afib s/p pacemaker (on chronic coumadin), chronic diastolic CHF, HTN, DM, admitted with c/o diarrhea. INR upon admission was supratherapeutic at 4.96. Coumadin held on admission, 5mg  of vitamin K given on 11/3, 1mg  of vitamin K  given on 11/4. Currently being treated with Cipro/Flagyl for possible C.diff infection. (pt was on keflex for UTI recently as an outpatient)  Home dose Warfarin: 5mg  Sun/Thurs and 2.5mg  all other days INR today 1.84  (SUBtherapeutic as expected after doses of vitamin K)    CBC: Hgb 11.2 (11/5) Drug Interactions: Ciprofloxacin may increase effects of warfarin (lower doses of warfarin may be required) Diet: regular diet  Goal of Therapy:  INR 2-3 Monitor platelets by anticoagulation protocol: Yes   Plan:  Patient is to be discharged today on Warfarin 2.5mg  daily with close INR follow-up. I agree with this plan. Patient to take todays warfarin dose after discharge.   Thank you for the consult  Kizzie Furnish, PharmD Pager: [phone] 08/05/2014 11:29 AM

## 2014-08-08 ENCOUNTER — Encounter: Payer: Self-pay | Admitting: Cardiology

## 2014-08-08 ENCOUNTER — Ambulatory Visit (INDEPENDENT_AMBULATORY_CARE_PROVIDER_SITE_OTHER): Payer: Medicare HMO | Admitting: Cardiology

## 2014-08-08 ENCOUNTER — Ambulatory Visit (INDEPENDENT_AMBULATORY_CARE_PROVIDER_SITE_OTHER): Payer: Medicare HMO | Admitting: *Deleted

## 2014-08-08 VITALS — BP 130/68 | HR 64 | Ht 72.0 in | Wt 219.0 lb

## 2014-08-08 DIAGNOSIS — Z7901 Long term (current) use of anticoagulants: Secondary | ICD-10-CM

## 2014-08-08 DIAGNOSIS — I482 Chronic atrial fibrillation, unspecified: Secondary | ICD-10-CM

## 2014-08-08 DIAGNOSIS — Z950 Presence of cardiac pacemaker: Secondary | ICD-10-CM

## 2014-08-08 DIAGNOSIS — I4891 Unspecified atrial fibrillation: Secondary | ICD-10-CM

## 2014-08-08 DIAGNOSIS — I2699 Other pulmonary embolism without acute cor pulmonale: Secondary | ICD-10-CM

## 2014-08-08 DIAGNOSIS — I5032 Chronic diastolic (congestive) heart failure: Secondary | ICD-10-CM

## 2014-08-08 DIAGNOSIS — Z5181 Encounter for therapeutic drug level monitoring: Secondary | ICD-10-CM

## 2014-08-08 LAB — GLUCOSE, CAPILLARY: GLUCOSE-CAPILLARY: 96 mg/dL (ref 70–99)

## 2014-08-08 LAB — POCT INR: INR: 1.8

## 2014-08-08 NOTE — Assessment & Plan Note (Signed)
Recently in the hospital her INR was elevated. It is my understanding she received vitamin K. Follow make sure that she is seen by the Coumadin clinic today to be sure that she is on the proper dosing.

## 2014-08-08 NOTE — Assessment & Plan Note (Signed)
Volume status is stable. No change in therapy. 

## 2014-08-08 NOTE — Patient Instructions (Signed)
Your physician recommends that you continue on your current medications as directed. Please refer to the Current Medication list given to you today.  Your physician recommends that you schedule a follow-up appointment in: 3 months  

## 2014-08-08 NOTE — Progress Notes (Signed)
Patient ID: [name] [name], female   DOB: [birth year], 80 y.o.   MRN: [id number]    HPI  patient is seen today to follow-up her supraventricular tachycardia and atrial fibrillation. She holds sinus with flecainide. Recently she had some problems with diverticulosis. She had some diarrhea and felt poorly. She eventually ended up in the hospital. Most recently she had a decreased potassium and elevated INR. She received vitamin K. She stabilized and went home. She's now feeling better.  Allergies  Allergen Reactions  . Morphine And Related Other (See Comments)    Family requests no morphine due to past reaction    Current Outpatient Prescriptions  Medication Sig Dispense Refill  . allopurinol (ZYLOPRIM) 300 MG tablet Take 150 mg by mouth daily as needed (pain in fingers/toes.).     Marland Kitchen anastrozole (ARIMIDEX) 1 MG tablet Take 1 tablet (1 mg total) by mouth daily. 90 tablet 3  . bethanechol (URECHOLINE) 25 MG tablet Take 25 mg by mouth daily.    Marland Kitchen dicyclomine (BENTYL) 10 MG capsule Take 10 mg by mouth 2 (two) times daily.    . flecainide (TAMBOCOR) 100 MG tablet Take 100 mg by mouth 2 (two) times daily.    . furosemide (LASIX) 40 MG tablet Take 40 mg by mouth 2 (two) times daily.    Marland Kitchen gabapentin (NEURONTIN) 600 MG tablet Take 0.5 tablets (300 mg total) by mouth 2 (two) times daily. 60 tablet 2  . metFORMIN (GLUCOPHAGE) 500 MG tablet Take 750 mg by mouth 2 (two) times daily with a meal.    . metoprolol succinate (TOPROL-XL) 25 MG 24 hr tablet TAKE 2 TABLETS (50 MG TOTAL) BY MOUTH DAILY. 60 tablet 3  . oxyCODONE-acetaminophen (PERCOCET/ROXICET) 5-325 MG per tablet Take 1-2 tablets by mouth every 4 (four) hours as needed for severe pain. 15 tablet 0  . polyethylene glycol (MIRALAX) packet Take 17 g by mouth daily. (Patient taking differently: Take 17 g by mouth daily as needed (for constipation). ) 14 each 0  . potassium chloride 20 MEQ/15ML (10%) solution Take 20 mEq by mouth daily.     .  promethazine (PHENERGAN) 25 MG tablet Take 1 tablet (25 mg total) by mouth every 8 (eight) hours as needed for nausea or vomiting. 15 tablet 0  . pyridoxine (B-6) 100 MG tablet Take 200 mg by mouth daily.     . vitamin B-12 (CYANOCOBALAMIN) 500 MCG tablet Take 500 mcg by mouth daily.     Marland Kitchen warfarin (COUMADIN) 2.5 MG tablet Take 1 tablet (2.5 mg total) by mouth one time only at 6 PM.     No current facility-administered medications for this visit.    History   Social History  . Marital Status: Widowed    Spouse Name: N/A    Number of Children: 3  . Years of Education: N/A   Occupational History  . Retired     Social History Main Topics  . Smoking status: Former Smoker -- 4.00 packs/day    Quit date: 10/26/1998  . Smokeless tobacco: Never Used  . Alcohol Use: No  . Drug Use: No  . Sexual Activity: Not on file   Other Topics Concern  . Not on file   Social History Narrative   Widowed.  Lives in [place] by herself.  Avoids caffeine.  Very active, taking care of her brother who also lives by himself and is in his late 25's.    Family History  Problem Relation Age of Onset  . Heart  disease Mother   . COPD Father   . Colon cancer Neg Hx     Past Medical History  Diagnosis Date  . Hypertension   . Diabetes mellitus   . Tachy-brady syndrome     s/p pacer 07/2009  . Atrial fibrillation     flecainide; coumadin  . Atrial flutter     November, 2010, flecainide, Coumadin  . Chronic diastolic heart failure     echo 10/10: mild LVH, EF 55-60%, mild AI, mild MR, mild to mod LAE, mild RVE, severe RAE, mod TR, PASP 53;  Echo 3/14:  Mild LVH, EF 55%, Tr AI, MAC, mild MR, mod LAE, PASP 31, trivial eff.  . Shortness of breath     myoview 11/10: EF 65%, no ischemia  . Pulmonary HTN     53 mmHg echo, 2010, moderate TR, mild right ventricular enlargement  . DVT (deep venous thrombosis)     Pulmonary embolus  . Pulmonary embolus 2006    2006, with DVT  . Carotid artery disease      [id number]: RICA [id number], LICA [id number]  . Thyroid nodule     non-neoplastic goiter  . Breast cancer   . Ejection fraction     EF 60%, echo, October, 2010  . Warfarin anticoagulation     Atrial fibrillation  . Pacemaker     November, 2010, Dr Rayann Heman - MDT ADDRL1 Adapta DC PPM ser #: [id number]    . S/P laminectomy     June 22, 2011  . Syncope     2010, treated with pacemaker  . Drug therapy     FLECAINIDE  . Mitral regurgitation     mild, echo, October, 2010  . Mediastinal lymphadenopathy   . Venous insufficiency     Chronic  . Hypokalemia     October, 2012, potassium dose increased  . Hiatal hernia 2006    EGD  . Acute gastric ulcer without mention of hemorrhage, perforation, or obstruction 2006    EGD  . Esophagitis, unspecified 2006    EGD  . Allergy     seasonal  . Diverticulosis   . Hx of adenomatous colonic polyps   . Fecal incontinence   . Breast cancer   . Osteopenia     Past Surgical History  Procedure Laterality Date  . Breast surgery      Left  . Abdominal hysterectomy    . Back surgery      1973, 2012   . Ankle surgery      Patient Active Problem List   Diagnosis Date Noted  . Diarrhea 08/03/2014  . Supratherapeutic INR   . Rectal bleeding   . Sick sinus syndrome 03/09/2014  . Encounter for therapeutic drug monitoring 11/04/2013  . Right leg swelling 07/19/2013  . Hypertension 06/21/2013  . Osteopenia   . Hypokalemia   . Tachy-brady syndrome   . Atrial fibrillation   . Atrial flutter   . Chronic diastolic heart failure   . Shortness of breath   . Pulmonary HTN   . DVT (deep venous thrombosis)   . Pulmonary embolus   . Carotid artery disease   . Breast cancer   . Ejection fraction   . Warfarin anticoagulation   . Pacemaker-Medtronic   . S/P laminectomy   . Syncope   . Drug therapy   . Mitral regurgitation   . Mediastinal lymphadenopathy   . Venous insufficiency   . Ulcer of toe 04/26/2011  . THYROID NODULE, HX OF 07/19/2009  .  DM  06/13/2008  . ALLERGIC RHINITIS 06/13/2008  . MEDIASTINAL LYMPHADENOPATHY 06/13/2008    ROS   patient denies fever, chills, headache, sweats, rash, change in vision, change in hearing, chest pain, cough, nausea or vomiting, urinary symptoms. All other systems are reviewed and are negative.  PHYSICAL EXAM  The patients here with her daughter today. She stable. She feels better than the hospital. She is oriented to person time and place. Affect is normal. Head is atraumatic. Sclera and conjunctiva are normal. There is no jugular venous distention. Lungs are clear. Respiratory effort is not labored. Abdomen is soft. Cardiac exam reveals an S1 and S2. There is no peripheral edema. The rhythm is regular.  Filed Vitals:   08/08/14 0910  BP: 130/68  Pulse: 64  Height: 6' (1.829 m)  Weight: 219 lb (99.338 kg)  SpO2: 97%     ASSESSMENT & PLAN

## 2014-08-08 NOTE — Assessment & Plan Note (Signed)
The patient has received a equipment to do home monitoring. She will speak with our electrophysiology team to decide if she will use it or not

## 2014-08-17 ENCOUNTER — Telehealth: Payer: Self-pay | Admitting: Hematology and Oncology

## 2014-08-17 ENCOUNTER — Encounter: Payer: Self-pay | Admitting: Hematology and Oncology

## 2014-08-17 ENCOUNTER — Ambulatory Visit (HOSPITAL_BASED_OUTPATIENT_CLINIC_OR_DEPARTMENT_OTHER): Payer: Medicare HMO | Admitting: Hematology and Oncology

## 2014-08-17 VITALS — BP 159/71 | HR 80 | Temp 97.9°F | Resp 18 | Ht 72.0 in | Wt 220.3 lb

## 2014-08-17 DIAGNOSIS — I482 Chronic atrial fibrillation, unspecified: Secondary | ICD-10-CM

## 2014-08-17 DIAGNOSIS — C50912 Malignant neoplasm of unspecified site of left female breast: Secondary | ICD-10-CM

## 2014-08-17 DIAGNOSIS — Z170 Estrogen receptor positive status [ER+]: Secondary | ICD-10-CM

## 2014-08-17 DIAGNOSIS — C50412 Malignant neoplasm of upper-outer quadrant of left female breast: Secondary | ICD-10-CM

## 2014-08-17 MED ORDER — ANASTROZOLE 1 MG PO TABS: 1.0000 mg | ORAL_TABLET | Freq: Every day | ORAL | Status: DC

## 2014-08-17 NOTE — Assessment & Plan Note (Signed)
Clinically, she has no evidence of recurrence. She missed having a screening mammogram this year. I recommend she call mammogram Center to have it rescheduled. In the meantime, we'll continue anastrozole without interruption. Plan would be to complete 5 years of treatment until May of next year.

## 2014-08-17 NOTE — Progress Notes (Signed)
[facility] OFFICE PROGRESS NOTE  Patient Care Team: Foye Spurling, MD as PCP - General (Endocrinology) Lafayette Dragon, MD (Gastroenterology) Heath Lark, MD as Consulting Physician (Hematology and Oncology)  SUMMARY OF ONCOLOGIC HISTORY: Oncology History   Breast cancer   Primary site: Breast (Left)   Clinical free text: IIB   Clinical: Stage IIB (T2, N1, cM0) signed by Heath Lark, MD on 08/17/2013  9:28 AM   Pathologic: Stage IIB (T2, N1, cM0) signed by Heath Lark, MD on 08/17/2013  9:28 AM   Summary: Stage IIB (T2, N1, cM0)      Breast cancer, left breast    Initial Diagnosis Breast cancer   01/16/2010 Procedure Needle biopsy confirmed invasive ductal carcinoma, 100% ER+,62% PR+, Her2 by CISH 1.74   02/07/2010 Surgery She had left mastectomy and SLN biopsy, 3.5 x 2 x 2 cm mass, 1 positive LN, Nottingham Grade II,  final staging IIB    INTERVAL HISTORY: Please see below for problem oriented charting. She is doing well. Denies any palpable mass. She denies any recent abnormal breast examination, palpable mass, abnormal breast appearance or nipple changes   REVIEW OF SYSTEMS:   Constitutional: Denies fevers, chills or abnormal weight loss Eyes: Denies blurriness of vision Ears, nose, mouth, throat, and face: Denies mucositis or sore throat Respiratory: Denies cough, dyspnea or wheezes Cardiovascular: Denies palpitation, chest discomfort or lower extremity swelling Gastrointestinal:  Denies nausea, heartburn or change in bowel habits Skin: Denies abnormal skin rashes Lymphatics: Denies new lymphadenopathy or easy bruising Neurological:Denies numbness, tingling or new weaknesses Behavioral/Psych: Mood is stable, no new changes  All other systems were reviewed with the patient and are negative.  I have reviewed the past medical history, past surgical history, social history and family history with the patient and they are unchanged from previous  note.  ALLERGIES:  is allergic to morphine and related.  MEDICATIONS:  Current Outpatient Prescriptions  Medication Sig Dispense Refill  . allopurinol (ZYLOPRIM) 300 MG tablet Take 150 mg by mouth daily as needed (pain in fingers/toes.).     Marland Kitchen anastrozole (ARIMIDEX) 1 MG tablet Take 1 tablet (1 mg total) by mouth daily. 90 tablet 3  . bethanechol (URECHOLINE) 25 MG tablet Take 25 mg by mouth daily.    Marland Kitchen dicyclomine (BENTYL) 10 MG capsule Take 10 mg by mouth 2 (two) times daily.    . flecainide (TAMBOCOR) 100 MG tablet Take 100 mg by mouth 2 (two) times daily.    . furosemide (LASIX) 40 MG tablet Take 40 mg by mouth 2 (two) times daily.    Marland Kitchen gabapentin (NEURONTIN) 600 MG tablet Take 0.5 tablets (300 mg total) by mouth 2 (two) times daily. 60 tablet 2  . metFORMIN (GLUCOPHAGE) 500 MG tablet Take 750 mg by mouth 2 (two) times daily with a meal.    . metoprolol succinate (TOPROL-XL) 25 MG 24 hr tablet TAKE 2 TABLETS (50 MG TOTAL) BY MOUTH DAILY. 60 tablet 3  . oxyCODONE-acetaminophen (PERCOCET/ROXICET) 5-325 MG per tablet Take 1-2 tablets by mouth every 4 (four) hours as needed for severe pain. 15 tablet 0  . polyethylene glycol (MIRALAX) packet Take 17 g by mouth daily. (Patient taking differently: Take 17 g by mouth daily as needed (for constipation). ) 14 each 0  . potassium chloride 20 MEQ/15ML (10%) solution Take 20 mEq by mouth daily.     . promethazine (PHENERGAN) 25 MG tablet Take 1 tablet (25 mg total) by mouth every 8 (eight)  hours as needed for nausea or vomiting. 15 tablet 0  . pyridoxine (B-6) 100 MG tablet Take 200 mg by mouth daily.     . vitamin B-12 (CYANOCOBALAMIN) 500 MCG tablet Take 500 mcg by mouth daily.     Marland Kitchen warfarin (COUMADIN) 2.5 MG tablet Take 1 tablet (2.5 mg total) by mouth one time only at 6 PM.     No current facility-administered medications for this visit.    PHYSICAL EXAMINATION: ECOG PERFORMANCE STATUS: 0 - Asymptomatic  Filed Vitals:   08/17/14 0816   BP: 159/71  Pulse: 80  Temp: 97.9 F (36.6 C)  Resp: 18   Filed Weights   08/17/14 0816  Weight: 220 lb 4.8 oz (99.927 kg)    GENERAL:alert, no distress and comfortable SKIN: skin color, texture, turgor are normal, no rashes or significant lesions EYES: normal, Conjunctiva are pink and non-injected, sclera clear OROPHARYNX:no exudate, no erythema and lips, buccal mucosa, and tongue normal  NECK: supple, thyroid normal size, non-tender, without nodularity LYMPH:  no palpable lymphadenopathy in the cervical, axillary or inguinal LUNGS: clear to auscultation and percussion with normal breathing effort HEART: regular rate & rhythm and no murmurs and no lower extremity edema ABDOMEN:abdomen soft, non-tender and normal bowel sounds Musculoskeletal:no cyanosis of digits and no clubbing  NEURO: alert & oriented x 3 with fluent speech, no focal motor/sensory deficits Bilateral breast examination was performed. Normal breast exam on the right. Well-healed mastectomy scar on the left with no other abnormalities. LABORATORY DATA:  I have reviewed the data as listed    Component Value Date/Time   NA 140 08/04/2014 1327   NA 139 03/22/2013 1015   NA 140 10/14/2011 0937   K 3.9 08/04/2014 1327   K 3.7 03/22/2013 1015   K 4.3 10/14/2011 0937   CL 104 08/04/2014 1327   CL 101 03/22/2013 1015   CL 100 10/14/2011 0937   CO2 22 08/04/2014 1327   CO2 26 03/22/2013 1015   CO2 29 10/14/2011 0937   GLUCOSE 131* 08/04/2014 1327   GLUCOSE 197* 03/22/2013 1015   GLUCOSE 268* 10/14/2011 0937   BUN 10 08/04/2014 1327   BUN 23.2 03/22/2013 1015   BUN 14 10/14/2011 0937   CREATININE 0.92 08/04/2014 1327   CREATININE 1.3* 03/22/2013 1015   CREATININE 1.2 10/14/2011 0937   CALCIUM 9.4 08/04/2014 1327   CALCIUM 9.8 03/22/2013 1015   CALCIUM 9.2 10/14/2011 0937   PROT 8.0 08/02/2014 1715   PROT 7.6 03/22/2013 1015   PROT 7.2 10/14/2011 0937   ALBUMIN 3.8 08/02/2014 1715   ALBUMIN 3.5 03/22/2013  1015   AST 22 08/02/2014 1715   AST 16 03/22/2013 1015   AST 15 10/14/2011 0937   ALT 16 08/02/2014 1715   ALT 12 03/22/2013 1015   ALT 17 10/14/2011 0937   ALKPHOS 99 08/02/2014 1715   ALKPHOS 102 03/22/2013 1015   ALKPHOS 109* 10/14/2011 0937   BILITOT 0.7 08/02/2014 1715   BILITOT 0.60 03/22/2013 1015   BILITOT 0.50 10/14/2011 0937   GFRNONAA 57* 08/04/2014 1327   GFRAA 66* 08/04/2014 1327    No results found for: SPEP, UPEP  Lab Results  Component Value Date   WBC 4.4 08/04/2014   NEUTROABS 3.9 08/02/2014   HGB 11.2* 08/04/2014   HCT 33.5* 08/04/2014   MCV 90.5 08/04/2014   PLT 275 08/04/2014      Chemistry      Component Value Date/Time   NA 140 08/04/2014 1327  NA 139 03/22/2013 1015   NA 140 10/14/2011 0937   K 3.9 08/04/2014 1327   K 3.7 03/22/2013 1015   K 4.3 10/14/2011 0937   CL 104 08/04/2014 1327   CL 101 03/22/2013 1015   CL 100 10/14/2011 0937   CO2 22 08/04/2014 1327   CO2 26 03/22/2013 1015   CO2 29 10/14/2011 0937   BUN 10 08/04/2014 1327   BUN 23.2 03/22/2013 1015   BUN 14 10/14/2011 0937   CREATININE 0.92 08/04/2014 1327   CREATININE 1.3* 03/22/2013 1015   CREATININE 1.2 10/14/2011 0937      Component Value Date/Time   CALCIUM 9.4 08/04/2014 1327   CALCIUM 9.8 03/22/2013 1015   CALCIUM 9.2 10/14/2011 0937   ALKPHOS 99 08/02/2014 1715   ALKPHOS 102 03/22/2013 1015   ALKPHOS 109* 10/14/2011 0937   AST 22 08/02/2014 1715   AST 16 03/22/2013 1015   AST 15 10/14/2011 0937   ALT 16 08/02/2014 1715   ALT 12 03/22/2013 1015   ALT 17 10/14/2011 0937   BILITOT 0.7 08/02/2014 1715   BILITOT 0.60 03/22/2013 1015   BILITOT 0.50 10/14/2011 0937     ASSESSMENT & PLAN:  Breast cancer, left breast Clinically, she has no evidence of recurrence. She missed having a screening mammogram this year. I recommend she call mammogram Center to have it rescheduled. In the meantime, we'll continue anastrozole without interruption. Plan would be to  complete 5 years of treatment until May of next year.  Atrial fibrillation She is currently rate controlled with medications. She remained on anticoagulation therapy and has no complication with bleeding.   No orders of the defined types were placed in this encounter.   All questions were answered. The patient knows to call the clinic with any problems, questions or concerns. No barriers to learning was detected. I spent 15 minutes counseling the patient face to face. The total time spent in the appointment was 20 minutes and more than 50% was on counseling and review of test results     [facility], [place], MD 08/17/2014 10:28 AM

## 2014-08-17 NOTE — Telephone Encounter (Signed)
s.w. pt and advised on May 2016 appt....mailed pt appt sched/avs and letter

## 2014-08-17 NOTE — Assessment & Plan Note (Signed)
She is currently rate controlled with medications. She remained on anticoagulation therapy and has no complication with bleeding.

## 2014-08-23 ENCOUNTER — Ambulatory Visit (INDEPENDENT_AMBULATORY_CARE_PROVIDER_SITE_OTHER): Payer: Medicare HMO | Admitting: *Deleted

## 2014-08-23 DIAGNOSIS — Z7901 Long term (current) use of anticoagulants: Secondary | ICD-10-CM

## 2014-08-23 DIAGNOSIS — I482 Chronic atrial fibrillation, unspecified: Secondary | ICD-10-CM

## 2014-08-23 DIAGNOSIS — I4891 Unspecified atrial fibrillation: Secondary | ICD-10-CM

## 2014-08-23 DIAGNOSIS — I2699 Other pulmonary embolism without acute cor pulmonale: Secondary | ICD-10-CM

## 2014-08-23 DIAGNOSIS — Z5181 Encounter for therapeutic drug level monitoring: Secondary | ICD-10-CM

## 2014-08-23 LAB — POCT INR: INR: 2.8

## 2014-08-27 ENCOUNTER — Encounter (HOSPITAL_COMMUNITY): Payer: Self-pay | Admitting: Emergency Medicine

## 2014-08-27 ENCOUNTER — Emergency Department (HOSPITAL_COMMUNITY): Payer: Medicare HMO

## 2014-08-27 ENCOUNTER — Inpatient Hospital Stay (HOSPITAL_COMMUNITY)
Admission: EM | Admit: 2014-08-27 | Discharge: 2014-08-28 | DRG: 293 | Disposition: A | Discharge status: Home / Self Care | Payer: Medicare HMO | Attending: Cardiology | Admitting: Cardiology

## 2014-08-27 DIAGNOSIS — I10 Essential (primary) hypertension: Secondary | ICD-10-CM | POA: Diagnosis present

## 2014-08-27 DIAGNOSIS — Z950 Presence of cardiac pacemaker: Secondary | ICD-10-CM

## 2014-08-27 DIAGNOSIS — I480 Paroxysmal atrial fibrillation: Secondary | ICD-10-CM | POA: Diagnosis present

## 2014-08-27 DIAGNOSIS — K5790 Diverticulosis of intestine, part unspecified, without perforation or abscess without bleeding: Secondary | ICD-10-CM | POA: Diagnosis present

## 2014-08-27 DIAGNOSIS — Z886 Allergy status to analgesic agent status: Secondary | ICD-10-CM

## 2014-08-27 DIAGNOSIS — Z7901 Long term (current) use of anticoagulants: Secondary | ICD-10-CM

## 2014-08-27 DIAGNOSIS — Z90710 Acquired absence of both cervix and uterus: Secondary | ICD-10-CM

## 2014-08-27 DIAGNOSIS — E119 Type 2 diabetes mellitus without complications: Secondary | ICD-10-CM | POA: Diagnosis present

## 2014-08-27 DIAGNOSIS — Z9012 Acquired absence of left breast and nipple: Secondary | ICD-10-CM | POA: Diagnosis present

## 2014-08-27 DIAGNOSIS — R0602 Shortness of breath: Secondary | ICD-10-CM

## 2014-08-27 DIAGNOSIS — R42 Dizziness and giddiness: Secondary | ICD-10-CM | POA: Diagnosis present

## 2014-08-27 DIAGNOSIS — Z79899 Other long term (current) drug therapy: Secondary | ICD-10-CM

## 2014-08-27 DIAGNOSIS — Z836 Family history of other diseases of the respiratory system: Secondary | ICD-10-CM

## 2014-08-27 DIAGNOSIS — I5032 Chronic diastolic (congestive) heart failure: Secondary | ICD-10-CM | POA: Diagnosis present

## 2014-08-27 DIAGNOSIS — I5033 Acute on chronic diastolic (congestive) heart failure: Secondary | ICD-10-CM | POA: Diagnosis not present

## 2014-08-27 DIAGNOSIS — I872 Venous insufficiency (chronic) (peripheral): Secondary | ICD-10-CM | POA: Diagnosis present

## 2014-08-27 DIAGNOSIS — Z8249 Family history of ischemic heart disease and other diseases of the circulatory system: Secondary | ICD-10-CM | POA: Diagnosis not present

## 2014-08-27 DIAGNOSIS — I495 Sick sinus syndrome: Secondary | ICD-10-CM | POA: Diagnosis present

## 2014-08-27 LAB — URINALYSIS, ROUTINE W REFLEX MICROSCOPIC
BILIRUBIN URINE: NEGATIVE
Glucose, UA: NEGATIVE mg/dL
Hgb urine dipstick: NEGATIVE
Ketones, ur: NEGATIVE mg/dL
NITRITE: NEGATIVE
Protein, ur: NEGATIVE mg/dL
Specific Gravity, Urine: 1.007 (ref 1.005–1.030)
UROBILINOGEN UA: 1 mg/dL (ref 0.0–1.0)
pH: 7.5 (ref 5.0–8.0)

## 2014-08-27 LAB — CBC WITH DIFFERENTIAL/PLATELET
Basophils Absolute: 0.1 10*3/uL (ref 0.0–0.1)
Basophils Relative: 1 % (ref 0–1)
Eosinophils Absolute: 0.1 10*3/uL (ref 0.0–0.7)
Eosinophils Relative: 1 % (ref 0–5)
HCT: 39 % (ref 36.0–46.0)
Hemoglobin: 12.9 g/dL (ref 12.0–15.0)
Lymphocytes Relative: 19 % (ref 12–46)
Lymphs Abs: 0.9 10*3/uL (ref 0.7–4.0)
MCH: 30.1 pg (ref 26.0–34.0)
MCHC: 33.1 g/dL (ref 30.0–36.0)
MCV: 90.9 fL (ref 78.0–100.0)
Monocytes Absolute: 0.4 10*3/uL (ref 0.1–1.0)
Monocytes Relative: 8 % (ref 3–12)
Neutro Abs: 3.5 10*3/uL (ref 1.7–7.7)
Neutrophils Relative %: 71 % (ref 43–77)
Platelets: 263 10*3/uL (ref 150–400)
RBC: 4.29 MIL/uL (ref 3.87–5.11)
RDW: 14.5 % (ref 11.5–15.5)
WBC: 4.9 10*3/uL (ref 4.0–10.5)

## 2014-08-27 LAB — GLUCOSE, CAPILLARY
Glucose-Capillary: 101 mg/dL — ABNORMAL HIGH (ref 70–99)
Glucose-Capillary: 139 mg/dL — ABNORMAL HIGH (ref 70–99)
Glucose-Capillary: 104 mg/dL — ABNORMAL HIGH (ref 70–99)

## 2014-08-27 LAB — URINE MICROSCOPIC-ADD ON

## 2014-08-27 LAB — I-STAT CHEM 8, ED
BUN: 17 mg/dL (ref 6–23)
CREATININE: 1.3 mg/dL — AB (ref 0.50–1.10)
Calcium, Ion: 1.11 mmol/L — ABNORMAL LOW (ref 1.13–1.30)
Chloride: 99 mEq/L (ref 96–112)
Glucose, Bld: 148 mg/dL — ABNORMAL HIGH (ref 70–99)
HCT: 42 % (ref 36.0–46.0)
Hemoglobin: 14.3 g/dL (ref 12.0–15.0)
Potassium: 3.5 mEq/L — ABNORMAL LOW (ref 3.7–5.3)
Sodium: 140 mEq/L (ref 137–147)
TCO2: 25 mmol/L (ref 0–100)

## 2014-08-27 LAB — PROTIME-INR
INR: 2.32 — ABNORMAL HIGH (ref 0.00–1.49)
PROTHROMBIN TIME: 25.7 s — AB (ref 11.6–15.2)

## 2014-08-27 LAB — TROPONIN I
Troponin I: <0.3 ng/mL (ref <0.30)
Troponin I: <0.3 ng/mL (ref <0.30)

## 2014-08-27 LAB — PRO B NATRIURETIC PEPTIDE: PRO B NATRI PEPTIDE: 825.8 pg/mL — AB (ref 0–450)

## 2014-08-27 LAB — I-STAT TROPONIN, ED: Troponin i, poc: 0 ng/mL (ref 0.00–0.08)

## 2014-08-27 MED ORDER — WARFARIN SODIUM 2.5 MG PO TABS
2.5000 mg | ORAL_TABLET | ORAL | Status: DC
  Filled 2014-08-27: qty 1

## 2014-08-27 MED ORDER — NITROGLYCERIN 0.4 MG SL SUBL: 0.4000 mg | SUBLINGUAL_TABLET | SUBLINGUAL | Status: DC | PRN

## 2014-08-27 MED ORDER — ONDANSETRON HCL 4 MG/2ML IJ SOLN: 4.0000 mg | Freq: Four times a day (QID) | INTRAMUSCULAR | Status: DC | PRN

## 2014-08-27 MED ORDER — FUROSEMIDE 40 MG PO TABS
40.0000 mg | ORAL_TABLET | Freq: Two times a day (BID) | ORAL | Status: DC
  Administered 2014-08-27 – 2014-08-28 (×2): 40 mg via ORAL
  Filled 2014-08-27 (×5): qty 1

## 2014-08-27 MED ORDER — FLECAINIDE ACETATE 100 MG PO TABS
100.0000 mg | ORAL_TABLET | Freq: Two times a day (BID) | ORAL | Status: DC
  Administered 2014-08-27 – 2014-08-28 (×2): 100 mg via ORAL
  Filled 2014-08-27 (×4): qty 1

## 2014-08-27 MED ORDER — BETHANECHOL CHLORIDE 25 MG PO TABS
25.0000 mg | ORAL_TABLET | Freq: Every day | ORAL | Status: DC
  Administered 2014-08-28: 25 mg via ORAL
  Filled 2014-08-27 (×2): qty 1

## 2014-08-27 MED ORDER — DICYCLOMINE HCL 10 MG PO CAPS
10.0000 mg | ORAL_CAPSULE | Freq: Two times a day (BID) | ORAL | Status: DC
Start: 2014-08-27 — End: 2014-08-28
  Administered 2014-08-27 – 2014-08-28 (×2): 10 mg via ORAL
  Filled 2014-08-27 (×3): qty 1

## 2014-08-27 MED ORDER — INSULIN ASPART 100 UNIT/ML ~~LOC~~ SOLN
0.0000 [IU] | Freq: Three times a day (TID) | SUBCUTANEOUS | Status: DC
  Administered 2014-08-27: 2 [IU] via SUBCUTANEOUS
  Administered 2014-08-28: 3 [IU] via SUBCUTANEOUS

## 2014-08-27 MED ORDER — GABAPENTIN 300 MG PO CAPS
300.0000 mg | ORAL_CAPSULE | Freq: Two times a day (BID) | ORAL | Status: DC
  Administered 2014-08-27 – 2014-08-28 (×3): 300 mg via ORAL
  Filled 2014-08-27 (×4): qty 1

## 2014-08-27 MED ORDER — METOPROLOL SUCCINATE ER 50 MG PO TB24
50.0000 mg | ORAL_TABLET | Freq: Every day | ORAL | Status: DC
  Administered 2014-08-28: 50 mg via ORAL
  Filled 2014-08-27 (×2): qty 1

## 2014-08-27 MED ORDER — WARFARIN SODIUM 5 MG PO TABS: 5.0000 mg | ORAL_TABLET | ORAL | Status: DC

## 2014-08-27 MED ORDER — WARFARIN - PHARMACIST DOSING INPATIENT: Freq: Every day | Status: DC

## 2014-08-27 MED ORDER — POTASSIUM CHLORIDE 20 MEQ/15ML (10%) PO LIQD: 20.0000 meq | Freq: Every day | ORAL | Status: DC

## 2014-08-27 MED ORDER — ANASTROZOLE 1 MG PO TABS
1.0000 mg | ORAL_TABLET | Freq: Every day | ORAL | Status: DC
  Administered 2014-08-28: 1 mg via ORAL
  Filled 2014-08-27 (×2): qty 1

## 2014-08-27 MED ORDER — GABAPENTIN 600 MG PO TABS
300.0000 mg | ORAL_TABLET | Freq: Two times a day (BID) | ORAL | Status: DC
  Filled 2014-08-27 (×2): qty 0.5

## 2014-08-27 MED ORDER — ACETAMINOPHEN 325 MG PO TABS: 650.0000 mg | ORAL_TABLET | ORAL | Status: DC | PRN

## 2014-08-27 MED ORDER — POTASSIUM CHLORIDE 20 MEQ/15ML (10%) PO SOLN
20.0000 meq | Freq: Every day | ORAL | Status: DC
  Administered 2014-08-27 – 2014-08-28 (×2): 20 meq via ORAL
  Filled 2014-08-27 (×3): qty 15

## 2014-08-27 MED ORDER — FUROSEMIDE 10 MG/ML IJ SOLN
40.0000 mg | Freq: Once | INTRAMUSCULAR | Status: AC
  Administered 2014-08-27: 40 mg via INTRAVENOUS
  Filled 2014-08-27: qty 4

## 2014-08-27 NOTE — ED Notes (Signed)
Per EMS, pt began having SOB and left arm pain while making breakfast this morning. Pt initially reported left arm pain 10 out of 10. Pt received 1 nitro en route which brought her pain to a 0. NAD at this time. Pt reports mild SOB at this time. Pt alert x 4. Pt reports she had a similar episode yesterday.

## 2014-08-27 NOTE — Progress Notes (Signed)
ANTICOAGULATION CONSULT NOTE - Initial Consult  Pharmacy Consult for coumadin Indication: atrial fibrillation  Allergies  Allergen Reactions  . Morphine And Related Other (See Comments)    Family requests no morphine due to past reaction    Patient Measurements: Height: 6' (182.9 cm) Weight: 212 lb (96.163 kg) (scale B) IBW/kg (Calculated) : 73.1   Vital Signs: Temp: 97.8 F (36.6 C) (11/28 1200) Temp Source: Oral (11/28 1200) BP: 165/77 mmHg (11/28 1200) Pulse Rate: 65 (11/28 1200)  Labs:  Recent Labs  08/27/14 0826 08/27/14 1310  HGB 14.3 12.9  HCT 42.0 39.0  PLT  --  263  LABPROT  --  25.7*  INR  --  2.32*  CREATININE 1.30*  --   TROPONINI  --  <0.30    Estimated Creatinine Clearance: 44.1 mL/min (by C-G formula based on Cr of 1.3).   Medical History: Past Medical History  Diagnosis Date  . Hypertension   . Diabetes mellitus   . Tachy-brady syndrome     s/p pacer 07/2009  . Atrial fibrillation     flecainide; coumadin  . Atrial flutter     November, 2010, flecainide, Coumadin  . Chronic diastolic heart failure     echo 10/10: mild LVH, EF 55-60%, mild AI, mild MR, mild to mod LAE, mild RVE, severe RAE, mod TR, PASP 53;  Echo 3/14:  Mild LVH, EF 55%, Tr AI, MAC, mild MR, mod LAE, PASP 31, trivial eff.  . Shortness of breath     myoview 11/10: EF 65%, no ischemia  . Pulmonary HTN     53 mmHg echo, 2010, moderate TR, mild right ventricular enlargement  . DVT (deep venous thrombosis)     Pulmonary embolus  . Pulmonary embolus 2006    2006, with DVT  . Carotid artery disease     [id number]: RICA [id number], LICA [id number]  . Thyroid nodule     non-neoplastic goiter  . Breast cancer   . Ejection fraction     EF 60%, echo, October, 2010  . Warfarin anticoagulation     Atrial fibrillation  . Pacemaker     November, 2010, Dr Rayann Heman - MDT ADDRL1 Adapta DC PPM ser #: [id number]    . S/P laminectomy     June 22, 2011  . Syncope     2010, treated with  pacemaker  . Drug therapy     FLECAINIDE  . Mitral regurgitation     mild, echo, October, 2010  . Mediastinal lymphadenopathy   . Venous insufficiency     Chronic  . Hypokalemia     October, 2012, potassium dose increased  . Hiatal hernia 2006    EGD  . Acute gastric ulcer without mention of hemorrhage, perforation, or obstruction 2006    EGD  . Esophagitis, unspecified 2006    EGD  . Allergy     seasonal  . Diverticulosis   . Hx of adenomatous colonic polyps   . Fecal incontinence   . Breast cancer   . Osteopenia     Medications:  Prescriptions prior to admission  Medication Sig Dispense Refill Last Dose  . allopurinol (ZYLOPRIM) 300 MG tablet Take 150 mg by mouth daily as needed (pain in fingers/toes.).    unknown  . anastrozole (ARIMIDEX) 1 MG tablet Take 1 tablet (1 mg total) by mouth daily. 90 tablet 3 08/27/2014 at Unknown time  . bethanechol (URECHOLINE) 25 MG tablet Take 25 mg by mouth daily.   08/27/2014 at Unknown time  .  dicyclomine (BENTYL) 10 MG capsule Take 10 mg by mouth 2 (two) times daily.   08/27/2014 at Unknown time  . flecainide (TAMBOCOR) 100 MG tablet Take 100 mg by mouth 2 (two) times daily.   08/27/2014 at Unknown time  . furosemide (LASIX) 40 MG tablet Take 40 mg by mouth 2 (two) times daily.   08/27/2014 at Unknown time  . gabapentin (NEURONTIN) 600 MG tablet Take 0.5 tablets (300 mg total) by mouth 2 (two) times daily. 60 tablet 2 08/27/2014 at Unknown time  . metFORMIN (GLUCOPHAGE) 500 MG tablet Take 750 mg by mouth 2 (two) times daily with a meal.   08/27/2014 at Unknown time  . metoprolol succinate (TOPROL-XL) 25 MG 24 hr tablet TAKE 2 TABLETS (50 MG TOTAL) BY MOUTH DAILY. 60 tablet 3 08/27/2014 at 0630  . potassium chloride 20 MEQ/15ML (10%) solution Take 20 mEq by mouth daily.    08/27/2014 at Unknown time  . pyridoxine (B-6) 100 MG tablet Take 200 mg by mouth daily.    08/27/2014 at Unknown time  . vitamin B-12 (CYANOCOBALAMIN) 500 MCG tablet  Take 500 mcg by mouth daily.    08/27/2014 at Unknown time  . warfarin (COUMADIN) 5 MG tablet Take 2.5-5 mg by mouth daily at 6 PM. Pt takes 5 mg on mon, thu and 2.5 mg all other days   08/26/2014 at Unknown time    Assessment: 80 yo lady to continue coumadin for afib.  Her admit INR is therapeutic.  Baseline Hg/PTLC normal.   Goal of Therapy:  INR 2-3 Monitor platelets by anticoagulation protocol: Yes   Plan:  Continue home regimen 5 mg Mon Thurs and 2.5 mg other days Daily INR Monitor for bleeding complications  Martrice Apt Poteet 08/27/2014,2:07 PM

## 2014-08-27 NOTE — ED Notes (Signed)
Patient transported by Ronny Bacon, [place]

## 2014-08-27 NOTE — H&P (Signed)
CARDIOLOGY HISTORY AND PHYSICAL   Patient ID: [name] [name] MRN: [id number]  DOB/AGE: 1931/09/16 80 y.o. Admit date: 08/27/2014  Primary Care Physician: Foye Spurling, MD Primary Cardiologist:     Ron Parker  Clinical Summary [name] [name] is a 80 y.o.female. I know her well from the office. She had shortness of breath on Thanksgiving day.. Today she had shortness of breath again. She's also had some discomfort in her arm. She says she has had some mild edema. There is a history of an episode of diastolic CHF in the past. She has supraventricular arrhythmias that have been stable on flecainide.   Allergies  Allergen Reactions  . Morphine And Related Other (See Comments)    Family requests no morphine due to past reaction    Home Medications  (Not in a hospital admission)  Scheduled Medications    Infusions    PRN Medications       Past Medical History  Diagnosis Date  . Hypertension   . Diabetes mellitus   . Tachy-brady syndrome     s/p pacer 07/2009  . Atrial fibrillation     flecainide; coumadin  . Atrial flutter     November, 2010, flecainide, Coumadin  . Chronic diastolic heart failure     echo 10/10: mild LVH, EF 55-60%, mild AI, mild MR, mild to mod LAE, mild RVE, severe RAE, mod TR, PASP 53;  Echo 3/14:  Mild LVH, EF 55%, Tr AI, MAC, mild MR, mod LAE, PASP 31, trivial eff.  . Shortness of breath     myoview 11/10: EF 65%, no ischemia  . Pulmonary HTN     53 mmHg echo, 2010, moderate TR, mild right ventricular enlargement  . DVT (deep venous thrombosis)     Pulmonary embolus  . Pulmonary embolus 2006    2006, with DVT  . Carotid artery disease     [id number]: RICA [id number], LICA [id number]  . Thyroid nodule     non-neoplastic goiter  . Breast cancer   . Ejection fraction     EF 60%, echo, October, 2010  . Warfarin anticoagulation     Atrial fibrillation  . Pacemaker     November, 2010, Dr Rayann Heman - MDT ADDRL1 Adapta DC PPM ser #: [id number]    . S/P  laminectomy     June 22, 2011  . Syncope     2010, treated with pacemaker  . Drug therapy     FLECAINIDE  . Mitral regurgitation     mild, echo, October, 2010  . Mediastinal lymphadenopathy   . Venous insufficiency     Chronic  . Hypokalemia     October, 2012, potassium dose increased  . Hiatal hernia 2006    EGD  . Acute gastric ulcer without mention of hemorrhage, perforation, or obstruction 2006    EGD  . Esophagitis, unspecified 2006    EGD  . Allergy     seasonal  . Diverticulosis   . Hx of adenomatous colonic polyps   . Fecal incontinence   . Breast cancer   . Osteopenia     Past Surgical History  Procedure Laterality Date  . Breast surgery      Left  . Abdominal hysterectomy    . Back surgery      1973, 2012   . Ankle surgery      Family History  Problem Relation Age of Onset  . Heart disease Mother   . COPD Father   . Colon cancer Neg  Hx     Social History [name] [name] reports that she quit smoking about 15 years ago. She has never used smokeless tobacco. [name] [name] reports that she does not drink alcohol.  Review of Systems Patient denies fever, chills, headache, sweats, rash, change in vision, change in hearing, chest pain, cough, nausea or vomiting, urinary symptoms. All other systems are reviewed and are negative.  Physical Examination Temp:  [97.4 F (36.3 C)] 97.4 F (36.3 C) (11/28 0757) Pulse Rate:  [59-67] 67 (11/28 1115) Resp:  [11-21] 21 (11/28 1115) BP: (126-168)/(60-80) 145/61 mmHg (11/28 1115) SpO2:  [95 %-100 %] 97 % (11/28 1115) No intake or output data in the 24 hours ending 08/27/14 1134  Patient is overweight but stable. Her daughter is in the room. She is oriented to person time and place. Affect is normal. Head is atraumatic. Sclera and conjunctiva are normal. There is no jugular venous distention. Lungs reveal a few scattered rales. Cardiac exam reveals S1 and S2. There is no respiratory distress. The abdomen is soft.  There is 1+ peripheral edema area there are no musculoskeletal deformities. There are no skin rashes.   Lab Results  Basic Metabolic Panel:  Recent Labs Lab 08/27/14 0826  NA 140  K 3.5*  CL 99  GLUCOSE 148*  BUN 17  CREATININE 1.30*    Liver Function Tests: No results for input(s): AST, ALT, ALKPHOS, BILITOT, PROT, ALBUMIN in the last 168 hours.  CBC:  Recent Labs Lab 08/27/14 0826  HGB 14.3  HCT 42.0    Cardiac Enzymes: No results for input(s): CKTOTAL, CKMB, CKMBINDEX, TROPONINI in the last 168 hours.  BNP: Invalid input(s): POCBNP   Radiology Dg Chest Portable 1 View  08/27/2014   CLINICAL DATA:  Shortness of breath and severe left arm pain, onset this morning.  EXAM: PORTABLE CHEST - 1 VIEW  COMPARISON:  03/20/2013  FINDINGS: Heart size and pulmonary vascularity are normal. Dual lead pacemaker in place. Large hiatal hernia, unchanged. No infiltrates or effusions. Old healed right posterior rib fracture. No acute osseous abnormality.  Previous left mastectomy.  IMPRESSION: No acute abnormalities.  Large hiatal hernia.   Electronically Signed   By: Rozetta Nunnery M.D.   On: 08/27/2014 08:59    Prior Cardiac Testing/Procedures:   ECG  EKG reveals sinus rhythm. It is possible that there is limb lead reversal. Repeat EKG is to be done. There is no acute change.   Impression and Recommendations    Warfarin anticoagulation    Warfarin will be continued for her atrial arrhythmias.    Pacemaker-[employer]tronic     Her pacemaker is been followed over time and works well.    Acute dyspnea     This is the main problem today. The exact etiology is not clear. There is no evidence of definite cardiac ischemia. She is anticoagulated making the likelihood of a pulmonary embolus very small. I do believe there is some mild volume overload. We will diurese her and follow her carefully.    Paroxysmal atrial fibrillation      Her rhythm is stable on her flecainide.    Acute on  chronic diastolic CHF (congestive heart failure)     I believe that her current shortness of breath probably is acute on chronic diastolic CHF. We will diurese her a small amount and see how she feels.  She and her daughter also mentioned that she has had a problem with some unsteady gait. We'll have to see how she ambulates  especially after diuresing a little bit.  Daryel November, MD 08/27/2014, 11:34 AM

## 2014-08-27 NOTE — ED Notes (Signed)
MD at the bedside  

## 2014-08-27 NOTE — ED Provider Notes (Signed)
CSN: [id number]     Arrival date & time 08/27/14  [id number] History   First MD Initiated Contact with Patient 08/27/14 0754     Chief Complaint  Patient presents with  . Shortness of Breath  . Arm Pain      HPI Per EMS, pt began having SOB and left arm pain while making breakfast this morning. Pt initially reported left arm pain 10 out of 10. Pt received 1 nitro en route which brought her pain to a 0. NAD at this time. Pt reports mild SOB at this time. Pt alert x 4. Pt reports she had a similar episode yesterday.  Past Medical History  Diagnosis Date  . Hypertension   . Diabetes mellitus   . Tachy-brady syndrome     s/p pacer 07/2009  . Atrial fibrillation     flecainide; coumadin  . Atrial flutter     November, 2010, flecainide, Coumadin  . Chronic diastolic heart failure     echo 10/10: mild LVH, EF 55-60%, mild AI, mild MR, mild to mod LAE, mild RVE, severe RAE, mod TR, PASP 53;  Echo 3/14:  Mild LVH, EF 55%, Tr AI, MAC, mild MR, mod LAE, PASP 31, trivial eff.  . Shortness of breath     myoview 11/10: EF 65%, no ischemia  . Pulmonary HTN     53 mmHg echo, 2010, moderate TR, mild right ventricular enlargement  . DVT (deep venous thrombosis)     Pulmonary embolus  . Pulmonary embolus 2006    2006, with DVT  . Carotid artery disease     [id number]: RICA [id number], LICA [id number]  . Thyroid nodule     non-neoplastic goiter  . Breast cancer   . Ejection fraction     EF 60%, echo, October, 2010  . Warfarin anticoagulation     Atrial fibrillation  . Pacemaker     November, 2010, Dr Rayann Heman - MDT ADDRL1 Adapta DC PPM ser #: [id number]    . S/P laminectomy     June 22, 2011  . Syncope     2010, treated with pacemaker  . Drug therapy     FLECAINIDE  . Mitral regurgitation     mild, echo, October, 2010  . Mediastinal lymphadenopathy   . Venous insufficiency     Chronic  . Hypokalemia     October, 2012, potassium dose increased  . Hiatal hernia 2006    EGD  . Acute gastric ulcer  without mention of hemorrhage, perforation, or obstruction 2006    EGD  . Esophagitis, unspecified 2006    EGD  . Allergy     seasonal  . Diverticulosis   . Hx of adenomatous colonic polyps   . Fecal incontinence   . Breast cancer   . Osteopenia    Past Surgical History  Procedure Laterality Date  . Breast surgery      Left  . Abdominal hysterectomy    . Back surgery      1973, 2012   . Ankle surgery     Family History  Problem Relation Age of Onset  . Heart disease Mother   . COPD Father   . Colon cancer Neg Hx    History  Substance Use Topics  . Smoking status: Former Smoker -- 4.00 packs/day    Quit date: 10/26/1998  . Smokeless tobacco: Never Used  . Alcohol Use: No   OB History    No data available     Review of Systems  All other systems reviewed and are negative  Allergies  Morphine and related  Home Medications   Prior to Admission medications   Medication Sig Start Date End Date Taking? Authorizing Provider  allopurinol (ZYLOPRIM) 300 MG tablet Take 150 mg by mouth daily as needed (pain in fingers/toes.).  07/04/14  Yes Historical Provider, MD  anastrozole (ARIMIDEX) 1 MG tablet Take 1 tablet (1 mg total) by mouth daily. 08/17/14  Yes Heath Lark, MD  bethanechol (URECHOLINE) 25 MG tablet Take 25 mg by mouth daily.   Yes Historical Provider, MD  dicyclomine (BENTYL) 10 MG capsule Take 10 mg by mouth 2 (two) times daily.   Yes Historical Provider, MD  flecainide (TAMBOCOR) 100 MG tablet Take 100 mg by mouth 2 (two) times daily.   Yes Historical Provider, MD  furosemide (LASIX) 40 MG tablet Take 40 mg by mouth 2 (two) times daily.   Yes Historical Provider, MD  gabapentin (NEURONTIN) 600 MG tablet Take 0.5 tablets (300 mg total) by mouth 2 (two) times daily. 08/04/14  Yes Venetia Maxon Rama, MD  metFORMIN (GLUCOPHAGE) 500 MG tablet Take 750 mg by mouth 2 (two) times daily with a meal.   Yes Historical Provider, MD  metoprolol succinate (TOPROL-XL) 25 MG 24  hr tablet TAKE 2 TABLETS (50 MG TOTAL) BY MOUTH DAILY. 07/26/14  Yes Carlena Bjornstad, MD  potassium chloride 20 MEQ/15ML (10%) solution Take 20 mEq by mouth daily.    Yes Historical Provider, MD  pyridoxine (B-6) 100 MG tablet Take 200 mg by mouth daily.    Yes Historical Provider, MD  vitamin B-12 (CYANOCOBALAMIN) 500 MCG tablet Take 500 mcg by mouth daily.    Yes Historical Provider, MD  warfarin (COUMADIN) 5 MG tablet Take 2.5-5 mg by mouth daily at 6 PM. Pt takes 5 mg on mon, thu and 2.5 mg all other days   Yes Historical Provider, MD   BP 115/65 mmHg  Pulse 62  Temp(Src) 97.4 F (36.3 C) (Oral)  Resp 18  Ht 6' (1.829 m)  Wt 211 lb 4.8 oz (95.845 kg)  BMI 28.65 kg/m2  SpO2 96% Physical Exam Physical Exam  Nursing note and vitals reviewed. Constitutional: She is oriented to person, place, and time. She appears well-developed and well-nourished. No distress.  HENT:  Head: Normocephalic and atraumatic.  Eyes: Pupils are equal, round, and reactive to light.  Neck: Normal range of motion.  Cardiovascular: Normal rate and intact distal pulses.   Pulmonary/Chest: No respiratory distress.  Abdominal: Normal appearance. She exhibits no distension.  Musculoskeletal: Normal range of motion.  Neurological: She is alert and oriented to person, place, and time. No cranial nerve deficit.  Skin: Skin is warm and dry. No rash noted.    ED Course  Procedures (including critical care time) Labs Review Labs Reviewed  PRO B NATRIURETIC PEPTIDE - Abnormal; Notable for the following:    Pro B Natriuretic peptide (BNP) 825.8 (*)    All other components within normal limits  PROTIME-INR - Abnormal; Notable for the following:    Prothrombin Time 25.7 (*)    INR 2.32 (*)    All other components within normal limits  GLUCOSE, CAPILLARY - Abnormal; Notable for the following:    Glucose-Capillary 101 (*)    All other components within normal limits  URINALYSIS, ROUTINE W REFLEX MICROSCOPIC -  Abnormal; Notable for the following:    Leukocytes, UA MODERATE (*)    All other components within normal limits  COMPREHENSIVE METABOLIC PANEL -  Abnormal; Notable for the following:    Potassium 3.4 (*)    Glucose, Bld 152 (*)    Creatinine, Ser 1.40 (*)    Albumin 3.0 (*)    GFR calc non Af Amer 34 (*)    GFR calc Af Amer 40 (*)    Anion gap 17 (*)    All other components within normal limits  GLUCOSE, CAPILLARY - Abnormal; Notable for the following:    Glucose-Capillary 139 (*)    All other components within normal limits  GLUCOSE, CAPILLARY - Abnormal; Notable for the following:    Glucose-Capillary 104 (*)    All other components within normal limits  GLUCOSE, CAPILLARY - Abnormal; Notable for the following:    Glucose-Capillary 153 (*)    All other components within normal limits  I-STAT CHEM 8, ED - Abnormal; Notable for the following:    Potassium 3.5 (*)    Creatinine, Ser 1.30 (*)    Glucose, Bld 148 (*)    Calcium, Ion 1.11 (*)    All other components within normal limits  TROPONIN I  TROPONIN I  TROPONIN I  CBC WITH DIFFERENTIAL  URINE MICROSCOPIC-ADD ON  PROTIME-INR  I-STAT TROPOININ, ED    Imaging Review Dg Chest Portable 1 View  08/27/2014   CLINICAL DATA:  Shortness of breath and severe left arm pain, onset this morning.  EXAM: PORTABLE CHEST - 1 VIEW  COMPARISON:  03/20/2013  FINDINGS: Heart size and pulmonary vascularity are normal. Dual lead pacemaker in place. Large hiatal hernia, unchanged. No infiltrates or effusions. Old healed right posterior rib fracture. No acute osseous abnormality.  Previous left mastectomy.  IMPRESSION: No acute abnormalities.  Large hiatal hernia.   Electronically Signed   By: Rozetta Nunnery M.D.   On: 08/27/2014 08:59     EKG Interpretation   Date/Time:  Saturday August 27 2014 07:55:57 EST Ventricular Rate:  62 PR Interval:    QRS Duration: 106 QT Interval:  476 QTC Calculation: 483 R Axis:   159 Text Interpretation:   Age not entered, assumed to be  80 years old for  purpose of ECG interpretation Atrial fibrillation Probable lateral  infarct, age indeterminate Confirmed by Pharoah Goggins  MD, Yoseph Haile [id number]) on  08/27/2014 8:19:26 AM     Patient was seen and admitted by cardiology. MDM   Final diagnoses:  SOB (shortness of breath)        Dot Lanes, MD 08/28/14 [phone]

## 2014-08-28 DIAGNOSIS — R42 Dizziness and giddiness: Secondary | ICD-10-CM

## 2014-08-28 DIAGNOSIS — R0600 Dyspnea, unspecified: Secondary | ICD-10-CM

## 2014-08-28 LAB — COMPREHENSIVE METABOLIC PANEL
ALT: 10 U/L (ref 0–35)
AST: 17 U/L (ref 0–37)
Albumin: 3 g/dL — ABNORMAL LOW (ref 3.5–5.2)
Alkaline Phosphatase: 92 U/L (ref 39–117)
Anion gap: 17 — ABNORMAL HIGH (ref 5–15)
BUN: 21 mg/dL (ref 6–23)
CALCIUM: 9.2 mg/dL (ref 8.4–10.5)
CO2: 27 meq/L (ref 19–32)
CREATININE: 1.4 mg/dL — AB (ref 0.50–1.10)
Chloride: 99 mEq/L (ref 96–112)
GFR calc Af Amer: 40 mL/min — ABNORMAL LOW (ref >90)
GFR calc non Af Amer: 34 mL/min — ABNORMAL LOW (ref >90)
Glucose, Bld: 152 mg/dL — ABNORMAL HIGH (ref 70–99)
Potassium: 3.4 mEq/L — ABNORMAL LOW (ref 3.7–5.3)
SODIUM: 143 meq/L (ref 137–147)
Total Bilirubin: 0.5 mg/dL (ref 0.3–1.2)
Total Protein: 6.5 g/dL (ref 6.0–8.3)

## 2014-08-28 LAB — GLUCOSE, CAPILLARY
GLUCOSE-CAPILLARY: 153 mg/dL — AB (ref 70–99)
Glucose-Capillary: 102 mg/dL — ABNORMAL HIGH (ref 70–99)

## 2014-08-28 LAB — PROTIME-INR
INR: 2.39 — ABNORMAL HIGH (ref 0.00–1.49)
Prothrombin Time: 26.3 seconds — ABNORMAL HIGH (ref 11.6–15.2)

## 2014-08-28 LAB — TROPONIN I

## 2014-08-28 MED ORDER — METOPROLOL SUCCINATE ER 25 MG PO TB24: ORAL_TABLET | ORAL | Status: DC

## 2014-08-28 MED ORDER — METOPROLOL SUCCINATE ER 25 MG PO TB24: 12.5000 mg | ORAL_TABLET | Freq: Every day | ORAL | Status: DC

## 2014-08-28 NOTE — Progress Notes (Signed)
Patient ID: [name] [name], female   DOB: Oct 02, 1930, 80 y.o.   MRN: [id number]    SUBJECTIVE:  Patient is feeling better today. She diuresed a little bit yesterday. We cannot be too aggressive with her diuresis. Her blood pressure is on the low side today. She is not on a large amount of medicines for blood pressure. I will plan to lower her metoprolol dose. She does have some mild dizziness when she stands up rapidly.   Filed Vitals:   08/27/14 2115 08/28/14 0120 08/28/14 0620 08/28/14 0836  BP: 121/55 106/56 115/65 93/53  Pulse: 60 62 62 63  Temp: 98.3 F (36.8 C) 98.1 F (36.7 C) 97.4 F (36.3 C)   TempSrc: Oral Oral Oral   Resp: 18 18 18    Height:      Weight:   211 lb 4.8 oz (95.845 kg)   SpO2: 93% 97% 96% 96%     Intake/Output Summary (Last 24 hours) at 08/28/14 0959 Last data filed at 08/28/14 0838  Gross per 24 hour  Intake    420 ml  Output    950 ml  Net   -530 ml    LABS: Basic Metabolic Panel:  Recent Labs  08/27/14 0826 08/28/14 0305  NA 140 143  K 3.5* 3.4*  CL 99 99  CO2  --  27  GLUCOSE 148* 152*  BUN 17 21  CREATININE 1.30* 1.40*  CALCIUM  --  9.2   Liver Function Tests:  Recent Labs  08/28/14 0305  AST 17  ALT 10  ALKPHOS 92  BILITOT 0.5  PROT 6.5  ALBUMIN 3.0*   No results for input(s): LIPASE, AMYLASE in the last 72 hours. CBC:  Recent Labs  08/27/14 0826 08/27/14 1310  WBC  --  4.9  NEUTROABS  --  3.5  HGB 14.3 12.9  HCT 42.0 39.0  MCV  --  90.9  PLT  --  263   Cardiac Enzymes:  Recent Labs  08/27/14 1310 08/27/14 1855 08/27/14 2348  TROPONINI <0.30 <0.30 <0.30   BNP: Invalid input(s): POCBNP D-Dimer: No results for input(s): DDIMER in the last 72 hours. Hemoglobin A1C: No results for input(s): HGBA1C in the last 72 hours. Fasting Lipid Panel: No results for input(s): CHOL, HDL, LDLCALC, TRIG, CHOLHDL, LDLDIRECT in the last 72 hours. Thyroid Function Tests: No results for input(s): TSH, T4TOTAL, T3FREE,  THYROIDAB in the last 72 hours.  Invalid input(s): FREET3  RADIOLOGY: Ct Abdomen Pelvis W Contrast  08/02/2014   CLINICAL DATA:  Diarrhea. History of breast cancer and left mastectomy.  EXAM: CT ABDOMEN AND PELVIS WITH CONTRAST  TECHNIQUE: Multidetector CT imaging of the abdomen and pelvis was performed using the standard protocol following bolus administration of intravenous contrast.  CONTRAST:  2mL OMNIPAQUE IOHEXOL 300 MG/ML SOLN, 71mL OMNIPAQUE IOHEXOL 300 MG/ML SOLN  COMPARISON:  07/24/2014  FINDINGS: Again noted is a large hiatal hernia. Dependent atelectasis at the lung bases. No evidence for free intraperitoneal air.  There are cardiac pacer leads. Subtle density within the gallbladder could represent sludge. No evidence for gallbladder inflammation. Normal appearance of the liver and portal venous system. There is fatty infiltration of the pancreas. Again noted are multiple subtle low density structures throughout the spleen. These low-density structure are less conspicuous on the delayed images. Normal appearance of the adrenal glands. Normal appearance of the kidneys with right extrarenal pelvis. No evidence for hydronephrosis. There is focal dilatation or a bulge in the infrarenal abdominal aorta along the  posterior aortic wall. Overall size of the abdominal aorta at the level measures 2.7 cm. This focal bulge is unchanged from a spine CT on 06/17/2011. Otherwise, the abdominal aorta is heavily calcified and there are calcifications involving the visceral arteries.  Uterus has been removed. Extensive diverticulosis in the sigmoid colon. There is a left inguinal hernia which contains a small portion of the colon at the junction of the sigmoid colon and descending colon. No evidence for inflammation in this left inguinal hernia. Diverticulosis involving the descending colon. No acute abnormality in the small bowel. A right posterior abdominal wall hernia containing fat.  Multilevel degenerative  facet disease in the lumbar spine. No acute bone abnormality.  IMPRESSION: No acute abnormalities involving the abdomen or pelvis.  Left inguinal hernia contains a small amount of colon. There is no acute inflammation in this area.  Colonic diverticulosis without acute colonic inflammation.  Small aneurysm or focal bulge along the posterior abdominal aorta. This appears unchanged since 2012.  Multiple small hypodense structures in the spleen are indeterminate. These are less conspicuous on the delayed images and could be related to hemangiomas. This may be better characterized with MRI.   Electronically Signed   By: Markus Daft M.D.   On: 08/02/2014 20:10   Dg Chest Portable 1 View  08/27/2014   CLINICAL DATA:  Shortness of breath and severe left arm pain, onset this morning.  EXAM: PORTABLE CHEST - 1 VIEW  COMPARISON:  03/20/2013  FINDINGS: Heart size and pulmonary vascularity are normal. Dual lead pacemaker in place. Large hiatal hernia, unchanged. No infiltrates or effusions. Old healed right posterior rib fracture. No acute osseous abnormality.  Previous left mastectomy.  IMPRESSION: No acute abnormalities.  Large hiatal hernia.   Electronically Signed   By: Rozetta Nunnery M.D.   On: 08/27/2014 08:59    PHYSICAL EXAM   patient is oriented to person time and place. Affect is normal. Head is atraumatic. Sclerae and conjunctivae are normal. There is no jugulovenous distention. Lungs are clear. Respiratory effort is nonlabored. Cardiac exam reveals S1 and S2. Abdomen is soft. Her peripheral edema is now gone.   TELEMETRY:   I reviewed telemetry. There is normal sinus rhythm. There is atrial pacing.   ASSESSMENT    Warfarin anticoagulation    Coumadin is continued.    Pacemaker-[employer]    Acute dyspnea     I think that her shortness of breath several days ago and yesterday was due to mild volume overload. I have pushed her diuresis a little bit. No further workup.    Paroxysmal atrial  fibrillation        Holding sinus rhythm.    Dizziness     She is having mild dizziness at times with standing. This makes it difficult relative to her volume status. I will lower her metoprolol dose to 12.5 mg daily. We will not push her diuretics any higher.  Overall the patient is stable. I feel that her shortness of breath was from mild fluid overload. She is diuresis. She will go home on the same dose of diuretics that she was on before. She'll be a little bit more careful with her salt and fluid intake. I will see her or my team see her within one week after hospitalization to reassess her overall status. She feels she may be having some symptoms from gabapentin. I told her to hold it at this time.  Dola Argyle 08/28/2014 9:59 AM

## 2014-08-28 NOTE — Discharge Summary (Signed)
Discharge Summary   Patient ID: [name] [name],  MRN: [id number], DOB/AGE: 11/11/1930 80 y.o.  Admit date: 08/27/2014 Discharge date: 08/28/2014  Primary Care Provider: Foye Spurling Primary Cardiologist: Veatrice Bourbon, MD   Discharge Diagnoses Principal Problem:   Acute on chronic diastolic CHF (congestive heart failure), NYHA class 2  **Discharge weight of 211 lbs.  Active Problems:   Acute dyspnea   Tachy-brady syndrome s/p Pacemaker-Medtronic   Hypertension   Paroxysmal atrial fibrillation  **Chronic flecainide and coumadin   Dizziness   Diabetes mellitus    Allergies Allergies  Allergen Reactions  . Morphine And Related Other (See Comments)    Family requests no morphine due to past reaction   Procedures  None.  History of Present Illness  80 year old female with a prior history of paroxysmal atrial fibrillation and chronic diastolic congestive heart failure who was in her usual state of health until November 27, Thanksgiving day. She developed dyspnea on exertion and also noted mild edema and discomfort in her arm. She presented to the [facility] ED on November 28, where ECG was non-acute and troponin was normal. Chest x-ray showed no evidence of heart failure however she was felt to be mildly volume overloaded. She was treated with one dose of intravenous Lasix in the emergency department and admitted for further evaluation.  Hospital Course  Patient ruled out for myocardial infarction. Dyspnea improved significantly following IV diuresis. Her weight came down to 211 pounds this morning.  She will be discharged home today in good condition on her previous home dose of Lasix. She's been counseled on the importance of sodium restriction and daily weight monitoring.  Of note, her metoprolol dose was reduced to 12.5 mg daily in the setting of complaints of mild dizziness and orthostasis.  Pt also felt that her gabapentin may be making her dizzy and this has been placed on  hold.  Discharge Vitals Blood pressure 112/64, pulse 64, temperature 97.4 F (36.3 C), temperature source Oral, resp. rate 18, height 6' (1.829 m), weight 211 lb 4.8 oz (95.845 kg), SpO2 96 %.  Filed Weights   08/27/14 1200 08/28/14 0620  Weight: 212 lb (96.163 kg) 211 lb 4.8 oz (95.845 kg)    Labs  CBC  Recent Labs  08/27/14 0826 08/27/14 1310  WBC  --  4.9  NEUTROABS  --  3.5  HGB 14.3 12.9  HCT 42.0 39.0  MCV  --  90.9  PLT  --  99991111   Basic Metabolic Panel  Recent Labs  08/27/14 0826 08/28/14 0305  NA 140 143  K 3.5* 3.4*  CL 99 99  CO2  --  27  GLUCOSE 148* 152*  BUN 17 21  CREATININE 1.30* 1.40*  CALCIUM  --  9.2   Liver Function Tests  Recent Labs  08/28/14 0305  AST 17  ALT 10  ALKPHOS 92  BILITOT 0.5  PROT 6.5  ALBUMIN 3.0*   Cardiac Enzymes  Recent Labs  08/27/14 1310 08/27/14 1855 08/27/14 2348  TROPONINI <0.30 <0.30 <0.30   Disposition  Pt is being discharged home today in good condition.  Follow-up Plans & Appointments      Follow-up Information    Follow up with Dola Argyle, MD On 11/17/2014.   Specialty:  Cardiology   Why:  8:00 AM   Contact information:   [id number] [address] [phone]       Follow up with Dola Argyle, MD In 1 week.   Specialty:  Cardiology   Why:  we will arrange for follow-up and contact you.   Contact information:   [id number] [place] [address] [phone]      Discharge Medications    Medication List    STOP taking these medications        gabapentin 600 MG tablet  Commonly known as:  NEURONTIN      TAKE these medications        allopurinol 300 MG tablet  Commonly known as:  ZYLOPRIM  Take 150 mg by mouth daily as needed (pain in fingers/toes.).     anastrozole 1 MG tablet  Commonly known as:  ARIMIDEX  Take 1 tablet (1 mg total) by mouth daily.     bethanechol 25 MG tablet  Commonly known as:  URECHOLINE  Take  25 mg by mouth daily.     dicyclomine 10 MG capsule  Commonly known as:  BENTYL  Take 10 mg by mouth 2 (two) times daily.     flecainide 100 MG tablet  Commonly known as:  TAMBOCOR  Take 100 mg by mouth 2 (two) times daily.     furosemide 40 MG tablet  Commonly known as:  LASIX  Take 40 mg by mouth 2 (two) times daily.     metFORMIN 500 MG tablet  Commonly known as:  GLUCOPHAGE  Take 750 mg by mouth 2 (two) times daily with a meal.     metoprolol succinate 25 MG 24 hr tablet  Commonly known as:  TOPROL-XL  Take 0.5 tablets (12.5 mg total) by mouth daily.     potassium chloride 20 MEQ/15ML (10%) solution  Take 20 mEq by mouth daily.     pyridoxine 100 MG tablet  Commonly known as:  B-6  Take 200 mg by mouth daily.     vitamin B-12 500 MCG tablet  Commonly known as:  CYANOCOBALAMIN  Take 500 mcg by mouth daily.     warfarin 5 MG tablet  Commonly known as:  COUMADIN  Take 2.5-5 mg by mouth daily at 6 PM. Pt takes 5 mg on mon, thu and 2.5 mg all other days       Outstanding Labs/Studies  None  Duration of Discharge Encounter   Greater than 30 minutes including physician time.  Signed, Murray Hodgkins NP 08/28/2014, 12:29 PM Patient seen and examined. I agree with the assessment and plan as detailed above. See also my additional thoughts below.   I discussed follow-up with the patient. I made decision for her to go home. I agree with the discharge summary as completed above.  Dola Argyle, MD, [facility] 08/29/2014 7:10 AM

## 2014-08-29 NOTE — Care Management Note (Signed)
    Page 1 of 1   08/29/2014     3:51:37 PM CARE MANAGEMENT NOTE 08/29/2014  Patient:  Wolford,Cozette   Account Number:  [id number]  Date Initiated:  08/29/2014  Documentation initiated by:  Mariann Laster  Subjective/Objective Assessment:   dizziness, SOB, Left arm pain, r/o MI, mild volume overload     Action/Plan:   CM to follow for disposition needs   Anticipated DC Date:  08/28/2014   Anticipated DC Plan:  HOME/SELF CARE         Choice offered to / List presented to:             Status of service:  Completed, signed off Medicare Important Message given?  NA - LOS <3 / Initial given by admissions (If response is "NO", the following Medicare IM given date fields will be blank) Date Medicare IM given:   Medicare IM given by:   Date Additional Medicare IM given:   Additional Medicare IM given by:    Discharge Disposition:  HOME/SELF CARE  Per UR Regulation:  Reviewed for med. necessity/level of care/duration of stay  If discussed at [place] of Stay Meetings, dates discussed:    Comments:  Demari Kropp RN, BSN, MSHL, CCM  Nurse - Case Manager,  (Unit [place])  [phone]  08/29/2014 weekend discharge. Dispo:  home / self care.

## 2014-09-06 ENCOUNTER — Other Ambulatory Visit: Payer: Self-pay | Admitting: Cardiology

## 2014-09-07 ENCOUNTER — Encounter: Payer: Self-pay | Admitting: Cardiology

## 2014-09-07 ENCOUNTER — Ambulatory Visit (INDEPENDENT_AMBULATORY_CARE_PROVIDER_SITE_OTHER): Payer: Medicare HMO | Admitting: Cardiology

## 2014-09-07 VITALS — BP 100/60 | HR 65 | Ht 72.0 in | Wt 216.8 lb

## 2014-09-07 DIAGNOSIS — Z79899 Other long term (current) drug therapy: Secondary | ICD-10-CM

## 2014-09-07 DIAGNOSIS — I5032 Chronic diastolic (congestive) heart failure: Secondary | ICD-10-CM | POA: Insufficient documentation

## 2014-09-07 DIAGNOSIS — I495 Sick sinus syndrome: Secondary | ICD-10-CM

## 2014-09-07 DIAGNOSIS — Z5181 Encounter for therapeutic drug level monitoring: Secondary | ICD-10-CM

## 2014-09-07 NOTE — Assessment & Plan Note (Signed)
The patient has had several different types of supraventricular tachycardia. She has a pacemaker placed for bradycardia. She did not have any of her arrhythmias during her very recent hospitalization. Flecainide level will be checked today.

## 2014-09-07 NOTE — Assessment & Plan Note (Signed)
There is a component of diastolic CHF. However I'm hesitant to over diuresis her. She says she has an intermittent sensation of shortness of breath. I believe there may be an anxiety component to this. I'm leaving her on her current dose of diuretics. Renal function will be checked today. At this point I am not convinced that she is having ischemia causing the symptoms.

## 2014-09-07 NOTE — Patient Instructions (Signed)
Your physician recommends that you continue on your current medications as directed. Please refer to the Current Medication list given to you today.  Your physician recommends that you return for lab work in: today Artist)   Your physician recommends that you schedule a follow-up appointment in: 6 weeks

## 2014-09-07 NOTE — Progress Notes (Signed)
Patient ID: [name] [name], female   DOB: Oct 26, 1930, 80 y.o.   MRN: [id number]    HPI Patient is seen today to follow-up supraventricular arrhythmias and recent hospitalization. Patient was recently hospitalized. She presented with some shortness of breath mild edema and discomfort in her arm. Chest x-ray did not show CHF. However clinically it was thought that she was mildly volume overloaded. She was treated with one dose of IV Lasix. She ruled out for myocardial infarction. Her metoprolol dose was reduced because of some dizziness and orthostasis. Also gabapentin was put on hold thinking that it might be making her feel poorly.  She is now here for follow-up. She is stable. It does seem that anxiety may be playing a role in some of her response to feeling short of breath at times.  Allergies  Allergen Reactions  . Morphine And Related Other (See Comments)    Family requests no morphine due to past reaction    Current Outpatient Prescriptions  Medication Sig Dispense Refill  . anastrozole (ARIMIDEX) 1 MG tablet Take 1 tablet (1 mg total) by mouth daily. 90 tablet 3  . bethanechol (URECHOLINE) 25 MG tablet Take 25 mg by mouth daily.    Marland Kitchen dicyclomine (BENTYL) 10 MG capsule Take 10 mg by mouth 2 (two) times daily.    . flecainide (TAMBOCOR) 100 MG tablet Take 100 mg by mouth 2 (two) times daily.    . flecainide (TAMBOCOR) 100 MG tablet TAKE 1 TABLET (100 MG TOTAL) BY MOUTH 2 (TWO) TIMES DAILY. 60 tablet 3  . furosemide (LASIX) 40 MG tablet Take 40 mg by mouth 2 (two) times daily.    . metFORMIN (GLUCOPHAGE) 500 MG tablet Take 750 mg by mouth 2 (two) times daily with a meal.    . metoprolol succinate (TOPROL-XL) 25 MG 24 hr tablet Take 0.5 tablets (12.5 mg total) by mouth daily. 60 tablet 3  . potassium chloride 20 MEQ/15ML (10%) solution Take 20 mEq by mouth daily.     Marland Kitchen pyridoxine (B-6) 100 MG tablet Take 200 mg by mouth daily.     . vitamin B-12 (CYANOCOBALAMIN) 500 MCG tablet Take 500  mcg by mouth daily.     Marland Kitchen warfarin (COUMADIN) 5 MG tablet Take 2.5-5 mg by mouth daily at 6 PM. Pt takes 5 mg on mon, thu and 2.5 mg all other days     No current facility-administered medications for this visit.    History   Social History  . Marital Status: Widowed    Spouse Name: N/A    Number of Children: 3  . Years of Education: N/A   Occupational History  . Retired     Social History Main Topics  . Smoking status: Former Smoker -- 4.00 packs/day    Quit date: 10/26/1998  . Smokeless tobacco: Never Used  . Alcohol Use: No  . Drug Use: No  . Sexual Activity: Not on file   Other Topics Concern  . Not on file   Social History Narrative   Widowed.  Lives in [place] by herself.  Avoids caffeine.  Very active, taking care of her brother who also lives by himself and is in his late 49's.    Family History  Problem Relation Age of Onset  . Heart disease Mother   . COPD Father   . Colon cancer Neg Hx     Past Medical History  Diagnosis Date  . Hypertension   . Diabetes mellitus   . Tachy-brady syndrome  s/p pacer 07/2009  . Atrial fibrillation     flecainide; coumadin  . Atrial flutter     November, 2010, flecainide, Coumadin  . Chronic diastolic heart failure     echo 10/10: mild LVH, EF 55-60%, mild AI, mild MR, mild to mod LAE, mild RVE, severe RAE, mod TR, PASP 53;  Echo 3/14:  Mild LVH, EF 55%, Tr AI, MAC, mild MR, mod LAE, PASP 31, trivial eff.  . Shortness of breath     myoview 11/10: EF 65%, no ischemia  . Pulmonary HTN     53 mmHg echo, 2010, moderate TR, mild right ventricular enlargement  . DVT (deep venous thrombosis)     Pulmonary embolus  . Pulmonary embolus 2006    2006, with DVT  . Carotid artery disease     [id number]: RICA [id number], LICA [id number]  . Thyroid nodule     non-neoplastic goiter  . Breast cancer   . Ejection fraction     EF 60%, echo, October, 2010  . Warfarin anticoagulation     Atrial fibrillation  . Pacemaker     November, 2010, Dr  Rayann Heman - MDT ADDRL1 Adapta DC PPM ser #: [id number]    . S/P laminectomy     June 22, 2011  . Syncope     2010, treated with pacemaker  . Drug therapy     FLECAINIDE  . Mitral regurgitation     mild, echo, October, 2010  . Mediastinal lymphadenopathy   . Venous insufficiency     Chronic  . Hypokalemia     October, 2012, potassium dose increased  . Hiatal hernia 2006    EGD  . Acute gastric ulcer without mention of hemorrhage, perforation, or obstruction 2006    EGD  . Esophagitis, unspecified 2006    EGD  . Allergy     seasonal  . Diverticulosis   . Hx of adenomatous colonic polyps   . Fecal incontinence   . Breast cancer   . Osteopenia     Past Surgical History  Procedure Laterality Date  . Breast surgery      Left  . Abdominal hysterectomy    . Back surgery      1973, 2012   . Ankle surgery      Patient Active Problem List   Diagnosis Date Noted  . Chronic diastolic CHF (congestive heart failure) 09/07/2014  . Dizziness 08/28/2014  . Paroxysmal atrial fibrillation 08/27/2014  . Diarrhea 08/03/2014  . Supratherapeutic INR   . Rectal bleeding   . Sick sinus syndrome 03/09/2014  . Encounter for therapeutic drug monitoring 11/04/2013  . Right leg swelling 07/19/2013  . Hypertension 06/21/2013  . Osteopenia   . Hypokalemia   . Tachy-brady syndrome   . Atrial fibrillation   . Atrial flutter   . Shortness of breath   . Pulmonary HTN   . DVT (deep venous thrombosis)   . Pulmonary embolus   . Carotid artery disease   . Breast cancer, left breast   . Ejection fraction   . Warfarin anticoagulation   . Pacemaker-Medtronic   . S/P laminectomy   . Syncope   . Drug therapy   . Mitral regurgitation   . Mediastinal lymphadenopathy   . Venous insufficiency   . Ulcer of toe 04/26/2011  . THYROID NODULE, HX OF 07/19/2009  . Diabetes mellitus 06/13/2008  . ALLERGIC RHINITIS 06/13/2008  . MEDIASTINAL LYMPHADENOPATHY 06/13/2008    ROS  Patient denies  fever, chills, headache, sweats,  rash, change in vision, change in hearing, chest pain, cough, nausea or vomiting, urinary symptoms. All other systems are reviewed and are negative.  PHYSICAL EXAM Patient is oriented to person time and place. Affect is normal. Head is atraumatic. Sclera and conjunctiva are normal. There is no jugulovenous distention. Lungs are clear. Respiratory effort is not labored. Cardiac exam reveals S1 and S2. The abdomen is soft. There is no significant peripheral edema. The patient is here with her daughter.  Filed Vitals:   09/07/14 1604  BP: 100/60  Pulse: 65  Height: 6' (1.829 m)  Weight: 216 lb 12.8 oz (98.34 kg)  SpO2: 97%     ASSESSMENT & PLAN

## 2014-09-08 LAB — BASIC METABOLIC PANEL
BUN: 21 mg/dL (ref 6–23)
CO2: 30 meq/L (ref 19–32)
CREATININE: 1.4 mg/dL — AB (ref 0.4–1.2)
Calcium: 9.6 mg/dL (ref 8.4–10.5)
Chloride: 98 mEq/L (ref 96–112)
GFR: 37.65 mL/min — ABNORMAL LOW (ref >60.00)
GLUCOSE: 120 mg/dL — AB (ref 70–99)
Potassium: 4 mEq/L (ref 3.5–5.1)
Sodium: 136 mEq/L (ref 135–145)

## 2014-09-13 LAB — FLECAINIDE LEVEL: Flecainide: 1.24 ug/mL (ref 0.20–1.00)

## 2014-09-14 ENCOUNTER — Telehealth: Payer: Self-pay | Admitting: *Deleted

## 2014-09-14 ENCOUNTER — Ambulatory Visit: Payer: Medicare HMO | Admitting: Cardiology

## 2014-09-14 DIAGNOSIS — I495 Sick sinus syndrome: Secondary | ICD-10-CM

## 2014-09-14 MED ORDER — FLECAINIDE ACETATE 100 MG PO TABS: 50.0000 mg | ORAL_TABLET | Freq: Two times a day (BID) | ORAL | Status: DC

## 2014-09-14 NOTE — Telephone Encounter (Signed)
Received call from [facility] called with flecainide results critical at 1.24.  Discussed with Dr. Ron Parker.  This was not drawn as a trough. Per Dr. Ron Parker patient should decrease dose to 50 mg twice daily and have trough drawn in 2 weeks.  Called patient and informed. She verbalizes understanding and agreement. Appointment made for 12/30 for flecainide trough.  Pt aware to NOT take her medication prior to lab appointment that day.

## 2014-09-20 ENCOUNTER — Ambulatory Visit (INDEPENDENT_AMBULATORY_CARE_PROVIDER_SITE_OTHER): Payer: Medicare HMO | Admitting: *Deleted

## 2014-09-20 DIAGNOSIS — Z5181 Encounter for therapeutic drug level monitoring: Secondary | ICD-10-CM

## 2014-09-20 DIAGNOSIS — Z7901 Long term (current) use of anticoagulants: Secondary | ICD-10-CM

## 2014-09-20 DIAGNOSIS — I482 Chronic atrial fibrillation, unspecified: Secondary | ICD-10-CM

## 2014-09-20 DIAGNOSIS — I4891 Unspecified atrial fibrillation: Secondary | ICD-10-CM

## 2014-09-20 DIAGNOSIS — I2699 Other pulmonary embolism without acute cor pulmonale: Secondary | ICD-10-CM

## 2014-09-20 LAB — POCT INR: INR: 2.9

## 2014-09-28 ENCOUNTER — Other Ambulatory Visit: Payer: Medicare HMO

## 2014-10-11 ENCOUNTER — Encounter: Payer: Self-pay | Admitting: *Deleted

## 2014-10-18 ENCOUNTER — Ambulatory Visit (INDEPENDENT_AMBULATORY_CARE_PROVIDER_SITE_OTHER): Payer: Medicare HMO | Admitting: *Deleted

## 2014-10-18 DIAGNOSIS — I482 Chronic atrial fibrillation, unspecified: Secondary | ICD-10-CM

## 2014-10-18 DIAGNOSIS — I4891 Unspecified atrial fibrillation: Secondary | ICD-10-CM

## 2014-10-18 DIAGNOSIS — Z5181 Encounter for therapeutic drug level monitoring: Secondary | ICD-10-CM

## 2014-10-18 DIAGNOSIS — Z7901 Long term (current) use of anticoagulants: Secondary | ICD-10-CM

## 2014-10-18 DIAGNOSIS — I2699 Other pulmonary embolism without acute cor pulmonale: Secondary | ICD-10-CM

## 2014-10-18 LAB — POCT INR: INR: 3.5

## 2014-10-21 ENCOUNTER — Emergency Department (HOSPITAL_COMMUNITY)
Admission: EM | Admit: 2014-10-21 | Discharge: 2014-10-21 | Disposition: A | Discharge status: Home / Self Care | Payer: Medicare HMO | Attending: Emergency Medicine | Admitting: Emergency Medicine

## 2014-10-21 ENCOUNTER — Encounter (HOSPITAL_COMMUNITY): Payer: Self-pay | Admitting: Emergency Medicine

## 2014-10-21 DIAGNOSIS — Z86711 Personal history of pulmonary embolism: Secondary | ICD-10-CM | POA: Diagnosis not present

## 2014-10-21 DIAGNOSIS — E119 Type 2 diabetes mellitus without complications: Secondary | ICD-10-CM | POA: Diagnosis not present

## 2014-10-21 DIAGNOSIS — Z7901 Long term (current) use of anticoagulants: Secondary | ICD-10-CM | POA: Diagnosis not present

## 2014-10-21 DIAGNOSIS — Z86718 Personal history of other venous thrombosis and embolism: Secondary | ICD-10-CM | POA: Insufficient documentation

## 2014-10-21 DIAGNOSIS — Z87891 Personal history of nicotine dependence: Secondary | ICD-10-CM | POA: Insufficient documentation

## 2014-10-21 DIAGNOSIS — Z950 Presence of cardiac pacemaker: Secondary | ICD-10-CM | POA: Diagnosis not present

## 2014-10-21 DIAGNOSIS — Z79899 Other long term (current) drug therapy: Secondary | ICD-10-CM | POA: Diagnosis not present

## 2014-10-21 DIAGNOSIS — N390 Urinary tract infection, site not specified: Secondary | ICD-10-CM | POA: Insufficient documentation

## 2014-10-21 DIAGNOSIS — R350 Frequency of micturition: Secondary | ICD-10-CM | POA: Diagnosis present

## 2014-10-21 DIAGNOSIS — K921 Melena: Secondary | ICD-10-CM | POA: Insufficient documentation

## 2014-10-21 DIAGNOSIS — I10 Essential (primary) hypertension: Secondary | ICD-10-CM | POA: Insufficient documentation

## 2014-10-21 DIAGNOSIS — Z8739 Personal history of other diseases of the musculoskeletal system and connective tissue: Secondary | ICD-10-CM | POA: Diagnosis not present

## 2014-10-21 DIAGNOSIS — I2510 Atherosclerotic heart disease of native coronary artery without angina pectoris: Secondary | ICD-10-CM | POA: Diagnosis not present

## 2014-10-21 DIAGNOSIS — Z853 Personal history of malignant neoplasm of breast: Secondary | ICD-10-CM | POA: Diagnosis not present

## 2014-10-21 DIAGNOSIS — Z86010 Personal history of colonic polyps: Secondary | ICD-10-CM | POA: Insufficient documentation

## 2014-10-21 DIAGNOSIS — I5032 Chronic diastolic (congestive) heart failure: Secondary | ICD-10-CM | POA: Insufficient documentation

## 2014-10-21 LAB — COMPREHENSIVE METABOLIC PANEL
ALBUMIN: 3.7 g/dL (ref 3.5–5.2)
ALT: 15 U/L (ref 0–35)
AST: 29 U/L (ref 0–37)
Alkaline Phosphatase: 116 U/L (ref 39–117)
Anion gap: 13 (ref 5–15)
BUN: 21 mg/dL (ref 6–23)
CALCIUM: 9.2 mg/dL (ref 8.4–10.5)
CO2: 24 mmol/L (ref 19–32)
Chloride: 100 mmol/L (ref 96–112)
Creatinine, Ser: 1.41 mg/dL — ABNORMAL HIGH (ref 0.50–1.10)
GFR calc Af Amer: 39 mL/min — ABNORMAL LOW (ref >90)
GFR calc non Af Amer: 34 mL/min — ABNORMAL LOW (ref >90)
Glucose, Bld: 219 mg/dL — ABNORMAL HIGH (ref 70–99)
Potassium: 3.7 mmol/L (ref 3.5–5.1)
Sodium: 137 mmol/L (ref 135–145)
Total Bilirubin: 1 mg/dL (ref 0.3–1.2)
Total Protein: 7.5 g/dL (ref 6.0–8.3)

## 2014-10-21 LAB — URINALYSIS, ROUTINE W REFLEX MICROSCOPIC
BILIRUBIN URINE: NEGATIVE
Glucose, UA: NEGATIVE mg/dL
Ketones, ur: NEGATIVE mg/dL
Nitrite: NEGATIVE
PROTEIN: NEGATIVE mg/dL
SPECIFIC GRAVITY, URINE: 1.007 (ref 1.005–1.030)
Urobilinogen, UA: 0.2 mg/dL (ref 0.0–1.0)
pH: 6.5 (ref 5.0–8.0)

## 2014-10-21 LAB — CBC WITH DIFFERENTIAL/PLATELET
BASOS ABS: 0.1 10*3/uL (ref 0.0–0.1)
Basophils Relative: 1 % (ref 0–1)
EOS ABS: 0.1 10*3/uL (ref 0.0–0.7)
Eosinophils Relative: 2 % (ref 0–5)
HCT: 35.3 % — ABNORMAL LOW (ref 36.0–46.0)
HEMOGLOBIN: 11.5 g/dL — AB (ref 12.0–15.0)
LYMPHS ABS: 0.6 10*3/uL — AB (ref 0.7–4.0)
Lymphocytes Relative: 12 % (ref 12–46)
MCH: 30.3 pg (ref 26.0–34.0)
MCHC: 32.6 g/dL (ref 30.0–36.0)
MCV: 92.9 fL (ref 78.0–100.0)
MONO ABS: 0.4 10*3/uL (ref 0.1–1.0)
Monocytes Relative: 7 % (ref 3–12)
NEUTROS ABS: 4.1 10*3/uL (ref 1.7–7.7)
NEUTROS PCT: 78 % — AB (ref 43–77)
Platelets: 266 10*3/uL (ref 150–400)
RBC: 3.8 MIL/uL — ABNORMAL LOW (ref 3.87–5.11)
RDW: 14.4 % (ref 11.5–15.5)
WBC: 5.2 10*3/uL (ref 4.0–10.5)

## 2014-10-21 LAB — URINE MICROSCOPIC-ADD ON

## 2014-10-21 LAB — SAMPLE TO BLOOD BANK

## 2014-10-21 LAB — PROTIME-INR
INR: 1.98 — AB (ref 0.00–1.49)
Prothrombin Time: 22.7 seconds — ABNORMAL HIGH (ref 11.6–15.2)

## 2014-10-21 LAB — POC OCCULT BLOOD, ED: FECAL OCCULT BLD: NEGATIVE

## 2014-10-21 MED ORDER — CEPHALEXIN 500 MG PO CAPS
500.0000 mg | ORAL_CAPSULE | Freq: Once | ORAL | Status: AC
  Administered 2014-10-21: 500 mg via ORAL
  Filled 2014-10-21: qty 1

## 2014-10-21 MED ORDER — SODIUM CHLORIDE 0.9 % IV SOLN
INTRAVENOUS | Status: DC
  Administered 2014-10-21: 125 mL/h via INTRAVENOUS

## 2014-10-21 MED ORDER — ACETAMINOPHEN 325 MG PO TABS
650.0000 mg | ORAL_TABLET | Freq: Once | ORAL | Status: AC
  Administered 2014-10-21: 650 mg via ORAL
  Filled 2014-10-21: qty 2

## 2014-10-21 MED ORDER — CEPHALEXIN 500 MG PO CAPS: 500.0000 mg | ORAL_CAPSULE | Freq: Once | ORAL | Status: DC

## 2014-10-21 NOTE — ED Notes (Signed)
Bed: [id number] Expected date:  Expected time:  Means of arrival:  Comments: EMS- elderly, abdominal pain, tarry stools, uti?

## 2014-10-21 NOTE — ED Provider Notes (Signed)
CSN: [id number]     Arrival date & time 10/21/14  0944 History   First MD Initiated Contact with Patient 10/21/14 [phone]     Chief Complaint  Patient presents with  . Abdominal Pain  . Melena  . Urinary Frequency     (Consider location/radiation/quality/duration/timing/severity/associated sxs/prior Treatment) HPI   [name] [name] is a 81 y.o. female who presents for evaluation of urinary frequency, dysuria, urgency, and dark colored stools.  Urine problem has been going on for several days.  She is taking Macrodantin, [facility]  Today she had a bowel movement, and noticed it was black in color.  This concerned her, so she came here for evaluation by EMS.  She was able to eat this morning and had no problems with that.  She denies fever, chills, nausea, vomiting, weakness or dizziness.  She is concerned about a reddened area on her left medial ankle, which she noticed this morning.  She denies leg pain.  She is taking her usual medications, without relief.  There are no other known modifying factors.   Past Medical History  Diagnosis Date  . Hypertension   . Diabetes mellitus   . Tachy-brady syndrome     s/p pacer 07/2009  . Atrial fibrillation     flecainide; coumadin  . Atrial flutter     November, 2010, flecainide, Coumadin  . Chronic diastolic heart failure     echo 10/10: mild LVH, EF 55-60%, mild AI, mild MR, mild to mod LAE, mild RVE, severe RAE, mod TR, PASP 53;  Echo 3/14:  Mild LVH, EF 55%, Tr AI, MAC, mild MR, mod LAE, PASP 31, trivial eff.  . Shortness of breath     myoview 11/10: EF 65%, no ischemia  . Pulmonary HTN     53 mmHg echo, 2010, moderate TR, mild right ventricular enlargement  . DVT (deep venous thrombosis)     Pulmonary embolus  . Pulmonary embolus 2006    2006, with DVT  . Carotid artery disease     [id number]: RICA [id number], LICA [id number]  . Thyroid nodule     non-neoplastic goiter  . Breast cancer   . Ejection fraction      EF 60%, echo, October, 2010  . Warfarin anticoagulation     Atrial fibrillation  . Pacemaker     November, 2010, Dr Rayann Heman - MDT ADDRL1 Adapta DC PPM ser #: [id number]    . S/P laminectomy     June 22, 2011  . Syncope     2010, treated with pacemaker  . Drug therapy     FLECAINIDE  . Mitral regurgitation     mild, echo, October, 2010  . Mediastinal lymphadenopathy   . Venous insufficiency     Chronic  . Hypokalemia     October, 2012, potassium dose increased  . Hiatal hernia 2006    EGD  . Acute gastric ulcer without mention of hemorrhage, perforation, or obstruction 2006    EGD  . Esophagitis, unspecified 2006    EGD  . Allergy     seasonal  . Diverticulosis   . Hx of adenomatous colonic polyps   . Fecal incontinence   . Breast cancer   . Osteopenia    Past Surgical History  Procedure Laterality Date  . Breast surgery      Left  . Abdominal hysterectomy    . Back surgery      1973, 2012   . Ankle surgery  Family History  Problem Relation Age of Onset  . Heart disease Mother   . COPD Father   . Colon cancer Neg Hx    History  Substance Use Topics  . Smoking status: Former Smoker -- 4.00 packs/day    Quit date: 10/26/1998  . Smokeless tobacco: Never Used  . Alcohol Use: No   OB History    No data available     Review of Systems  All other systems reviewed and are negative.     Allergies  Morphine and related  Home Medications   Prior to Admission medications   Medication Sig Start Date End Date Taking? Authorizing Provider  anastrozole (ARIMIDEX) 1 MG tablet Take 1 tablet (1 mg total) by mouth daily. 08/17/14  Yes Heath Lark, MD  bethanechol (URECHOLINE) 25 MG tablet Take 25 mg by mouth daily.   Yes Historical Provider, MD  dicyclomine (BENTYL) 10 MG capsule Take 10 mg by mouth 2 (two) times daily.   Yes Historical Provider, MD  flecainide (TAMBOCOR) 100 MG tablet Take 0.5 tablets (50 mg total) by mouth 2 (two) times daily. 09/14/14   Yes Carlena Bjornstad, MD  furosemide (LASIX) 40 MG tablet Take 40 mg by mouth 2 (two) times daily.   Yes Historical Provider, MD  metFORMIN (GLUCOPHAGE) 500 MG tablet Take 750 mg by mouth 2 (two) times daily with a meal.   Yes Historical Provider, MD  metoprolol succinate (TOPROL-XL) 25 MG 24 hr tablet Take 0.5 tablets (12.5 mg total) by mouth daily. 08/28/14  Yes Rogelia Mire, NP  potassium chloride 20 MEQ/15ML (10%) solution Take 20 mEq by mouth daily.    Yes Historical Provider, MD  pyridoxine (B-6) 100 MG tablet Take 200 mg by mouth daily.    Yes Historical Provider, MD  vitamin B-12 (CYANOCOBALAMIN) 500 MCG tablet Take 500 mcg by mouth daily.    Yes Historical Provider, MD  warfarin (COUMADIN) 5 MG tablet Take 2.5-5 mg by mouth daily. Takes 2.5mg  everyday except 5mg  on Sunday and Thursday   Yes Historical Provider, MD  cephALEXin (KEFLEX) 500 MG capsule Take 1 capsule (500 mg total) by mouth once. 10/21/14   Richarda Blade, MD   BP 109/71 mmHg  Pulse 68  Temp(Src) 98.6 F (37 C) (Oral)  Resp 18  SpO2 93% Physical Exam  Constitutional: She is oriented to person, place, and time. She appears well-developed.  Elderly, frail  HENT:  Head: Normocephalic and atraumatic.  Right Ear: External ear normal.  Left Ear: External ear normal.  Eyes: Conjunctivae and EOM are normal. Pupils are equal, round, and reactive to light.  Neck: Normal range of motion and phonation normal. Neck supple.  Cardiovascular: Normal rate, regular rhythm and normal heart sounds.   Pulmonary/Chest: Effort normal and breath sounds normal. She exhibits no bony tenderness.  Abdominal: Soft. There is no tenderness.  Genitourinary:  Anus appears normal.  Brown stool in rectal vault, no rectal mass.  Stool sent for guaiac testing.  Musculoskeletal: Normal range of motion.  Neurological: She is alert and oriented to person, place, and time. No cranial nerve deficit or sensory deficit. She exhibits normal muscle  tone. Coordination normal.  Skin: Skin is warm, dry and intact.  Psychiatric: She has a normal mood and affect. Her behavior is normal. Judgment and thought content normal.  Nursing note and vitals reviewed.   ED Course  Procedures (including critical care time) Medications  0.9 %  sodium chloride infusion (125 mL/hr Intravenous New  Bag/Given 10/21/14 1039)  acetaminophen (TYLENOL) tablet 650 mg (650 mg Oral Given 10/21/14 1152)  cephALEXin (KEFLEX) capsule 500 mg (500 mg Oral Given 10/21/14 1152)    Patient Vitals for the past 24 hrs:  BP Temp Temp src Pulse Resp SpO2  10/21/14 1130 109/71 mmHg - - 68 18 93 %  10/21/14 0947 139/65 mmHg 98.6 F (37 C) Oral 60 18 94 %    12:15 PM Reevaluation with update and discussion. After initial assessment and treatment, an updated evaluation reveals no further complaints.  Patient is comfortable.  Findings discussed with patient and family members were with her. Jarvis Knodel L     Labs Review Labs Reviewed  COMPREHENSIVE METABOLIC PANEL - Abnormal; Notable for the following:    Glucose, Bld 219 (*)    Creatinine, Ser 1.41 (*)    GFR calc non Af Amer 34 (*)    GFR calc Af Amer 39 (*)    All other components within normal limits  CBC WITH DIFFERENTIAL - Abnormal; Notable for the following:    RBC 3.80 (*)    Hemoglobin 11.5 (*)    HCT 35.3 (*)    Neutrophils Relative % 78 (*)    Lymphs Abs 0.6 (*)    All other components within normal limits  PROTIME-INR - Abnormal; Notable for the following:    Prothrombin Time 22.7 (*)    INR 1.98 (*)    All other components within normal limits  URINALYSIS, ROUTINE W REFLEX MICROSCOPIC - Abnormal; Notable for the following:    APPearance TURBID (*)    Hgb urine dipstick MODERATE (*)    Leukocytes, UA LARGE (*)    All other components within normal limits  URINE MICROSCOPIC-ADD ON - Abnormal; Notable for the following:    Bacteria, UA FEW (*)    All other components within normal limits  URINE  CULTURE  OCCULT BLOOD X 1 CARD TO LAB, STOOL  POC OCCULT BLOOD, ED  SAMPLE TO BLOOD BANK    Imaging Review No results found.   EKG Interpretation None      MDM   Final diagnoses:  UTI (lower urinary tract infection)    On, given urinary tract infection, without evidence for just completing.  Doubt serious bacterial infection or metabolic instability.  Nursing Notes Reviewed/ Care Coordinated Applicable Imaging Reviewed Interpretation of Laboratory Data incorporated into ED treatment  The patient appears reasonably screened and/or stabilized for discharge and I doubt any other medical condition or other [facility] requiring further screening, evaluation, or treatment in the ED at this time prior to discharge.  Plan: Home Medications- Keflex; Home Treatments- rest, fluids; return here if the recommended treatment, does not improve the symptoms; Recommended follow up- PCP 4-5 days for check up     Richarda Blade, MD 10/21/14 1217

## 2014-10-21 NOTE — Discharge Instructions (Signed)

## 2014-10-21 NOTE — ED Notes (Signed)
MD at bedside. 

## 2014-10-21 NOTE — ED Notes (Signed)
Per EMS pt comes from home for lower abd pain and two dark stools this am.  Pt is currently be treated for bladder infection but states that she hasnt gotten any relief since she has started the meds.

## 2014-10-23 LAB — URINE CULTURE
Colony Count: >=100000
Special Requests: NORMAL

## 2014-10-24 ENCOUNTER — Telehealth (HOSPITAL_COMMUNITY): Payer: Self-pay

## 2014-10-24 NOTE — ED Notes (Signed)
Post ED Visit - Positive Culture Follow-up: Successful Patient Follow-Up  Culture assessed and recommendations reviewed by: []  Wes Zarephath, Pharm.D., BCPS []  Heide Guile, Pharm.D., BCPS []  Alycia Rossetti, Pharm.D., BCPS []  [place], [place].D., BCPS, AAHIVP []  Legrand Como, Pharm.D., BCPS, AAHIVP []  Hassie Bruce, Pharm.D. []  Cassie Nicole Kindred, [place].D.  Positive Urine culture  []  Patient discharged without antimicrobial prescription and treatment is now indicated [x]  Organism is resistant to prescribed ED discharge antimicrobial []  Patient with positive blood cultures  Changes discussed with ED provider: Irena Cords New antibiotic prescription Cipro 250mg  po BID x 3days. Stop Cephalexin Called to [facility] by another person (unknown)  Contacted patient, date 10/24/2014, time 12:24   Ileene Musa 10/24/2014, 12:23 PM

## 2014-10-24 NOTE — Progress Notes (Signed)
ED Antimicrobial Stewardship Positive Culture Follow Up   [name] [name] is an 81 y.o. female who presented to [facility] on 10/21/2014 with a chief complaint of  Chief Complaint  Patient presents with  . Abdominal Pain  . Melena  . Urinary Frequency    Recent Results (from the past 720 hour(s))  Urine culture     Status: None   Collection Time: 10/21/14 10:39 AM  Result Value Ref Range Status   Specimen Description URINE, CLEAN CATCH  Final   Special Requests Normal  Final   Colony Count   Final    >=100,000 COLONIES/ML Performed at [facility]    Culture   Final    PSEUDOMONAS AERUGINOSA Performed at [facility]    Report Status 10/23/2014 FINAL  Final   Organism ID, Bacteria PSEUDOMONAS AERUGINOSA  Final      Susceptibility   Pseudomonas aeruginosa - MIC*    CEFEPIME <=1 SENSITIVE Sensitive     CEFTAZIDIME 4 SENSITIVE Sensitive     CIPROFLOXACIN <=0.25 SENSITIVE Sensitive     GENTAMICIN <=1 SENSITIVE Sensitive     IMIPENEM 2 SENSITIVE Sensitive     PIP/TAZO <=4 SENSITIVE Sensitive     TOBRAMYCIN <=1 SENSITIVE Sensitive     * PSEUDOMONAS AERUGINOSA    [x]  Treated with cephalexin, organism resistant to prescribed antimicrobial []  Patient discharged originally without antimicrobial agent and treatment is now indicated  New antibiotic prescription: ciprofloxacin 250mg  po BID x 3 days.  Will alert warfarin clinic of this new prescription.  ED Provider: Irena Cords, PA-C   Candie Mile 10/24/2014, 9:46 AM Infectious Diseases Pharmacist Phone# [phone]

## 2014-10-26 ENCOUNTER — Encounter: Payer: Self-pay | Admitting: Cardiology

## 2014-10-26 ENCOUNTER — Ambulatory Visit (INDEPENDENT_AMBULATORY_CARE_PROVIDER_SITE_OTHER): Payer: Medicare HMO | Admitting: Cardiology

## 2014-10-26 ENCOUNTER — Ambulatory Visit (INDEPENDENT_AMBULATORY_CARE_PROVIDER_SITE_OTHER): Payer: Medicare HMO | Admitting: *Deleted

## 2014-10-26 VITALS — BP 122/68 | HR 68 | Ht 72.0 in | Wt 214.0 lb

## 2014-10-26 DIAGNOSIS — I480 Paroxysmal atrial fibrillation: Secondary | ICD-10-CM

## 2014-10-26 DIAGNOSIS — I5032 Chronic diastolic (congestive) heart failure: Secondary | ICD-10-CM

## 2014-10-26 DIAGNOSIS — I4891 Unspecified atrial fibrillation: Secondary | ICD-10-CM

## 2014-10-26 DIAGNOSIS — Z5181 Encounter for therapeutic drug level monitoring: Secondary | ICD-10-CM

## 2014-10-26 DIAGNOSIS — I2699 Other pulmonary embolism without acute cor pulmonale: Secondary | ICD-10-CM

## 2014-10-26 DIAGNOSIS — I482 Chronic atrial fibrillation, unspecified: Secondary | ICD-10-CM

## 2014-10-26 DIAGNOSIS — Z7901 Long term (current) use of anticoagulants: Secondary | ICD-10-CM

## 2014-10-26 DIAGNOSIS — R110 Nausea: Secondary | ICD-10-CM

## 2014-10-26 LAB — POCT INR: INR: 4.3

## 2014-10-26 MED ORDER — PROMETHAZINE HCL 12.5 MG PO TABS: 12.5000 mg | ORAL_TABLET | Freq: Three times a day (TID) | ORAL | Status: DC | PRN

## 2014-10-26 NOTE — Progress Notes (Signed)
HPI Patient is seen to follow-up supraventricular tachycardia. I saw her last September 07, 2014. At that time she had some shortness of breath and mild volume overload. This stabilized. She has been on flecainide in the past for her supraventricular tachycardia. A flecainide level was elevated and the patient was instructed to cut her dose from 100 twice a day to 50 twice a day. I'm finding out at the end of the visit today that the patient actually has not yet cut her dose. She decreased the dose for only one day. Therefore her flecainide dose will be decreased to 50 twice a day.  Recently she's had some urinary tract infections and she is on antibiotics. She's had some nausea and Phenergan has helped.  Allergies  Allergen Reactions  . Morphine And Related Other (See Comments)    Family requests no morphine due to past reaction    Current Outpatient Prescriptions  Medication Sig Dispense Refill  . anastrozole (ARIMIDEX) 1 MG tablet Take 1 tablet (1 mg total) by mouth daily. 90 tablet 3  . bethanechol (URECHOLINE) 25 MG tablet Take 25 mg by mouth daily.    . ciprofloxacin (CIPRO) 250 MG tablet Take 250 mg by mouth 2 (two) times daily. Take for 3 days    . dicyclomine (BENTYL) 10 MG capsule Take 10 mg by mouth 2 (two) times daily.    . flecainide (TAMBOCOR) 100 MG tablet Take 0.5 tablets (50 mg total) by mouth 2 (two) times daily. 60 tablet 3  . furosemide (LASIX) 40 MG tablet Take 40 mg by mouth 2 (two) times daily.    . metFORMIN (GLUCOPHAGE) 500 MG tablet Take 750 mg by mouth 2 (two) times daily with a meal.    . metoprolol succinate (TOPROL-XL) 25 MG 24 hr tablet Take 0.5 tablets (12.5 mg total) by mouth daily. 60 tablet 3  . potassium chloride 20 MEQ/15ML (10%) solution Take 20 mEq by mouth daily.     Marland Kitchen pyridoxine (B-6) 100 MG tablet Take 200 mg by mouth daily.     . vitamin B-12 (CYANOCOBALAMIN) 500 MCG tablet Take 500 mcg by mouth daily.     Marland Kitchen warfarin (COUMADIN) 5 MG tablet Take  2.5-5 mg by mouth daily. Takes 2.5mg  everyday except 5mg  on Sunday and Thursday     Current Facility-Administered Medications  Medication Dose Route Frequency Provider Last Rate Last Dose  . promethazine (PHENERGAN) tablet 12.5 mg  12.5 mg Oral Q8H PRN Carlena Bjornstad, MD        History   Social History  . Marital Status: Widowed    Spouse Name: N/A    Number of Children: 3  . Years of Education: N/A   Occupational History  . Retired     Social History Main Topics  . Smoking status: Former Smoker -- 4.00 packs/day    Quit date: 10/26/1998  . Smokeless tobacco: Never Used  . Alcohol Use: No  . Drug Use: No  . Sexual Activity: Not on file   Other Topics Concern  . Not on file   Social History Narrative   Widowed.  Lives in [place] by herself.  Avoids caffeine.  Very active, taking care of her brother who also lives by himself and is in his late 54's.    Family History  Problem Relation Age of Onset  . Heart disease Mother   . COPD Father   . Colon cancer Neg Hx     Past Medical History  Diagnosis  Date  . Hypertension   . Diabetes mellitus   . Tachy-brady syndrome     s/p pacer 07/2009  . Atrial fibrillation     flecainide; coumadin  . Atrial flutter     November, 2010, flecainide, Coumadin  . Chronic diastolic heart failure     echo 10/10: mild LVH, EF 55-60%, mild AI, mild MR, mild to mod LAE, mild RVE, severe RAE, mod TR, PASP 53;  Echo 3/14:  Mild LVH, EF 55%, Tr AI, MAC, mild MR, mod LAE, PASP 31, trivial eff.  . Shortness of breath     myoview 11/10: EF 65%, no ischemia  . Pulmonary HTN     53 mmHg echo, 2010, moderate TR, mild right ventricular enlargement  . DVT (deep venous thrombosis)     Pulmonary embolus  . Pulmonary embolus 2006    2006, with DVT  . Carotid artery disease     [id number]: RICA [id number], LICA [id number]  . Thyroid nodule     non-neoplastic goiter  . Breast cancer   . Ejection fraction     EF 60%, echo, October, 2010  . Warfarin  anticoagulation     Atrial fibrillation  . Pacemaker     November, 2010, Dr Rayann Heman - MDT ADDRL1 [facility] DC PPM ser #: [id number]    . S/P laminectomy     June 22, 2011  . Syncope     2010, treated with pacemaker  . Drug therapy     FLECAINIDE  . Mitral regurgitation     mild, echo, October, 2010  . Mediastinal lymphadenopathy   . Venous insufficiency     Chronic  . Hypokalemia     October, 2012, potassium dose increased  . Hiatal hernia 2006    EGD  . Acute gastric ulcer without mention of hemorrhage, perforation, or obstruction 2006    EGD  . Esophagitis, unspecified 2006    EGD  . Allergy     seasonal  . Diverticulosis   . Hx of adenomatous colonic polyps   . Fecal incontinence   . Breast cancer   . Osteopenia     Past Surgical History  Procedure Laterality Date  . Breast surgery      Left  . Abdominal hysterectomy    . Back surgery      1973, 2012   . Ankle surgery      Patient Active Problem List   Diagnosis Date Noted  . Chronic diastolic CHF (congestive heart failure) 09/07/2014  . Dizziness 08/28/2014  . Paroxysmal atrial fibrillation 08/27/2014  . Diarrhea 08/03/2014  . Supratherapeutic INR   . Rectal bleeding   . Sick sinus syndrome 03/09/2014  . Encounter for therapeutic drug monitoring 11/04/2013  . Right leg swelling 07/19/2013  . Hypertension 06/21/2013  . Osteopenia   . Hypokalemia   . Tachy-brady syndrome   . Atrial fibrillation   . Atrial flutter   . Shortness of breath   . Pulmonary HTN   . DVT (deep venous thrombosis)   . Pulmonary embolus   . Carotid artery disease   . Breast cancer, left breast   . Ejection fraction   . Warfarin anticoagulation   . Pacemaker-Medtronic   . S/P laminectomy   . Syncope   . Drug therapy   . Mitral regurgitation   . Mediastinal lymphadenopathy   . Venous insufficiency   . Ulcer of toe 04/26/2011  . THYROID NODULE, HX OF 07/19/2009  . Diabetes mellitus 06/13/2008  . ALLERGIC RHINITIS  06/13/2008  . MEDIASTINAL LYMPHADENOPATHY 06/13/2008    ROS  Patient denies fever, chills, headache, sweats, rash, change in vision, change in hearing, chest pain, cough, . All other systems are reviewed and are negative other than the history of present illness.  PHYSICAL EXAM Patient is stable today. She is oriented to person time and place. Affect is normal. She is here with her daughter. Head is atraumatic. Sclera and conjunctiva are normal. There is no jugular venous distention. Lungs are clear. Respiratory effort is not labored. Cardiac exam reveals S1 and S2. The rhythm is regular. Abdomen is soft. There is no peripheral edema.  Filed Vitals:   10/26/14 1557  BP: 122/68  Pulse: 68  Height: 6' (1.829 m)  Weight: 214 lb (97.07 kg)     ASSESSMENT & PLAN

## 2014-10-26 NOTE — Assessment & Plan Note (Signed)
Volume status is stable. No change in therapy. 

## 2014-10-26 NOTE — Assessment & Plan Note (Signed)
The patient has had some nausea related to medications for her urinary tract. I have agreed to write a prescription for Phenergan with no refill.

## 2014-10-26 NOTE — Patient Instructions (Signed)
Your physician recommends that you continue on your current medications as directed. Please refer to the Current Medication list given to you today. You should be taking 50 mg of Flecainide twice daily.   Your physician recommends that you schedule a follow-up appointment in: 3 months

## 2014-10-26 NOTE — Assessment & Plan Note (Signed)
Patient continues on Coumadin. We will reduce her flecainide to 50 mg twice daily. I will work to try to obtain a trough level when I'm sure that she is taking this dose properly.

## 2014-10-28 ENCOUNTER — Encounter: Payer: Self-pay | Admitting: *Deleted

## 2014-11-01 ENCOUNTER — Ambulatory Visit (INDEPENDENT_AMBULATORY_CARE_PROVIDER_SITE_OTHER): Payer: Medicare HMO | Admitting: *Deleted

## 2014-11-01 DIAGNOSIS — Z7901 Long term (current) use of anticoagulants: Secondary | ICD-10-CM

## 2014-11-01 DIAGNOSIS — I4891 Unspecified atrial fibrillation: Secondary | ICD-10-CM

## 2014-11-01 DIAGNOSIS — I480 Paroxysmal atrial fibrillation: Secondary | ICD-10-CM

## 2014-11-01 DIAGNOSIS — Z5181 Encounter for therapeutic drug level monitoring: Secondary | ICD-10-CM

## 2014-11-01 DIAGNOSIS — I2699 Other pulmonary embolism without acute cor pulmonale: Secondary | ICD-10-CM

## 2014-11-01 LAB — POCT INR: INR: 2.9

## 2014-11-03 ENCOUNTER — Telehealth: Payer: Self-pay | Admitting: Internal Medicine

## 2014-11-03 NOTE — Telephone Encounter (Signed)
New Message  Pt recevied a letter from our office about making a device appt as soon as possible. She has June recall w/ [employer] and just saw Ron Parker on 1/28. Pt wanted to speak w/ someone. Please call back and discuss.

## 2014-11-03 NOTE — Telephone Encounter (Signed)
Informed pt that the reason she got the letter was b/c she was due to send a remote transmission on 06-13-14. Pt send home monitor back and prefers to have device interrogated in office only. Pt agreed to appt on 12-08-14 w/ device clinic to have PPM interrogated.

## 2014-11-17 ENCOUNTER — Ambulatory Visit (INDEPENDENT_AMBULATORY_CARE_PROVIDER_SITE_OTHER): Payer: Medicare HMO

## 2014-11-17 ENCOUNTER — Ambulatory Visit: Payer: Medicare HMO | Admitting: Cardiology

## 2014-11-17 DIAGNOSIS — Z5181 Encounter for therapeutic drug level monitoring: Secondary | ICD-10-CM

## 2014-11-17 DIAGNOSIS — Z7901 Long term (current) use of anticoagulants: Secondary | ICD-10-CM

## 2014-11-17 DIAGNOSIS — I4891 Unspecified atrial fibrillation: Secondary | ICD-10-CM

## 2014-11-17 DIAGNOSIS — I2699 Other pulmonary embolism without acute cor pulmonale: Secondary | ICD-10-CM

## 2014-11-17 LAB — POCT INR: INR: 4.1

## 2014-12-01 ENCOUNTER — Ambulatory Visit (INDEPENDENT_AMBULATORY_CARE_PROVIDER_SITE_OTHER): Payer: Medicare HMO

## 2014-12-01 DIAGNOSIS — Z5181 Encounter for therapeutic drug level monitoring: Secondary | ICD-10-CM

## 2014-12-01 DIAGNOSIS — Z7901 Long term (current) use of anticoagulants: Secondary | ICD-10-CM

## 2014-12-01 DIAGNOSIS — I4891 Unspecified atrial fibrillation: Secondary | ICD-10-CM

## 2014-12-01 DIAGNOSIS — I2699 Other pulmonary embolism without acute cor pulmonale: Secondary | ICD-10-CM

## 2014-12-01 LAB — POCT INR: INR: 3

## 2014-12-08 ENCOUNTER — Ambulatory Visit (INDEPENDENT_AMBULATORY_CARE_PROVIDER_SITE_OTHER): Payer: Medicare HMO | Admitting: *Deleted

## 2014-12-08 DIAGNOSIS — I495 Sick sinus syndrome: Secondary | ICD-10-CM | POA: Diagnosis not present

## 2014-12-08 LAB — MDC_IDC_ENUM_SESS_TYPE_INCLINIC
Battery Impedance: 377 Ohm
Battery Voltage: 2.79 V
Brady Statistic AP VP Percent: 0 %
Brady Statistic AS VS Percent: 0 %
Lead Channel Impedance Value: 421 Ohm
Lead Channel Impedance Value: 451 Ohm
Lead Channel Pacing Threshold Pulse Width: 0.76 ms
Lead Channel Sensing Intrinsic Amplitude: 1.4 mV
Lead Channel Sensing Intrinsic Amplitude: 8 mV
Lead Channel Setting Pacing Amplitude: 2 V
Lead Channel Setting Pacing Pulse Width: 0.76 ms
Lead Channel Setting Sensing Sensitivity: 2.8 mV
MDC IDC MSMT BATTERY REMAINING LONGEVITY: 99 mo
MDC IDC MSMT LEADCHNL RA PACING THRESHOLD AMPLITUDE: 0.75 V
MDC IDC MSMT LEADCHNL RA PACING THRESHOLD PULSEWIDTH: 0.4 ms
MDC IDC MSMT LEADCHNL RV PACING THRESHOLD AMPLITUDE: 1.25 V
MDC IDC SESS DTM: 20160310090712
MDC IDC SET LEADCHNL RV PACING AMPLITUDE: 2.5 V
MDC IDC STAT BRADY AP VS PERCENT: 99 %
MDC IDC STAT BRADY AS VP PERCENT: 0 %

## 2014-12-08 NOTE — Progress Notes (Signed)
Pacemaker check in clinic. Normal device function. Thresholds, sensing, impedances consistent with previous measurements. Device programmed to maximize longevity. 11 mode switches (<0.1%)---10 AHR episodes---max dur. 45 mins 16 sec, Max A >400, Max Avg V 122, last 11/3 + Warfarin. No high ventricular rates noted. Device programmed at appropriate safety margins. Histogram distribution appropriate for patient activity level. Device programmed to optimize intrinsic conduction. Estimated longevity 8 years. Patient will follow up with JA on 6/16 @ 9:15am.

## 2014-12-16 ENCOUNTER — Encounter: Payer: Self-pay | Admitting: Internal Medicine

## 2014-12-17 ENCOUNTER — Other Ambulatory Visit: Payer: Self-pay | Admitting: Cardiology

## 2014-12-22 ENCOUNTER — Ambulatory Visit (INDEPENDENT_AMBULATORY_CARE_PROVIDER_SITE_OTHER): Payer: Medicare HMO | Admitting: *Deleted

## 2014-12-22 DIAGNOSIS — I4891 Unspecified atrial fibrillation: Secondary | ICD-10-CM | POA: Diagnosis not present

## 2014-12-22 DIAGNOSIS — I2699 Other pulmonary embolism without acute cor pulmonale: Secondary | ICD-10-CM

## 2014-12-22 DIAGNOSIS — Z7901 Long term (current) use of anticoagulants: Secondary | ICD-10-CM

## 2014-12-22 DIAGNOSIS — Z5181 Encounter for therapeutic drug level monitoring: Secondary | ICD-10-CM

## 2014-12-22 LAB — POCT INR: INR: 1.9

## 2015-01-12 ENCOUNTER — Ambulatory Visit (INDEPENDENT_AMBULATORY_CARE_PROVIDER_SITE_OTHER): Payer: Medicare HMO

## 2015-01-12 DIAGNOSIS — Z5181 Encounter for therapeutic drug level monitoring: Secondary | ICD-10-CM | POA: Diagnosis not present

## 2015-01-12 DIAGNOSIS — Z7901 Long term (current) use of anticoagulants: Secondary | ICD-10-CM

## 2015-01-12 DIAGNOSIS — I2699 Other pulmonary embolism without acute cor pulmonale: Secondary | ICD-10-CM

## 2015-01-12 DIAGNOSIS — I4891 Unspecified atrial fibrillation: Secondary | ICD-10-CM | POA: Diagnosis not present

## 2015-01-12 LAB — POCT INR: INR: 2.5

## 2015-01-27 ENCOUNTER — Encounter: Payer: Self-pay | Admitting: Cardiology

## 2015-01-27 ENCOUNTER — Ambulatory Visit (INDEPENDENT_AMBULATORY_CARE_PROVIDER_SITE_OTHER): Payer: Medicare HMO | Admitting: Cardiology

## 2015-01-27 VITALS — BP 134/68 | HR 64 | Ht 72.0 in | Wt 226.4 lb

## 2015-01-27 DIAGNOSIS — I5032 Chronic diastolic (congestive) heart failure: Secondary | ICD-10-CM

## 2015-01-27 DIAGNOSIS — I480 Paroxysmal atrial fibrillation: Secondary | ICD-10-CM

## 2015-01-27 DIAGNOSIS — I779 Disorder of arteries and arterioles, unspecified: Secondary | ICD-10-CM | POA: Diagnosis not present

## 2015-01-27 DIAGNOSIS — Z950 Presence of cardiac pacemaker: Secondary | ICD-10-CM

## 2015-01-27 DIAGNOSIS — I739 Peripheral vascular disease, unspecified: Secondary | ICD-10-CM

## 2015-01-27 DIAGNOSIS — I340 Nonrheumatic mitral (valve) insufficiency: Secondary | ICD-10-CM | POA: Diagnosis not present

## 2015-01-27 LAB — BASIC METABOLIC PANEL
BUN: 24 mg/dL — ABNORMAL HIGH (ref 6–23)
CHLORIDE: 98 meq/L (ref 96–112)
CO2: 26 mEq/L (ref 19–32)
Calcium: 9.7 mg/dL (ref 8.4–10.5)
Creat: 1.34 mg/dL — ABNORMAL HIGH (ref 0.50–1.10)
Glucose, Bld: 114 mg/dL — ABNORMAL HIGH (ref 70–99)
Potassium: 4.2 mEq/L (ref 3.5–5.3)
SODIUM: 137 meq/L (ref 135–145)

## 2015-01-27 NOTE — Assessment & Plan Note (Signed)
She has documented carotid artery disease. This is being followed with [facility].

## 2015-01-27 NOTE — Assessment & Plan Note (Addendum)
Overall her volume status is stable. She is taking diuretic and stable with this. We need to check her chemistry.

## 2015-01-27 NOTE — Assessment & Plan Note (Signed)
The patient is anticoagulated. She has paroxysmal atrial fibrillation. She is anticoagulated. She does well with low-dose flecainide.

## 2015-01-27 NOTE — Assessment & Plan Note (Signed)
Her pacemaker is followed by the electrophysiology team.

## 2015-01-27 NOTE — Progress Notes (Signed)
Cardiology Office Note   Date:  01/27/2015   ID:  [name] [name], DOB May 05, 1931, MRN [id number]  PCP:  Foye Spurling, MD  Cardiologist:  Dola Argyle, MD   Chief Complaint  Patient presents with  . Appointment    Follow-up supraventricular tachycardia   follow-up paroxysmal atrial fibrillation.    History of Present Illness: [name] [name] is a 81 y.o. female who presents  today to follow-up paroxysmal atrial fibrillation.. She is actually doing well. Her volume status has been under control. Now that she is taking the proper dose of flecainide she is stable. At one period of time she was taking a larger dose than I wanted. She is taking 50 mg twice a day and does very well with this. She's not having any chest pain.    Past Medical History  Diagnosis Date  . Hypertension   . Diabetes mellitus   . Tachy-brady syndrome     s/p pacer 07/2009  . Atrial fibrillation     flecainide; coumadin  . Atrial flutter     November, 2010, flecainide, Coumadin  . Chronic diastolic heart failure     echo 10/10: mild LVH, EF 55-60%, mild AI, mild MR, mild to mod LAE, mild RVE, severe RAE, mod TR, PASP 53;  Echo 3/14:  Mild LVH, EF 55%, Tr AI, MAC, mild MR, mod LAE, PASP 31, trivial eff.  . Shortness of breath     myoview 11/10: EF 65%, no ischemia  . Pulmonary HTN     53 mmHg echo, 2010, moderate TR, mild right ventricular enlargement  . DVT (deep venous thrombosis)     Pulmonary embolus  . Pulmonary embolus 2006    2006, with DVT  . Carotid artery disease     [id number]: RICA [id number], LICA [id number]  . Thyroid nodule     non-neoplastic goiter  . Breast cancer   . Ejection fraction     EF 60%, echo, October, 2010  . Warfarin anticoagulation     Atrial fibrillation  . Pacemaker     November, 2010, Dr Rayann Heman - MDT ADDRL1 Adapta DC PPM ser #: [id number]    . S/P laminectomy     June 22, 2011  . Syncope     2010, treated with pacemaker  . Drug therapy     FLECAINIDE  . Mitral  regurgitation     mild, echo, October, 2010  . Mediastinal lymphadenopathy   . Venous insufficiency     Chronic  . Hypokalemia     October, 2012, potassium dose increased  . Hiatal hernia 2006    EGD  . Acute gastric ulcer without mention of hemorrhage, perforation, or obstruction 2006    EGD  . Esophagitis, unspecified 2006    EGD  . Allergy     seasonal  . Diverticulosis   . Hx of adenomatous colonic polyps   . Fecal incontinence   . Breast cancer   . Osteopenia     Past Surgical History  Procedure Laterality Date  . Breast surgery      Left  . Abdominal hysterectomy    . Back surgery      1973, 2012   . Ankle surgery      Patient Active Problem List   Diagnosis Date Noted  . Nausea 10/26/2014  . Chronic diastolic CHF (congestive heart failure) 09/07/2014  . Dizziness 08/28/2014  . Paroxysmal atrial fibrillation 08/27/2014  . Diarrhea 08/03/2014  . Supratherapeutic INR   . Rectal bleeding   .  Sick sinus syndrome 03/09/2014  . Encounter for therapeutic drug monitoring 11/04/2013  . Right leg swelling 07/19/2013  . Hypertension 06/21/2013  . Osteopenia   . Hypokalemia   . Tachy-brady syndrome   . Atrial fibrillation   . Atrial flutter   . Shortness of breath   . Pulmonary HTN   . DVT (deep venous thrombosis)   . Pulmonary embolus   . Carotid artery disease   . Breast cancer, left breast   . Ejection fraction   . Warfarin anticoagulation   . Pacemaker-Medtronic   . S/P laminectomy   . Syncope   . Drug therapy   . Mitral regurgitation   . Mediastinal lymphadenopathy   . Venous insufficiency   . Ulcer of toe 04/26/2011  . THYROID NODULE, HX OF 07/19/2009  . Diabetes mellitus 06/13/2008  . ALLERGIC RHINITIS 06/13/2008  . MEDIASTINAL LYMPHADENOPATHY 06/13/2008      Current Outpatient Prescriptions  Medication Sig Dispense Refill  . gabapentin (NEURONTIN) 100 MG capsule Take 100 mg by mouth 3 (three) times daily.     Marland Kitchen ALPRAZolam (XANAX) 0.25 MG  tablet Take 0.25 mg by mouth as needed for anxiety.    Marland Kitchen anastrozole (ARIMIDEX) 1 MG tablet Take 1 tablet (1 mg total) by mouth daily. 90 tablet 3  . bethanechol (URECHOLINE) 25 MG tablet Take 25 mg by mouth daily.    Marland Kitchen dicyclomine (BENTYL) 10 MG capsule Take 10 mg by mouth 2 (two) times daily.    . flecainide (TAMBOCOR) 100 MG tablet Take 0.5 tablets (50 mg total) by mouth 2 (two) times daily. 60 tablet 3  . furosemide (LASIX) 40 MG tablet Take 40 mg by mouth 2 (two) times daily.    . metFORMIN (GLUCOPHAGE) 500 MG tablet Take 750 mg by mouth 2 (two) times daily with a meal.    . metoprolol succinate (TOPROL-XL) 25 MG 24 hr tablet Take 0.5 tablets (12.5 mg total) by mouth daily. 60 tablet 3  . potassium chloride 20 MEQ/15ML (10%) solution Take 20 mEq by mouth daily.     Marland Kitchen pyridoxine (B-6) 100 MG tablet Take 200 mg by mouth daily.     . vitamin B-12 (CYANOCOBALAMIN) 500 MCG tablet Take 500 mcg by mouth daily.     Marland Kitchen warfarin (COUMADIN) 5 MG tablet TAKE 1 TABLET (5 MG TOTAL) BY MOUTH ONE TIME ONLY AT 6 PM. 30 tablet 3   Current Facility-Administered Medications  Medication Dose Route Frequency Provider Last Rate Last Dose  . promethazine (PHENERGAN) tablet 12.5 mg  12.5 mg Oral Q8H PRN Carlena Bjornstad, MD        Allergies:   Morphine and Morphine and related    Social History:  The patient  reports that she quit smoking about 16 years ago. She has never used smokeless tobacco. She reports that she does not drink alcohol or use illicit drugs.   Family History:  The patient's family history includes COPD in her father; Heart disease in her mother. There is no history of Colon cancer.    ROS:  Please see the history of present illness.  Patient denies fever, chills, headache, sweats, rash, change in vision, change in hearing, chest pain, cough, nausea or vomiting, urinary symptoms. All other systems are reviewed and are negative.      PHYSICAL EXAM: VS:  BP 134/68 mmHg  Pulse 64  Ht 6'  (1.829 m)  Wt 226 lb 6.4 oz (102.694 kg)  BMI 30.70 kg/m2  SpO2 95% ,  Patient is oriented to person time and place. Affect is normal. Head is atraumatic. Sclera and conjunctiva are normal. There is no jugular venous distention. Lungs are clear. Respiratory effort is not labored. Cardiac exam reveals S1 and S2. Abdomen is soft. There is no peripheral edema. There are no musculoskeletal deformities. There are no skin rashes. Patient is overweight. She is here with her daughter. Neurologic is grossly intact.  EKG:   EKG is not done today.   Recent Labs: 08/27/2014: Pro B Natriuretic peptide (BNP) 825.8* 10/21/2014: ALT 15; BUN 21; Creatinine 1.41*; Hemoglobin 11.5*; Platelets 266; Potassium 3.7; Sodium 137    Lipid Panel No results found for: CHOL, TRIG, HDL, CHOLHDL, VLDL, LDLCALC, LDLDIRECT    Wt Readings from Last 3 Encounters:  01/27/15 226 lb 6.4 oz (102.694 kg)  10/26/14 214 lb (97.07 kg)  09/07/14 216 lb 12.8 oz (98.34 kg)      Current medicines are reviewed  the patient understands her medications.     ASSESSMENT AND PLAN:

## 2015-01-27 NOTE — Patient Instructions (Signed)
**Note De-identified  Obfuscation** Medication Instructions:  None  Labwork: None  Testing/Procedures: None  Follow-Up: Your physician wants you to follow-up in: 6 months. You will receive a reminder letter in the mail two months in advance. If you don't receive a letter, please call our office to schedule the follow-up appointment.      

## 2015-02-02 ENCOUNTER — Other Ambulatory Visit: Payer: Self-pay | Admitting: Cardiology

## 2015-02-03 NOTE — Telephone Encounter (Signed)
Per notes 4.29.16

## 2015-02-04 ENCOUNTER — Other Ambulatory Visit: Payer: Self-pay | Admitting: Cardiology

## 2015-02-09 ENCOUNTER — Ambulatory Visit (INDEPENDENT_AMBULATORY_CARE_PROVIDER_SITE_OTHER): Payer: Medicare HMO

## 2015-02-09 DIAGNOSIS — I4891 Unspecified atrial fibrillation: Secondary | ICD-10-CM

## 2015-02-09 DIAGNOSIS — I2699 Other pulmonary embolism without acute cor pulmonale: Secondary | ICD-10-CM | POA: Diagnosis not present

## 2015-02-09 DIAGNOSIS — Z7901 Long term (current) use of anticoagulants: Secondary | ICD-10-CM | POA: Diagnosis not present

## 2015-02-09 DIAGNOSIS — Z5181 Encounter for therapeutic drug level monitoring: Secondary | ICD-10-CM

## 2015-02-09 LAB — POCT INR: INR: 1.8

## 2015-02-13 ENCOUNTER — Telehealth: Payer: Self-pay | Admitting: Cardiology

## 2015-02-13 NOTE — Telephone Encounter (Signed)
Per Dr Ron Parker the pt is advised to take 40 mg of Furosemide BID. She verbalized understanding.

## 2015-02-13 NOTE — Telephone Encounter (Signed)
New message      Pt c/o swelling: STAT is pt has developed SOB within 24 hours  1. How long have you been experiencing swelling? 3-4 days 2. Where is the swelling located?  ankles  3.  Are you currently taking a "fluid pill"? Yes----furosemide----40mg  twice a day 4.  Are you currently SOB?  Starting to get a "bit" sob on exertion  5.  Have you traveled recently? no

## 2015-02-13 NOTE — Telephone Encounter (Signed)
The pt states that he ankles have been swelling since her last OV with Dr Ron Parker on 4/29. I went over her medications list with her over the phone. She states that at her last OV on 4/29 Dr Ron Parker told her to cut her Furosemide 40 mg tablets in half and to only take 20 mg BID. This is not noted in her 4/29 OV notes. Will discuss with Dr Ron Parker.

## 2015-02-14 ENCOUNTER — Telehealth: Payer: Self-pay | Admitting: Hematology and Oncology

## 2015-02-14 ENCOUNTER — Ambulatory Visit (HOSPITAL_BASED_OUTPATIENT_CLINIC_OR_DEPARTMENT_OTHER): Payer: Medicare HMO | Admitting: Hematology and Oncology

## 2015-02-14 ENCOUNTER — Other Ambulatory Visit: Payer: Self-pay | Admitting: Hematology and Oncology

## 2015-02-14 ENCOUNTER — Encounter: Payer: Self-pay | Admitting: Hematology and Oncology

## 2015-02-14 VITALS — BP 149/68 | HR 75 | Temp 97.9°F | Resp 18 | Ht 72.0 in | Wt 230.1 lb

## 2015-02-14 DIAGNOSIS — N63 Unspecified lump in unspecified breast: Secondary | ICD-10-CM

## 2015-02-14 DIAGNOSIS — C50912 Malignant neoplasm of unspecified site of left female breast: Secondary | ICD-10-CM

## 2015-02-14 NOTE — Telephone Encounter (Signed)
Pt confirmed MM at [facility] and per 05/17 POF no return..... Sent msg to MD requesting if pt is to have labs, pt states she has them done elsewhere.... KJ

## 2015-02-14 NOTE — Progress Notes (Signed)
[facility] OFFICE PROGRESS NOTE  Patient Care Team: Preston S Clark, MD as PCP - General (Endocrinology) Dora M Brodie, MD (Gastroenterology)  , MD as Consulting Physician (Hematology and Oncology)  SUMMARY OF ONCOLOGIC HISTORY: Oncology History   Breast cancer   Primary site: Breast (Left)   Clinical free text: IIB   Clinical: Stage IIB (T2, N1, cM0) signed by  , MD on 08/17/2013  9:28 AM   Pathologic: Stage IIB (T2, N1, cM0) signed by  , MD on 08/17/2013  9:28 AM   Summary: Stage IIB (T2, N1, cM0)      Breast cancer, left breast   01/16/2010 Procedure Needle biopsy confirmed invasive ductal carcinoma, 100% ER+,62% PR+, Her2 by CISH 1.74   02/07/2010 Surgery She had left mastectomy and SLN biopsy, 3.5 x 2 x 2 cm mass, 1 positive LN, Nottingham Grade II,  final staging IIB   03/04/2010 -  Chemotherapy She started taking Anastrazole    INTERVAL HISTORY: Please see below for problem oriented charting. She denies any recent abnormal breast examination, palpable mass, abnormal breast appearance or nipple changes   REVIEW OF SYSTEMS:   Constitutional: Denies fevers, chills or abnormal weight loss Eyes: Denies blurriness of vision Ears, nose, mouth, throat, and face: Denies mucositis or sore throat Respiratory: Denies cough, dyspnea or wheezes Cardiovascular: Denies palpitation, chest discomfort or lower extremity swelling Gastrointestinal:  Denies nausea, heartburn or change in bowel habits Skin: Denies abnormal skin rashes Lymphatics: Denies new lymphadenopathy or easy bruising Neurological:Denies numbness, tingling or new weaknesses Behavioral/Psych: Mood is stable, no new changes  All other systems were reviewed with the patient and are negative.  I have reviewed the past medical history, past surgical history, social history and family history with the patient and they are unchanged from previous note.  ALLERGIES:  is allergic to  morphine and morphine and related.  MEDICATIONS:  Current Outpatient Prescriptions  Medication Sig Dispense Refill  . ALPRAZolam (XANAX) 0.25 MG tablet Take 0.25 mg by mouth as needed for anxiety.    . anastrozole (ARIMIDEX) 1 MG tablet Take 1 tablet (1 mg total) by mouth daily. 90 tablet 3  . bethanechol (URECHOLINE) 25 MG tablet Take 25 mg by mouth daily.    . dicyclomine (BENTYL) 10 MG capsule Take 10 mg by mouth 2 (two) times daily.    . flecainide (TAMBOCOR) 100 MG tablet Take 0.5 tablets (50 mg total) by mouth 2 (two) times daily. 60 tablet 3  . furosemide (LASIX) 40 MG tablet Take 40 mg by mouth 2 (two) times daily.    . GABAPENTIN PO Take 200 mg by mouth 3 (three) times daily.    . metFORMIN (GLUCOPHAGE) 500 MG tablet Take 750 mg by mouth 2 (two) times daily with a meal.    . metoprolol succinate (TOPROL-XL) 25 MG 24 hr tablet Take 1/2 (12.5 mg) tablet by mouth daily 45 tablet 3  . nitrofurantoin (MACRODANTIN) 100 MG capsule Take 100 mg by mouth 2 (two) times daily.    . potassium chloride 20 MEQ/15ML (10%) solution Take 20 mEq by mouth daily.     . pyridoxine (B-6) 100 MG tablet Take 200 mg by mouth daily.     . vitamin B-12 (CYANOCOBALAMIN) 500 MCG tablet Take 500 mcg by mouth daily.     . warfarin (COUMADIN) 5 MG tablet TAKE 1 TABLET (5 MG TOTAL) BY MOUTH ONE TIME ONLY AT 6 PM. 30 tablet 3   Current Facility-Administered Medications  Medication   Dose Route Frequency Provider Last Rate Last Dose  . promethazine (PHENERGAN) tablet 12.5 mg  12.5 mg Oral Q8H PRN Jeffrey D Katz, MD        PHYSICAL EXAMINATION: ECOG PERFORMANCE STATUS: 0 - Asymptomatic  Filed Vitals:   02/14/15 0818  BP: 149/68  Pulse: 75  Temp: 97.9 F (36.6 C)  Resp: 18   Filed Weights   02/14/15 0818  Weight: 230 lb 1.6 oz (104.373 kg)    GENERAL:alert, no distress and comfortable SKIN: skin color, texture, turgor are normal, no rashes or significant lesions EYES: normal, Conjunctiva are pink and  non-injected, sclera clear OROPHARYNX:no exudate, no erythema and lips, buccal mucosa, and tongue normal  NECK: supple, thyroid normal size, non-tender, without nodularity LYMPH:  no palpable lymphadenopathy in the cervical, axillary or inguinal LUNGS: clear to auscultation and percussion with normal breathing effort HEART: regular rate & rhythm and no murmurs and no lower extremity edema ABDOMEN:abdomen soft, non-tender and normal bowel sounds Musculoskeletal:no cyanosis of digits and no clubbing  NEURO: alert & oriented x 3 with fluent speech, no focal motor/sensory deficits She has normal breast exam on the right. On the left, well-healed mastectomy scar with no other abnormalities  LABORATORY DATA:  I have reviewed the data as listed    Component Value Date/Time   NA 137 01/27/2015 1628   NA 139 03/22/2013 1015   NA 139 11/30/2011 0748   NA 140 10/14/2011 0937   K 4.2 01/27/2015 1628   K 3.7 03/22/2013 1015   K 4.2 11/30/2011 0748   K 4.3 10/14/2011 0937   CL 98 01/27/2015 1628   CL 101 03/22/2013 1015   CL 101 11/30/2011 0748   CL 100 10/14/2011 0937   CO2 26 01/27/2015 1628   CO2 26 03/22/2013 1015   CO2 24 11/30/2011 0748   CO2 29 10/14/2011 0937   GLUCOSE 114* 01/27/2015 1628   GLUCOSE 197* 03/22/2013 1015   GLUCOSE 137* 11/30/2011 0748   GLUCOSE 268* 10/14/2011 0937   BUN 24* 01/27/2015 1628   BUN 23.2 03/22/2013 1015   BUN 25* 11/30/2011 0748   BUN 14 10/14/2011 0937   CREATININE 1.34* 01/27/2015 1628   CREATININE 1.41* 10/21/2014 1020   CREATININE 1.3* 03/22/2013 1015   CREATININE 1.57* 11/30/2011 0748   CALCIUM 9.7 01/27/2015 1628   CALCIUM 9.8 03/22/2013 1015   CALCIUM 9.5 11/30/2011 0748   CALCIUM 9.2 10/14/2011 0937   PROT 7.5 10/21/2014 1020   PROT 7.6 03/22/2013 1015   PROT 8.4* 11/30/2011 0748   PROT 7.2 10/14/2011 0937   ALBUMIN 3.7 10/21/2014 1020   ALBUMIN 3.5 03/22/2013 1015   ALBUMIN 3.7 11/30/2011 0748   AST 29 10/21/2014 1020   AST 16  03/22/2013 1015   AST 25 11/30/2011 0748   AST 15 10/14/2011 0937   ALT 15 10/21/2014 1020   ALT 12 03/22/2013 1015   ALT 19 11/30/2011 0748   ALT 17 10/14/2011 0937   ALKPHOS 116 10/21/2014 1020   ALKPHOS 102 03/22/2013 1015   ALKPHOS 136 11/30/2011 0748   ALKPHOS 109* 10/14/2011 0937   BILITOT 1.0 10/21/2014 1020   BILITOT 0.60 03/22/2013 1015   BILITOT 0.50 10/14/2011 0937   GFRNONAA 34* 10/21/2014 1020   GFRAA 39* 10/21/2014 1020    No results found for: SPEP, UPEP  Lab Results  Component Value Date   WBC 5.2 10/21/2014   NEUTROABS 4.1 10/21/2014   HGB 11.5* 10/21/2014   HCT 35.3* 10/21/2014     MCV 92.9 10/21/2014   PLT 266 10/21/2014      Chemistry      Component Value Date/Time   NA 137 01/27/2015 1628   NA 139 03/22/2013 1015   NA 139 11/30/2011 0748   NA 140 10/14/2011 0937   K 4.2 01/27/2015 1628   K 3.7 03/22/2013 1015   K 4.2 11/30/2011 0748   K 4.3 10/14/2011 0937   CL 98 01/27/2015 1628   CL 101 03/22/2013 1015   CL 101 11/30/2011 0748   CL 100 10/14/2011 0937   CO2 26 01/27/2015 1628   CO2 26 03/22/2013 1015   CO2 24 11/30/2011 0748   CO2 29 10/14/2011 0937   BUN 24* 01/27/2015 1628   BUN 23.2 03/22/2013 1015   BUN 25* 11/30/2011 0748   BUN 14 10/14/2011 0937   CREATININE 1.34* 01/27/2015 1628   CREATININE 1.41* 10/21/2014 1020   CREATININE 1.3* 03/22/2013 1015   CREATININE 1.57* 11/30/2011 0748      Component Value Date/Time   CALCIUM 9.7 01/27/2015 1628   CALCIUM 9.8 03/22/2013 1015   CALCIUM 9.5 11/30/2011 0748   CALCIUM 9.2 10/14/2011 0937   ALKPHOS 116 10/21/2014 1020   ALKPHOS 102 03/22/2013 1015   ALKPHOS 136 11/30/2011 0748   ALKPHOS 109* 10/14/2011 0937   AST 29 10/21/2014 1020   AST 16 03/22/2013 1015   AST 25 11/30/2011 0748   AST 15 10/14/2011 0937   ALT 15 10/21/2014 1020   ALT 12 03/22/2013 1015   ALT 19 11/30/2011 0748   ALT 17 10/14/2011 0937   BILITOT 1.0 10/21/2014 1020   BILITOT 0.60 03/22/2013 1015   BILITOT  0.50 10/14/2011 0937      ASSESSMENT & PLAN:  Breast cancer, left breast Clinically, she has no evidence of recurrence. She missed having a screening mammogram since 2014. Her last imaging study show questionable lesion in the right breast. Clinical examination is negative. In the meantime, we'll continue anastrozole without interruption.  I have ordered a repeat mammogram on the right breast. If the result is negative, she can discontinue anastrozole next month.    No orders of the defined types were placed in this encounter.   All questions were answered. The patient knows to call the clinic with any problems, questions or concerns. No barriers to learning was detected. I spent 15 minutes counseling the patient face to face. The total time spent in the appointment was 20 minutes and more than 50% was on counseling and review of test results     [facility], [place], MD 02/14/2015 9:40 AM

## 2015-02-14 NOTE — Assessment & Plan Note (Signed)
Clinically, she has no evidence of recurrence. She missed having a screening mammogram since 2014. Her last imaging study show questionable lesion in the right breast. Clinical examination is negative. In the meantime, we'll continue anastrozole without interruption.  I have ordered a repeat mammogram on the right breast. If the result is negative, she can discontinue anastrozole next month.

## 2015-02-17 ENCOUNTER — Other Ambulatory Visit: Payer: Medicare HMO

## 2015-02-25 ENCOUNTER — Other Ambulatory Visit: Payer: Self-pay | Admitting: Cardiology

## 2015-03-02 ENCOUNTER — Ambulatory Visit (INDEPENDENT_AMBULATORY_CARE_PROVIDER_SITE_OTHER): Payer: Medicare HMO | Admitting: *Deleted

## 2015-03-02 DIAGNOSIS — I4891 Unspecified atrial fibrillation: Secondary | ICD-10-CM

## 2015-03-02 DIAGNOSIS — Z5181 Encounter for therapeutic drug level monitoring: Secondary | ICD-10-CM

## 2015-03-02 DIAGNOSIS — I2699 Other pulmonary embolism without acute cor pulmonale: Secondary | ICD-10-CM

## 2015-03-02 DIAGNOSIS — Z7901 Long term (current) use of anticoagulants: Secondary | ICD-10-CM

## 2015-03-02 LAB — POCT INR: INR: 3.2

## 2015-03-03 ENCOUNTER — Other Ambulatory Visit: Payer: Self-pay | Admitting: Cardiology

## 2015-03-09 ENCOUNTER — Ambulatory Visit (INDEPENDENT_AMBULATORY_CARE_PROVIDER_SITE_OTHER): Payer: Medicare HMO | Admitting: Neurology

## 2015-03-09 ENCOUNTER — Ambulatory Visit (INDEPENDENT_AMBULATORY_CARE_PROVIDER_SITE_OTHER): Payer: Self-pay | Admitting: Neurology

## 2015-03-09 ENCOUNTER — Encounter: Payer: Self-pay | Admitting: Neurology

## 2015-03-09 DIAGNOSIS — G629 Polyneuropathy, unspecified: Secondary | ICD-10-CM | POA: Diagnosis not present

## 2015-03-09 DIAGNOSIS — R202 Paresthesia of skin: Secondary | ICD-10-CM

## 2015-03-09 DIAGNOSIS — E1342 Other specified diabetes mellitus with diabetic polyneuropathy: Secondary | ICD-10-CM

## 2015-03-09 DIAGNOSIS — E1142 Type 2 diabetes mellitus with diabetic polyneuropathy: Secondary | ICD-10-CM

## 2015-03-09 HISTORY — DX: Type 2 diabetes mellitus with diabetic polyneuropathy: E11.42

## 2015-03-09 NOTE — Procedures (Signed)
     HISTORY:  [name] [name] is an 81 year old patient with a history of paresthesias on all 4 extremities. She has a history of diabetes and a history of prior lumbosacral spine surgery. The patient has reported some balance issues secondary to the foot numbness. She is being evaluated for possible neuropathy.  NERVE CONDUCTION STUDIES:  Nerve conduction studies were performed on the right upper extremity. The distal motor latencies and motor amplitudes for the right median and ulnar nerves were normal, with normal F wave latencies and nerve conduction velocities seen for these nerves. The right median and ulnar sensory latencies were normal.  Nerve conduction studies were performed on both lower extremities. The distal motor latencies for the peroneal nerves were normal bilaterally, with low motor amplitudes for these nerves bilaterally. No response was seen for the posterior tibial nerves bilaterally. The nerve conduction velocities for the peroneal nerves were slowed below the knee on the right, normal on the left, and normal above the knees bilaterally. The sensory latencies for the peroneal nerves were unobtainable bilaterally. The H reflex latencies were unobtainable bilaterally.  EMG STUDIES:  EMG study was performed on the right lower extremity:  The tibialis anterior muscle reveals 2 to 4K motor units with moderately decreased recruitment. No fibrillations or positive waves were seen. The peroneus tertius muscle reveals 1 to 3K motor units with moderately decreased recruitment. No fibrillations or positive waves were seen. The medial gastrocnemius muscle reveals 1 to 3K motor units with decreased recruitment. 1+ positive waves were seen. The vastus lateralis muscle reveals 2 to 4K motor units with full recruitment. No fibrillations or positive waves were seen. The iliopsoas muscle reveals 2 to 4K motor units with full recruitment. No fibrillations or positive waves were seen. The  biceps femoris muscle (long head) reveals 2 to 3K motor units with full recruitment. No fibrillations or positive waves were seen. The lumbosacral paraspinal muscles were tested at 3 levels, and revealed no abnormalities of insertional activity at all 3 levels tested. There was good relaxation.   IMPRESSION:  Nerve conduction studies done on the right upper extremity and both lower extremities shows evidence of a primarily axonal peripheral neuropathy of moderate to severe severity. EMG evaluation of the right lower extremity shows predominately distal, chronic signs of denervation consistent with the diagnosis of peripheral neuropathy. There is no evidence of an active overlying lumbosacral radiculopathy.  Jill Alexanders MD 03/09/2015 4:45 PM  [facility] [address] [place], [place] [address]  Phone [phone] Fax [phone]

## 2015-03-09 NOTE — Progress Notes (Signed)
Please refer to EMG and nerve conduction study procedure note. 

## 2015-03-16 ENCOUNTER — Ambulatory Visit (INDEPENDENT_AMBULATORY_CARE_PROVIDER_SITE_OTHER): Payer: Medicare HMO | Admitting: *Deleted

## 2015-03-16 ENCOUNTER — Encounter: Payer: Self-pay | Admitting: Internal Medicine

## 2015-03-16 ENCOUNTER — Ambulatory Visit (INDEPENDENT_AMBULATORY_CARE_PROVIDER_SITE_OTHER): Payer: Medicare HMO | Admitting: Internal Medicine

## 2015-03-16 VITALS — BP 120/68 | HR 63 | Ht 72.0 in | Wt 230.4 lb

## 2015-03-16 DIAGNOSIS — I495 Sick sinus syndrome: Secondary | ICD-10-CM | POA: Diagnosis not present

## 2015-03-16 DIAGNOSIS — Z7901 Long term (current) use of anticoagulants: Secondary | ICD-10-CM | POA: Diagnosis not present

## 2015-03-16 DIAGNOSIS — I4891 Unspecified atrial fibrillation: Secondary | ICD-10-CM | POA: Diagnosis not present

## 2015-03-16 DIAGNOSIS — Z5181 Encounter for therapeutic drug level monitoring: Secondary | ICD-10-CM

## 2015-03-16 DIAGNOSIS — I2699 Other pulmonary embolism without acute cor pulmonale: Secondary | ICD-10-CM

## 2015-03-16 LAB — CUP PACEART INCLINIC DEVICE CHECK
Brady Statistic AP VP Percent: 1 %
Brady Statistic AP VS Percent: 96 %
Brady Statistic AS VP Percent: 0 %
Lead Channel Impedance Value: 494 Ohm
Lead Channel Pacing Threshold Amplitude: 0.75 V
Lead Channel Pacing Threshold Amplitude: 1.75 V
Lead Channel Pacing Threshold Pulse Width: 0.4 ms
Lead Channel Setting Pacing Amplitude: 2 V
Lead Channel Setting Sensing Sensitivity: 4 mV
MDC IDC MSMT BATTERY IMPEDANCE: 454 Ohm
MDC IDC MSMT BATTERY REMAINING LONGEVITY: 83 mo
MDC IDC MSMT BATTERY VOLTAGE: 2.79 V
MDC IDC MSMT LEADCHNL RA IMPEDANCE VALUE: 410 Ohm
MDC IDC MSMT LEADCHNL RV PACING THRESHOLD PULSEWIDTH: 0.76 ms
MDC IDC SESS DTM: 20160616101027
MDC IDC SET LEADCHNL RV PACING AMPLITUDE: 5 V
MDC IDC SET LEADCHNL RV PACING PULSEWIDTH: 0.4 ms
MDC IDC STAT BRADY AS VS PERCENT: 2 %

## 2015-03-16 LAB — POCT INR: INR: 4.2

## 2015-03-16 MED ORDER — FLECAINIDE ACETATE 100 MG PO TABS: 50.0000 mg | ORAL_TABLET | Freq: Two times a day (BID) | ORAL | Status: DC

## 2015-03-16 NOTE — Progress Notes (Signed)
PCP:  Foye Spurling, MD Primary Cardiologist:  Dr Ron Parker  The patient presents today for routine electrophysiology followup.  Since last being seen in our clinic, the patient reports doing reasonably well.  She remains very active despite her age.  Today, she denies symptoms of palpitations, chest pain, shortness of breath, orthopnea, PND, lower extremity edema, dizziness, presyncope, syncope, or neurologic sequela.  The patient feels that she is tolerating medications without difficulties and is otherwise without complaint today.   Past Medical History  Diagnosis Date  . Hypertension   . Diabetes mellitus   . Tachy-brady syndrome     s/p pacer 07/2009  . Atrial fibrillation     flecainide; coumadin  . Atrial flutter     November, 2010, flecainide, Coumadin  . Chronic diastolic heart failure     echo 10/10: mild LVH, EF 55-60%, mild AI, mild MR, mild to mod LAE, mild RVE, severe RAE, mod TR, PASP 53;  Echo 3/14:  Mild LVH, EF 55%, Tr AI, MAC, mild MR, mod LAE, PASP 31, trivial eff.  . Shortness of breath     myoview 11/10: EF 65%, no ischemia  . Pulmonary HTN     53 mmHg echo, 2010, moderate TR, mild right ventricular enlargement  . DVT (deep venous thrombosis)     Pulmonary embolus  . Pulmonary embolus 2006    2006, with DVT  . Carotid artery disease     [id number]: RICA [id number], LICA [id number]  . Thyroid nodule     non-neoplastic goiter  . Breast cancer   . Ejection fraction     EF 60%, echo, October, 2010  . Warfarin anticoagulation     Atrial fibrillation  . Pacemaker     November, 2010, Dr Rayann Heman - MDT ADDRL1 Adapta DC PPM ser #: [id number]    . S/P laminectomy     June 22, 2011  . Syncope     2010, treated with pacemaker  . Drug therapy     FLECAINIDE  . Mitral regurgitation     mild, echo, October, 2010  . Mediastinal lymphadenopathy   . Venous insufficiency     Chronic  . Hypokalemia     October, 2012, potassium dose increased  . Hiatal hernia 2006    EGD  .  Acute gastric ulcer without mention of hemorrhage, perforation, or obstruction 2006    EGD  . Esophagitis, unspecified 2006    EGD  . Allergy     seasonal  . Diverticulosis   . Hx of adenomatous colonic polyps   . Fecal incontinence   . Breast cancer   . Osteopenia   . Diabetic peripheral neuropathy 03/09/2015   Past Surgical History  Procedure Laterality Date  . Breast surgery      Left  . Abdominal hysterectomy    . Back surgery      1973, 2012   . Ankle surgery      Current Outpatient Prescriptions  Medication Sig Dispense Refill  . ALPRAZolam (XANAX) 0.25 MG tablet Take 0.25 mg by mouth daily as needed for anxiety.     . bethanechol (URECHOLINE) 25 MG tablet Take 25 mg by mouth daily.    Marland Kitchen dicyclomine (BENTYL) 10 MG capsule Take 10 mg by mouth 2 (two) times daily.    . flecainide (TAMBOCOR) 100 MG tablet TAKE 1 TABLET (100 MG TOTAL) BY MOUTH 2 (TWO) TIMES DAILY. 60 tablet 5  . furosemide (LASIX) 40 MG tablet TAKE 1 TABLET (40 MG TOTAL) BY  MOUTH 2 (TWO) TIMES DAILY. 60 tablet 5  . GABAPENTIN PO Take 200 mg by mouth 3 (three) times daily.    . metFORMIN (GLUCOPHAGE) 500 MG tablet Take 750 mg by mouth 2 (two) times daily with a meal.    . metoprolol succinate (TOPROL-XL) 25 MG 24 hr tablet Take 1/2 (12.5 mg) tablet by mouth daily 45 tablet 3  . potassium chloride 20 MEQ/15ML (10%) solution Take 20 mEq by mouth daily.     Marland Kitchen pyridoxine (B-6) 100 MG tablet Take 200 mg by mouth daily.     . vitamin B-12 (CYANOCOBALAMIN) 500 MCG tablet Take 500 mcg by mouth daily.     Marland Kitchen warfarin (COUMADIN) 5 MG tablet TAKE 1 TABLET (5 MG TOTAL) BY MOUTH ONE TIME ONLY AT 6 PM. 30 tablet 3   Current Facility-Administered Medications  Medication Dose Route Frequency Provider Last Rate Last Dose  . promethazine (PHENERGAN) tablet 12.5 mg  12.5 mg Oral Q8H PRN Carlena Bjornstad, MD        Allergies  Allergen Reactions  . Morphine Other (See Comments)    Family requests no morphine due to past  reaction. Pt stated it made her sick.  . Morphine And Related Other (See Comments)    Family requests no morphine due to past reaction. Pt stated it made her sick.    History   Social History  . Marital Status: Widowed    Spouse Name: N/A  . Number of Children: 3  . Years of Education: N/A   Occupational History  . Retired     Social History Main Topics  . Smoking status: Former Smoker -- 4.00 packs/day    Quit date: 10/26/1998  . Smokeless tobacco: Never Used  . Alcohol Use: No  . Drug Use: No  . Sexual Activity: Not on file   Other Topics Concern  . Not on file   Social History Narrative   Widowed.  Lives in [place] by herself.  Avoids caffeine.  Very active, taking care of her brother who also lives by himself and is in his late 56's.    Family History  Problem Relation Age of Onset  . Heart disease Mother   . COPD Father   . Colon cancer Neg Hx     ROS-  All systems are reviewed and are negative except as outlined in the HPI above  Physical Exam: Filed Vitals:   03/16/15 0838  BP: 120/68  Pulse: 63  Height: 6' (1.829 m)  Weight: 104.509 kg (230 lb 6.4 oz)    GEN- The patient is well appearing, alert and oriented x 3 today.   Head- normocephalic, atraumatic Eyes-  Sclera clear, conjunctiva pink Ears- hearing intact Oropharynx- clear Neck- supple, no JVP Lymph- no cervical lymphadenopathy Lungs- Clear to ausculation bilaterally, normal work of breathing Chest- pacemaker pocket is well healed Heart- Regular rate and rhythm, no murmurs, rubs or gallops, PMI not laterally displaced GI- soft, NT, ND, + BS Extremities- no clubbing, cyanosis, or edema   Pacemaker interrogation- reviewed in detail today,  See PACEART report  ekg today reveals atrial pacing with long first degree AV block  Assessment and Plan:  1. Sick sinus syndrome Normal pacemaker function though RV threshold is chronically elevated.  She does not V pace. See Claudia Desanctis Art report No changes  today We had extensive conversations with her about remote monitoring which she does not remember. We even enrolled in pilot [employer] study.  I worry that she  may have a degree of cognative impairment.  I am not certain that she can do remote monitoring going forward  2. Atrial fibrillation Well controlled Continue long term anticoagulation She is on flecainide 100mg  BID though Dr Ron Parker is clear in his last note that she should be on 50mg  BID I will reduce today to 50mg  BID, particularly given first degree AV block and chronically elevated V threshold on PPM  3. HTN Stable No change required today  Return to see EP nurse in 6 months, EP NP in 1 year Follow-up with Dr Ron Parker as scheduled

## 2015-03-16 NOTE — Patient Instructions (Signed)
Medication Instructions:  Your physician has recommended you make the following change in your medication:  1) Decrease Flecainide 50 mg twice daily   Labwork: None ordered  Testing/Procedures: None ordered  Follow-Up: Your physician wants you to follow-up in: 6 months in the device clinic and 12 months with Chanetta Marshall, NP You will receive a reminder letter in the mail two months in advance. If you don't receive a letter, please call our office to schedule the follow-up appointment.   Any Other Special Instructions Will Be Listed Below (If Applicable).

## 2015-03-27 ENCOUNTER — Other Ambulatory Visit: Payer: Self-pay

## 2015-03-30 ENCOUNTER — Ambulatory Visit: Payer: Medicare HMO | Admitting: *Deleted

## 2015-03-30 DIAGNOSIS — I2699 Other pulmonary embolism without acute cor pulmonale: Secondary | ICD-10-CM | POA: Diagnosis not present

## 2015-03-30 DIAGNOSIS — Z7901 Long term (current) use of anticoagulants: Secondary | ICD-10-CM | POA: Diagnosis not present

## 2015-03-30 DIAGNOSIS — Z5181 Encounter for therapeutic drug level monitoring: Secondary | ICD-10-CM | POA: Diagnosis not present

## 2015-03-30 DIAGNOSIS — I4891 Unspecified atrial fibrillation: Secondary | ICD-10-CM

## 2015-03-30 LAB — POCT INR: INR: 2.2

## 2015-04-13 ENCOUNTER — Ambulatory Visit (INDEPENDENT_AMBULATORY_CARE_PROVIDER_SITE_OTHER): Payer: Medicare HMO | Admitting: *Deleted

## 2015-04-13 DIAGNOSIS — I4891 Unspecified atrial fibrillation: Secondary | ICD-10-CM | POA: Diagnosis not present

## 2015-04-13 DIAGNOSIS — Z5181 Encounter for therapeutic drug level monitoring: Secondary | ICD-10-CM | POA: Diagnosis not present

## 2015-04-13 DIAGNOSIS — I2699 Other pulmonary embolism without acute cor pulmonale: Secondary | ICD-10-CM

## 2015-04-13 DIAGNOSIS — Z7901 Long term (current) use of anticoagulants: Secondary | ICD-10-CM | POA: Diagnosis not present

## 2015-04-13 LAB — POCT INR: INR: 2.3

## 2015-04-19 ENCOUNTER — Emergency Department (HOSPITAL_COMMUNITY)
Admission: EM | Admit: 2015-04-19 | Discharge: 2015-04-19 | Disposition: A | Discharge status: Home / Self Care | Payer: Medicare HMO | Attending: Emergency Medicine | Admitting: Emergency Medicine

## 2015-04-19 ENCOUNTER — Emergency Department (HOSPITAL_COMMUNITY): Payer: Medicare HMO

## 2015-04-19 ENCOUNTER — Encounter (HOSPITAL_COMMUNITY): Payer: Self-pay

## 2015-04-19 DIAGNOSIS — Z86711 Personal history of pulmonary embolism: Secondary | ICD-10-CM | POA: Diagnosis not present

## 2015-04-19 DIAGNOSIS — Z86010 Personal history of colonic polyps: Secondary | ICD-10-CM | POA: Insufficient documentation

## 2015-04-19 DIAGNOSIS — I10 Essential (primary) hypertension: Secondary | ICD-10-CM | POA: Insufficient documentation

## 2015-04-19 DIAGNOSIS — I4891 Unspecified atrial fibrillation: Secondary | ICD-10-CM | POA: Diagnosis not present

## 2015-04-19 DIAGNOSIS — Z950 Presence of cardiac pacemaker: Secondary | ICD-10-CM

## 2015-04-19 DIAGNOSIS — Z7901 Long term (current) use of anticoagulants: Secondary | ICD-10-CM | POA: Insufficient documentation

## 2015-04-19 DIAGNOSIS — I4892 Unspecified atrial flutter: Secondary | ICD-10-CM | POA: Insufficient documentation

## 2015-04-19 DIAGNOSIS — Z853 Personal history of malignant neoplasm of breast: Secondary | ICD-10-CM | POA: Diagnosis not present

## 2015-04-19 DIAGNOSIS — M542 Cervicalgia: Secondary | ICD-10-CM | POA: Diagnosis not present

## 2015-04-19 DIAGNOSIS — I5032 Chronic diastolic (congestive) heart failure: Secondary | ICD-10-CM | POA: Diagnosis not present

## 2015-04-19 DIAGNOSIS — I495 Sick sinus syndrome: Secondary | ICD-10-CM

## 2015-04-19 DIAGNOSIS — E1342 Other specified diabetes mellitus with diabetic polyneuropathy: Secondary | ICD-10-CM | POA: Diagnosis not present

## 2015-04-19 DIAGNOSIS — Z8719 Personal history of other diseases of the digestive system: Secondary | ICD-10-CM | POA: Diagnosis not present

## 2015-04-19 DIAGNOSIS — R079 Chest pain, unspecified: Secondary | ICD-10-CM | POA: Diagnosis not present

## 2015-04-19 DIAGNOSIS — R0789 Other chest pain: Secondary | ICD-10-CM

## 2015-04-19 DIAGNOSIS — Z86718 Personal history of other venous thrombosis and embolism: Secondary | ICD-10-CM | POA: Diagnosis not present

## 2015-04-19 LAB — BASIC METABOLIC PANEL
Anion gap: 11 (ref 5–15)
BUN: 22 mg/dL — ABNORMAL HIGH (ref 6–20)
CALCIUM: 9.5 mg/dL (ref 8.9–10.3)
CO2: 27 mmol/L (ref 22–32)
CREATININE: 1.7 mg/dL — AB (ref 0.44–1.00)
Chloride: 100 mmol/L — ABNORMAL LOW (ref 101–111)
GFR calc Af Amer: 31 mL/min — ABNORMAL LOW (ref >60)
GFR, EST NON AFRICAN AMERICAN: 27 mL/min — AB (ref >60)
Glucose, Bld: 119 mg/dL — ABNORMAL HIGH (ref 65–99)
POTASSIUM: 4.6 mmol/L (ref 3.5–5.1)
Sodium: 138 mmol/L (ref 135–145)

## 2015-04-19 LAB — CBC WITH DIFFERENTIAL/PLATELET
BASOS PCT: 1 % (ref 0–1)
Basophils Absolute: 0.1 10*3/uL (ref 0.0–0.1)
Eosinophils Absolute: 0 10*3/uL (ref 0.0–0.7)
Eosinophils Relative: 1 % (ref 0–5)
HCT: 35.6 % — ABNORMAL LOW (ref 36.0–46.0)
Hemoglobin: 11.3 g/dL — ABNORMAL LOW (ref 12.0–15.0)
LYMPHS ABS: 0.9 10*3/uL (ref 0.7–4.0)
Lymphocytes Relative: 17 % (ref 12–46)
MCH: 28.4 pg (ref 26.0–34.0)
MCHC: 31.7 g/dL (ref 30.0–36.0)
MCV: 89.4 fL (ref 78.0–100.0)
MONO ABS: 0.3 10*3/uL (ref 0.1–1.0)
Monocytes Relative: 7 % (ref 3–12)
Neutro Abs: 3.8 10*3/uL (ref 1.7–7.7)
Neutrophils Relative %: 74 % (ref 43–77)
Platelets: 304 10*3/uL (ref 150–400)
RBC: 3.98 MIL/uL (ref 3.87–5.11)
RDW: 15.4 % (ref 11.5–15.5)
WBC: 5.1 10*3/uL (ref 4.0–10.5)

## 2015-04-19 LAB — PROTIME-INR
INR: 1.93 — ABNORMAL HIGH (ref 0.00–1.49)
PROTHROMBIN TIME: 22 s — AB (ref 11.6–15.2)

## 2015-04-19 LAB — TROPONIN I: Troponin I: <0.03 ng/mL (ref <0.031)

## 2015-04-19 NOTE — Discharge Instructions (Signed)
Follow-up closely with cardiology in the next 48 hours. If pain worsens is constant, you develop sweating or exertional component return to the ER immediately.  If you were given medicines take as directed.  If you are on coumadin or contraceptives realize their levels and effectiveness is altered by many different medicines.  If you have any reaction (rash, tongues swelling, other) to the medicines stop taking and see a physician.    If your blood pressure was elevated in the ER make sure you follow up for management with a primary doctor or return for chest pain, shortness of breath or stroke symptoms.  Please follow up as directed and return to the ER or see a physician for new or worsening symptoms.  Thank you. Filed Vitals:   04/19/15 0915 04/19/15 1015 04/19/15 1030 04/19/15 1100  BP: 144/57 162/58 152/66 163/64  Pulse: 60 63 60 60  Temp:      TempSrc:      Resp: 14 16 17 14   SpO2: 94% 98% 97% 97%

## 2015-04-19 NOTE — Progress Notes (Signed)
CARDIOLOGY CONSULT NOTE     Patient ID: [name] [name] MRN: [id number] DOB/AGE: Feb 18, 1931 81 y.o.  Admit date: 04/19/2015 Referring Physician Elnora Morrison MD Primary Physician Foye Spurling, MD Primary Cardiologist Thompson Grayer MD Reason for Consultation chest pain.  HPI: 81 yo WF with history of HTN, DM, atrial fibrillation, and prior pacemaker. She presented to the ED today for evaluation of chest pain. She was seen in the office one month ago and pacemaker follow up was good. She states she felt great up until this am when she awoke and noted a pain in the left anterior chest around her pacemaker. Pain radiated into left shoulder. Pain was worse lifting her left arm and arm motion was limited. She place a hot towel on it and it seemed to improve but when the towel cooled the pain came back. She became anxious and tearful and her family told her to contact EMS and come to the ED. She does have pain radiating into base of left neck to ear. She thinks she may have just slept on it wrong. She doesn't remember doing any lifting or straining.   Past Medical History  Diagnosis Date  . Hypertension   . Diabetes mellitus   . Tachy-brady syndrome     s/p pacer 07/2009  . Atrial fibrillation     flecainide; coumadin  . Atrial flutter     November, 2010, flecainide, Coumadin  . Chronic diastolic heart failure     echo 10/10: mild LVH, EF 55-60%, mild AI, mild MR, mild to mod LAE, mild RVE, severe RAE, mod TR, PASP 53;  Echo 3/14:  Mild LVH, EF 55%, Tr AI, MAC, mild MR, mod LAE, PASP 31, trivial eff.  . Shortness of breath     myoview 11/10: EF 65%, no ischemia  . Pulmonary HTN     53 mmHg echo, 2010, moderate TR, mild right ventricular enlargement  . DVT (deep venous thrombosis)     Pulmonary embolus  . Pulmonary embolus 2006    2006, with DVT  . Carotid artery disease     [id number]: RICA [id number], LICA [id number]  . Thyroid nodule     non-neoplastic goiter  . Breast cancer   . Ejection  fraction     EF 60%, echo, October, 2010  . Warfarin anticoagulation     Atrial fibrillation  . Pacemaker     November, 2010, Dr Rayann Heman - MDT ADDRL1 Adapta DC PPM ser #: [id number]    . S/P laminectomy     June 22, 2011  . Syncope     2010, treated with pacemaker  . Drug therapy     FLECAINIDE  . Mitral regurgitation     mild, echo, October, 2010  . Mediastinal lymphadenopathy   . Venous insufficiency     Chronic  . Hypokalemia     October, 2012, potassium dose increased  . Hiatal hernia 2006    EGD  . Acute gastric ulcer without mention of hemorrhage, perforation, or obstruction 2006    EGD  . Esophagitis, unspecified 2006    EGD  . Allergy     seasonal  . Diverticulosis   . Hx of adenomatous colonic polyps   . Fecal incontinence   . Breast cancer   . Osteopenia   . Diabetic peripheral neuropathy 03/09/2015    Family History  Problem Relation Age of Onset  . Heart disease Mother   . COPD Father   . Colon cancer Neg Hx  History   Social History  . Marital Status: Widowed    Spouse Name: N/A  . Number of Children: 3  . Years of Education: N/A   Occupational History  . Retired     Social History Main Topics  . Smoking status: Former Smoker -- 4.00 packs/day    Quit date: 10/26/1998  . Smokeless tobacco: Never Used  . Alcohol Use: No  . Drug Use: No  . Sexual Activity: Not on file   Other Topics Concern  . Not on file   Social History Narrative   Widowed.  Lives in [place] by herself.  Avoids caffeine.  Very active, taking care of her brother who also lives by himself and is in his late 52's.    Past Surgical History  Procedure Laterality Date  . Breast surgery      Left  . Abdominal hysterectomy    . Back surgery      1973, 2012   . Ankle surgery         ROS: As noted in HPI. All other systems are reviewed and are negative unless otherwise mentioned.   Physical Exam: Blood pressure 160/52, pulse 58, temperature 97.6 F (36.4 C),  temperature source Oral, resp. rate 18, SpO2 92 %. Current Weight  03/16/15 104.509 kg (230 lb 6.4 oz)  02/14/15 104.373 kg (230 lb 1.6 oz)  01/27/15 102.694 kg (226 lb 6.4 oz)    GENERAL:  Well appearing, elderly WF, anxious HEENT:  PERRL, EOMI, sclera are clear. Oropharynx is clear. NECK:  No jugular venous distention, carotid upstroke brisk and symmetric, no bruits, no thyromegaly or adenopathy LUNGS:  Clear to auscultation bilaterally CHEST:  Marked pain on palpation left anterior chest especially the parasternal region. No fluctuance or redness. Pacemaker site looks normal.  HEART:  RRR,  PMI not displaced or sustained,S1 and S2 within normal limits, no S3, no S4: no clicks, no rubs, no murmurs ABD:  Soft, nontender. BS +, no masses or bruits. No hepatomegaly, no splenomegaly EXT:  2 + pulses throughout, no edema, no cyanosis no clubbing SKIN:  Warm and dry.  No rashes NEURO:  Alert and oriented x 3. Cranial nerves II through XII intact. PSYCH:  Cognitively intact    Labs:   Lab Results  Component Value Date   WBC 5.1 04/19/2015   HGB 11.3* 04/19/2015   HCT 35.6* 04/19/2015   MCV 89.4 04/19/2015   PLT 304 04/19/2015    Recent Labs Lab 04/19/15 1027  NA 138  K 4.6  CL 100*  CO2 27  BUN 22*  CREATININE 1.70*  CALCIUM 9.5  GLUCOSE 119*   Lab Results  Component Value Date   CKTOTAL 24 06/26/2011   CKTOTAL 60 06/18/2011   CKTOTAL 62 06/18/2011   CKMB 1.8 06/26/2011   CKMB 1.8 06/18/2011   CKMB 1.8 06/18/2011   TROPONINI <0.03 04/19/2015   TROPONINI <0.03 04/19/2015   TROPONINI <0.30 08/27/2014   No results found for: CHOL No results found for: HDL No results found for: LDLCALC No results found for: TRIG No results found for: CHOLHDL No results found for: LDLDIRECT  Lab Results  Component Value Date   PROBNP 825.8* 08/27/2014   PROBNP 915.2* 08/03/2014   PROBNP 211.0* 03/17/2013   Lab Results  Component Value Date   TSH 0.654 12/13/2012   Lab Results   Component Value Date   HGBA1C 6.2* 08/03/2014    Radiology: Dg Chest 2 View  04/19/2015   CLINICAL DATA:  81 year old female with a history of pacemaker site complaint. Pain at the left clavicle and neck.  EXAM: CHEST - 2 VIEW  COMPARISON:  Plain film 08/27/2014, 03/20/2013, prior CT 10/14/2011  FINDINGS: Cardiomediastinal silhouette is unchanged.  Density at the lower mediastinum is unchanged. Tortuosity of the descending thoracic aorta.  Unchanged position of cardiac pacing device on left chest wall with leads projecting over the right atrium and right ventricle.  Atherosclerotic calcifications of the aortic arch.  Coarsened interstitial markings, similar to prior. No confluent airspace disease or pneumothorax. Surgical clips in the left axilla again noted.  No displaced fracture.  Healed fracture of the right chest wall.  IMPRESSION: No radiographic evidence of acute cardiopulmonary disease with chronic changes of the lungs.  Unchanged position of the pacing device on left chest wall with 2 leads, unchanged.  Atherosclerosis.  Hiatal hernia again demonstrated.  Posttraumatic deformity of the right chest wall, similar to prior.  Signed,  Dulcy Fanny. Earleen Newport, DO  [facility]  [facility]   Electronically Signed   By: Corrie Mckusick D.O.   On: 04/19/2015 09:39    EKG: atrial paced rhythm, otherwise normal. I have personally reviewed and interpreted this study.   ASSESSMENT AND PLAN:  1. Musculoskeletal chest wall pain. Clearly reproduced on exam. Recommend Tylenol for pain/analgesia. (shouldn't take NSAIDs on coumadin). Local heat. Rest. No evidence of any active cardiac issues and pacemaker site is normal 2. SSS with history of Afib. In NSR/atrial pacing on Flecainide.  3. Chronic anticoagulation 4. HTN- controlled.  5. DM  Plan: OK for DC from ED from a cardiac standpoint. Follow up with primary care for continued pain.   Signed: Netanya Yazdani [place],  [place]  04/19/2015, 2:23 PM

## 2015-04-19 NOTE — ED Notes (Signed)
Pt. Presents with complaint to pacemaker site. Pt. Woke up this AM with pain around L clavicle and to L neck and pain with lifting L arm. EMS noted no temperature or color difference to area. Pt. Saw cardiologist approx 1 month ago and was told it needed to be replaced. Pacer placed approx. 10 years ago per pt. Pt. Is atrial demand paced.

## 2015-04-19 NOTE — ED Provider Notes (Addendum)
CSN: [id number]     Arrival date & time 04/19/15  0854 History   First MD Initiated Contact with Patient 04/19/15 [id number]     Chief Complaint  Patient presents with  . pain at pacer site      (Consider location/radiation/quality/duration/timing/severity/associated sxs/prior Treatment) HPI Comments: 81 year old female history of diabetes, atrial fibrillation, DVT on Coumadin, pacemaker placed years ago follows with Dr. Rayann Heman, diastolic heart failure presents with left upper neck and chest discomfort. No exertional component, no diaphoresis, patient has had discomfort since 3:00 this morning. Patient has outpatient follow-up. Patient felt she slept wrong and the pain is positional and worse with palpation. Patient has not had issues with the pacemaker site in the past. No fevers or chills no redness at the site.  The history is provided by the patient.    Past Medical History  Diagnosis Date  . Hypertension   . Diabetes mellitus   . Tachy-brady syndrome     s/p pacer 07/2009  . Atrial fibrillation     flecainide; coumadin  . Atrial flutter     November, 2010, flecainide, Coumadin  . Chronic diastolic heart failure     echo 10/10: mild LVH, EF 55-60%, mild AI, mild MR, mild to mod LAE, mild RVE, severe RAE, mod TR, PASP 53;  Echo 3/14:  Mild LVH, EF 55%, Tr AI, MAC, mild MR, mod LAE, PASP 31, trivial eff.  . Shortness of breath     myoview 11/10: EF 65%, no ischemia  . Pulmonary HTN     53 mmHg echo, 2010, moderate TR, mild right ventricular enlargement  . DVT (deep venous thrombosis)     Pulmonary embolus  . Pulmonary embolus 2006    2006, with DVT  . Carotid artery disease     [id number]: RICA [id number], LICA [id number]  . Thyroid nodule     non-neoplastic goiter  . Breast cancer   . Ejection fraction     EF 60%, echo, October, 2010  . Warfarin anticoagulation     Atrial fibrillation  . Pacemaker     November, 2010, Dr Rayann Heman - MDT ADDRL1 Adapta DC PPM ser #: [id number]    . S/P  laminectomy     June 22, 2011  . Syncope     2010, treated with pacemaker  . Drug therapy     FLECAINIDE  . Mitral regurgitation     mild, echo, October, 2010  . Mediastinal lymphadenopathy   . Venous insufficiency     Chronic  . Hypokalemia     October, 2012, potassium dose increased  . Hiatal hernia 2006    EGD  . Acute gastric ulcer without mention of hemorrhage, perforation, or obstruction 2006    EGD  . Esophagitis, unspecified 2006    EGD  . Allergy     seasonal  . Diverticulosis   . Hx of adenomatous colonic polyps   . Fecal incontinence   . Breast cancer   . Osteopenia   . Diabetic peripheral neuropathy 03/09/2015   Past Surgical History  Procedure Laterality Date  . Breast surgery      Left  . Abdominal hysterectomy    . Back surgery      1973, 2012   . Ankle surgery     Family History  Problem Relation Age of Onset  . Heart disease Mother   . COPD Father   . Colon cancer Neg Hx    History  Substance Use Topics  . Smoking status: Former  Smoker -- 4.00 packs/day    Quit date: 10/26/1998  . Smokeless tobacco: Never Used  . Alcohol Use: No   OB History    No data available     Review of Systems  Constitutional: Negative for fever and chills.  HENT: Negative for congestion.   Eyes: Negative for visual disturbance.  Respiratory: Negative for shortness of breath.   Cardiovascular: Positive for chest pain.  Gastrointestinal: Negative for vomiting and abdominal pain.  Genitourinary: Negative for dysuria and flank pain.  Musculoskeletal: Positive for neck pain. Negative for back pain and neck stiffness.  Skin: Negative for rash.  Neurological: Negative for light-headedness and headaches.      Allergies  Morphine and Morphine and related  Home Medications   Prior to Admission medications   Medication Sig Start Date End Date Taking? Authorizing Provider  ALPRAZolam Duanne Moron) 0.25 MG tablet Take 0.25 mg by mouth daily as needed for anxiety.     Yes Historical Provider, MD  bethanechol (URECHOLINE) 25 MG tablet Take 25 mg by mouth 2 (two) times daily.    Yes Historical Provider, MD  flecainide (TAMBOCOR) 100 MG tablet Take 0.5 tablets (50 mg total) by mouth 2 (two) times daily. 03/16/15  Yes Thompson Grayer, MD  furosemide (LASIX) 40 MG tablet TAKE 1 TABLET (40 MG TOTAL) BY MOUTH 2 (TWO) TIMES DAILY. 02/28/15  Yes Carlena Bjornstad, MD  GABAPENTIN PO Take 200 mg by mouth 3 (three) times daily.   Yes Historical Provider, MD  metFORMIN (GLUCOPHAGE) 500 MG tablet Take 750 mg by mouth 2 (two) times daily with a meal.   Yes Historical Provider, MD  metoprolol succinate (TOPROL-XL) 25 MG 24 hr tablet Take 1/2 (12.5 mg) tablet by mouth daily 02/03/15  Yes Carlena Bjornstad, MD  potassium chloride 20 MEQ/15ML (10%) solution Take 20 mEq by mouth daily.    Yes Historical Provider, MD  pyridoxine (B-6) 100 MG tablet Take 200 mg by mouth daily.    Yes Historical Provider, MD  vitamin B-12 (CYANOCOBALAMIN) 500 MCG tablet Take 500 mcg by mouth daily.    Yes Historical Provider, MD  warfarin (COUMADIN) 5 MG tablet TAKE 1 TABLET (5 MG TOTAL) BY MOUTH ONE TIME ONLY AT 6 PM. 12/19/14  Yes Carlena Bjornstad, MD   BP 160/52 mmHg  Pulse 58  Temp(Src) 97.6 F (36.4 C) (Oral)  Resp 18  SpO2 92% Physical Exam  Constitutional: She is oriented to person, place, and time. She appears well-developed and well-nourished.  HENT:  Head: Normocephalic and atraumatic.  Eyes: Conjunctivae are normal. Right eye exhibits no discharge. Left eye exhibits no discharge.  Neck: Normal range of motion. Neck supple. No tracheal deviation present.  Cardiovascular: Normal rate, regular rhythm and intact distal pulses.   Pulmonary/Chest: Effort normal and breath sounds normal.  Abdominal: Soft. She exhibits no distension. There is no tenderness. There is no guarding.  Musculoskeletal: She exhibits tenderness (mild tender left lateral pectoralis muscle, pacemaker site no erythema no swelling no  warmth). She exhibits no edema.  Neurological: She is alert and oriented to person, place, and time.  Skin: Skin is warm. No rash noted.  Psychiatric: She has a normal mood and affect.  Nursing note and vitals reviewed.   ED Course  Procedures (including critical care time) Labs Review Labs Reviewed  BASIC METABOLIC PANEL - Abnormal; Notable for the following:    Chloride 100 (*)    Glucose, Bld 119 (*)    BUN 22 (*)  Creatinine, Ser 1.70 (*)    GFR calc non Af Amer 27 (*)    GFR calc Af Amer 31 (*)    All other components within normal limits  CBC WITH DIFFERENTIAL/PLATELET - Abnormal; Notable for the following:    Hemoglobin 11.3 (*)    HCT 35.6 (*)    All other components within normal limits  PROTIME-INR - Abnormal; Notable for the following:    Prothrombin Time 22.0 (*)    INR 1.93 (*)    All other components within normal limits  TROPONIN I  TROPONIN I    Imaging Review Dg Chest 2 View  04/19/2015   CLINICAL DATA:  81 year old female with a history of pacemaker site complaint. Pain at the left clavicle and neck.  EXAM: CHEST - 2 VIEW  COMPARISON:  Plain film 08/27/2014, 03/20/2013, prior CT 10/14/2011  FINDINGS: Cardiomediastinal silhouette is unchanged.  Density at the lower mediastinum is unchanged. Tortuosity of the descending thoracic aorta.  Unchanged position of cardiac pacing device on left chest wall with leads projecting over the right atrium and right ventricle.  Atherosclerotic calcifications of the aortic arch.  Coarsened interstitial markings, similar to prior. No confluent airspace disease or pneumothorax. Surgical clips in the left axilla again noted.  No displaced fracture.  Healed fracture of the right chest wall.  IMPRESSION: No radiographic evidence of acute cardiopulmonary disease with chronic changes of the lungs.  Unchanged position of the pacing device on left chest wall with 2 leads, unchanged.  Atherosclerosis.  Hiatal hernia again demonstrated.   Posttraumatic deformity of the right chest wall, similar to prior.  Signed,  Dulcy Fanny. Earleen Newport, DO  [facility]  [facility]   Electronically Signed   By: Corrie Mckusick D.O.   On: 04/19/2015 09:39     EKG Interpretation None     EKG reviewed by myself sinus, heart rate 61, poor baseline/artifact, no acute ST elevation or depression, normal QT  MDM   Final diagnoses:  Left sided chest pain   Patient with pacemaker history and cardiac follow-up presents with atypical chest discomfort. Discussed likely musculoskeletal no sign of infection however with age and risk factors hernia screen obtained. EKG unremarkable troponin negative. Patient minimal symptoms in the ER. Discussed with cardiology for urgent outpatient follow-up, they will call the patient for an appointment next 48 hours.  Results and differential diagnosis were discussed with the patient/parent/guardian. Xrays were independently reviewed by myself.  Close follow up outpatient was discussed, comfortable with the plan.   Medications - No data to display  Filed Vitals:   04/19/15 1230 04/19/15 1245 04/19/15 1315 04/19/15 1345  BP: 158/62 159/66 164/71 160/52  Pulse: 59 60 62 58  Temp:      TempSrc:      Resp: 18 18 19 18   SpO2: 94% 93% 97% 92%    Final diagnoses:  Left sided chest pain       Elnora Morrison, MD 04/19/15 1141  Elnora Morrison, MD 04/19/15 1423

## 2015-05-11 ENCOUNTER — Ambulatory Visit (INDEPENDENT_AMBULATORY_CARE_PROVIDER_SITE_OTHER): Payer: Medicare HMO

## 2015-05-11 DIAGNOSIS — Z7901 Long term (current) use of anticoagulants: Secondary | ICD-10-CM

## 2015-05-11 DIAGNOSIS — Z5181 Encounter for therapeutic drug level monitoring: Secondary | ICD-10-CM | POA: Diagnosis not present

## 2015-05-11 DIAGNOSIS — I4891 Unspecified atrial fibrillation: Secondary | ICD-10-CM

## 2015-05-11 DIAGNOSIS — I2699 Other pulmonary embolism without acute cor pulmonale: Secondary | ICD-10-CM | POA: Diagnosis not present

## 2015-05-11 LAB — POCT INR: INR: 2.3

## 2015-06-23 ENCOUNTER — Ambulatory Visit (INDEPENDENT_AMBULATORY_CARE_PROVIDER_SITE_OTHER): Payer: Medicare HMO | Admitting: *Deleted

## 2015-06-23 DIAGNOSIS — I2699 Other pulmonary embolism without acute cor pulmonale: Secondary | ICD-10-CM | POA: Diagnosis not present

## 2015-06-23 DIAGNOSIS — Z7901 Long term (current) use of anticoagulants: Secondary | ICD-10-CM | POA: Diagnosis not present

## 2015-06-23 DIAGNOSIS — Z5181 Encounter for therapeutic drug level monitoring: Secondary | ICD-10-CM | POA: Diagnosis not present

## 2015-06-23 DIAGNOSIS — I4891 Unspecified atrial fibrillation: Secondary | ICD-10-CM

## 2015-06-23 LAB — POCT INR: INR: 2.3

## 2015-07-16 ENCOUNTER — Other Ambulatory Visit: Payer: Self-pay | Admitting: Cardiology

## 2015-08-03 ENCOUNTER — Ambulatory Visit (INDEPENDENT_AMBULATORY_CARE_PROVIDER_SITE_OTHER): Payer: Medicare HMO

## 2015-08-03 DIAGNOSIS — Z5181 Encounter for therapeutic drug level monitoring: Secondary | ICD-10-CM

## 2015-08-03 DIAGNOSIS — I2699 Other pulmonary embolism without acute cor pulmonale: Secondary | ICD-10-CM

## 2015-08-03 DIAGNOSIS — Z7901 Long term (current) use of anticoagulants: Secondary | ICD-10-CM

## 2015-08-03 DIAGNOSIS — I4891 Unspecified atrial fibrillation: Secondary | ICD-10-CM | POA: Diagnosis not present

## 2015-08-03 LAB — POCT INR: INR: 1.8

## 2015-08-14 DIAGNOSIS — N3000 Acute cystitis without hematuria: Secondary | ICD-10-CM | POA: Diagnosis not present

## 2015-08-14 DIAGNOSIS — N281 Cyst of kidney, acquired: Secondary | ICD-10-CM | POA: Diagnosis not present

## 2015-08-14 DIAGNOSIS — N3020 Other chronic cystitis without hematuria: Secondary | ICD-10-CM | POA: Diagnosis not present

## 2015-08-14 DIAGNOSIS — R3914 Feeling of incomplete bladder emptying: Secondary | ICD-10-CM | POA: Diagnosis not present

## 2015-08-28 DIAGNOSIS — E1142 Type 2 diabetes mellitus with diabetic polyneuropathy: Secondary | ICD-10-CM | POA: Diagnosis not present

## 2015-08-28 DIAGNOSIS — B351 Tinea unguium: Secondary | ICD-10-CM | POA: Diagnosis not present

## 2015-08-28 DIAGNOSIS — L97521 Non-pressure chronic ulcer of other part of left foot limited to breakdown of skin: Secondary | ICD-10-CM | POA: Diagnosis not present

## 2015-09-14 ENCOUNTER — Ambulatory Visit (INDEPENDENT_AMBULATORY_CARE_PROVIDER_SITE_OTHER): Payer: Medicare HMO | Admitting: *Deleted

## 2015-09-14 DIAGNOSIS — I2699 Other pulmonary embolism without acute cor pulmonale: Secondary | ICD-10-CM | POA: Diagnosis not present

## 2015-09-14 DIAGNOSIS — Z7901 Long term (current) use of anticoagulants: Secondary | ICD-10-CM

## 2015-09-14 DIAGNOSIS — Z5181 Encounter for therapeutic drug level monitoring: Secondary | ICD-10-CM | POA: Diagnosis not present

## 2015-09-14 DIAGNOSIS — I495 Sick sinus syndrome: Secondary | ICD-10-CM

## 2015-09-14 DIAGNOSIS — I4891 Unspecified atrial fibrillation: Secondary | ICD-10-CM | POA: Diagnosis not present

## 2015-09-14 LAB — CUP PACEART INCLINIC DEVICE CHECK
Brady Statistic AP VS Percent: 88 %
Brady Statistic AS VP Percent: 0 %
Brady Statistic AS VS Percent: 0 %
Date Time Interrogation Session: 20161215164713
Implantable Lead Implant Date: 20101001
Implantable Lead Location: 753860
Implantable Lead Model: 5076
Lead Channel Impedance Value: 418 Ohm
Lead Channel Pacing Threshold Amplitude: 2.5 V
Lead Channel Pacing Threshold Pulse Width: 0.4 ms
Lead Channel Pacing Threshold Pulse Width: 0.4 ms
Lead Channel Sensing Intrinsic Amplitude: 8 mV
Lead Channel Setting Sensing Sensitivity: 2.8 mV
MDC IDC LEAD IMPLANT DT: 20101001
MDC IDC LEAD LOCATION: 753859
MDC IDC MSMT BATTERY IMPEDANCE: 477 Ohm
MDC IDC MSMT BATTERY REMAINING LONGEVITY: 75 mo
MDC IDC MSMT BATTERY VOLTAGE: 2.79 V
MDC IDC MSMT LEADCHNL RA IMPEDANCE VALUE: 406 Ohm
MDC IDC MSMT LEADCHNL RA PACING THRESHOLD AMPLITUDE: 1 V
MDC IDC SET LEADCHNL RA PACING AMPLITUDE: 2 V
MDC IDC SET LEADCHNL RV PACING AMPLITUDE: 5 V
MDC IDC SET LEADCHNL RV PACING PULSEWIDTH: 0.4 ms
MDC IDC STAT BRADY AP VP PERCENT: 12 %

## 2015-09-14 LAB — POCT INR: INR: 2.1

## 2015-09-14 NOTE — Progress Notes (Signed)
Pacemaker check in clinic. Normal device function. Thresholds, sensing, impedances consistent with previous measurements. Device programmed to maximize longevity. No mode switch or high ventricular rates noted. Device programmed at appropriate safety margins. Histogram distribution appropriate for patient activity level. Device programmed to optimize intrinsic conduction. Estimated longevity 4.5-7.5 years. ROV with JA in June.

## 2015-09-19 ENCOUNTER — Encounter: Payer: Self-pay | Admitting: Internal Medicine

## 2015-09-24 ENCOUNTER — Other Ambulatory Visit: Payer: Self-pay | Admitting: Cardiology

## 2015-09-24 ENCOUNTER — Encounter (HOSPITAL_COMMUNITY): Payer: Self-pay | Admitting: Nurse Practitioner

## 2015-09-24 ENCOUNTER — Emergency Department (HOSPITAL_COMMUNITY)
Admission: EM | Admit: 2015-09-24 | Discharge: 2015-09-24 | Disposition: A | Discharge status: Home / Self Care | Payer: Medicare HMO | Attending: Emergency Medicine | Admitting: Emergency Medicine

## 2015-09-24 ENCOUNTER — Emergency Department (HOSPITAL_COMMUNITY): Payer: Medicare HMO

## 2015-09-24 DIAGNOSIS — Z950 Presence of cardiac pacemaker: Secondary | ICD-10-CM | POA: Diagnosis not present

## 2015-09-24 DIAGNOSIS — I10 Essential (primary) hypertension: Secondary | ICD-10-CM | POA: Insufficient documentation

## 2015-09-24 DIAGNOSIS — Z7901 Long term (current) use of anticoagulants: Secondary | ICD-10-CM | POA: Diagnosis not present

## 2015-09-24 DIAGNOSIS — Z86010 Personal history of colonic polyps: Secondary | ICD-10-CM | POA: Diagnosis not present

## 2015-09-24 DIAGNOSIS — Z87891 Personal history of nicotine dependence: Secondary | ICD-10-CM | POA: Diagnosis not present

## 2015-09-24 DIAGNOSIS — I5032 Chronic diastolic (congestive) heart failure: Secondary | ICD-10-CM | POA: Diagnosis not present

## 2015-09-24 DIAGNOSIS — R0789 Other chest pain: Secondary | ICD-10-CM | POA: Insufficient documentation

## 2015-09-24 DIAGNOSIS — E1142 Type 2 diabetes mellitus with diabetic polyneuropathy: Secondary | ICD-10-CM | POA: Diagnosis not present

## 2015-09-24 DIAGNOSIS — I4891 Unspecified atrial fibrillation: Secondary | ICD-10-CM | POA: Insufficient documentation

## 2015-09-24 DIAGNOSIS — Z8739 Personal history of other diseases of the musculoskeletal system and connective tissue: Secondary | ICD-10-CM | POA: Insufficient documentation

## 2015-09-24 DIAGNOSIS — Z7984 Long term (current) use of oral hypoglycemic drugs: Secondary | ICD-10-CM | POA: Diagnosis not present

## 2015-09-24 DIAGNOSIS — Z86718 Personal history of other venous thrombosis and embolism: Secondary | ICD-10-CM | POA: Diagnosis not present

## 2015-09-24 DIAGNOSIS — Z86711 Personal history of pulmonary embolism: Secondary | ICD-10-CM | POA: Diagnosis not present

## 2015-09-24 DIAGNOSIS — R0602 Shortness of breath: Secondary | ICD-10-CM | POA: Insufficient documentation

## 2015-09-24 DIAGNOSIS — R079 Chest pain, unspecified: Secondary | ICD-10-CM | POA: Diagnosis not present

## 2015-09-24 DIAGNOSIS — I2510 Atherosclerotic heart disease of native coronary artery without angina pectoris: Secondary | ICD-10-CM | POA: Insufficient documentation

## 2015-09-24 DIAGNOSIS — Z79899 Other long term (current) drug therapy: Secondary | ICD-10-CM | POA: Diagnosis not present

## 2015-09-24 DIAGNOSIS — Z8719 Personal history of other diseases of the digestive system: Secondary | ICD-10-CM | POA: Diagnosis not present

## 2015-09-24 DIAGNOSIS — E876 Hypokalemia: Secondary | ICD-10-CM | POA: Diagnosis not present

## 2015-09-24 DIAGNOSIS — D649 Anemia, unspecified: Secondary | ICD-10-CM | POA: Diagnosis not present

## 2015-09-24 DIAGNOSIS — Z853 Personal history of malignant neoplasm of breast: Secondary | ICD-10-CM | POA: Insufficient documentation

## 2015-09-24 LAB — CBC
HCT: 27.8 % — ABNORMAL LOW (ref 36.0–46.0)
Hemoglobin: 8.5 g/dL — ABNORMAL LOW (ref 12.0–15.0)
MCH: 26 pg (ref 26.0–34.0)
MCHC: 30.6 g/dL (ref 30.0–36.0)
MCV: 85 fL (ref 78.0–100.0)
PLATELETS: 276 10*3/uL (ref 150–400)
RBC: 3.27 MIL/uL — AB (ref 3.87–5.11)
RDW: 16.7 % — ABNORMAL HIGH (ref 11.5–15.5)
WBC: 4.3 10*3/uL (ref 4.0–10.5)

## 2015-09-24 LAB — BASIC METABOLIC PANEL
Anion gap: 9 (ref 5–15)
BUN: 22 mg/dL — ABNORMAL HIGH (ref 6–20)
CALCIUM: 9.1 mg/dL (ref 8.9–10.3)
CO2: 28 mmol/L (ref 22–32)
CREATININE: 1.67 mg/dL — AB (ref 0.44–1.00)
Chloride: 103 mmol/L (ref 101–111)
GFR, EST AFRICAN AMERICAN: 32 mL/min — AB (ref >60)
GFR, EST NON AFRICAN AMERICAN: 27 mL/min — AB (ref >60)
Glucose, Bld: 113 mg/dL — ABNORMAL HIGH (ref 65–99)
Potassium: 4.2 mmol/L (ref 3.5–5.1)
SODIUM: 140 mmol/L (ref 135–145)

## 2015-09-24 LAB — POC OCCULT BLOOD, ED: FECAL OCCULT BLD: NEGATIVE

## 2015-09-24 LAB — PROTIME-INR
INR: 2.16 — ABNORMAL HIGH (ref 0.00–1.49)
PROTHROMBIN TIME: 23.9 s — AB (ref 11.6–15.2)

## 2015-09-24 LAB — I-STAT TROPONIN, ED: TROPONIN I, POC: 0.01 ng/mL (ref 0.00–0.08)

## 2015-09-24 NOTE — ED Provider Notes (Signed)
CSN: [id number]     Arrival date & time 09/24/15  1302 History   First MD Initiated Contact with Patient 09/24/15 1312     Chief Complaint  Patient presents with  . Chest Pain  . Anxiety   HPI  [name] [name] is an 81 year old female with PMHx of HTN, DM, a fib on coumadin, sick sinus syndrome s/p pacemaker, CHF, CAD and diverticulosis presenting with chest pain and SOB. Patient reports onset of chest pain this morning while getting ready. She states that it started in her central chest and shot towards the left side. She is unable to describe the pain but states that "it was just an uncomfortable pain". She is able to describe it as severe. She states that the pain was there for approximately 20 minutes before spontaneously resolving. Associated with the chest pain was shortness of breath. She notes that she has had this chest pain and shortness of breath intermittently over the past 3 months. Her brother passed away approximately 3 months ago which is when the symptoms started. She states that whenever she thinks that her brother she begins to get the chest pain. She states that this morning she was getting ready to go to her family's Christmas get-together and began thinking about her brother not being there. She states the more she thinks that the chest pain, the more severely gets. All symptoms have completely resolved. Her daughter is at bedside and confirms that her mother has a history of chest pain and anxiety. She states that she is supposed to take her Xanax when the chest pain starts at her mother is out of Xanax currently. [name] denies all other complaints currently. Denies diaphoresis, dizziness, syncope, nausea, indigestion or vomiting with the chest pain episode. She was seen by her cardiology clinic on 12/15 for pacemaker check with normal device function.   Past Medical History  Diagnosis Date  . Hypertension   . Diabetes mellitus   . Tachy-brady syndrome ([place])     s/p pacer  07/2009  . Atrial fibrillation (HCC)     flecainide; coumadin  . Atrial flutter ([place])     November, 2010, flecainide, Coumadin  . Chronic diastolic heart failure (HCC)     echo 10/10: mild LVH, EF 55-60%, mild AI, mild MR, mild to mod LAE, mild RVE, severe RAE, mod TR, PASP 53;  Echo 3/14:  Mild LVH, EF 55%, Tr AI, MAC, mild MR, mod LAE, PASP 31, trivial eff.  . Shortness of breath     myoview 11/10: EF 65%, no ischemia  . Pulmonary HTN ([place])     53 mmHg echo, 2010, moderate TR, mild right ventricular enlargement  . DVT (deep venous thrombosis) (HCC)     Pulmonary embolus  . Pulmonary embolus [facility]) 2006    2006, with DVT  . Carotid artery disease ([place])     [id number]: RICA [id number], LICA [id number]  . Thyroid nodule     non-neoplastic goiter  . Breast cancer ([place])   . Ejection fraction     EF 60%, echo, October, 2010  . Warfarin anticoagulation     Atrial fibrillation  . Pacemaker     November, 2010, Dr Rayann Heman - MDT ADDRL1 [place] DC PPM ser #: [id number]    . S/P laminectomy     June 22, 2011  . Syncope     2010, treated with pacemaker  . Drug therapy     FLECAINIDE  . Mitral regurgitation  mild, echo, October, 2010  . Mediastinal lymphadenopathy   . Venous insufficiency     Chronic  . Hypokalemia     October, 2012, potassium dose increased  . Hiatal hernia 2006    EGD  . Acute gastric ulcer without mention of hemorrhage, perforation, or obstruction 2006    EGD  . Esophagitis, unspecified 2006    EGD  . Allergy     seasonal  . Diverticulosis   . Hx of adenomatous colonic polyps   . Fecal incontinence   . Breast cancer ([place])   . Osteopenia   . Diabetic peripheral neuropathy ([place]) 03/09/2015   Past Surgical History  Procedure Laterality Date  . Breast surgery      Left  . Abdominal hysterectomy    . Back surgery      1973, 2012   . Ankle surgery     Family History  Problem Relation Age of Onset  . Heart disease Mother   . COPD Father   . Colon cancer Neg Hx     Social History  Substance Use Topics  . Smoking status: Former Smoker -- 4.00 packs/day    Quit date: 10/26/1998  . Smokeless tobacco: Never Used  . Alcohol Use: No   OB History    No data available     Review of Systems  Constitutional: Negative for fever and chills.  HENT: Negative for congestion, rhinorrhea and sore throat.   Eyes: Negative for visual disturbance.  Respiratory: Positive for shortness of breath. Negative for cough.   Cardiovascular: Positive for chest pain. Negative for palpitations.  Gastrointestinal: Negative for nausea, vomiting, abdominal pain and diarrhea.  Genitourinary: Negative for dysuria, hematuria and flank pain.  Musculoskeletal: Negative for myalgias.  Skin: Negative for rash.  Neurological: Negative for dizziness, syncope, weakness and headaches.  All other systems reviewed and are negative.     Allergies  Morphine and Morphine and related  Home Medications   Prior to Admission medications   Medication Sig Start Date End Date Taking? Authorizing Provider  allopurinol (ZYLOPRIM) 300 MG tablet Take 150 mg by mouth daily.   Yes Historical Provider, MD  ALPRAZolam Duanne Moron) 0.25 MG tablet Take 0.25 mg by mouth daily as needed for anxiety.    Yes Historical Provider, MD  anastrozole (ARIMIDEX) 1 MG tablet Take 1 mg by mouth daily.   Yes Historical Provider, MD  bethanechol (URECHOLINE) 25 MG tablet Take 25 mg by mouth daily.    Yes Historical Provider, MD  dicyclomine (BENTYL) 10 MG capsule Take 10 mg by mouth 2 (two) times daily.   Yes Historical Provider, MD  flecainide (TAMBOCOR) 100 MG tablet Take 0.5 tablets (50 mg total) by mouth 2 (two) times daily. 03/16/15  Yes Thompson Grayer, MD  furosemide (LASIX) 40 MG tablet TAKE 1 TABLET (40 MG TOTAL) BY MOUTH 2 (TWO) TIMES DAILY. 02/28/15  Yes Carlena Bjornstad, MD  GABAPENTIN PO Take 300 mg by mouth 2 (two) times daily.    Yes Historical Provider, MD  metFORMIN (GLUCOPHAGE) 500 MG tablet Take 500 mg by  mouth 2 (two) times daily with a meal.    Yes Historical Provider, MD  metoprolol succinate (TOPROL-XL) 25 MG 24 hr tablet Take 1/2 (12.5 mg) tablet by mouth daily 02/03/15  Yes Carlena Bjornstad, MD  nitrofurantoin (MACRODANTIN) 100 MG capsule Take 100 mg by mouth daily.   Yes Historical Provider, MD  potassium chloride 20 MEQ/15ML (10%) solution Take 20 mEq by mouth daily.  Yes Historical Provider, MD  pyridoxine (B-6) 100 MG tablet Take 200 mg by mouth daily.    Yes Historical Provider, MD  vitamin B-12 (CYANOCOBALAMIN) 500 MCG tablet Take 500 mcg by mouth daily.    Yes Historical Provider, MD  warfarin (COUMADIN) 5 MG tablet Take 2.5-5 mg by mouth See admin instructions. Patient takes 2.5 mg every day except on Thursday patient takes 5 mg   Yes Historical Provider, MD  warfarin (COUMADIN) 5 MG tablet TAKE 1 TABLET (5 MG TOTAL) BY MOUTH ONE TIME ONLY AT 6 PM. 07/17/15   Carlena Bjornstad, MD   BP 135/72 mmHg  Pulse 62  Temp(Src) 98 F (36.7 C) (Oral)  Resp 16  Ht 5\' 11"  (1.803 m)  Wt 108.863 kg  BMI 33.49 kg/m2  SpO2 94% Physical Exam  Constitutional: She appears well-developed and well-nourished. No distress.  Nontoxic appearing  HENT:  Head: Normocephalic and atraumatic.  Eyes: Conjunctivae and EOM are normal. Right eye exhibits no discharge. Left eye exhibits no discharge. No scleral icterus.  Neck: Normal range of motion. Neck supple.  Cardiovascular: Normal rate, regular rhythm and normal heart sounds.   Pulmonary/Chest: Effort normal and breath sounds normal. No respiratory distress. She has no wheezes. She has no rales.    Tenderness over left anterior chest wall at pacemaker site.  Abdominal: Soft. She exhibits no distension. There is no tenderness. There is no rebound and no guarding.  Musculoskeletal: Normal range of motion. She exhibits no edema.  Moves all extremities spontaneously  Neurological: She is alert. Coordination normal.  Skin: Skin is warm and dry.  Psychiatric:  She has a normal mood and affect. Her behavior is normal.  Nursing note and vitals reviewed.   ED Course  Procedures (including critical care time) Labs Review Labs Reviewed  BASIC METABOLIC PANEL - Abnormal; Notable for the following:    Glucose, Bld 113 (*)    BUN 22 (*)    Creatinine, Ser 1.67 (*)    GFR calc non Af Amer 27 (*)    GFR calc Af Amer 32 (*)    All other components within normal limits  CBC - Abnormal; Notable for the following:    RBC 3.27 (*)    Hemoglobin 8.5 (*)    HCT 27.8 (*)    RDW 16.7 (*)    All other components within normal limits  PROTIME-INR - Abnormal; Notable for the following:    Prothrombin Time 23.9 (*)    INR 2.16 (*)    All other components within normal limits  I-STAT TROPOININ, ED  POC OCCULT BLOOD, ED    Imaging Review Dg Chest 2 View  09/24/2015  CLINICAL DATA:  Acute onset of sternal chest pain, shortness of breath starting today EXAM: CHEST  2 VIEW COMPARISON:  04/19/2015 FINDINGS: Cardiomediastinal silhouette is stable. Old fracture of the right seventh rib again noted. Dual lead cardiac pacemaker is unchanged in position. Large hiatal hernia again noted. Osteopenia and degenerative changes thoracic spine. Stable mild compression deformity lower thoracic spine. No acute infiltrate or pleural effusion. No pulmonary edema. IMPRESSION: No active cardiopulmonary disease. Stable chronic findings as described above. Two leads cardiac pacemaker is unchanged in position. Large hiatal hernia again noted. Electronically Signed   By: Lahoma Crocker M.D.   On: 09/24/2015 13:51   I have personally reviewed and evaluated these images and lab results as part of my medical decision-making.   EKG Interpretation   Date/Time:  Sunday September 24 2015  13:11:41 EST Ventricular Rate:  61 PR Interval:    QRS Duration: 120 QT Interval:  489 QTC Calculation: 493 R Axis:   -14 Text Interpretation:  Junctional rhythm Probable left ventricular  hypertrophy  Borderline prolonged QT interval Confirmed by Lacinda Axon  MD, BRIAN  [id number]) on 09/24/2015 2:17:05 PM      MDM   Final diagnoses:  Atypical chest pain  Anemia, unspecified anemia type   Patient presenting with atypical chest pain and SOB. Pt has been having multiple episodes of similar chest pain over the past 3 months since passing of her brother. She states that whenever she thinks about him she gets the chest pain. Patient is to be discharged with recommendation to follow up with  their cardiologist in regards to today's hospital visit. VSS. Heart RRR without murmur. Lungs CTAB. Troponin 0.01 with non-iscehmic ECG. CXR without abnormality. Presentation and workup is not consistent with cardiac or pulmonary pain origin. Symptoms are fully resolved on presentation to ED. Anxiety is likely contributory. Also noted to be anemic with Hgb of 8.5. Negative hemoccult. Pt is hemodynamically stable without signs of symptomatic anemia. Discussed this with patient and family at bedside who wish to have further evaluation outpatient by her PCP. Pt has been advised to return to the ED if CP becomes exertional, associated with diaphoresis or nausea, radiates to left jaw/arm, worsens or becomes concerning in any way. Pt appears reliable for follow up and is agreeable to discharge.   Case has been discussed with and seen by Dr. Lacinda Axon who agrees with the above plan to discharge.      Josephina Gip, PA-C 09/24/15 1543  Nat Christen, MD 09/24/15 [phone]

## 2015-09-24 NOTE — Discharge Instructions (Signed)
Schedule an appointment with your PCP to discuss your low hemoglobin (red blood cell count). Schedule a follow up appointment with your cardiologist to discuss today's visit.    Chest Pain Observation It is often hard to give a specific diagnosis for the cause of chest pain. Among other possibilities your symptoms might be caused by inadequate oxygen delivery to your heart (angina). Angina that is not treated or evaluated can lead to a heart attack (myocardial infarction) or death. Blood tests, electrocardiograms, and X-rays may have been done to help determine a possible cause of your chest pain. After evaluation and observation, your health care provider has determined that it is unlikely your pain was caused by an unstable condition that requires hospitalization. However, a full evaluation of your pain may need to be completed, with additional diagnostic testing as directed. It is very important to keep your follow-up appointments. Not keeping your follow-up appointments could result in permanent heart damage, disability, or death. If there is any problem keeping your follow-up appointments, you must call your health care provider. HOME CARE INSTRUCTIONS  Due to the slight chance that your pain could be angina, it is important to follow your health care provider's treatment plan and also maintain a healthy lifestyle:  Maintain or work toward achieving a healthy weight.  Stay physically active and exercise regularly.  Decrease your salt intake.  Eat a balanced, healthy diet. Talk to a dietitian to learn about heart-healthy foods.  Increase your fiber intake by including whole grains, vegetables, fruits, and nuts in your diet.  Avoid situations that cause stress, anger, or depression.  Take medicines as advised by your health care provider. Report any side effects to your health care provider. Do not stop medicines or adjust the dosages on your own.  Quit smoking. Do not use nicotine patches  or gum until you check with your health care provider.  Keep your blood pressure, blood sugar, and cholesterol levels within normal limits.  Limit alcohol intake to no more than 1 drink per day for women who are not pregnant and 2 drinks per day for men.  Do not abuse drugs. SEEK IMMEDIATE MEDICAL CARE IF: You have severe chest pain or pressure which may include symptoms such as:  You feel pain or pressure in your arms, neck, jaw, or back.  You have severe back or abdominal pain, feel sick to your stomach (nauseous), or throw up (vomit).  You are sweating profusely.  You are having a fast or irregular heartbeat.  You feel short of breath while at rest.  You notice increasing shortness of breath during rest, sleep, or with activity.  You have chest pain that does not get better after rest or after taking your usual medicine.  You wake from sleep with chest pain.  You are unable to sleep because you cannot breathe.  You develop a frequent cough or you are coughing up blood.  You feel dizzy, faint, or experience extreme fatigue.  You develop severe weakness, dizziness, fainting, or chills. Any of these symptoms may represent a serious problem that is an emergency. Do not wait to see if the symptoms will go away. Call your local emergency services (911 in the [place] Do not drive yourself to the hospital. MAKE SURE YOU:  Understand these instructions.  Will watch your condition.  Will get help right away if you are not doing well or get worse.   This information is not intended to replace advice given to you by your  health care provider. Make sure you discuss any questions you have with your health care provider.   Document Released: 10/19/2010 Document Revised: 09/21/2013 Document Reviewed: 03/18/2013 Elsevier Interactive Patient Education 2016 [employer].   Anemia, Nonspecific Anemia is a condition in which the concentration of red blood cells or hemoglobin in the  blood is below normal. Hemoglobin is a substance in red blood cells that carries oxygen to the tissues of the body. Anemia results in not enough oxygen reaching these tissues.  CAUSES  Common causes of anemia include:   Excessive bleeding. Bleeding may be internal or external. This includes excessive bleeding from periods (in women) or from the intestine.   Poor nutrition.   Chronic kidney, thyroid, and liver disease.  Bone marrow disorders that decrease red blood cell production.  Cancer and treatments for cancer.  HIV, AIDS, and their treatments.  Spleen problems that increase red blood cell destruction.  Blood disorders.  Excess destruction of red blood cells due to infection, medicines, and autoimmune disorders. SIGNS AND SYMPTOMS   Minor weakness.   Dizziness.   Headache.  Palpitations.   Shortness of breath, especially with exercise.   Paleness.  Cold sensitivity.  Indigestion.  Nausea.  Difficulty sleeping.  Difficulty concentrating. Symptoms may occur suddenly or they may develop slowly.  DIAGNOSIS  Additional blood tests are often needed. These help your health care provider determine the best treatment. Your health care provider will check your stool for blood and look for other causes of blood loss.  TREATMENT  Treatment varies depending on the cause of the anemia. Treatment can include:   Supplements of iron, vitamin 123456, or folic acid.   Hormone medicines.   A blood transfusion. This may be needed if blood loss is severe.   Hospitalization. This may be needed if there is significant continual blood loss.   Dietary changes.  Spleen removal. HOME CARE INSTRUCTIONS Keep all follow-up appointments. It often takes many weeks to correct anemia, and having your health care provider check on your condition and your response to treatment is very important. SEEK IMMEDIATE MEDICAL CARE IF:   You develop extreme weakness, shortness of  breath, or chest pain.   You become dizzy or have trouble concentrating.  You develop heavy vaginal bleeding.   You develop a rash.   You have bloody or black, tarry stools.   You faint.   You vomit up blood.   You vomit repeatedly.   You have abdominal pain.  You have a fever or persistent symptoms for more than 2-3 days.   You have a fever and your symptoms suddenly get worse.   You are dehydrated.  MAKE SURE YOU:  Understand these instructions.  Will watch your condition.  Will get help right away if you are not doing well or get worse.   This information is not intended to replace advice given to you by your health care provider. Make sure you discuss any questions you have with your health care provider.   Document Released: 10/24/2004 Document Revised: 05/19/2013 Document Reviewed: 03/12/2013 Elsevier Interactive Patient Education [employer].

## 2015-09-24 NOTE — ED Notes (Signed)
Pt brought in by EMS for centralized chest pain that has been recurrent x3 months.  Pt reports that she lost her brother three months ago as well and was on her way to a family function when she began thinking about her brother and the chest pain/SOB began.

## 2015-09-26 NOTE — Progress Notes (Signed)
Cardiology Office Note    Date:  09/27/2015   ID:  [name] [name], DOB 07-03-1931, MRN [id number]  PCP:  Foye Spurling, MD  Cardiologist:  Dr. Cleatis Polka >> Dr. Ena Dawley  (d/w Dr. Meda Coffee today) Electrophysiologist:  Dr. Thompson Grayer   Chief Complaint  Patient presents with  . Hospitalization Follow-up    ED visit with chest pain    History of Present Illness:  [name] [name] is a 81 y.o. female with a hx of PAF, tachybradycardia syndrome status post PPM, diastolic HF, carotid stenosis, prior DVT/pulmonary embolism, HTN, DM2, breast CA.  Patient has done well on low-dose flecainide. Dr. Ron Parker has recommended that she be on flecainide 50 mg twice a day in the past. She has been on Coumadin for anticoagulation. Last seen by Dr. Ron Parker 4/16.  Last seen by Dr. Rayann Heman 6/16. At that time, she was on flecainide 100 mg twice a day. He reduced her flecainide back to 50 mg twice a day.  She was seen in the emergency room 09/24/15 with chest pain. According to the records, her chest pain had been evident since her brother passed away 3 months ago.  Point-of-care cardiac markers were negative. ECG was unremarkable. Of note, hemoglobin was noted to be markedly depressed. Hgb in 7/16 was 11.3 >> Hgb 09/24/15 was 8.5.  Hemoccult was neg in the ED.   Here today with her friend and her daughter. Over the past month, she has noticed gradually worsening shortness of breath. She's also noticed occasional chest pain. This mainly occurs with anxiety. It is sharp and substernal. She sleeps on 2 pillows. She has noticed increasing LE edema. She notes questionable symptoms of PND. She denies syncope. She does admit to increased salt in her diet. She denies any coughing or wheezing. She has had frequent diarrhea recently. She also notes black stools. She denies any bright or blood per rectum. Of note, she has tried to schedule follow-up with primary care. She has not been able to get an appointment yet.       Past Medical History  Diagnosis Date  . Hypertension   . Diabetes mellitus   . Tachy-brady syndrome ([place])     s/p pacer 07/2009  . Atrial fibrillation (HCC)     flecainide; coumadin  . Atrial flutter ([place])     November, 2010, flecainide, Coumadin  . Chronic diastolic heart failure (HCC)     echo 10/10: mild LVH, EF 55-60%, mild AI, mild MR, mild to mod LAE, mild RVE, severe RAE, mod TR, PASP 53;  Echo 3/14:  Mild LVH, EF 55%, Tr AI, MAC, mild MR, mod LAE, PASP 31, trivial eff.  . Shortness of breath     myoview 11/10: EF 65%, no ischemia  . Pulmonary HTN ([place])     53 mmHg echo, 2010, moderate TR, mild right ventricular enlargement  . DVT (deep venous thrombosis) (HCC)     Pulmonary embolus  . Pulmonary embolus [facility]) 2006    2006, with DVT  . Carotid artery disease ([place])     [id number]: RICA [id number], LICA 123456  . Thyroid nodule     non-neoplastic goiter  . Breast cancer ([place])   . Ejection fraction     EF 60%, echo, October, 2010  . Warfarin anticoagulation     Atrial fibrillation  . Pacemaker     November, 2010, Dr Rayann Heman - MDT ADDRL1 Adapta DC PPM ser #: [id number]    . S/P laminectomy  June 22, 2011  . Syncope     2010, treated with pacemaker  . Drug therapy     FLECAINIDE  . Mitral regurgitation     mild, echo, October, 2010  . Mediastinal lymphadenopathy   . Venous insufficiency     Chronic  . Hypokalemia     October, 2012, potassium dose increased  . Hiatal hernia 2006    EGD  . Acute gastric ulcer without mention of hemorrhage, perforation, or obstruction 2006    EGD  . Esophagitis, unspecified 2006    EGD  . Allergy     seasonal  . Diverticulosis   . Hx of adenomatous colonic polyps   . Fecal incontinence   . Breast cancer ([place])   . Osteopenia   . Diabetic peripheral neuropathy ([place]) 03/09/2015    Past Surgical History  Procedure Laterality Date  . Breast surgery      Left  . Abdominal hysterectomy    . Back surgery      1973, 2012   .  Ankle surgery      Current Outpatient Prescriptions  Medication Sig Dispense Refill  . allopurinol (ZYLOPRIM) 300 MG tablet Take 150 mg by mouth daily.    Marland Kitchen ALPRAZolam (XANAX) 0.25 MG tablet Take 0.25 mg by mouth daily as needed for anxiety.     Marland Kitchen anastrozole (ARIMIDEX) 1 MG tablet Take 1 mg by mouth daily.    . bethanechol (URECHOLINE) 25 MG tablet Take 25 mg by mouth daily.     . flecainide (TAMBOCOR) 100 MG tablet Take 0.5 tablets (50 mg total) by mouth 2 (two) times daily. 60 tablet 5  . furosemide (LASIX) 80 MG tablet Take 1 tablet (80 mg total) by mouth 2 (two) times daily. 180 tablet 3  . GABAPENTIN PO Take 300 mg by mouth 2 (two) times daily.     . metFORMIN (GLUCOPHAGE) 500 MG tablet Take 500 mg by mouth 2 (two) times daily with a meal.     . metoprolol succinate (TOPROL-XL) 25 MG 24 hr tablet Take 1/2 (12.5 mg) tablet by mouth daily 45 tablet 3  . nitrofurantoin (MACRODANTIN) 100 MG capsule Take 100 mg by mouth daily.    . potassium chloride 20 MEQ/15ML (10%) solution Take 20 mEq by mouth daily.     Marland Kitchen pyridoxine (B-6) 100 MG tablet Take 200 mg by mouth daily.     . vitamin B-12 (CYANOCOBALAMIN) 500 MCG tablet Take 500 mcg by mouth daily.     Marland Kitchen warfarin (COUMADIN) 5 MG tablet TAKE 1 TABLET (5 MG TOTAL) BY MOUTH ONE TIME ONLY AT 6 PM. 30 tablet 3   Current Facility-Administered Medications  Medication Dose Route Frequency Provider Last Rate Last Dose  . promethazine (PHENERGAN) tablet 12.5 mg  12.5 mg Oral Q8H PRN Carlena Bjornstad, MD        Allergies:   Morphine and Morphine and related   Social History   Social History  . Marital Status: Widowed    Spouse Name: N/A  . Number of Children: 3  . Years of Education: N/A   Occupational History  . Retired     Social History Main Topics  . Smoking status: Former Smoker -- 4.00 packs/day    Quit date: 10/26/1998  . Smokeless tobacco: Never Used  . Alcohol Use: No  . Drug Use: No  . Sexual Activity: Not Asked   Other  Topics Concern  . None   Social History Narrative   Widowed.  Lives in [place] by herself.  Avoids caffeine.  Very active, taking care of her brother who also lives by himself and is in his late 23's.     Family History:  The patient's family history includes COPD in her father; Heart disease in her mother. There is no history of Colon cancer.   ROS:   Please see the history of present illness.    Review of Systems  Cardiovascular: Positive for dyspnea on exertion and leg swelling.  Respiratory: Positive for shortness of breath.   All other systems reviewed and are negative.   PHYSICAL EXAM:   VS:  BP 120/60 mmHg  Pulse 60  Ht 6' (1.829 m)  Wt 241 lb 12.8 oz (109.68 kg)  BMI 32.79 kg/m2  SpO2 94%   GEN: Well nourished, well developed, in no acute distress HEENT: normal Neck: + JVD at 90 degrees, no masses Cardiac: Normal 99991111, RRR; 2/6 systolic murmur LSB, no rubs, or gallops, 1-2 + bilateral LE edema Respiratory:  clear to auscultation bilaterally; no wheezing, rhonchi or rales GI: soft, nontender  MS: no deformity or atrophy Skin: warm and dry, no rash Neuro:   no focal deficits  Psych: Alert and oriented x 3, normal affect  Wt Readings from Last 3 Encounters:  09/27/15 241 lb 12.8 oz (109.68 kg)  09/24/15 240 lb (108.863 kg)  03/16/15 230 lb 6.4 oz (104.509 kg)      Studies/Labs Reviewed:   EKG:  EKG is not ordered today.   The ekg done 09/24/15 in the ED was reviewed by me and demonstrated NSR, HR 61, LAD, 1st degree AVB (PR 360 ms), QTc 493 ms, no significant change from prior ECGs  Recent Labs: 10/21/2014: ALT 15 09/24/2015: BUN 22*; Creatinine, Ser 1.67*; Hemoglobin 8.5*; Platelets 276; Potassium 4.2; Sodium 140   Recent Lipid Panel No results found for: CHOL, TRIG, HDL, CHOLHDL, VLDL, LDLCALC, LDLDIRECT  Additional studies/ records that were reviewed today include:   Carotid US [id number] RICA [id number]; LICA [id number] >> FU 1 year  Echo 3/14 Mild LVH, EF 55%,  trivial AI, MAC, mild MR, moderate LAE, PASP 31 mmHg, trivial pericardial effusion  Myoview 2/12 EF 69%, no scar or ischemia   ASSESSMENT:    1. Acute on chronic diastolic CHF (congestive heart failure) ([place])   2. Normocytic anemia   3. Other chest pain   4. Paroxysmal atrial fibrillation (HCC)   5. Bilateral carotid artery disease ([place])   6. Pacemaker-Medtronic   7. Essential hypertension   8. Murmur     PLAN:  In order of problems listed above:  1. A/C Diastolic CHF - She is volume overloaded. She is up 11 pounds since June. I question whether her anemia is driving her CHF. She also admits to a diet high in salt.  -  Maintain low-salt diet  -  Increase Lasix to 80 mg twice a day    -  BMET, BNP today.  Repeat BMET 1 week  -  Arrange FU echo.  -  Close FU with me in 1 week.   2. Anemia - She notes black stools.  But, her hemoccult was neg at the ED.  Hgb has dropped significantly since 7/16.  She needs early FU with PCP.  She will continue to try to schedule.    -  Get repeat CBC today.  -  Obtain Iron, TIBC, Ferritin, B12, Folate with labs today  -  If Hgb is lower than 12/25, she will likely  need to go to the ED for further evaluation tonight.  3. Chest Pain - Somewhat atypical. This may be related to her anemia as well. Obtain echo as noted.  Consider stress testing at some point.  Anemia needs to be addressed first.  4. PAF - Maintaining NSR. She continues on Coumadin.  If Hgb is lower, she will likely need to hold Coumadin.  Continue Flecainide at 50 bid dose.  5. Carotid stenosis - She is due for FU dopplers. Will arrange.   6. Pacemaker - FU with EP as planned.   7. HTN - BP controlled.   8. Murmur - Suspect flow murmur from anemia. Repeat Echo as noted.     Medication Adjustments/Labs and Tests Ordered: Current medicines are reviewed at length with the patient today.  Concerns regarding medicines are outlined above.  Medication changes, Labs and Tests ordered  today are listed below. Patient Instructions  Medication Instructions:  INCREASE LASIX 80 MG TWICE DAILY  Labwork: 1. TODAY BMET, CBC W/DIFF, BNP, FERRITIN, TIBC, B12, FOLATE, IRON  2. BMET TO BE DONE IN 1 WEEK  Testing/Procedures: 1. Your physician has requested that you have an echocardiogram. Echocardiography is a painless test that uses sound waves to create images of your heart. It provides your doctor with information about the size and shape of your heart and how well your heart's chambers and valves are working. This procedure takes approximately one hour. There are no restrictions for this procedure.  2. Your physician has requested that you have a carotid duplex. This test is an ultrasound of the carotid arteries in your neck. It looks at blood flow through these arteries that supply the brain with blood. Allow one hour for this exam. There are no restrictions or special instructions.   Follow-Up: 1 WEEK  WITH Mililani Murthy, [facility]   Any Other Special Instructions Will Be Listed Below (If Applicable).   If you need a refill on your cardiac medications before your next appointment, please call your pharmacy.    Signed, Richardson Dopp, PA-C  09/27/2015 2:48 PM    [facility] [place], [place],   [address] Phone: [phone]; Fax: [phone]

## 2015-09-27 ENCOUNTER — Encounter: Payer: Self-pay | Admitting: Physician Assistant

## 2015-09-27 ENCOUNTER — Ambulatory Visit (INDEPENDENT_AMBULATORY_CARE_PROVIDER_SITE_OTHER): Payer: Medicare HMO | Admitting: Physician Assistant

## 2015-09-27 VITALS — BP 120/60 | HR 60 | Ht 72.0 in | Wt 241.8 lb

## 2015-09-27 DIAGNOSIS — I480 Paroxysmal atrial fibrillation: Secondary | ICD-10-CM

## 2015-09-27 DIAGNOSIS — D649 Anemia, unspecified: Secondary | ICD-10-CM

## 2015-09-27 DIAGNOSIS — R0789 Other chest pain: Secondary | ICD-10-CM | POA: Diagnosis not present

## 2015-09-27 DIAGNOSIS — I739 Peripheral vascular disease, unspecified: Secondary | ICD-10-CM

## 2015-09-27 DIAGNOSIS — I779 Disorder of arteries and arterioles, unspecified: Secondary | ICD-10-CM

## 2015-09-27 DIAGNOSIS — R011 Cardiac murmur, unspecified: Secondary | ICD-10-CM

## 2015-09-27 DIAGNOSIS — I5033 Acute on chronic diastolic (congestive) heart failure: Secondary | ICD-10-CM

## 2015-09-27 DIAGNOSIS — I10 Essential (primary) hypertension: Secondary | ICD-10-CM

## 2015-09-27 DIAGNOSIS — Z950 Presence of cardiac pacemaker: Secondary | ICD-10-CM

## 2015-09-27 DIAGNOSIS — I509 Heart failure, unspecified: Secondary | ICD-10-CM | POA: Diagnosis not present

## 2015-09-27 LAB — CBC WITH DIFFERENTIAL/PLATELET
Basophils Absolute: 0 10*3/uL (ref 0.0–0.1)
Basophils Relative: 1 % (ref 0–1)
Eosinophils Absolute: 0.1 10*3/uL (ref 0.0–0.7)
Eosinophils Relative: 2 % (ref 0–5)
HEMATOCRIT: 27.9 % — AB (ref 36.0–46.0)
Hemoglobin: 9 g/dL — ABNORMAL LOW (ref 12.0–15.0)
LYMPHS ABS: 0.9 10*3/uL (ref 0.7–4.0)
Lymphocytes Relative: 19 % (ref 12–46)
MCH: 25.9 pg — AB (ref 26.0–34.0)
MCHC: 32.3 g/dL (ref 30.0–36.0)
MCV: 80.4 fL (ref 78.0–100.0)
MONO ABS: 0.7 10*3/uL (ref 0.1–1.0)
MONOS PCT: 15 % — AB (ref 3–12)
MPV: 8.3 fL — ABNORMAL LOW (ref 8.6–12.4)
NEUTROS ABS: 3 10*3/uL (ref 1.7–7.7)
NEUTROS PCT: 63 % (ref 43–77)
PLATELETS: 349 10*3/uL (ref 150–400)
RBC: 3.47 MIL/uL — ABNORMAL LOW (ref 3.87–5.11)
RDW: 17.5 % — AB (ref 11.5–15.5)
WBC: 4.8 10*3/uL (ref 4.0–10.5)

## 2015-09-27 LAB — IRON AND TIBC
%SAT: 5 % — AB (ref 11–50)
IRON: 21 ug/dL — AB (ref 45–160)
TIBC: 450 ug/dL (ref 250–450)
UIBC: 429 ug/dL — ABNORMAL HIGH (ref 125–400)

## 2015-09-27 LAB — BASIC METABOLIC PANEL
BUN: 22 mg/dL (ref 7–25)
CHLORIDE: 101 mmol/L (ref 98–110)
CO2: 24 mmol/L (ref 20–31)
CREATININE: 1.63 mg/dL — AB (ref 0.60–0.88)
Calcium: 8.7 mg/dL (ref 8.6–10.4)
GLUCOSE: 83 mg/dL (ref 65–99)
Potassium: 4.6 mmol/L (ref 3.5–5.3)
SODIUM: 138 mmol/L (ref 135–146)

## 2015-09-27 LAB — FERRITIN: Ferritin: 11 ng/mL (ref 10–291)

## 2015-09-27 LAB — FOLATE

## 2015-09-27 LAB — VITAMIN B12: VITAMIN B 12: 796 pg/mL (ref 211–911)

## 2015-09-27 MED ORDER — FUROSEMIDE 80 MG PO TABS: 80.0000 mg | ORAL_TABLET | Freq: Two times a day (BID) | ORAL | Status: DC

## 2015-09-27 NOTE — Patient Instructions (Addendum)
Medication Instructions:  INCREASE LASIX 80 MG TWICE DAILY  Labwork: 1. TODAY BMET, CBC W/DIFF, BNP, FERRITIN, TIBC, B12, FOLATE, IRON  2. BMET TO BE DONE IN 1 WEEK  Testing/Procedures: 1. Your physician has requested that you have an echocardiogram. Echocardiography is a painless test that uses sound waves to create images of your heart. It provides your doctor with information about the size and shape of your heart and how well your heart's chambers and valves are working. This procedure takes approximately one hour. There are no restrictions for this procedure.  2. Your physician has requested that you have a carotid duplex. This test is an ultrasound of the carotid arteries in your neck. It looks at blood flow through these arteries that supply the brain with blood. Allow one hour for this exam. There are no restrictions or special instructions.   Follow-Up: 1 WEEK  WITH SCOTT WEAVER, [facility]   Any Other Special Instructions Will Be Listed Below (If Applicable).   If you need a refill on your cardiac medications before your next appointment, please call your pharmacy.

## 2015-09-28 ENCOUNTER — Telehealth: Payer: Self-pay | Admitting: Physician Assistant

## 2015-09-28 LAB — BRAIN NATRIURETIC PEPTIDE: Brain Natriuretic Peptide: 607.5 pg/mL — ABNORMAL HIGH (ref 0.0–100.0)

## 2015-09-28 MED ORDER — IRON 325 (65 FE) MG PO TABS: 325.0000 mg | ORAL_TABLET | Freq: Two times a day (BID) | ORAL | Status: DC

## 2015-09-28 NOTE — Telephone Encounter (Signed)
New Message  Pt dtr calling to speak w/ Rn concerning lab work from yesterday- 12/28. Please call back and discuss.

## 2015-09-28 NOTE — Telephone Encounter (Signed)
Pt had give permission to s/w daughter Ruby. Ruby notified of all lab results today that were reviewed by Brynda Rim. PA and Dr. Meda Coffee who will be pt's new Cardiologist, (previous Ron Parker pt). BNP reviewed by Dr. Meda Coffee who stated to continue w/plan outlined at [place] yesterday as per Richardson Dopp, [facility] 09/27/15. Daughter advised per Brynda Rim. PA to f/u PCP , daughter states they have an appt already next week with PCP. Pt already scheduled for lab work here in the office next week as well. Ruby (daughter) said thank you for our help. I will send in an Rx for FE 325 mg BID per Brynda Rim. PA recommendations.

## 2015-10-02 DIAGNOSIS — E119 Type 2 diabetes mellitus without complications: Secondary | ICD-10-CM | POA: Diagnosis not present

## 2015-10-02 DIAGNOSIS — I10 Essential (primary) hypertension: Secondary | ICD-10-CM | POA: Diagnosis not present

## 2015-10-02 DIAGNOSIS — N183 Chronic kidney disease, stage 3 (moderate): Secondary | ICD-10-CM | POA: Diagnosis not present

## 2015-10-02 DIAGNOSIS — M150 Primary generalized (osteo)arthritis: Secondary | ICD-10-CM | POA: Diagnosis not present

## 2015-10-03 ENCOUNTER — Ambulatory Visit (HOSPITAL_COMMUNITY)
Admission: RE | Admit: 2015-10-03 | Discharge: 2015-10-03 | Disposition: A | Discharge status: Home / Self Care | Payer: Medicare HMO | Source: Ambulatory Visit | Attending: Cardiovascular Disease | Admitting: Cardiovascular Disease

## 2015-10-03 ENCOUNTER — Ambulatory Visit (HOSPITAL_BASED_OUTPATIENT_CLINIC_OR_DEPARTMENT_OTHER)
Admission: RE | Admit: 2015-10-03 | Discharge: 2015-10-03 | Disposition: A | Discharge status: Home / Self Care | Payer: Medicare HMO | Source: Ambulatory Visit | Attending: Physician Assistant | Admitting: Physician Assistant

## 2015-10-03 DIAGNOSIS — I6523 Occlusion and stenosis of bilateral carotid arteries: Secondary | ICD-10-CM | POA: Insufficient documentation

## 2015-10-03 DIAGNOSIS — I351 Nonrheumatic aortic (valve) insufficiency: Secondary | ICD-10-CM | POA: Diagnosis not present

## 2015-10-03 DIAGNOSIS — I071 Rheumatic tricuspid insufficiency: Secondary | ICD-10-CM | POA: Diagnosis not present

## 2015-10-03 DIAGNOSIS — Z87891 Personal history of nicotine dependence: Secondary | ICD-10-CM | POA: Insufficient documentation

## 2015-10-03 DIAGNOSIS — I340 Nonrheumatic mitral (valve) insufficiency: Secondary | ICD-10-CM | POA: Insufficient documentation

## 2015-10-03 DIAGNOSIS — I10 Essential (primary) hypertension: Secondary | ICD-10-CM

## 2015-10-03 DIAGNOSIS — I779 Disorder of arteries and arterioles, unspecified: Secondary | ICD-10-CM | POA: Insufficient documentation

## 2015-10-03 DIAGNOSIS — I059 Rheumatic mitral valve disease, unspecified: Secondary | ICD-10-CM | POA: Diagnosis not present

## 2015-10-03 DIAGNOSIS — I509 Heart failure, unspecified: Secondary | ICD-10-CM | POA: Insufficient documentation

## 2015-10-03 DIAGNOSIS — R011 Cardiac murmur, unspecified: Secondary | ICD-10-CM | POA: Insufficient documentation

## 2015-10-03 DIAGNOSIS — D649 Anemia, unspecified: Secondary | ICD-10-CM

## 2015-10-03 DIAGNOSIS — E119 Type 2 diabetes mellitus without complications: Secondary | ICD-10-CM | POA: Diagnosis not present

## 2015-10-03 DIAGNOSIS — I517 Cardiomegaly: Secondary | ICD-10-CM | POA: Insufficient documentation

## 2015-10-03 DIAGNOSIS — I739 Peripheral vascular disease, unspecified: Secondary | ICD-10-CM

## 2015-10-03 NOTE — Progress Notes (Signed)
Cardiology Office Note    Date:  10/04/2015   ID:  Cathryn Pettis, DOB May 27, 1931, MRN [id number]  PCP:  Foye Spurling, MD  Cardiologist:  Dr. Cleatis Polka >> Dr. Ena Dawley  (d/w Dr. Meda Coffee today) Electrophysiologist:  Dr. Thompson Grayer   Chief Complaint  Patient presents with  . Follow-up  . Congestive Heart Failure    History of Present Illness:  [name] [name] is a 82 y.o. female with a hx of PAF, tachybradycardia syndrome status post PPM, diastolic HF, carotid stenosis, prior DVT/pulmonary embolism, HTN, DM2, breast CA.  Patient has done well on low-dose flecainide. Dr. Ron Parker has recommended that she be on flecainide 50 mg twice a day in the past. She has been on Coumadin for anticoagulation. Last seen by Dr. Ron Parker 4/16.  Last seen by Dr. Rayann Heman 6/16. At that time, she was on flecainide 100 mg twice a day. He reduced her flecainide back to 50 mg twice a day.  She was seen in the emergency room 09/24/15 with chest pain. According to the records, her chest pain had been evident since her brother passed away 3 months ago.  Point-of-care cardiac markers were negative. ECG was unremarkable. Of note, hemoglobin was noted to be markedly depressed. Hgb in 7/16 was 11.3 >> Hgb 09/24/15 was 8.5.  Hemoccult was neg in the ED. I saw her in follow-up 12/28. She was volume overloaded and I increased her Lasix. Follow-up hemoglobin was stable. Iron studies demonstrated low iron and I placed her on ferrous sulfate. She has since followed up with primary care. Follow-up echocardiogram demonstrated normal LV function.  She returns for follow-up. She is feeling better. She notes improved breathing. LE edema is improved. She denies chest pain. She sleeps on 2 pillows chronically. Denies PND, syncope.   Past Medical History  Diagnosis Date  . Hypertension   . Diabetes mellitus   . Tachy-brady syndrome ([place])     s/p pacer 07/2009  . Atrial fibrillation (HCC)     flecainide; coumadin  .  Atrial flutter ([place])     November, 2010, flecainide, Coumadin  . Chronic diastolic heart failure (HCC)     echo 10/10: mild LVH, EF 55-60%, mild AI, mild MR, mild to mod LAE, mild RVE, severe RAE, mod TR, PASP 53;  Echo 3/14:  Mild LVH, EF 55%, Tr AI, MAC, mild MR, mod LAE, PASP 31, trivial eff.// echo 1/17: EF 55-60%, mild AI, MAC, mild MR, moderate BAE, moderate TR  . Shortness of breath     myoview 11/10: EF 65%, no ischemia  . Pulmonary HTN ([place])     53 mmHg echo, 2010, moderate TR, mild right ventricular enlargement  . DVT (deep venous thrombosis) (HCC)     Pulmonary embolus  . Pulmonary embolus [facility]) 2006    2006, with DVT  . Carotid artery disease ([place])     [id number]: RICA [id number], LICA 123456  . Thyroid nodule     non-neoplastic goiter  . Breast cancer ([place])   . Ejection fraction     EF 60%, echo, October, 2010  . Warfarin anticoagulation     Atrial fibrillation  . Pacemaker     November, 2010, Dr Rayann Heman - MDT ADDRL1 Adapta DC PPM ser #: [id number]    . S/P laminectomy     June 22, 2011  . Syncope     2010, treated with pacemaker  . Drug therapy     FLECAINIDE  . Mitral regurgitation     mild,  echo, October, 2010  . Mediastinal lymphadenopathy   . Venous insufficiency     Chronic  . Hypokalemia     October, 2012, potassium dose increased  . Hiatal hernia 2006    EGD  . Acute gastric ulcer without mention of hemorrhage, perforation, or obstruction 2006    EGD  . Esophagitis, unspecified 2006    EGD  . Allergy     seasonal  . Diverticulosis   . Hx of adenomatous colonic polyps   . Fecal incontinence   . Breast cancer ([place])   . Osteopenia   . Diabetic peripheral neuropathy ([place]) 03/09/2015    Past Surgical History  Procedure Laterality Date  . Breast surgery      Left  . Abdominal hysterectomy    . Back surgery      1973, 2012   . Ankle surgery      Current Outpatient Prescriptions  Medication Sig Dispense Refill  . allopurinol (ZYLOPRIM) 300 MG  tablet Take 150 mg by mouth daily.    Marland Kitchen ALPRAZolam (XANAX) 0.25 MG tablet Take 0.25 mg by mouth daily as needed for anxiety.     Marland Kitchen anastrozole (ARIMIDEX) 1 MG tablet Take 1 mg by mouth daily.    . bethanechol (URECHOLINE) 25 MG tablet Take 25 mg by mouth daily.     . Ferrous Sulfate (IRON) 325 (65 Fe) MG TABS Take 325 mg by mouth 2 (two) times daily. 60 each 11  . flecainide (TAMBOCOR) 100 MG tablet Take 0.5 tablets (50 mg total) by mouth 2 (two) times daily. 60 tablet 5  . furosemide (LASIX) 80 MG tablet Take 1 tablet (80 mg total) by mouth 2 (two) times daily. 180 tablet 3  . GABAPENTIN PO Take 300 mg by mouth 2 (two) times daily.     . metFORMIN (GLUCOPHAGE) 500 MG tablet Take 500 mg by mouth daily.     . metoprolol succinate (TOPROL-XL) 25 MG 24 hr tablet Take 1/2 (12.5 mg) tablet by mouth daily 45 tablet 3  . nitrofurantoin (MACRODANTIN) 100 MG capsule Take 100 mg by mouth daily.    . potassium chloride 20 MEQ/15ML (10%) solution Take 20 mEq by mouth daily.     Marland Kitchen pyridoxine (B-6) 200 MG tablet Take 200 mg by mouth daily.    . vitamin B-12 (CYANOCOBALAMIN) 500 MCG tablet Take 500 mcg by mouth daily.     Marland Kitchen warfarin (COUMADIN) 5 MG tablet TAKE 1 TABLET (5 MG TOTAL) BY MOUTH ONE TIME ONLY AT 6 PM. 30 tablet 3   Current Facility-Administered Medications  Medication Dose Route Frequency Provider Last Rate Last Dose  . promethazine (PHENERGAN) tablet 12.5 mg  12.5 mg Oral Q8H PRN Carlena Bjornstad, MD        Allergies:   Morphine and Morphine and related   Social History   Social History  . Marital Status: Widowed    Spouse Name: N/A  . Number of Children: 3  . Years of Education: N/A   Occupational History  . Retired     Social History Main Topics  . Smoking status: Former Smoker -- 4.00 packs/day    Quit date: 10/26/1998  . Smokeless tobacco: Never Used  . Alcohol Use: No  . Drug Use: No  . Sexual Activity: Not Asked   Other Topics Concern  . None   Social History Narrative     Widowed.  Lives in [place] by herself.  Avoids caffeine.  Very active, taking care of her brother  who also lives by himself and is in his late 96's.     Family History:  The patient's family history includes COPD in her father; Heart disease in her mother. There is no history of Colon cancer.   ROS:   Please see the history of present illness.    Review of Systems  Cardiovascular: Positive for dyspnea on exertion and leg swelling.  Respiratory: Positive for shortness of breath.   Gastrointestinal: Positive for diarrhea and nausea.  Neurological: Positive for loss of balance.  Psychiatric/Behavioral: The patient is nervous/anxious.   All other systems reviewed and are negative.   PHYSICAL EXAM:   VS:  BP 122/50 mmHg  Pulse 68  Ht 6' (1.829 m)  Wt 237 lb 12.8 oz (107.865 kg)  BMI 32.24 kg/m2   GEN: Well nourished, well developed, in no acute distress HEENT: normal Neck: no JVD, no masses Cardiac: Normal 99991111, RRR; 1/6 systolic murmur LSB, no rubs, or gallops, trace bilateral LE edema Respiratory:  clear to auscultation bilaterally; no wheezing, rhonchi or rales GI: soft, nontender  MS: no deformity or atrophy Skin: warm and dry, no rash Neuro:   no focal deficits  Psych: Alert and oriented x 3, normal affect  Wt Readings from Last 3 Encounters:  10/04/15 237 lb 12.8 oz (107.865 kg)  09/27/15 241 lb 12.8 oz (109.68 kg)  09/24/15 240 lb (108.863 kg)      Studies/Labs Reviewed:   EKG:  EKG is not ordered today.   The ekg done 09/24/15 in the ED was reviewed by me and demonstrated NSR, HR 61, LAD, 1st degree AVB (PR 360 ms), QTc 493 ms, no significant change from prior ECGs  Recent Labs: 10/21/2014: ALT 15 09/27/2015: BUN 22; Creat 1.63*; Hemoglobin 9.0*; Platelets 349; Potassium 4.6; Sodium 138   Recent Lipid Panel No results found for: CHOL, TRIG, HDL, CHOLHDL, VLDL, LDLCALC, LDLDIRECT  Additional studies/ records that were reviewed today include:   Echo 10/03/15 EF  55-60%, mild AI, MAC, mild MR, moderate BAE, moderate TR  Carotid US [id number] RICA [id number]; LICA [id number] >> FU 1 year  Echo 3/14 Mild LVH, EF 55%, trivial AI, MAC, mild MR, moderate LAE, PASP 31 mmHg, trivial pericardial effusion  Myoview 2/12 EF 69%, no scar or ischemia   ASSESSMENT:    1. Chronic diastolic CHF (congestive heart failure) ([place])   2. Normocytic anemia   3. Paroxysmal atrial fibrillation (HCC)   4. Bilateral carotid artery disease ([place])   5. Essential hypertension     PLAN:  In order of problems listed above:  1. Chronic Diastolic CHF - Volume is improved.  She is feeling better.  Her exam is improved.  Continue current dose of Lasix.  Repeat BMET today.   2. Anemia - Hgb stable.  Iron studies were low and she is now on Iron. Also likely has some anemia of chronic disease.  She has FU with her PCP.  She establishes with a new PCP later this month. Further workup for her anemia will be per primary care.    3. PAF - Maintaining NSR. She continues on Coumadin.  Continue Flecainide at 50 bid dose.  4. Carotid stenosis - Recent carotid US with stable bilateral stenosis.  Repeat 1 year.    5. HTN - BP controlled.     Medication Adjustments/Labs and Tests Ordered: Current medicines are reviewed at length with the patient today.  Concerns regarding medicines are outlined above.  Medication changes, Labs and Tests ordered today  are listed below. Patient Instructions  Medication Instructions:  No changes. Continue your current medications as listed.  Labwork: BMET today.  Testing/Procedures: None   Follow-Up: Dr. Ena Dawley 8 weeks.   Any Other Special Instructions Will Be Listed Below (If Applicable).      Signed, Richardson Dopp, PA-C  10/04/2015 4:04 PM    [facility] [place], [place], [place]  [address] Phone: [phone]; Fax: [phone]

## 2015-10-04 ENCOUNTER — Telehealth: Payer: Self-pay | Admitting: *Deleted

## 2015-10-04 ENCOUNTER — Other Ambulatory Visit (INDEPENDENT_AMBULATORY_CARE_PROVIDER_SITE_OTHER): Payer: Medicare HMO | Admitting: *Deleted

## 2015-10-04 ENCOUNTER — Ambulatory Visit (INDEPENDENT_AMBULATORY_CARE_PROVIDER_SITE_OTHER): Payer: Medicare HMO | Admitting: Physician Assistant

## 2015-10-04 ENCOUNTER — Encounter: Payer: Self-pay | Admitting: Physician Assistant

## 2015-10-04 VITALS — BP 122/50 | HR 68 | Ht 72.0 in | Wt 237.8 lb

## 2015-10-04 DIAGNOSIS — I480 Paroxysmal atrial fibrillation: Secondary | ICD-10-CM

## 2015-10-04 DIAGNOSIS — I6523 Occlusion and stenosis of bilateral carotid arteries: Secondary | ICD-10-CM

## 2015-10-04 DIAGNOSIS — D649 Anemia, unspecified: Secondary | ICD-10-CM | POA: Diagnosis not present

## 2015-10-04 DIAGNOSIS — I5032 Chronic diastolic (congestive) heart failure: Secondary | ICD-10-CM | POA: Diagnosis not present

## 2015-10-04 DIAGNOSIS — I779 Disorder of arteries and arterioles, unspecified: Secondary | ICD-10-CM | POA: Diagnosis not present

## 2015-10-04 DIAGNOSIS — I5033 Acute on chronic diastolic (congestive) heart failure: Secondary | ICD-10-CM

## 2015-10-04 DIAGNOSIS — I739 Peripheral vascular disease, unspecified: Secondary | ICD-10-CM

## 2015-10-04 DIAGNOSIS — I509 Heart failure, unspecified: Secondary | ICD-10-CM | POA: Diagnosis not present

## 2015-10-04 DIAGNOSIS — I10 Essential (primary) hypertension: Secondary | ICD-10-CM

## 2015-10-04 NOTE — Patient Instructions (Signed)
Medication Instructions:  No changes. Continue your current medications as listed.  Labwork: BMET today.  Testing/Procedures: None   Follow-Up: Dr. Ena Dawley 8 weeks.   Any Other Special Instructions Will Be Listed Below (If Applicable).

## 2015-10-04 NOTE — Telephone Encounter (Signed)
Daughter notified of appt for pt with Dr. Meda Coffee 12/04/15 @ 3:30. Also repeat carotid u/s to be done in 1 year. Daughter verbalized understanding.

## 2015-10-05 ENCOUNTER — Telehealth: Payer: Self-pay | Admitting: *Deleted

## 2015-10-05 LAB — BASIC METABOLIC PANEL
BUN: 33 mg/dL — AB (ref 7–25)
CALCIUM: 9.3 mg/dL (ref 8.6–10.4)
CO2: 26 mmol/L (ref 20–31)
CREATININE: 1.61 mg/dL — AB (ref 0.60–0.88)
Chloride: 99 mmol/L (ref 98–110)
Glucose, Bld: 131 mg/dL — ABNORMAL HIGH (ref 65–99)
Potassium: 4.1 mmol/L (ref 3.5–5.3)
Sodium: 137 mmol/L (ref 135–146)

## 2015-10-05 NOTE — Telephone Encounter (Signed)
Pt notified of lab results by phone with verbal understanding. Pt stated she wanted to thank Richardson Dopp, PA and I for how pleasant and thorough we were. She also stated how great Lanny Hurst is in the lab. I thanked pt for her accolades.

## 2015-10-06 ENCOUNTER — Other Ambulatory Visit: Payer: Self-pay | Admitting: Cardiology

## 2015-10-12 ENCOUNTER — Ambulatory Visit (INDEPENDENT_AMBULATORY_CARE_PROVIDER_SITE_OTHER): Payer: Medicare HMO | Admitting: Pharmacist

## 2015-10-12 DIAGNOSIS — Z5181 Encounter for therapeutic drug level monitoring: Secondary | ICD-10-CM

## 2015-10-12 DIAGNOSIS — Z7901 Long term (current) use of anticoagulants: Secondary | ICD-10-CM | POA: Diagnosis not present

## 2015-10-12 DIAGNOSIS — I2699 Other pulmonary embolism without acute cor pulmonale: Secondary | ICD-10-CM | POA: Diagnosis not present

## 2015-10-12 DIAGNOSIS — I4891 Unspecified atrial fibrillation: Secondary | ICD-10-CM

## 2015-10-12 LAB — POCT INR: INR: 2.4

## 2015-10-20 ENCOUNTER — Encounter: Payer: Self-pay | Admitting: Internal Medicine

## 2015-10-20 ENCOUNTER — Ambulatory Visit (INDEPENDENT_AMBULATORY_CARE_PROVIDER_SITE_OTHER): Payer: Medicare HMO | Admitting: Internal Medicine

## 2015-10-20 ENCOUNTER — Other Ambulatory Visit (INDEPENDENT_AMBULATORY_CARE_PROVIDER_SITE_OTHER): Payer: Medicare HMO

## 2015-10-20 VITALS — BP 136/84 | HR 87 | Temp 98.7°F | Resp 16 | Ht 72.0 in | Wt 238.1 lb

## 2015-10-20 DIAGNOSIS — D509 Iron deficiency anemia, unspecified: Secondary | ICD-10-CM

## 2015-10-20 DIAGNOSIS — E1142 Type 2 diabetes mellitus with diabetic polyneuropathy: Secondary | ICD-10-CM

## 2015-10-20 DIAGNOSIS — E1141 Type 2 diabetes mellitus with diabetic mononeuropathy: Secondary | ICD-10-CM | POA: Diagnosis not present

## 2015-10-20 DIAGNOSIS — D649 Anemia, unspecified: Secondary | ICD-10-CM

## 2015-10-20 DIAGNOSIS — I10 Essential (primary) hypertension: Secondary | ICD-10-CM | POA: Diagnosis not present

## 2015-10-20 LAB — CBC
HCT: 37.8 % (ref 36.0–46.0)
Hemoglobin: 12.2 g/dL (ref 12.0–15.0)
MCHC: 32.3 g/dL (ref 30.0–36.0)
MCV: 85.8 fl (ref 78.0–100.0)
PLATELETS: 320 10*3/uL (ref 150.0–400.0)
RBC: 4.41 Mil/uL (ref 3.87–5.11)
RDW: 21.5 % — ABNORMAL HIGH (ref 11.5–15.5)
WBC: 4.7 10*3/uL (ref 4.0–10.5)

## 2015-10-20 MED ORDER — ALPRAZOLAM 0.25 MG PO TABS: 0.2500 mg | ORAL_TABLET | Freq: Every day | ORAL | Status: DC | PRN

## 2015-10-20 MED ORDER — HYDROCODONE-ACETAMINOPHEN 5-325 MG PO TABS: 1.0000 | ORAL_TABLET | Freq: Four times a day (QID) | ORAL | Status: DC | PRN

## 2015-10-20 NOTE — Patient Instructions (Signed)
We have given you the prescriptions today and will check the blood counts today.   Fall Prevention in the Home  Falls can cause injuries and can affect people from all age groups. There are many simple things that you can do to make your home safe and to help prevent falls. WHAT CAN I DO ON THE OUTSIDE OF MY HOME?  Regularly repair the edges of walkways and driveways and fix any cracks.  Remove high doorway thresholds.  Trim any shrubbery on the main path into your home.  Use bright outdoor lighting.  Clear walkways of debris and clutter, including tools and rocks.  Regularly check that handrails are securely fastened and in good repair. Both sides of any steps should have handrails.  Install guardrails along the edges of any raised decks or porches.  Have leaves, snow, and ice cleared regularly.  Use sand or salt on walkways during winter months.  In the garage, clean up any spills right away, including grease or oil spills. WHAT CAN I DO IN THE BATHROOM?  Use night lights.  Install grab bars by the toilet and in the tub and shower. Do not use towel bars as grab bars.  Use non-skid mats or decals on the floor of the tub or shower.  If you need to sit down while you are in the shower, use a plastic, non-slip stool.Marland Kitchen  Keep the floor dry. Immediately clean up any water that spills on the floor.  Remove soap buildup in the tub or shower on a regular basis.  Attach bath mats securely with double-sided non-slip rug tape.  Remove throw rugs and other tripping hazards from the floor. WHAT CAN I DO IN THE BEDROOM?  Use night lights.  Make sure that a bedside light is easy to reach.  Do not use oversized bedding that drapes onto the floor.  Have a firm chair that has side arms to use for getting dressed.  Remove throw rugs and other tripping hazards from the floor. WHAT CAN I DO IN THE KITCHEN?   Clean up any spills right away.  Avoid walking on wet floors.  Place  frequently used items in easy-to-reach places.  If you need to reach for something above you, use a sturdy step stool that has a grab bar.  Keep electrical cables out of the way.  Do not use floor polish or wax that makes floors slippery. If you have to use wax, make sure that it is non-skid floor wax.  Remove throw rugs and other tripping hazards from the floor. WHAT CAN I DO IN THE STAIRWAYS?  Do not leave any items on the stairs.  Make sure that there are handrails on both sides of the stairs. Fix handrails that are broken or loose. Make sure that handrails are as long as the stairways.  Check any carpeting to make sure that it is firmly attached to the stairs. Fix any carpet that is loose or worn.  Avoid having throw rugs at the top or bottom of stairways, or secure the rugs with carpet tape to prevent them from moving.  Make sure that you have a light switch at the top of the stairs and the bottom of the stairs. If you do not have them, have them installed. WHAT ARE SOME OTHER FALL PREVENTION TIPS?  Wear closed-toe shoes that fit well and support your feet. Wear shoes that have rubber soles or low heels.  When you use a stepladder, make sure that it is  completely opened and that the sides are firmly locked. Have someone hold the ladder while you are using it. Do not climb a closed stepladder.  Add color or contrast paint or tape to grab bars and handrails in your home. Place contrasting color strips on the first and last steps.  Use mobility aids as needed, such as canes, walkers, scooters, and crutches.  Turn on lights if it is dark. Replace any light bulbs that burn out.  Set up furniture so that there are clear paths. Keep the furniture in the same spot.  Fix any uneven floor surfaces.  Choose a carpet design that does not hide the edge of steps of a stairway.  Be aware of any and all pets.  Review your medicines with your healthcare provider. Some medicines can cause  dizziness or changes in blood pressure, which increase your risk of falling. Talk with your health care provider about other ways that you can decrease your risk of falls. This may include working with a physical therapist or trainer to improve your strength, balance, and endurance.   This information is not intended to replace advice given to you by your health care provider. Make sure you discuss any questions you have with your health care provider.   Document Released: 09/06/2002 Document Revised: 01/31/2015 Document Reviewed: 10/21/2014 Elsevier Interactive Patient Education [employer].

## 2015-10-20 NOTE — Progress Notes (Signed)
Pre visit review using our clinic review tool, if applicable. No additional management support is needed unless otherwise documented below in the visit note. 

## 2015-10-22 DIAGNOSIS — D509 Iron deficiency anemia, unspecified: Secondary | ICD-10-CM | POA: Insufficient documentation

## 2015-10-22 NOTE — Progress Notes (Signed)
   Subjective:    Patient ID: [name] [name], female    DOB: [birth year], 82 y.o.   MRN: [id number]  HPI The patient is an 82 YO female coming in new for care on her chronic conditions including her diabetes (complicated with neuropathy to knees, taking metformin and gabapentin, controlled per her reports), her blood pressure (controlled on lasix and metoprolol, not known to be complicated) and her leg swelling (worse in the afternoon, lasix helps some, not worse recently). No new concerns.   PMH, [facility], social history reviewed and updated.   Review of Systems  Constitutional: Positive for activity change and fatigue. Negative for fever, chills, appetite change and unexpected weight change.  HENT: Negative.   Eyes: Negative.   Respiratory: Negative for cough, chest tightness, shortness of breath and wheezing.   Cardiovascular: Positive for leg swelling. Negative for chest pain and palpitations.  Gastrointestinal: Negative for nausea, abdominal pain, diarrhea, constipation and abdominal distention.  Musculoskeletal: Negative.   Skin: Negative.   Neurological: Positive for numbness. Negative for dizziness, syncope, weakness and headaches.  Psychiatric/Behavioral: Negative.       Objective:   Physical Exam  Constitutional: She is oriented to person, place, and time. She appears well-developed and well-nourished.  HENT:  Head: Normocephalic and atraumatic.  Eyes: EOM are normal.  Neck: Normal range of motion.  Cardiovascular: Normal rate and regular rhythm.   Pulmonary/Chest: Effort normal and breath sounds normal. No respiratory distress. She has no wheezes. She has no rales.  Abdominal: Soft. Bowel sounds are normal. She exhibits no distension. There is no tenderness. There is no rebound.  Musculoskeletal: She exhibits edema.  1-2+ edema to mid shin bilaterally  Neurological: She is alert and oriented to person, place, and time.  Skin: Skin is warm and dry.  Psychiatric: She has a  normal mood and affect.   Filed Vitals:   10/20/15 1529  BP: 136/84  Pulse: 87  Temp: 98.7 F (37.1 C)  TempSrc: Oral  Resp: 16  Height: 6' (1.829 m)  Weight: 238 lb 1.9 oz (108.011 kg)  SpO2: 95%      Assessment & Plan:

## 2015-10-22 NOTE — Assessment & Plan Note (Signed)
BP is well controlled on lasix and metoprolol. Checking labs and adjust as needed.

## 2015-10-22 NOTE — Assessment & Plan Note (Signed)
Checked labs on her today, adjust as needed. On metformin and no ACE-I. Foot exam done. Complicated by neuropathy which is stable now but has worsened in the last 3-5 years.

## 2015-10-22 NOTE — Assessment & Plan Note (Signed)
Checking CBC for improvement after starting iron in December. She likely has mixed anemia with some anemia of chronic illness. She is taking B12 and iron. No blood that she has seen.

## 2015-10-22 NOTE — Assessment & Plan Note (Signed)
Stable although this does limit her stability and walking due to balance problems.

## 2015-11-09 ENCOUNTER — Ambulatory Visit (INDEPENDENT_AMBULATORY_CARE_PROVIDER_SITE_OTHER): Payer: Medicare HMO | Admitting: *Deleted

## 2015-11-09 DIAGNOSIS — Z7901 Long term (current) use of anticoagulants: Secondary | ICD-10-CM | POA: Diagnosis not present

## 2015-11-09 DIAGNOSIS — I2699 Other pulmonary embolism without acute cor pulmonale: Secondary | ICD-10-CM

## 2015-11-09 DIAGNOSIS — I4891 Unspecified atrial fibrillation: Secondary | ICD-10-CM | POA: Diagnosis not present

## 2015-11-09 LAB — POCT INR: INR: 2.1

## 2015-11-22 ENCOUNTER — Encounter: Payer: Self-pay | Admitting: Cardiology

## 2015-11-28 DIAGNOSIS — C50912 Malignant neoplasm of unspecified site of left female breast: Secondary | ICD-10-CM | POA: Diagnosis not present

## 2015-12-04 ENCOUNTER — Ambulatory Visit (INDEPENDENT_AMBULATORY_CARE_PROVIDER_SITE_OTHER): Payer: Medicare HMO

## 2015-12-04 ENCOUNTER — Ambulatory Visit: Payer: Medicare HMO | Admitting: Cardiology

## 2015-12-04 ENCOUNTER — Ambulatory Visit (INDEPENDENT_AMBULATORY_CARE_PROVIDER_SITE_OTHER): Payer: Medicare HMO | Admitting: Cardiology

## 2015-12-04 ENCOUNTER — Encounter: Payer: Self-pay | Admitting: Cardiology

## 2015-12-04 VITALS — BP 140/70 | HR 64 | Ht 72.0 in | Wt 235.1 lb

## 2015-12-04 DIAGNOSIS — I4891 Unspecified atrial fibrillation: Secondary | ICD-10-CM | POA: Diagnosis not present

## 2015-12-04 DIAGNOSIS — I2699 Other pulmonary embolism without acute cor pulmonale: Secondary | ICD-10-CM | POA: Diagnosis not present

## 2015-12-04 DIAGNOSIS — Z7901 Long term (current) use of anticoagulants: Secondary | ICD-10-CM | POA: Diagnosis not present

## 2015-12-04 DIAGNOSIS — D509 Iron deficiency anemia, unspecified: Secondary | ICD-10-CM | POA: Diagnosis not present

## 2015-12-04 DIAGNOSIS — I10 Essential (primary) hypertension: Secondary | ICD-10-CM

## 2015-12-04 DIAGNOSIS — N289 Disorder of kidney and ureter, unspecified: Secondary | ICD-10-CM | POA: Diagnosis not present

## 2015-12-04 DIAGNOSIS — I5032 Chronic diastolic (congestive) heart failure: Secondary | ICD-10-CM

## 2015-12-04 DIAGNOSIS — D649 Anemia, unspecified: Secondary | ICD-10-CM

## 2015-12-04 DIAGNOSIS — I6523 Occlusion and stenosis of bilateral carotid arteries: Secondary | ICD-10-CM

## 2015-12-04 LAB — CBC WITH DIFFERENTIAL/PLATELET
BASOS PCT: 2 % — AB (ref 0–1)
Basophils Absolute: 0.1 10*3/uL (ref 0.0–0.1)
EOS ABS: 0.2 10*3/uL (ref 0.0–0.7)
Eosinophils Relative: 4 % (ref 0–5)
HCT: 38.3 % (ref 36.0–46.0)
Hemoglobin: 12.7 g/dL (ref 12.0–15.0)
Lymphocytes Relative: 28 % (ref 12–46)
Lymphs Abs: 1.1 10*3/uL (ref 0.7–4.0)
MCH: 28.8 pg (ref 26.0–34.0)
MCHC: 33.2 g/dL (ref 30.0–36.0)
MCV: 86.8 fL (ref 78.0–100.0)
MONOS PCT: 13 % — AB (ref 3–12)
MPV: 9.1 fL (ref 8.6–12.4)
Monocytes Absolute: 0.5 10*3/uL (ref 0.1–1.0)
Neutro Abs: 2 10*3/uL (ref 1.7–7.7)
Neutrophils Relative %: 53 % (ref 43–77)
PLATELETS: 235 10*3/uL (ref 150–400)
RBC: 4.41 MIL/uL (ref 3.87–5.11)
RDW: 17.5 % — ABNORMAL HIGH (ref 11.5–15.5)
WBC: 3.8 10*3/uL — ABNORMAL LOW (ref 4.0–10.5)

## 2015-12-04 LAB — BASIC METABOLIC PANEL
BUN: 31 mg/dL — ABNORMAL HIGH (ref 7–25)
CALCIUM: 9.3 mg/dL (ref 8.6–10.4)
CO2: 28 mmol/L (ref 20–31)
CREATININE: 1.47 mg/dL — AB (ref 0.60–0.88)
Chloride: 99 mmol/L (ref 98–110)
GLUCOSE: 144 mg/dL — AB (ref 65–99)
Potassium: 4 mmol/L (ref 3.5–5.3)
SODIUM: 139 mmol/L (ref 135–146)

## 2015-12-04 LAB — POCT INR: INR: 2.7

## 2015-12-04 NOTE — Progress Notes (Signed)
Cardiology Office Note   Date:  12/04/2015   ID:  [name] [name], DOB 1931/03/01, MRN [id number]  PCP:  Hoyt Koch, MD  Cardiologist:  Dr. Meda Coffee EP Dr. Rayann Heman    Chief Complaint  Patient presents with  . Leg Swelling      History of Present Illness: [name] [name] is a 82 y.o. female who presents for follow up for edema.   She has a hx of PAF, tachybradycardia syndrome status post PPM, diastolic HF, carotid stenosis, prior DVT/pulmonary embolism, HTN, DM2, breast CA. Patient has done well on low-dose flecainide. Dr. Ron Parker has recommended that she be on flecainide 50 mg twice a day in the past. She has been on Coumadin for anticoagulation. Last seen by Dr. Ron Parker 4/16. Last seen by Dr. Rayann Heman 6/16. At that time, she was on flecainide 100 mg twice a day. He reduced her flecainide back to 50 mg twice a day.  She was seen in the emergency room 09/24/15 with chest pain. According to the records, her chest pain had been evident since her brother passed away 3 months ago. Point-of-care cardiac markers were negative. ECG was unremarkable. Of note, hemoglobin was noted to be markedly depressed. Hgb in 7/16 was 11.3 >> Hgb 09/24/15 was 8.5. Hemoccult was neg in the ED.  She was volume overloaded and Mr Kathlen Mody increased her Lasix. Follow-up hemoglobin was stable. Iron studies demonstrated low iron and she was placed her on ferrous sulfate. She has since followed up with primary care.  Last Hgb was 12.2.   Follow-up echocardiogram demonstrated normal LV function.  She returns for follow-up. She is feeling better. She notes improved breathing. LE edema is improved now in ankles only.  . She denies chest pain. She sleeps on 2 pillows chronically. Denies PND, syncope.  She has stopped potato chips and watches her salt though still with processed foods.  Reminded her about canned foods.     Past Medical History  Diagnosis Date  . Hypertension   . Diabetes mellitus   . Tachy-brady  syndrome ([place])     s/p pacer 07/2009  . Atrial fibrillation (HCC)     flecainide; coumadin  . Atrial flutter ([place])     November, 2010, flecainide, Coumadin  . Chronic diastolic heart failure (HCC)     echo 10/10: mild LVH, EF 55-60%, mild AI, mild MR, mild to mod LAE, mild RVE, severe RAE, mod TR, PASP 53;  Echo 3/14:  Mild LVH, EF 55%, Tr AI, MAC, mild MR, mod LAE, PASP 31, trivial eff.// echo 1/17: EF 55-60%, mild AI, MAC, mild MR, moderate BAE, moderate TR  . Shortness of breath     myoview 11/10: EF 65%, no ischemia  . Pulmonary HTN ([place])     53 mmHg echo, 2010, moderate TR, mild right ventricular enlargement  . DVT (deep venous thrombosis) (HCC)     Pulmonary embolus  . Pulmonary embolus [facility]) 2006    2006, with DVT  . Carotid artery disease ([place])     [id number]: RICA [id number], LICA [id number]  . Thyroid nodule     non-neoplastic goiter  . Breast cancer ([place])   . Ejection fraction     EF 60%, echo, October, 2010  . Warfarin anticoagulation     Atrial fibrillation  . Pacemaker     November, 2010, Dr Rayann Heman - MDT ADDRL1 [place] DC PPM ser #: [id number]    . S/P laminectomy     June 22, 2011  . Syncope  2010, treated with pacemaker  . Drug therapy     FLECAINIDE  . Mitral regurgitation     mild, echo, October, 2010  . Mediastinal lymphadenopathy   . Venous insufficiency     Chronic  . Hypokalemia     October, 2012, potassium dose increased  . Hiatal hernia 2006    EGD  . Acute gastric ulcer without mention of hemorrhage, perforation, or obstruction 2006    EGD  . Esophagitis, unspecified 2006    EGD  . Allergy     seasonal  . Diverticulosis   . Hx of adenomatous colonic polyps   . Fecal incontinence   . Breast cancer ([place])   . Osteopenia   . Diabetic peripheral neuropathy ([place]) 03/09/2015    Past Surgical History  Procedure Laterality Date  . Breast surgery      Left  . Abdominal hysterectomy    . Back surgery      1973, 2012   . Ankle surgery        Current Outpatient Prescriptions  Medication Sig Dispense Refill  . ALPRAZolam (XANAX) 0.25 MG tablet Take 1 tablet (0.25 mg total) by mouth daily as needed for anxiety. 30 tablet 0  . bethanechol (URECHOLINE) 25 MG tablet Take 25 mg by mouth daily.     . Ferrous Sulfate (IRON) 325 (65 Fe) MG TABS Take 325 mg by mouth 2 (two) times daily. 60 each 11  . flecainide (TAMBOCOR) 100 MG tablet TAKE 1 TABLET BY MOUTH TWICE A DAY 180 tablet 3  . furosemide (LASIX) 80 MG tablet Take 1 tablet (80 mg total) by mouth 2 (two) times daily. 180 tablet 3  . GABAPENTIN PO Take 300 mg by mouth 2 (two) times daily.     Marland Kitchen HYDROcodone-acetaminophen (NORCO/VICODIN) 5-325 MG tablet Take 1 tablet by mouth every 6 (six) hours as needed for moderate pain. 30 tablet 0  . metFORMIN (GLUCOPHAGE) 500 MG tablet Take 500 mg by mouth daily.     . metoprolol succinate (TOPROL-XL) 25 MG 24 hr tablet Take 1/2 (12.5 mg) tablet by mouth daily 45 tablet 3  . nitrofurantoin (MACRODANTIN) 100 MG capsule Take 100 mg by mouth daily.    . potassium chloride 20 MEQ/15ML (10%) solution Take 20 mEq by mouth daily.     Marland Kitchen pyridoxine (B-6) 200 MG tablet Take 200 mg by mouth daily.    . vitamin B-12 (CYANOCOBALAMIN) 500 MCG tablet Take 500 mcg by mouth daily.     Marland Kitchen warfarin (COUMADIN) 5 MG tablet TAKE 1 TABLET (5 MG TOTAL) BY MOUTH ONE TIME ONLY AT 6 PM. 30 tablet 3   No current facility-administered medications for this visit.    Allergies:   Morphine and Morphine and related    Social History:  The patient  reports that she quit smoking about 17 years ago. She has never used smokeless tobacco. She reports that she does not drink alcohol or use illicit drugs.   Family History:  The patient's family history includes COPD in her father; Heart disease in her mother; Hypertension in her brother, daughter, father, mother, and sister. There is no history of Colon cancer, Heart attack, or Stroke.    ROS:  General:no colds or fevers, 3  lb  weight loss Skin:no rashes or ulcers HEENT:no blurred vision, no congestion CV:see HPI PUL:see HPI GI:no diarrhea constipation or melena, her stools are dark from the iron. no indigestion GU:no hematuria, no dysuria MS:no joint pain, no claudication Neuro:no syncope, no  lightheadedness Endo:+ diabetes but controlled per pt. , no thyroid disease  Wt Readings from Last 3 Encounters:  12/04/15 235 lb 1.9 oz (106.65 kg)  10/20/15 238 lb 1.9 oz (108.011 kg)  10/04/15 237 lb 12.8 oz (107.865 kg)     PHYSICAL EXAM: VS:  BP 140/70 mmHg  Pulse 64  Ht 6' (1.829 m)  Wt 235 lb 1.9 oz (106.65 kg)  BMI 31.88 kg/m2 , BMI Body mass index is 31.88 kg/(m^2). General:Pleasant affect, NAD Skin:Warm and dry, brisk capillary refill HEENT:normocephalic, sclera clear, mucus membranes moist Neck:supple, no JVD, no bruits  Heart:S1S2 RRR without murmur, gallup, rub or click Lungs:clear without rales, rhonchi, or wheezes [id number], non tender, + BS, do not palpate liver spleen or masses Ext:no lower ext edema, 2+ pedal pulses, 2+ radial pulses Neuro:alert and oriented, MAE, follows commands, + facial symmetry    EKG:  EKG is NOT ordered today.    Recent Labs: 10/04/2015: BUN 33*; Creat 1.61*; Potassium 4.1; Sodium 137 10/20/2015: Hemoglobin 12.2; Platelets 320.0    Lipid Panel No results found for: CHOL, TRIG, HDL, CHOLHDL, VLDL, LDLCALC, LDLDIRECT     Other studies Reviewed: Additional studies/ records that were reviewed today include:  . ECHO: Study Conclusions  - Left ventricle: The cavity size was normal. Wall thickness was normal. Systolic function was normal. The estimated ejection fraction was in the range of 55% to 60%. - Aortic valve: There was mild regurgitation. - Mitral valve: Calcified annulus. There was mild regurgitation. - Left atrium: The atrium was moderately dilated. - Right atrium: The atrium was moderately dilated. - Atrial septum: No defect or patent  foramen ovale was identified. - Tricuspid valve: There was moderate regurgitation.   ASSESSMENT AND PLAN:  1.  Chronic diastolic HF now with edema of ankles only.  I ordered support stockings, we discussed salt, will keep on lasix at current dose but will check bmp today.  Follow up with Dr. Meda Coffee in 3 months. She will call if increased swelling or SOB.  2. Normocytic anemia now stable on Iron.  Will recheck today.  3. PAF no awareness of rapid HR.  4. HTN- essential    5. Carotid diz. Bil.    Current medicines are reviewed with the patient today.  The patient Has no concerns regarding medicines.  The following changes have been made:  See above Labs/ tests ordered today include:see above  Disposition:   FU:  see above  Signed, Isaiah Serge, NP  12/04/2015 4:50 PM    [facility] [place], [place], [place], [place] Phone: [phone]; Fax: [phone]

## 2015-12-04 NOTE — Patient Instructions (Signed)
Medication Instructions:  Your physician recommends that you continue on your current medications as directed. Please refer to the Current Medication list given to you today.   Labwork: TODAY  BMET  CBC  Testing/Procedures: NONE Follow-Up: Your physician recommends that you schedule a follow-up appointment in:  3 MONTHS  WITH  DR  Meda Coffee Any Other Special Instructions Will Be Listed Below (If Applicable).     If you need a refill on your cardiac medications before your next appointment, please call your pharmacy.

## 2015-12-05 ENCOUNTER — Telehealth: Payer: Self-pay | Admitting: *Deleted

## 2015-12-05 DIAGNOSIS — I4891 Unspecified atrial fibrillation: Secondary | ICD-10-CM

## 2015-12-05 NOTE — Telephone Encounter (Signed)
Pt aware of her lab results and will repeat cbc 01/01/16 @ time of her coumadin appt. Pt verbalized understanding.

## 2015-12-08 ENCOUNTER — Other Ambulatory Visit: Payer: Self-pay | Admitting: Cardiology

## 2015-12-31 ENCOUNTER — Other Ambulatory Visit: Payer: Self-pay | Admitting: Cardiology

## 2016-01-01 ENCOUNTER — Ambulatory Visit (INDEPENDENT_AMBULATORY_CARE_PROVIDER_SITE_OTHER): Payer: Medicare HMO

## 2016-01-01 ENCOUNTER — Other Ambulatory Visit: Payer: Medicare HMO | Admitting: *Deleted

## 2016-01-01 DIAGNOSIS — I2699 Other pulmonary embolism without acute cor pulmonale: Secondary | ICD-10-CM

## 2016-01-01 DIAGNOSIS — I4891 Unspecified atrial fibrillation: Secondary | ICD-10-CM | POA: Diagnosis not present

## 2016-01-01 DIAGNOSIS — Z7901 Long term (current) use of anticoagulants: Secondary | ICD-10-CM

## 2016-01-01 LAB — CBC
HEMATOCRIT: 37.5 % (ref 35.0–45.0)
HEMOGLOBIN: 12.8 g/dL (ref 11.7–15.5)
MCH: 29.7 pg (ref 27.0–33.0)
MCHC: 34.1 g/dL (ref 32.0–36.0)
MCV: 87 fL (ref 80.0–100.0)
MPV: 9.6 fL (ref 7.5–12.5)
Platelets: 252 10*3/uL (ref 140–400)
RBC: 4.31 MIL/uL (ref 3.80–5.10)
RDW: 16 % — ABNORMAL HIGH (ref 11.0–15.0)
WBC: 4.8 10*3/uL (ref 3.8–10.8)

## 2016-01-01 LAB — POCT INR: INR: 2.9

## 2016-01-02 DIAGNOSIS — E1142 Type 2 diabetes mellitus with diabetic polyneuropathy: Secondary | ICD-10-CM | POA: Diagnosis not present

## 2016-01-02 DIAGNOSIS — L97521 Non-pressure chronic ulcer of other part of left foot limited to breakdown of skin: Secondary | ICD-10-CM | POA: Diagnosis not present

## 2016-01-03 DIAGNOSIS — L97521 Non-pressure chronic ulcer of other part of left foot limited to breakdown of skin: Secondary | ICD-10-CM | POA: Diagnosis not present

## 2016-01-10 DIAGNOSIS — B351 Tinea unguium: Secondary | ICD-10-CM | POA: Diagnosis not present

## 2016-01-10 DIAGNOSIS — E1142 Type 2 diabetes mellitus with diabetic polyneuropathy: Secondary | ICD-10-CM | POA: Diagnosis not present

## 2016-01-10 DIAGNOSIS — L851 Acquired keratosis [keratoderma] palmaris et plantaris: Secondary | ICD-10-CM | POA: Diagnosis not present

## 2016-01-10 DIAGNOSIS — L97521 Non-pressure chronic ulcer of other part of left foot limited to breakdown of skin: Secondary | ICD-10-CM | POA: Diagnosis not present

## 2016-01-17 DIAGNOSIS — E1142 Type 2 diabetes mellitus with diabetic polyneuropathy: Secondary | ICD-10-CM | POA: Diagnosis not present

## 2016-01-17 DIAGNOSIS — L97521 Non-pressure chronic ulcer of other part of left foot limited to breakdown of skin: Secondary | ICD-10-CM | POA: Diagnosis not present

## 2016-01-19 ENCOUNTER — Other Ambulatory Visit: Payer: Self-pay | Admitting: Cardiology

## 2016-01-20 ENCOUNTER — Other Ambulatory Visit: Payer: Self-pay | Admitting: Cardiology

## 2016-01-29 ENCOUNTER — Ambulatory Visit (INDEPENDENT_AMBULATORY_CARE_PROVIDER_SITE_OTHER): Payer: Medicare HMO | Admitting: *Deleted

## 2016-01-29 DIAGNOSIS — I4891 Unspecified atrial fibrillation: Secondary | ICD-10-CM | POA: Diagnosis not present

## 2016-01-29 DIAGNOSIS — Z7901 Long term (current) use of anticoagulants: Secondary | ICD-10-CM | POA: Diagnosis not present

## 2016-01-29 DIAGNOSIS — I2699 Other pulmonary embolism without acute cor pulmonale: Secondary | ICD-10-CM | POA: Diagnosis not present

## 2016-01-29 LAB — POCT INR: INR: 2.1

## 2016-02-02 ENCOUNTER — Encounter: Payer: Self-pay | Admitting: Cardiology

## 2016-02-07 DIAGNOSIS — L97521 Non-pressure chronic ulcer of other part of left foot limited to breakdown of skin: Secondary | ICD-10-CM | POA: Diagnosis not present

## 2016-02-12 ENCOUNTER — Telehealth: Payer: Self-pay | Admitting: *Deleted

## 2016-02-12 NOTE — Telephone Encounter (Signed)
Pts daughter called to inform us that pt took Doxy 100mg s BID for 7 days, completed regimen on 02/10/16.

## 2016-02-27 DIAGNOSIS — L97521 Non-pressure chronic ulcer of other part of left foot limited to breakdown of skin: Secondary | ICD-10-CM | POA: Diagnosis not present

## 2016-02-27 DIAGNOSIS — E1142 Type 2 diabetes mellitus with diabetic polyneuropathy: Secondary | ICD-10-CM | POA: Diagnosis not present

## 2016-03-02 DIAGNOSIS — E119 Type 2 diabetes mellitus without complications: Secondary | ICD-10-CM | POA: Diagnosis not present

## 2016-03-02 DIAGNOSIS — Z0100 Encounter for examination of eyes and vision without abnormal findings: Secondary | ICD-10-CM | POA: Diagnosis not present

## 2016-03-04 ENCOUNTER — Ambulatory Visit (INDEPENDENT_AMBULATORY_CARE_PROVIDER_SITE_OTHER): Payer: Medicare HMO | Admitting: *Deleted

## 2016-03-04 DIAGNOSIS — I2699 Other pulmonary embolism without acute cor pulmonale: Secondary | ICD-10-CM

## 2016-03-04 DIAGNOSIS — I4891 Unspecified atrial fibrillation: Secondary | ICD-10-CM | POA: Diagnosis not present

## 2016-03-04 DIAGNOSIS — Z7901 Long term (current) use of anticoagulants: Secondary | ICD-10-CM | POA: Diagnosis not present

## 2016-03-04 LAB — POCT INR: INR: 2.1

## 2016-03-21 ENCOUNTER — Ambulatory Visit: Payer: Medicare HMO | Admitting: Cardiology

## 2016-03-27 ENCOUNTER — Encounter: Payer: Self-pay | Admitting: Nurse Practitioner

## 2016-03-27 NOTE — Progress Notes (Signed)
Electrophysiology Office Note Date: 03/28/2016  ID:  [name] [name], DOB December 27, 1930, MRN [id number]  PCP: Hoyt Koch, MD Primary Cardiologist: Meda Coffee Electrophysiologist: Allred  CC: Pacemaker follow-up  [name] [name] is a 82 y.o. female seen today for Dr Rayann Heman.  She presents today for routine electrophysiology followup.  Since last being seen in our clinic, the patient reports doing relatively well.  She has not been compliant with compression hose or low salt diet as previously recommended and has persistent LE edema.   She denies chest pain, palpitations, dyspnea, PND, orthopnea, nausea, vomiting, dizziness, syncope, weight gain, or early satiety.  Device History: MDT dual chamber PPM implanted 2010 for SSS/tachy-brady   Past Medical History  Diagnosis Date  . Hypertension   . Diabetes mellitus   . Tachy-brady syndrome ([place])     s/p pacer 07/2009  . Atrial fibrillation (HCC)     flecainide; coumadin  . Chronic diastolic heart failure (HCC)     echo 10/10: mild LVH, EF 55-60%, mild AI, mild MR, mild to mod LAE, mild RVE, severe RAE, mod TR, PASP 53;  Echo 3/14:  Mild LVH, EF 55%, Tr AI, MAC, mild MR, mod LAE, PASP 31, trivial eff.// echo 1/17: EF 55-60%, mild AI, MAC, mild MR, moderate BAE, moderate TR  . Pulmonary HTN ([place])     53 mmHg echo, 2010, moderate TR, mild right ventricular enlargement  . Pulmonary embolus [facility]) 2006    2006, with DVT  . Carotid artery disease ([place])     [id number]: RICA [id number], LICA [id number]  . Thyroid nodule     non-neoplastic goiter  . Breast cancer ([place])   . S/P laminectomy     June 22, 2011  . Mitral regurgitation     mild, echo, October, 2010  . Mediastinal lymphadenopathy   . Venous insufficiency     Chronic  . Hiatal hernia 2006    EGD  . Acute gastric ulcer without mention of hemorrhage, perforation, or obstruction 2006    EGD  . Esophagitis, unspecified 2006    EGD  . Diverticulosis   . Hx of adenomatous colonic  polyps   . Osteopenia   . Diabetic peripheral neuropathy ([place]) 03/09/2015   Past Surgical History  Procedure Laterality Date  . Breast surgery      Left  . Abdominal hysterectomy    . Back surgery      1973, 2012   . Ankle surgery      Current Outpatient Prescriptions  Medication Sig Dispense Refill  . ALPRAZolam (XANAX) 0.25 MG tablet Take 1 tablet (0.25 mg total) by mouth daily as needed for anxiety. 30 tablet 0  . bethanechol (URECHOLINE) 25 MG tablet Take 25 mg by mouth daily.     . flecainide (TAMBOCOR) 100 MG tablet TAKE 1 TABLET BY MOUTH TWICE A DAY 180 tablet 3  . furosemide (LASIX) 80 MG tablet Take 1 tablet (80 mg total) by mouth 2 (two) times daily. 180 tablet 3  . GABAPENTIN PO Take 300 mg by mouth 2 (two) times daily.     Marland Kitchen HYDROcodone-acetaminophen (NORCO/VICODIN) 5-325 MG tablet Take 1 tablet by mouth every 6 (six) hours as needed for moderate pain. 30 tablet 0  . metFORMIN (GLUCOPHAGE) 500 MG tablet Take 500 mg by mouth daily.     . metoprolol succinate (TOPROL-XL) 25 MG 24 hr tablet TAKE 1/2 (12.5 MG) TABLET BY MOUTH DAILY 45 tablet 3  . potassium chloride 20 MEQ/15ML (10%) solution Take 20  mEq by mouth daily.     Marland Kitchen pyridoxine (B-6) 200 MG tablet Take 200 mg by mouth daily.    . vitamin B-12 (CYANOCOBALAMIN) 500 MCG tablet Take 500 mcg by mouth daily.     Marland Kitchen warfarin (COUMADIN) 5 MG tablet Take as directed by Coumadin Clinic 30 tablet 3   No current facility-administered medications for this visit.    Allergies:   Morphine and Morphine and related   Social History: Social History   Social History  . Marital Status: Widowed    Spouse Name: N/A  . Number of Children: 3  . Years of Education: N/A   Occupational History  . Retired     Social History Main Topics  . Smoking status: Former Smoker -- 4.00 packs/day    Quit date: 10/26/1998  . Smokeless tobacco: Never Used  . Alcohol Use: No  . Drug Use: No  . Sexual Activity: Not on file   Other Topics  Concern  . Not on file   Social History Narrative   Widowed.  Lives in [place] by herself.  Avoids caffeine.  Very active, taking care of her brother who also lives by himself and is in his late 59's.    Family History: Family History  Problem Relation Age of Onset  . Heart disease Mother   . COPD Father   . Colon cancer Neg Hx   . Heart attack Neg Hx   . Hypertension Mother   . Hypertension Father   . Hypertension Sister   . Hypertension Brother   . Hypertension Daughter   . Stroke Neg Hx      Review of Systems: All other systems reviewed and are otherwise negative except as noted above.   Physical Exam: VS:  BP 148/86 mmHg  Pulse 79  Ht 6' (1.829 m)  Wt 227 lb (102.967 kg)  BMI 30.78 kg/m2 , BMI Body mass index is 30.78 kg/(m^2).  GEN- The patient is elderly appearing, alert and oriented x 3 today.   HEENT: normocephalic, atraumatic; sclera clear, conjunctiva pink; hearing intact; oropharynx clear; neck supple  Lungs- Clear to ausculation bilaterally, normal work of breathing.  No wheezes, rales, rhonchi Heart- Regular rate and rhythm, no murmurs, rubs or gallops  GI- soft, non-tender, non-distended, bowel sounds present  Extremities- no clubbing, cyanosis, 2+ BLE dependent edema MS- no significant deformity or atrophy Skin- warm and dry, no rash or lesion; PPM pocket well healed Psych- euthymic mood, full affect Neuro- strength and sensation are intact  PPM Interrogation- reviewed in detail today,  See PACEART report  EKG:  EKG is ordered today - pt on Flecainide. The ekg ordered today shows atrial pacing with intrinsic ventricular conduction - PR interval >488msec  Recent Labs: 09/27/2015: Brain Natriuretic Peptide 607.5* 12/04/2015: BUN 31*; Creat 1.47*; Potassium 4.0; Sodium 139 01/01/2016: Hemoglobin 12.8; Platelets 252   Wt Readings from Last 3 Encounters:  03/28/16 227 lb (102.967 kg)  03/28/16 227 lb (102.967 kg)  12/04/15 235 lb 1.9 oz (106.65 kg)      Other studies Reviewed: Additional studies/ records that were reviewed today include: Dr Rayann Heman and APP office notes  Assessment and Plan:  1.  Sick sinus syndrome Normal PPM function - RV threshold chronically elevated See Pace Art report She has memory issues and is not able to do remote monitoring PR interval significantly prolonged today by EKG and device interrogation (>48msec), device reprogrammed DDD to allow for more physiologic AV delays.  Her RV threshold is elevated  and this will affect battery longevity, other option would be to discontinue Flecainide.  Will leave with shortened AV delays for now and send note to Dr Rayann Heman for review.  2.  Paroxysmal atrial fibrillation Burden 0% by device interrogation today Continue Warfarin for CHADS2VASC of 4 Continue low dose Flecainide   3.  HTN Stable No change required today  Current medicines are reviewed at length with the patient today.   The patient does not have concerns regarding her medicines.  The following changes were made today:  none  Labs/ tests ordered today include: none  Orders Placed This Encounter  Procedures  . EKG 12-Lead     Disposition:   Follow up with device clinic 6 months, me in 1 year      Signed, Chanetta Marshall, NP 03/28/2016 9:12 AM  [facility] [address] [place] [address] [phone] (office) [phone] (fax)

## 2016-03-28 ENCOUNTER — Encounter: Payer: Self-pay | Admitting: Nurse Practitioner

## 2016-03-28 ENCOUNTER — Ambulatory Visit (INDEPENDENT_AMBULATORY_CARE_PROVIDER_SITE_OTHER): Payer: Medicare HMO | Admitting: Nurse Practitioner

## 2016-03-28 ENCOUNTER — Ambulatory Visit (INDEPENDENT_AMBULATORY_CARE_PROVIDER_SITE_OTHER): Payer: Medicare HMO | Admitting: Cardiology

## 2016-03-28 VITALS — BP 148/86 | HR 79 | Ht 72.0 in | Wt 227.0 lb

## 2016-03-28 DIAGNOSIS — I5033 Acute on chronic diastolic (congestive) heart failure: Secondary | ICD-10-CM

## 2016-03-28 DIAGNOSIS — I10 Essential (primary) hypertension: Secondary | ICD-10-CM

## 2016-03-28 DIAGNOSIS — I495 Sick sinus syndrome: Secondary | ICD-10-CM

## 2016-03-28 DIAGNOSIS — I480 Paroxysmal atrial fibrillation: Secondary | ICD-10-CM

## 2016-03-28 DIAGNOSIS — I5032 Chronic diastolic (congestive) heart failure: Secondary | ICD-10-CM

## 2016-03-28 DIAGNOSIS — I119 Hypertensive heart disease without heart failure: Secondary | ICD-10-CM | POA: Insufficient documentation

## 2016-03-28 DIAGNOSIS — I110 Hypertensive heart disease with heart failure: Secondary | ICD-10-CM

## 2016-03-28 LAB — CUP PACEART INCLINIC DEVICE CHECK
Implantable Lead Implant Date: 20101001
Implantable Lead Location: 753860
Implantable Lead Model: 5076
Implantable Lead Model: 5076
MDC IDC LEAD IMPLANT DT: 20101001
MDC IDC LEAD LOCATION: 753859
MDC IDC SESS DTM: 20170629092750

## 2016-03-28 MED ORDER — METOLAZONE 2.5 MG PO TABS: 2.5000 mg | ORAL_TABLET | Freq: Every day | ORAL | Status: DC

## 2016-03-28 NOTE — Patient Instructions (Signed)
Medication Instructions:   START TAKING METOLAZONE 2.5 MG ONCE DAILY---TAKE THIS 30 MINUTES PRIOR TO TAKING YOUR MORNING DOSE OF LASIX    Labwork:  BMET IN 2-3 WEEKS EITHER PRIOR TOO OR THE SAME DAY YOU COME INTO SEE AN EXTENDER IN OUR OFFICE    Follow-Up:  2-3 WEEKS WITH AN EXTENDER IN OUR OFFICE---PLEASE HAVE YOUR LAB DONE PRIOR TOO OR THE SAME DAY YOU SEE THE EXTENDER       If you need a refill on your cardiac medications before your next appointment, please call your pharmacy.

## 2016-03-28 NOTE — Progress Notes (Signed)
Cardiology Office Note   Date:  03/28/2016   ID:  [name] [name], DOB 1930/10/15, MRN [id number]  PCP:  Hoyt Koch, MD  Cardiologist:  Dr. Meda Coffee EP Dr. Rayann Heman    No chief complaint on file.     History of Present Illness: [name] [name] is a 82 y.o. female who presents for follow up for edema.   She has a hx of PAF, tachybradycardia syndrome status post PPM, diastolic HF, carotid stenosis, prior DVT/pulmonary embolism, HTN, DM2, breast CA. Patient has done well on low-dose flecainide. Dr. Ron Parker has recommended that she be on flecainide 50 mg twice a day in the past. She has been on Coumadin for anticoagulation. Last seen by Dr. Ron Parker 4/16. Last seen by Dr. Rayann Heman 6/16. At that time, she was on flecainide 100 mg twice a day. He reduced her flecainide back to 50 mg twice a day.  She was seen in the emergency room 09/24/15 with chest pain. According to the records, her chest pain had been evident since her brother passed away 3 months ago. Point-of-care cardiac markers were negative. ECG was unremarkable. Of note, hemoglobin was noted to be markedly depressed. Hgb in 7/16 was 11.3 >> Hgb 09/24/15 was 8.5. Hemoccult was neg in the ED.  She was volume overloaded and Mr Kathlen Mody increased her Lasix. Follow-up hemoglobin was stable. Iron studies demonstrated low iron and she was placed her on ferrous sulfate. She has since followed up with primary care.  Last Hgb was 12.2.   Follow-up echocardiogram demonstrated normal LV function.  03/28/2016 - the patient is coming after 2 months, she also had a pacemaker checkup today that showed good pacemaker function and 0 burden from atrial fibrillation. The patient has noticed worsening lower extremity edema, and dyspnea on exertion. She has chronic two-pillow orthopnea but no personal nocturnal dyspnea no chest pain claudications or syncope. She has been compliant with meds but doesn't use her compression stocking as they are  uncomfortable.   Past Medical History  Diagnosis Date  . Hypertension   . Diabetes mellitus   . Tachy-brady syndrome ([place])     s/p pacer 07/2009  . Atrial fibrillation (HCC)     flecainide; coumadin  . Chronic diastolic heart failure (HCC)     echo 10/10: mild LVH, EF 55-60%, mild AI, mild MR, mild to mod LAE, mild RVE, severe RAE, mod TR, PASP 53;  Echo 3/14:  Mild LVH, EF 55%, Tr AI, MAC, mild MR, mod LAE, PASP 31, trivial eff.// echo 1/17: EF 55-60%, mild AI, MAC, mild MR, moderate BAE, moderate TR  . Pulmonary HTN ([place])     53 mmHg echo, 2010, moderate TR, mild right ventricular enlargement  . Pulmonary embolus [facility]) 2006    2006, with DVT  . Carotid artery disease ([place])     [id number]: RICA [id number], LICA [id number]  . Thyroid nodule     non-neoplastic goiter  . Breast cancer ([place])   . S/P laminectomy     June 22, 2011  . Mitral regurgitation     mild, echo, October, 2010  . Mediastinal lymphadenopathy   . Venous insufficiency     Chronic  . Hiatal hernia 2006    EGD  . Acute gastric ulcer without mention of hemorrhage, perforation, or obstruction 2006    EGD  . Esophagitis, unspecified 2006    EGD  . Diverticulosis   . Hx of adenomatous colonic polyps   . Osteopenia   . Diabetic peripheral neuropathy ([place])  03/09/2015    Past Surgical History  Procedure Laterality Date  . Breast surgery      Left  . Abdominal hysterectomy    . Back surgery      1973, 2012   . Ankle surgery       Current Outpatient Prescriptions  Medication Sig Dispense Refill  . ALPRAZolam (XANAX) 0.25 MG tablet Take 1 tablet (0.25 mg total) by mouth daily as needed for anxiety. 30 tablet 0  . bethanechol (URECHOLINE) 25 MG tablet Take 25 mg by mouth daily.     . Ferrous Sulfate (IRON) 325 (65 Fe) MG TABS Take 325 mg by mouth 2 (two) times daily. 60 each 11  . flecainide (TAMBOCOR) 100 MG tablet TAKE 1 TABLET BY MOUTH TWICE A DAY 180 tablet 3  . furosemide (LASIX) 80 MG tablet Take 1 tablet (80  mg total) by mouth 2 (two) times daily. 180 tablet 3  . GABAPENTIN PO Take 300 mg by mouth 2 (two) times daily.     Marland Kitchen HYDROcodone-acetaminophen (NORCO/VICODIN) 5-325 MG tablet Take 1 tablet by mouth every 6 (six) hours as needed for moderate pain. 30 tablet 0  . metFORMIN (GLUCOPHAGE) 500 MG tablet Take 500 mg by mouth daily.     . metoprolol succinate (TOPROL-XL) 25 MG 24 hr tablet TAKE 1/2 (12.5 MG) TABLET BY MOUTH DAILY 45 tablet 3  . nitrofurantoin (MACRODANTIN) 100 MG capsule Take 100 mg by mouth daily.    . potassium chloride 20 MEQ/15ML (10%) solution Take 20 mEq by mouth daily.     Marland Kitchen pyridoxine (B-6) 200 MG tablet Take 200 mg by mouth daily.    . vitamin B-12 (CYANOCOBALAMIN) 500 MCG tablet Take 500 mcg by mouth daily.     Marland Kitchen warfarin (COUMADIN) 5 MG tablet Take as directed by Coumadin Clinic 30 tablet 3   No current facility-administered medications for this visit.    Allergies:   Morphine and Morphine and related    Social History:  The patient  reports that she quit smoking about 17 years ago. She has never used smokeless tobacco. She reports that she does not drink alcohol or use illicit drugs.   Family History:  The patient's family history includes COPD in her father; Heart disease in her mother; Hypertension in her brother, daughter, father, mother, and sister. There is no history of Colon cancer, Heart attack, or Stroke.    ROS:  General:no colds or fevers, 3 lb  weight loss Skin:no rashes or ulcers HEENT:no blurred vision, no congestion CV:see HPI PUL:see HPI GI:no diarrhea constipation or melena, her stools are dark from the iron. no indigestion GU:no hematuria, no dysuria MS:no joint pain, no claudication Neuro:no syncope, no lightheadedness Endo:+ diabetes but controlled per pt. , no thyroid disease  Wt Readings from Last 3 Encounters:  12/04/15 235 lb 1.9 oz (106.65 kg)  10/20/15 238 lb 1.9 oz (108.011 kg)  10/04/15 237 lb 12.8 oz (107.865 kg)     PHYSICAL  EXAM: VS:  There were no vitals taken for this visit. , BMI There is no weight on file to calculate BMI. General:Pleasant affect, NAD Skin:Warm and dry, brisk capillary refill HEENT:normocephalic, sclera clear, mucus membranes moist Neck:supple, no JVD, no bruits  Heart:S1S2 RRR without murmur, gallup, rub or click Lungs:clear without rales, rhonchi, or wheezes [id number], non tender, + BS, do not palpate liver spleen or masses Ext:no lower ext edema, 2+ pedal pulses, 2+ radial pulses Neuro:alert and oriented, MAE, follows commands, + facial symmetry  EKG:  Electronic atrial pacemaker with nonspecific intraventricular block.   Recent Labs: 09/27/2015: Brain Natriuretic Peptide 607.5* 12/04/2015: BUN 31*; Creat 1.47*; Potassium 4.0; Sodium 139 01/01/2016: Hemoglobin 12.8; Platelets 252   Lipid Panel No results found for: CHOL, TRIG, HDL, CHOLHDL, VLDL, LDLCALC, LDLDIRECT   Other studies Reviewed: Additional studies/ records that were reviewed today include:  . ECHO: Study Conclusions  - Left ventricle: The cavity size was normal. Wall thickness was normal. Systolic function was normal. The estimated ejection fraction was in the range of 55% to 60%. - Aortic valve: There was mild regurgitation. - Mitral valve: Calcified annulus. There was mild regurgitation. - Left atrium: The atrium was moderately dilated. - Right atrium: The atrium was moderately dilated. - Atrial septum: No defect or patent foramen ovale was identified. - Tricuspid valve: There was moderate regurgitation.   ASSESSMENT AND PLAN:  1.  Acute on chronic diastolic HF : The patient has crackles in the lung bases and lower extremity edema bilaterally, we will add metolazone 2.5 mg daily prior to her first furosemide and they will follow in 2-3 weeks with a PA with BMP on the day.  son in 3 months. She will call if increased swelling or SOB.  2. Normocytic anemia now stable on Iron.  Repeat hemoglobin was 12.8. No  need to continue iron anymore.  3. PAF - pacemaker interrogation today showed 0% atrial fibrillation burden.   4. SSS - Per PM clinic: "PR interval significantly prolonged today by EKG and device interrogation (>461msec), device reprogrammed DDD to allow for more physiologic AV delays. Her RV threshold is elevated and this will affect battery longevity, other option would be to discontinue Flecainide. Will leave with shortened AV delays for now and send note to Dr Rayann Heman for review."  5. HTN- slightly elevated today, we are adding metolazone will recheck at the next visit.   6. Carotid disease bilaterally, in January 2017 she had 1-39% stable right RCA stenosis and stable 40-59% stenosis on the left. We'll repeat in 1 year.   Follow up in 2 weeks with a PA with BMP. Signed, Ena Dawley, MD  03/28/2016 8:47 AM    [address], [place], [place] Phone: [phone]; Fax: [phone]

## 2016-03-28 NOTE — Patient Instructions (Signed)
Medication Instructions:   Your physician recommends that you continue on your current medications as directed. Please refer to the Current Medication list given to you today.   If you need a refill on your cardiac medications before your next appointment, please call your pharmacy.  Labwork: NONE ORDER TODAY    Testing/Procedures: NONE ORDER TODAY   Follow-Up: IN 6 MONTHS WITH DEVICE CLINIC  Your physician wants you to follow-up in: [place] will receive a reminder letter in the mail two months in advance. If you don't receive a letter, please call our office to schedule the follow-up appointment.     Any Other Special Instructions Will Be Listed Below (If Applicable).

## 2016-04-04 ENCOUNTER — Emergency Department (HOSPITAL_COMMUNITY): Payer: Medicare HMO

## 2016-04-04 ENCOUNTER — Emergency Department (HOSPITAL_COMMUNITY)
Admission: EM | Admit: 2016-04-04 | Discharge: 2016-04-05 | Disposition: A | Discharge status: Home / Self Care | Payer: Medicare HMO | Attending: Emergency Medicine | Admitting: Emergency Medicine

## 2016-04-04 ENCOUNTER — Encounter (HOSPITAL_COMMUNITY): Payer: Self-pay

## 2016-04-04 DIAGNOSIS — Z87891 Personal history of nicotine dependence: Secondary | ICD-10-CM | POA: Insufficient documentation

## 2016-04-04 DIAGNOSIS — E1140 Type 2 diabetes mellitus with diabetic neuropathy, unspecified: Secondary | ICD-10-CM | POA: Diagnosis not present

## 2016-04-04 DIAGNOSIS — Z7984 Long term (current) use of oral hypoglycemic drugs: Secondary | ICD-10-CM | POA: Diagnosis not present

## 2016-04-04 DIAGNOSIS — I5032 Chronic diastolic (congestive) heart failure: Secondary | ICD-10-CM | POA: Insufficient documentation

## 2016-04-04 DIAGNOSIS — I110 Hypertensive heart disease with heart failure: Secondary | ICD-10-CM | POA: Diagnosis not present

## 2016-04-04 DIAGNOSIS — Z7901 Long term (current) use of anticoagulants: Secondary | ICD-10-CM | POA: Diagnosis not present

## 2016-04-04 DIAGNOSIS — R0602 Shortness of breath: Secondary | ICD-10-CM | POA: Diagnosis not present

## 2016-04-04 DIAGNOSIS — Z79899 Other long term (current) drug therapy: Secondary | ICD-10-CM | POA: Diagnosis not present

## 2016-04-04 DIAGNOSIS — N390 Urinary tract infection, site not specified: Secondary | ICD-10-CM | POA: Diagnosis not present

## 2016-04-04 DIAGNOSIS — R069 Unspecified abnormalities of breathing: Secondary | ICD-10-CM | POA: Diagnosis not present

## 2016-04-04 DIAGNOSIS — N289 Disorder of kidney and ureter, unspecified: Secondary | ICD-10-CM | POA: Diagnosis not present

## 2016-04-04 DIAGNOSIS — Z853 Personal history of malignant neoplasm of breast: Secondary | ICD-10-CM | POA: Insufficient documentation

## 2016-04-04 DIAGNOSIS — R079 Chest pain, unspecified: Secondary | ICD-10-CM | POA: Diagnosis not present

## 2016-04-04 LAB — CBC WITH DIFFERENTIAL/PLATELET
Basophils Absolute: 0.1 10*3/uL (ref 0.0–0.1)
Basophils Relative: 1 %
EOS ABS: 0 10*3/uL (ref 0.0–0.7)
EOS PCT: 1 %
HCT: 38.9 % (ref 36.0–46.0)
Hemoglobin: 13 g/dL (ref 12.0–15.0)
LYMPHS ABS: 0.9 10*3/uL (ref 0.7–4.0)
Lymphocytes Relative: 18 %
MCH: 31 pg (ref 26.0–34.0)
MCHC: 33.4 g/dL (ref 30.0–36.0)
MCV: 92.6 fL (ref 78.0–100.0)
MONO ABS: 0.2 10*3/uL (ref 0.1–1.0)
Monocytes Relative: 4 %
Neutro Abs: 3.9 10*3/uL (ref 1.7–7.7)
Neutrophils Relative %: 76 %
PLATELETS: 318 10*3/uL (ref 150–400)
RBC: 4.2 MIL/uL (ref 3.87–5.11)
RDW: 13.8 % (ref 11.5–15.5)
WBC: 5.1 10*3/uL (ref 4.0–10.5)

## 2016-04-04 LAB — COMPREHENSIVE METABOLIC PANEL
ALT: 14 U/L (ref 14–54)
AST: 25 U/L (ref 15–41)
Albumin: 3.4 g/dL — ABNORMAL LOW (ref 3.5–5.0)
Alkaline Phosphatase: 97 U/L (ref 38–126)
Anion gap: 15 (ref 5–15)
BILIRUBIN TOTAL: 0.8 mg/dL (ref 0.3–1.2)
BUN: 48 mg/dL — ABNORMAL HIGH (ref 6–20)
CHLORIDE: 90 mmol/L — AB (ref 101–111)
CO2: 28 mmol/L (ref 22–32)
CREATININE: 2.17 mg/dL — AB (ref 0.44–1.00)
Calcium: 9.4 mg/dL (ref 8.9–10.3)
GFR, EST AFRICAN AMERICAN: 23 mL/min — AB (ref >60)
GFR, EST NON AFRICAN AMERICAN: 20 mL/min — AB (ref >60)
Glucose, Bld: 142 mg/dL — ABNORMAL HIGH (ref 65–99)
POTASSIUM: 3.3 mmol/L — AB (ref 3.5–5.1)
Sodium: 133 mmol/L — ABNORMAL LOW (ref 135–145)
TOTAL PROTEIN: 7.5 g/dL (ref 6.5–8.1)

## 2016-04-04 LAB — PROTIME-INR
INR: 1.8 — AB (ref 0.00–1.49)
Prothrombin Time: 20.8 seconds — ABNORMAL HIGH (ref 11.6–15.2)

## 2016-04-04 LAB — URINE MICROSCOPIC-ADD ON: RBC / HPF: NONE SEEN RBC/hpf (ref 0–5)

## 2016-04-04 LAB — I-STAT TROPONIN, ED
TROPONIN I, POC: 0 ng/mL (ref 0.00–0.08)
Troponin i, poc: 0 ng/mL (ref 0.00–0.08)

## 2016-04-04 LAB — URINALYSIS, ROUTINE W REFLEX MICROSCOPIC
Bilirubin Urine: NEGATIVE
GLUCOSE, UA: NEGATIVE mg/dL
HGB URINE DIPSTICK: NEGATIVE
Ketones, ur: NEGATIVE mg/dL
Nitrite: NEGATIVE
PH: 6.5 (ref 5.0–8.0)
PROTEIN: NEGATIVE mg/dL
Specific Gravity, Urine: 1.011 (ref 1.005–1.030)

## 2016-04-04 LAB — BRAIN NATRIURETIC PEPTIDE: B NATRIURETIC PEPTIDE 5: 437 pg/mL — AB (ref 0.0–100.0)

## 2016-04-04 MED ORDER — CEPHALEXIN 500 MG PO CAPS: 500.0000 mg | ORAL_CAPSULE | Freq: Three times a day (TID) | ORAL | Status: DC

## 2016-04-04 MED ORDER — DEXTROSE 5 % IV SOLN
1.0000 g | Freq: Once | INTRAVENOUS | Status: AC
  Administered 2016-04-04: 1 g via INTRAVENOUS
  Filled 2016-04-04: qty 10

## 2016-04-04 NOTE — ED Notes (Signed)
Call [phone] for [name] [name], who will pick to the pt up for d/c

## 2016-04-04 NOTE — ED Notes (Signed)
Pt aware we need urine sample, unable at this time, drinking water now.

## 2016-04-04 NOTE — ED Notes (Signed)
EDP at bedside  

## 2016-04-04 NOTE — ED Provider Notes (Addendum)
CSN: [id number]     Arrival date & time 04/04/16  1614 History   First MD Initiated Contact with Patient 04/04/16 1634     Chief Complaint  Patient presents with  . Shortness of Breath     (Consider location/radiation/quality/duration/timing/severity/associated sxs/prior Treatment) The history is provided by the patient.  [name] [name] is a 82 y.o. female history hypertension, diabetes, A. fib on Coumadin, CHF with a pacemaker here presenting with shortness of breath. She was in the kitchen earlier today and had acute onset of shortness of breath associated with some anxiety. She called her daughter, who called her son and came to check on her. EMS was called and she was transported to the ER. She states that her shortness of breath is resolved right now. Has chronic leg swelling. Per daughter, she sometimes gets this when she has a UTI. Denies flank pain or fever or vomiting. She saw cardiology a week ago and metolazone was added to the lasix for diuresis. She has been using compression stockings and states that leg swelling has improved.    Past Medical History  Diagnosis Date  . Hypertension   . Diabetes mellitus   . Tachy-brady syndrome ([place])     s/p pacer 07/2009  . Atrial fibrillation (HCC)     flecainide; coumadin  . Chronic diastolic heart failure (HCC)     echo 10/10: mild LVH, EF 55-60%, mild AI, mild MR, mild to mod LAE, mild RVE, severe RAE, mod TR, PASP 53;  Echo 3/14:  Mild LVH, EF 55%, Tr AI, MAC, mild MR, mod LAE, PASP 31, trivial eff.// echo 1/17: EF 55-60%, mild AI, MAC, mild MR, moderate BAE, moderate TR  . Pulmonary HTN ([place])     53 mmHg echo, 2010, moderate TR, mild right ventricular enlargement  . Pulmonary embolus [facility]) 2006    2006, with DVT  . Carotid artery disease ([place])     [id number]: RICA [id number], LICA 123456  . Thyroid nodule     non-neoplastic goiter  . Breast cancer ([place])   . S/P laminectomy     June 22, 2011  . Mitral regurgitation     mild, echo,  October, 2010  . Mediastinal lymphadenopathy   . Venous insufficiency     Chronic  . Hiatal hernia 2006    EGD  . Acute gastric ulcer without mention of hemorrhage, perforation, or obstruction 2006    EGD  . Esophagitis, unspecified 2006    EGD  . Diverticulosis   . Hx of adenomatous colonic polyps   . Osteopenia   . Diabetic peripheral neuropathy ([place]) 03/09/2015   Past Surgical History  Procedure Laterality Date  . Breast surgery      Left  . Abdominal hysterectomy    . Back surgery      1973, 2012   . Ankle surgery     Family History  Problem Relation Age of Onset  . Heart disease Mother   . COPD Father   . Colon cancer Neg Hx   . Heart attack Neg Hx   . Hypertension Mother   . Hypertension Father   . Hypertension Sister   . Hypertension Brother   . Hypertension Daughter   . Stroke Neg Hx    Social History  Substance Use Topics  . Smoking status: Former Smoker -- 4.00 packs/day    Quit date: 10/26/1998  . Smokeless tobacco: Never Used  . Alcohol Use: No   OB History    No data available  Review of Systems  Respiratory: Positive for shortness of breath.   All other systems reviewed and are negative.     Allergies  Morphine and Morphine and related  Home Medications   Prior to Admission medications   Medication Sig Start Date End Date Taking? Authorizing Provider  ALPRAZolam (XANAX) 0.25 MG tablet Take 1 tablet (0.25 mg total) by mouth daily as needed for anxiety. 10/20/15  Yes Hoyt Koch, MD  bethanechol (URECHOLINE) 25 MG tablet Take 25 mg by mouth daily.    Yes Historical Provider, MD  flecainide (TAMBOCOR) 100 MG tablet TAKE 1 TABLET BY MOUTH TWICE A DAY 10/06/15  Yes Carlena Bjornstad, MD  furosemide (LASIX) 80 MG tablet Take 1 tablet (80 mg total) by mouth 2 (two) times daily. 09/27/15  Yes Liliane Shi, PA-C  gabapentin (NEURONTIN) 300 MG capsule Take 300 mg by mouth 3 (three) times daily.    Yes Historical Provider, MD   HYDROcodone-acetaminophen (NORCO/VICODIN) 5-325 MG tablet Take 1 tablet by mouth every 6 (six) hours as needed for moderate pain. 10/20/15  Yes Hoyt Koch, MD  metFORMIN (GLUCOPHAGE) 500 MG tablet Take 500 mg by mouth daily.    Yes Historical Provider, MD  metolazone (ZAROXOLYN) 2.5 MG tablet Take 1 tablet (2.5 mg total) by mouth daily. Take this 30 minutes prior to taking your lasix in the morning 03/28/16  Yes Dorothy Spark, MD  metoprolol succinate (TOPROL-XL) 25 MG 24 hr tablet TAKE 1/2 (12.5 MG) TABLET BY MOUTH DAILY 12/08/15  Yes Dorothy Spark, MD  nitrofurantoin, macrocrystal-monohydrate, (MACROBID) 100 MG capsule Take 100 mg by mouth daily.   Yes Historical Provider, MD  potassium chloride 20 MEQ/15ML (10%) solution Take 20 mEq by mouth daily.    Yes Historical Provider, MD  pyridoxine (B-6) 200 MG tablet Take 200 mg by mouth daily.   Yes Historical Provider, MD  vitamin B-12 (CYANOCOBALAMIN) 500 MCG tablet Take 500 mcg by mouth daily.    Yes Historical Provider, MD  warfarin (COUMADIN) 5 MG tablet Take as directed by [facility]madin Clinic Patient taking differently: Take 2.5-5 mg by mouth See admin instructions. 2.5 mg daily on Sun/Mon/Tues/Wed/Fri/Sat and 5 mg on Thurs 01/01/16  Yes Dorothy Spark, MD   BP 105/60 mmHg  Pulse 60  Temp(Src) 97.5 F (36.4 C) (Oral)  Resp 16  SpO2 94% Physical Exam  Constitutional: She is oriented to person, place, and time.  Chronically ill, NAD   HENT:  Head: Normocephalic.  Mouth/Throat: Oropharynx is clear and moist.  Eyes: Conjunctivae are normal. Pupils are equal, round, and reactive to light.  Neck: Normal range of motion. Neck supple.  Cardiovascular: Normal rate, regular rhythm and normal heart sounds.   Pulmonary/Chest: Effort normal and breath sounds normal. No respiratory distress. She has no wheezes. She has no rales.  Abdominal: Soft. Bowel sounds are normal. She exhibits no distension. There is no tenderness. There is no  rebound.  Musculoskeletal: Normal range of motion.  1+ edema, compression stocking in place. No calf tenderness   Neurological: She is alert and oriented to person, place, and time.  Skin: Skin is warm and dry.  Psychiatric: She has a normal mood and affect. Her behavior is normal. Judgment and thought content normal.  Nursing note and vitals reviewed.   ED Course  Procedures (including critical care time) Labs Review Labs Reviewed  COMPREHENSIVE METABOLIC PANEL - Abnormal; Notable for the following:    Sodium 133 (*)    Potassium  3.3 (*)    Chloride 90 (*)    Glucose, Bld 142 (*)    BUN 48 (*)    Creatinine, Ser 2.17 (*)    Albumin 3.4 (*)    GFR calc non Af Amer 20 (*)    GFR calc Af Amer 23 (*)    All other components within normal limits  PROTIME-INR - Abnormal; Notable for the following:    Prothrombin Time 20.8 (*)    INR 1.80 (*)    All other components within normal limits  BRAIN NATRIURETIC PEPTIDE - Abnormal; Notable for the following:    B Natriuretic Peptide 437.0 (*)    All other components within normal limits  CBC WITH DIFFERENTIAL/PLATELET  URINALYSIS, ROUTINE W REFLEX MICROSCOPIC (NOT AT [facility])  I-STAT TROPOININ, ED  Randolm Idol, ED    Imaging Review Dg Chest 2 View  04/04/2016  CLINICAL DATA:  Shortness of breath, no chest pain EXAM: CHEST  2 VIEW COMPARISON:  09/24/2015 FINDINGS: There is no focal parenchymal opacity. There is no pleural effusion or pneumothorax. There is mild stable cardiomegaly. There is a dual lead AICD. Moderate size hiatal hernia. The osseous structures are unremarkable. IMPRESSION: No active cardiopulmonary disease. Electronically Signed   By: Kathreen Devoid   On: 04/04/2016 17:20   I have personally reviewed and evaluated these images and lab results as part of my medical decision-making.   EKG Interpretation   Date/Time:  Thursday April 04 2016 16:41:48 EDT Ventricular Rate:  60 PR Interval:    QRS Duration: 226 QT  Interval:  594 QTC Calculation: 594 R Axis:   -83 Text Interpretation:  Junctional rhythm Nonspecific IVCD with LAD LVH with  secondary repolarization abnormality LBBB new since previous  Confirmed by  Kaiden Pech  MD, Thien Berka ([id number]) on 04/04/2016 4:45:19 PM      MDM   Final diagnoses:  None    [name] Nudd is a 82 y.o. female here with shortness of breath that resolved. Well appearing, vitals stable. Lungs clear and patient's leg swelling improved and clinically not in overload. Will get labs, BNP, CXR, delta trop. She is on coumadin and not hypoxic or tachycardic and I doubt PE. Will get INR.   11:40 PM Delta trop neg. INR 1.8 but didn't take her night time dose today. Cr increased to 2.2 from 1.5 several months ago. This is likely from over diuresis. No JVD. BNP only 400. UA + UTI. Given rocephin and will dc home with keflex. Urine culture sent. Previous urine culture pan sensitive. Will have her stop metolazone and get repeat chemistry with cardiology in a week.    Wandra Arthurs, MD 04/04/16 [id number]  Wandra Arthurs, MD 04/05/16 [phone]

## 2016-04-04 NOTE — ED Notes (Signed)
Pt. Coming from home via [facility] for SOB. EMS reports pt. Lung sounds clear in all fields, oxygen saturation 100%, and in no obvious distress. Pt. Atrial paced on 12 lead. Pt. Wears compression hose for edema and was recently started on new fluid pill and pacemaker settings changed.

## 2016-04-04 NOTE — Discharge Instructions (Signed)
Stop taking metolazone that was prescribed by your cardiologist last week.   Recheck kidney function with your doctor or cardiologist next week.   Take keflex three times daily for a week.   See cardiology in a week   Continue compression stockings.   Return to ER if you have worse shortness of breath, leg swelling, chest pain, abdominal pain, vomiting, fever

## 2016-04-07 LAB — URINE CULTURE: Culture: >=100000 — AB

## 2016-04-08 ENCOUNTER — Telehealth (HOSPITAL_BASED_OUTPATIENT_CLINIC_OR_DEPARTMENT_OTHER): Payer: Self-pay | Admitting: Emergency Medicine

## 2016-04-08 DIAGNOSIS — L97521 Non-pressure chronic ulcer of other part of left foot limited to breakdown of skin: Secondary | ICD-10-CM | POA: Diagnosis not present

## 2016-04-08 DIAGNOSIS — E1142 Type 2 diabetes mellitus with diabetic polyneuropathy: Secondary | ICD-10-CM | POA: Diagnosis not present

## 2016-04-08 NOTE — Telephone Encounter (Signed)
Post ED Visit - Positive Culture Follow-up  Culture report reviewed by antimicrobial stewardship pharmacist:  []  Elenor Quinones, Pharm.D. []  Heide Guile, Pharm.D., BCPS [x]  Parks Neptune, Pharm.D. []  Alycia Rossetti, Pharm.D., BCPS []  [place], Pharm.D., BCPS, AAHIVP []  Legrand Como, Pharm.D., BCPS, AAHIVP []  Milus Glazier, Pharm.D. []  Stephens November, Pharm.D.  Positive urine culture Treated with cephalexin and nitrofurantoin, organism sensitive to the same and no further patient follow-up is required at this time.  Hazle Nordmann 04/08/2016, 10:00 AM

## 2016-04-10 NOTE — Progress Notes (Signed)
Cardiology Office Note:    Date:  04/11/2016   ID:  [name], DOB 06-03-1931, MRN [id number]  PCP:  [name] Koch, MD  Cardiologist: Dr. Cleatis [name] >> Dr. Ena [name] Electrophysiologist: Dr. Thompson [name]  Referring MD: [name] [name], *   Chief Complaint  Patient presents with  . Congestive Heart Failure    follow up  . Hospitalization Follow-up    seen in ED 04/04/16    History of Present Illness:     [name] [name] is a 82 y.o. female with a hx of PAF, tachybradycardia syndrome status post PPM, diastolic HF, carotid stenosis, prior DVT/pulmonary embolism, HTN, DM2, breast CA. Patient has done well on low-dose flecainide. Dr. Ron [name] recommended that she be on flecainide 50 mg twice a day in the past. She has been on Coumadin for anticoagulation.   Patient was last seen by Dr. Meda [name] 03/28/16. The patient complained of worsening LE edema and dyspnea with exertion. Metolazone was added to her medical regimen. She then went to the emergency room 04/04/16 with complaints of dyspnea. Cardiac enzymes were negative. Her creatinine was worse at 2.2. She was treated for UTI. She was asked to stop her metolazone.    She returns for follow-up. She is here today with her daughter. She states that she does not feel well at all. She denies chest discomfort. She continues to note dyspnea with minimal exertion. Her weight has come down and her lower extremity edema is resolved. She denies orthopnea or PND. She denies syncope. She is somewhat nauseated. She denies any vomiting or dysuria. She denies any bleeding issues. She was given Keflex by the ER physician for her UTI. She had side effects to this and discontinued it. She increased her dose of Macrobid.  Past Medical History  Diagnosis Date  . Hypertension   . Diabetes mellitus   . Tachy-brady syndrome ([place])     s/p pacer 07/2009  . Atrial fibrillation (HCC)     flecainide; coumadin  . Chronic diastolic heart  failure (HCC)     a. echo 10/10: mild LVH, EF 55-60%, mild AI, mild MR, mild to mod LAE, mild RVE, severe RAE, mod TR, PASP 53  /  b.  Echo 3/14:  Mild LVH, EF 55%, Tr AI, MAC, mild MR, mod LAE, PASP 31, trivial eff. // c. echo 1/17: EF 55-60%, mild AI, MAC, mild MR, moderate BAE, moderate TR  . Pulmonary HTN ([place])     53 mmHg echo, 2010, moderate TR, mild right ventricular enlargement  . Pulmonary embolus [facility]) 2006    2006, with DVT  . Carotid artery disease ([place])     [id number]: RICA [id number], LICA 123456  //  [place] [id number]: RICA 123456; LICA [id number] >> FU 1 year   . Thyroid nodule     non-neoplastic goiter  . Breast cancer ([place])   . S/P laminectomy     June 22, 2011  . Mitral regurgitation     mild, echo, October, 2010  . Mediastinal lymphadenopathy   . Venous insufficiency     Chronic  . Hiatal hernia 2006    EGD  . Acute gastric ulcer without mention of hemorrhage, perforation, or obstruction 2006    EGD  . Esophagitis, unspecified 2006    EGD  . Diverticulosis   . Hx of adenomatous colonic polyps   . Osteopenia   . Diabetic peripheral neuropathy ([place]) 03/09/2015  . History of nuclear stress test     a. Myoview  2/12: EF 69%, no scar or ischemia    Past Surgical History  Procedure Laterality Date  . Breast surgery      Left  . Abdominal hysterectomy    . Back surgery      1973, 2012   . Ankle surgery      Current Medications: Outpatient Prescriptions Prior to Visit  Medication Sig Dispense Refill  . ALPRAZolam (XANAX) 0.25 MG tablet Take 1 tablet (0.25 mg total) by mouth daily as needed for anxiety. 30 tablet 0  . bethanechol (URECHOLINE) 25 MG tablet Take 25 mg by mouth daily.     . furosemide (LASIX) 80 MG tablet Take 1 tablet (80 mg total) by mouth 2 (two) times daily. 180 tablet 3  . gabapentin (NEURONTIN) 300 MG capsule Take 300 mg by mouth 3 (three) times daily.     Marland Kitchen HYDROcodone-acetaminophen (NORCO/VICODIN) 5-325 MG tablet Take 1 tablet by mouth every 6  (six) hours as needed for moderate pain. 30 tablet 0  . metFORMIN (GLUCOPHAGE) 500 MG tablet Take 500 mg by mouth daily.     . metoprolol succinate (TOPROL-XL) 25 MG 24 hr tablet TAKE 1/2 (12.5 MG) TABLET BY MOUTH DAILY 45 tablet 3  . nitrofurantoin, macrocrystal-monohydrate, (MACROBID) 100 MG capsule Take 100 mg by mouth daily.    . potassium chloride 20 MEQ/15ML (10%) solution Take 20 mEq by mouth daily.     Marland Kitchen pyridoxine (B-6) 200 MG tablet Take 200 mg by mouth daily.    . vitamin B-12 (CYANOCOBALAMIN) 500 MCG tablet Take 500 mcg by mouth daily.     Marland Kitchen warfarin (COUMADIN) 5 MG tablet Take as directed by Coumadin Clinic (Patient taking differently: Take 2.5-5 mg by mouth See admin instructions. 2.5 mg daily on Sun/Mon/Tues/Wed/Fri/Sat and 5 mg on Thurs) 30 tablet 3  . flecainide (TAMBOCOR) 100 MG tablet TAKE 1 TABLET BY MOUTH TWICE A DAY 180 tablet 3  . metolazone (ZAROXOLYN) 2.5 MG tablet Take 1 tablet (2.5 mg total) by mouth daily. Take this 30 minutes prior to taking your lasix in the morning 90 tablet 3  . cephALEXin (KEFLEX) 500 MG capsule Take 1 capsule (500 mg total) by mouth 3 (three) times daily. (Patient not taking: Reported on 04/11/2016) 21 capsule 0   No facility-administered medications prior to visit.      Allergies:   Morphine; Morphine and related; and Cephalexin   Social History   Social History  . Marital Status: Widowed    Spouse Name: N/A  . Number of Children: 3  . Years of Education: N/A   Occupational History  . Retired     Social History Main Topics  . Smoking status: Former Smoker -- 4.00 packs/day    Quit date: 10/26/1998  . Smokeless tobacco: Never Used  . Alcohol Use: No  . Drug Use: No  . Sexual Activity: Not Asked   Other Topics Concern  . None   Social History Narrative   Widowed.  Lives in [place] by herself.  Avoids caffeine.  Very active, taking care of her brother who also lives by himself and is in his late 3's.     Family History:  The  patient's family history includes COPD in her father; Heart disease in her mother; Hypertension in her brother, daughter, father, mother, and sister. There is no history of Colon cancer, Heart attack, or Stroke.   ROS:   Please see the history of present illness.    ROS All other systems reviewed and are  negative.   Physical Exam:    VS:  BP 124/72 mmHg  Pulse 62  Ht 6' (1.829 m)  Wt 220 lb 12.8 oz (100.154 kg)  BMI 29.94 kg/m2  SpO2 97%   Physical Exam  Constitutional: She is oriented to person, place, and time. She appears well-developed and well-nourished. No distress.  HENT:  Head: Normocephalic and atraumatic.  Eyes: No scleral icterus.  Neck: No JVD present.  Cardiovascular: Normal rate, regular rhythm and normal heart sounds.  Exam reveals no gallop and no friction rub.   No murmur heard. Pulmonary/Chest: Effort normal. She has no wheezes. She has no rales.  Abdominal: Soft. There is no tenderness.  Musculoskeletal: She exhibits no edema.  Neurological: She is alert and oriented to person, place, and time.  Skin: Skin is warm and dry.  Psychiatric: She has a normal mood and affect.    Wt Readings from Last 3 Encounters:  04/11/16 220 lb 12.8 oz (100.154 kg)  03/28/16 227 lb (102.967 kg)  03/28/16 227 lb (102.967 kg)     Studies/Labs Reviewed:     EKG:  EKG is  ordered today.  The ekg ordered today demonstrates AV paced, HR 61  Recent Labs: 04/04/2016: ALT 14; B Natriuretic Peptide 437.0*; BUN 48*; Creatinine, Ser 2.17*; Hemoglobin 13.0; Platelets 318; Potassium 3.3*; Sodium 133*   Recent Lipid Panel No results found for: CHOL, TRIG, HDL, CHOLHDL, VLDL, LDLCALC, LDLDIRECT  Additional studies/ records that were reviewed today include:   Echo 10/03/15 EF 55-60%, mild AI, MAC, mild MR, moderate BAE, moderate TR  Carotid US [id number] RICA [id number]; LICA [id number] >> FU 1 year  Echo 3/14 Mild LVH, EF 55%, trivial AI, MAC, mild MR, moderate LAE, PASP 31 mmHg, trivial  pericardial effusion  Myoview 2/12 EF 69%, no scar or ischemia   ASSESSMENT:     1. Shortness of breath   2. Pacemaker-Medtronic   3. Chronic diastolic CHF (congestive heart failure) ([place])   4. Acute on chronic kidney failure (HCC)   5. Paroxysmal atrial fibrillation ([place])   6. Bilateral carotid artery disease ([place])   7. Hypertensive heart disease with heart failure ([place])   8. Urinary tract infection without hematuria, site unspecified     PLAN:     In order of problems listed above:  1. Dyspnea - Etiology not entirely clear. She was volume overloaded when last seen. Her creatinine increased with diuresis and she is likely over diuresed now. Her weight is down 7 pounds since last seen. She does not look volume overloaded on exam today. She has not had chest discomfort. However, she is diabetic and is at risk for obstructive coronary artery disease. Her ECG is AV paced. Her daughter notes that she does have a history of shortness of breath when she has recurrent UTIs. X-ray in the emergency room was unremarkable.  -  Proceed with Lexiscan Myoview to rule out ischemic heart disease.  -  Obtain CBC  2. S/p pacemaker - I reviewed her ECG today with Dr. Caryl Comes (EP/DOD). Her device was reprogrammed to DDD to allow for more physiologic AV delays when she was seen in 6/17. Her ECG seems to be appropriate for the current settings.  3. Chronic Diastolic CHF - As noted her creatinine increased with diuresis. I have asked her to stop the metolazone. Continue Lasix for now. Will obtain follow-up labs with BMET, CBC today.  4. AKI on CKD - Unfortunately, the patient did not stop taking metolazone as directed by  the ER physician. I am concerned that she may have worsening renal function contributing to her nausea. She is also on metformin. Obtain follow-up BMET today. I have warned her that if her creatinine is significantly elevated, we may need to send her to the hospital for further  management.  5. PAF - Maintaining NSR. She continues on Coumadin, Flecainide. I again reviewed her chart and her previous cardiologist, Dr. Ron [name], had previously recommended that she maintain a dose of flecainide 50 mg twice a day. This was changed by Dr. Rayann Heman from 100 mg to 50 mg twice a day last year. Somehow, she has ended up on a dose of 100 mg twice a day again. I have again asked her to decrease her dose of flecainide 50 mg twice a day. If her Myoview is abnormal, we will need to stop flecainide and consider amiodarone or Tikosyn.    6. Carotid artery disease - CAD risk equivalent along with DM. Repeat due in 12/17.   7. HTN - Controlled.  8. UTI - She has a history of chronic UTIs. She sees Dr. Gaynelle Arabian for urology. She had side effects to Keflex. She increased her dose of Macrobid. I have asked her to call Dr. Arlyn Leak office for earlier follow-up for management of her recurrent UTI.  Total time spent with patient today 42 minutes. This includes reviewing records, evaluating the patient and coordinating care. Face-to-face time >50%.   Medication Adjustments/Labs and Tests Ordered: Current medicines are reviewed at length with the patient today.  Concerns regarding medicines are outlined above.  Medication changes, Labs and Tests ordered today are outlined in the Patient Instructions noted below. Patient Instructions  Medication Instructions:  1. STOP METOLAZONE  2. DECREASE FLECAINIDE TO 50 MG TWICE DAILY; A NEW RX HAS BEEN SENT IN FOR THE 50 MG TABLETS Labwork: 1. TODAY BMET, CBC W/DIFF Testing/Procedures: Your physician has requested that you have a lexiscan myoview. For further information please visit [email or web address]. Please follow instruction sheet, as given. Follow-Up: PA/NP IN 2 WEEKS SAME DAY DR.NELSON IS IN THE OFFICE PER Kareem Cathey, PAC Any Other Special Instructions Will Be Listed Below (If Applicable). PER Adalyna Godbee, PAC MAKE SURE TO FOLLOW UP WITH YOUR  UROLOGIST ABOUT YOUR UTI.  If you need a refill on your cardiac medications before your next appointment, please call your pharmacy.   Signed, Richardson Dopp, PA-C  04/11/2016 5:09 PM    [facility] [address], [place], [place]  [address] Phone: [phone]; Fax: [phone]

## 2016-04-11 ENCOUNTER — Other Ambulatory Visit: Payer: Medicare HMO

## 2016-04-11 ENCOUNTER — Ambulatory Visit (INDEPENDENT_AMBULATORY_CARE_PROVIDER_SITE_OTHER): Payer: Medicare HMO | Admitting: Physician Assistant

## 2016-04-11 ENCOUNTER — Encounter: Payer: Self-pay | Admitting: Physician Assistant

## 2016-04-11 ENCOUNTER — Ambulatory Visit (INDEPENDENT_AMBULATORY_CARE_PROVIDER_SITE_OTHER): Payer: Medicare HMO | Admitting: *Deleted

## 2016-04-11 VITALS — BP 124/72 | HR 62 | Ht 72.0 in | Wt 220.8 lb

## 2016-04-11 DIAGNOSIS — Z7901 Long term (current) use of anticoagulants: Secondary | ICD-10-CM

## 2016-04-11 DIAGNOSIS — I779 Disorder of arteries and arterioles, unspecified: Secondary | ICD-10-CM

## 2016-04-11 DIAGNOSIS — N189 Chronic kidney disease, unspecified: Secondary | ICD-10-CM

## 2016-04-11 DIAGNOSIS — N179 Acute kidney failure, unspecified: Secondary | ICD-10-CM

## 2016-04-11 DIAGNOSIS — I110 Hypertensive heart disease with heart failure: Secondary | ICD-10-CM

## 2016-04-11 DIAGNOSIS — Z950 Presence of cardiac pacemaker: Secondary | ICD-10-CM

## 2016-04-11 DIAGNOSIS — I480 Paroxysmal atrial fibrillation: Secondary | ICD-10-CM | POA: Diagnosis not present

## 2016-04-11 DIAGNOSIS — I5032 Chronic diastolic (congestive) heart failure: Secondary | ICD-10-CM

## 2016-04-11 DIAGNOSIS — I2699 Other pulmonary embolism without acute cor pulmonale: Secondary | ICD-10-CM | POA: Diagnosis not present

## 2016-04-11 DIAGNOSIS — I4891 Unspecified atrial fibrillation: Secondary | ICD-10-CM | POA: Diagnosis not present

## 2016-04-11 DIAGNOSIS — R0602 Shortness of breath: Secondary | ICD-10-CM | POA: Diagnosis not present

## 2016-04-11 DIAGNOSIS — N390 Urinary tract infection, site not specified: Secondary | ICD-10-CM

## 2016-04-11 DIAGNOSIS — I739 Peripheral vascular disease, unspecified: Secondary | ICD-10-CM

## 2016-04-11 LAB — CBC WITH DIFFERENTIAL/PLATELET
BASOS ABS: 90 {cells}/uL (ref 0–200)
BASOS PCT: 2 %
EOS ABS: 90 {cells}/uL (ref 15–500)
Eosinophils Relative: 2 %
HEMATOCRIT: 37.6 % (ref 35.0–45.0)
HEMOGLOBIN: 13 g/dL (ref 11.7–15.5)
LYMPHS ABS: 990 {cells}/uL (ref 850–3900)
Lymphocytes Relative: 22 %
MCH: 31.3 pg (ref 27.0–33.0)
MCHC: 34.6 g/dL (ref 32.0–36.0)
MCV: 90.4 fL (ref 80.0–100.0)
MONO ABS: 360 {cells}/uL (ref 200–950)
MONOS PCT: 8 %
MPV: 9.3 fL (ref 7.5–12.5)
NEUTROS ABS: 2970 {cells}/uL (ref 1500–7800)
Neutrophils Relative %: 66 %
Platelets: 311 10*3/uL (ref 140–400)
RBC: 4.16 MIL/uL (ref 3.80–5.10)
RDW: 14.1 % (ref 11.0–15.0)
WBC: 4.5 10*3/uL (ref 3.8–10.8)

## 2016-04-11 LAB — POCT INR: INR: 2.1

## 2016-04-11 MED ORDER — FLECAINIDE ACETATE 50 MG PO TABS: 50.0000 mg | ORAL_TABLET | Freq: Two times a day (BID) | ORAL | Status: DC

## 2016-04-11 NOTE — Patient Instructions (Addendum)
Medication Instructions:  1. STOP METOLAZONE  2. DECREASE FLECAINIDE TO 50 MG TWICE DAILY; A NEW RX HAS BEEN SENT IN FOR THE 50 MG TABLETS Labwork: 1. TODAY BMET, CBC W/DIFF Testing/Procedures: Your physician has requested that you have a lexiscan myoview. For further information please visit [email or web address]. Please follow instruction sheet, as given. Follow-Up: PA/NP IN 2 WEEKS SAME DAY DR.NELSON IS IN THE OFFICE PER SCOTT WEAVER, PAC Any Other Special Instructions Will Be Listed Below (If Applicable). PER SCOTT WEAVER, PAC MAKE SURE TO FOLLOW UP WITH YOUR UROLOGIST ABOUT YOUR UTI.  If you need a refill on your cardiac medications before your next appointment, please call your pharmacy.

## 2016-04-12 ENCOUNTER — Emergency Department (HOSPITAL_COMMUNITY): Payer: Medicare HMO

## 2016-04-12 ENCOUNTER — Telehealth: Payer: Self-pay | Admitting: *Deleted

## 2016-04-12 ENCOUNTER — Encounter (HOSPITAL_COMMUNITY): Payer: Self-pay | Admitting: Emergency Medicine

## 2016-04-12 ENCOUNTER — Inpatient Hospital Stay (HOSPITAL_COMMUNITY)
Admission: EM | Admit: 2016-04-12 | Discharge: 2016-04-16 | DRG: 917 | Disposition: A | Discharge status: Home / Self Care | Payer: Medicare HMO | Attending: Internal Medicine | Admitting: Internal Medicine

## 2016-04-12 DIAGNOSIS — R040 Epistaxis: Secondary | ICD-10-CM | POA: Diagnosis not present

## 2016-04-12 DIAGNOSIS — E860 Dehydration: Secondary | ICD-10-CM | POA: Diagnosis present

## 2016-04-12 DIAGNOSIS — R627 Adult failure to thrive: Secondary | ICD-10-CM | POA: Diagnosis present

## 2016-04-12 DIAGNOSIS — Z86711 Personal history of pulmonary embolism: Secondary | ICD-10-CM | POA: Diagnosis not present

## 2016-04-12 DIAGNOSIS — I872 Venous insufficiency (chronic) (peripheral): Secondary | ICD-10-CM | POA: Diagnosis present

## 2016-04-12 DIAGNOSIS — N189 Chronic kidney disease, unspecified: Secondary | ICD-10-CM | POA: Diagnosis not present

## 2016-04-12 DIAGNOSIS — I272 Other secondary pulmonary hypertension: Secondary | ICD-10-CM | POA: Diagnosis not present

## 2016-04-12 DIAGNOSIS — E1142 Type 2 diabetes mellitus with diabetic polyneuropathy: Secondary | ICD-10-CM | POA: Diagnosis present

## 2016-04-12 DIAGNOSIS — R0602 Shortness of breath: Secondary | ICD-10-CM | POA: Diagnosis not present

## 2016-04-12 DIAGNOSIS — N183 Chronic kidney disease, stage 3 unspecified: Secondary | ICD-10-CM | POA: Diagnosis present

## 2016-04-12 DIAGNOSIS — Z7984 Long term (current) use of oral hypoglycemic drugs: Secondary | ICD-10-CM

## 2016-04-12 DIAGNOSIS — Z950 Presence of cardiac pacemaker: Secondary | ICD-10-CM

## 2016-04-12 DIAGNOSIS — I5032 Chronic diastolic (congestive) heart failure: Secondary | ICD-10-CM

## 2016-04-12 DIAGNOSIS — Z7901 Long term (current) use of anticoagulants: Secondary | ICD-10-CM

## 2016-04-12 DIAGNOSIS — I482 Chronic atrial fibrillation: Secondary | ICD-10-CM | POA: Diagnosis not present

## 2016-04-12 DIAGNOSIS — J80 Acute respiratory distress syndrome: Secondary | ICD-10-CM | POA: Diagnosis not present

## 2016-04-12 DIAGNOSIS — T462X1A Poisoning by other antidysrhythmic drugs, accidental (unintentional), initial encounter: Principal | ICD-10-CM | POA: Diagnosis present

## 2016-04-12 DIAGNOSIS — Z8711 Personal history of peptic ulcer disease: Secondary | ICD-10-CM

## 2016-04-12 DIAGNOSIS — N289 Disorder of kidney and ureter, unspecified: Secondary | ICD-10-CM

## 2016-04-12 DIAGNOSIS — R0989 Other specified symptoms and signs involving the circulatory and respiratory systems: Secondary | ICD-10-CM | POA: Diagnosis present

## 2016-04-12 DIAGNOSIS — I495 Sick sinus syndrome: Secondary | ICD-10-CM | POA: Diagnosis present

## 2016-04-12 DIAGNOSIS — Z881 Allergy status to other antibiotic agents status: Secondary | ICD-10-CM

## 2016-04-12 DIAGNOSIS — E876 Hypokalemia: Secondary | ICD-10-CM | POA: Diagnosis present

## 2016-04-12 DIAGNOSIS — I340 Nonrheumatic mitral (valve) insufficiency: Secondary | ICD-10-CM | POA: Diagnosis present

## 2016-04-12 DIAGNOSIS — N179 Acute kidney failure, unspecified: Secondary | ICD-10-CM | POA: Diagnosis not present

## 2016-04-12 DIAGNOSIS — Z79899 Other long term (current) drug therapy: Secondary | ICD-10-CM

## 2016-04-12 DIAGNOSIS — I480 Paroxysmal atrial fibrillation: Secondary | ICD-10-CM | POA: Diagnosis not present

## 2016-04-12 DIAGNOSIS — Z825 Family history of asthma and other chronic lower respiratory diseases: Secondary | ICD-10-CM

## 2016-04-12 DIAGNOSIS — E1122 Type 2 diabetes mellitus with diabetic chronic kidney disease: Secondary | ICD-10-CM | POA: Diagnosis not present

## 2016-04-12 DIAGNOSIS — Z86010 Personal history of colonic polyps: Secondary | ICD-10-CM | POA: Diagnosis not present

## 2016-04-12 DIAGNOSIS — I130 Hypertensive heart and chronic kidney disease with heart failure and stage 1 through stage 4 chronic kidney disease, or unspecified chronic kidney disease: Secondary | ICD-10-CM | POA: Diagnosis not present

## 2016-04-12 DIAGNOSIS — I509 Heart failure, unspecified: Secondary | ICD-10-CM | POA: Diagnosis not present

## 2016-04-12 DIAGNOSIS — J439 Emphysema, unspecified: Secondary | ICD-10-CM | POA: Diagnosis not present

## 2016-04-12 DIAGNOSIS — Z87891 Personal history of nicotine dependence: Secondary | ICD-10-CM

## 2016-04-12 DIAGNOSIS — Z86718 Personal history of other venous thrombosis and embolism: Secondary | ICD-10-CM | POA: Diagnosis not present

## 2016-04-12 DIAGNOSIS — M8580 Other specified disorders of bone density and structure, unspecified site: Secondary | ICD-10-CM | POA: Diagnosis present

## 2016-04-12 DIAGNOSIS — E119 Type 2 diabetes mellitus without complications: Secondary | ICD-10-CM

## 2016-04-12 DIAGNOSIS — R0609 Other forms of dyspnea: Secondary | ICD-10-CM | POA: Diagnosis present

## 2016-04-12 DIAGNOSIS — Z885 Allergy status to narcotic agent status: Secondary | ICD-10-CM

## 2016-04-12 DIAGNOSIS — Z853 Personal history of malignant neoplasm of breast: Secondary | ICD-10-CM | POA: Diagnosis not present

## 2016-04-12 DIAGNOSIS — I4891 Unspecified atrial fibrillation: Secondary | ICD-10-CM | POA: Diagnosis present

## 2016-04-12 DIAGNOSIS — Z9012 Acquired absence of left breast and nipple: Secondary | ICD-10-CM

## 2016-04-12 DIAGNOSIS — R9430 Abnormal result of cardiovascular function study, unspecified: Secondary | ICD-10-CM | POA: Diagnosis present

## 2016-04-12 DIAGNOSIS — Z8249 Family history of ischemic heart disease and other diseases of the circulatory system: Secondary | ICD-10-CM

## 2016-04-12 DIAGNOSIS — I10 Essential (primary) hypertension: Secondary | ICD-10-CM

## 2016-04-12 DIAGNOSIS — I5033 Acute on chronic diastolic (congestive) heart failure: Secondary | ICD-10-CM | POA: Diagnosis not present

## 2016-04-12 LAB — URINALYSIS, ROUTINE W REFLEX MICROSCOPIC
Bilirubin Urine: NEGATIVE
GLUCOSE, UA: NEGATIVE mg/dL
Hgb urine dipstick: NEGATIVE
Ketones, ur: NEGATIVE mg/dL
Nitrite: NEGATIVE
PH: 6 (ref 5.0–8.0)
Protein, ur: NEGATIVE mg/dL
Specific Gravity, Urine: 1.008 (ref 1.005–1.030)

## 2016-04-12 LAB — CBC WITH DIFFERENTIAL/PLATELET
BASOS ABS: 0 10*3/uL (ref 0.0–0.1)
BASOS PCT: 1 %
EOS PCT: 2 %
Eosinophils Absolute: 0.1 10*3/uL (ref 0.0–0.7)
HCT: 39.3 % (ref 36.0–46.0)
Hemoglobin: 13.5 g/dL (ref 12.0–15.0)
LYMPHS ABS: 0.7 10*3/uL (ref 0.7–4.0)
LYMPHS PCT: 19 %
MCH: 31.3 pg (ref 26.0–34.0)
MCHC: 34.4 g/dL (ref 30.0–36.0)
MCV: 91 fL (ref 78.0–100.0)
MONOS PCT: 14 %
Monocytes Absolute: 0.5 10*3/uL (ref 0.1–1.0)
Neutro Abs: 2.5 10*3/uL (ref 1.7–7.7)
Neutrophils Relative %: 64 %
PLATELETS: 301 10*3/uL (ref 150–400)
RBC: 4.32 MIL/uL (ref 3.87–5.11)
RDW: 13.8 % (ref 11.5–15.5)
WBC: 3.8 10*3/uL — AB (ref 4.0–10.5)

## 2016-04-12 LAB — TROPONIN I: TROPONIN I: 0.04 ng/mL — AB (ref <0.03)

## 2016-04-12 LAB — URINE MICROSCOPIC-ADD ON

## 2016-04-12 LAB — COMPREHENSIVE METABOLIC PANEL
ALBUMIN: 4 g/dL (ref 3.5–5.0)
ALT: 15 U/L (ref 14–54)
AST: 27 U/L (ref 15–41)
Alkaline Phosphatase: 93 U/L (ref 38–126)
Anion gap: 14 (ref 5–15)
BUN: 66 mg/dL — AB (ref 6–20)
CHLORIDE: 88 mmol/L — AB (ref 101–111)
CO2: 32 mmol/L (ref 22–32)
Calcium: 9.4 mg/dL (ref 8.9–10.3)
Creatinine, Ser: 2.67 mg/dL — ABNORMAL HIGH (ref 0.44–1.00)
GFR calc Af Amer: 18 mL/min — ABNORMAL LOW (ref >60)
GFR, EST NON AFRICAN AMERICAN: 15 mL/min — AB (ref >60)
GLUCOSE: 174 mg/dL — AB (ref 65–99)
POTASSIUM: 2.9 mmol/L — AB (ref 3.5–5.1)
SODIUM: 134 mmol/L — AB (ref 135–145)
Total Bilirubin: 0.9 mg/dL (ref 0.3–1.2)
Total Protein: 8.3 g/dL — ABNORMAL HIGH (ref 6.5–8.1)

## 2016-04-12 LAB — BASIC METABOLIC PANEL
BUN: 66 mg/dL — ABNORMAL HIGH (ref 7–25)
CHLORIDE: 91 mmol/L — AB (ref 98–110)
CO2: 28 mmol/L (ref 20–31)
CREATININE: 2.86 mg/dL — AB (ref 0.60–0.88)
Calcium: 9.3 mg/dL (ref 8.6–10.4)
Glucose, Bld: 122 mg/dL — ABNORMAL HIGH (ref 65–99)
POTASSIUM: 3.4 mmol/L — AB (ref 3.5–5.3)
Sodium: 137 mmol/L (ref 135–146)

## 2016-04-12 LAB — GLUCOSE, CAPILLARY: Glucose-Capillary: 96 mg/dL (ref 65–99)

## 2016-04-12 LAB — PROTIME-INR
INR: 2.2 — AB (ref 0.00–1.49)
PROTHROMBIN TIME: 23.5 s — AB (ref 11.6–15.2)

## 2016-04-12 LAB — BRAIN NATRIURETIC PEPTIDE: B Natriuretic Peptide: 658.5 pg/mL — ABNORMAL HIGH (ref 0.0–100.0)

## 2016-04-12 MED ORDER — VITAMIN B-6 100 MG PO TABS
200.0000 mg | ORAL_TABLET | Freq: Every day | ORAL | Status: DC
  Administered 2016-04-13 – 2016-04-16 (×4): 200 mg via ORAL
  Filled 2016-04-12 (×4): qty 2

## 2016-04-12 MED ORDER — SODIUM CHLORIDE 0.9% FLUSH
3.0000 mL | Freq: Two times a day (BID) | INTRAVENOUS | Status: DC
  Administered 2016-04-12 – 2016-04-16 (×8): 3 mL via INTRAVENOUS

## 2016-04-12 MED ORDER — WARFARIN - PHARMACIST DOSING INPATIENT: Freq: Every day | Status: DC

## 2016-04-12 MED ORDER — POTASSIUM CHLORIDE CRYS ER 20 MEQ PO TBCR
40.0000 meq | EXTENDED_RELEASE_TABLET | Freq: Once | ORAL | Status: AC
  Administered 2016-04-12: 40 meq via ORAL
  Filled 2016-04-12: qty 2

## 2016-04-12 MED ORDER — SENNOSIDES-DOCUSATE SODIUM 8.6-50 MG PO TABS
1.0000 | ORAL_TABLET | Freq: Two times a day (BID) | ORAL | Status: DC
  Administered 2016-04-12 – 2016-04-16 (×7): 1 via ORAL
  Filled 2016-04-12 (×8): qty 1

## 2016-04-12 MED ORDER — METOPROLOL SUCCINATE ER 25 MG PO TB24
12.5000 mg | ORAL_TABLET | Freq: Every day | ORAL | Status: DC
  Administered 2016-04-13 – 2016-04-16 (×4): 12.5 mg via ORAL
  Filled 2016-04-12 (×4): qty 1

## 2016-04-12 MED ORDER — FUROSEMIDE 10 MG/ML IJ SOLN
60.0000 mg | Freq: Once | INTRAMUSCULAR | Status: DC
  Filled 2016-04-12: qty 8

## 2016-04-12 MED ORDER — CYANOCOBALAMIN 500 MCG PO TABS
500.0000 ug | ORAL_TABLET | Freq: Every day | ORAL | Status: DC
  Administered 2016-04-13 – 2016-04-16 (×4): 500 ug via ORAL
  Filled 2016-04-12 (×4): qty 1

## 2016-04-12 MED ORDER — GABAPENTIN 300 MG PO CAPS
300.0000 mg | ORAL_CAPSULE | Freq: Every day | ORAL | Status: DC
  Administered 2016-04-12 – 2016-04-15 (×4): 300 mg via ORAL
  Filled 2016-04-12 (×4): qty 1

## 2016-04-12 MED ORDER — INSULIN ASPART 100 UNIT/ML ~~LOC~~ SOLN
0.0000 [IU] | Freq: Three times a day (TID) | SUBCUTANEOUS | Status: DC
  Administered 2016-04-13: 1 [IU] via SUBCUTANEOUS
  Administered 2016-04-13: 2 [IU] via SUBCUTANEOUS
  Administered 2016-04-13: 1 [IU] via SUBCUTANEOUS
  Administered 2016-04-14: 2 [IU] via SUBCUTANEOUS
  Administered 2016-04-14 – 2016-04-15 (×3): 1 [IU] via SUBCUTANEOUS
  Administered 2016-04-15: 3 [IU] via SUBCUTANEOUS
  Administered 2016-04-16: 2 [IU] via SUBCUTANEOUS

## 2016-04-12 MED ORDER — ALPRAZOLAM 0.25 MG PO TABS
0.2500 mg | ORAL_TABLET | Freq: Every day | ORAL | Status: DC | PRN
  Administered 2016-04-16: 0.25 mg via ORAL
  Filled 2016-04-12: qty 1

## 2016-04-12 NOTE — ED Notes (Signed)
Pt can go to floor at 18:20.

## 2016-04-12 NOTE — ED Notes (Signed)
Family states that pt's PCP had intentions of taking pt off lasix for the next three days based upon blood work from yesterday.  Holding off Lasix until there is clarification from Dr. Ralene Bathe. Dr. Ralene Bathe made aware.

## 2016-04-12 NOTE — ED Provider Notes (Signed)
CSN: [id number]     Arrival date & time 04/12/16  1048 History   First MD Initiated Contact with Patient 04/12/16 1116     Chief Complaint  Patient presents with  . Shortness of Breath     Patient is a 82 y.o. female presenting with shortness of breath. The history is provided by the patient. No language interpreter was used.  Shortness of Breath  [name] [name] is a 82 y.o. female who presents to the Emergency Department complaining of SOB. Patient presents from home for increased shortness of breath. She states she had a doctor's appointment yesterday and everything was okay. She cannot state when her shortness of breath began. She denies any chest pain abdominal pain, leg swelling or pain. She denies any cough, fevers. She states she is tired. She lives alone and is not sure who called 911.  Level V caveat due to confusion.  Past Medical History  Diagnosis Date  . Hypertension   . Diabetes mellitus   . Tachy-brady syndrome ([place])     s/p pacer 07/2009  . Atrial fibrillation (HCC)     flecainide; coumadin  . Chronic diastolic heart failure (HCC)     a. echo 10/10: mild LVH, EF 55-60%, mild AI, mild MR, mild to mod LAE, mild RVE, severe RAE, mod TR, PASP 53  /  b.  Echo 3/14:  Mild LVH, EF 55%, Tr AI, MAC, mild MR, mod LAE, PASP 31, trivial eff. // c. echo 1/17: EF 55-60%, mild AI, MAC, mild MR, moderate BAE, moderate TR  . Pulmonary HTN ([place])     53 mmHg echo, 2010, moderate TR, mild right ventricular enlargement  . Pulmonary embolus [facility]) 2006    2006, with DVT  . Carotid artery disease ([place])     [id number]: RICA [id number], LICA 123456  //  Carotiud [place] [id number]: RICA 123456; LICA [id number] >> FU 1 year   . Thyroid nodule     non-neoplastic goiter  . Breast cancer ([place])   . S/P laminectomy     June 22, 2011  . Mitral regurgitation     mild, echo, October, 2010  . Mediastinal lymphadenopathy   . Venous insufficiency     Chronic  . Hiatal hernia 2006    EGD  . Acute gastric ulcer  without mention of hemorrhage, perforation, or obstruction 2006    EGD  . Esophagitis, unspecified 2006    EGD  . Diverticulosis   . Hx of adenomatous colonic polyps   . Osteopenia   . Diabetic peripheral neuropathy ([place]) 03/09/2015  . History of nuclear stress test     a. Myoview 2/12: EF 69%, no scar or ischemia   Past Surgical History  Procedure Laterality Date  . Breast surgery      Left  . Abdominal hysterectomy    . Back surgery      1973, 2012   . Ankle surgery     Family History  Problem Relation Age of Onset  . Heart disease Mother   . COPD Father   . Colon cancer Neg Hx   . Heart attack Neg Hx   . Hypertension Mother   . Hypertension Father   . Hypertension Sister   . Hypertension Brother   . Hypertension Daughter   . Stroke Neg Hx    Social History  Substance Use Topics  . Smoking status: Former Smoker -- 4.00 packs/day    Quit date: 10/26/1998  . Smokeless tobacco: Never Used  . Alcohol  Use: No   OB History    No data available     Review of Systems  Unable to perform ROS: Mental status change  Respiratory: Positive for shortness of breath.       Allergies  Morphine; Morphine and related; and Cephalexin  Home Medications   Prior to Admission medications   Medication Sig Start Date End Date Taking? Authorizing Provider  ALPRAZolam (XANAX) 0.25 MG tablet Take 1 tablet (0.25 mg total) by mouth daily as needed for anxiety. 10/20/15   Hoyt Koch, MD  bethanechol (URECHOLINE) 25 MG tablet Take 25 mg by mouth daily.     Historical Provider, MD  flecainide (TAMBOCOR) 50 MG tablet Take 1 tablet (50 mg total) by mouth 2 (two) times daily. 04/11/16   Liliane Shi, PA-C  furosemide (LASIX) 80 MG tablet Take 1 tablet (80 mg total) by mouth 2 (two) times daily. 09/27/15   Liliane Shi, PA-C  gabapentin (NEURONTIN) 300 MG capsule Take 300 mg by mouth 3 (three) times daily.     Historical Provider, MD  HYDROcodone-acetaminophen (NORCO/VICODIN)  5-325 MG tablet Take 1 tablet by mouth every 6 (six) hours as needed for moderate pain. 10/20/15   Hoyt Koch, MD  metFORMIN (GLUCOPHAGE) 500 MG tablet Take 500 mg by mouth daily.     Historical Provider, MD  metoprolol succinate (TOPROL-XL) 25 MG 24 hr tablet TAKE 1/2 (12.5 MG) TABLET BY MOUTH DAILY 12/08/15   Dorothy Spark, MD  nitrofurantoin (MACRODANTIN) 100 MG capsule Take 100 mg by mouth 2 (two) times daily.    Historical Provider, MD  nitrofurantoin, macrocrystal-monohydrate, (MACROBID) 100 MG capsule Take 100 mg by mouth daily.    Historical Provider, MD  potassium chloride 20 MEQ/15ML (10%) solution Take 20 mEq by mouth daily.     Historical Provider, MD  pyridoxine (B-6) 200 MG tablet Take 200 mg by mouth daily.    Historical Provider, MD  vitamin B-12 (CYANOCOBALAMIN) 500 MCG tablet Take 500 mcg by mouth daily.     Historical Provider, MD  warfarin (COUMADIN) 5 MG tablet Take as directed by [facility] Patient taking differently: Take 2.5-5 mg by mouth See admin instructions. 2.5 mg daily on Sun/Mon/Tues/Wed/Fri/Sat and 5 mg on Thurs 01/01/16   Dorothy Spark, MD   BP 110/53 mmHg  Pulse 60  Temp(Src) 97.7 F (36.5 C) (Oral)  Resp 18  SpO2 98% Physical Exam  Constitutional: She appears well-developed and well-nourished.  HENT:  Head: Normocephalic and atraumatic.  Cardiovascular: Normal rate and regular rhythm.   No murmur heard. Pulmonary/Chest: Effort normal. No respiratory distress.  Decreased breath sounds bilaterally  Abdominal: Soft. There is no tenderness. There is no rebound and no guarding.  Musculoskeletal: She exhibits no tenderness.  Nonpitting edema bilateral lower extremities  Neurological:  Drowsy with generalized weakness, mildly confused  Skin: Skin is warm and dry.  Psychiatric: She has a normal mood and affect. Her behavior is normal.  Nursing note and vitals reviewed.   ED Course  Procedures (including critical care time) Labs  Review Labs Reviewed  COMPREHENSIVE METABOLIC PANEL - Abnormal; Notable for the following:    Sodium 134 (*)    Potassium 2.9 (*)    Chloride 88 (*)    Glucose, Bld 174 (*)    BUN 66 (*)    Creatinine, Ser 2.67 (*)    Total Protein 8.3 (*)    GFR calc non Af Amer 15 (*)    GFR calc  Af Amer 18 (*)    All other components within normal limits  CBC WITH DIFFERENTIAL/PLATELET - Abnormal; Notable for the following:    WBC 3.8 (*)    All other components within normal limits  PROTIME-INR - Abnormal; Notable for the following:    Prothrombin Time 23.5 (*)    INR 2.20 (*)    All other components within normal limits  TROPONIN I - Abnormal; Notable for the following:    Troponin I 0.04 (*)    All other components within normal limits  BRAIN NATRIURETIC PEPTIDE - Abnormal; Notable for the following:    B Natriuretic Peptide 658.5 (*)    All other components within normal limits  URINALYSIS, ROUTINE W REFLEX MICROSCOPIC (NOT AT [facility]) - Abnormal; Notable for the following:    APPearance CLOUDY (*)    Leukocytes, UA TRACE (*)    All other components within normal limits  URINE MICROSCOPIC-ADD ON - Abnormal; Notable for the following:    Squamous Epithelial / LPF 6-30 (*)    Bacteria, UA FEW (*)    All other components within normal limits  TSH  PROTIME-INR    Imaging Review No results found. I have personally reviewed and evaluated these images and lab results as part of my medical decision-making.   EKG Interpretation None      MDM   Final diagnoses:  None    Patient with history of congestive heart failure here with worsening shortness of breath. She is confused as and is able to only give a limited history on examination and lives alone. Given her dyspnea plan to admit for further workup. Concern for her safety at home given her confusion. Hospitalist consulted for admission.  Quintella Reichert, MD 04/12/16 1758

## 2016-04-12 NOTE — ED Notes (Signed)
Cardiologist at bedside.  

## 2016-04-12 NOTE — Telephone Encounter (Signed)
Lmtcb to go over lab results and recommendations . I then s/w DPR daughter [name] who stated pt is in [facility] because she could not breath earlier this morning. I did also let daughter know to make sure to let dr know labs done yesterday and that there were med changes. Daughter said thank you for our help.

## 2016-04-12 NOTE — ED Notes (Signed)
Unable to collect labs patient is in xray 

## 2016-04-12 NOTE — H&P (Signed)
History and Physical  [name] [name] [id number] DOB: March 02, 1931 DOA: 04/12/2016  Referring physician: EDP PCP: Hoyt Koch, MD   Chief Complaint: progressive sob, weakness for the last few weeks  HPI: [name] [name] is a 82 y.o. female   With remote history of breast cancer s/p  Left mastectomy, h/o PE, afib/sick sinus syndrome s/p pacemaker and on coumadin, chronic diastolic chf, ckd III, noninsulin dependent dm2, peripheral neuropathy is brought to the ED by EMS due to c/o sob, patient is not a reliable historian hpi obtained from chart review, talking to [place] and patient's family. Per family patient has been having progressive decline with c/o feeling drained, progressive DOE for the last few week, family reported she was seen in the ED last Thursday due to sob, found to have UTI, she was given keflex but she developed vomiting and diarrhea from it, these symptoms resolved after she stopped taking keflex, her daughter give her nitrofurantoin since 7/8. She denies fever, no abdominal pain, family report her symptom of UTI is sob and low back pain. family also reports her pacemaker setting was adjusted recently.her heart medicine was adjusted as well, patient was just seen by cardiology yesterday due to sob, she was told that she might be dehydrated, and is instructed to hold lasix for three days.  This morning, she finished making her bed and talked to her caregiver over the phone with significant dyspnea, she called EMS and is brought to [facility] ED.   ED course: vital stable, on room air, cxr ? Pulmonary vascular congestion, labs with hypokalemia, she is given potassium in the ED , she is prescribed lasix in the ED which family refused, hospitalist called to admit the patient due to concern of chf exacerbation, patient currently denies chest pain, no cough, no fever, no edema.   Review of Systems:  Detail per HPI, Review of systems are otherwise negative  Past Medical History    Diagnosis Date  . Hypertension   . Diabetes mellitus   . Tachy-brady syndrome ([place])     s/p pacer 07/2009  . Atrial fibrillation (HCC)     flecainide; coumadin  . Chronic diastolic heart failure (HCC)     a. echo 10/10: mild LVH, EF 55-60%, mild AI, mild MR, mild to mod LAE, mild RVE, severe RAE, mod TR, PASP 53  /  b.  Echo 3/14:  Mild LVH, EF 55%, Tr AI, MAC, mild MR, mod LAE, PASP 31, trivial eff. // c. echo 1/17: EF 55-60%, mild AI, MAC, mild MR, moderate BAE, moderate TR  . Pulmonary HTN ([place])     53 mmHg echo, 2010, moderate TR, mild right ventricular enlargement  . Pulmonary embolus [facility]) 2006    2006, with DVT  . Carotid artery disease ([place])     [id number]: RICA [id number], LICA 123456  //  Car[employer] [place] [id number]: RICA 123456; LICA [id number] >> FU 1 year   . Thyroid nodule     non-neoplastic goiter  . Breast cancer ([place])   . S/P laminectomy     June 22, 2011  . Mitral regurgitation     mild, echo, October, 2010  . Mediastinal lymphadenopathy   . Venous insufficiency     Chronic  . Hiatal hernia 2006    EGD  . Acute gastric ulcer without mention of hemorrhage, perforation, or obstruction 2006    EGD  . Esophagitis, unspecified 2006    EGD  . Diverticulosis   . Hx of adenomatous colonic polyps   .  Osteopenia   . Diabetic peripheral neuropathy ([place]) 03/09/2015  . History of nuclear stress test     a. Myoview 2/12: EF 69%, no scar or ischemia   Past Surgical History  Procedure Laterality Date  . Breast surgery      Left  . Abdominal hysterectomy    . Back surgery      1973, 2012   . Ankle surgery     Social History:  reports that she quit smoking about 17 years ago. She has never used smokeless tobacco. She reports that she does not drink alcohol or use illicit drugs. Patient lives at home alone & is able to participate in activities of daily living independently , she does not drive, her daughter coming in to help her  Allergies  Allergen Reactions  . Morphine And Related  Other (See Comments)    Family requests no morphine due to past reaction. Pt stated it made her sick.  . Cephalexin Nausea And Vomiting    Family History  Problem Relation Age of Onset  . Heart disease Mother   . COPD Father   . Colon cancer Neg Hx   . Heart attack Neg Hx   . Hypertension Mother   . Hypertension Father   . Hypertension Sister   . Hypertension Brother   . Hypertension Daughter   . Stroke Neg Hx       Prior to Admission medications   Medication Sig Start Date End Date Taking? Authorizing Provider  ALPRAZolam (XANAX) 0.25 MG tablet Take 1 tablet (0.25 mg total) by mouth daily as needed for anxiety. 10/20/15   Hoyt Koch, MD  bethanechol (URECHOLINE) 25 MG tablet Take 25 mg by mouth daily.     Historical Provider, MD  flecainide (TAMBOCOR) 50 MG tablet Take 1 tablet (50 mg total) by mouth 2 (two) times daily. 04/11/16   Liliane Shi, PA-C  furosemide (LASIX) 80 MG tablet Take 1 tablet (80 mg total) by mouth 2 (two) times daily. 09/27/15   Liliane Shi, PA-C  gabapentin (NEURONTIN) 300 MG capsule Take 300 mg by mouth 3 (three) times daily.     Historical Provider, MD  HYDROcodone-acetaminophen (NORCO/VICODIN) 5-325 MG tablet Take 1 tablet by mouth every 6 (six) hours as needed for moderate pain. 10/20/15   Hoyt Koch, MD  metFORMIN (GLUCOPHAGE) 500 MG tablet Take 500 mg by mouth daily.     Historical Provider, MD  metoprolol succinate (TOPROL-XL) 25 MG 24 hr tablet TAKE 1/2 (12.5 MG) TABLET BY MOUTH DAILY 12/08/15   Dorothy Spark, MD  nitrofurantoin (MACRODANTIN) 100 MG capsule Take 100 mg by mouth 2 (two) times daily.    Historical Provider, MD  nitrofurantoin, macrocrystal-monohydrate, (MACROBID) 100 MG capsule Take 100 mg by mouth daily.    Historical Provider, MD  potassium chloride 20 MEQ/15ML (10%) solution Take 20 mEq by mouth daily.     Historical Provider, MD  pyridoxine (B-6) 200 MG tablet Take 200 mg by mouth daily.    Historical  Provider, MD  vitamin B-12 (CYANOCOBALAMIN) 500 MCG tablet Take 500 mcg by mouth daily.     Historical Provider, MD  warfarin (COUMADIN) 5 MG tablet Take as directed by [facility] Patient taking differently: Take 2.5-5 mg by mouth See admin instructions. 2.5 mg daily on Sun/Mon/Tues/Wed/Fri/Sat and 5 mg on Thurs 01/01/16   Dorothy Spark, MD    Physical Exam: BP 114/59 mmHg  Pulse 63  Temp(Src) 97.5 F (36.4 C) (Oral)  Resp 12  Ht 6' (1.829 m)  Wt 100 kg (220 lb 7.4 oz)  BMI 29.89 kg/m2  SpO2 93%  General:  * Eyes: PERRL ENT: unremarkable Neck: supple, no JVD Cardiovascular: RRR Respiratory: CTABL Abdomen: soft/ND/ND, positive bowel sounds Skin: no rash Musculoskeletal:  No edema Psychiatric: calm/cooperative Neurologic: no focal findings  *          Labs on Admission:  Basic Metabolic Panel:  Recent Labs Lab 04/11/16 1624 04/12/16 1153  NA 137 134*  K 3.4* 2.9*  CL 91* 88*  CO2 28 32  GLUCOSE 122* 174*  BUN 66* 66*  CREATININE 2.86* 2.67*  CALCIUM 9.3 9.4   Liver Function Tests:  Recent Labs Lab 04/12/16 1153  AST 27  ALT 15  ALKPHOS 93  BILITOT 0.9  PROT 8.3*  ALBUMIN 4.0   No results for input(s): LIPASE, AMYLASE in the last 168 hours. No results for input(s): AMMONIA in the last 168 hours. CBC:  Recent Labs Lab 04/11/16 1624 04/12/16 1153  WBC 4.5 3.8*  NEUTROABS 2970 2.5  HGB 13.0 13.5  HCT 37.6 39.3  MCV 90.4 91.0  PLT 311 301   Cardiac Enzymes:  Recent Labs Lab 04/12/16 1153  TROPONINI 0.04*    BNP (last 3 results)  Recent Labs  09/27/15 1500 04/04/16 1737 04/12/16 1153  BNP 607.5* 437.0* 658.5*    ProBNP (last 3 results) No results for input(s): PROBNP in the last 8760 hours.  CBG: No results for input(s): GLUCAP in the last 168 hours.  Radiological Exams on Admission: Dg Chest 2 View  04/12/2016  CLINICAL DATA:  New shortness of breath this morning, history of CHF, hypertension, diabetes mellitus,  atrial fibrillation, pulmonary hypertension, prior pulmonary embolism, former smoker EXAM: CHEST  2 VIEW COMPARISON:  04/04/2016 FINDINGS: Left subclavian sequential transvenous pacemaker leads project at right atrium and right ventricle. Enlargement of cardiac silhouette with pulmonary vascular congestion. Aortic atherosclerosis. Large hiatal hernia with air-fluid level. RIGHT pleural effusion and basilar atelectasis. Central peribronchial thickening. No definite infiltrate or pneumothorax. Bones demineralized. IMPRESSION: Enlargement of cardiac silhouette with pulmonary vascular congestion post pacemaker. Bronchitic changes with RIGHT basilar atelectasis and small RIGHT pleural effusion. Aortic atherosclerosis. Electronically Signed   By: Lavonia Dana M.D.   On: 04/12/2016 12:00   Ct Chest Wo Contrast  04/12/2016  CLINICAL DATA:  Shortness of breath.  Renal insufficiency. EXAM: CT CHEST WITHOUT CONTRAST TECHNIQUE: Multidetector CT imaging of the chest was performed following the standard protocol without IV contrast. COMPARISON:  Chest x-ray 04/12/2016.  Chest CT 10/14/2011 FINDINGS: Cardiovascular: Heart is enlarged. Coronary artery calcification is noted. Left-sided permanent pacemaker noted. Ascending thoracic aorta measures 4.1 cm in diameter with atherosclerotic calcification in the wall. Mediastinum/Nodes: 10 mm short axis right paratracheal lymph node is stable. 14 mm short axis subcarinal lymph node measured 10 mm previously. No evidence for gross hilar lymphadenopathy although assessment is limited by the lack of intravenous contrast on today's study. There is no axillary lymphadenopathy. 17 mm posterior right thyroid nodule is stable. Lungs/Pleura: Centrilobular emphysema noted bilaterally. No focal airspace consolidation. There is some septal thickening dependently in the lower lobes in the right middle lobe, nonspecific. No suspicious pulmonary nodule or mass. No overt airspace pulmonary edema or  pleural effusion. Upper Abdomen: Moderate to large hiatal hernia noted with approximately 50% of the stomach in the thorax. Musculoskeletal: Bone windows reveal no worrisome lytic or sclerotic osseous lesions. IMPRESSION: 1. No acute findings. 2. Emphysema with mild basilar  peripheral septal thickening, nonspecific. 3. Moderate to large hiatal hernia with 50% or more of the stomach being in the thorax. 4. 4.1 cm ascending aortic diameter. Recommend annual imaging followup by CTA or MRA. This recommendation follows 2010 ACCF/AHA/AATS/ACR/ASA/SCA/SCAI/SIR/STS/SVM Guidelines for the Diagnosis and Management of Patients with Thoracic Aortic Disease. Circulation. 2010; [id number] Electronically Signed   By: Misty Stanley M.D.   On: 04/12/2016 17:06    EKG: Independently reviewed. Paced rhythm   Assessment/Plan Present on Admission:  . Atrial fibrillation ([place]) . Chronic diastolic CHF (congestive heart failure) ([place]) . Hypertension . Ejection fraction-55-60% by echo Jan 2017 . Pacemaker-[employer] . History of pulmonary embolism . CRD (chronic renal disease), stage III . Acute on chronic diastolic (congestive) heart failure ([place]) . Dyspnea . SOB (shortness of breath)   Fatigue/sob/FTT ( presenting symptom): patient does not look in heart failure, she looks rather dry. Presenting symptom could be multifactorial, she reported her pace maker setting was recently adjusted, she look dehydrated, she has hypokalemia, she does not seem to have uti this time. She is on anticoagulation so PE less likely.  Hypokalemia: replace k, check mag  AKI on ckd III: clinically dry, hold diuretics   PAF on coumadin, SSS status post MDT PPM, diastolic HF-EF 0000000 by echo jan 2017, currently look dry, hold lasix, pharmacy to dose coumadin, cardiology consulted who recommended to hold flecainide, further management per cardiology  Remote h/o DVT/PE on coumadin. IN therapeutic  noinsulin dependent diabetes: a1c  pending, hold metformin, start ssi  Peripheral neuropathy: home dose neurontin decreased to 300mg  qhd due to elevated cr   DVT prophylaxis: on coumadin  Consultants: cardiology  Code Status: full   Family Communication:  Patient and family  Disposition Plan: admit to med tele  Time spent: 20mins  Clayburn Weekly MD, PhD [facility] Pager [phone] If 7PM-7AM, please contact night-coverage at [email or web address], password [facility]

## 2016-04-12 NOTE — ED Notes (Signed)
Catheter charted in error under wrong pt

## 2016-04-12 NOTE — Consult Note (Signed)
Reason for Consult:   Dyspnea  Requesting Physician: [facility] Primary Cardiologist Dr Meda Coffee  HPI:   82 y.o. female with a hx of PAF-on Flecainide and Coumadin, SSS status post MDT PPM, diastolic HF-EF 0000000 by echo jan 2017, moderate carotid stenosis, remote DVT/pulmonary embolism, HTN, DM2, and history of breast CA 2014. Patient had done well on low-dose flecainide. Dr. Ron Parker recommended that she be on flecainide 50 mg twice a day in the past. She has been on Coumadin for anticoagulation.   Patient was seen by Dr. Meda Coffee 03/28/16 and complained of worsening LE edema and dyspnea with exertion. Metolazone was added to her medical regimen. She then went to the emergency room 04/04/16 with complaints of dyspnea. Cardiac enzymes were negative. Her creatinine was worse at 2.2 (1.6 in Jan 2017). She was treated for a UTI and was asked to stop her metolazone s/s acute on chronic renal insuficiency.   She returned for follow-up 04/10/16. She stated that she continues to note dyspnea with minimal exertion. Her weight has come down and her lower extremity edema had resolved. Scott did not think she was volume overloaded. There was some confusion about her medications (pt lives alone). She was supposed to have stopped her Metolazone which she hadn't and was supposed to have been on Flecainide 50 mg BID, not 100 mg BID. Scott wanted to get a Myoview and labs. She came to the ED at [facility] today after calling her daughter complaining of dyspnea. In the ED her BNP is 658, CXR confirms vascular congestion, and her SCr is 2.67.   PMHx:  Past Medical History  Diagnosis Date  . Hypertension   . Diabetes mellitus   . Tachy-brady syndrome ([place])     s/p pacer 07/2009  . Atrial fibrillation (HCC)     flecainide; coumadin  . Chronic diastolic heart failure (HCC)     a. echo 10/10: mild LVH, EF 55-60%, mild AI, mild MR, mild to mod LAE, mild RVE, severe RAE, mod TR, PASP 53  /  b.  Echo 3/14:  Mild LVH,  EF 55%, Tr AI, MAC, mild MR, mod LAE, PASP 31, trivial eff. // c. echo 1/17: EF 55-60%, mild AI, MAC, mild MR, moderate BAE, moderate TR  . Pulmonary HTN ([place])     53 mmHg echo, 2010, moderate TR, mild right ventricular enlargement  . Pulmonary embolus [facility]) 2006    2006, with DVT  . Carotid artery disease ([place])     [id number]: RICA [id number], LICA [id number]  //  Carotiud [place] [id number]: RICA [id number]; LICA [id number] >> FU 1 year   . Thyroid nodule     non-neoplastic goiter  . Breast cancer ([place])   . S/P laminectomy     June 22, 2011  . Mitral regurgitation     mild, echo, October, 2010  . Mediastinal lymphadenopathy   . Venous insufficiency     Chronic  . Hiatal hernia 2006    EGD  . Acute gastric ulcer without mention of hemorrhage, perforation, or obstruction 2006    EGD  . Esophagitis, unspecified 2006    EGD  . Diverticulosis   . Hx of adenomatous colonic polyps   . Osteopenia   . Diabetic peripheral neuropathy ([place]) 03/09/2015  . History of nuclear stress test     a. Myoview 2/12: EF 69%, no scar or ischemia    Past Surgical History  Procedure Laterality Date  . Breast surgery  Left  . Abdominal hysterectomy    . Back surgery      1973, 2012   . Ankle surgery      SOCHx:  reports that she quit smoking about 17 years ago. She has never used smokeless tobacco. She reports that she does not drink alcohol or use illicit drugs.Lives in her own home alone  FAMHx: Family History  Problem Relation Age of Onset  . Heart disease Mother   . COPD Father   . Colon cancer Neg Hx   . Heart attack Neg Hx   . Hypertension Mother   . Hypertension Father   . Hypertension Sister   . Hypertension Brother   . Hypertension Daughter   . Stroke Neg Hx     ALLERGIES: Allergies  Allergen Reactions  . Morphine And Related Other (See Comments)    Family requests no morphine due to past reaction. Pt stated it made her sick.  . Cephalexin Nausea And Vomiting    ROS: Review of  Systems: General: negative for chills, fever, night sweats or weight changes.  Cardiovascular: negative for palpitations HEENT: negative for any visual disturbances, blindness, glaucoma Dermatological: negative for rash Respiratory: negative for cough, hemoptysis, or wheezing Urologic: negative for hematuria or dysuria Abdominal: negative for nausea, vomiting, diarrhea, bright red blood per rectum, melena, or hematemesis Neurologic: negative for visual changes, syncope, or dizziness Musculoskeletal: negative for back pain, joint pain, or swelling Psych: cooperative and appropriate All other systems reviewed and are otherwise negative except as noted above.   HOME MEDICATIONS: Prior to Admission medications   Medication Sig Start Date End Date Taking? Authorizing Provider  ALPRAZolam (XANAX) 0.25 MG tablet Take 1 tablet (0.25 mg total) by mouth daily as needed for anxiety. 10/20/15  Yes Hoyt Koch, MD  bethanechol (URECHOLINE) 25 MG tablet Take 25 mg by mouth 2 (two) times daily.    Yes Historical Provider, MD  flecainide (TAMBOCOR) 50 MG tablet Take 1 tablet (50 mg total) by mouth 2 (two) times daily. 04/11/16  Yes Scott T Kathlen Mody, PA-C  furosemide (LASIX) 80 MG tablet Take 1 tablet (80 mg total) by mouth 2 (two) times daily. 09/27/15  Yes Liliane Shi, PA-C  gabapentin (NEURONTIN) 300 MG capsule Take 300 mg by mouth 3 (three) times daily.    Yes Historical Provider, MD  metFORMIN (GLUCOPHAGE) 500 MG tablet Take 500 mg by mouth 2 (two) times daily with a meal.    Yes Historical Provider, MD  metoprolol succinate (TOPROL-XL) 25 MG 24 hr tablet TAKE 1/2 (12.5 MG) TABLET BY MOUTH DAILY 12/08/15  Yes Dorothy Spark, MD  nitrofurantoin, macrocrystal-monohydrate, (MACROBID) 100 MG capsule Take 100 mg by mouth See admin instructions. Two times daily until 04/16/16, and then take medication daily.   Yes Historical Provider, MD  pyridoxine (B-6) 200 MG tablet Take 200 mg by mouth daily.    Yes Historical Provider, MD  vitamin B-12 (CYANOCOBALAMIN) 500 MCG tablet Take 500 mcg by mouth daily.    Yes Historical Provider, MD  warfarin (COUMADIN) 5 MG tablet Take as directed by Coumad[facility] Clinic Patient taking differently: Take 2.5-5 mg by mouth See admin instructions. 2.5 mg daily on Sun/Mon/Tues/Wed/Fri/Sat and 5 mg on Thurs 01/01/16  Yes Dorothy Spark, MD  HYDROcodone-acetaminophen (NORCO/VICODIN) 5-325 MG tablet Take 1 tablet by mouth every 6 (six) hours as needed for moderate pain. Patient not taking: Reported on 04/12/2016 10/20/15   Hoyt Koch, MD    HOSPITAL MEDICATIONS: I have  reviewed the patient's current medications.  VITALS: Blood pressure 106/61, pulse 60, temperature 97.7 F (36.5 C), temperature source Oral, resp. rate 17, SpO2 93 %.  PHYSICAL EXAM: General appearance: alert, cooperative and no distress Neck: no JVD and soft bruit Lungs: decreased Rt > Lt Heart: regular rate and rhythm and 2/6 systolic murmurLSB, apex Abdomen: soft, non-tender; bowel sounds normal; no masses,  no organomegaly Extremities: trace edema Pulses: 2+ and symmetric Skin: cool and dry Neurologic: Grossly normal  LABS: Results for orders placed or performed during the hospital encounter of 04/12/16 (from the past 24 hour(s))  Comprehensive metabolic panel     Status: Abnormal   Collection Time: 04/12/16 11:53 AM  Result Value Ref Range   Sodium 134 (L) 135 - 145 mmol/L   Potassium 2.9 (L) 3.5 - 5.1 mmol/L   Chloride 88 (L) 101 - 111 mmol/L   CO2 32 22 - 32 mmol/L   Glucose, Bld 174 (H) 65 - 99 mg/dL   BUN 66 (H) 6 - 20 mg/dL   Creatinine, Ser 2.67 (H) 0.44 - 1.00 mg/dL   Calcium 9.4 8.9 - 10.3 mg/dL   Total Protein 8.3 (H) 6.5 - 8.1 g/dL   Albumin 4.0 3.5 - 5.0 g/dL   AST 27 15 - 41 U/L   ALT 15 14 - 54 U/L   Alkaline Phosphatase 93 38 - 126 U/L   Total Bilirubin 0.9 0.3 - 1.2 mg/dL   GFR calc non Af Amer 15 (L) >60 mL/min   GFR calc Af Amer 18 (L) >60 mL/min    Anion gap 14 5 - 15  CBC with Differential     Status: Abnormal   Collection Time: 04/12/16 11:53 AM  Result Value Ref Range   WBC 3.8 (L) 4.0 - 10.5 K/uL   RBC 4.32 3.87 - 5.11 MIL/uL   Hemoglobin 13.5 12.0 - 15.0 g/dL   HCT 39.3 36.0 - 46.0 %   MCV 91.0 78.0 - 100.0 fL   MCH 31.3 26.0 - 34.0 pg   MCHC 34.4 30.0 - 36.0 g/dL   RDW 13.8 11.5 - 15.5 %   Platelets 301 150 - 400 K/uL   Neutrophils Relative % 64 %   Neutro Abs 2.5 1.7 - 7.7 K/uL   Lymphocytes Relative 19 %   Lymphs Abs 0.7 0.7 - 4.0 K/uL   Monocytes Relative 14 %   Monocytes Absolute 0.5 0.1 - 1.0 K/uL   Eosinophils Relative 2 %   Eosinophils Absolute 0.1 0.0 - 0.7 K/uL   Basophils Relative 1 %   Basophils Absolute 0.0 0.0 - 0.1 K/uL  Protime-INR     Status: Abnormal   Collection Time: 04/12/16 11:53 AM  Result Value Ref Range   Prothrombin Time 23.5 (H) 11.6 - 15.2 seconds   INR 2.20 (H) 0.00 - 1.49  Troponin I     Status: Abnormal   Collection Time: 04/12/16 11:53 AM  Result Value Ref Range   Troponin I 0.04 (HH) <0.03 ng/mL  Brain natriuretic peptide     Status: Abnormal   Collection Time: 04/12/16 11:53 AM  Result Value Ref Range   B Natriuretic Peptide 658.5 (H) 0.0 - 100.0 pg/mL    EKG: AV Paced  IMAGING: Dg Chest 2 View  04/12/2016  CLINICAL DATA:  New shortness of breath this morning, history of CHF, hypertension, diabetes mellitus, atrial fibrillation, pulmonary hypertension, prior pulmonary embolism, former smoker EXAM: CHEST  2 VIEW COMPARISON:  04/04/2016 FINDINGS: Left subclavian sequential transvenous  pacemaker leads project at right atrium and right ventricle. Enlargement of cardiac silhouette with pulmonary vascular congestion. Aortic atherosclerosis. Large hiatal hernia with air-fluid level. RIGHT pleural effusion and basilar atelectasis. Central peribronchial thickening. No definite infiltrate or pneumothorax. Bones demineralized. IMPRESSION: Enlargement of cardiac silhouette with pulmonary  vascular congestion post pacemaker. Bronchitic changes with RIGHT basilar atelectasis and small RIGHT pleural effusion. Aortic atherosclerosis. Electronically Signed   By: Lavonia Dana M.D.   On: 04/12/2016 12:00    IMPRESSION: Active Problems:   Acute on chronic renal insufficiency (HCC)   Acute on chronic diastolic (congestive) heart failure (HCC)   Diabetes mellitus (HCC)   Atrial fibrillation (HCC)   Chronic diastolic CHF (congestive heart failure) (HCC)   CRD (chronic renal disease), stage III   Chronic anticoagulation-Coumadin   History of pulmonary embolism   History of breast cancer   Ejection fraction-55-60% by echo Jan 2017   Pacemaker-Medtronic   Hypertension   RECOMMENDATION: I think she needs diuresis despite renal insufficiency and that may actually improved with diuresis. Consider IV diuretics, if her renal function worsens ask for nephrology's input. Will discuss with MD.    I am concerned about her home medication compliance, lots of recent mistakes, confusion noted.   Hold Glucophage till renal function stabilizes.   Time Spent Directly with Patient: [address] minutes  Kerin Ransom, [place] beeper 04/12/2016, 3:21 PM   I have seen and examined the patient along with Kerin Ransom, PA.  I have reviewed the chart, notes and new data.  I agree with PA's note.  Key new complaints: worsened functional status due to dyspnea, worse after changes in device settings a month ago; Due to very long PR interval, the programming was changed from AAI<->DDD to DDD to allow for physiological AV delay. This has led to increased V pacing from 12% to 100%. No recent AFib. Flecainide was inadvertently at 100 mg BID Key examination changes: no JVD or dema, no S3, loud 3/6 holosystolic murmur in precordial area (probably TR) Key new findings / data: creatinine and BNP both above baseline. Markedly hypokalemic. CXR with congestion, not with overt edema.  PLAN: I think we should hold  all diuretics while her K is dangerously low. Resume furosemide when K normal. She has evidence of CHF without overt hypervolemia on exam (?reduce LV output due to increased V pacing, ?flecainide toxicity). I would advocate stopping flecainide in the face of CHF and Increased need for V pacing and starting dofetilide or amiodarone if AFib recurs.  Dr. Rayann Heman is her EP and will try to discuss with him, but will hold flecainide for now. Reevaluate TR and RV function by echo. Go ahead with Myoview as planned.  Sanda Klein, MD, [place] [phone] 04/12/2016, 4:33 PM

## 2016-04-12 NOTE — ED Notes (Signed)
With triage pt denies SOB at present time; states "I feel better."

## 2016-04-12 NOTE — Progress Notes (Signed)
ANTICOAGULATION CONSULT NOTE - Initial Consult  Pharmacy Consult for warfarin Indication: atrial fibrillation  Allergies  Allergen Reactions  . Morphine And Related Other (See Comments)    Family requests no morphine due to past reaction. Pt stated it made her sick.  . Cephalexin Nausea And Vomiting    Patient Measurements:     Vital Signs: Temp: 97.7 F (36.5 C) (07/14 1114) Temp Source: Oral (07/14 1114) BP: 106/61 mmHg (07/14 1403) Pulse Rate: 60 (07/14 1403)  Labs:  Recent Labs  04/11/16 1617 04/11/16 1624 04/12/16 1153  HGB  --  13.0 13.5  HCT  --  37.6 39.3  PLT  --  311 301  LABPROT  --   --  23.5*  INR 2.1  --  2.20*  CREATININE  --  2.86* 2.67*  TROPONINI  --   --  0.04*    Estimated Creatinine Clearance: 21.1 mL/min (by C-G formula based on Cr of 2.67).   Medical History: Past Medical History  Diagnosis Date  . Hypertension   . Diabetes mellitus   . Tachy-brady syndrome ([place])     s/p pacer 07/2009  . Atrial fibrillation (HCC)     flecainide; coumadin  . Chronic diastolic heart failure (HCC)     a. echo 10/10: mild LVH, EF 55-60%, mild AI, mild MR, mild to mod LAE, mild RVE, severe RAE, mod TR, PASP 53  /  b.  Echo 3/14:  Mild LVH, EF 55%, Tr AI, MAC, mild MR, mod LAE, PASP 31, trivial eff. // c. echo 1/17: EF 55-60%, mild AI, MAC, mild MR, moderate BAE, moderate TR  . Pulmonary HTN ([place])     53 mmHg echo, 2010, moderate TR, mild right ventricular enlargement  . Pulmonary embolus [facility]) 2006    2006, with DVT  . Carotid artery disease ([place])     [id number]: RICA [id number], LICA 123456  //  Carotiud [place] [id number]: RICA 123456; LICA [id number] >> FU 1 year   . Thyroid nodule     non-neoplastic goiter  . Breast cancer ([place])   . S/P laminectomy     June 22, 2011  . Mitral regurgitation     mild, echo, October, 2010  . Mediastinal lymphadenopathy   . Venous insufficiency     Chronic  . Hiatal hernia 2006    EGD  . Acute gastric ulcer without mention of  hemorrhage, perforation, or obstruction 2006    EGD  . Esophagitis, unspecified 2006    EGD  . Diverticulosis   . Hx of adenomatous colonic polyps   . Osteopenia   . Diabetic peripheral neuropathy ([place]) 03/09/2015  . History of nuclear stress test     a. Myoview 2/12: EF 69%, no scar or ischemia     Assessment: 40 yoF with a hx of PAF-on Flecainide and Coumadin presents with dyspnea, AKI.  Cardiology following. Pharmacy consulted to continue and manage warfarin inpatient.  INR therapeutic on admission.  PTA warfarin dose reported as 2.5 mg daily except 5mg  on Thursdays. Consistent with anticoag clinic recommendations from office visit 7/13.  Last dose reported taken this morning 7/14 at 0930.  Hgb/platelets WNL.  Goal of Therapy:  INR 2-3   Plan:  Will not order a warfarin dose tonight since INR therapeutic and patient took her regularly scheduled warfarin dose this morning PTA.  F/u daily INR.  Hershal Coria 04/12/2016,3:49 PM

## 2016-04-12 NOTE — ED Notes (Signed)
Per PTAR, patient is from home.  Patient was complaining of SOB today.  She visited the doctor yesterday and was diagnosed with UTI.  When PTAR, patient was sitting in chair and complaining of SOB.  She was placed on 3L of O2.  She stated it did help.  Patient has a history of heart failure and has a pacemaker.   BP:138/72 HR:64 R:22

## 2016-04-13 ENCOUNTER — Inpatient Hospital Stay (HOSPITAL_COMMUNITY): Payer: Medicare HMO

## 2016-04-13 DIAGNOSIS — I509 Heart failure, unspecified: Secondary | ICD-10-CM

## 2016-04-13 DIAGNOSIS — N183 Chronic kidney disease, stage 3 (moderate): Secondary | ICD-10-CM

## 2016-04-13 DIAGNOSIS — R0602 Shortness of breath: Secondary | ICD-10-CM

## 2016-04-13 DIAGNOSIS — E119 Type 2 diabetes mellitus without complications: Secondary | ICD-10-CM | POA: Insufficient documentation

## 2016-04-13 LAB — BASIC METABOLIC PANEL
Anion gap: 11 (ref 5–15)
BUN: 61 mg/dL — AB (ref 6–20)
CHLORIDE: 91 mmol/L — AB (ref 101–111)
CO2: 32 mmol/L (ref 22–32)
CREATININE: 2.27 mg/dL — AB (ref 0.44–1.00)
Calcium: 9.1 mg/dL (ref 8.9–10.3)
GFR calc Af Amer: 22 mL/min — ABNORMAL LOW (ref >60)
GFR calc non Af Amer: 19 mL/min — ABNORMAL LOW (ref >60)
GLUCOSE: 107 mg/dL — AB (ref 65–99)
POTASSIUM: 3.1 mmol/L — AB (ref 3.5–5.1)
SODIUM: 134 mmol/L — AB (ref 135–145)

## 2016-04-13 LAB — CBC
HCT: 34.3 % — ABNORMAL LOW (ref 36.0–46.0)
HEMOGLOBIN: 11.8 g/dL — AB (ref 12.0–15.0)
MCH: 31.1 pg (ref 26.0–34.0)
MCHC: 34.4 g/dL (ref 30.0–36.0)
MCV: 90.3 fL (ref 78.0–100.0)
PLATELETS: 291 10*3/uL (ref 150–400)
RBC: 3.8 MIL/uL — AB (ref 3.87–5.11)
RDW: 13.9 % (ref 11.5–15.5)
WBC: 3.5 10*3/uL — ABNORMAL LOW (ref 4.0–10.5)

## 2016-04-13 LAB — TSH: TSH: 1.503 u[IU]/mL (ref 0.350–4.500)

## 2016-04-13 LAB — ECHOCARDIOGRAM COMPLETE
Height: 72 in
WEIGHTICAEL: 3504 [oz_av]

## 2016-04-13 LAB — GLUCOSE, CAPILLARY
GLUCOSE-CAPILLARY: 129 mg/dL — AB (ref 65–99)
Glucose-Capillary: 168 mg/dL — ABNORMAL HIGH (ref 65–99)
Glucose-Capillary: 143 mg/dL — ABNORMAL HIGH (ref 65–99)

## 2016-04-13 LAB — PROTIME-INR
INR: 2.37 — AB (ref 0.00–1.49)
PROTHROMBIN TIME: 24.9 s — AB (ref 11.6–15.2)

## 2016-04-13 LAB — MAGNESIUM: MAGNESIUM: 2.1 mg/dL (ref 1.7–2.4)

## 2016-04-13 MED ORDER — WARFARIN SODIUM 2.5 MG PO TABS
2.5000 mg | ORAL_TABLET | Freq: Once | ORAL | Status: AC
  Administered 2016-04-13: 2.5 mg via ORAL
  Filled 2016-04-13: qty 1

## 2016-04-13 MED ORDER — POTASSIUM CHLORIDE CRYS ER 20 MEQ PO TBCR
40.0000 meq | EXTENDED_RELEASE_TABLET | Freq: Once | ORAL | Status: AC
  Administered 2016-04-13: 40 meq via ORAL
  Filled 2016-04-13: qty 2

## 2016-04-13 NOTE — Progress Notes (Addendum)
SUBJECTIVE: The patient is doing well today.  She states "my shortness of breath is so much better" though it is not clear to me that she has had any real intervention at this point other than stopping flecainide.  At this time, she denies chest pain, shortness of breath, or any new concerns.  . cyanocobalamin  500 mcg Oral Daily  . gabapentin  300 mg Oral QHS  . insulin aspart  0-9 Units Subcutaneous TID WC  . metoprolol succinate  12.5 mg Oral Daily  . potassium chloride  40 mEq Oral Once  . pyridoxine  200 mg Oral Daily  . senna-docusate  1 tablet Oral BID  . sodium chloride flush  3 mL Intravenous Q12H  . Warfarin - Pharmacist Dosing Inpatient   Does not apply q1800      OBJECTIVE: Physical Exam: Filed Vitals:   04/12/16 1603 04/12/16 1712 04/12/16 1903 04/13/16 0524  BP:  124/77 114/59 108/55  Pulse: 61 65 63 62  Temp:   97.5 F (36.4 C) 97.7 F (36.5 C)  TempSrc:   Oral Oral  Resp: 16 11 12 16   Height:   6' (1.829 m)   Weight:   220 lb 7.4 oz (100 kg) 219 lb (99.338 kg)  SpO2: 95% 93% 93% 93%   No intake or output data in the 24 hours ending 04/13/16 0932  Telemetry reveals AV pacing  GEN- The patient is well appearing, alert and oriented x 3 today.   Head- normocephalic, atraumatic Eyes-  Sclera clear, conjunctiva pink Ears- hearing intact Oropharynx- clear Neck- supple,   Lungs- decreased BS at R base, normal work of breathing Heart- Regular rate and rhythm, 3/6 SEM LLSB GI- soft, NT, ND, + BS Extremities- no clubbing, cyanosis, or edema Skin- no rash or lesion Psych- euthymic mood, full affect Neuro- strength and sensation are intact  LABS: Basic Metabolic Panel:  Recent Labs  04/12/16 1153 04/13/16 0515  NA 134* 134*  K 2.9* 3.1*  CL 88* 91*  CO2 32 32  GLUCOSE 174* 107*  BUN 66* 61*  CREATININE 2.67* 2.27*  CALCIUM 9.4 9.1  MG  --  2.1   Liver Function Tests:  Recent Labs  04/12/16 1153  AST 27  ALT 15  ALKPHOS 93  BILITOT 0.9    PROT 8.3*  ALBUMIN 4.0   No results for input(s): LIPASE, AMYLASE in the last 72 hours. CBC:  Recent Labs  04/11/16 1624 04/12/16 1153 04/13/16 0515  WBC 4.5 3.8* 3.5*  NEUTROABS 2970 2.5  --   HGB 13.0 13.5 11.8*  HCT 37.6 39.3 34.3*  MCV 90.4 91.0 90.3  PLT 311 301 291   Cardiac Enzymes:  Recent Labs  04/12/16 1153  TROPONINI 0.04*   Thyroid Function Tests:  Recent Labs  04/13/16 0515  TSH 1.503   Anemia Panel: No results for input(s): VITAMINB12, FOLATE, FERRITIN, TIBC, IRON, RETICCTPCT in the last 72 hours.  RADIOLOGY: Dg Chest 2 View  04/12/2016  CLINICAL DATA:  New shortness of breath this morning, history of CHF, hypertension, diabetes mellitus, atrial fibrillation, pulmonary hypertension, prior pulmonary embolism, former smoker EXAM: CHEST  2 VIEW COMPARISON:  04/04/2016 FINDINGS: Left subclavian sequential transvenous pacemaker leads project at right atrium and right ventricle. Enlargement of cardiac silhouette with pulmonary vascular congestion. Aortic atherosclerosis. Large hiatal hernia with air-fluid level. RIGHT pleural effusion and basilar atelectasis. Central peribronchial thickening. No definite infiltrate or pneumothorax. Bones demineralized. IMPRESSION: Enlargement of cardiac silhouette with pulmonary vascular congestion  post pacemaker. Bronchitic changes with RIGHT basilar atelectasis and small RIGHT pleural effusion. Aortic atherosclerosis. Electronically Signed   By: Lavonia Dana M.D.   On: 04/12/2016 12:00   Dg Chest 2 View  04/04/2016  CLINICAL DATA:  Shortness of breath, no chest pain EXAM: CHEST  2 VIEW COMPARISON:  09/24/2015 FINDINGS: There is no focal parenchymal opacity. There is no pleural effusion or pneumothorax. There is mild stable cardiomegaly. There is a dual lead AICD. Moderate size hiatal hernia. The osseous structures are unremarkable. IMPRESSION: No active cardiopulmonary disease. Electronically Signed   By: Kathreen Devoid   On:  04/04/2016 17:20   Ct Chest Wo Contrast  04/12/2016  CLINICAL DATA:  Shortness of breath.  Renal insufficiency. EXAM: CT CHEST WITHOUT CONTRAST TECHNIQUE: Multidetector CT imaging of the chest was performed following the standard protocol without IV contrast. COMPARISON:  Chest x-ray 04/12/2016.  Chest CT 10/14/2011 FINDINGS: Cardiovascular: Heart is enlarged. Coronary artery calcification is noted. Left-sided permanent pacemaker noted. Ascending thoracic aorta measures 4.1 cm in diameter with atherosclerotic calcification in the wall. Mediastinum/Nodes: 10 mm short axis right paratracheal lymph node is stable. 14 mm short axis subcarinal lymph node measured 10 mm previously. No evidence for gross hilar lymphadenopathy although assessment is limited by the lack of intravenous contrast on today's study. There is no axillary lymphadenopathy. 17 mm posterior right thyroid nodule is stable. Lungs/Pleura: Centrilobular emphysema noted bilaterally. No focal airspace consolidation. There is some septal thickening dependently in the lower lobes in the right middle lobe, nonspecific. No suspicious pulmonary nodule or mass. No overt airspace pulmonary edema or pleural effusion. Upper Abdomen: Moderate to large hiatal hernia noted with approximately 50% of the stomach in the thorax. Musculoskeletal: Bone windows reveal no worrisome lytic or sclerotic osseous lesions. IMPRESSION: 1. No acute findings. 2. Emphysema with mild basilar peripheral septal thickening, nonspecific. 3. Moderate to large hiatal hernia with 50% or more of the stomach being in the thorax. 4. 4.1 cm ascending aortic diameter. Recommend annual imaging followup by CTA or MRA. This recommendation follows 2010 ACCF/AHA/AATS/ACR/ASA/SCA/SCAI/SIR/STS/SVM Guidelines for the Diagnosis and Management of Patients with Thoracic Aortic Disease. Circulation. 2010; [id number] Electronically Signed   By: Misty Stanley M.D.   On: 04/12/2016 17:06    ASSESSMENT  AND PLAN:   1. SOB Unclear etiology I am surprised that she clinically feels so much better despite no real intervention performed. It may be that her symptoms were related to flecainide toxicity.   (stopping flecainide is about all that we have done). Would keep her off of flecainide and follow clinically. myoview once clinically improved (possibly as an outpatient) Will have Medtronic interrogate pacemaker on Monday.  Hopefully off of flecainide, AV interval will be decreased and we can turn MVP feature back on to minimize V pacing Would not add any diuretics currently  2. Atrial fibrillation Currently in sinus Drs Ron Parker and I have both previously advised that she reduce flecainide due to concerns of toxicity.  Unfortunately, she did not reduce her medicine.  She could have flecainide toxicity.  I agree with Dr Sallyanne Kuster that stopping flecainide is appropriate.  If she has further AF in the future then we will consider other options. Continue coumadin  3. Sick sinus syndrome  As above, once flecainide washes out, will reinterrogate pacemaker on Monday to see if she can minimize V pacing  4. Acute on chronic renal failure Hold diuresis and observe  5. Hypokalemia replete  6. Acute on chronic diastolic dysfunction  Recently with worsening SOB and increasing lasix dosing Lets keep lasix on hold and follow as flecainide washes out  7. H/o PTE Continue long term anticoagulation  Very complicated patient.  A high level of decision making was required for this encounter.   Thompson Grayer, MD 04/13/2016 9:32 AM

## 2016-04-13 NOTE — Progress Notes (Signed)
PROGRESS NOTE  [name] [name] [id number] DOB: April 25, 1931 DOA: 04/12/2016 PCP: Hoyt Koch, MD  HPI/Recap of past 24 hours:  Feeling better this am  Assessment/Plan: Active Problems:   Diabetes mellitus ([place])   Atrial fibrillation ([place])   History of pulmonary embolism   History of breast cancer   Ejection fraction-55-60% by echo Jan 2017   Pacemaker-Medtronic   Hypertension   Chronic diastolic CHF (congestive heart failure) (HCC)   CRD (chronic renal disease), stage III   Acute on chronic renal insufficiency (HCC)   Acute on chronic diastolic (congestive) heart failure (HCC)   Chronic anticoagulation-Coumadin   Dyspnea   SOB (shortness of breath)   Fatigue/sob/FTT ( presenting symptom): patient does not look in heart failure, she looks rather dry. Presenting symptom could be multifactorial, she reported her pace maker setting was recently adjusted, she look dehydrated, she has hypokalemia, she does not seem to have uti this time. She is on anticoagulation so PE less likely. Feeling better this am, continue hold diuretics, cardiology/ep following  Hypokalemia: replace k,  Mag wnl  AKI on ckd III: clinically dry, hold diuretics. Cr better   PAF on coumadin, SSS status post MDT PPM, diastolic HF-EF 0000000 by echo jan 2017, currently look dry, hold lasix, pharmacy to dose coumadin, cardiology consulted who recommended to hold flecainide, further management per cardiology  Remote h/o DVT/PE on coumadin. IN therapeutic  noinsulin dependent diabetes: a1c pending, hold metformin, start ssi  Peripheral neuropathy: home dose neurontin decreased to 300mg  qhd due to elevated cr   DVT prophylaxis: on coumadin  Code Status: full  Family Communication: patient   Disposition Plan: home in a few days   Consultants:  Cardiology  EP  Procedures:  none  Antibiotics:  none   Objective: BP 108/55 mmHg  Pulse 62  Temp(Src) 97.7 F (36.5 C) (Oral)   Resp 16  Ht 6' (1.829 m)  Wt 99.338 kg (219 lb)  BMI 29.70 kg/m2  SpO2 93% No intake or output data in the 24 hours ending 04/13/16 0939 Filed Weights   04/12/16 1903 04/13/16 0524  Weight: 100 kg (220 lb 7.4 oz) 99.338 kg (219 lb)    Exam:   General:  NAD  Cardiovascular: RRR  Respiratory: CTABL  Abdomen: Soft/ND/NT, positive BS  Musculoskeletal: No Edema  Neuro: aaox3  Data Reviewed: Basic Metabolic Panel:  Recent Labs Lab 04/11/16 1624 04/12/16 1153 04/13/16 0515  NA 137 134* 134*  K 3.4* 2.9* 3.1*  CL 91* 88* 91*  CO2 28 32 32  GLUCOSE 122* 174* 107*  BUN 66* 66* 61*  CREATININE 2.86* 2.67* 2.27*  CALCIUM 9.3 9.4 9.1  MG  --   --  2.1   Liver Function Tests:  Recent Labs Lab 04/12/16 1153  AST 27  ALT 15  ALKPHOS 93  BILITOT 0.9  PROT 8.3*  ALBUMIN 4.0   No results for input(s): LIPASE, AMYLASE in the last 168 hours. No results for input(s): AMMONIA in the last 168 hours. CBC:  Recent Labs Lab 04/11/16 1624 04/12/16 1153 04/13/16 0515  WBC 4.5 3.8* 3.5*  NEUTROABS 2970 2.5  --   HGB 13.0 13.5 11.8*  HCT 37.6 39.3 34.3*  MCV 90.4 91.0 90.3  PLT 311 301 291   Cardiac Enzymes:    Recent Labs Lab 04/12/16 1153  TROPONINI 0.04*   BNP (last 3 results)  Recent Labs  09/27/15 1500 04/04/16 1737 04/12/16 1153  BNP 607.5* 437.0* 658.5*    ProBNP (  last 3 results) No results for input(s): PROBNP in the last 8760 hours.  CBG:  Recent Labs Lab 04/12/16 1949 04/13/16 0734  GLUCAP 96 168*    Recent Results (from the past 240 hour(s))  Urine culture     Status: Abnormal   Collection Time: 04/04/16  9:54 PM  Result Value Ref Range Status   Specimen Description URINE, CLEAN CATCH  Final   Special Requests NONE  Final   Culture >=100,000 COLONIES/mL ESCHERICHIA COLI (A)  Final   Report Status 04/07/2016 FINAL  Final   Organism ID, Bacteria ESCHERICHIA COLI (A)  Final      Susceptibility   Escherichia coli - MIC*     AMPICILLIN 8 SENSITIVE Sensitive     CEFAZOLIN <=4 SENSITIVE Sensitive     CEFTRIAXONE <=1 SENSITIVE Sensitive     CIPROFLOXACIN <=0.25 SENSITIVE Sensitive     GENTAMICIN <=1 SENSITIVE Sensitive     IMIPENEM <=0.25 SENSITIVE Sensitive     NITROFURANTOIN 32 SENSITIVE Sensitive     TRIMETH/SULFA >=320 RESISTANT Resistant     AMPICILLIN/SULBACTAM 4 SENSITIVE Sensitive     PIP/TAZO <=4 SENSITIVE Sensitive     * >=100,000 COLONIES/mL ESCHERICHIA COLI     Studies: Dg Chest 2 View  04/12/2016  CLINICAL DATA:  New shortness of breath this morning, history of CHF, hypertension, diabetes mellitus, atrial fibrillation, pulmonary hypertension, prior pulmonary embolism, former smoker EXAM: CHEST  2 VIEW COMPARISON:  04/04/2016 FINDINGS: Left subclavian sequential transvenous pacemaker leads project at right atrium and right ventricle. Enlargement of cardiac silhouette with pulmonary vascular congestion. Aortic atherosclerosis. Large hiatal hernia with air-fluid level. RIGHT pleural effusion and basilar atelectasis. Central peribronchial thickening. No definite infiltrate or pneumothorax. Bones demineralized. IMPRESSION: Enlargement of cardiac silhouette with pulmonary vascular congestion post pacemaker. Bronchitic changes with RIGHT basilar atelectasis and small RIGHT pleural effusion. Aortic atherosclerosis. Electronically Signed   By: Lavonia Dana M.D.   On: 04/12/2016 12:00   Ct Chest Wo Contrast  04/12/2016  CLINICAL DATA:  Shortness of breath.  Renal insufficiency. EXAM: CT CHEST WITHOUT CONTRAST TECHNIQUE: Multidetector CT imaging of the chest was performed following the standard protocol without IV contrast. COMPARISON:  Chest x-ray 04/12/2016.  Chest CT 10/14/2011 FINDINGS: Cardiovascular: Heart is enlarged. Coronary artery calcification is noted. Left-sided permanent pacemaker noted. Ascending thoracic aorta measures 4.1 cm in diameter with atherosclerotic calcification in the wall. Mediastinum/Nodes:  10 mm short axis right paratracheal lymph node is stable. 14 mm short axis subcarinal lymph node measured 10 mm previously. No evidence for gross hilar lymphadenopathy although assessment is limited by the lack of intravenous contrast on today's study. There is no axillary lymphadenopathy. 17 mm posterior right thyroid nodule is stable. Lungs/Pleura: Centrilobular emphysema noted bilaterally. No focal airspace consolidation. There is some septal thickening dependently in the lower lobes in the right middle lobe, nonspecific. No suspicious pulmonary nodule or mass. No overt airspace pulmonary edema or pleural effusion. Upper Abdomen: Moderate to large hiatal hernia noted with approximately 50% of the stomach in the thorax. Musculoskeletal: Bone windows reveal no worrisome lytic or sclerotic osseous lesions. IMPRESSION: 1. No acute findings. 2. Emphysema with mild basilar peripheral septal thickening, nonspecific. 3. Moderate to large hiatal hernia with 50% or more of the stomach being in the thorax. 4. 4.1 cm ascending aortic diameter. Recommend annual imaging followup by CTA or MRA. This recommendation follows 2010 ACCF/AHA/AATS/ACR/ASA/SCA/SCAI/SIR/STS/SVM Guidelines for the Diagnosis and Management of Patients with Thoracic Aortic Disease. Circulation. 2010; 121: [id number] Electronically Signed  By: Misty Stanley M.D.   On: 04/12/2016 17:06    Scheduled Meds: . cyanocobalamin  500 mcg Oral Daily  . gabapentin  300 mg Oral QHS  . insulin aspart  0-9 Units Subcutaneous TID WC  . metoprolol succinate  12.5 mg Oral Daily  . potassium chloride  40 mEq Oral Once  . pyridoxine  200 mg Oral Daily  . senna-docusate  1 tablet Oral BID  . sodium chloride flush  3 mL Intravenous Q12H  . Warfarin - Pharmacist Dosing Inpatient   Does not apply q1800    Continuous Infusions:    Time spent: 29mins  Arrion Burruel MD, PhD  [facility] Pager [phone]. If 7PM-7AM, please contact night-coverage at  [email or web address], password [facility] 04/13/2016, 9:39 AM  LOS: 1 day

## 2016-04-13 NOTE — Progress Notes (Signed)
ANTICOAGULATION CONSULT NOTE - Initial Consult  Pharmacy Consult for warfarin Indication: atrial fibrillation  Allergies  Allergen Reactions  . Morphine And Related Other (See Comments)    Family requests no morphine due to past reaction. Pt stated it made her sick.  . Cephalexin Nausea And Vomiting    Patient Measurements: Height: 6' (182.9 cm) Weight: 219 lb (99.338 kg) IBW/kg (Calculated) : 73.1   Vital Signs: Temp: 97.7 F (36.5 C) (07/15 0524) Temp Source: Oral (07/15 0524) BP: 108/55 mmHg (07/15 0524) Pulse Rate: 62 (07/15 0524)  Labs:  Recent Labs  04/11/16 1617  04/11/16 1624 04/12/16 1153 04/13/16 0515  HGB  --   < > 13.0 13.5 11.8*  HCT  --   --  37.6 39.3 34.3*  PLT  --   --  311 301 291  LABPROT  --   --   --  23.5* 24.9*  INR 2.1  --   --  2.20* 2.37*  CREATININE  --   --  2.86* 2.67* 2.27*  TROPONINI  --   --   --  0.04*  --   < > = values in this interval not displayed.  Estimated Creatinine Clearance: 24.8 mL/min (by C-G formula based on Cr of 2.27).   Medical History: Past Medical History  Diagnosis Date  . Hypertension   . Diabetes mellitus   . Tachy-brady syndrome ([place])     s/p pacer 07/2009  . Atrial fibrillation (HCC)     flecainide; coumadin  . Chronic diastolic heart failure (HCC)     a. echo 10/10: mild LVH, EF 55-60%, mild AI, mild MR, mild to mod LAE, mild RVE, severe RAE, mod TR, PASP 53  /  b.  Echo 3/14:  Mild LVH, EF 55%, Tr AI, MAC, mild MR, mod LAE, PASP 31, trivial eff. // c. echo 1/17: EF 55-60%, mild AI, MAC, mild MR, moderate BAE, moderate TR  . Pulmonary HTN ([place])     53 mmHg echo, 2010, moderate TR, mild right ventricular enlargement  . Pulmonary embolus [facility]) 2006    2006, with DVT  . Carotid artery disease ([place])     [id number]: RICA [id number], LICA [id number]  //  [employer] [place] [id number]: RICA 123456; LICA [id number] >> FU 1 year   . Thyroid nodule     non-neoplastic goiter  . Breast cancer ([place])   . S/P laminectomy     June 22, 2011  . Mitral regurgitation     mild, echo, October, 2010  . Mediastinal lymphadenopathy   . Venous insufficiency     Chronic  . Hiatal hernia 2006    EGD  . Acute gastric ulcer without mention of hemorrhage, perforation, or obstruction 2006    EGD  . Esophagitis, unspecified 2006    EGD  . Diverticulosis   . Hx of adenomatous colonic polyps   . Osteopenia   . Diabetic peripheral neuropathy ([place]) 03/09/2015  . History of nuclear stress test     a. Myoview 2/12: EF 69%, no scar or ischemia   Assessment: 73 yoF with a hx of PAF-on Flecainide and Coumadin presents with dyspnea, AKI.  Cardiology following. Pharmacy consulted to continue and manage warfarin inpatient.  INR therapeutic on admission.  PTA warfarin dose reported as 2.5 mg daily except 5mg  on Thursdays. Consistent with anticoag clinic recommendations from office visit 7/13.  Last dose reported taken 7/14 at 0930.  Hgb/platelets WNL on admit  Today, 04/13/2016 INR 2.37, H/H decreased, Plt wnl,  no sign of bleed  Goal of Therapy:  INR 2-3   Plan:   Warfarin 2.5mg  today at 1800  Daily PT/INR  CBC in am  Minda Ditto PharmD Pager [phone] 04/13/2016, 10:18 AM

## 2016-04-13 NOTE — Progress Notes (Signed)
  Echocardiogram 2D Echocardiogram has been performed.  Diamond Nickel 04/13/2016, 12:25 PM

## 2016-04-13 NOTE — Plan of Care (Signed)
Problem: Pain Managment: Goal: General experience of comfort will improve Outcome: Completed/Met Date Met:  04/13/16 Pt reports being comfortable and denies pain

## 2016-04-14 ENCOUNTER — Encounter (HOSPITAL_COMMUNITY): Payer: Self-pay | Admitting: Internal Medicine

## 2016-04-14 DIAGNOSIS — I482 Chronic atrial fibrillation: Secondary | ICD-10-CM

## 2016-04-14 LAB — GLUCOSE, CAPILLARY
GLUCOSE-CAPILLARY: 143 mg/dL — AB (ref 65–99)
GLUCOSE-CAPILLARY: 107 mg/dL — AB (ref 65–99)
Glucose-Capillary: 132 mg/dL — ABNORMAL HIGH (ref 65–99)
Glucose-Capillary: 162 mg/dL — ABNORMAL HIGH (ref 65–99)
Glucose-Capillary: 121 mg/dL — ABNORMAL HIGH (ref 65–99)

## 2016-04-14 LAB — BASIC METABOLIC PANEL
ANION GAP: 10 (ref 5–15)
BUN: 62 mg/dL — AB (ref 6–20)
CHLORIDE: 96 mmol/L — AB (ref 101–111)
CO2: 31 mmol/L (ref 22–32)
Calcium: 9.3 mg/dL (ref 8.9–10.3)
Creatinine, Ser: 2.15 mg/dL — ABNORMAL HIGH (ref 0.44–1.00)
GFR, EST AFRICAN AMERICAN: 23 mL/min — AB (ref >60)
GFR, EST NON AFRICAN AMERICAN: 20 mL/min — AB (ref >60)
GLUCOSE: 126 mg/dL — AB (ref 65–99)
Potassium: 2.9 mmol/L — ABNORMAL LOW (ref 3.5–5.1)
Sodium: 137 mmol/L (ref 135–145)

## 2016-04-14 LAB — CBC
HEMATOCRIT: 37.6 % (ref 36.0–46.0)
HEMOGLOBIN: 12.8 g/dL (ref 12.0–15.0)
MCH: 30.8 pg (ref 26.0–34.0)
MCHC: 34 g/dL (ref 30.0–36.0)
MCV: 90.6 fL (ref 78.0–100.0)
Platelets: 278 10*3/uL (ref 150–400)
RBC: 4.15 MIL/uL (ref 3.87–5.11)
RDW: 13.8 % (ref 11.5–15.5)
WBC: 3.5 10*3/uL — ABNORMAL LOW (ref 4.0–10.5)

## 2016-04-14 LAB — PROTIME-INR
INR: 2.21 — ABNORMAL HIGH (ref 0.00–1.49)
PROTHROMBIN TIME: 23.6 s — AB (ref 11.6–15.2)

## 2016-04-14 LAB — MAGNESIUM: MAGNESIUM: 2.3 mg/dL (ref 1.7–2.4)

## 2016-04-14 MED ORDER — POTASSIUM CHLORIDE CRYS ER 20 MEQ PO TBCR
40.0000 meq | EXTENDED_RELEASE_TABLET | ORAL | Status: AC
  Administered 2016-04-14 (×2): 40 meq via ORAL
  Filled 2016-04-14 (×2): qty 2

## 2016-04-14 MED ORDER — WARFARIN SODIUM 2.5 MG PO TABS
2.5000 mg | ORAL_TABLET | Freq: Once | ORAL | Status: AC
  Administered 2016-04-14: 2.5 mg via ORAL
  Filled 2016-04-14: qty 1

## 2016-04-14 MED ORDER — HYDROCORTISONE 1 % EX CREA
TOPICAL_CREAM | CUTANEOUS | Status: DC | PRN
  Administered 2016-04-14: via TOPICAL
  Filled 2016-04-14: qty 28

## 2016-04-14 NOTE — Progress Notes (Signed)
ANTICOAGULATION CONSULT NOTE - Initial Consult  Pharmacy Consult for warfarin Indication: atrial fibrillation  Allergies  Allergen Reactions  . Morphine And Related Other (See Comments)    Family requests no morphine due to past reaction. Pt stated it made her sick.  . Cephalexin Nausea And Vomiting   Patient Measurements: Height: 6' (182.9 cm) Weight: 219 lb (99.338 kg) IBW/kg (Calculated) : 73.1  Vital Signs: Temp: 97.2 F (36.2 C) (07/16 0647) Temp Source: Oral (07/16 0647) BP: 118/62 mmHg (07/16 0647) Pulse Rate: 60 (07/16 0647)  Labs:  Recent Labs  04/12/16 1153 04/13/16 0515 04/14/16 0513  HGB 13.5 11.8* 12.8  HCT 39.3 34.3* 37.6  PLT 301 291 278  LABPROT 23.5* 24.9* 23.6*  INR 2.20* 2.37* 2.21*  CREATININE 2.67* 2.27* 2.15*  TROPONINI 0.04*  --   --    Estimated Creatinine Clearance: 26.2 mL/min (by C-G formula based on Cr of 2.15).  Medical History: Past Medical History  Diagnosis Date  . Hypertension   . Diabetes mellitus   . Tachy-brady syndrome ([place])     s/p pacer 07/2009  . Atrial fibrillation (HCC)     flecainide; coumadin  . Chronic diastolic heart failure (HCC)     a. echo 10/10: mild LVH, EF 55-60%, mild AI, mild MR, mild to mod LAE, mild RVE, severe RAE, mod TR, PASP 53  /  b.  Echo 3/14:  Mild LVH, EF 55%, Tr AI, MAC, mild MR, mod LAE, PASP 31, trivial eff. // c. echo 1/17: EF 55-60%, mild AI, MAC, mild MR, moderate BAE, moderate TR  . Pulmonary HTN ([place])     53 mmHg echo, 2010, moderate TR, mild right ventricular enlargement  . Pulmonary embolus [facility]) 2006    2006, with DVT  . Carotid artery disease ([employer])     [id number]: RICA [id number], LICA 123456  //  Car[employer] [place] [id number]: RICA 123456; LICA [id number] >> FU 1 year   . Thyroid nodule     non-neoplastic goiter  . Breast cancer ([place])   . S/P laminectomy     June 22, 2011  . Mitral regurgitation     mild, echo, October, 2010  . Mediastinal lymphadenopathy   . Venous insufficiency     Chronic  .  Hiatal hernia 2006    EGD  . Acute gastric ulcer without mention of hemorrhage, perforation, or obstruction 2006    EGD  . Esophagitis, unspecified 2006    EGD  . Diverticulosis   . Hx of adenomatous colonic polyps   . Osteopenia   . Diabetic peripheral neuropathy ([place]) 03/09/2015  . History of nuclear stress test     a. Myoview 2/12: EF 69%, no scar or ischemia   Assessment: 53 yoF with a hx of PAF-on Flecainide and Coumadin presents with dyspnea, AKI.  Cardiology following. Pharmacy consulted to continue and manage warfarin inpatient.  INR therapeutic on admission.  PTA warfarin dose reported as 2.5 mg daily except 5mg  on Thursdays. Consistent with anticoag clinic recommendations from office visit 7/13.  Last dose reported taken 7/14 at 0930.  Hgb/platelets WNL on admit  Today, 04/14/2016 INR 2.21, H/H low but stable, Plt wnl, no sign of bleed  Goal of Therapy:  INR 2-3   Plan:   Warfarin 2.5mg  today at 1800  Daily PT/INR  CBC in am  Minda Ditto PharmD Pager [phone] 04/14/2016, 9:10 AM

## 2016-04-14 NOTE — Progress Notes (Addendum)
SUBJECTIVE: The patient is doing well today.  Still feels that her SOB is better.  She became tired yesterday afternoon.  At this time, she denies chest pain, shortness of breath, or any new concerns.  . cyanocobalamin  500 mcg Oral Daily  . gabapentin  300 mg Oral QHS  . insulin aspart  0-9 Units Subcutaneous TID WC  . metoprolol succinate  12.5 mg Oral Daily  . potassium chloride  40 mEq Oral Q4H  . pyridoxine  200 mg Oral Daily  . senna-docusate  1 tablet Oral BID  . sodium chloride flush  3 mL Intravenous Q12H  . Warfarin - Pharmacist Dosing Inpatient   Does not apply q1800   . hydrocortisone cream      OBJECTIVE: Physical Exam: Filed Vitals:   04/13/16 1046 04/13/16 1248 04/13/16 2152 04/14/16 0647  BP: 114/62 107/54 119/60 118/62  Pulse: 64 67 62 60  Temp:  98.2 F (36.8 C) 97.6 F (36.4 C) 97.2 F (36.2 C)  TempSrc:  Oral Oral Oral  Resp:  20 18 18   Height:      Weight:      SpO2:  97% 97% 100%    Intake/Output Summary (Last 24 hours) at 04/14/16 0851 Last data filed at 04/13/16 1100  Gross per 24 hour  Intake    240 ml  Output      0 ml  Net    240 ml    Telemetry reveals AV pacing  GEN- The patient is well appearing, alert and oriented x 3 today.   Head- normocephalic, atraumatic Eyes-  Sclera clear, conjunctiva pink Ears- hearing intact Oropharynx- clear Neck- supple,   Lungs- decreased BS at R base, normal work of breathing Heart- Regular rate and rhythm, 3/6 SEM LLSB GI- soft, NT, ND, + BS Extremities- no clubbing, cyanosis, or edema Skin- no rash or lesion Psych- euthymic mood, full affect Neuro- strength and sensation are intact  LABS: Basic Metabolic Panel:  Recent Labs  04/13/16 0515 04/14/16 0513  NA 134* 137  K 3.1* 2.9*  CL 91* 96*  CO2 32 31  GLUCOSE 107* 126*  BUN 61* 62*  CREATININE 2.27* 2.15*  CALCIUM 9.1 9.3  MG 2.1 2.3   Liver Function Tests:  Recent Labs  04/12/16 1153  AST 27  ALT 15  ALKPHOS 93  BILITOT  0.9  PROT 8.3*  ALBUMIN 4.0   No results for input(s): LIPASE, AMYLASE in the last 72 hours. CBC:  Recent Labs  04/11/16 1624 04/12/16 1153 04/13/16 0515 04/14/16 0513  WBC 4.5 3.8* 3.5* 3.5*  NEUTROABS 2970 2.5  --   --   HGB 13.0 13.5 11.8* 12.8  HCT 37.6 39.3 34.3* 37.6  MCV 90.4 91.0 90.3 90.6  PLT 311 301 291 278   Cardiac Enzymes:  Recent Labs  04/12/16 1153  TROPONINI 0.04*   Thyroid Function Tests:  Recent Labs  04/13/16 0515  TSH 1.503    ASSESSMENT AND PLAN:   1. SOB Unclear etiology I am surprised that she clinically feels so much better despite no real intervention performed. It may be that her symptoms were related to flecainide toxicity.   (stopping flecainide is about all that we have done). Would keep her off of flecainide and follow clinically. myoview once clinically improved (possibly as an outpatient) Will have Medtronic interrogate pacemaker on Monday (I have already requested this).  Hopefully off of flecainide, AV interval will be decreased and we can turn MVP feature back  on to minimize V pacing Would not add any diuretics currently  2. Atrial fibrillation Currently in sinus off flecainide Continue low dose toprol Continue coumadin  3. Sick sinus syndrome  As above, once flecainide washes out, will reinterrogate pacemaker on Monday to see if she can minimize V pacing  4. Acute on chronic renal failure Hold diuresis and observe  5. Hypokalemia replete  6. Acute on chronic diastolic dysfunction Recently with worsening SOB and increasing lasix dosing Lets keep lasix on hold and follow as flecainide washes out  7. H/o PTE Continue long term anticoagulation  Very complicated patient.  A high level of decision making was required for this encounter. General cardiology to follow up tomorrow.  Thompson Grayer, MD 04/14/2016 8:51 AM

## 2016-04-14 NOTE — Progress Notes (Signed)
PROGRESS NOTE  [name] [name] [id number] DOB: August 09, 1931 DOA: 04/12/2016 PCP: Hoyt Koch, MD  HPI/Recap of past 24 hours:  Feeling better this am, very pleasant, seems to have more energy  Assessment/Plan: Active Problems:   Diabetes mellitus ([place])   Atrial fibrillation ([place])   History of pulmonary embolism   History of breast cancer   Ejection fraction-55-60% by echo Jan 2017   Pacemaker-Medtronic   Hypertension   Chronic diastolic CHF (congestive heart failure) (HCC)   CRD (chronic renal disease), stage III   Acute on chronic renal insufficiency (HCC)   Acute on chronic diastolic (congestive) heart failure (HCC)   Chronic anticoagulation-Coumadin   Dyspnea   SOB (shortness of breath)   Diabetes mellitus type 2, noninsulin dependent (HCC)   Fatigue/sob/FTT ( presenting symptom): patient does not seems to be in heart failure, she looks rather dry on presentation. Presenting symptom could be multifactorial, she reported her pace maker setting was recently adjusted, she look dehydrated, she has hypokalemia, she does not seem to have uti this time. She is on anticoagulation so PE less likely. Feeling better this am, continue hold diuretics, cardiology/ep following  Hypokalemia: replace k,  Mag wnl  AKI on ckd III: clinically dry, hold diuretics. Cr continue to improve.   PAF on coumadin, SSS status post MDT PPM, diastolic HF-EF 0000000 by echo jan 2017, currently look dry, hold lasix, pharmacy to dose coumadin, cardiology consulted who recommended to hold flecainide, further management per cardiology  Remote h/o DVT/PE on coumadin. INR therapeutic  noinsulin dependent diabetes: a1c pending, hold metformin, start ssi  Peripheral neuropathy: home dose neurontin decreased to 300mg  qhs due to elevated cr   DVT prophylaxis: on coumadin  Code Status: full  Family Communication: patient   Disposition Plan: home in 24-48hrs pending cardiology  clearance   Consultants:  Cardiology  EP  Procedures:  Pacemaker interrogation on 7/17  Antibiotics:  none   Objective: BP 118/62 mmHg  Pulse 60  Temp(Src) 97.2 F (36.2 C) (Oral)  Resp 18  Ht 6' (1.829 m)  Wt 99.338 kg (219 lb)  BMI 29.70 kg/m2  SpO2 100%  Intake/Output Summary (Last 24 hours) at 04/14/16 0755 Last data filed at 04/13/16 1100  Gross per 24 hour  Intake    240 ml  Output      0 ml  Net    240 ml   Filed Weights   04/12/16 1903 04/13/16 0524  Weight: 100 kg (220 lb 7.4 oz) 99.338 kg (219 lb)    Exam:   General:  NAD  Cardiovascular: RRR  Respiratory: CTABL  Abdomen: Soft/ND/NT, positive BS  Musculoskeletal: No Edema  Neuro: aaox3  Data Reviewed: Basic Metabolic Panel:  Recent Labs Lab 04/11/16 1624 04/12/16 1153 04/13/16 0515 04/14/16 0513  NA 137 134* 134* 137  K 3.4* 2.9* 3.1* 2.9*  CL 91* 88* 91* 96*  CO2 28 32 32 31  GLUCOSE 122* 174* 107* 126*  BUN 66* 66* 61* 62*  CREATININE 2.86* 2.67* 2.27* 2.15*  CALCIUM 9.3 9.4 9.1 9.3  MG  --   --  2.1 2.3   Liver Function Tests:  Recent Labs Lab 04/12/16 1153  AST 27  ALT 15  ALKPHOS 93  BILITOT 0.9  PROT 8.3*  ALBUMIN 4.0   No results for input(s): LIPASE, AMYLASE in the last 168 hours. No results for input(s): AMMONIA in the last 168 hours. CBC:  Recent Labs Lab 04/11/16 1624 04/12/16 1153 04/13/16 0515 04/14/16 [id number]  WBC 4.5 3.8* 3.5* 3.5*  NEUTROABS 2970 2.5  --   --   HGB 13.0 13.5 11.8* 12.8  HCT 37.6 39.3 34.3* 37.6  MCV 90.4 91.0 90.3 90.6  PLT 311 301 291 278   Cardiac Enzymes:    Recent Labs Lab 04/12/16 1153  TROPONINI 0.04*   BNP (last 3 results)  Recent Labs  09/27/15 1500 04/04/16 1737 04/12/16 1153  BNP 607.5* 437.0* 658.5*    ProBNP (last 3 results) No results for input(s): PROBNP in the last 8760 hours.  CBG:  Recent Labs Lab 04/13/16 0734 04/13/16 1147 04/13/16 1637 04/13/16 2149 04/14/16 0713  GLUCAP 168*  143* 129* 132* 143*    Recent Results (from the past 240 hour(s))  Urine culture     Status: Abnormal   Collection Time: 04/04/16  9:54 PM  Result Value Ref Range Status   Specimen Description URINE, CLEAN CATCH  Final   Special Requests NONE  Final   Culture >=100,000 COLONIES/mL ESCHERICHIA COLI (A)  Final   Report Status 04/07/2016 FINAL  Final   Organism ID, Bacteria ESCHERICHIA COLI (A)  Final      Susceptibility   Escherichia coli - MIC*    AMPICILLIN 8 SENSITIVE Sensitive     CEFAZOLIN <=4 SENSITIVE Sensitive     CEFTRIAXONE <=1 SENSITIVE Sensitive     CIPROFLOXACIN <=0.25 SENSITIVE Sensitive     GENTAMICIN <=1 SENSITIVE Sensitive     IMIPENEM <=0.25 SENSITIVE Sensitive     NITROFURANTOIN 32 SENSITIVE Sensitive     TRIMETH/SULFA >=320 RESISTANT Resistant     AMPICILLIN/SULBACTAM 4 SENSITIVE Sensitive     PIP/TAZO <=4 SENSITIVE Sensitive     * >=100,000 COLONIES/mL ESCHERICHIA COLI     Studies: No results found.  Scheduled Meds: . cyanocobalamin  500 mcg Oral Daily  . gabapentin  300 mg Oral QHS  . insulin aspart  0-9 Units Subcutaneous TID WC  . metoprolol succinate  12.5 mg Oral Daily  . potassium chloride  40 mEq Oral Q4H  . pyridoxine  200 mg Oral Daily  . senna-docusate  1 tablet Oral BID  . sodium chloride flush  3 mL Intravenous Q12H  . Warfarin - Pharmacist Dosing Inpatient   Does not apply q1800    Continuous Infusions: . hydrocortisone cream       Time spent: 41mins  Dejanae Helser MD, PhD  [facility] Pager [phone]. If 7PM-7AM, please contact night-coverage at [email or web address], password [facility] 04/14/2016, 7:55 AM  LOS: 2 days

## 2016-04-14 NOTE — Evaluation (Signed)
Physical Therapy Evaluation-1x eval Patient Details Name: Nakota Abadie MRN: [id number] DOB: 1931-05-18 Today's Date: 04/14/2016   History of Present Illness  82 yo female admitted with SOB, FTT. Hx of SSS-pacemaker, A fib, chronic CHF, HTN, DM, CKDIII, DM, peripheral neuropathy, CAD, laminectomy, osteopenia, PE.   Clinical Impression  On eval, pt was supervision-Mod Ind with mobility. She walked ~125 feet with RW. No dyspnea noted. Pt tolerated activity well. Do not anticipate any follow up PT needs. Recommend daily ambulation in hallway with nursing/family supervision. Will sign off. 1x eval    Follow Up Recommendations No PT follow up;Supervision - Intermittent    Equipment Recommendations  None recommended by PT    Recommendations for Other Services       Precautions / Restrictions Precautions Precautions: Fall Restrictions Weight Bearing Restrictions: No      Mobility  Bed Mobility Overal bed mobility: Modified Independent                Transfers Overall transfer level: Modified independent                  Ambulation/Gait Ambulation/Gait assistance: Supervision Ambulation Distance (Feet): 125 Feet Assistive device: Rolling walker (2 wheeled) Gait Pattern/deviations: Step-through pattern;Decreased stride length     General Gait Details: slow gait speed. No dyspnea noted. Pt tolerated distance well. No LOB with use of walker  Stairs            Wheelchair Mobility    Modified Rankin (Stroke Patients Only)       Balance Overall balance assessment: Needs assistance         Standing balance support: During functional activity Standing balance-Leahy Scale: Poor                               Pertinent Vitals/Pain Pain Assessment: No/denies pain    Home Living Family/patient expects to be discharged to:: Private residence Living Arrangements: Alone Available Help at Discharge: Family (daughter checks on as  needed) Type of Home: House Home Access: Level entry     Home Layout: One level Home Equipment: Environmental consultant - 4 wheels      Prior Function           Comments: uses RW in Civil engineer, contracting        Extremity/Trunk Assessment   Upper Extremity Assessment: Overall WFL for tasks assessed           Lower Extremity Assessment: Generalized weakness      Cervical / Trunk Assessment: Normal  Communication   Communication: No difficulties  Cognition Arousal/Alertness: Awake/alert Behavior During Therapy: WFL for tasks assessed/performed Overall Cognitive Status: Within Functional Limits for tasks assessed                      General Comments      Exercises        Assessment/Plan    PT Assessment Patent does not need any further PT services  PT Diagnosis     PT Problem List    PT Treatment Interventions     PT Goals (Current goals can be found in the Care Plan section) Acute Rehab PT Goals Patient Stated Goal: home soon PT Goal Formulation: All assessment and education complete, DC therapy    Frequency     Barriers to discharge        Co-evaluation  End of Session   Activity Tolerance: Patient tolerated treatment well Patient left: in chair;with call bell/phone within reach;with family/visitor present;with chair alarm set           Time: [id number] PT Time Calculation (min) (ACUTE ONLY): 24 min   Charges:   PT Evaluation $PT Eval Low Complexity: 1 Procedure     PT G Codes:        Weston Anna, MPT Pager: [phone]

## 2016-04-15 ENCOUNTER — Ambulatory Visit (HOSPITAL_COMMUNITY): Payer: Medicare HMO

## 2016-04-15 LAB — GLUCOSE, CAPILLARY
GLUCOSE-CAPILLARY: 138 mg/dL — AB (ref 65–99)
GLUCOSE-CAPILLARY: 203 mg/dL — AB (ref 65–99)
Glucose-Capillary: 84 mg/dL (ref 65–99)
Glucose-Capillary: 148 mg/dL — ABNORMAL HIGH (ref 65–99)

## 2016-04-15 LAB — BASIC METABOLIC PANEL
Anion gap: 10 (ref 5–15)
BUN: 57 mg/dL — AB (ref 6–20)
CALCIUM: 9 mg/dL (ref 8.9–10.3)
CO2: 28 mmol/L (ref 22–32)
CREATININE: 1.96 mg/dL — AB (ref 0.44–1.00)
Chloride: 101 mmol/L (ref 101–111)
GFR, EST AFRICAN AMERICAN: 26 mL/min — AB (ref >60)
GFR, EST NON AFRICAN AMERICAN: 22 mL/min — AB (ref >60)
Glucose, Bld: 115 mg/dL — ABNORMAL HIGH (ref 65–99)
Potassium: 3.6 mmol/L (ref 3.5–5.1)
SODIUM: 139 mmol/L (ref 135–145)

## 2016-04-15 LAB — PROTIME-INR
INR: 2.42 — AB (ref 0.00–1.49)
PROTHROMBIN TIME: 25.3 s — AB (ref 11.6–15.2)

## 2016-04-15 MED ORDER — POTASSIUM CHLORIDE 20 MEQ/15ML (10%) PO SOLN
40.0000 meq | Freq: Once | ORAL | Status: AC
  Administered 2016-04-15: 40 meq via ORAL
  Filled 2016-04-15: qty 30

## 2016-04-15 MED ORDER — POTASSIUM CHLORIDE 20 MEQ/15ML (10%) PO SOLN: 40.0000 meq | Freq: Once | ORAL | Status: DC

## 2016-04-15 MED ORDER — POTASSIUM CHLORIDE CRYS ER 20 MEQ PO TBCR: 40.0000 meq | EXTENDED_RELEASE_TABLET | Freq: Once | ORAL | Status: DC

## 2016-04-15 MED ORDER — WARFARIN SODIUM 2.5 MG PO TABS
2.5000 mg | ORAL_TABLET | Freq: Once | ORAL | Status: AC
  Administered 2016-04-15: 2.5 mg via ORAL
  Filled 2016-04-15: qty 1

## 2016-04-15 NOTE — Progress Notes (Signed)
PROGRESS NOTE  [name] [name] [id number] DOB: 1931/06/28 DOA: 04/12/2016 PCP: Hoyt Koch, MD  HPI/Recap of past 24 hours:  Had brief nose bleed last night,  Over all Continue to improved,  very pleasant,   Assessment/Plan: Active Problems:   Diabetes mellitus ([place])   Atrial fibrillation ([place])   History of pulmonary embolism   History of breast cancer   Ejection fraction-55-60% by echo Jan 2017   Pacemaker-Medtronic   Hypertension   Chronic diastolic CHF (congestive heart failure) (HCC)   CRD (chronic renal disease), stage III   Acute on chronic renal insufficiency (H[facility])   Acute on chronic diastolic (congestive) heart failure (H[facility])   Chronic anticoagulation-Coumadin   Dyspnea   SOB (shortness of breath)   Diabetes mellitus type 2, noninsulin dependent (HCC)   Fatigue/sob/FTT ( presenting symptom): patient does not seems to be in heart failure, she looks rather dry on presentation. Presenting symptom could be multifactorial, she reported her pace maker setting was recently adjusted, she look dehydrated, she has hypokalemia, she does not seem to have uti this time. She is on anticoagulation so PE less likely. Feeling better this am, continue hold diuretics, cardiology/ep following  Hypokalemia: replace k,  Mag wnl  AKI on ckd III: clinically dry, hold diuretics. Cr continue to improve.   PAF on coumadin, SSS status post MDT PPM, diastolic HF-EF 0000000 by echo jan 2017, currently look dry, hold lasix, pharmacy to dose coumadin, cardiology consulted who recommended to hold flecainide, further management per cardiology  Remote h/o DVT/PE on coumadin. INR therapeutic  noinsulin dependent diabetes: a1c pending, hold metformin, start ssi  Peripheral neuropathy: home dose neurontin decreased to 300mg  qhs due to elevated cr   DVT prophylaxis: on coumadin  Code Status: full  Family Communication: patient in room, daughter updated over the phone  Disposition  Plan: home. Likely 7/18 pending cardiology clearance   Consultants:  Cardiology  EP  Procedures:  Pacemaker interrogation on 7/17  Antibiotics:  none   Objective: BP 144/70 mmHg  Pulse 62  Temp(Src) 98.3 F (36.8 C) (Oral)  Resp 18  Ht 6' (1.829 m)  Wt 99.2 kg (218 lb 11.1 oz)  BMI 29.65 kg/m2  SpO2 98%  Intake/Output Summary (Last 24 hours) at 04/15/16 2044 Last data filed at 04/15/16 0934  Gross per 24 hour  Intake    120 ml  Output      0 ml  Net    120 ml   Filed Weights   04/12/16 1903 04/13/16 0524 04/15/16 0526  Weight: 100 kg (220 lb 7.4 oz) 99.338 kg (219 lb) 99.2 kg (218 lb 11.1 oz)    Exam:   General:  NAD  Cardiovascular: RRR  Respiratory: CTABL  Abdomen: Soft/ND/NT, positive BS  Musculoskeletal: No Edema  Neuro: aaox3  Data Reviewed: Basic Metabolic Panel:  Recent Labs Lab 04/11/16 1624 04/12/16 1153 04/13/16 0515 04/14/16 0513 04/15/16 0344  NA 137 134* 134* 137 139  K 3.4* 2.9* 3.1* 2.9* 3.6  CL 91* 88* 91* 96* 101  CO2 28 32 32 31 28  GLUCOSE 122* 174* 107* 126* 115*  BUN 66* 66* 61* 62* 57*  CREATININE 2.86* 2.67* 2.27* 2.15* 1.96*  CALCIUM 9.3 9.4 9.1 9.3 9.0  MG  --   --  2.1 2.3  --    Liver Function Tests:  Recent Labs Lab 04/12/16 1153  AST 27  ALT 15  ALKPHOS 93  BILITOT 0.9  PROT 8.3*  ALBUMIN 4.0  No results for input(s): LIPASE, AMYLASE in the last 168 hours. No results for input(s): AMMONIA in the last 168 hours. CBC:  Recent Labs Lab 04/11/16 1624 04/12/16 1153 04/13/16 0515 04/14/16 0513  WBC 4.5 3.8* 3.5* 3.5*  NEUTROABS 2970 2.5  --   --   HGB 13.0 13.5 11.8* 12.8  HCT 37.6 39.3 34.3* 37.6  MCV 90.4 91.0 90.3 90.6  PLT 311 301 291 278   Cardiac Enzymes:    Recent Labs Lab 04/12/16 1153  TROPONINI 0.04*   BNP (last 3 results)  Recent Labs  09/27/15 1500 04/04/16 1737 04/12/16 1153  BNP 607.5* 437.0* 658.5*    ProBNP (last 3 results) No results for input(s): PROBNP  in the last 8760 hours.  CBG:  Recent Labs Lab 04/14/16 1620 04/14/16 2140 04/15/16 0807 04/15/16 1241 04/15/16 1750  GLUCAP 121* 107* 138* 203* 84    No results found for this or any previous visit (from the past 240 hour(s)).   Studies: No results found.  Scheduled Meds: . cyanocobalamin  500 mcg Oral Daily  . gabapentin  300 mg Oral QHS  . insulin aspart  0-9 Units Subcutaneous TID WC  . metoprolol succinate  12.5 mg Oral Daily  . pyridoxine  200 mg Oral Daily  . senna-docusate  1 tablet Oral BID  . sodium chloride flush  3 mL Intravenous Q12H  . Warfarin - Pharmacist Dosing Inpatient   Does not apply q1800    Continuous Infusions: . hydrocortisone cream       Time spent: 26mins  [name] Sculley MD, PhD  [facility] Pager [phone]. If 7PM-7AM, please contact night-coverage at [email or web address], password [facility] 04/15/2016, 8:44 PM  LOS: 3 days

## 2016-04-15 NOTE — Progress Notes (Signed)
    SUBJECTIVE:  She had a nose bleed last night.  She says that she is breathing OK.  No chest pain.     PHYSICAL EXAM Filed Vitals:   04/14/16 0920 04/14/16 1341 04/15/16 0526 04/15/16 1134  BP: 120/58 109/59 113/57 124/76  Pulse: 59 60 60 68  Temp:  98.5 F (36.9 C) 97.7 F (36.5 C)   TempSrc:  Oral Oral   Resp:  18 18   Height:      Weight:   218 lb 11.1 oz (99.2 kg)   SpO2:  97% 97%    General:  No acute distress. Lungs:  Clear Heart:  RRR Abdomen:  Positive bowel sounds, no rebound no guarding Extremities:  No edema   LABS: Lab Results  Component Value Date   TROPONINI 0.04* 04/12/2016   Results for orders placed or performed during the hospital encounter of 04/12/16 (from the past 24 hour(s))  Glucose, capillary     Status: Abnormal   Collection Time: 04/14/16  4:20 PM  Result Value Ref Range   Glucose-Capillary 121 (H) 65 - 99 mg/dL  Glucose, capillary     Status: Abnormal   Collection Time: 04/14/16  9:40 PM  Result Value Ref Range   Glucose-Capillary 107 (H) 65 - 99 mg/dL  Protime-INR     Status: Abnormal   Collection Time: 04/15/16  3:44 AM  Result Value Ref Range   Prothrombin Time 25.3 (H) 11.6 - 15.2 seconds   INR 2.42 (H) 0.00 - 99991111  Basic metabolic panel     Status: Abnormal   Collection Time: 04/15/16  3:44 AM  Result Value Ref Range   Sodium 139 135 - 145 mmol/L   Potassium 3.6 3.5 - 5.1 mmol/L   Chloride 101 101 - 111 mmol/L   CO2 28 22 - 32 mmol/L   Glucose, Bld 115 (H) 65 - 99 mg/dL   BUN 57 (H) 6 - 20 mg/dL   Creatinine, Ser 1.96 (H) 0.44 - 1.00 mg/dL   Calcium 9.0 8.9 - 10.3 mg/dL   GFR calc non Af Amer 22 (L) >60 mL/min   GFR calc Af Amer 26 (L) >60 mL/min   Anion gap 10 5 - 15  Glucose, capillary     Status: Abnormal   Collection Time: 04/15/16  8:07 AM  Result Value Ref Range   Glucose-Capillary 138 (H) 65 - 99 mg/dL    Intake/Output Summary (Last 24 hours) at 04/15/16 1237 Last data filed at 04/15/16 0934  Gross per 24 hour   Intake    600 ml  Output      0 ml  Net    600 ml    ASSESSMENT AND PLAN:  SOB:  Thought possibly to be related to Flecainide toxicity.  Probable out patient Lexiscan Myoview.    Her potassium is now better.  Creat is improving.  I am going to hold her diuretic again today.  I do suspect that she needs some dosing at home but not as much as she was getting. She will also need close follow up of her labs.   ATRIAL FIB:  On warfarin.  Beta blocker for rate control.   SSS:   Pacer interrogated.   Report reviewed.    ACUTE ON CHRONIC DIASTOLIC HF:    I/O incomplete.  Weight is stable.   ACUTE CHRONIC RENAL FAILURE:  Creat is improved.     Minus Breeding 04/15/2016 12:37 PM

## 2016-04-15 NOTE — Progress Notes (Signed)
ANTICOAGULATION CONSULT NOTE - Follow up [place] for warfarin Indication: atrial fibrillation  Allergies  Allergen Reactions  . Morphine And Related Other (See Comments)    Family requests no morphine due to past reaction. Pt stated it made her sick.  . Cephalexin Nausea And Vomiting   Patient Measurements: Height: 6' (182.9 cm) Weight: 218 lb 11.1 oz (99.2 kg) IBW/kg (Calculated) : 73.1  Vital Signs: Temp: 97.7 F (36.5 C) (07/17 0526) Temp Source: Oral (07/17 0526) BP: 113/57 mmHg (07/17 0526) Pulse Rate: 60 (07/17 0526)  Labs:  Recent Labs  04/12/16 1153 04/13/16 0515 04/14/16 0513 04/15/16 0344  HGB 13.5 11.8* 12.8  --   HCT 39.3 34.3* 37.6  --   PLT 301 291 278  --   LABPROT 23.5* 24.9* 23.6* 25.3*  INR 2.20* 2.37* 2.21* 2.42*  CREATININE 2.67* 2.27* 2.15* 1.96*  TROPONINI 0.04*  --   --   --    Estimated Creatinine Clearance: 28.7 mL/min (by C-G formula based on Cr of 1.96).  Assessment: 78 yoF with a hx of PAF-on Flecainide and Coumadin presents with dyspnea, AKI.  Cardiology following. Pharmacy consulted to continue and manage warfarin inpatient.  INR therapeutic on admission.  PTA warfarin dose reported as 2.5 mg daily except 5mg  on Thursdays. Consistent with anticoag clinic recommendations from office visit 7/13.  Last dose reported taken 7/14 at 0930.   Today, 04/15/2016 INR 2.42, remains therapeutic CBC:  (last on 7/16), H/H low but stable, Plt wnl RN reports nose bleed on 7/17.  Pt describes using several tissues and bleeding stopped within 1 minute.  No further bleeding or complications noted. Diet: heart healthy/carb modified, eating 100% of meals charted. Drug-drug interactions: none noted  Goal of Therapy:  INR 2-3   Plan:   Warfarin 2.5 mg today at 1800  Daily PT/INR  CBC q72h  Gretta Arab PharmD, BCPS Pager [phone] 04/15/2016 9:24 AM

## 2016-04-15 NOTE — Care Management Important Message (Signed)
Important Message  Patient Details  Name: [name] [name] MRN: [id number] Date of Birth: 1930/12/04   Medicare Important Message Given:  Yes    Camillo Flaming 04/15/2016, 9:08 AMImportant Message  Patient Details  Name: [name] [name] MRN: [id number] Date of Birth: [birth year]   Medicare Important Message Given:  Yes    Camillo Flaming 04/15/2016, 9:08 AM

## 2016-04-15 NOTE — Progress Notes (Signed)
Pt had nose bleed. Instructed pt to have HOB 90 degrees and continue pressure until it clots. MD notified.

## 2016-04-15 NOTE — Clinical Documentation Improvement (Signed)
Internal Medicine  Please clarify if you agree with Cardiology's assessment of questionable Flecainide toxicity with evidence of CHF and document findings in next progress note. Thank you!   Other  Clinically Undetermined  Supporting Information:  "She has evidence of CHF without overt hypervolemia on exam (?reduce LV output due to increased V pacing, ?flecainide toxicity).  I would advocate stopping flecainide in the face of CHF and Increased need for V pacing and starting dofetilide or amiodarone if AFib recurs".   "She was supposed to have stopped her Metolazone which she hadn't and was supposed to have been on Flecainide 50 mg BID, not 100 mg BID"  "Key new findings / data: creatinine and BNP both above baseline. Markedly hypokalemic. CXR with congestion, not with overt edema"  Please exercise your independent, professional judgment when responding. A specific answer is not anticipated or expected.  Thank You, Norm Parcel RN, BSN, [place] Clinical Documentation Specialist [facility] [phone]; cell [phone]

## 2016-04-16 LAB — HEMOGLOBIN A1C
HEMOGLOBIN A1C: 6.1 % — AB (ref 4.8–5.6)
MEAN PLASMA GLUCOSE: 128 mg/dL

## 2016-04-16 LAB — CBC
HCT: 35.2 % — ABNORMAL LOW (ref 36.0–46.0)
Hemoglobin: 11.8 g/dL — ABNORMAL LOW (ref 12.0–15.0)
MCH: 31.1 pg (ref 26.0–34.0)
MCHC: 33.5 g/dL (ref 30.0–36.0)
MCV: 92.6 fL (ref 78.0–100.0)
PLATELETS: 249 10*3/uL (ref 150–400)
RBC: 3.8 MIL/uL — ABNORMAL LOW (ref 3.87–5.11)
RDW: 14.2 % (ref 11.5–15.5)
WBC: 4 10*3/uL (ref 4.0–10.5)

## 2016-04-16 LAB — BASIC METABOLIC PANEL
Anion gap: 7 (ref 5–15)
BUN: 51 mg/dL — ABNORMAL HIGH (ref 6–20)
CHLORIDE: 105 mmol/L (ref 101–111)
CO2: 28 mmol/L (ref 22–32)
Calcium: 8.9 mg/dL (ref 8.9–10.3)
Creatinine, Ser: 1.77 mg/dL — ABNORMAL HIGH (ref 0.44–1.00)
GFR calc non Af Amer: 25 mL/min — ABNORMAL LOW (ref >60)
GFR, EST AFRICAN AMERICAN: 29 mL/min — AB (ref >60)
Glucose, Bld: 119 mg/dL — ABNORMAL HIGH (ref 65–99)
POTASSIUM: 3.9 mmol/L (ref 3.5–5.1)
SODIUM: 140 mmol/L (ref 135–145)

## 2016-04-16 LAB — GLUCOSE, CAPILLARY
GLUCOSE-CAPILLARY: 155 mg/dL — AB (ref 65–99)
GLUCOSE-CAPILLARY: 140 mg/dL — AB (ref 65–99)

## 2016-04-16 LAB — PROTIME-INR
INR: 2.38 — ABNORMAL HIGH (ref 0.00–1.49)
Prothrombin Time: 24.9 seconds — ABNORMAL HIGH (ref 11.6–15.2)

## 2016-04-16 MED ORDER — GABAPENTIN 300 MG PO CAPS: 300.0000 mg | ORAL_CAPSULE | Freq: Every day | ORAL | Status: DC

## 2016-04-16 MED ORDER — FUROSEMIDE 80 MG PO TABS: 40.0000 mg | ORAL_TABLET | Freq: Two times a day (BID) | ORAL | Status: DC

## 2016-04-16 MED ORDER — WARFARIN SODIUM 2.5 MG PO TABS: 2.5000 mg | ORAL_TABLET | Freq: Once | ORAL | Status: DC

## 2016-04-16 MED ORDER — WARFARIN SODIUM 5 MG PO TABS: 2.5000 mg | ORAL_TABLET | ORAL | Status: DC

## 2016-04-16 MED ORDER — POTASSIUM CHLORIDE ER 10 MEQ PO TBCR: 20.0000 meq | EXTENDED_RELEASE_TABLET | Freq: Every day | ORAL | Status: DC

## 2016-04-16 NOTE — Care Management Note (Signed)
Case Management Note  Patient Details  Name: [name] [name] MRN: [id number] Date of Birth: 26-Sep-1931  Subjective/Objective:                    Action/Plan:d/c home no needs or orders.   Expected Discharge Date:   (unknown)               Expected Discharge Plan:  Home/Self Care  In-House Referral:     Discharge planning Services  CM Consult  Post Acute Care Choice:    Choice offered to:     DME Arranged:    DME Agency:     HH Arranged:    [place] Agency:     Status of Service:  Completed, signed off  If discussed at [employer] of Stay Meetings, dates discussed:    Additional Comments:  Dessa Phi, RN 04/16/2016, 11:36 AM

## 2016-04-16 NOTE — Progress Notes (Signed)
ANTICOAGULATION CONSULT NOTE - Follow up [place] for warfarin Indication: atrial fibrillation  Allergies  Allergen Reactions  . Morphine And Related Other (See Comments)    Family requests no morphine due to past reaction. Pt stated it made her sick.  . Cephalexin Nausea And Vomiting   Patient Measurements: Height: 6' (182.9 cm) Weight: 219 lb 14.4 oz (99.746 kg) IBW/kg (Calculated) : 73.1  Vital Signs: Temp: 97.8 F (36.6 C) (07/18 0547) Temp Source: Oral (07/18 0547) BP: 110/56 mmHg (07/18 0547) Pulse Rate: 62 (07/18 0547)  Labs:  Recent Labs  04/14/16 0513 04/15/16 0344 04/16/16 0513  HGB 12.8  --  11.8*  HCT 37.6  --  35.2*  PLT 278  --  249  LABPROT 23.6* 25.3* 24.9*  INR 2.21* 2.42* 2.38*  CREATININE 2.15* 1.96* 1.77*   Estimated Creatinine Clearance: 31.8 mL/min (by C-G formula based on Cr of 1.77).  Assessment: 58 yoF with a hx of PAF-on Flecainide and Coumadin presents with dyspnea, AKI.  Cardiology following. Pharmacy consulted to continue and manage warfarin inpatient.  INR therapeutic on admission.  PTA warfarin dose reported as 2.5 mg daily except 5mg  on Thursdays. Consistent with anticoag clinic recommendations from office visit 7/13.  Last dose reported taken 7/14 at 0930.   Today, 04/16/2016 INR 2.38, remains therapeutic CBC:  H/H low but stable, Plt wnl RN reports nose bleed on 7/17.  Pt describes using several tissues and bleeding stopped within 1 minute.  No further bleeding or complications noted. Diet: heart healthy/carb modified, eating 100% of meals charted. Drug-drug interactions: none noted  Goal of Therapy:  INR 2-3   Plan:   Warfarin 2.5 mg today at 1800  Daily PT/INR  CBC q72h  For discharge, would recommend resuming home warfarin dose, warfarin 2.5mg  daily except 5mg  on Thursdays.    Gretta Arab PharmD, BCPS Pager [phone] 04/16/2016 9:14 AM

## 2016-04-16 NOTE — Discharge Summary (Signed)
Discharge Summary  [name] [name] [id number] DOB: 05/01/1931  PCP: Hoyt Koch, MD  Admit date: 04/12/2016 Discharge date: 04/16/2016  Time spent: >2mins  Recommendations for Outpatient Follow-up:  1. F/u with PMD within a week  for hospital discharge follow up, repeat cbc/bmp at follow up,  2. F/u with cardiology/EP 3. F/u with coumadin clinie 4. meds adjustment :  avoid nitrofurantoin and metformin if cr does not continue to improve, neurontin need to be renally dosed, does decrease at discharge, lasix dose reduced, flecainide stopped Discharge Diagnoses:  Active Hospital Problems   Diagnosis Date Noted  . Diabetes mellitus type 2, noninsulin dependent ([place])   . CRD (chronic renal disease), stage III 04/12/2016  . Acute on chronic renal insufficiency ([place]) 04/12/2016  . Acute on chronic diastolic (congestive) heart failure ([place]) 04/12/2016  . Chronic anticoagulation-Coumadin 04/12/2016  . Dyspnea 04/12/2016  . SOB (shortness of breath) 04/12/2016  . Chronic diastolic CHF (congestive heart failure) ([place]) 09/07/2014  . Hypertension 06/21/2013  . History of breast cancer   . Atrial fibrillation ([place])   . Ejection fraction-55-60% by echo Jan 2017   . Pacemaker-Medtronic   . History of pulmonary embolism   . Diabetes mellitus ([place]) 06/13/2008    Resolved Hospital Problems   Diagnosis Date Noted Date Resolved  No resolved problems to display.    Discharge Condition: stable  Diet recommendation: heart healthy/carb modified  Filed Weights   04/13/16 0524 04/15/16 0526 04/16/16 0547  Weight: 99.338 kg (219 lb) 99.2 kg (218 lb 11.1 oz) 99.746 kg (219 lb 14.4 oz)    History of present illness:  Chief Complaint: progressive sob, weakness for the last few weeks  HPI: [name] [name] is a 82 y.o. female   With remote history of breast cancer s/p Left mastectomy, h/o PE, afib/sick sinus syndrome s/p pacemaker and on coumadin, chronic diastolic chf, ckd III,  noninsulin dependent dm2, peripheral neuropathy is brought to the ED by EMS due to c/o sob, patient is not a reliable historian hpi obtained from chart review, talking to EDP and patient's family. Per family patient has been having progressive decline with c/o feeling drained, progressive DOE for the last few week, family reported she was seen in the ED last Thursday due to sob, found to have UTI, she was given keflex but she developed vomiting and diarrhea from it, these symptoms resolved after she stopped taking keflex, her daughter give her nitrofurantoin since 7/8. She denies fever, no abdominal pain, family report her symptom of UTI is sob and low back pain. family also reports her pacemaker setting was adjusted recently.her heart medicine was adjusted as well, patient was just seen by cardiology yesterday due to sob, she was told that she might be dehydrated, and is instructed to hold lasix for three days.  This morning, she finished making her bed and talked to her caregiver over the phone with significant dyspnea, she called EMS and is brought to [facility] ED.   ED course: vital stable, on room air, cxr ? Pulmonary vascular congestion, labs with hypokalemia, she is given potassium in the ED , she is prescribed lasix in the ED which family refused, hospitalist called to admit the patient due to concern of chf exacerbation, patient currently denies chest pain, no cough, no fever, no edema.   Hospital Course:  Active Problems:   Diabetes mellitus ([place])   Atrial fibrillation (HCC)   History of pulmonary embolism   History of breast cancer   Ejection fraction-55-60% by echo  Jan 2017   Pacemaker-Medtronic   Hypertension   Chronic diastolic CHF (congestive heart failure) (HCC)   CRD (chronic renal disease), stage III   Acute on chronic renal insufficiency (HCC)   Acute on chronic diastolic (congestive) heart failure (HCC)   Chronic anticoagulation-Coumadin   Dyspnea   SOB (shortness of breath)    Diabetes mellitus type 2, noninsulin dependent (HCC)   Fatigue/sob/FTT ( presenting symptom):  patient does not seems to be in heart failure, she looks rather dry on presentation. Presenting symptom could be multifactorial, she reported her pace maker setting was recently adjusted, she look dehydrated, she has hypokalemia, she does not seem to have uti this time. She is on anticoagulation so PE less likely. Feeling better , diuretics held in the hospital, restarted at lower dose at discharge, cardiology/ep following  Hypokalemia: replace k, Mag wnl  AKI on ckd III: clinically dry,  Diuretics held in the hospital, resumed at lower a dosage at discharge. Cr continue to improve.   PAF on coumadin, SSS status post MDT PPM, diastolic HF-EF 0000000 by echo jan 2017, currently look dry, hold lasix, pharmacy to dose coumadin, cardiology consulted who recommended to stop flecainide, pacemaker interrogated on 7/17, she is doing better and cleared to discharge home by cardiology with close outpatient cardiology follow up  Remote h/o DVT/PE on coumadin. INR therapeutic  noinsulin dependent diabetes: a1c 6.1,  Metformin held in the hospital, she received a few dose of  ssi coverage in the hospital, cr improving, metformin resumed at discharge, patient is to continue follow up with pmd to decide stopping metformin if cr not continue to improve.  Peripheral neuropathy: home dose neurontin decreased to 300mg  qhs due to elevated cr   DVT prophylaxis: on coumadin  Code Status: full  Family Communication: patient in room, daughter updated over the phone  Disposition Plan: home 7/18    Consultants:  Cardiology  EP  Procedures:  Pacemaker interrogation on 7/17  Antibiotics:  none   Discharge Exam: BP 141/53 mmHg  Pulse 64  Temp(Src) 97.8 F (36.6 C) (Oral)  Resp 18  Ht 6' (1.829 m)  Wt 99.746 kg (219 lb 14.4 oz)  BMI 29.82 kg/m2  SpO2 98%    General: NAD  Cardiovascular:  RRR  Respiratory: CTABL  Abdomen: Soft/ND/NT, positive BS  Musculoskeletal: No Edema  Neuro: aaox3   Discharge Instructions You were cared for by a hospitalist during your hospital stay. If you have any questions about your discharge medications or the care you received while you were in the hospital after you are discharged, you can call the unit and asked to speak with the hospitalist on call if the hospitalist that took care of you is not available. Once you are discharged, your primary care physician will handle any further medical issues. Please note that NO REFILLS for any discharge medications will be authorized once you are discharged, as it is imperative that you return to your primary care physician (or establish a relationship with a primary care physician if you do not have one) for your aftercare needs so that they can reassess your need for medications and monitor your lab values.      Discharge Instructions    Diet - low sodium heart healthy    Complete by:  As directed   Carb modified     Increase activity slowly    Complete by:  As directed             Medication List  STOP taking these medications        bethanechol 25 MG tablet  Commonly known as:  URECHOLINE     flecainide 50 MG tablet  Commonly known as:  TAMBOCOR     HYDROcodone-acetaminophen 5-325 MG tablet  Commonly known as:  NORCO/VICODIN     nitrofurantoin (macrocrystal-monohydrate) 100 MG capsule  Commonly known as:  MACROBID      TAKE these medications        ALPRAZolam 0.25 MG tablet  Commonly known as:  XANAX  Take 1 tablet (0.25 mg total) by mouth daily as needed for anxiety.     furosemide 80 MG tablet  Commonly known as:  LASIX  Take 0.5 tablets (40 mg total) by mouth 2 (two) times daily.     gabapentin 300 MG capsule  Commonly known as:  NEURONTIN  Take 1 capsule (300 mg total) by mouth at bedtime.     metFORMIN 500 MG tablet  Commonly known as:  GLUCOPHAGE  Take 500 mg  by mouth 2 (two) times daily with a meal.     metoprolol succinate 25 MG 24 hr tablet  Commonly known as:  TOPROL-XL  TAKE 1/2 (12.5 MG) TABLET BY MOUTH DAILY     potassium chloride 10 MEQ tablet  Commonly known as:  K-DUR  Take 2 tablets (20 mEq total) by mouth daily.     pyridoxine 200 MG tablet  Commonly known as:  B-6  Take 200 mg by mouth daily.     vitamin B-12 500 MCG tablet  Commonly known as:  CYANOCOBALAMIN  Take 500 mcg by mouth daily.     warfarin 5 MG tablet  Commonly known as:  COUMADIN  Take 0.5-1 tablets (2.5-5 mg total) by mouth See admin instructions. 2.5 mg daily on Sun/Mon/Tues/Wed/Fri/Sat and 5 mg on Thurs       Allergies  Allergen Reactions  . Morphine And Related Other (See Comments)    Family requests no morphine due to past reaction. Pt stated it made her sick.  . Cephalexin Nausea And Vomiting   Follow-up Information    Follow up with Hoyt Koch, MD In 1 week.   Specialty:  Internal Medicine   Why:  hospital discharge follow up, repeat cbc/bmp at follow up, monitor kidney function, stop metformin if renal function not improving, avoid nitrofurontoin due to abnormal kidney function,    Contact information:   [place] [address] [phone]       Follow up with Cecilie Kicks, NP.   Specialties:  Cardiology, Radiology   Why:  Cardiology Hospital Follow-Up on 04/23/2016 at 3:30PM.    Contact information:   [place] [address] [phone]        The results of significant diagnostics from this hospitalization (including imaging, microbiology, ancillary and laboratory) are listed below for reference.    Significant Diagnostic Studies: Dg Chest 2 View  04/12/2016  CLINICAL DATA:  New shortness of breath this morning, history of CHF, hypertension, diabetes mellitus, atrial fibrillation, pulmonary hypertension, prior pulmonary embolism, former smoker EXAM: CHEST  2 VIEW COMPARISON:   04/04/2016 FINDINGS: Left subclavian sequential transvenous pacemaker leads project at right atrium and right ventricle. Enlargement of cardiac silhouette with pulmonary vascular congestion. Aortic atherosclerosis. Large hiatal hernia with air-fluid level. RIGHT pleural effusion and basilar atelectasis. Central peribronchial thickening. No definite infiltrate or pneumothorax. Bones demineralized. IMPRESSION: Enlargement of cardiac silhouette with pulmonary vascular congestion post pacemaker. Bronchitic changes with  RIGHT basilar atelectasis and small RIGHT pleural effusion. Aortic atherosclerosis. Electronically Signed   By: Lavonia Dana M.D.   On: 04/12/2016 12:00   Dg Chest 2 View  04/04/2016  CLINICAL DATA:  Shortness of breath, no chest pain EXAM: CHEST  2 VIEW COMPARISON:  09/24/2015 FINDINGS: There is no focal parenchymal opacity. There is no pleural effusion or pneumothorax. There is mild stable cardiomegaly. There is a dual lead AICD. Moderate size hiatal hernia. The osseous structures are unremarkable. IMPRESSION: No active cardiopulmonary disease. Electronically Signed   By: Kathreen Devoid   On: 04/04/2016 17:20   Ct Chest Wo Contrast  04/12/2016  CLINICAL DATA:  Shortness of breath.  Renal insufficiency. EXAM: CT CHEST WITHOUT CONTRAST TECHNIQUE: Multidetector CT imaging of the chest was performed following the standard protocol without IV contrast. COMPARISON:  Chest x-ray 04/12/2016.  Chest CT 10/14/2011 FINDINGS: Cardiovascular: Heart is enlarged. Coronary artery calcification is noted. Left-sided permanent pacemaker noted. Ascending thoracic aorta measures 4.1 cm in diameter with atherosclerotic calcification in the wall. Mediastinum/Nodes: 10 mm short axis right paratracheal lymph node is stable. 14 mm short axis subcarinal lymph node measured 10 mm previously. No evidence for gross hilar lymphadenopathy although assessment is limited by the lack of intravenous contrast on today's study. There is  no axillary lymphadenopathy. 17 mm posterior right thyroid nodule is stable. Lungs/Pleura: Centrilobular emphysema noted bilaterally. No focal airspace consolidation. There is some septal thickening dependently in the lower lobes in the right middle lobe, nonspecific. No suspicious pulmonary nodule or mass. No overt airspace pulmonary edema or pleural effusion. Upper Abdomen: Moderate to large hiatal hernia noted with approximately 50% of the stomach in the thorax. Musculoskeletal: Bone windows reveal no worrisome lytic or sclerotic osseous lesions. IMPRESSION: 1. No acute findings. 2. Emphysema with mild basilar peripheral septal thickening, nonspecific. 3. Moderate to large hiatal hernia with 50% or more of the stomach being in the thorax. 4. 4.1 cm ascending aortic diameter. Recommend annual imaging followup by CTA or MRA. This recommendation follows 2010 ACCF/AHA/AATS/ACR/ASA/SCA/SCAI/SIR/STS/SVM Guidelines for the Diagnosis and Management of Patients with Thoracic Aortic Disease. Circulation. 2010; [id number] Electronically Signed   By: Misty Stanley M.D.   On: 04/12/2016 17:06    Microbiology: No results found for this or any previous visit (from the past 240 hour(s)).   Labs: Basic Metabolic Panel:  Recent Labs Lab 04/12/16 1153 04/13/16 0515 04/14/16 0513 04/15/16 0344 04/16/16 0513  NA 134* 134* 137 139 140  K 2.9* 3.1* 2.9* 3.6 3.9  CL 88* 91* 96* 101 105  CO2 32 32 31 28 28   GLUCOSE 174* 107* 126* 115* 119*  BUN 66* 61* 62* 57* 51*  CREATININE 2.67* 2.27* 2.15* 1.96* 1.77*  CALCIUM 9.4 9.1 9.3 9.0 8.9  MG  --  2.1 2.3  --   --    Liver Function Tests:  Recent Labs Lab 04/12/16 1153  AST 27  ALT 15  ALKPHOS 93  BILITOT 0.9  PROT 8.3*  ALBUMIN 4.0   No results for input(s): LIPASE, AMYLASE in the last 168 hours. No results for input(s): AMMONIA in the last 168 hours. CBC:  Recent Labs Lab 04/11/16 1624 04/12/16 1153 04/13/16 0515 04/14/16 0513  04/16/16 0513  WBC 4.5 3.8* 3.5* 3.5* 4.0  NEUTROABS 2970 2.5  --   --   --   HGB 13.0 13.5 11.8* 12.8 11.8*  HCT 37.6 39.3 34.3* 37.6 35.2*  MCV 90.4 91.0 90.3 90.6 92.6  PLT 311 301  291 278 249   Cardiac Enzymes:  Recent Labs Lab 04/12/16 1153  TROPONINI 0.04*   BNP: BNP (last 3 results)  Recent Labs  09/27/15 1500 04/04/16 1737 04/12/16 1153  BNP 607.5* 437.0* 658.5*    ProBNP (last 3 results) No results for input(s): PROBNP in the last 8760 hours.  CBG:  Recent Labs Lab 04/15/16 0807 04/15/16 1241 04/15/16 1750 04/15/16 2105 04/16/16 0811  GLUCAP 138* 203* 84 148* 155*       Signed:  Brazil Voytko MD, PhD  [facility] 04/16/2016, 10:25 AM

## 2016-04-19 ENCOUNTER — Ambulatory Visit: Payer: Medicare HMO | Admitting: Internal Medicine

## 2016-04-19 ENCOUNTER — Telehealth: Payer: Self-pay | Admitting: *Deleted

## 2016-04-19 NOTE — Telephone Encounter (Signed)
I spoke with [name] [name] today to reschedule her myoview,she refuse for now.

## 2016-04-23 ENCOUNTER — Ambulatory Visit (INDEPENDENT_AMBULATORY_CARE_PROVIDER_SITE_OTHER): Payer: Medicare HMO | Admitting: Pharmacist

## 2016-04-23 ENCOUNTER — Encounter: Payer: Self-pay | Admitting: *Deleted

## 2016-04-23 ENCOUNTER — Encounter: Payer: Self-pay | Admitting: Cardiology

## 2016-04-23 ENCOUNTER — Ambulatory Visit (INDEPENDENT_AMBULATORY_CARE_PROVIDER_SITE_OTHER): Payer: Medicare HMO | Admitting: Cardiology

## 2016-04-23 VITALS — BP 110/70 | HR 62 | Ht 72.0 in | Wt 223.6 lb

## 2016-04-23 DIAGNOSIS — Z86711 Personal history of pulmonary embolism: Secondary | ICD-10-CM

## 2016-04-23 DIAGNOSIS — R0602 Shortness of breath: Secondary | ICD-10-CM

## 2016-04-23 DIAGNOSIS — I480 Paroxysmal atrial fibrillation: Secondary | ICD-10-CM

## 2016-04-23 DIAGNOSIS — Z950 Presence of cardiac pacemaker: Secondary | ICD-10-CM

## 2016-04-23 DIAGNOSIS — Z7901 Long term (current) use of anticoagulants: Secondary | ICD-10-CM

## 2016-04-23 DIAGNOSIS — I482 Chronic atrial fibrillation, unspecified: Secondary | ICD-10-CM

## 2016-04-23 DIAGNOSIS — E876 Hypokalemia: Secondary | ICD-10-CM

## 2016-04-23 DIAGNOSIS — I5032 Chronic diastolic (congestive) heart failure: Secondary | ICD-10-CM

## 2016-04-23 DIAGNOSIS — R079 Chest pain, unspecified: Secondary | ICD-10-CM

## 2016-04-23 LAB — BASIC METABOLIC PANEL
BUN: 17 mg/dL (ref 7–25)
CHLORIDE: 102 mmol/L (ref 98–110)
CO2: 25 mmol/L (ref 20–31)
CREATININE: 1.6 mg/dL — AB (ref 0.60–0.88)
Calcium: 8.9 mg/dL (ref 8.6–10.4)
Glucose, Bld: 87 mg/dL (ref 65–99)
POTASSIUM: 4.1 mmol/L (ref 3.5–5.3)
SODIUM: 140 mmol/L (ref 135–146)

## 2016-04-23 LAB — POCT INR: INR: 2.7

## 2016-04-23 LAB — MAGNESIUM: Magnesium: 1.7 mg/dL (ref 1.5–2.5)

## 2016-04-23 MED ORDER — POTASSIUM CHLORIDE 20 MEQ/15ML (10%) PO SOLN: 20.0000 meq | Freq: Every day | ORAL | Status: DC

## 2016-04-23 NOTE — Progress Notes (Signed)
Cardiology Office Note   Date:  04/23/2016   ID:  [name] [name], DOB August 21, 1931, MRN [id number]  PCP:  Hoyt Koch, MD  Cardiologist:  Dr. Meda Coffee    Chief Complaint  Patient presents with  . Hospitalization Follow-up      History of Present Illness: [name] [name] is a 82 y.o. female who presents for post hospitalization for dyspnea.  We did follow her in hospital and she did have elevated BNP at 658, and vascular congestion on CXR.  Dr. Sallyanne Kuster saw her and felt this may be flecainide toxicity.  Her flecainide was stopped.  She had been scheduled for [facility] but this was planned as outpt. Dr. Rayann Heman saw her as well and off flecainide she was much improved.   Her pacemaker was interrogated and she did well maintaining SR.  She is back for follow up.   She also was hypokalemic in hospital.   This was replaced but will need to check BMET.    She has a hx of PAF, tachybradycardia syndrome status post PPM, diastolic HF, carotid stenosis, prior DVT/pulmonary embolism, HTN, DM2, breast CA. Patient had done well on low-dose flecainide. Dr. Ron Parker has recommended that she be on flecainide 50 mg twice a day in the past. She has been on Coumadin for anticoagulation. Last seen by Dr. Ron Parker 4/16. Last seen by Dr. Rayann Heman 6/16. At that time, she was on flecainide 100 mg twice a day. He reduced her flecainide back to 50 mg twice a day.  Today no chest pain except for brief episode this AM.  No SOB.  She is back on diuretic but at half dose. Weight is stable at home.  She is monitoring her wt and salt intake. She does complain of bruising.  And it is time for INR check. Her hands cramped up today as well, not with pain but drew in to claw like appearance.  When pt is telling me these things she repeats the same issues every 10 min as if we had not discussed.         Past Medical History:  Diagnosis Date  . Acute gastric ulcer without mention of hemorrhage, perforation, or obstruction  2006   EGD  . Atrial fibrillation (HCC)    flecainide; coumadin  . Breast cancer ([place])   . Carotid artery disease ([place])    [id number]: RICA [id number], LICA [id number]  //  [place] [id number]: RICA [id number]; LICA [id number] >> FU 1 year   . Chronic diastolic heart failure (HCC)    a. echo 10/10: mild LVH, EF 55-60%, mild AI, mild MR, mild to mod LAE, mild RVE, severe RAE, mod TR, PASP 53  /  b.  Echo 3/14:  Mild LVH, EF 55%, Tr AI, MAC, mild MR, mod LAE, PASP 31, trivial eff. // c. echo 1/17: EF 55-60%, mild AI, MAC, mild MR, moderate BAE, moderate TR  . Diabetes mellitus   . Diabetic peripheral neuropathy ([place]) 03/09/2015  . Diverticulosis   . Esophagitis, unspecified 2006   EGD  . Hiatal hernia 2006   EGD  . History of nuclear stress test    a. Myoview 2/12: EF 69%, no scar or ischemia  . Hx of adenomatous colonic polyps   . Hypertension   . Mediastinal lymphadenopathy   . Mitral regurgitation    mild, echo, October, 2010  . Osteopenia   . Pulmonary embolus [facility]) 2006   2006, with DVT  . Pulmonary HTN (HCC)    53 mmHg  echo, 2010, moderate TR, mild right ventricular enlargement  . S/P laminectomy    June 22, 2011  . Tachy-brady syndrome ([place])    s/p pacer 07/2009  . Thyroid nodule    non-neoplastic goiter  . Venous insufficiency    Chronic    Past Surgical History:  Procedure Laterality Date  . ABDOMINAL HYSTERECTOMY    . ANKLE SURGERY    . BACK SURGERY     1973, 2012   . BREAST SURGERY     Left  . PACEMAKER INSERTION  06/30/09   MDT Adalpta L implanted by Dr Olevia Perches     Current Outpatient Prescriptions  Medication Sig Dispense Refill  . ALPRAZolam (XANAX) 0.25 MG tablet Take 1 tablet (0.25 mg total) by mouth daily as needed for anxiety. 30 tablet 0  . furosemide (LASIX) 80 MG tablet Take 0.5 tablets (40 mg total) by mouth 2 (two) times daily. 180 tablet 3  . gabapentin (NEURONTIN) 300 MG capsule Take 300 mg by mouth 3 (three) times daily.     . metFORMIN (GLUCOPHAGE) 500 MG  tablet Take 500 mg by mouth 2 (two) times daily with a meal.     . metoprolol succinate (TOPROL-XL) 25 MG 24 hr tablet TAKE 1/2 (12.5 MG) TABLET BY MOUTH DAILY 45 tablet 3  . pyridoxine (B-6) 200 MG tablet Take 200 mg by mouth daily.    . vitamin B-12 (CYANOCOBALAMIN) 500 MCG tablet Take 500 mcg by mouth daily.     Marland Kitchen warfarin (COUMADIN) 5 MG tablet Take 0.5-1 tablets (2.5-5 mg total) by mouth See admin instructions. 2.5 mg daily on Sun/Mon/Tues/Wed/Fri/Sat and 5 mg on Thurs 30 tablet 3   Current Facility-Administered Medications  Medication Dose Route Frequency Provider Last Rate Last Dose  . [START ON 04/24/2016] potassium chloride 20 MEQ/15ML (10%) solution 20 mEq  20 mEq Oral Daily Isaiah Serge, NP        Allergies:   Flecainide; Morphine and related; and Cephalexin    Social History:  The patient  reports that she quit smoking about 17 years ago. She smoked 4.00 packs per day. She has never used smokeless tobacco. She reports that she does not drink alcohol or use drugs.   Family History:  The patient's family history includes COPD in her father; Heart disease in her mother; Hypertension in her brother, daughter, father, mother, and sister.    ROS:  General:no colds or fevers, no weight changes Skin:no rashes or ulcers HEENT:no blurred vision, no congestion CV:see HPI PUL:see HPI GI:no diarrhea constipation or melena, no indigestion GU:no hematuria, no dysuria MS:no joint pain, no claudication Neuro:no syncope, no lightheadedness Endo:+ diabetes, no thyroid disease  Wt Readings from Last 3 Encounters:  04/23/16 223 lb 9.6 oz (101.4 kg)  04/16/16 219 lb 14.4 oz (99.7 kg)  04/11/16 220 lb 12.8 oz (100.2 kg)     PHYSICAL EXAM: VS:  BP 110/70   Pulse 62   Ht 6' (1.829 m)   Wt 223 lb 9.6 oz (101.4 kg)   BMI 30.33 kg/m  , BMI Body mass index is 30.33 kg/m. General:Pleasant affect, NAD Skin:Warm and dry, brisk capillary refill HEENT:normocephalic, sclera clear, mucus  membranes moist Neck:supple, no JVD, no bruits  Heart:S1S2 RRR with 3/6 systolic murmur, no gallup, rub or click Lungs:clear without rales, rhonchi, or wheezes [id number], non tender, + BS, do not palpate liver spleen or masses Ext:no lower ext edema, 2+ pedal pulses, 2+ radial pulses Neuro:alert and oriented X 3, MAE, follows  commands, + facial symmetry    EKG:  EKG is ordered today. The ekg ordered today demonstrates AV pacing and no acute changes.  *   Recent Labs: 04/12/2016: ALT 15; B Natriuretic Peptide 658.5 04/13/2016: TSH 1.503 04/14/2016: Magnesium 2.3 04/16/2016: BUN 51; Creatinine, Ser 1.77; Hemoglobin 11.8; Platelets 249; Potassium 3.9; Sodium 140    Lipid Panel No results found for: CHOL, TRIG, HDL, CHOLHDL, VLDL, LDLCALC, LDLDIRECT     Other studies Reviewed: Additional studies/ records that were reviewed today include:  Echo 04/13/16 Study Conclusions  - Left ventricle: The cavity size was normal. There was mild   concentric hypertrophy. Systolic function was normal. The   estimated ejection fraction was in the range of 55% to 60%. Wall   motion was normal; there were no regional wall motion   abnormalities. Doppler parameters are consistent with restrictive   physiology, indicative of decreased left ventricular diastolic   compliance and/or increased left atrial pressure. Doppler   parameters are consistent with elevated ventricular end-diastolic   filling pressure. - Aortic valve: There was mild regurgitation. - Aortic root: The aortic root was normal in size. - Ascending aorta: The ascending aorta was normal in size. - Mitral valve: Calcified annulus. Mildly thickened leaflets .   There was mild regurgitation. - Left atrium: The atrium was moderately dilated. - Right ventricle: Pacer wire or catheter noted in right ventricle. - Right atrium: Pacer wire or catheter noted in right atrium. - Tricuspid valve: There was moderate regurgitation. - Pulmonic  valve: There was no regurgitation. - Pulmonary arteries: Systolic pressure was mildly increased. PA   peak pressure: 38 mm Hg (S). - Inferior vena cava: The vessel was normal in size. - Pericardium, extracardiac: There was no pericardial effusion.   ASSESSMENT AND PLAN:  1.  Dyspnea resolved off flecainide - euvolemic today on lower dose of lasix.    2.  Chest pain one brief episode this AM.AV pacing on EKG doubt Cardiac.  She will notify us if any recurrent episodes.  We discussed lexiscan myoview but pt and her family did not wish to do now, discussed that that was easiest way to tell if she was on verge of a heart attack. Certainly if her SOB or chest pain increases we would want to do ASAP.  For now will hold off for 2 weeks and see her back and then plan for for nuc.   3. Hypokalemia will recheck today along with magnesium with her had drawing.  She would like liquid KCL which we did  4.  Anticoagulation check INR today Continue Warfarin for CHADS2VASC of 4  5.  PAF maintaining SR off flecainide.   6. HTN controlled.    Current medicines are reviewed with the patient today.  The patient Has no concerns regarding medicines.  The following changes have been made:  See above Labs/ tests ordered today include:see above  Disposition:   FU:  see above  Signed, Cecilie Kicks, NP  04/23/2016 5:28 PM    [facility] [place], [place], [address] [place], [place] Phone: [phone]; Fax: [phone]

## 2016-04-23 NOTE — Patient Instructions (Addendum)
Medication Instructions:   STOP TAKING POTASSIUM TABLET   START TOMORROW TAKING POTASSIUM LIQUID FORM  20 MEQ / 15ML DAILY   If you need a refill on your cardiac medications before your next appointment, please call your pharmacy.  Labwork: BMET AND AND MAG TODAY    Testing/Procedures: NONE ORDER TODAY    Follow-Up: IN 3 WEEKS WITH AN AVAILABLE  APP  ON  A DAY DR NELSON IN OFFICE    Any Other Special Instructions Will Be Listed Below (If Applicable).

## 2016-04-25 ENCOUNTER — Ambulatory Visit (INDEPENDENT_AMBULATORY_CARE_PROVIDER_SITE_OTHER): Payer: Medicare HMO | Admitting: Internal Medicine

## 2016-04-25 ENCOUNTER — Encounter: Payer: Self-pay | Admitting: Internal Medicine

## 2016-04-25 DIAGNOSIS — I5032 Chronic diastolic (congestive) heart failure: Secondary | ICD-10-CM

## 2016-04-25 DIAGNOSIS — R0600 Dyspnea, unspecified: Secondary | ICD-10-CM | POA: Diagnosis not present

## 2016-04-25 DIAGNOSIS — I482 Chronic atrial fibrillation, unspecified: Secondary | ICD-10-CM

## 2016-04-25 DIAGNOSIS — N189 Chronic kidney disease, unspecified: Secondary | ICD-10-CM

## 2016-04-25 DIAGNOSIS — N289 Disorder of kidney and ureter, unspecified: Secondary | ICD-10-CM

## 2016-04-25 NOTE — Progress Notes (Signed)
   Subjective:    Patient ID: [name] [name], female    DOB: 1931-01-05, 82 y.o.   MRN: [id number]  HPI The patient is an 82 YO female coming in for hospital follow up (in for acute on CKD, some of her medicines were changes due to change in GFR during hospitalization, also for SOB and unclear etiology). Her SOB is some better at this time. She is not feeling back to normal though yet. She is going to have a stress test and echo with cardiology soon to check on her heart. Her pacemaker showed her still in sinus. She denies diarrhea, constipation since leaving the hospital. Denies chest pains. Is not on her UTI prevention medication at this time due to kidney function and she is wondering if she can resume.   PMH, [facility], social history reviewed and updated.   Review of Systems  Constitutional: Positive for activity change and fatigue. Negative for appetite change, chills, fever and unexpected weight change.  HENT: Negative.   Eyes: Negative.   Respiratory: Negative for cough, chest tightness, shortness of breath and wheezing.   Cardiovascular: Positive for leg swelling. Negative for chest pain and palpitations.  Gastrointestinal: Negative for abdominal distention, abdominal pain, constipation, diarrhea and nausea.  Musculoskeletal: Negative.   Skin: Negative.   Neurological: Positive for numbness. Negative for dizziness, syncope, weakness and headaches.  Psychiatric/Behavioral: Negative.       Objective:   Physical Exam  Constitutional: She is oriented to person, place, and time. She appears well-developed and well-nourished.  HENT:  Head: Normocephalic and atraumatic.  Eyes: EOM are normal.  Neck: Normal range of motion.  Cardiovascular: Normal rate and regular rhythm.   Pulmonary/Chest: Effort normal and breath sounds normal. No respiratory distress. She has no wheezes. She has no rales.  Abdominal: Soft. Bowel sounds are normal. She exhibits no distension. There is no tenderness.  There is no rebound.  Musculoskeletal: She exhibits edema.  1+ edema to mid shin bilaterally, wearing compression hose.   Neurological: She is alert and oriented to person, place, and time.  Slow gait, cane  Skin: Skin is warm and dry.  Psychiatric: She has a normal mood and affect.    Vitals:   04/25/16 1535  BP: 118/68  Pulse: 60  Temp: 98.1 F (36.7 C)  SpO2: 90%  Weight: 222 lb (100.7 kg)      Assessment & Plan:

## 2016-04-25 NOTE — Patient Instructions (Signed)
We will not change the medicine today. Call the urologist and see if they will send in bactrim daily for uti prevention instead of the nitrofurantoin since that one is not good with the kidneys.

## 2016-04-26 ENCOUNTER — Encounter: Payer: Self-pay | Admitting: Internal Medicine

## 2016-04-26 NOTE — Assessment & Plan Note (Signed)
She would appear to still have CKD stage 3 at this time and further adjustments will need to be made to her medicines. Will give her another 1-2 months for kidney function to recover and if it remains the same will need to stop metformin. She was advised to not take nitrofurantoin as it is not indicated at her current GFR.

## 2016-04-26 NOTE — Assessment & Plan Note (Signed)
Taking lasix and potassium and appears to be euvolemic today.

## 2016-04-26 NOTE — Assessment & Plan Note (Signed)
In sinus on exam today and still seeing cardiology for management.

## 2016-04-26 NOTE — Assessment & Plan Note (Signed)
Still needs stress and echo to rule out cardiac etiology.

## 2016-05-13 ENCOUNTER — Encounter: Payer: Self-pay | Admitting: Cardiology

## 2016-05-13 ENCOUNTER — Ambulatory Visit (INDEPENDENT_AMBULATORY_CARE_PROVIDER_SITE_OTHER): Payer: Medicare HMO | Admitting: Cardiology

## 2016-05-13 ENCOUNTER — Ambulatory Visit (INDEPENDENT_AMBULATORY_CARE_PROVIDER_SITE_OTHER): Payer: Medicare HMO | Admitting: Pharmacist

## 2016-05-13 VITALS — BP 142/76 | HR 71 | Ht 72.0 in | Wt 222.8 lb

## 2016-05-13 DIAGNOSIS — I482 Chronic atrial fibrillation, unspecified: Secondary | ICD-10-CM

## 2016-05-13 DIAGNOSIS — Z86711 Personal history of pulmonary embolism: Secondary | ICD-10-CM

## 2016-05-13 DIAGNOSIS — R0789 Other chest pain: Secondary | ICD-10-CM

## 2016-05-13 LAB — POCT INR: INR: 3.1

## 2016-05-13 NOTE — Progress Notes (Signed)
05/13/2016 [name] [name]   Mar 15, 1931  [id number]  Primary Physician Hoyt Koch, MD Primary Cardiologist: Dr. Meda Coffee (previously Dr. Ron Parker) Electrophysiologist: Dr. Rayann Heman  Reason for Visit/CC: f/u for PAF + Chest Pain  HPI:  The patient is a 82 y/o female, followed by Dr. Meda Coffee with a hx of PAF, tachybradycardia syndrome status post PPM, diastolic HF, carotid stenosis, prior DVT/pulmonary embolism, HTN, DM2, breast CA. Marland Kitchen She was previously on Fleacinide but recent discontinued. She is on chronic Coumadin for a/c.  She was recently admitted to [facility] for dyspnea and was found to have an elevated BNP at 658, and vascular congestion on CXR.  Dr. Sallyanne Kuster saw her and felt this may be flecainide toxicity.  Her flecainide was stopped. Dyspnea improved. F/u EKGs have shown NSR. She had been scheduled for [facility] but this was planned as outpt. She was also seen by Dr. Rayann Heman and was in NSR. Her pacemaker was interrogated and she did well maintaining SR.   Percent back to clinic today for follow-up. She has continued to do well. She denies any recurrent dyspnea. No recurrent chest pain. She's been fully compliant with her medications. She denies any abnormal bleeding with Coumadin. She denies any symptoms of breakthrough atrial fibrillation including no palpitations, lightheadedness, dizziness, syncope/near-syncope. Physical exam reveals regular rate and rhythm. Pulse rates well controlled in the 70s. She notes that she is now ready to undergo nuclear stress testing. She feels that she has recovered from her recent hospitalization and has the energy to undergo the stress test.   Current Outpatient Prescriptions  Medication Sig Dispense Refill  . ALPRAZolam (XANAX) 0.25 MG tablet Take 1 tablet (0.25 mg total) by mouth daily as needed for anxiety. 30 tablet 0  . bethanechol (URECHOLINE) 25 MG tablet Take 25 mg by mouth daily.    . furosemide (LASIX) 80 MG tablet Take 0.5  tablets (40 mg total) by mouth 2 (two) times daily. 180 tablet 3  . gabapentin (NEURONTIN) 300 MG capsule Take 300 mg by mouth 3 (three) times daily.     Marland Kitchen HYDROcodone-acetaminophen (NORCO/VICODIN) 5-325 MG tablet Take 1 tablet by mouth every 6 (six) hours as needed for moderate pain.    . metFORMIN (GLUCOPHAGE) 500 MG tablet Take 500 mg by mouth 2 (two) times daily with a meal.     . metoprolol succinate (TOPROL-XL) 25 MG 24 hr tablet TAKE 1/2 (12.5 MG) TABLET BY MOUTH DAILY 45 tablet 3  . pyridoxine (B-6) 200 MG tablet Take 200 mg by mouth daily.    . vitamin B-12 (CYANOCOBALAMIN) 500 MCG tablet Take 500 mcg by mouth daily.     Marland Kitchen warfarin (COUMADIN) 5 MG tablet Take 0.5-1 tablets (2.5-5 mg total) by mouth See admin instructions. 2.5 mg daily on Sun/Mon/Tues/Wed/Fri/Sat and 5 mg on Thurs 30 tablet 3   Current Facility-Administered Medications  Medication Dose Route Frequency Provider Last Rate Last Dose  . potassium chloride 20 MEQ/15ML (10%) solution 20 mEq  20 mEq Oral Daily Isaiah Serge, NP        Allergies  Allergen Reactions  . Flecainide Shortness Of Breath    toxicity  . Morphine And Related Other (See Comments)    Family requests no morphine due to past reaction. Pt stated it made her sick.  . Cephalexin Nausea And Vomiting    Social History   Social History  . Marital status: Widowed    Spouse name: N/A  . Number of children: 3  .  Years of education: N/A   Occupational History  . Retired     Social History Main Topics  . Smoking status: Former Smoker    Packs/day: 4.00    Quit date: 10/26/1998  . Smokeless tobacco: Never Used  . Alcohol use No  . Drug use: No  . Sexual activity: Not on file   Other Topics Concern  . Not on file   Social History Narrative   Widowed.  Lives in [place] by herself.  Avoids caffeine.  Very active, taking care of her brother who also lives by himself and is in his late 66's.     Review of Systems: General: negative for chills,  fever, night sweats or weight changes.  Cardiovascular: negative for chest pain, dyspnea on exertion, edema, orthopnea, palpitations, paroxysmal nocturnal dyspnea or shortness of breath Dermatological: negative for rash Respiratory: negative for cough or wheezing Urologic: negative for hematuria Abdominal: negative for nausea, vomiting, diarrhea, bright red blood per rectum, melena, or hematemesis Neurologic: negative for visual changes, syncope, or dizziness All other systems reviewed and are otherwise negative except as noted above.    Blood pressure (!) 142/76, pulse 71, height 6' (1.829 m), weight 222 lb 12.8 oz (101.1 kg), SpO2 94 %.  General appearance: alert, cooperative and no distress Neck: no carotid bruit and no JVD Lungs: clear to auscultation bilaterally Heart: regular rate and rhythm, S1, S2 normal, no murmur, click, rub or gallop Extremities: no LEE Pulses: 2+ and symmetric Skin: warm and dry Neurologic: Grossly normal  EKG not preformed. RRR on exam.   ASSESSMENT AND PLAN:   1. PAF: Flecainide was recently discontinued due to suspected flecainide toxicity. Follow-up EKGs have demonstrated normal sinus rhythm. She denies any signs and symptoms of breakthrough atrial fibrillation. Physical exam today reveals regular rate and rhythm. Pulse rate is well controlled in the low 70s on Metroprolol. She is on chronic warfarin therapy for anticoagulation. She denies any abnormal bleeding or falls with warfarin. She is scheduled for INR check today. Continue metoprolol.   2. Chest Pain: she denies any further recurrence, but has been instructed to get a NST. We will arrange this today.   3. HTN: BP is controlled.   PLAN  F/u with Dr. Meda Coffee in 2-3 months.   Lyda Jester [facility] 05/13/2016 3:38 PM

## 2016-05-13 NOTE — Patient Instructions (Addendum)
Medication Instructions:   Your physician recommends that you continue on your current medications as directed. Please refer to the Current Medication list given to you today.   If you need a refill on your cardiac medications before your next appointment, please call your pharmacy.  Labwork: NONE ORDER TODAY    Testing/Procedures: Your physician has requested that you have a lexiscan myoview. For further information please visit [email or web address]. Please follow instruction sheet, as given.    Follow-Up: IN 2 TO 3 MONTHS WITH DR Meda Coffee    Any Other Special Instructions Will Be Listed Below (If Applicable).

## 2016-06-08 DIAGNOSIS — I5032 Chronic diastolic (congestive) heart failure: Secondary | ICD-10-CM | POA: Diagnosis not present

## 2016-06-08 DIAGNOSIS — R69 Illness, unspecified: Secondary | ICD-10-CM | POA: Diagnosis not present

## 2016-06-08 DIAGNOSIS — Z0000 Encounter for general adult medical examination without abnormal findings: Secondary | ICD-10-CM | POA: Diagnosis not present

## 2016-06-08 DIAGNOSIS — I4891 Unspecified atrial fibrillation: Secondary | ICD-10-CM | POA: Diagnosis not present

## 2016-06-08 DIAGNOSIS — E1142 Type 2 diabetes mellitus with diabetic polyneuropathy: Secondary | ICD-10-CM | POA: Diagnosis not present

## 2016-06-08 DIAGNOSIS — I10 Essential (primary) hypertension: Secondary | ICD-10-CM | POA: Diagnosis not present

## 2016-06-13 ENCOUNTER — Telehealth (HOSPITAL_COMMUNITY): Payer: Self-pay | Admitting: *Deleted

## 2016-06-13 NOTE — Telephone Encounter (Signed)
Patient given detailed instructions per Myocardial Perfusion Study Information Sheet for the test on 06/17/16. Patient notified to arrive 15 minutes early and that it is imperative to arrive on time for appointment to keep from having the test rescheduled.  If you need to cancel or reschedule your appointment, please call the office within 24 hours of your appointment. Failure to do so may result in a cancellation of your appointment, and a $50 no show fee. Patient verbalized understanding. [name] [name]   

## 2016-06-17 ENCOUNTER — Telehealth: Payer: Self-pay | Admitting: Internal Medicine

## 2016-06-17 ENCOUNTER — Ambulatory Visit (HOSPITAL_COMMUNITY): Payer: Medicare HMO | Attending: Cardiology

## 2016-06-17 ENCOUNTER — Other Ambulatory Visit: Payer: Self-pay | Admitting: Cardiology

## 2016-06-17 ENCOUNTER — Ambulatory Visit (INDEPENDENT_AMBULATORY_CARE_PROVIDER_SITE_OTHER): Payer: Medicare HMO | Admitting: Pharmacist

## 2016-06-17 DIAGNOSIS — I482 Chronic atrial fibrillation, unspecified: Secondary | ICD-10-CM

## 2016-06-17 DIAGNOSIS — R079 Chest pain, unspecified: Secondary | ICD-10-CM

## 2016-06-17 DIAGNOSIS — Z86711 Personal history of pulmonary embolism: Secondary | ICD-10-CM | POA: Diagnosis not present

## 2016-06-17 DIAGNOSIS — I510 Cardiac septal defect, acquired: Secondary | ICD-10-CM | POA: Diagnosis not present

## 2016-06-17 LAB — MYOCARDIAL PERFUSION IMAGING
LV dias vol: 81 mL (ref 46–106)
LV sys vol: 25 mL
Peak HR: 74 {beats}/min
RATE: 0.29
Rest HR: 63 {beats}/min
SDS: 6
SRS: 4
SSS: 10
TID: 0.97

## 2016-06-17 LAB — POCT INR: INR: 1.9

## 2016-06-17 MED ORDER — TECHNETIUM TC 99M TETROFOSMIN IV KIT
10.4000 | PACK | Freq: Once | INTRAVENOUS | Status: AC | PRN
  Administered 2016-06-17: 10 via INTRAVENOUS
  Filled 2016-06-17: qty 10

## 2016-06-17 MED ORDER — TECHNETIUM TC 99M TETROFOSMIN IV KIT
31.7000 | PACK | Freq: Once | INTRAVENOUS | Status: AC | PRN
  Administered 2016-06-17: 31.7 via INTRAVENOUS
  Filled 2016-06-17: qty 32

## 2016-06-17 MED ORDER — REGADENOSON 0.4 MG/5ML IV SOLN
0.4000 mg | Freq: Once | INTRAVENOUS | Status: AC
  Administered 2016-06-17: 0.4 mg via INTRAVENOUS

## 2016-06-17 NOTE — Telephone Encounter (Signed)
PLEASE NOTE: All timestamps contained within this report are represented as [place] Standard Time. CONFIDENTIALTY NOTICE: This fax transmission is intended only for the addressee. It contains information that is legally privileged, confidential or otherwise protected from use or disclosure. If you are not the intended recipient, you are strictly prohibited from reviewing, disclosing, copying using or disseminating any of this information or taking any action in reliance on or regarding this information. If you have received this fax in error, please notify us immediately by telephone so that we can arrange for its return to [place]. Phone: [phone], Toll-Free: [phone], Fax: [phone] Page: 1 of 1 Call Id: [id number] [place] [facility] - Client [place] Patient Name: [name] [name] DOB: [birth year] Initial Comment Caller states she is having the feeling that her hands feel like icicle and they hurt. Nurse Assessment Nurse: Dimas Chyle, RN, Dellis Filbert Date/Time Eilene Ghazi Time): 06/17/2016 12:51:55 PM Confirm and document reason for call. If symptomatic, describe symptoms. You must click the next button to save text entered. ---Caller says, her feet have sharp pains, throbbing and numbness, painful to stand. Currently taking gabapentin. Has the patient traveled out of the country within the last 30 days? ---No Does the patient have any new or worsening symptoms? ---Yes Will a triage be completed? ---Yes Related visit to physician within the last 2 weeks? ---No Does the PT have any chronic conditions? (i.e. diabetes, asthma, etc.) ---Yes List chronic conditions. ---Diabetes type 2 Is this a behavioral health or substance abuse call? ---No Guidelines Guideline Title Affirmed Question Affirmed Notes Hand and Wrist Pain Hand or wrist pain is a chronic symptom (recurrent or ongoing AND present > 4 weeks) Final Disposition User See PCP  within 2 Unk Pinto, RN, Dellis Filbert Disagree/Comply: Comply

## 2016-06-19 ENCOUNTER — Ambulatory Visit (INDEPENDENT_AMBULATORY_CARE_PROVIDER_SITE_OTHER): Payer: Medicare HMO | Admitting: Internal Medicine

## 2016-06-19 ENCOUNTER — Encounter: Payer: Self-pay | Admitting: Internal Medicine

## 2016-06-19 VITALS — BP 136/80 | HR 81 | Temp 98.4°F | Resp 14 | Ht 72.0 in | Wt 223.0 lb

## 2016-06-19 DIAGNOSIS — E1142 Type 2 diabetes mellitus with diabetic polyneuropathy: Secondary | ICD-10-CM

## 2016-06-19 DIAGNOSIS — G609 Hereditary and idiopathic neuropathy, unspecified: Secondary | ICD-10-CM

## 2016-06-19 MED ORDER — ALPRAZOLAM 0.25 MG PO TABS: 0.2500 mg | ORAL_TABLET | Freq: Every day | ORAL | 1 refills | Status: DC | PRN

## 2016-06-19 NOTE — Progress Notes (Signed)
   Subjective:    Patient ID: [name] [name], female    DOB: [birth year], 82 y.o.   MRN: [id number]  HPI The patient is an 82 YO female coming in for hand pain. She thinks it is related to her neuropathy and is unbearable at night time. She is taking gabapentin 300 mg TID but this does not provide relief to her. She does take daily B vitamins. She does not have as many problems in the morning and afternoon but the night time and evening is bad. She feels it is worsening and the gabapentin is not working as well. No changes to diet or exercise.   Review of Systems  Constitutional: Positive for activity change. Negative for appetite change, chills, fatigue, fever and unexpected weight change.  Respiratory: Negative for cough, chest tightness, shortness of breath and wheezing.   Cardiovascular: Negative for chest pain, palpitations and leg swelling.  Gastrointestinal: Negative for abdominal distention, abdominal pain, constipation, diarrhea and nausea.  Musculoskeletal: Positive for myalgias.  Skin: Negative.   Neurological: Positive for numbness. Negative for dizziness, syncope, weakness and headaches.  Psychiatric/Behavioral: Negative.       Objective:   Physical Exam  Constitutional: She is oriented to person, place, and time. She appears well-developed and well-nourished.  HENT:  Head: Normocephalic and atraumatic.  Eyes: EOM are normal.  Neck: Normal range of motion.  Cardiovascular: Normal rate and regular rhythm.   Pulmonary/Chest: Effort normal and breath sounds normal. No respiratory distress. She has no wheezes. She has no rales.  Abdominal: Soft. She exhibits no distension. There is no tenderness. There is no rebound.  Neurological: She is alert and oriented to person, place, and time.  Slow gait, cane  Skin: Skin is warm and dry.   Vitals:   06/19/16 0804  BP: 136/80  Pulse: 81  Resp: 14  Temp: 98.4 F (36.9 C)  TempSrc: Oral  SpO2: 96%  Weight: 223 lb (101.2 kg)    Height: 6' (1.829 m)      Assessment & Plan:

## 2016-06-19 NOTE — Progress Notes (Signed)
Pre visit review using our clinic review tool, if applicable. No additional management support is needed unless otherwise documented below in the visit note. 

## 2016-06-19 NOTE — Patient Instructions (Signed)
We are checking the labs today and will call you back with the results.   Try taking 2 pills of the gabapentin in the evening and keep taking 1 pill of gabapentin in the morning and afternoon.   If this does not help in the next 2-3 days call us back and we will try the medicine called lyrica which is also for the nerve pain.

## 2016-06-19 NOTE — Assessment & Plan Note (Signed)
Likely partially from diabetes and from her hx breast cancer. She is taking gabapentin 300 mg TID which is sufficient for daytime. Will increase evening dose to 600 mg. Checking TSH, free T4, and B12, CBC, CMP to rule out treatable cause for progression. If no relief from the increased dose we can switch to lyrica.

## 2016-06-26 DIAGNOSIS — M150 Primary generalized (osteo)arthritis: Secondary | ICD-10-CM | POA: Diagnosis not present

## 2016-06-26 DIAGNOSIS — I10 Essential (primary) hypertension: Secondary | ICD-10-CM | POA: Diagnosis not present

## 2016-06-26 DIAGNOSIS — E1165 Type 2 diabetes mellitus with hyperglycemia: Secondary | ICD-10-CM | POA: Diagnosis not present

## 2016-07-15 ENCOUNTER — Ambulatory Visit (INDEPENDENT_AMBULATORY_CARE_PROVIDER_SITE_OTHER): Payer: Medicare HMO | Admitting: *Deleted

## 2016-07-15 ENCOUNTER — Telehealth: Payer: Self-pay | Admitting: *Deleted

## 2016-07-15 DIAGNOSIS — I482 Chronic atrial fibrillation, unspecified: Secondary | ICD-10-CM

## 2016-07-15 DIAGNOSIS — Z86711 Personal history of pulmonary embolism: Secondary | ICD-10-CM | POA: Diagnosis not present

## 2016-07-15 LAB — POCT INR: INR: 2.4

## 2016-07-15 NOTE — Telephone Encounter (Signed)
I have only prescribed this for her 1 time so she would need visit for evaluation.

## 2016-07-15 NOTE — Telephone Encounter (Signed)
Pt left msg on triage requesting refill on her Hydrocodone...Johny Chess

## 2016-07-16 NOTE — Telephone Encounter (Signed)
Tried calling pt no answer x's 10 rings...[name] [name]

## 2016-07-17 NOTE — Telephone Encounter (Signed)
Notified pt w/MD response. Already have an appt set-up w/MD will discuss at ov...[name] [name]

## 2016-07-25 ENCOUNTER — Other Ambulatory Visit: Payer: Self-pay | Admitting: Physician Assistant

## 2016-07-25 NOTE — Telephone Encounter (Signed)
furosemide (LASIX) 80 MG tablet  Medication  Date: 04/16/2016 Department: [facility] TELEMETRY/UROLOGY [facility] Ordering/Authorizing: Florencia Reasons, MD  Order Providers   Prescribing Provider Encounter Provider  Florencia Reasons, MD None  Medication Detail    Disp Refills Start End   furosemide (LASIX) 80 MG tablet 180 tablet 3 04/16/2016    Sig - Route: Take 0.5 tablets (40 mg total) by mouth 2 (two) times daily. - Oral   Notes to Pharmacy: DOSE INCREASE   E-Prescribing Status: Receipt confirmed by pharmacy (04/16/2016 9:59 AM EDT)   Pharmacy   [id number] - [place], [place] [address]

## 2016-07-26 ENCOUNTER — Encounter: Payer: Self-pay | Admitting: Internal Medicine

## 2016-07-26 ENCOUNTER — Ambulatory Visit (INDEPENDENT_AMBULATORY_CARE_PROVIDER_SITE_OTHER): Payer: Medicare HMO | Admitting: Internal Medicine

## 2016-07-26 VITALS — BP 132/68 | HR 84 | Temp 98.7°F | Resp 12 | Ht 72.0 in | Wt 230.0 lb

## 2016-07-26 DIAGNOSIS — G8929 Other chronic pain: Secondary | ICD-10-CM

## 2016-07-26 DIAGNOSIS — R300 Dysuria: Secondary | ICD-10-CM | POA: Diagnosis not present

## 2016-07-26 DIAGNOSIS — M545 Low back pain: Secondary | ICD-10-CM

## 2016-07-26 LAB — POCT URINALYSIS DIPSTICK
BILIRUBIN UA: NEGATIVE
GLUCOSE UA: NEGATIVE
KETONES UA: NEGATIVE
NITRITE UA: POSITIVE
PH UA: 6
Protein, UA: POSITIVE
RBC UA: POSITIVE
Spec Grav, UA: 1.025
Urobilinogen, UA: NEGATIVE

## 2016-07-26 MED ORDER — NITROFURANTOIN MONOHYD MACRO 100 MG PO CAPS: 100.0000 mg | ORAL_CAPSULE | Freq: Two times a day (BID) | ORAL | 0 refills | Status: DC

## 2016-07-26 MED ORDER — HYDROCODONE-ACETAMINOPHEN 5-325 MG PO TABS: 0.5000 | ORAL_TABLET | Freq: Two times a day (BID) | ORAL | 0 refills | Status: DC | PRN

## 2016-07-26 NOTE — Patient Instructions (Addendum)
We have checked the urine and you do have an infection. We have sent in macrobid for the infection. Take 1 pill twice a day for 1 week.   We have also given you the hydrocodone medicine for when needed.

## 2016-07-26 NOTE — Progress Notes (Signed)
Pre visit review using our clinic review tool, if applicable. No additional management support is needed unless otherwise documented below in the visit note. 

## 2016-07-28 DIAGNOSIS — G8929 Other chronic pain: Secondary | ICD-10-CM | POA: Insufficient documentation

## 2016-07-28 DIAGNOSIS — M549 Dorsalgia, unspecified: Secondary | ICD-10-CM

## 2016-07-28 NOTE — Progress Notes (Signed)
   Subjective:    Patient ID: [name] [name], female    DOB: Sep 04, 1931, 82 y.o.   MRN: [id number]  HPI The patient is an 82 YO female coming in for lower back pain which is chronic. She had an old rx for hydrocodone which she has used rarely (20 pills from 2013) and was wanting a refill. She was given an rx in March but did not fill (confirmed by [place] narcotic database). She tries to avoid medication but sometimes does something extra and will use 1 pill to help get her moving again so she can feel better. No recent falls. No numbness or weakness in her legs. She did more exertion and this is why she is hurting more right now. 6/10 instead of her usual 2-3/10. No recent weight change.  Review of Systems  Constitutional: Positive for activity change. Negative for appetite change, fatigue, fever and unexpected weight change.  Respiratory: Negative.   Cardiovascular: Negative.   Gastrointestinal: Negative.   Musculoskeletal: Positive for back pain. Negative for arthralgias, gait problem and myalgias.  Skin: Negative.   Neurological: Negative.       Objective:   Physical Exam  Constitutional: She appears well-developed and well-nourished.  HENT:  Head: Normocephalic and atraumatic.  Eyes: EOM are normal.  Neck: Normal range of motion.  Cardiovascular: Normal rate and regular rhythm.   Pulmonary/Chest: Effort normal and breath sounds normal.  Abdominal: Soft. She exhibits no distension. There is no tenderness. There is no rebound.  Musculoskeletal:  Pain 6-7/10 in the lumbar spinal region.    Vitals:   07/26/16 1523  BP: 132/68  Pulse: 84  Resp: 12  Temp: 98.7 F (37.1 C)  TempSrc: Oral  SpO2: 96%  Weight: 230 lb (104.3 kg)  Height: 6' (1.829 m)      Assessment & Plan:

## 2016-07-28 NOTE — Assessment & Plan Note (Addendum)
No red flag to suggest need for new imaging. Rx for hydrocodone small quantity as she has used less than 20 in 4 years. Typically uses OTC meds if needed. Counseled her on the risks and harms with opioid usage and common side effects such as constipation, confusion, sleepiness and was advised not to drive or operate machinery after taking.

## 2016-08-05 ENCOUNTER — Telehealth: Payer: Self-pay | Admitting: Internal Medicine

## 2016-08-05 DIAGNOSIS — R300 Dysuria: Secondary | ICD-10-CM

## 2016-08-05 NOTE — Telephone Encounter (Signed)
Pt called in said that her uti has not cleared up and wants to know she needs another round of the meds?

## 2016-08-05 NOTE — Telephone Encounter (Signed)
She can come leave sample to be tested.

## 2016-08-05 NOTE — Telephone Encounter (Signed)
Left message informing patient that she can go leave a sample in the lab.

## 2016-08-12 ENCOUNTER — Ambulatory Visit (INDEPENDENT_AMBULATORY_CARE_PROVIDER_SITE_OTHER): Payer: Medicare HMO

## 2016-08-12 DIAGNOSIS — Z86711 Personal history of pulmonary embolism: Secondary | ICD-10-CM

## 2016-08-12 DIAGNOSIS — I482 Chronic atrial fibrillation, unspecified: Secondary | ICD-10-CM

## 2016-08-12 LAB — POCT INR: INR: 2.7

## 2016-09-02 ENCOUNTER — Other Ambulatory Visit: Payer: Self-pay | Admitting: Internal Medicine

## 2016-09-02 MED ORDER — HYDROCODONE-ACETAMINOPHEN 5-325 MG PO TABS: 0.5000 | ORAL_TABLET | Freq: Two times a day (BID) | ORAL | 0 refills | Status: DC | PRN

## 2016-09-02 NOTE — Telephone Encounter (Signed)
Notified pt rx ready for pick-up.../lmb 

## 2016-09-09 ENCOUNTER — Ambulatory Visit (INDEPENDENT_AMBULATORY_CARE_PROVIDER_SITE_OTHER): Payer: Medicare HMO | Admitting: *Deleted

## 2016-09-09 DIAGNOSIS — Z86711 Personal history of pulmonary embolism: Secondary | ICD-10-CM

## 2016-09-09 DIAGNOSIS — I482 Chronic atrial fibrillation, unspecified: Secondary | ICD-10-CM

## 2016-09-09 DIAGNOSIS — I4891 Unspecified atrial fibrillation: Secondary | ICD-10-CM | POA: Diagnosis not present

## 2016-09-09 LAB — POCT INR: INR: 2.7

## 2016-10-02 DIAGNOSIS — L851 Acquired keratosis [keratoderma] palmaris et plantaris: Secondary | ICD-10-CM | POA: Diagnosis not present

## 2016-10-02 DIAGNOSIS — B351 Tinea unguium: Secondary | ICD-10-CM | POA: Diagnosis not present

## 2016-10-02 DIAGNOSIS — E1142 Type 2 diabetes mellitus with diabetic polyneuropathy: Secondary | ICD-10-CM | POA: Diagnosis not present

## 2016-10-02 DIAGNOSIS — L97521 Non-pressure chronic ulcer of other part of left foot limited to breakdown of skin: Secondary | ICD-10-CM | POA: Diagnosis not present

## 2016-10-08 ENCOUNTER — Inpatient Hospital Stay (HOSPITAL_COMMUNITY): Admission: RE | Admit: 2016-10-08 | Payer: Medicare HMO | Source: Ambulatory Visit

## 2016-10-08 DIAGNOSIS — E1142 Type 2 diabetes mellitus with diabetic polyneuropathy: Secondary | ICD-10-CM | POA: Diagnosis not present

## 2016-10-08 DIAGNOSIS — M25572 Pain in left ankle and joints of left foot: Secondary | ICD-10-CM | POA: Diagnosis not present

## 2016-10-08 DIAGNOSIS — I872 Venous insufficiency (chronic) (peripheral): Secondary | ICD-10-CM | POA: Diagnosis not present

## 2016-10-08 DIAGNOSIS — M545 Low back pain: Secondary | ICD-10-CM | POA: Diagnosis not present

## 2016-10-10 ENCOUNTER — Ambulatory Visit (INDEPENDENT_AMBULATORY_CARE_PROVIDER_SITE_OTHER): Payer: Medicare HMO | Admitting: Internal Medicine

## 2016-10-10 ENCOUNTER — Encounter: Payer: Self-pay | Admitting: Internal Medicine

## 2016-10-10 VITALS — BP 136/76 | HR 79 | Temp 98.1°F | Resp 16 | Ht 72.0 in | Wt 232.0 lb

## 2016-10-10 DIAGNOSIS — M545 Low back pain, unspecified: Secondary | ICD-10-CM

## 2016-10-10 DIAGNOSIS — G8929 Other chronic pain: Secondary | ICD-10-CM

## 2016-10-10 DIAGNOSIS — R300 Dysuria: Secondary | ICD-10-CM | POA: Diagnosis not present

## 2016-10-10 LAB — POC URINALSYSI DIPSTICK (AUTOMATED)
Bilirubin, UA: NEGATIVE
GLUCOSE UA: NEGATIVE
KETONES UA: NEGATIVE
Nitrite, UA: POSITIVE
Protein, UA: NEGATIVE
RBC UA: NEGATIVE
SPEC GRAV UA: 1.015
Urobilinogen, UA: NEGATIVE
pH, UA: 7

## 2016-10-10 MED ORDER — NITROFURANTOIN MONOHYD MACRO 100 MG PO CAPS: 100.0000 mg | ORAL_CAPSULE | Freq: Two times a day (BID) | ORAL | 0 refills | Status: DC

## 2016-10-10 MED ORDER — HYDROCODONE-ACETAMINOPHEN 5-325 MG PO TABS: 0.5000 | ORAL_TABLET | Freq: Two times a day (BID) | ORAL | 0 refills | Status: DC | PRN

## 2016-10-10 NOTE — Progress Notes (Signed)
Pre visit review using our clinic review tool, if applicable. No additional management support is needed unless otherwise documented below in the visit note. 
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
## 2016-10-10 NOTE — Patient Instructions (Signed)
We have sent in macrobid to treat the urine. Take 1 pill twice a day for 1 week to clear this. We have also given you refill of the pain medicine.

## 2016-10-11 NOTE — Assessment & Plan Note (Signed)
U/A done at the visit which shows some signs of infection. Rx for macrobid for treatment.

## 2016-10-11 NOTE — Assessment & Plan Note (Signed)
Refill on her hydrocodone today during the visit.

## 2016-10-14 ENCOUNTER — Emergency Department (HOSPITAL_COMMUNITY): Payer: Medicare HMO

## 2016-10-14 ENCOUNTER — Encounter (HOSPITAL_COMMUNITY): Payer: Medicare HMO

## 2016-10-14 ENCOUNTER — Encounter (HOSPITAL_COMMUNITY): Payer: Self-pay | Admitting: *Deleted

## 2016-10-14 ENCOUNTER — Observation Stay (HOSPITAL_COMMUNITY)
Admission: EM | Admit: 2016-10-14 | Discharge: 2016-10-15 | Disposition: A | Discharge status: Home / Self Care | Payer: Medicare HMO | Attending: Internal Medicine | Admitting: Internal Medicine

## 2016-10-14 ENCOUNTER — Inpatient Hospital Stay (HOSPITAL_COMMUNITY): Payer: Medicare HMO

## 2016-10-14 DIAGNOSIS — Z8249 Family history of ischemic heart disease and other diseases of the circulatory system: Secondary | ICD-10-CM | POA: Insufficient documentation

## 2016-10-14 DIAGNOSIS — I6529 Occlusion and stenosis of unspecified carotid artery: Secondary | ICD-10-CM | POA: Insufficient documentation

## 2016-10-14 DIAGNOSIS — I340 Nonrheumatic mitral (valve) insufficiency: Secondary | ICD-10-CM | POA: Insufficient documentation

## 2016-10-14 DIAGNOSIS — I5032 Chronic diastolic (congestive) heart failure: Secondary | ICD-10-CM | POA: Diagnosis not present

## 2016-10-14 DIAGNOSIS — I4891 Unspecified atrial fibrillation: Secondary | ICD-10-CM | POA: Diagnosis present

## 2016-10-14 DIAGNOSIS — I110 Hypertensive heart disease with heart failure: Secondary | ICD-10-CM | POA: Insufficient documentation

## 2016-10-14 DIAGNOSIS — R519 Headache, unspecified: Secondary | ICD-10-CM

## 2016-10-14 DIAGNOSIS — I480 Paroxysmal atrial fibrillation: Principal | ICD-10-CM | POA: Insufficient documentation

## 2016-10-14 DIAGNOSIS — M8580 Other specified disorders of bone density and structure, unspecified site: Secondary | ICD-10-CM | POA: Insufficient documentation

## 2016-10-14 DIAGNOSIS — R51 Headache: Secondary | ICD-10-CM | POA: Diagnosis not present

## 2016-10-14 DIAGNOSIS — N390 Urinary tract infection, site not specified: Secondary | ICD-10-CM | POA: Diagnosis not present

## 2016-10-14 DIAGNOSIS — Z86718 Personal history of other venous thrombosis and embolism: Secondary | ICD-10-CM | POA: Diagnosis not present

## 2016-10-14 DIAGNOSIS — Z7901 Long term (current) use of anticoagulants: Secondary | ICD-10-CM | POA: Insufficient documentation

## 2016-10-14 DIAGNOSIS — Z950 Presence of cardiac pacemaker: Secondary | ICD-10-CM | POA: Insufficient documentation

## 2016-10-14 DIAGNOSIS — E119 Type 2 diabetes mellitus without complications: Secondary | ICD-10-CM | POA: Diagnosis not present

## 2016-10-14 DIAGNOSIS — S098XXA Other specified injuries of head, initial encounter: Secondary | ICD-10-CM | POA: Diagnosis not present

## 2016-10-14 DIAGNOSIS — Z7984 Long term (current) use of oral hypoglycemic drugs: Secondary | ICD-10-CM | POA: Insufficient documentation

## 2016-10-14 DIAGNOSIS — R791 Abnormal coagulation profile: Secondary | ICD-10-CM

## 2016-10-14 DIAGNOSIS — T1490XA Injury, unspecified, initial encounter: Secondary | ICD-10-CM

## 2016-10-14 DIAGNOSIS — R69 Illness, unspecified: Secondary | ICD-10-CM | POA: Diagnosis not present

## 2016-10-14 DIAGNOSIS — Z86010 Personal history of colonic polyps: Secondary | ICD-10-CM | POA: Diagnosis not present

## 2016-10-14 DIAGNOSIS — R0609 Other forms of dyspnea: Secondary | ICD-10-CM | POA: Diagnosis not present

## 2016-10-14 DIAGNOSIS — F05 Delirium due to known physiological condition: Secondary | ICD-10-CM

## 2016-10-14 DIAGNOSIS — Z87891 Personal history of nicotine dependence: Secondary | ICD-10-CM | POA: Diagnosis not present

## 2016-10-14 DIAGNOSIS — Z853 Personal history of malignant neoplasm of breast: Secondary | ICD-10-CM | POA: Diagnosis not present

## 2016-10-14 DIAGNOSIS — K449 Diaphragmatic hernia without obstruction or gangrene: Secondary | ICD-10-CM | POA: Diagnosis not present

## 2016-10-14 DIAGNOSIS — Z8719 Personal history of other diseases of the digestive system: Secondary | ICD-10-CM | POA: Insufficient documentation

## 2016-10-14 DIAGNOSIS — I483 Typical atrial flutter: Secondary | ICD-10-CM | POA: Diagnosis not present

## 2016-10-14 DIAGNOSIS — Z86711 Personal history of pulmonary embolism: Secondary | ICD-10-CM | POA: Diagnosis not present

## 2016-10-14 DIAGNOSIS — R0602 Shortness of breath: Secondary | ICD-10-CM | POA: Diagnosis not present

## 2016-10-14 DIAGNOSIS — I272 Pulmonary hypertension, unspecified: Secondary | ICD-10-CM | POA: Diagnosis not present

## 2016-10-14 DIAGNOSIS — R4189 Other symptoms and signs involving cognitive functions and awareness: Secondary | ICD-10-CM

## 2016-10-14 DIAGNOSIS — I4892 Unspecified atrial flutter: Secondary | ICD-10-CM | POA: Insufficient documentation

## 2016-10-14 DIAGNOSIS — E1151 Type 2 diabetes mellitus with diabetic peripheral angiopathy without gangrene: Secondary | ICD-10-CM | POA: Diagnosis not present

## 2016-10-14 HISTORY — DX: Low back pain: M54.5

## 2016-10-14 HISTORY — DX: Presence of cardiac pacemaker: Z95.0

## 2016-10-14 HISTORY — DX: Low back pain, unspecified: M54.50

## 2016-10-14 HISTORY — DX: Other chronic pain: G89.29

## 2016-10-14 HISTORY — DX: Anxiety disorder, unspecified: F41.9

## 2016-10-14 HISTORY — DX: Type 2 diabetes mellitus without complications: E11.9

## 2016-10-14 LAB — CBC WITH DIFFERENTIAL/PLATELET
BASOS ABS: 0.1 10*3/uL (ref 0.0–0.1)
BASOS PCT: 1 %
EOS ABS: 0 10*3/uL (ref 0.0–0.7)
EOS PCT: 0 %
HCT: 34.6 % — ABNORMAL LOW (ref 36.0–46.0)
Hemoglobin: 11.3 g/dL — ABNORMAL LOW (ref 12.0–15.0)
LYMPHS PCT: 8 %
Lymphs Abs: 0.5 10*3/uL — ABNORMAL LOW (ref 0.7–4.0)
MCH: 29.7 pg (ref 26.0–34.0)
MCHC: 32.7 g/dL (ref 30.0–36.0)
MCV: 90.8 fL (ref 78.0–100.0)
MONO ABS: 0.4 10*3/uL (ref 0.1–1.0)
Monocytes Relative: 6 %
Neutro Abs: 5.2 10*3/uL (ref 1.7–7.7)
Neutrophils Relative %: 85 %
PLATELETS: 311 10*3/uL (ref 150–400)
RBC: 3.81 MIL/uL — ABNORMAL LOW (ref 3.87–5.11)
RDW: 13.9 % (ref 11.5–15.5)
WBC: 6.1 10*3/uL (ref 4.0–10.5)

## 2016-10-14 LAB — I-STAT TROPONIN, ED
TROPONIN I, POC: 0.01 ng/mL (ref 0.00–0.08)
TROPONIN I, POC: 0.02 ng/mL (ref 0.00–0.08)
Troponin i, poc: 0 ng/mL (ref 0.00–0.08)

## 2016-10-14 LAB — COMPREHENSIVE METABOLIC PANEL
ALT: 13 U/L — ABNORMAL LOW (ref 14–54)
AST: 23 U/L (ref 15–41)
Albumin: 3.1 g/dL — ABNORMAL LOW (ref 3.5–5.0)
Alkaline Phosphatase: 118 U/L (ref 38–126)
Anion gap: 11 (ref 5–15)
BUN: 17 mg/dL (ref 6–20)
CHLORIDE: 105 mmol/L (ref 101–111)
CO2: 23 mmol/L (ref 22–32)
Calcium: 9.1 mg/dL (ref 8.9–10.3)
Creatinine, Ser: 1.3 mg/dL — ABNORMAL HIGH (ref 0.44–1.00)
GFR calc Af Amer: 43 mL/min — ABNORMAL LOW (ref >60)
GFR calc non Af Amer: 37 mL/min — ABNORMAL LOW (ref >60)
Glucose, Bld: 206 mg/dL — ABNORMAL HIGH (ref 65–99)
POTASSIUM: 4 mmol/L (ref 3.5–5.1)
SODIUM: 139 mmol/L (ref 135–145)
Total Bilirubin: 0.9 mg/dL (ref 0.3–1.2)
Total Protein: 6.9 g/dL (ref 6.5–8.1)

## 2016-10-14 LAB — BRAIN NATRIURETIC PEPTIDE: B NATRIURETIC PEPTIDE 5: 160.1 pg/mL — AB (ref 0.0–100.0)

## 2016-10-14 LAB — GLUCOSE, CAPILLARY: Glucose-Capillary: 121 mg/dL — ABNORMAL HIGH (ref 65–99)

## 2016-10-14 LAB — PROTIME-INR
INR: 1.9
PROTHROMBIN TIME: 22.1 s — AB (ref 11.4–15.2)

## 2016-10-14 LAB — PROCALCITONIN

## 2016-10-14 LAB — TYPE AND SCREEN
ABO/RH(D): B NEG
ANTIBODY SCREEN: NEGATIVE

## 2016-10-14 LAB — LACTIC ACID, PLASMA: LACTIC ACID, VENOUS: 1 mmol/L (ref 0.5–1.9)

## 2016-10-14 LAB — ABO/RH: ABO/RH(D): B NEG

## 2016-10-14 MED ORDER — ACETAMINOPHEN 325 MG PO TABS
650.0000 mg | ORAL_TABLET | Freq: Once | ORAL | Status: AC
  Administered 2016-10-14: 650 mg via ORAL
  Filled 2016-10-14: qty 2

## 2016-10-14 MED ORDER — DOXYCYCLINE HYCLATE 100 MG PO TABS: 100.0000 mg | ORAL_TABLET | Freq: Two times a day (BID) | ORAL | Status: DC

## 2016-10-14 MED ORDER — SODIUM CHLORIDE 0.9% FLUSH
3.0000 mL | Freq: Two times a day (BID) | INTRAVENOUS | Status: DC
  Administered 2016-10-15: 3 mL via INTRAVENOUS

## 2016-10-14 MED ORDER — METOPROLOL TARTRATE 5 MG/5ML IV SOLN
5.0000 mg | Freq: Four times a day (QID) | INTRAVENOUS | Status: DC | PRN
  Administered 2016-10-15: 5 mg via INTRAVENOUS
  Filled 2016-10-14: qty 5

## 2016-10-14 MED ORDER — LEVOFLOXACIN 500 MG PO TABS
250.0000 mg | ORAL_TABLET | Freq: Every evening | ORAL | Status: DC
  Administered 2016-10-14 – 2016-10-15 (×2): 250 mg via ORAL
  Filled 2016-10-14 (×2): qty 1

## 2016-10-14 MED ORDER — INSULIN ASPART 100 UNIT/ML ~~LOC~~ SOLN
0.0000 [IU] | Freq: Three times a day (TID) | SUBCUTANEOUS | Status: DC
  Administered 2016-10-15: 3 [IU] via SUBCUTANEOUS

## 2016-10-14 MED ORDER — METOPROLOL SUCCINATE ER 25 MG PO TB24
12.5000 mg | ORAL_TABLET | Freq: Every day | ORAL | Status: DC
  Administered 2016-10-15: 12.5 mg via ORAL
  Filled 2016-10-14: qty 1

## 2016-10-14 MED ORDER — HEPARIN BOLUS VIA INFUSION
4000.0000 [IU] | Freq: Once | INTRAVENOUS | Status: AC
  Administered 2016-10-14: 4000 [IU] via INTRAVENOUS
  Filled 2016-10-14: qty 4000

## 2016-10-14 MED ORDER — DILTIAZEM HCL 25 MG/5ML IV SOLN
15.0000 mg | Freq: Once | INTRAVENOUS | Status: AC
  Administered 2016-10-14: 15 mg via INTRAVENOUS
  Filled 2016-10-14: qty 5

## 2016-10-14 MED ORDER — VITAMIN B-12 1000 MCG PO TABS
500.0000 ug | ORAL_TABLET | Freq: Every day | ORAL | Status: DC
  Administered 2016-10-14 – 2016-10-15 (×2): 500 ug via ORAL
  Filled 2016-10-14 (×2): qty 1

## 2016-10-14 MED ORDER — VITAMIN B-6 100 MG PO TABS
200.0000 mg | ORAL_TABLET | Freq: Every day | ORAL | Status: DC
  Administered 2016-10-14 – 2016-10-15 (×2): 200 mg via ORAL
  Filled 2016-10-14 (×2): qty 2

## 2016-10-14 MED ORDER — HEPARIN (PORCINE) IN NACL 100-0.45 UNIT/ML-% IJ SOLN
1600.0000 [IU]/h | INTRAMUSCULAR | Status: DC
  Administered 2016-10-14 – 2016-10-15 (×2): 1600 [IU]/h via INTRAVENOUS
  Filled 2016-10-14 (×2): qty 250

## 2016-10-14 NOTE — ED Notes (Signed)
Pt appears to be resting comfortably at this time.

## 2016-10-14 NOTE — Progress Notes (Signed)
Pharmacy - Levaquin for UTI  A/P: 83 y/o female to begin Levaquin for UTI. SCr is 1.3, est CrCl 44 ml/min. WBC are normal, Tm 99.6, no microdata available.  Levaquin 250 mg PO qpm Pharmacy signing off  Renold Genta, PharmD, BCPS Clinical Pharmacist Phone for tonight - [place] - [phone] 10/14/2016 7:51 PM
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
## 2016-10-14 NOTE — Progress Notes (Signed)
ANTICOAGULATION CONSULT NOTE - Initial Consult  Pharmacy Consult for Heparin Indication: atrial fibrillation  Allergies  Allergen Reactions  . Flecainide Shortness Of Breath    toxicity  . Morphine And Related Other (See Comments)    Family requests no morphine due to past reaction. Pt stated it made her sick.  . Cephalexin Nausea And Vomiting    Patient Measurements:   Heparin Dosing Weight: 95 kg  Vital Signs: Temp: 99.6 F (37.6 C) (01/15 1705) Temp Source: Oral (01/15 1705) BP: 149/79 (01/15 1705) Pulse Rate: 118 (01/15 1705)  Labs:  Recent Labs  10/14/16 1000 10/14/16 1130 10/14/16 1618  HGB QUESTIONABLE RESULTS, RECOMMEND RECOLLECT TO VERIFY 11.3*  --   HCT QUESTIONABLE RESULTS, RECOMMEND RECOLLECT TO VERIFY 34.6*  --   PLT QUESTIONABLE RESULTS, RECOMMEND RECOLLECT TO VERIFY 311  --   LABPROT  --   --  22.1*  INR  --   --  1.90  CREATININE QUESTIONABLE RESULTS, RECOMMEND RECOLLECT TO VERIFY 1.30*  --     Estimated Creatinine Clearance: 44.5 mL/min (by C-G formula based on SCr of 1.3 mg/dL (H)).   Medical History: Past Medical History:  Diagnosis Date  . Acute gastric ulcer without mention of hemorrhage, perforation, or obstruction 2006   EGD  . Atrial fibrillation (HCC)    flecainide; coumadin  . Breast cancer ([place])   . Carotid artery disease ([place])    67/20: RICA 9-47%, LICA 09-62%  //  Carotiud [place] 83/66: RICA 2-94%; LICA 76-54% >> FU 1 year   . Chronic diastolic heart failure (HCC)    a. echo 10/10: mild LVH, EF 55-60%, mild AI, mild MR, mild to mod LAE, mild RVE, severe RAE, mod TR, PASP 53  /  b.  Echo 3/14:  Mild LVH, EF 55%, Tr AI, MAC, mild MR, mod LAE, PASP 31, trivial eff. // c. echo 1/17: EF 55-60%, mild AI, MAC, mild MR, moderate BAE, moderate TR  . Diabetes mellitus   . Diabetic peripheral neuropathy ([place]) 03/09/2015  . Diverticulosis   . Esophagitis, unspecified 2006   EGD  . Hiatal hernia 2006   EGD  . History of nuclear stress test    a. Myoview 2/12: EF 69%, no scar or ischemia  . Hx of adenomatous colonic polyps   . Hypertension   . Mediastinal lymphadenopathy   . Mitral regurgitation    mild, echo, October, 2010  . Osteopenia   . Pulmonary embolus [facility]) 2006   2006, with DVT  . Pulmonary HTN    53 mmHg echo, 2010, moderate TR, mild right ventricular enlargement  . S/P laminectomy    June 22, 2011  . Tachy-brady syndrome ([place])    s/p pacer 07/2009  . Thyroid nodule    non-neoplastic goiter  . Venous insufficiency    Chronic    Medications:   (Not in a hospital admission) Scheduled:  Infusions:   Assessment: 83yo female with history of Afib on warfarin PTA presents with generalized weakness and near syncope. Pharmacy is consulted to dose heparin for atrial fibrillation.   Goal of Therapy:  Heparin level 0.3-0.7 units/ml Monitor platelets by anticoagulation protocol: Yes   Plan:  Give 4000 units bolus x 1 Start heparin infusion at 1600 units/hr Check anti-Xa level in 8 hours and daily while on heparin Continue to monitor H&H and platelets  Andrey Cota. Diona Foley, PharmD, BCPS Clinical Pharmacist Pager [phone] 10/14/2016,5:28 PM

## 2016-10-14 NOTE — ED Provider Notes (Signed)
[facility] DEPT Provider Note   CSN: [id number] Arrival date & time: 10/14/16  0941     History   Chief Complaint Chief Complaint  Patient presents with  . Near Syncope    HPI [name] [name] is a 83 y.o. female.  HPI Patient presents to the emergency department today with complaints of generalized weakness and lightheadedness and near syncope.  She did not pass out.  She reports shortness of breath.  She denies palpitations.  She presents with 21 atrial flutter.  She has a history of paroxysmal atrial fibrillation.  She is on Coumadin and compliant with her medications.  She does have a pacemaker.  She denies fevers or chills.  Denies productive cough.  No dysuria or urinary frequency.  She reports normal hydration and appetite as of lately.   Past Medical History:  Diagnosis Date  . Acute gastric ulcer without mention of hemorrhage, perforation, or obstruction 2006   EGD  . Atrial fibrillation (HCC)    flecainide; coumadin  . Breast cancer ([place])   . Carotid artery disease ([place])    16/60: RICA 6-30%, LICA 16-01%  //  Carotiud [place] 09/32: RICA 3-55%; LICA 73-22% >> FU 1 year   . Chronic diastolic heart failure (HCC)    a. echo 10/10: mild LVH, EF 55-60%, mild AI, mild MR, mild to mod LAE, mild RVE, severe RAE, mod TR, PASP 53  /  b.  Echo 3/14:  Mild LVH, EF 55%, Tr AI, MAC, mild MR, mod LAE, PASP 31, trivial eff. // c. echo 1/17: EF 55-60%, mild AI, MAC, mild MR, moderate BAE, moderate TR  . Diabetes mellitus   . Diabetic peripheral neuropathy ([place]) 03/09/2015  . Diverticulosis   . Esophagitis, unspecified 2006   EGD  . Hiatal hernia 2006   EGD  . History of nuclear stress test    a. Myoview 2/12: EF 69%, no scar or ischemia  . Hx of adenomatous colonic polyps   . Hypertension   . Mediastinal lymphadenopathy   . Mitral regurgitation    mild, echo, October, 2010  . Osteopenia   . Pulmonary embolus [facility]) 2006   2006, with DVT  . Pulmonary HTN    53 mmHg echo, 2010,  moderate TR, mild right ventricular enlargement  . S/P laminectomy    June 22, 2011  . Tachy-brady syndrome ([place])    s/p pacer 07/2009  . Thyroid nodule    non-neoplastic goiter  . Venous insufficiency    Chronic    Patient Active Problem List   Diagnosis Date Noted  . Atrial flutter ([place]) 10/14/2016  . Dysuria 10/10/2016  . Chronic back pain 07/28/2016  . Diabetes mellitus type 2, noninsulin dependent ([place])   . CRD (chronic renal disease), stage III 04/12/2016  . Acute on chronic renal insufficiency 04/12/2016  . Chronic anticoagulation-Coumadin 04/12/2016  . Dyspnea 04/12/2016  . Hypertensive heart disease 03/28/2016  . Anemia, iron deficiency 10/22/2015  . Diabetic peripheral neuropathy ([place]) 03/09/2015  . Chronic diastolic CHF (congestive heart failure) ([place]) 09/07/2014  . Hypertension 06/21/2013  . Osteopenia   . Tachy-brady syndrome ([place])   . Atrial fibrillation ([place])   . Pulmonary HTN   . History of pulmonary embolism   . Carotid artery disease ([place])   . History of breast cancer   . Ejection fraction-55-60% by echo Jan 2017   . Pacemaker-Medtronic   . Syncope   . Mitral regurgitation   . Mediastinal lymphadenopathy   . THYROID NODULE, HX OF  07/19/2009  . Diabetes mellitus ([place]) 06/13/2008    Past Surgical History:  Procedure Laterality Date  . ABDOMINAL HYSTERECTOMY    . ANKLE SURGERY    . BACK SURGERY     1973, 2012   . BREAST SURGERY     Left  . PACEMAKER INSERTION  06/30/09   MDT Adalpta L implanted by Dr Olevia Perches    OB History    No data available       Home Medications    Prior to Admission medications   Medication Sig Start Date End Date Taking? Authorizing Provider  bethanechol (URECHOLINE) 25 MG tablet Take 25 mg by mouth daily.   Yes Historical Provider, MD  furosemide (LASIX) 80 MG tablet Take 0.5 tablets (40 mg total) by mouth 2 (two) times daily. 04/16/16  Yes Florencia Reasons, MD  HYDROcodone-acetaminophen (NORCO/VICODIN) 5-325 MG tablet  Take 0.5-1 tablets by mouth 2 (two) times daily as needed for moderate pain. 10/10/16  Yes Hoyt Koch, MD  metFORMIN (GLUCOPHAGE) 500 MG tablet Take 500 mg by mouth 2 (two) times daily with a meal.    Yes Historical Provider, MD  metoprolol succinate (TOPROL-XL) 25 MG 24 hr tablet TAKE 1/2 (12.5 MG) TABLET BY MOUTH DAILY 12/08/15  Yes Dorothy Spark, MD  pyridoxine (B-6) 200 MG tablet Take 200 mg by mouth daily.   Yes Historical Provider, MD  vitamin B-12 (CYANOCOBALAMIN) 500 MCG tablet Take 500 mcg by mouth daily.    Yes Historical Provider, MD  warfarin (COUMADIN) 5 MG tablet Take 0.5-1 tablets (2.5-5 mg total) by mouth See admin instructions. 2.5 mg daily on Sun/Mon/Tues/Wed/Fri/Sat and 5 mg on Thurs 04/16/16  Yes Florencia Reasons, MD  ALPRAZolam Duanne Moron) 0.25 MG tablet Take 1 tablet (0.25 mg total) by mouth daily as needed for anxiety. Patient not taking: Reported on 10/14/2016 06/19/16   Hoyt Koch, MD  gabapentin (NEURONTIN) 300 MG capsule Take 300 mg by mouth 3 (three) times daily.  06/26/15 07/26/16  Historical Provider, MD  nitrofurantoin, macrocrystal-monohydrate, (MACROBID) 100 MG capsule Take 1 capsule (100 mg total) by mouth 2 (two) times daily. Patient not taking: Reported on 10/14/2016 10/10/16   Hoyt Koch, MD    Family History Family History  Problem Relation Age of Onset  . Heart disease Mother   . Hypertension Mother   . COPD Father   . Hypertension Father   . Hypertension Sister   . Hypertension Brother   . Hypertension Daughter   . Colon cancer Neg Hx   . Heart attack Neg Hx   . Stroke Neg Hx     Social History Social History  Substance Use Topics  . Smoking status: Former Smoker    Packs/day: 4.00    Quit date: 10/26/1998  . Smokeless tobacco: Never Used  . Alcohol use No     Allergies   Flecainide; Morphine and related; and Cephalexin   Review of Systems Review of Systems  All other systems reviewed and are negative.    Physical  Exam Updated Vital Signs BP 149/79 (BP Location: Right Arm)   Pulse 118   Temp 99.6 F (37.6 C) (Oral)   Resp 18   SpO2 94%   Physical Exam  Constitutional: She is oriented to person, place, and time. She appears well-developed and well-nourished. No distress.  HENT:  Head: Normocephalic and atraumatic.  Eyes: EOM are normal.  Neck: Normal range of motion.  Cardiovascular: Regular rhythm and normal heart sounds.   Tachycardia  Pulmonary/Chest: Effort normal and breath sounds normal.  Abdominal: Soft. She exhibits no distension. There is no tenderness.  Musculoskeletal: Normal range of motion. She exhibits no edema.  Neurological: She is alert and oriented to person, place, and time.  Skin: Skin is warm and dry.  Psychiatric: She has a normal mood and affect. Judgment normal.  Nursing note and vitals reviewed.    ED Treatments / Results  Labs (all labs ordered are listed, but only abnormal results are displayed) Labs Reviewed  BRAIN NATRIURETIC PEPTIDE - Abnormal; Notable for the following:       Result Value   B Natriuretic Peptide 160.1 (*)    All other components within normal limits  CBC WITH DIFFERENTIAL/PLATELET - Abnormal; Notable for the following:    RBC 3.81 (*)    Hemoglobin 11.3 (*)    HCT 34.6 (*)    Lymphs Abs 0.5 (*)    All other components within normal limits  COMPREHENSIVE METABOLIC PANEL - Abnormal; Notable for the following:    Glucose, Bld 206 (*)    Creatinine, Ser 1.30 (*)    Albumin 3.1 (*)    ALT 13 (*)    GFR calc non Af Amer 37 (*)    GFR calc Af Amer 43 (*)    All other components within normal limits  PROTIME-INR - Abnormal; Notable for the following:    Prothrombin Time 22.1 (*)    All other components within normal limits  CBC WITH DIFFERENTIAL/PLATELET  COMPREHENSIVE METABOLIC PANEL  URINALYSIS, ROUTINE W REFLEX MICROSCOPIC  I-STAT TROPOININ, ED  I-STAT TROPOININ, ED  I-STAT TROPOININ, ED  TYPE AND SCREEN  ABO/RH    EKG   EKG Interpretation  Date/Time:  Monday October 14 2016 10:04:56 EST Ventricular Rate:  120 PR Interval:    QRS Duration: 79 QT Interval:  337 QTC Calculation: 477 R Axis:   24 Text Interpretation:  Atrial flutter with 2:1 AV block Borderline ST depression, diffuse leads changed from prior ecc. no longer paced Confirmed by Sherman Donaldson  MD, Kaci Dillie ([id number]) on 10/14/2016 10:29:52 AM       Radiology Dg Chest 2 View  Result Date: 10/14/2016 CLINICAL DATA:  Shortness of breath EXAM: CHEST  2 VIEW COMPARISON:  CT chest 04/12/2016 FINDINGS: There is a dual lead cardiac pacemaker. There is no focal parenchymal opacity. There is no pleural effusion or pneumothorax. The heart and mediastinal contours are unremarkable. There is a large hiatal hernia. The osseous structures are unremarkable. IMPRESSION: No active cardiopulmonary disease. Electronically Signed   By: Kathreen Devoid   On: 10/14/2016 10:27    Procedures Procedures (including critical care time)  Medications Ordered in ED Medications  diltiazem (CARDIZEM) injection 15 mg (15 mg Intravenous Given 10/14/16 1418)  acetaminophen (TYLENOL) tablet 650 mg (650 mg Oral Given 10/14/16 1632)     Initial Impression / Assessment and Plan / ED Course  I have reviewed the triage vital signs and the nursing notes.  Pertinent labs & imaging results that were available during my care of the patient were reviewed by me and considered in my medical decision making (see chart for details).  Clinical Course     Likely symptomatic from her 21 atrial flutter.  Her pacemaker was interrogated and she does seem to have both high atrial high ventricular rates.  IV Cardizem now.  Cardiology consult it and would like hospitalist admission.  Cardiology will plan on cardioversion likely tomorrow if she does not convert.  Hospitalist admission.  Final Clinical Impressions(s) / ED Diagnoses   Final diagnoses:  Atrial flutter, unspecified type ([place])  Trauma  Headache      New Prescriptions New Prescriptions   No medications on file     Jola Schmidt, MD 10/14/16 [phone]
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
## 2016-10-14 NOTE — ED Notes (Signed)
Pt up to the bedside commode with 1 assist to attempt giving a urine sample.
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
## 2016-10-14 NOTE — ED Notes (Signed)
Floor nurse (Tiffany)called this RN to receive report. During report Heparin was sent from the pharmacy. This RN told the nurse it was just sent and will be sent up with the pt to be administered upstairs. Tiffany stated that was fine. This nurse also asked Tiffany if she wanted me to wait until 1920 to send the pt up and Tiffany stated no, that it was fine for her to come up now (1845). Pt will be sent upstairs at this time.

## 2016-10-14 NOTE — ED Triage Notes (Signed)
Pt arrives from home via [facility]. Pt states she got up to use the restroom this morning when she felt weak and thought she was going to pass out. Pt states she sat down and called EMS. Pt also had C/O SOB and nausea. Pt was given 386mL of NS and 4mg  of zofran PTA and nausea is now relieved.

## 2016-10-14 NOTE — ED Notes (Signed)
Pt returned from CT and connected to the monitor 

## 2016-10-14 NOTE — ED Notes (Signed)
Pt did not tolerate in and out cath well. Unable to obtain urine. Pt states she will attempt to void soon. MD aware.

## 2016-10-14 NOTE — Progress Notes (Signed)
Received patient from Emergency room at 7pm. Heparin bag was brought up with her, not hung. Was informed that patient was retaining urine, had an unsuccessful in and out cath from previous nurse and current nurse had not attempted and had tried to go to the bathroom multiple times. I bladder scanned her with it showing 571ml. Patient refused in and out cath and had another unsuccessful attempt in the bathroom. We informed the patient she could be given another two hours top attempt and then after that would need to be cathed and she was agreeable with myself and Kelton Pillar, Therapist, sports. I placed patient on monitor, CCMD notified. Weighed patient, started heparin with bolus.  Cyndia Bent RN

## 2016-10-14 NOTE — ED Notes (Signed)
Will repeat blood draw rt specimen results.

## 2016-10-14 NOTE — ED Notes (Signed)
Patient transported to CT 

## 2016-10-15 ENCOUNTER — Inpatient Hospital Stay (HOSPITAL_COMMUNITY): Payer: Medicare HMO

## 2016-10-15 DIAGNOSIS — I483 Typical atrial flutter: Secondary | ICD-10-CM | POA: Diagnosis not present

## 2016-10-15 DIAGNOSIS — N390 Urinary tract infection, site not specified: Secondary | ICD-10-CM

## 2016-10-15 DIAGNOSIS — R4189 Other symptoms and signs involving cognitive functions and awareness: Secondary | ICD-10-CM | POA: Diagnosis not present

## 2016-10-15 DIAGNOSIS — R69 Illness, unspecified: Secondary | ICD-10-CM | POA: Diagnosis not present

## 2016-10-15 DIAGNOSIS — E119 Type 2 diabetes mellitus without complications: Secondary | ICD-10-CM | POA: Diagnosis not present

## 2016-10-15 DIAGNOSIS — I495 Sick sinus syndrome: Secondary | ICD-10-CM | POA: Diagnosis not present

## 2016-10-15 DIAGNOSIS — I480 Paroxysmal atrial fibrillation: Secondary | ICD-10-CM | POA: Diagnosis not present

## 2016-10-15 DIAGNOSIS — I4891 Unspecified atrial fibrillation: Secondary | ICD-10-CM | POA: Diagnosis not present

## 2016-10-15 DIAGNOSIS — Z950 Presence of cardiac pacemaker: Secondary | ICD-10-CM | POA: Diagnosis not present

## 2016-10-15 DIAGNOSIS — R0609 Other forms of dyspnea: Secondary | ICD-10-CM | POA: Diagnosis not present

## 2016-10-15 DIAGNOSIS — I5032 Chronic diastolic (congestive) heart failure: Secondary | ICD-10-CM | POA: Diagnosis not present

## 2016-10-15 DIAGNOSIS — Z7901 Long term (current) use of anticoagulants: Secondary | ICD-10-CM | POA: Diagnosis not present

## 2016-10-15 DIAGNOSIS — F05 Delirium due to known physiological condition: Secondary | ICD-10-CM | POA: Diagnosis not present

## 2016-10-15 LAB — CBC
HEMATOCRIT: 32.5 % — AB (ref 36.0–46.0)
HEMOGLOBIN: 10.4 g/dL — AB (ref 12.0–15.0)
MCH: 29.1 pg (ref 26.0–34.0)
MCHC: 32 g/dL (ref 30.0–36.0)
MCV: 90.8 fL (ref 78.0–100.0)
Platelets: 286 10*3/uL (ref 150–400)
RBC: 3.58 MIL/uL — ABNORMAL LOW (ref 3.87–5.11)
RDW: 14.4 % (ref 11.5–15.5)
WBC: 4.5 10*3/uL (ref 4.0–10.5)

## 2016-10-15 LAB — COMPREHENSIVE METABOLIC PANEL
ALK PHOS: 110 U/L (ref 38–126)
ALT: 10 U/L — AB (ref 14–54)
AST: 19 U/L (ref 15–41)
Albumin: 3.2 g/dL — ABNORMAL LOW (ref 3.5–5.0)
Anion gap: 13 (ref 5–15)
BUN: 14 mg/dL (ref 6–20)
CALCIUM: 9.1 mg/dL (ref 8.9–10.3)
CO2: 22 mmol/L (ref 22–32)
CREATININE: 1.36 mg/dL — AB (ref 0.44–1.00)
Chloride: 103 mmol/L (ref 101–111)
GFR calc non Af Amer: 35 mL/min — ABNORMAL LOW (ref >60)
GFR, EST AFRICAN AMERICAN: 40 mL/min — AB (ref >60)
GLUCOSE: 144 mg/dL — AB (ref 65–99)
Potassium: 4 mmol/L (ref 3.5–5.1)
SODIUM: 138 mmol/L (ref 135–145)
Total Bilirubin: 1.1 mg/dL (ref 0.3–1.2)
Total Protein: 6.6 g/dL (ref 6.5–8.1)

## 2016-10-15 LAB — MRSA PCR SCREENING: MRSA by PCR: NEGATIVE

## 2016-10-15 LAB — URINALYSIS, ROUTINE W REFLEX MICROSCOPIC
BILIRUBIN URINE: NEGATIVE
GLUCOSE, UA: 150 mg/dL — AB
HGB URINE DIPSTICK: NEGATIVE
Ketones, ur: NEGATIVE mg/dL
NITRITE: NEGATIVE
Protein, ur: NEGATIVE mg/dL
SPECIFIC GRAVITY, URINE: 1.015 (ref 1.005–1.030)
pH: 6 (ref 5.0–8.0)

## 2016-10-15 LAB — PROTIME-INR
INR: 1.63
Prothrombin Time: 19.5 seconds — ABNORMAL HIGH (ref 11.4–15.2)

## 2016-10-15 LAB — HEMOGLOBIN A1C
HEMOGLOBIN A1C: 6.3 % — AB (ref 4.8–5.6)
Mean Plasma Glucose: 134 mg/dL

## 2016-10-15 LAB — GLUCOSE, CAPILLARY: Glucose-Capillary: 152 mg/dL — ABNORMAL HIGH (ref 65–99)

## 2016-10-15 LAB — HEPARIN LEVEL (UNFRACTIONATED): Heparin Unfractionated: 0.53 IU/mL (ref 0.30–0.70)

## 2016-10-15 MED ORDER — AMIODARONE HCL 200 MG PO TABS
200.0000 mg | ORAL_TABLET | Freq: Every day | ORAL | Status: DC
  Administered 2016-10-15: 200 mg via ORAL
  Filled 2016-10-15: qty 1

## 2016-10-15 MED ORDER — LEVOFLOXACIN 250 MG PO TABS: 250.0000 mg | ORAL_TABLET | Freq: Every evening | ORAL | 0 refills | Status: AC

## 2016-10-15 MED ORDER — WARFARIN SODIUM 5 MG PO TABS: 5.0000 mg | ORAL_TABLET | Freq: Once | ORAL | Status: DC

## 2016-10-15 MED ORDER — WARFARIN - PHARMACIST DOSING INPATIENT: Freq: Every day | Status: DC

## 2016-10-15 MED ORDER — AMIODARONE HCL 200 MG PO TABS: 200.0000 mg | ORAL_TABLET | Freq: Every day | ORAL | 0 refills | Status: DC

## 2016-10-15 MED ORDER — DILTIAZEM HCL 25 MG/5ML IV SOLN
10.0000 mg | Freq: Once | INTRAVENOUS | Status: AC
  Administered 2016-10-15: 10 mg via INTRAVENOUS
  Filled 2016-10-15: qty 5

## 2016-10-15 NOTE — Progress Notes (Signed)
ANTICOAGULATION CONSULT NOTE - Follow Up Consult  Pharmacy Consult for Heparin Indication: atrial fibrillation  Allergies  Allergen Reactions  . Flecainide Shortness Of Breath    toxicity  . Morphine And Related Other (See Comments)    Family requests no morphine due to past reaction. Pt stated it made her sick.  . Cephalexin Nausea And Vomiting    Patient Measurements:    Height: 6' Weight: 105.2 kg Heparin Dosing Weight: 95 kg  Vital Signs: Temp: 97.2 F (36.2 C) (01/16 0307) Temp Source: Oral (01/16 0307) BP: 134/93 (01/16 0307) Pulse Rate: 127 (01/16 0307)  Labs:  Recent Labs  10/14/16 1000 10/14/16 1130 10/14/16 1618 10/15/16 0228  HGB QUESTIONABLE RESULTS, RECOMMEND RECOLLECT TO VERIFY 11.3*  --  10.4*  HCT QUESTIONABLE RESULTS, RECOMMEND RECOLLECT TO VERIFY 34.6*  --  32.5*  PLT QUESTIONABLE RESULTS, RECOMMEND RECOLLECT TO VERIFY 311  --  286  LABPROT  --   --  22.1*  --   INR  --   --  1.90  --   HEPARINUNFRC  --   --   --  0.53  CREATININE QUESTIONABLE RESULTS, RECOMMEND RECOLLECT TO VERIFY 1.30*  --  1.36*    Estimated Creatinine Clearance: 42.5 mL/min (by C-G formula based on SCr of 1.36 mg/dL (H)).   Medications:  Scheduled:  . insulin aspart  0-15 Units Subcutaneous TID WC  . levofloxacin  250 mg Oral QPM  . metoprolol succinate  12.5 mg Oral Daily  . pyridoxine  200 mg Oral Daily  . sodium chloride flush  3 mL Intravenous Q12H  . vitamin B-12  500 mcg Oral Daily   Infusions:  . heparin 1,600 Units/hr (10/14/16 2000)    Assessment: 83 yo F on Coumadin PTA for hx afib.  Started on hepatin while Coumadin on hold.  Goal of Therapy:  Heparin level 0.3-0.7 units/ml Monitor platelets by anticoagulation protocol: Yes   Plan:  Continue heparin at 1600 units/hr Recheck heparin level in 6 hours for confirmation. Follow-up plans to restart oral anticoagulation.  [employer], Pharm.D., BCPS Clinical Pharmacist Pager [phone] 10/15/2016  3:13 AM

## 2016-10-15 NOTE — Progress Notes (Signed)
Patient discharged per MD order. IV removed, telemetry d/c, CCMD notified. Patient refused ECHO. MD was made aware. Called daughter and discussed discharge. Went over AVS with patient and with nephew. Explained to him what medications she needed to take tonight. Patient also refused a urine sample for urine culture that was ordered. She stated she did not have to go and wasn't going to try. Patient to home with nephew via nephew. Took patient out by wheelchair.  Cyndia Bent RN

## 2016-10-15 NOTE — Progress Notes (Signed)
Orthopedic Tech Progress Note Patient Details:  [name] [name] [birth year] [id number]  Ortho Devices Type of Ortho Device: Louretta Parma boot Ortho Device/Splint Location: lle Ortho Device/Splint Interventions: Application   Makaylyn Sinyard 10/15/2016, 11:12 AM

## 2016-10-15 NOTE — CV Procedure (Signed)
2D echo attempted but patient refused until she can discuss the need for the exam with her MD.

## 2016-10-15 NOTE — Care Management CC44 (Signed)
Condition Code 44 Documentation Completed  Patient Details  Name: [name] [name] MRN: [id number] Date of Birth: [birth year]   Condition Code 44 given:   yes Patient signature on Condition Code 44 notice:   yes Documentation of 2 MD's agreement:   yes Code 44 added to claim:   yes    [name] Romeo Rabon, RN 10/15/2016, 3:24 PM
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
## 2016-10-15 NOTE — Care Management Obs Status (Signed)
[facility] NOTIFICATION   Patient Details  Name: Dosia Yodice MRN: [id number] Date of Birth: Feb 03, 1931   Medicare Observation Status Notification Given:  Yes    Dawayne Patricia, RN 10/15/2016, 3:24 PM

## 2016-10-15 NOTE — Progress Notes (Signed)
ANTICOAGULATION CONSULT NOTE - Follow Up Consult  Pharmacy Consult for Heparin, coumadin  Indication: atrial fibrillation  Allergies  Allergen Reactions  . Flecainide Shortness Of Breath    toxicity  . Morphine And Related Other (See Comments)    Family requests no morphine due to past reaction. Pt stated it made her sick.  . Cephalexin Nausea And Vomiting    Patient Measurements:    Height: 6' Weight: 105.2 kg Heparin Dosing Weight: 95 kg  Vital Signs: Temp: 97.2 F (36.2 C) (01/16 0307) Temp Source: Oral (01/16 0307) BP: 117/66 (01/16 0427) Pulse Rate: 134 (01/16 0431)  Labs:  Recent Labs  10/14/16 1000 10/14/16 1130 10/14/16 1618 10/15/16 0228  HGB QUESTIONABLE RESULTS, RECOMMEND RECOLLECT TO VERIFY 11.3*  --  10.4*  HCT QUESTIONABLE RESULTS, RECOMMEND RECOLLECT TO VERIFY 34.6*  --  32.5*  PLT QUESTIONABLE RESULTS, RECOMMEND RECOLLECT TO VERIFY 311  --  286  LABPROT  --   --  22.1*  --   INR  --   --  1.90  --   HEPARINUNFRC  --   --   --  0.53  CREATININE QUESTIONABLE RESULTS, RECOMMEND RECOLLECT TO VERIFY 1.30*  --  1.36*    Estimated Creatinine Clearance: 42.5 mL/min (by C-G formula based on SCr of 1.36 mg/dL (H)).   Medications:  Scheduled:  . insulin aspart  0-15 Units Subcutaneous TID WC  . levofloxacin  250 mg Oral QPM  . metoprolol succinate  12.5 mg Oral Daily  . pyridoxine  200 mg Oral Daily  . sodium chloride flush  3 mL Intravenous Q12H  . vitamin B-12  500 mcg Oral Daily  . warfarin  5 mg Oral ONCE-1800  . Warfarin - Pharmacist Dosing Inpatient   Does not apply q1800   Infusions:  . heparin Stopped (10/15/16 0800)    Assessment: 82 yo F on Coumadin PTA for hx afib.  Pharmacy dosing heparin and coumadin -Heparin was off this am as IV line was pulled (last at 1600 units/hr and heparin level was 0.53) -INR= 1.9  Coumadin dose PTA: 2.5mg /day except 5mg  Th  Goal of Therapy:  Heparin level 0.3-0.7 units/ml Monitor platelets by  anticoagulation protocol: Yes   Plan:  -Restart heparin at 1600 units/hr -Heparin level in 8 hours and daily wth CBC daily -Coumadn 5mg  po today -daily PT/INR  Hildred Laser, Pharm D 10/15/2016 9:51 AM

## 2016-10-15 NOTE — Consult Note (Signed)
[place] Nurse wound consult note Reason for Consult: Left foot ulcer/left lower leg Wound type: no open wounds, old callous that is healed She is followed by Dr. Elvina Mattes for edema control on the LLE.  She reports she sees Dr. Elvina Mattes one time a week which was scheduled for today.  Dressing procedure/placement/frequency: Q week Unna's boot change to the LLE for edema control.  Will ask orthopedic tech to change today.  [place] ordered to the bedside.  Will need weekly changes on Tuesday. Follow up with Dr. Elvina Mattes for weekly changes once [facility] from the hospital.  Discussed POC with patient and bedside nurse.  Re consult if needed, will not follow at this time. Thanks  Skarleth Delmonico [employer], RN,CWOCN, CNS [phone])
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
## 2016-10-17 LAB — URINE CULTURE

## 2016-10-19 LAB — CULTURE, BLOOD (ROUTINE X 2)
Culture: NO GROWTH
Culture: NO GROWTH

## 2016-10-22 ENCOUNTER — Ambulatory Visit (INDEPENDENT_AMBULATORY_CARE_PROVIDER_SITE_OTHER): Payer: Medicare HMO | Admitting: Internal Medicine

## 2016-10-22 ENCOUNTER — Other Ambulatory Visit (INDEPENDENT_AMBULATORY_CARE_PROVIDER_SITE_OTHER): Payer: Medicare HMO

## 2016-10-22 ENCOUNTER — Encounter: Payer: Self-pay | Admitting: Internal Medicine

## 2016-10-22 VITALS — BP 140/80 | HR 130 | Temp 98.1°F | Resp 16 | Ht 72.0 in | Wt 226.8 lb

## 2016-10-22 DIAGNOSIS — R300 Dysuria: Secondary | ICD-10-CM

## 2016-10-22 DIAGNOSIS — I4892 Unspecified atrial flutter: Secondary | ICD-10-CM

## 2016-10-22 DIAGNOSIS — R4189 Other symptoms and signs involving cognitive functions and awareness: Secondary | ICD-10-CM | POA: Diagnosis not present

## 2016-10-22 DIAGNOSIS — N390 Urinary tract infection, site not specified: Secondary | ICD-10-CM | POA: Diagnosis not present

## 2016-10-22 LAB — COMPREHENSIVE METABOLIC PANEL
ALBUMIN: 4.2 g/dL (ref 3.5–5.2)
ALK PHOS: 116 U/L (ref 39–117)
ALT: 16 U/L (ref 0–35)
AST: 18 U/L (ref 0–37)
BUN: 14 mg/dL (ref 6–23)
CALCIUM: 9.3 mg/dL (ref 8.4–10.5)
CO2: 25 mEq/L (ref 19–32)
Chloride: 104 mEq/L (ref 96–112)
Creatinine, Ser: 1.35 mg/dL — ABNORMAL HIGH (ref 0.40–1.20)
GFR: 39.71 mL/min — AB (ref >60.00)
GLUCOSE: 135 mg/dL — AB (ref 70–99)
POTASSIUM: 3.7 meq/L (ref 3.5–5.1)
Sodium: 137 mEq/L (ref 135–145)
Total Bilirubin: 1.2 mg/dL (ref 0.2–1.2)
Total Protein: 7.9 g/dL (ref 6.0–8.3)

## 2016-10-22 LAB — URINALYSIS, ROUTINE W REFLEX MICROSCOPIC
BILIRUBIN URINE: NEGATIVE
HGB URINE DIPSTICK: NEGATIVE
KETONES UR: NEGATIVE
LEUKOCYTES UA: NEGATIVE
NITRITE: NEGATIVE
RBC / HPF: NONE SEEN (ref 0–?)
Specific Gravity, Urine: 1.015 (ref 1.000–1.030)
TOTAL PROTEIN, URINE-UPE24: 30 — AB
UROBILINOGEN UA: 0.2 (ref 0.0–1.0)
Urine Glucose: NEGATIVE
pH: 6 (ref 5.0–8.0)

## 2016-10-22 LAB — CBC
HEMATOCRIT: 34.1 % — AB (ref 36.0–46.0)
HEMOGLOBIN: 11.2 g/dL — AB (ref 12.0–15.0)
MCHC: 32.9 g/dL (ref 30.0–36.0)
MCV: 87 fl (ref 78.0–100.0)
PLATELETS: 382 10*3/uL (ref 150.0–400.0)
RBC: 3.92 Mil/uL (ref 3.87–5.11)
RDW: 15.2 % (ref 11.5–15.5)
WBC: 4.6 10*3/uL (ref 4.0–10.5)

## 2016-10-22 MED ORDER — ALPRAZOLAM 0.25 MG PO TABS: 0.2500 mg | ORAL_TABLET | Freq: Two times a day (BID) | ORAL | 0 refills | Status: DC | PRN

## 2016-10-22 MED ORDER — PROMETHAZINE HCL 25 MG PO TABS: 25.0000 mg | ORAL_TABLET | Freq: Three times a day (TID) | ORAL | 0 refills | Status: AC | PRN

## 2016-10-22 NOTE — Progress Notes (Signed)
Pre visit review using our clinic review tool, if applicable. No additional management support is needed unless otherwise documented below in the visit note. 

## 2016-10-22 NOTE — Progress Notes (Signed)
   Subjective:    Patient ID: [name] [name], female    DOB: [birth year], 83 y.o.   MRN: [id number]  HPI The patient is an 83 YO female coming in for hospital follow up. She was in with A flutter/RVR and converted spontaneously. She was placed on heparin drip and then coumadin. She did pull her IV during some confusion in the hospital. There were concerns about UTI. She did not fill her antibiotic after leaving the hospital. She also did not fill the amiodarone she was advised to start taking for her heart. She is still taking metoprolol. She is still having some nausea and not feeling that well. She is not having typical urinary symptoms at this time. She is due to see her cardiologist on Friday and first stated that she was told not to take the amiodarone but then with clarification from her family member that she decided not to take it. She denies chest pains but is having some palpitations. No SOB. No abdominal pain but nausea. No diarrhea or constipation.   PMH, [facility], social history reviewed and updated.   Review of Systems  Constitutional: Positive for activity change, appetite change and fatigue. Negative for chills, fever and unexpected weight change.  HENT: Negative.   Eyes: Negative.   Respiratory: Negative.   Cardiovascular: Positive for palpitations. Negative for chest pain and leg swelling.  Gastrointestinal: Positive for nausea. Negative for abdominal distention, abdominal pain, constipation, diarrhea and vomiting.  Musculoskeletal: Positive for arthralgias and back pain. Negative for joint swelling, myalgias, neck pain and neck stiffness.  Skin: Negative.   Neurological: Negative for dizziness, syncope, weakness, light-headedness and headaches.  Psychiatric/Behavioral: Negative.       Objective:   Physical Exam  Constitutional: She is oriented to person, place, and time. She appears well-developed and well-nourished.  HENT:  Head: Normocephalic and atraumatic.  Eyes: EOM are  normal.  Neck: Normal range of motion.  Cardiovascular: Normal rate.   Rate <110 for minute of auscultation. Irreg irreg.  Pulmonary/Chest: Effort normal and breath sounds normal. No respiratory distress. She has no wheezes. She has no rales.  Abdominal: Soft. Bowel sounds are normal. She exhibits no distension. There is no tenderness. There is no rebound.  Musculoskeletal: She exhibits no edema.  Neurological: She is alert and oriented to person, place, and time.  Skin: Skin is warm and dry.   Vitals:   10/22/16 1554  BP: 140/80  Pulse: (!) 130  Resp: 16  Temp: 98.1 F (36.7 C)  TempSrc: Oral  SpO2: 98%  Weight: 226 lb 12.8 oz (102.9 kg)  Height: 6' (1.829 m)      Assessment & Plan:

## 2016-10-22 NOTE — Patient Instructions (Signed)
We are checking the blood work today and the urine.   We would like you to start taking the amiodarone and will verify with Dr. Rayann Heman if he wants you to take it.   We have sent in the refill of the nausea medicine and the alprazolam that you can use rarely.

## 2016-10-23 ENCOUNTER — Encounter: Payer: Self-pay | Admitting: Internal Medicine

## 2016-10-23 DIAGNOSIS — Z87891 Personal history of nicotine dependence: Secondary | ICD-10-CM | POA: Diagnosis not present

## 2016-10-23 DIAGNOSIS — Z0000 Encounter for general adult medical examination without abnormal findings: Secondary | ICD-10-CM | POA: Diagnosis not present

## 2016-10-23 DIAGNOSIS — M545 Low back pain: Secondary | ICD-10-CM | POA: Diagnosis not present

## 2016-10-23 DIAGNOSIS — I110 Hypertensive heart disease with heart failure: Secondary | ICD-10-CM | POA: Diagnosis not present

## 2016-10-23 DIAGNOSIS — E1142 Type 2 diabetes mellitus with diabetic polyneuropathy: Secondary | ICD-10-CM | POA: Diagnosis not present

## 2016-10-23 DIAGNOSIS — E669 Obesity, unspecified: Secondary | ICD-10-CM | POA: Diagnosis not present

## 2016-10-23 DIAGNOSIS — I5032 Chronic diastolic (congestive) heart failure: Secondary | ICD-10-CM | POA: Diagnosis not present

## 2016-10-23 DIAGNOSIS — Z6833 Body mass index (BMI) 33.0-33.9, adult: Secondary | ICD-10-CM | POA: Diagnosis not present

## 2016-10-23 DIAGNOSIS — R110 Nausea: Secondary | ICD-10-CM | POA: Diagnosis not present

## 2016-10-23 DIAGNOSIS — Z853 Personal history of malignant neoplasm of breast: Secondary | ICD-10-CM | POA: Diagnosis not present

## 2016-10-23 DIAGNOSIS — I4891 Unspecified atrial fibrillation: Secondary | ICD-10-CM | POA: Diagnosis not present

## 2016-10-23 DIAGNOSIS — Z79891 Long term (current) use of opiate analgesic: Secondary | ICD-10-CM | POA: Diagnosis not present

## 2016-10-23 DIAGNOSIS — Z9012 Acquired absence of left breast and nipple: Secondary | ICD-10-CM | POA: Diagnosis not present

## 2016-10-23 DIAGNOSIS — R69 Illness, unspecified: Secondary | ICD-10-CM | POA: Diagnosis not present

## 2016-10-23 LAB — URINE CULTURE: ORGANISM ID, BACTERIA: NO GROWTH

## 2016-10-23 NOTE — Assessment & Plan Note (Signed)
She was strongly advised to start taking amiodarone. Will forward to Dr. Rayann Heman her cardiologist to see if their office can call and reiterate how important it is to take her heart medicine. She is in A fib today and is taking her beta blocker. Overall rate on auscultation is at goal of <110. She is not having symptoms from the A fib at this time unless the nausea is a symptom.
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
## 2016-10-23 NOTE — Assessment & Plan Note (Signed)
She did not fill her antibiotic from the hospital and the culture from there had multiple organism. In order to limit her exposure to antibiotics we will check U/a and culture today and only treat if signs of infection. She did continue taking the medication we had prescribed for her before she went into the hospital although her compliance is in question as she stated she still had several pills left and should have been finished by now.

## 2016-10-24 ENCOUNTER — Other Ambulatory Visit: Payer: Self-pay | Admitting: Internal Medicine

## 2016-10-24 MED ORDER — ALPRAZOLAM 0.25 MG PO TABS: 0.2500 mg | ORAL_TABLET | Freq: Two times a day (BID) | ORAL | 0 refills | Status: DC | PRN

## 2016-10-25 ENCOUNTER — Ambulatory Visit (INDEPENDENT_AMBULATORY_CARE_PROVIDER_SITE_OTHER): Payer: Medicare HMO | Admitting: Internal Medicine

## 2016-10-25 ENCOUNTER — Ambulatory Visit (INDEPENDENT_AMBULATORY_CARE_PROVIDER_SITE_OTHER): Payer: Medicare HMO | Admitting: *Deleted

## 2016-10-25 VITALS — BP 144/76 | HR 70 | Ht 72.0 in | Wt 224.0 lb

## 2016-10-25 DIAGNOSIS — Z7901 Long term (current) use of anticoagulants: Secondary | ICD-10-CM | POA: Diagnosis not present

## 2016-10-25 DIAGNOSIS — I480 Paroxysmal atrial fibrillation: Secondary | ICD-10-CM

## 2016-10-25 DIAGNOSIS — I4891 Unspecified atrial fibrillation: Secondary | ICD-10-CM

## 2016-10-25 DIAGNOSIS — I482 Chronic atrial fibrillation, unspecified: Secondary | ICD-10-CM

## 2016-10-25 DIAGNOSIS — Z86711 Personal history of pulmonary embolism: Secondary | ICD-10-CM | POA: Diagnosis not present

## 2016-10-25 DIAGNOSIS — F0150 Vascular dementia without behavioral disturbance: Secondary | ICD-10-CM

## 2016-10-25 DIAGNOSIS — Z950 Presence of cardiac pacemaker: Secondary | ICD-10-CM | POA: Diagnosis not present

## 2016-10-25 DIAGNOSIS — R69 Illness, unspecified: Secondary | ICD-10-CM | POA: Diagnosis not present

## 2016-10-25 LAB — POCT INR: INR: 2.9

## 2016-10-25 NOTE — Patient Instructions (Signed)
Medication Instructions:  Your physician recommends that you continue on your current medications as directed. Please refer to the Current Medication list given to you today.   Labwork: None ordered   Testing/Procedures: None ordered   Follow-Up: Your physician recommends that you schedule a follow-up appointment in: 6 weeks with Dr Rayann Heman   Any Other Special Instructions Will Be Listed Below (If Applicable).     If you need a refill on your cardiac medications before your next appointment, please call your pharmacy.

## 2016-10-25 NOTE — Progress Notes (Signed)
PCP:  Hoyt Koch, MD Primary Cardiologist:  Previously Dr Ron Parker  The patient presents today for routine electrophysiology followup.  She was recently hospitalized for UTI (see my note).  She was noted to have atrial flutter at that time for which she was started on amiodarone.  She did not take amiodarone until 2 days ago.  She appears to be having progressive cognitive impairment.  Her daughter notes frequent confusion, memory issues, and irritability/ agitation.  Today, she denies symptoms of palpitations, chest pain, shortness of breath, orthopnea, PND, lower extremity edema, dizziness, presyncope, syncope, or neurologic sequela.  The patient feels that she is tolerating medications without difficulties and is otherwise without complaint today.   Past Medical History:  Diagnosis Date  . Acute gastric ulcer without mention of hemorrhage, perforation, or obstruction 2006   EGD  . Anxiety   . Atrial fibrillation (HCC)    flecainide; coumadin  . Breast cancer ([place])    left  . Carotid artery disease ([place])    45/99: RICA 7-74%, LICA 14-23%  //  Carotiud [place] 95/32: RICA 0-23%; LICA 34-35% >> FU 1 year   . Chronic diastolic heart failure (HCC)    a. echo 10/10: mild LVH, EF 55-60%, mild AI, mild MR, mild to mod LAE, mild RVE, severe RAE, mod TR, PASP 53  /  b.  Echo 3/14:  Mild LVH, EF 55%, Tr AI, MAC, mild MR, mod LAE, PASP 31, trivial eff. // c. echo 1/17: EF 55-60%, mild AI, MAC, mild MR, moderate BAE, moderate TR  . Chronic lower back pain   . Diabetic peripheral neuropathy ([place]) 03/09/2015  . Diverticulosis   . Esophagitis, unspecified 2006   EGD  . Hiatal hernia 2006   EGD  . History of nuclear stress test    a. Myoview 2/12: EF 69%, no scar or ischemia  . Hx of adenomatous colonic polyps   . Hypertension   . Mediastinal lymphadenopathy   . Mitral regurgitation    mild, echo, October, 2010  . Osteopenia   . Presence of permanent cardiac pacemaker   . Pulmonary embolus [facility])  2006   2006, with DVT  . Pulmonary HTN    53 mmHg echo, 2010, moderate TR, mild right ventricular enlargement  . Tachy-brady syndrome ([place])    s/p pacer 07/2009  . Thyroid nodule    non-neoplastic goiter  . Type II diabetes mellitus ([place])   . Venous insufficiency    Chronic   Past Surgical History:  Procedure Laterality Date  . ABDOMINAL HYSTERECTOMY    . ANKLE FRACTURE SURGERY Right   . BACK SURGERY    . BREAST LUMPECTOMY Left   . FRACTURE SURGERY    . INSERT / REPLACE / REMOVE PACEMAKER  06/30/2009   MDT Adalpta L implanted by Dr Olevia Perches  . LUMBAR LAMINECTOMY  1973, 06/22/2011   "no hardware either time"    Current Outpatient Prescriptions  Medication Sig Dispense Refill  . ALPRAZolam (XANAX) 0.25 MG tablet Take 1 tablet (0.25 mg total) by mouth 2 (two) times daily as needed for anxiety. 20 tablet 0  . amiodarone (PACERONE) 200 MG tablet Take 1 tablet (200 mg total) by mouth daily. 30 tablet 0  . furosemide (LASIX) 80 MG tablet Take 0.5 tablets (40 mg total) by mouth 2 (two) times daily. 180 tablet 3  . HYDROcodone-acetaminophen (NORCO/VICODIN) 5-325 MG tablet Take 0.5-1 tablets by mouth 2 (two) times daily as needed for moderate pain. 30 tablet 0  .  metFORMIN (GLUCOPHAGE) 500 MG tablet Take 500 mg by mouth 2 (two) times daily with a meal.     . metoprolol succinate (TOPROL-XL) 25 MG 24 hr tablet TAKE 1/2 (12.5 MG) TABLET BY MOUTH DAILY 45 tablet 3  . promethazine (PHENERGAN) 25 MG tablet Take 1 tablet (25 mg total) by mouth every 8 (eight) hours as needed for nausea or vomiting. 20 tablet 0  . pyridoxine (B-6) 200 MG tablet Take 200 mg by mouth daily.    . vitamin B-12 (CYANOCOBALAMIN) 500 MCG tablet Take 500 mcg by mouth daily.     Marland Kitchen warfarin (COUMADIN) 5 MG tablet Take 0.5-1 tablets (2.5-5 mg total) by mouth See admin instructions. 2.5 mg daily on Sun/Mon/Tues/Wed/Fri/Sat and 5 mg on Thurs 30 tablet 3   No current facility-administered medications for this visit.      Allergies  Allergen Reactions  . Flecainide Shortness Of Breath    toxicity  . Morphine And Related Other (See Comments)    Family requests no morphine due to past reaction. Pt stated it made her sick.  . Cephalexin Nausea And Vomiting    Social History   Social History  . Marital status: Widowed    Spouse name: N/A  . Number of children: 3  . Years of education: N/A   Occupational History  . Retired     Social History Main Topics  . Smoking status: Former Smoker    Packs/day: 1.00    Years: 47.00    Types: Cigarettes    Quit date: 10/26/1998  . Smokeless tobacco: Never Used  . Alcohol use Yes     Comment: 10/14/2016 "I'll have a beer q once in awhile"  . Drug use: No  . Sexual activity: No   Other Topics Concern  . Not on file   Social History Narrative   Widowed.  Lives in [place] by herself.  Avoids caffeine.  Very active, taking care of her brother who also lives by himself and is in his late 77's.    Family History  Problem Relation Age of Onset  . Heart disease Mother   . Hypertension Mother   . COPD Father   . Hypertension Father   . Hypertension Sister   . Hypertension Brother   . Hypertension Daughter   . Colon cancer Neg Hx   . Heart attack Neg Hx   . Stroke Neg Hx     ROS-  All systems are reviewed and are negative except as outlined in the HPI above  Physical Exam: Vitals:   10/25/16 1533  BP: (!) 144/76  Pulse: 70  Weight: 224 lb (101.6 kg)  Height: 6' (1.829 m)    GEN- The patient is well appearing, alert but easily confused  Head- normocephalic, atraumatic Eyes-  Sclera clear, conjunctiva pink Ears- hearing intact Oropharynx- clear Neck- supple,  Lungs- Clear to ausculation bilaterally, normal work of breathing Chest- pacemaker pocket is well healed Heart- Regular rate and rhythm, no murmurs, rubs or gallops, PMI not laterally displaced GI- soft, NT, ND, + BS Extremities- no clubbing, cyanosis, or edema   Pacemaker  interrogation- reviewed in detail today,  See PACEART report   Assessment and Plan:  1. Sick sinus syndrome Normal pacemaker function  See Pace Art report No changes today  2. Atrial fibrillation Recently started on amiodarone She has had some nausea but this has been present for over a week and she just started amiodarone 2 days ago. Continue anticoagulation.  She is aware  that INRs will need to be followed more closely on amiodarone.  3. HTN Stable No change required today  4. Dementia I am very concerned about her cognitive state.  She is clearly declining.  I have strongly advised that she consult with neurology as recommended in the hospital.  Her daughter states that she will contact Dr Jannifer Franklin to schedule this.  Return to see me in 6 weeks  Thompson Grayer MD, [facility] 10/25/2016 6:52 PM

## 2016-10-28 ENCOUNTER — Encounter: Payer: Self-pay | Admitting: Internal Medicine

## 2016-10-28 ENCOUNTER — Telehealth: Payer: Self-pay | Admitting: Neurology

## 2016-10-28 NOTE — Telephone Encounter (Signed)
Returned call to pt's dtr. Sooner appt scheduled 11/14/16 w/ &:15 arrival time.

## 2016-10-28 NOTE — Telephone Encounter (Signed)
Patients daughter called needing to schedule a hospital fu with Dr. Jannifer Franklin for memory changes.  Are we able to schedule patient sooner than June (next available).  Please call

## 2016-10-29 LAB — CUP PACEART INCLINIC DEVICE CHECK
Battery Impedance: 883 Ohm
Battery Remaining Longevity: 69 mo
Battery Voltage: 2.78 V
Brady Statistic AP VP Percent: 0 %
Brady Statistic AS VP Percent: 0 %
Implantable Lead Implant Date: 20101001
Implantable Lead Model: 5076
Implantable Lead Model: 5076
Implantable Pulse Generator Implant Date: 20101001
Lead Channel Impedance Value: 375 Ohm
Lead Channel Pacing Threshold Amplitude: 0.25 V
Lead Channel Pacing Threshold Pulse Width: 0.4 ms
Lead Channel Pacing Threshold Pulse Width: 0.52 ms
Lead Channel Sensing Intrinsic Amplitude: 0.7 mV
Lead Channel Setting Pacing Amplitude: 2.5 V
Lead Channel Setting Pacing Pulse Width: 0.52 ms
MDC IDC LEAD IMPLANT DT: 20101001
MDC IDC LEAD LOCATION: 753859
MDC IDC LEAD LOCATION: 753860
MDC IDC MSMT LEADCHNL RV IMPEDANCE VALUE: 369 Ohm
MDC IDC MSMT LEADCHNL RV PACING THRESHOLD AMPLITUDE: 1 V
MDC IDC MSMT LEADCHNL RV SENSING INTR AMPL: 11.2 mV
MDC IDC SESS DTM: 20180126221655
MDC IDC SET LEADCHNL RA PACING AMPLITUDE: 2 V
MDC IDC SET LEADCHNL RV SENSING SENSITIVITY: 2.8 mV
MDC IDC STAT BRADY AP VS PERCENT: 15 %
MDC IDC STAT BRADY AS VS PERCENT: 84 %

## 2016-11-04 ENCOUNTER — Ambulatory Visit (INDEPENDENT_AMBULATORY_CARE_PROVIDER_SITE_OTHER): Payer: Medicare HMO | Admitting: *Deleted

## 2016-11-04 DIAGNOSIS — Z86711 Personal history of pulmonary embolism: Secondary | ICD-10-CM

## 2016-11-04 DIAGNOSIS — I4891 Unspecified atrial fibrillation: Secondary | ICD-10-CM | POA: Diagnosis not present

## 2016-11-04 DIAGNOSIS — I482 Chronic atrial fibrillation, unspecified: Secondary | ICD-10-CM

## 2016-11-04 LAB — POCT INR: INR: 2.7

## 2016-11-06 ENCOUNTER — Other Ambulatory Visit: Payer: Self-pay | Admitting: Internal Medicine

## 2016-11-10 ENCOUNTER — Emergency Department (HOSPITAL_COMMUNITY): Payer: Medicare HMO

## 2016-11-10 ENCOUNTER — Emergency Department (HOSPITAL_COMMUNITY)
Admission: EM | Admit: 2016-11-10 | Discharge: 2016-11-10 | Disposition: A | Discharge status: Home / Self Care | Payer: Medicare HMO | Attending: Emergency Medicine | Admitting: Emergency Medicine

## 2016-11-10 DIAGNOSIS — R110 Nausea: Secondary | ICD-10-CM | POA: Diagnosis not present

## 2016-11-10 DIAGNOSIS — Z853 Personal history of malignant neoplasm of breast: Secondary | ICD-10-CM | POA: Insufficient documentation

## 2016-11-10 DIAGNOSIS — N183 Chronic kidney disease, stage 3 (moderate): Secondary | ICD-10-CM | POA: Insufficient documentation

## 2016-11-10 DIAGNOSIS — Z950 Presence of cardiac pacemaker: Secondary | ICD-10-CM | POA: Diagnosis not present

## 2016-11-10 DIAGNOSIS — I499 Cardiac arrhythmia, unspecified: Secondary | ICD-10-CM | POA: Diagnosis not present

## 2016-11-10 DIAGNOSIS — I5032 Chronic diastolic (congestive) heart failure: Secondary | ICD-10-CM | POA: Diagnosis not present

## 2016-11-10 DIAGNOSIS — I130 Hypertensive heart and chronic kidney disease with heart failure and stage 1 through stage 4 chronic kidney disease, or unspecified chronic kidney disease: Secondary | ICD-10-CM | POA: Insufficient documentation

## 2016-11-10 DIAGNOSIS — E1140 Type 2 diabetes mellitus with diabetic neuropathy, unspecified: Secondary | ICD-10-CM | POA: Diagnosis not present

## 2016-11-10 DIAGNOSIS — I2510 Atherosclerotic heart disease of native coronary artery without angina pectoris: Secondary | ICD-10-CM | POA: Diagnosis not present

## 2016-11-10 DIAGNOSIS — I4891 Unspecified atrial fibrillation: Secondary | ICD-10-CM | POA: Insufficient documentation

## 2016-11-10 LAB — HEPATIC FUNCTION PANEL
ALBUMIN: 3.5 g/dL (ref 3.5–5.0)
ALT: 14 U/L (ref 14–54)
AST: 25 U/L (ref 15–41)
Alkaline Phosphatase: 88 U/L (ref 38–126)
BILIRUBIN DIRECT: 0.3 mg/dL (ref 0.1–0.5)
Indirect Bilirubin: 1.3 mg/dL — ABNORMAL HIGH (ref 0.3–0.9)
Total Bilirubin: 1.6 mg/dL — ABNORMAL HIGH (ref 0.3–1.2)
Total Protein: 6.9 g/dL (ref 6.5–8.1)

## 2016-11-10 LAB — BASIC METABOLIC PANEL
Anion gap: 13 (ref 5–15)
BUN: 19 mg/dL (ref 6–20)
CALCIUM: 9.4 mg/dL (ref 8.9–10.3)
CO2: 23 mmol/L (ref 22–32)
CREATININE: 1.44 mg/dL — AB (ref 0.44–1.00)
Chloride: 103 mmol/L (ref 101–111)
GFR calc non Af Amer: 32 mL/min — ABNORMAL LOW (ref >60)
GFR, EST AFRICAN AMERICAN: 37 mL/min — AB (ref >60)
Glucose, Bld: 135 mg/dL — ABNORMAL HIGH (ref 65–99)
Potassium: 3.7 mmol/L (ref 3.5–5.1)
SODIUM: 139 mmol/L (ref 135–145)

## 2016-11-10 LAB — PROTIME-INR
INR: 1.59
PROTHROMBIN TIME: 19.2 s — AB (ref 11.4–15.2)

## 2016-11-10 LAB — MAGNESIUM: Magnesium: 1.9 mg/dL (ref 1.7–2.4)

## 2016-11-10 LAB — LIPASE, BLOOD: Lipase: 38 U/L (ref 11–51)

## 2016-11-10 LAB — I-STAT TROPONIN, ED: Troponin i, poc: 0.01 ng/mL (ref 0.00–0.08)

## 2016-11-10 LAB — TROPONIN I: Troponin I: <0.03 ng/mL (ref <0.03)

## 2016-11-10 LAB — CBC
HCT: 38.6 % (ref 36.0–46.0)
Hemoglobin: 12.3 g/dL (ref 12.0–15.0)
MCH: 27.3 pg (ref 26.0–34.0)
MCHC: 31.9 g/dL (ref 30.0–36.0)
MCV: 85.6 fL (ref 78.0–100.0)
PLATELETS: 305 10*3/uL (ref 150–400)
RBC: 4.51 MIL/uL (ref 3.87–5.11)
RDW: 14.1 % (ref 11.5–15.5)
WBC: 4.4 10*3/uL (ref 4.0–10.5)

## 2016-11-10 MED ORDER — METOPROLOL SUCCINATE 12.5 MG HALF TABLET
12.5000 mg | ORAL_TABLET | Freq: Every day | ORAL | Status: DC
  Administered 2016-11-10: 12.5 mg via ORAL
  Filled 2016-11-10: qty 1

## 2016-11-10 MED ORDER — METOPROLOL TARTRATE 5 MG/5ML IV SOLN
5.0000 mg | Freq: Once | INTRAVENOUS | Status: AC
  Administered 2016-11-10: 5 mg via INTRAVENOUS
  Filled 2016-11-10: qty 5

## 2016-11-10 MED ORDER — METOPROLOL TARTRATE 25 MG PO TABS
25.0000 mg | ORAL_TABLET | Freq: Once | ORAL | Status: AC
  Administered 2016-11-10: 25 mg via ORAL
  Filled 2016-11-10: qty 1

## 2016-11-10 MED ORDER — HYDROCORTISONE 0.5 % EX CREA
TOPICAL_CREAM | Freq: Two times a day (BID) | CUTANEOUS | Status: DC
  Administered 2016-11-10: 1 via TOPICAL
  Filled 2016-11-10: qty 28.35

## 2016-11-10 MED ORDER — AMIODARONE HCL 200 MG PO TABS
200.0000 mg | ORAL_TABLET | Freq: Every day | ORAL | Status: DC
  Administered 2016-11-10: 200 mg via ORAL
  Filled 2016-11-10: qty 1

## 2016-11-10 NOTE — ED Notes (Signed)
Place PT in gown. Call bed at bed side.

## 2016-11-10 NOTE — ED Notes (Signed)
Pt ambulated in room without issue, without assistance, with her cane.

## 2016-11-10 NOTE — ED Notes (Signed)
PT went to x-ray

## 2016-11-10 NOTE — Discharge Instructions (Addendum)
It is nice taking care of you today! It is unclear what is causing your nausea. The x-ray of your abdomen is normal. Constipation could cause nausea. Other possible cause could be amiodarone but it is very important that you stay on it until you see your audiologist. You can continue your home Phenergan for nausea. We recommend you call your cardiologist office in the morning for hospital follow up.   Atrial fibrillation/irregular heart beat: Continue taking amiodarone and metoprolol. Take 5 mg of warfarin today, then resume your regular dose tomorrow. Please follow up at your Warfarin clinic as soon as possible.  We also recommend frequent small meals.  Please seek immediate care if you have chest pain, shortness of breath, palpitation, weakness in your arms or legs, slurred speech or other symptoms concerning to you!

## 2016-11-10 NOTE — ED Notes (Signed)
Pt to xray

## 2016-11-10 NOTE — ED Notes (Signed)
PT did not have to use RR at this time  

## 2016-11-10 NOTE — ED Notes (Signed)
Pt in xray

## 2016-11-10 NOTE — ED Triage Notes (Signed)
Patient comes in per [facility] with c/o nausea. Per EMS patient is in afibrvr, HR 120-160. Patient denies any other c.o than nausea. No palpitations or cp. Patient is on blood thinners. Poss hx of afib, unknown for sure per patient. Patient has pacemaker. Ems v/s 170/80, 97% RA, 18 RR, 149 fsbs. Ems ekg showed afibrvr.

## 2016-11-10 NOTE — ED Provider Notes (Signed)
[facility] DEPT Provider Note   CSN: [id number] Arrival date & time: 11/10/16  0900  History   Chief Complaint Chief Complaint  Patient presents with  . Atrial Fibrillation  . Nausea   HPI [name] [name] is a 83 y.o. female.  HPI [name] [name] is an 83 year old female with history of paroxysmal atrial fibrillation, PAH, PE, DPM-2, HFpEF, history of breast cancer status post left mastectomy who presented with a one-week off nausea and not feeling well. Patient is states not feeling well over the last 1 week. She couldn't be specific. She also reports nausea for one week. Denies emesis. Daughter thinks she has had nausea since she started amiodarone. However, it appears she had nausea prior to starting amiodarone per office note by her cardiologist on 10/25/2016.  She also reports itching in her back but denies pain. She reports feeling constipated without a bowel movement for the last 3 days.  Off note, patient was hospitalized for one day about a month ago with atrial fibrillation/flutter with RVR. At that time, she converted to sinus rhythm and cardioversion was consulted and she was discharged on amiodarone.  Patient denies headache, vision change, runny nose, sore throat, fever, chills, emesis, chest pain, shortness of breath, palpitation, nausea, vomiting, diarrhea, dysuria, leg pain or swelling, focal weakness or numbness except baseline neuropathy.  Reports taking her medication. Last dose was last night.  Past Medical History:  Diagnosis Date  . Acute gastric ulcer without mention of hemorrhage, perforation, or obstruction 2006   EGD  . Anxiety   . Atrial fibrillation (HCC)    flecainide; coumadin  . Breast cancer ([place])    left  . Carotid artery disease ([place])    34/19: RICA 6-22%, LICA 29-79%  //  Carotiud [place] 89/21: RICA 1-94%; LICA 17-40% >> FU 1 year   . Chronic diastolic heart failure (HCC)    a. echo 10/10: mild LVH, EF 55-60%, mild AI, mild MR, mild to mod  LAE, mild RVE, severe RAE, mod TR, PASP 53  /  b.  Echo 3/14:  Mild LVH, EF 55%, Tr AI, MAC, mild MR, mod LAE, PASP 31, trivial eff. // c. echo 1/17: EF 55-60%, mild AI, MAC, mild MR, moderate BAE, moderate TR  . Chronic lower back pain   . Diabetic peripheral neuropathy ([place]) 03/09/2015  . Diverticulosis   . Esophagitis, unspecified 2006   EGD  . Hiatal hernia 2006   EGD  . History of nuclear stress test    a. Myoview 2/12: EF 69%, no scar or ischemia  . Hx of adenomatous colonic polyps   . Hypertension   . Mediastinal lymphadenopathy   . Mitral regurgitation    mild, echo, October, 2010  . Osteopenia   . Presence of permanent cardiac pacemaker   . Pulmonary embolus [facility]) 2006   2006, with DVT  . Pulmonary HTN    53 mmHg echo, 2010, moderate TR, mild right ventricular enlargement  . Tachy-brady syndrome ([place])    s/p pacer 07/2009  . Thyroid nodule    non-neoplastic goiter  . Type II diabetes mellitus ([place])   . Venous insufficiency    Chronic    Patient Active Problem List   Diagnosis Date Noted  . Cognitive change   . Atrial flutter ([place]) 10/14/2016  . A-fib ([place]) 10/14/2016  . Dysuria 10/10/2016  . Chronic back pain 07/28/2016  . Diabetes mellitus type 2, noninsulin dependent ([place])   . CRD (chronic renal disease), stage III 04/12/2016  .  Acute on chronic renal insufficiency 04/12/2016  . Chronic anticoagulation-Coumadin 04/12/2016  . Hypertensive heart disease 03/28/2016  . Anemia, iron deficiency 10/22/2015  . Diabetic peripheral neuropathy ([place]) 03/09/2015  . Chronic diastolic CHF (congestive heart failure) ([place]) 09/07/2014  . Sick sinus syndrome ([place]) 03/09/2014  . Hypertension 06/21/2013  . Osteopenia   . Tachy-brady syndrome ([place])   . Atrial fibrillation ([place])   . Pulmonary HTN   . History of pulmonary embolism   . Carotid artery disease ([place])   . History of breast cancer   . Ejection fraction-55-60% by echo Jan 2017   . Pacemaker-Medtronic   . Syncope     . Mitral regurgitation   . Mediastinal lymphadenopathy   . THYROID NODULE, HX OF 07/19/2009  . Diabetes mellitus ([place]) 06/13/2008    Past Surgical History:  Procedure Laterality Date  . ABDOMINAL HYSTERECTOMY    . ANKLE FRACTURE SURGERY Right   . BACK SURGERY    . BREAST LUMPECTOMY Left   . FRACTURE SURGERY    . INSERT / REPLACE / REMOVE PACEMAKER  06/30/2009   MDT Adalpta L implanted by Dr Olevia Perches  . LUMBAR LAMINECTOMY  1973, 06/22/2011   "no hardware either time"    OB History    No data available       Home Medications    Prior to Admission medications   Medication Sig Start Date End Date Taking? Authorizing Provider  amiodarone (PACERONE) 200 MG tablet Take 1 tablet (200 mg total) by mouth daily. 10/15/16  Yes Florencia Reasons, MD  furosemide (LASIX) 80 MG tablet Take 0.5 tablets (40 mg total) by mouth 2 (two) times daily. 04/16/16  Yes Florencia Reasons, MD  HYDROcodone-acetaminophen (NORCO/VICODIN) 5-325 MG tablet Take 0.5-1 tablets by mouth 2 (two) times daily as needed for moderate pain. 10/10/16  Yes Hoyt Koch, MD  hydrocortisone 2.5 % cream Apply 1 application topically as needed (for itching).   Yes Historical Provider, MD  metFORMIN (GLUCOPHAGE) 500 MG tablet Take 500 mg by mouth daily with breakfast.    Yes Historical Provider, MD  metoprolol succinate (TOPROL-XL) 25 MG 24 hr tablet TAKE 1/2 (12.5 MG) TABLET BY MOUTH DAILY 12/08/15  Yes Dorothy Spark, MD  potassium chloride SA (K-DUR,KLOR-CON) 20 MEQ tablet Take 20 mEq by mouth daily.   Yes Historical Provider, MD  pyridoxine (B-6) 200 MG tablet Take 200 mg by mouth daily.   Yes Historical Provider, MD  vitamin B-12 (CYANOCOBALAMIN) 500 MCG tablet Take 500 mcg by mouth daily.    Yes Historical Provider, MD  warfarin (COUMADIN) 5 MG tablet Take 0.5-1 tablets (2.5-5 mg total) by mouth See admin instructions. 2.5 mg daily on Sun/Mon/Tues/Wed/Fri/Sat and 5 mg on Thurs 04/16/16  Yes Florencia Reasons, MD  ALPRAZolam Duanne Moron) 0.25 MG tablet  Take 1 tablet (0.25 mg total) by mouth 2 (two) times daily as needed for anxiety. 10/24/16   Hoyt Koch, MD  promethazine (PHENERGAN) 25 MG tablet Take 1 tablet (25 mg total) by mouth every 8 (eight) hours as needed for nausea or vomiting. 10/22/16   Hoyt Koch, MD    Family History Family History  Problem Relation Age of Onset  . Heart disease Mother   . Hypertension Mother   . COPD Father   . Hypertension Father   . Hypertension Sister   . Hypertension Brother   . Hypertension Daughter   . Colon cancer Neg Hx   . Heart attack Neg Hx   . Stroke Neg  Hx     Social History Social History  Substance Use Topics  . Smoking status: Former Smoker    Packs/day: 1.00    Years: 47.00    Types: Cigarettes    Quit date: 10/26/1998  . Smokeless tobacco: Never Used  . Alcohol use Yes     Comment: 10/14/2016 "I'll have a beer q once in awhile"     Allergies   Flecainide; Morphine and related; and Cephalexin   Review of Systems Review of Systems  Constitutional: Negative for chills and fever.  HENT: Negative for ear pain and sore throat.   Eyes: Negative for pain and visual disturbance.  Respiratory: Negative for cough and shortness of breath.   Cardiovascular: Negative for chest pain and palpitations.  Gastrointestinal: Positive for nausea. Negative for abdominal pain and vomiting.  Genitourinary: Negative for dysuria and hematuria.  Musculoskeletal: Negative for arthralgias and back pain.  Skin: Negative for color change and rash.  Neurological: Negative for seizures and syncope.  All other systems reviewed and are negative.  Physical Exam Updated Vital Signs BP 128/85   Pulse 75   Temp 98.1 F (36.7 C) (Oral)   Resp 24   Ht 6' (1.829 m)   Wt 99.8 kg   SpO2 97%   BMI 29.84 kg/m   Physical Exam GEN: appears well, no apparent distress. Head: normocephalic and atraumatic  Eyes: conjunctiva without injection, sclera anicteric Nares: no rhinorrhea,  congestion or erythema Oropharynx: mmm without erythema or exudation HEM: negative for cervical or periauricular lymphadenopathies CVS: Irregularly irregular, ranges from 110-140's, no murmurs, no edema,  cap refills < 2 secs RESP: speaks in full sentence, no IWOB, good air movement bilaterally, CTAB GI: BS present & normal, soft, NTND, no guarding, no rebound, no mass GU: no suprapubic or CVA tenderness MSK: no focal tenderness or notable swelling SKIN: no apparent skin lesion over her back NEURO: alert and oiented appropriately, no gross defecits  PSYCH: euthymic mood with congruent affect  ED Treatments / Results  Labs (all labs ordered are listed, but only abnormal results are displayed) Labs Reviewed  BASIC METABOLIC PANEL - Abnormal; Notable for the following:       Result Value   Glucose, Bld 135 (*)    Creatinine, Ser 1.44 (*)    GFR calc non Af Amer 32 (*)    GFR calc Af Amer 37 (*)    All other components within normal limits  PROTIME-INR - Abnormal; Notable for the following:    Prothrombin Time 19.2 (*)    All other components within normal limits  HEPATIC FUNCTION PANEL - Abnormal; Notable for the following:    Total Bilirubin 1.6 (*)    Indirect Bilirubin 1.3 (*)    All other components within normal limits  CBC  TROPONIN I  MAGNESIUM  LIPASE, BLOOD  TROPONIN I  URINALYSIS, ROUTINE W REFLEX MICROSCOPIC  I-STAT TROPOININ, ED    EKG  EKG Interpretation  Date/Time:  Sunday November 10 2016 09:08:03 EST Ventricular Rate:  136 PR Interval:    QRS Duration: 81 QT Interval:  348 QTC Calculation: 520 R Axis:   58 Text Interpretation:  Atrial fibrillation with rapid ventricular response ST depression, probably rate related Since last tracing, patient appears to have atrial fibrillation rather than flutter Confirmed by [facility] MD, [place] ([id number]) on 11/10/2016 10:14:25 AM       Radiology Dg Chest 2 View  Result Date: 11/10/2016 CLINICAL DATA:  Nausea, AFib  with RVR EXAM: CHEST  2 VIEW COMPARISON:  10/14/2016 FINDINGS: Lungs are clear. Biapical pleural-parenchymal scarring. No pleural effusion or pneumothorax. Cardiomegaly.  Left subclavian pacemaker. Moderate hiatal hernia. Degenerative changes of the visualized thoracolumbar spine. Old right posterior rib fracture deformity. IMPRESSION: No evidence of acute cardiopulmonary disease. Electronically Signed   By: Julian Hy M.D.   On: 11/10/2016 10:04   Dg Abdomen 1 View  Result Date: 11/10/2016 CLINICAL DATA:  Nausea. EXAM: ABDOMEN - 1 VIEW COMPARISON:  None. FINDINGS: Nonobstructive bowel gas pattern. No free air or organomegaly. No acute bony abnormality. IMPRESSION: No acute findings. Electronically Signed   By: Rolm Baptise M.D.   On: 11/10/2016 12:43    Procedures Procedures (including critical care time)  Medications Ordered in ED Medications  hydrocortisone cream 0.5 % (1 application Topical Given 11/10/16 1312)  amiodarone (PACERONE) tablet 200 mg (200 mg Oral Given 11/10/16 1312)  metoprolol succinate (TOPROL-XL) 24 hr tablet 12.5 mg (12.5 mg Oral Given 11/10/16 1312)  metoprolol (LOPRESSOR) injection 5 mg (5 mg Intravenous Given 11/10/16 1006)  metoprolol tartrate (LOPRESSOR) tablet 25 mg (25 mg Oral Given 11/10/16 1507)     Initial Impression / Assessment and Plan / ED Course  I have reviewed the triage vital signs and the nursing notes.  Pertinent labs & imaging results that were available during my care of the patient were reviewed by me and considered in my medical decision making (see chart for details).  [name] [name] is 83 year old female with history of paroxysmal atrial fibrillation, PAH, PE, DPM-2, HFpEF, history of breast cancer status post left mastectomy who presented with a one-week off nausea and not feeling well. Patient was in A. fib with RVR on presentation to ED. But she is asymptomatic except for the nausea. She states taking promethazine without improvement  in her nausea. She denies emesis but she reports constipation. Last bowel movement about 3 days ago. Doubt ACS without chest pain, dyspnea or palpitation and negative troponin and nonischemic EKG. CXR without acute cardiopulmonary process to think of pneumonia or CHF. Doubt PE while on warfarin but INR was 1.59. Her previous INRs was 2.7 six  days ago.  She reports good compliance with the warfarin. She is hemodynamically stable. RVR resolved with metoprolol 5 mg IV. Home metoprolol and amiodarone resumed while in ED. Heart rate in low 100's. Discussed the patient with cardiology on call for [facility], Dr. Domenic Polite, who recommended continuing amiodarone and follow up with Dr. Rayann Heman as an outpatient if hepatic function is normal, which it is.  Advised patient to take 5 mg of warfarin today and continuing her usual dose after that. Recommended follow-up at the warfarin clinic. Recommended calling cardiology office in the morning for hospital follow up. Discussed return precautions including chest pain, shortness of breath, palpitation, focal weakness or numbness or other symptoms concerning to her.  Constipation: KUB negative. Patient had a normal bowel movement here.  Final Clinical Impressions(s) / ED Diagnoses   Final diagnoses:  Atrial fibrillation, unspecified type ([place])  Nausea    New Prescriptions New Prescriptions   No medications on file     Mercy Riding, MD 11/10/16 1546    Gareth Morgan, MD 11/13/16 1000

## 2016-11-11 ENCOUNTER — Telehealth (HOSPITAL_COMMUNITY): Payer: Self-pay | Admitting: *Deleted

## 2016-11-11 NOTE — Telephone Encounter (Signed)
Spoke with pt regarding ED visit. Pt stated that she will keep her current follow up with Dr. Rayann Heman and see her primary care for the nausea.

## 2016-11-14 ENCOUNTER — Ambulatory Visit (INDEPENDENT_AMBULATORY_CARE_PROVIDER_SITE_OTHER): Payer: Medicare HMO | Admitting: Neurology

## 2016-11-14 ENCOUNTER — Telehealth: Payer: Self-pay | Admitting: Neurology

## 2016-11-14 ENCOUNTER — Encounter: Payer: Self-pay | Admitting: Neurology

## 2016-11-14 VITALS — BP 150/68 | HR 76 | Resp 16 | Ht 72.0 in | Wt 220.0 lb

## 2016-11-14 DIAGNOSIS — E1142 Type 2 diabetes mellitus with diabetic polyneuropathy: Secondary | ICD-10-CM | POA: Diagnosis not present

## 2016-11-14 DIAGNOSIS — E538 Deficiency of other specified B group vitamins: Secondary | ICD-10-CM

## 2016-11-14 DIAGNOSIS — R413 Other amnesia: Secondary | ICD-10-CM | POA: Insufficient documentation

## 2016-11-14 HISTORY — DX: Other amnesia: R41.3

## 2016-11-14 MED ORDER — MEMANTINE HCL 28 X 5 MG & 21 X 10 MG PO TABS: ORAL_TABLET | ORAL | 0 refills | Status: DC

## 2016-11-14 MED ORDER — DONEPEZIL HCL 5 MG PO TABS: 5.0000 mg | ORAL_TABLET | Freq: Every day | ORAL | 1 refills | Status: DC

## 2016-11-14 NOTE — Patient Instructions (Signed)
   Begin Aricept (donepezil) at 5 mg at night for one month. If this medication is well-tolerated, please call our office and we will call in a prescription for the 10 mg tablets. Look out for side effects that may include nausea, diarrhea, weight loss, or stomach cramps. This medication will also cause a runny nose, therefore there is no need for allergy medications for this purpose.  

## 2016-11-14 NOTE — Telephone Encounter (Signed)
I called the pharmacist. They indicate that the Aricept can promote QT interval prolongation. There can be issues with using amiodarone and Aricept together.  I will stop the Aricept, I will call the patient, I gave her verbal order to initiate Namenda. She will get a titration pack.  I called the patient and informed her that we had discontinued Aricept, we will start Namenda.  The patient did not recall that we had discussed that she has a memory problem several hours ago.

## 2016-11-14 NOTE — Progress Notes (Signed)
Reason for visit: Memory disturbance  Referring physician: [facility]  [name] [name] is a 83 y.o. female  History of present illness:  [name] [name] is an 83 year old right-handed black female with a history of borderline diabetes and she has had a recent hospitalization for atrial fibrillation. This occurred on 10/14/2016. During that hospitalization, [name] [name] was given medications for nausea, she was taken off of her gabapentin, and subsequently she developed what sounds like a low-grade delirium state. [name] [name] became somewhat agitated, she was pulling out IVs. A CT scan of [name] brain was done, this has been reviewed online and appears to show fairly extensive chronic white matter changes. [name] [name] has been treated with Coumadin for her atrial fibrillation. She has returned home, she is now functioning at her usual baseline. She remains off of gabapentin and her peripheral neuropathy discomfort has significantly worsened and her back pain has returned. [name] [name] walks with a cane, she is not having recent falls. [name] [name] reports numbness associated with her peripheral neuropathy up to [name] knees bilaterally, otherwise no new numbness or new weakness has been noted. [name] [name] generally will sleep fairly well at night, she denies any issues controlling [name] bowels or [name] bladder. [name] [name] comes to [name] office today with her daughter who does report some problems with repeating things, and some difficulty with naming on [name] part of [name] [name]. [name] [name] does not operate a motor vehicle mainly because of her peripheral neuropathy. She is able to manage her medications and appointments and her finances.  Past Medical History:  Diagnosis Date  . Acute gastric ulcer without mention of hemorrhage, perforation, or obstruction 2006   EGD  . Anxiety   . Atrial fibrillation (HCC)    flecainide; coumadin  . Breast cancer ([place])    left  . Carotid artery disease ([place])    87/86:  RICA 7-67%, LICA 20-94%  //  Carotiud [place] 70/96: RICA 2-83%; LICA 66-29% >> FU 1 year   . Chronic diastolic heart failure (HCC)    a. echo 10/10: mild LVH, EF 55-60%, mild AI, mild MR, mild to mod LAE, mild RVE, severe RAE, mod TR, PASP 53  /  b.  Echo 3/14:  Mild LVH, EF 55%, Tr AI, MAC, mild MR, mod LAE, PASP 31, trivial eff. // c. echo 1/17: EF 55-60%, mild AI, MAC, mild MR, moderate BAE, moderate TR  . Chronic lower back pain   . Diabetic peripheral neuropathy ([place]) 03/09/2015  . Diverticulosis   . Esophagitis, unspecified 2006   EGD  . Hiatal hernia 2006   EGD  . History of nuclear stress test    a. Myoview 2/12: EF 69%, no scar or ischemia  . Hx of adenomatous colonic polyps   . Hypertension   . Mediastinal lymphadenopathy   . Mitral regurgitation    mild, echo, October, 2010  . Osteopenia   . Presence of permanent cardiac pacemaker   . Pulmonary embolus [facility]) 2006   2006, with DVT  . Pulmonary HTN    53 mmHg echo, 2010, moderate TR, mild right ventricular enlargement  . Tachy-brady syndrome ([place])    s/p pacer 07/2009  . Thyroid nodule    non-neoplastic goiter  . Type II diabetes mellitus ()   . Venous insufficiency    Chronic    Past Surgical History:  Procedure Laterality Date  . ABDOMINAL HYSTERECTOMY    . ANKLE FRACTURE SURGERY Right   . BACK SURGERY    .  BREAST LUMPECTOMY Left   . FRACTURE SURGERY    . INSERT / REPLACE / REMOVE PACEMAKER  06/30/2009   MDT Adalpta L implanted by Dr Olevia Perches  . LUMBAR LAMINECTOMY  1973, 06/22/2011   "no hardware either time"    Family History  Problem Relation Age of Onset  . Heart disease Mother   . Hypertension Mother   . COPD Father   . Hypertension Father   . Hypertension Sister   . Hypertension Brother   . Hypertension Daughter   . Colon cancer Neg Hx   . Heart attack Neg Hx   . Stroke Neg Hx     Social history:  reports that she quit smoking about 18 years ago. Her smoking use included Cigarettes. She has a 47.00  pack-year smoking history. She has never used smokeless tobacco. She reports that she drinks alcohol. She reports that she does not use drugs.  Medications:  Prior to Admission medications   Medication Sig Start Date End Date Taking? Authorizing Provider  ALPRAZolam (XANAX) 0.25 MG tablet Take 1 tablet (0.25 mg total) by mouth 2 (two) times daily as needed for anxiety. 10/24/16  Yes Hoyt Koch, MD  amiodarone (PACERONE) 200 MG tablet Take 1 tablet (200 mg total) by mouth daily. 10/15/16  Yes Florencia Reasons, MD  furosemide (LASIX) 80 MG tablet Take 0.5 tablets (40 mg total) by mouth 2 (two) times daily. 04/16/16  Yes Florencia Reasons, MD  HYDROcodone-acetaminophen (NORCO/VICODIN) 5-325 MG tablet Take 0.5-1 tablets by mouth 2 (two) times daily as needed for moderate pain. 10/10/16  Yes Hoyt Koch, MD  hydrocortisone 2.5 % cream Apply 1 application topically as needed (for itching).   Yes Historical Provider, MD  metFORMIN (GLUCOPHAGE) 500 MG tablet Take 500 mg by mouth daily with breakfast.    Yes Historical Provider, MD  metoprolol succinate (TOPROL-XL) 25 MG 24 hr tablet TAKE 1/2 (12.5 MG) TABLET BY MOUTH DAILY 12/08/15  Yes Dorothy Spark, MD  potassium chloride SA (K-DUR,KLOR-CON) 20 MEQ tablet Take 20 mEq by mouth daily.   Yes Historical Provider, MD  promethazine (PHENERGAN) 25 MG tablet Take 1 tablet (25 mg total) by mouth every 8 (eight) hours as needed for nausea or vomiting. 10/22/16  Yes Hoyt Koch, MD  pyridoxine (B-6) 200 MG tablet Take 200 mg by mouth daily.   Yes Historical Provider, MD  vitamin B-12 (CYANOCOBALAMIN) 500 MCG tablet Take 500 mcg by mouth daily.    Yes Historical Provider, MD  warfarin (COUMADIN) 5 MG tablet Take 0.5-1 tablets (2.5-5 mg total) by mouth See admin instructions. 2.5 mg daily on Sun/Mon/Tues/Wed/Fri/Sat and 5 mg on Thurs 04/16/16  Yes Florencia Reasons, MD      Allergies  Allergen Reactions  . Flecainide Shortness Of Breath and Other (See Comments)     toxicity  . Morphine And Related Other (See Comments)    Family requests no morphine due to past reaction. Pt stated it made her sick.  . Cephalexin Nausea And Vomiting    ROS:  Out of a complete 14 system review of symptoms, [name] [name] complains only of [name] following symptoms, and all other reviewed systems are negative.  Cold intolerance, heat intolerance Constipation, diarrhea, nausea Back pain Itching Numbness  Blood pressure (!) 150/68, pulse 76, resp. rate 16, height 6' (1.829 m), weight 220 lb (99.8 kg).  Physical Exam  General: [name] [name] is alert and cooperative at [name] time of [name] examination.  Eyes: Pupils are equal, round,  and reactive to light. Discs are flat bilaterally.  Neck: [name] neck is supple, no carotid bruits are noted.  Respiratory: [name] respiratory examination is clear.  Cardiovascular: [name] cardiovascular examination reveals a regular rate and rhythm, no obvious murmurs or rubs are noted.  Skin: Extremities are without significant edema.  Neurologic Exam  Mental status: [name] [name] is alert and oriented x 3 at [name] time of [name] examination. [name] [name] has apparent normal recent and remote memory, with an apparently normal attention span and concentration ability. Mini-Mental Status Examination done today shows a total score 25/30. [name] [name] is able to name 9 animals in 30 seconds.  Cranial nerves: Facial symmetry is present. There is good sensation of [name] face to pinprick and soft touch bilaterally. [name] strength of [name] facial muscles and [name] muscles to head turning and shoulder shrug are normal bilaterally. Speech is well enunciated, no aphasia or dysarthria is noted. Extraocular movements are full. Visual fields are full. [name] tongue is midline, and [name] [name] has symmetric elevation of [name] soft palate. No obvious hearing deficits are noted.  Motor: [name] motor testing reveals 5 over 5 strength of all 4 extremities. Good symmetric motor tone is noted  throughout.  Sensory: Sensory testing is intact to pinprick, soft touch, vibration sensation, and position sense on [name] upper extremities. With [name] lower extremities, there is a stocking pattern pinprick sensory deficit up to [name] knees bilaterally. There is significant impairment of vibration sensation and position sense in both feet. No evidence of extinction is noted.  Coordination: Cerebellar testing reveals good finger-nose-finger and heel-to-shin bilaterally.  Gait and station: Gait is slightly wide-based, [name] [name] uses a cane for ambulation. Tandem gait is unsteady. Romberg is negative. No drift is seen.  Reflexes: Deep tendon reflexes are symmetric, but are depressed bilaterally. Toes are downgoing bilaterally.   CT head 10/14/16:  IMPRESSION: 1. No acute intracranial or calvarial findings. 2. Stable chronic small vessel ischemic changes and generalized Atrophy.  * CT scan images were reviewed online. I agree with [name] written report.    Assessment/Plan:  1. Mild memory disturbance  2. Peripheral neuropathy  3. Gait disturbance  4. Cerebrovascular disease by CT  5. Atrial fibrillation  [name] [name] appears to have a baseline mild memory disturbance. [name] [name] may have had a low-grade delirium state associated with [name] pre-existing memory problem and with medications given during that hospitalization. [name] [name] likely could go back on her gabapentin for her peripheral neuropathy discomfort and back pain. [name] [name] will undergo further blood work today, she will have a carotid Doppler study. [name] [name] will have Aricept added to [name] regimen for [name] memory, she will follow-up in 6 months.  Jill Alexanders MD 11/14/2016 7:28 AM  [facility] [address] [place], [place] [address]  Phone [phone] Fax [phone]

## 2016-11-14 NOTE — Telephone Encounter (Signed)
Dr Willis- please advise 

## 2016-11-14 NOTE — Telephone Encounter (Signed)
[facility] [phone] rec'd RX today for donepezil (ARICEPT) 5 MG tablet it interacts with amiodarone (PACERONE) 200 MG tablet . Please call

## 2016-11-15 LAB — VITAMIN B12: Vitamin B-12: 857 pg/mL (ref 232–1245)

## 2016-11-15 LAB — RPR: RPR: NONREACTIVE

## 2016-11-18 ENCOUNTER — Ambulatory Visit (INDEPENDENT_AMBULATORY_CARE_PROVIDER_SITE_OTHER): Payer: Medicare HMO | Admitting: *Deleted

## 2016-11-18 DIAGNOSIS — Z86711 Personal history of pulmonary embolism: Secondary | ICD-10-CM | POA: Diagnosis not present

## 2016-11-18 DIAGNOSIS — I4891 Unspecified atrial fibrillation: Secondary | ICD-10-CM

## 2016-11-18 DIAGNOSIS — I482 Chronic atrial fibrillation, unspecified: Secondary | ICD-10-CM

## 2016-11-18 LAB — POCT INR: INR: 3.6

## 2016-11-19 ENCOUNTER — Telehealth: Payer: Self-pay | Admitting: *Deleted

## 2016-11-19 NOTE — Telephone Encounter (Signed)
Med added

## 2016-11-20 ENCOUNTER — Ambulatory Visit (INDEPENDENT_AMBULATORY_CARE_PROVIDER_SITE_OTHER): Payer: Medicare HMO

## 2016-11-20 ENCOUNTER — Other Ambulatory Visit: Payer: Self-pay | Admitting: Internal Medicine

## 2016-11-20 DIAGNOSIS — R413 Other amnesia: Secondary | ICD-10-CM

## 2016-11-20 MED ORDER — AMIODARONE HCL 200 MG PO TABS: 200.0000 mg | ORAL_TABLET | Freq: Every day | ORAL | 9 refills | Status: DC

## 2016-11-28 ENCOUNTER — Telehealth: Payer: Self-pay | Admitting: Neurology

## 2016-11-28 ENCOUNTER — Encounter: Payer: Self-pay | Admitting: Gastroenterology

## 2016-11-28 NOTE — Telephone Encounter (Signed)
I called the patient. Carotid Doppler study shows left distal internal carotid artery stenosis of 50-69%. The patient is on Coumadin therapy, we will recheck a carotid Doppler study in one year. I discussed this issue with her.

## 2016-11-29 ENCOUNTER — Encounter: Payer: Self-pay | Admitting: Internal Medicine

## 2016-12-02 ENCOUNTER — Ambulatory Visit (INDEPENDENT_AMBULATORY_CARE_PROVIDER_SITE_OTHER): Payer: Medicare HMO | Admitting: *Deleted

## 2016-12-02 ENCOUNTER — Other Ambulatory Visit: Payer: Self-pay | Admitting: Internal Medicine

## 2016-12-02 DIAGNOSIS — I482 Chronic atrial fibrillation, unspecified: Secondary | ICD-10-CM

## 2016-12-02 DIAGNOSIS — Z86711 Personal history of pulmonary embolism: Secondary | ICD-10-CM | POA: Diagnosis not present

## 2016-12-02 DIAGNOSIS — I4891 Unspecified atrial fibrillation: Secondary | ICD-10-CM | POA: Diagnosis not present

## 2016-12-02 LAB — POCT INR: INR: 2.2

## 2016-12-09 ENCOUNTER — Encounter: Payer: Medicare HMO | Admitting: Internal Medicine

## 2016-12-09 ENCOUNTER — Other Ambulatory Visit: Payer: Self-pay | Admitting: Cardiology

## 2016-12-11 ENCOUNTER — Encounter: Payer: Medicare HMO | Admitting: Internal Medicine

## 2016-12-24 DIAGNOSIS — B351 Tinea unguium: Secondary | ICD-10-CM | POA: Diagnosis not present

## 2016-12-24 DIAGNOSIS — L851 Acquired keratosis [keratoderma] palmaris et plantaris: Secondary | ICD-10-CM | POA: Diagnosis not present

## 2016-12-24 DIAGNOSIS — E1142 Type 2 diabetes mellitus with diabetic polyneuropathy: Secondary | ICD-10-CM | POA: Diagnosis not present

## 2016-12-25 ENCOUNTER — Ambulatory Visit (INDEPENDENT_AMBULATORY_CARE_PROVIDER_SITE_OTHER): Payer: Medicare HMO | Admitting: *Deleted

## 2016-12-25 ENCOUNTER — Ambulatory Visit (INDEPENDENT_AMBULATORY_CARE_PROVIDER_SITE_OTHER): Payer: Medicare HMO | Admitting: Internal Medicine

## 2016-12-25 ENCOUNTER — Encounter: Payer: Medicare HMO | Admitting: Internal Medicine

## 2016-12-25 VITALS — BP 132/72 | HR 112 | Ht 72.0 in | Wt 236.5 lb

## 2016-12-25 DIAGNOSIS — I482 Chronic atrial fibrillation, unspecified: Secondary | ICD-10-CM

## 2016-12-25 DIAGNOSIS — Z7901 Long term (current) use of anticoagulants: Secondary | ICD-10-CM

## 2016-12-25 DIAGNOSIS — Z86711 Personal history of pulmonary embolism: Secondary | ICD-10-CM

## 2016-12-25 DIAGNOSIS — I495 Sick sinus syndrome: Secondary | ICD-10-CM | POA: Diagnosis not present

## 2016-12-25 DIAGNOSIS — I4891 Unspecified atrial fibrillation: Secondary | ICD-10-CM

## 2016-12-25 LAB — POCT INR: INR: 2.6

## 2016-12-25 MED ORDER — METOPROLOL SUCCINATE ER 25 MG PO TB24: 25.0000 mg | ORAL_TABLET | Freq: Every day | ORAL | 3 refills | Status: DC

## 2016-12-25 NOTE — Patient Instructions (Signed)
Medication Instructions:  Your physician has recommended you make the following change in your medication: Increase Toprol to 25 mg daily.  Labwork: None Ordered   Testing/Procedures: None Ordered   Follow-Up: Your physician recommends that you schedule a follow-up appointment in: one week with Tommye Standard  Any Other Special Instructions Will Be Listed Below (If Applicable).     If you need a refill on your cardiac medications before your next appointment, please call your pharmacy.

## 2016-12-25 NOTE — Progress Notes (Signed)
PCP:  Hoyt Koch, MD Primary Cardiologist:  Previously Dr Ron Parker  The patient presents today for routine electrophysiology followup.  Doing reasonably well.  She is having more afib.  She is SOB and has fatigue.   Today, she denies symptoms of palpitations, chest pain, shortness of breath, orthopnea, PND, lower extremity edema, dizziness, presyncope, syncope, or neurologic sequela.  The patient feels that she is tolerating medications without difficulties and is otherwise without complaint today.   Past Medical History:  Diagnosis Date  . Acute gastric ulcer without mention of hemorrhage, perforation, or obstruction 2006   EGD  . Anxiety   . Atrial fibrillation (HCC)    flecainide; coumadin  . Breast cancer ([place])    left  . Carotid artery disease ([place])    74/12: RICA 8-78%, LICA 67-67%  //  Carotiud [place] 20/94: RICA 7-09%; LICA 62-83% >> FU 1 year   . Chronic diastolic heart failure (HCC)    a. echo 10/10: mild LVH, EF 55-60%, mild AI, mild MR, mild to mod LAE, mild RVE, severe RAE, mod TR, PASP 53  /  b.  Echo 3/14:  Mild LVH, EF 55%, Tr AI, MAC, mild MR, mod LAE, PASP 31, trivial eff. // c. echo 1/17: EF 55-60%, mild AI, MAC, mild MR, moderate BAE, moderate TR  . Chronic lower back pain   . Diabetic peripheral neuropathy ([place]) 03/09/2015  . Diverticulosis   . Esophagitis, unspecified 2006   EGD  . Hiatal hernia 2006   EGD  . History of nuclear stress test    a. Myoview 2/12: EF 69%, no scar or ischemia  . Hx of adenomatous colonic polyps   . Hypertension   . Mediastinal lymphadenopathy   . Memory difficulty 11/14/2016  . Mitral regurgitation    mild, echo, October, 2010  . Osteopenia   . Presence of permanent cardiac pacemaker   . Pulmonary embolus [facility]) 2006   2006, with DVT  . Pulmonary HTN    53 mmHg echo, 2010, moderate TR, mild right ventricular enlargement  . Tachy-brady syndrome ([place])    s/p pacer 07/2009  . Thyroid nodule    non-neoplastic goiter  . Type II  diabetes mellitus ([place])   . Venous insufficiency    Chronic   Past Surgical History:  Procedure Laterality Date  . ABDOMINAL HYSTERECTOMY    . ANKLE FRACTURE SURGERY Right   . BACK SURGERY    . BREAST LUMPECTOMY Left   . FRACTURE SURGERY    . INSERT / REPLACE / REMOVE PACEMAKER  06/30/2009   MDT Adalpta L implanted by Dr Olevia Perches  . LUMBAR LAMINECTOMY  1973, 06/22/2011   "no hardware either time"    Current Outpatient Prescriptions  Medication Sig Dispense Refill  . ALPRAZolam (XANAX) 0.25 MG tablet Take 1 tablet (0.25 mg total) by mouth 2 (two) times daily as needed for anxiety. 20 tablet 0  . amiodarone (PACERONE) 200 MG tablet Take 1 tablet (200 mg total) by mouth daily. 30 tablet 9  . furosemide (LASIX) 80 MG tablet Take 0.5 tablets (40 mg total) by mouth 2 (two) times daily. 180 tablet 3  . gabapentin (NEURONTIN) 300 MG capsule Take 300 mg by mouth 2 (two) times daily. Takes 2 tablets twice a day.     Marland Kitchen HYDROcodone-acetaminophen (NORCO/VICODIN) 5-325 MG tablet Take 0.5-1 tablets by mouth 2 (two) times daily as needed for moderate pain. 30 tablet 0  . hydrocortisone 2.5 % cream Apply 1 application topically as needed (  for itching).    . memantine (NAMENDA TITRATION PAK) tablet pack 5 mg/day for =1 week; 5 mg twice daily for =1 week; 15 mg/day given in 5 mg and 10 mg separated doses for =1 week; then 10 mg twice daily 49 tablet 0  . metFORMIN (GLUCOPHAGE) 500 MG tablet Take 500 mg by mouth daily with breakfast.     . metoprolol succinate (TOPROL-XL) 25 MG 24 hr tablet TAKE 1/2 TABLET BY MOUTH DAILY 45 tablet 3  . potassium chloride SA (K-DUR,KLOR-CON) 20 MEQ tablet Take 20 mEq by mouth daily.    . promethazine (PHENERGAN) 25 MG tablet Take 1 tablet (25 mg total) by mouth every 8 (eight) hours as needed for nausea or vomiting. 20 tablet 0  . pyridoxine (B-6) 200 MG tablet Take 200 mg by mouth daily.    . vitamin B-12 (CYANOCOBALAMIN) 500 MCG tablet Take 500 mcg by mouth daily.     Marland Kitchen  warfarin (COUMADIN) 5 MG tablet TAKE 1/2 TABLET EVERY DAY EXCEPT 1 TABLET ON THURSDAY 30 tablet 3   No current facility-administered medications for this visit.     Allergies  Allergen Reactions  . Flecainide Shortness Of Breath and Other (See Comments)    toxicity  . Morphine And Related Other (See Comments)    Family requests no morphine due to past reaction. Pt stated it made her sick.  . Cephalexin Nausea And Vomiting    Social History   Social History  . Marital status: Widowed    Spouse name: N/A  . Number of children: 3  . Years of education: N/A   Occupational History  . Retired     Social History Main Topics  . Smoking status: Former Smoker    Packs/day: 1.00    Years: 47.00    Types: Cigarettes    Quit date: 10/26/1998  . Smokeless tobacco: Never Used  . Alcohol use Yes     Comment: 10/14/2016 "I'll have a beer q once in awhile"  . Drug use: No  . Sexual activity: No   Other Topics Concern  . Not on file   Social History Narrative   Widowed.  Lives in [place] by herself.  Avoids caffeine.  Very active, taking care of her brother who also lives by himself and is in his late 69's.    Family History  Problem Relation Age of Onset  . Heart disease Mother   . Hypertension Mother   . COPD Father   . Hypertension Father   . Hypertension Sister   . Hypertension Brother   . Hypertension Daughter   . Colon cancer Neg Hx   . Heart attack Neg Hx   . Stroke Neg Hx     ROS-  All systems are reviewed and are negative except as outlined in the HPI above  Physical Exam: Vitals:   12/25/16 1539  BP: 132/72  Pulse: (!) 112  SpO2: 97%  Weight: 236 lb 8 oz (107.3 kg)  Height: 6' (1.829 m)    GEN- The patient is well appearing, alert and less confused Head- normocephalic, atraumatic Eyes-  Sclera clear, conjunctiva pink Ears- hearing intact Oropharynx- clear Neck- supple,  Lungs- Clear to ausculation bilaterally, normal work of breathing Chest- pacemaker  pocket is well healed Heart- tachycardic irregular rhythm, no murmurs, rubs or gallops, PMI not laterally displaced GI- soft, NT, ND, + BS Extremities- no clubbing, cyanosis, or edema  ekg today reveals afib, V rate 112 bpm  Pacemaker interrogation- personally reviewed  in detail today,  See PACEART report   Assessment and Plan:  1. Sick sinus syndrome Normal pacemaker function  See Pace Art report No changes today  2. Atrial fibrillation Recently started on amiodarone but continues to have frequent afib Not an ablation candidate Ultimately, we may have to rate control long term Continue couamdin Will increase Toprol to 69m daily Return to see EP PA-C in 2 weeks  3. HTN Stable No change required today  Return to see EP PA-C in 2 weeks.  Will likely need several frequent EP PA-C visits for rate control carelink Return to see me in 3 months  JThompson GrayerMD, FBuffalo Surgery Center LLC3/28/2018 4:00 PM

## 2016-12-26 LAB — CUP PACEART INCLINIC DEVICE CHECK
Brady Statistic AP VP Percent: 1 %
Brady Statistic AP VS Percent: 49 %
Brady Statistic AS VP Percent: 0 %
Brady Statistic AS VS Percent: 51 %
Implantable Lead Implant Date: 20101001
Implantable Lead Location: 753859
Implantable Lead Location: 753860
Lead Channel Impedance Value: 373 Ohm
Lead Channel Pacing Threshold Amplitude: 1 V
Lead Channel Pacing Threshold Pulse Width: 0.52 ms
Lead Channel Sensing Intrinsic Amplitude: 0.5 mV
Lead Channel Sensing Intrinsic Amplitude: 15.67 mV
MDC IDC LEAD IMPLANT DT: 20101001
MDC IDC MSMT BATTERY IMPEDANCE: 960 Ohm
MDC IDC MSMT BATTERY REMAINING LONGEVITY: 64 mo
MDC IDC MSMT BATTERY VOLTAGE: 2.78 V
MDC IDC MSMT LEADCHNL RV IMPEDANCE VALUE: 395 Ohm
MDC IDC PG IMPLANT DT: 20101001
MDC IDC SESS DTM: 20180328195259
MDC IDC SET LEADCHNL RA PACING AMPLITUDE: 2 V
MDC IDC SET LEADCHNL RV PACING AMPLITUDE: 2.5 V
MDC IDC SET LEADCHNL RV PACING PULSEWIDTH: 0.52 ms
MDC IDC SET LEADCHNL RV SENSING SENSITIVITY: 4 mV

## 2016-12-28 ENCOUNTER — Emergency Department (HOSPITAL_COMMUNITY)
Admission: EM | Admit: 2016-12-28 | Discharge: 2016-12-28 | Disposition: A | Discharge status: Home / Self Care | Payer: Medicare HMO | Attending: Emergency Medicine | Admitting: Emergency Medicine

## 2016-12-28 ENCOUNTER — Emergency Department (HOSPITAL_COMMUNITY): Payer: Medicare HMO

## 2016-12-28 ENCOUNTER — Encounter (HOSPITAL_COMMUNITY): Payer: Self-pay | Admitting: Vascular Surgery

## 2016-12-28 DIAGNOSIS — Z79899 Other long term (current) drug therapy: Secondary | ICD-10-CM | POA: Insufficient documentation

## 2016-12-28 DIAGNOSIS — Z87891 Personal history of nicotine dependence: Secondary | ICD-10-CM | POA: Diagnosis not present

## 2016-12-28 DIAGNOSIS — Z853 Personal history of malignant neoplasm of breast: Secondary | ICD-10-CM | POA: Insufficient documentation

## 2016-12-28 DIAGNOSIS — I5032 Chronic diastolic (congestive) heart failure: Secondary | ICD-10-CM | POA: Diagnosis not present

## 2016-12-28 DIAGNOSIS — Z7901 Long term (current) use of anticoagulants: Secondary | ICD-10-CM | POA: Diagnosis not present

## 2016-12-28 DIAGNOSIS — I110 Hypertensive heart disease with heart failure: Secondary | ICD-10-CM | POA: Diagnosis not present

## 2016-12-28 DIAGNOSIS — Z7984 Long term (current) use of oral hypoglycemic drugs: Secondary | ICD-10-CM | POA: Diagnosis not present

## 2016-12-28 DIAGNOSIS — R0602 Shortness of breath: Secondary | ICD-10-CM | POA: Diagnosis not present

## 2016-12-28 DIAGNOSIS — I2510 Atherosclerotic heart disease of native coronary artery without angina pectoris: Secondary | ICD-10-CM | POA: Diagnosis not present

## 2016-12-28 DIAGNOSIS — E119 Type 2 diabetes mellitus without complications: Secondary | ICD-10-CM | POA: Insufficient documentation

## 2016-12-28 LAB — PROTIME-INR
INR: 2.01
PROTHROMBIN TIME: 23.1 s — AB (ref 11.4–15.2)

## 2016-12-28 LAB — BASIC METABOLIC PANEL
Anion gap: 6 (ref 5–15)
BUN: 24 mg/dL — ABNORMAL HIGH (ref 6–20)
CHLORIDE: 103 mmol/L (ref 101–111)
CO2: 29 mmol/L (ref 22–32)
CREATININE: 1.67 mg/dL — AB (ref 0.44–1.00)
Calcium: 9.2 mg/dL (ref 8.9–10.3)
GFR calc non Af Amer: 27 mL/min — ABNORMAL LOW (ref >60)
GFR, EST AFRICAN AMERICAN: 31 mL/min — AB (ref >60)
Glucose, Bld: 146 mg/dL — ABNORMAL HIGH (ref 65–99)
Potassium: 3.8 mmol/L (ref 3.5–5.1)
Sodium: 138 mmol/L (ref 135–145)

## 2016-12-28 LAB — CBC
HEMATOCRIT: 32.9 % — AB (ref 36.0–46.0)
Hemoglobin: 10 g/dL — ABNORMAL LOW (ref 12.0–15.0)
MCH: 25.8 pg — ABNORMAL LOW (ref 26.0–34.0)
MCHC: 30.4 g/dL (ref 30.0–36.0)
MCV: 85 fL (ref 78.0–100.0)
PLATELETS: 284 10*3/uL (ref 150–400)
RBC: 3.87 MIL/uL (ref 3.87–5.11)
RDW: 15.8 % — AB (ref 11.5–15.5)
WBC: 3.7 10*3/uL — AB (ref 4.0–10.5)

## 2016-12-28 LAB — TROPONIN I: Troponin I: <0.03 ng/mL (ref <0.03)

## 2016-12-28 NOTE — ED Notes (Signed)
Pt transported to radiology.

## 2016-12-28 NOTE — ED Triage Notes (Signed)
Pt reports to the ED via [facility] for eval of SOB. Lung sounds en route clear. 12 lead unremarkable. She was seen by her PCP and had one of these episodes but they were not concerned about it. She denies any anxiety prior to the episodes. She was standing at the kitchen sink washing dishes when the SOB began. She states that her SOB has decreased significantly but she still feels a bit SOB. VSS en route. Pt denies any cough, orthopnea, CP, injury to her chest, or falls. She does have a hx of atrial fibrillation. Pt A&Ox4, resp e/u at this time, and skin warm and dry.

## 2016-12-30 ENCOUNTER — Other Ambulatory Visit: Payer: Self-pay | Admitting: Internal Medicine

## 2016-12-30 ENCOUNTER — Telehealth: Payer: Self-pay | Admitting: Internal Medicine

## 2016-12-30 MED ORDER — ALPRAZOLAM 0.25 MG PO TABS
0.2500 mg | ORAL_TABLET | Freq: Two times a day (BID) | ORAL | 0 refills | Status: DC | PRN
Start: 2016-12-30 — End: 2017-01-27

## 2016-12-30 NOTE — Telephone Encounter (Signed)
I spoke with pt's daughter. Pt went to ED for shortness of breath with exertion  12/28/16. Pt was told everything checked out but pt is still complaining of shortness of breath with exertion, no change in symptoms, daughter requesting appt in our office. Pt has been scheduled to see Cecilie Kicks, NP in our office 12/31/16, daughter aware.

## 2016-12-30 NOTE — Telephone Encounter (Signed)
New message  Pt c/o Shortness Of Breath: STAT if SOB developed within the last 24 hours or pt is noticeably SOB on the phone  1. Are you currently SOB (can you hear that pt is SOB on the phone)? Per pt daughter going and coming. When daughter spoke with pt she was experening some SOB  2. How long have you been experiencing SOB? Saturday; per pt daughter pt went to the hospital for SOB  3. Are you SOB when sitting or when up moving around? Moving   4. Are you currently experiencing any other symptoms? Per pt daughter pt felt like she was going to pass out

## 2016-12-31 ENCOUNTER — Encounter: Payer: Self-pay | Admitting: Cardiology

## 2016-12-31 ENCOUNTER — Ambulatory Visit (INDEPENDENT_AMBULATORY_CARE_PROVIDER_SITE_OTHER): Payer: Medicare HMO | Admitting: Cardiology

## 2016-12-31 VITALS — BP 126/62 | HR 60 | Ht 72.0 in | Wt 234.0 lb

## 2016-12-31 DIAGNOSIS — R195 Other fecal abnormalities: Secondary | ICD-10-CM

## 2016-12-31 DIAGNOSIS — Z79899 Other long term (current) drug therapy: Secondary | ICD-10-CM

## 2016-12-31 DIAGNOSIS — Z7901 Long term (current) use of anticoagulants: Secondary | ICD-10-CM | POA: Diagnosis not present

## 2016-12-31 DIAGNOSIS — I5032 Chronic diastolic (congestive) heart failure: Secondary | ICD-10-CM

## 2016-12-31 DIAGNOSIS — R0602 Shortness of breath: Secondary | ICD-10-CM | POA: Diagnosis not present

## 2016-12-31 DIAGNOSIS — Z950 Presence of cardiac pacemaker: Secondary | ICD-10-CM

## 2016-12-31 DIAGNOSIS — I480 Paroxysmal atrial fibrillation: Secondary | ICD-10-CM | POA: Diagnosis not present

## 2016-12-31 DIAGNOSIS — D649 Anemia, unspecified: Secondary | ICD-10-CM

## 2016-12-31 NOTE — ED Provider Notes (Signed)
[facility] DEPT [facility] Provider Note   CSN: [id number] Arrival date & time:        History   Chief Complaint Chief Complaint  Patient presents with  . Shortness of Breath    HPI [name] [name] is a 83 y.o. female.  Patient presents emergency department that episode of acute exertional shortness of breath which occurred this morning.  Her symptoms have since nearly resolved.  No recent fevers or chills.  No cough.  She has had an episode like this before and was seen and evaluated by her primary care physician.  No clear etiology has been found.  EMS when evaluated the patient and found her to have a unremarkable 12-lead and found her lung examination to be clear on their arrival.  No recent orthopnea or chest pain.  No recent injury.  No history DVT or pulmonary embolism.  No unilateral leg swelling.  She is compliant with all of her medications including her amiodarone, Lasix, Coumadin.  She has a history of paroxysmal atrial fibrillation as well as a history of chronic diastolic heart failure.  Denies new edema in her lower extremities.  She feels much better at this time.  She's been in her normal state health earlier this week  The history is provided by the patient.    Past Medical History:  Diagnosis Date  . Acute gastric ulcer without mention of hemorrhage, perforation, or obstruction 2006   EGD  . Anxiety   . Atrial fibrillation (HCC)    flecainide; coumadin  . Breast cancer ([place])    left  . Carotid artery disease ([place])    84/13: RICA 2-44%, LICA 01-02%  //  Carotiud [place] 72/53: RICA 6-64%; LICA 40-34% >> FU 1 year   . Chronic diastolic heart failure (HCC)    a. echo 10/10: mild LVH, EF 55-60%, mild AI, mild MR, mild to mod LAE, mild RVE, severe RAE, mod TR, PASP 53  /  b.  Echo 3/14:  Mild LVH, EF 55%, Tr AI, MAC, mild MR, mod LAE, PASP 31, trivial eff. // c. echo 1/17: EF 55-60%, mild AI, MAC, mild MR, moderate BAE, moderate TR  . Chronic lower back pain   .  Diabetic peripheral neuropathy ([place]) 03/09/2015  . Diverticulosis   . Esophagitis, unspecified 2006   EGD  . Hiatal hernia 2006   EGD  . History of nuclear stress test    a. Myoview 2/12: EF 69%, no scar or ischemia  . Hx of adenomatous colonic polyps   . Hypertension   . Mediastinal lymphadenopathy   . Memory difficulty 11/14/2016  . Mitral regurgitation    mild, echo, October, 2010  . Osteopenia   . Presence of permanent cardiac pacemaker   . Pulmonary embolus [facility]) 2006   2006, with DVT  . Pulmonary HTN    53 mmHg echo, 2010, moderate TR, mild right ventricular enlargement  . Tachy-brady syndrome ([place])    s/p pacer 07/2009  . Thyroid nodule    non-neoplastic goiter  . Type II diabetes mellitus ([place])   . Venous insufficiency    Chronic    Patient Active Problem List   Diagnosis Date Noted  . Memory difficulty 11/14/2016  . Cognitive change   . Atrial flutter ([place]) 10/14/2016  . A-fib ([place]) 10/14/2016  . Dysuria 10/10/2016  . Chronic back pain 07/28/2016  . Diabetes mellitus type 2, noninsulin dependent ([place])   . CRD (chronic renal disease), stage III 04/12/2016  . Acute on chronic  renal insufficiency 04/12/2016  . Chronic anticoagulation-Coumadin 04/12/2016  . Hypertensive heart disease 03/28/2016  . Anemia, iron deficiency 10/22/2015  . Diabetic peripheral neuropathy ([place]) 03/09/2015  . Chronic diastolic CHF (congestive heart failure) ([place]) 09/07/2014  . Sick sinus syndrome ([place]) 03/09/2014  . Hypertension 06/21/2013  . Osteopenia   . Tachy-brady syndrome ([place])   . Atrial fibrillation ([place])   . Pulmonary HTN   . History of pulmonary embolism   . Carotid artery disease ([place])   . History of breast cancer   . Ejection fraction-55-60% by echo Jan 2017   . Pacemaker-Medtronic   . Syncope   . Mitral regurgitation   . Mediastinal lymphadenopathy   . THYROID NODULE, HX OF 07/19/2009  . Diabetes mellitus ([place]) 06/13/2008    Past Surgical History:  Procedure  Laterality Date  . ABDOMINAL HYSTERECTOMY    . ANKLE FRACTURE SURGERY Right   . BACK SURGERY    . BREAST LUMPECTOMY Left   . FRACTURE SURGERY    . INSERT / REPLACE / REMOVE PACEMAKER  06/30/2009   MDT Adalpta L implanted by Dr Olevia Perches  . LUMBAR LAMINECTOMY  1973, 06/22/2011   "no hardware either time"    OB History    No data available       Home Medications    Prior to Admission medications   Medication Sig Start Date End Date Taking? Authorizing Provider  amiodarone (PACERONE) 200 MG tablet Take 1 tablet (200 mg total) by mouth daily. 11/20/16  Yes Thompson Grayer, MD  furosemide (LASIX) 80 MG tablet Take 0.5 tablets (40 mg total) by mouth 2 (two) times daily. Patient taking differently: Take 80 mg by mouth 2 (two) times daily.  04/16/16  Yes Florencia Reasons, MD  gabapentin (NEURONTIN) 300 MG capsule Take 600 mg by mouth 2 (two) times daily.    Yes Historical Provider, MD  HYDROcodone-acetaminophen (NORCO/VICODIN) 5-325 MG tablet Take 0.5-1 tablets by mouth 2 (two) times daily as needed for moderate pain. 10/10/16  Yes Hoyt Koch, MD  metFORMIN (GLUCOPHAGE) 500 MG tablet Take 500 mg by mouth daily with breakfast.    Yes Historical Provider, MD  metoprolol succinate (TOPROL-XL) 25 MG 24 hr tablet Take 1 tablet (25 mg total) by mouth daily. 12/25/16  Yes Thompson Grayer, MD  potassium chloride SA (K-DUR,KLOR-CON) 20 MEQ tablet Take 20 mEq by mouth daily.   Yes Historical Provider, MD  promethazine (PHENERGAN) 25 MG tablet Take 1 tablet (25 mg total) by mouth every 8 (eight) hours as needed for nausea or vomiting. 10/22/16  Yes Hoyt Koch, MD  pyridoxine (B-6) 200 MG tablet Take 200 mg by mouth daily.   Yes Historical Provider, MD  vitamin B-12 (CYANOCOBALAMIN) 500 MCG tablet Take 500 mcg by mouth daily.    Yes Historical Provider, MD  warfarin (COUMADIN) 5 MG tablet TAKE 1/2 TABLET EVERY DAY EXCEPT 1 TABLET ON THURSDAY Patient taking differently: Take 2.5 mg by mouth daily 12/02/16   Yes Dorothy Spark, MD  ALPRAZolam Duanne Moron) 0.25 MG tablet Take 1 tablet (0.25 mg total) by mouth 2 (two) times daily as needed for anxiety. 12/30/16   Hoyt Koch, MD  hydrocortisone 2.5 % cream Apply 1 application topically as needed (for itching).    Historical Provider, MD  memantine [facility] TITRATION PAK) tablet pack 5 mg/day for =1 week; 5 mg twice daily for =1 week; 15 mg/day given in 5 mg and 10 mg separated doses for =1 week; then 10 mg twice  daily Patient not taking: Reported on 12/28/2016 11/14/16   Kathrynn Ducking, MD    Family History Family History  Problem Relation Age of Onset  . Heart disease Mother   . Hypertension Mother   . COPD Father   . Hypertension Father   . Hypertension Sister   . Hypertension Brother   . Hypertension Daughter   . Colon cancer Neg Hx   . Heart attack Neg Hx   . Stroke Neg Hx     Social History Social History  Substance Use Topics  . Smoking status: Former Smoker    Packs/day: 1.00    Years: 47.00    Types: Cigarettes    Quit date: 10/26/1998  . Smokeless tobacco: Never Used  . Alcohol use Yes     Comment: 10/14/2016 "I'll have a beer q once in awhile"     Allergies   Flecainide; Morphine and related; and Cephalexin   Review of Systems Review of Systems  All other systems reviewed and are negative.    Physical Exam Updated Vital Signs BP (!) 156/70   Pulse (!) 58   Temp 97.5 F (36.4 C) (Oral)   Resp 16   SpO2 100%   Physical Exam  Constitutional: She is oriented to person, place, and time. She appears well-developed and well-nourished. No distress.  HENT:  Head: Normocephalic and atraumatic.  Eyes: EOM are normal.  Neck: Normal range of motion.  Cardiovascular: Normal rate, regular rhythm and normal heart sounds.   Pulmonary/Chest: Effort normal and breath sounds normal.  Abdominal: Soft. She exhibits no distension. There is no tenderness.  Musculoskeletal: Normal range of motion. She exhibits no edema.    Neurological: She is alert and oriented to person, place, and time.  Skin: Skin is warm and dry.  Psychiatric: She has a normal mood and affect. Judgment normal.  Nursing note and vitals reviewed.    ED Treatments / Results  Labs (all labs ordered are listed, but only abnormal results are displayed) Labs Reviewed  CBC - Abnormal; Notable for the following:       Result Value   WBC 3.7 (*)    Hemoglobin 10.0 (*)    HCT 32.9 (*)    MCH 25.8 (*)    RDW 15.8 (*)    All other components within normal limits  BASIC METABOLIC PANEL - Abnormal; Notable for the following:    Glucose, Bld 146 (*)    BUN 24 (*)    Creatinine, Ser 1.67 (*)    GFR calc non Af Amer 27 (*)    GFR calc Af Amer 31 (*)    All other components within normal limits  PROTIME-INR - Abnormal; Notable for the following:    Prothrombin Time 23.1 (*)    All other components within normal limits  TROPONIN I    EKG  EKG Interpretation  Date/Time:  Saturday December 28 2016 12:21:08 EDT Ventricular Rate:  60 PR Interval:    QRS Duration: 94 QT Interval:  448 QTC Calculation: 448 R Axis:   58 Text Interpretation:  Sinus rhythm Probable left atrial enlargement No significant change was found Confirmed by Nakyah Erdmann  MD, Lennette Bihari ([id number]) on 12/28/2016 12:28:59 PM       Radiology Please see the electronic medical record for chest x-ray results  Procedures Procedures (including critical care time)  Medications Ordered in ED Medications - No data to display   Initial Impression / Assessment and Plan / ED Course  I have reviewed  the triage vital signs and the nursing notes.  Pertinent labs & imaging results that were available during my care of the patient were reviewed by me and considered in my medical decision making (see chart for details).     Patient is overall well-appearing.  She is asymptomatic at this time.  Her workup in emergency department is without significant abnormality.  I do not think she needs  additional testing or admission the hospital this time as her shortness of breath was transient and now completely resolved.  Primary care follow-up.  She understands to return to the ER for new or worsening symptoms  Final Clinical Impressions(s) / ED Diagnoses   Final diagnoses:  SOB (shortness of breath)    New Prescriptions Discharge Medication List as of 12/28/2016  3:31 PM       Jola Schmidt, MD 12/31/16 1038

## 2016-12-31 NOTE — Patient Instructions (Addendum)
Medication Instructions:  1. INCREASE LASIX FOR 2 DAYS TO 80 MG IN THE MORNING AND 40 MG IN THE PM; AFTER THE 2 DAYS RESUME LASIX 40 MG TWICE DAILY  Labwork: 1. TODAY BMET, CBC, TSH, FREE T4, UA  Testing/Procedures: NONE ORDERED  Follow-Up: KEEP YOUR FOLLOW UP WITH RENEE URSUY, PAC AS PLANNED 01/10/17 @ 3 PM  Any Other Special Instructions Will Be Listed Below (If Applicable). 1. WEIGH DAILY AND CALL IF WEIGHT IS INCREASED BY 3 LB'S IN 1 DAY OR 5 LB'S IN 1 WEEK  2. PER LAURA INGOLD, NP SHE HAS ADVISED FOR YOU TO CONTACT PRIMARY CARE TO GET STOOL CARDS AND FOLLOW THE DIRECTIONS ON THE PACKAGE; THIS IS DUE TO YOUR Hgb IS LOW    If you need a refill on your cardiac medications before your next appointment, please call your pharmacy.

## 2016-12-31 NOTE — Progress Notes (Signed)
Cardiology Office Note   Date:  12/31/2016   ID:  [name] [name], DOB 23-Jan-1931, MRN [id number]  PCP:  Hoyt Koch, MD  Cardiologist:  Dr. Rayann Heman    Chief Complaint  Patient presents with  . Shortness of Breath    post ER      History of Present Illness: [name] [name] is a 83 y.o. female who presents for SOB with any exertion.  Seen in ER 12/28/16 - there  CXR with Stable cardiomegaly, changes of COPD and chronic bronchitis, aortic atherosclerosis and large hiatal hernia.  EKG a pacing H/H 10/32.9  Cr 1.67 elevated from 1.44, BUN 24  K+ 3.8  Troponin <0.03  INR 2.01  Pt was having more a fib and toprol increased on the 28th. Dr. Rayann Heman concerned about rate control.  Pt now on amiodarone as well. When they interrogated her device in ER she had had a fib. But EKG with A pacing.     Her DOE is episodic --she does have lower ext edema.  No chest pain.  Her daughter is concerned about the amiodarone.     Hx of PPM, SSS, diastolic HF, carotid stenosis, DVT/PE, HTN, DM-2. Hx of neg nuc studies.   Past Medical History:  Diagnosis Date  . Acute gastric ulcer without mention of hemorrhage, perforation, or obstruction 2006   EGD  . Anxiety   . Atrial fibrillation (HCC)    flecainide; coumadin  . Breast cancer ([place])    left  . Carotid artery disease ([place])    57/32: RICA 2-02%, LICA 54-27%  //  Carotiud [place] 06/23: RICA 7-62%; LICA 83-15% >> FU 1 year   . Chronic diastolic heart failure (HCC)    a. echo 10/10: mild LVH, EF 55-60%, mild AI, mild MR, mild to mod LAE, mild RVE, severe RAE, mod TR, PASP 53  /  b.  Echo 3/14:  Mild LVH, EF 55%, Tr AI, MAC, mild MR, mod LAE, PASP 31, trivial eff. // c. echo 1/17: EF 55-60%, mild AI, MAC, mild MR, moderate BAE, moderate TR  . Chronic lower back pain   . Diabetic peripheral neuropathy ([place]) 03/09/2015  . Diverticulosis   . Esophagitis, unspecified 2006   EGD  . Hiatal hernia 2006   EGD  . History of nuclear stress test    a. Myoview 2/12: EF 69%, no scar or ischemia  . Hx of adenomatous colonic polyps   . Hypertension   . Mediastinal lymphadenopathy   . Memory difficulty 11/14/2016  . Mitral regurgitation    mild, echo, October, 2010  . Osteopenia   . Presence of permanent cardiac pacemaker   . Pulmonary embolus [facility]) 2006   2006, with DVT  . Pulmonary HTN    53 mmHg echo, 2010, moderate TR, mild right ventricular enlargement  . Tachy-brady syndrome ([employer])    s/p pacer 07/2009  . Thyroid nodule    non-neoplastic goiter  . Type II diabetes mellitus ([place])   . Venous insufficiency    Chronic    Past Surgical History:  Procedure Laterality Date  . ABDOMINAL HYSTERECTOMY    . ANKLE FRACTURE SURGERY Right   . BACK SURGERY    . BREAST LUMPECTOMY Left   . FRACTURE SURGERY    . INSERT / REPLACE / REMOVE PACEMAKER  06/30/2009   MDT Adalpta L implanted by Dr Olevia Perches  . LUMBAR LAMINECTOMY  1973, 06/22/2011   "no hardware either time"     Current Outpatient Prescriptions  Medication  Sig Dispense Refill  . ALPRAZolam (XANAX) 0.25 MG tablet Take 1 tablet (0.25 mg total) by mouth 2 (two) times daily as needed for anxiety. 20 tablet 0  . amiodarone (PACERONE) 200 MG tablet Take 1 tablet (200 mg total) by mouth daily. 30 tablet 9  . furosemide (LASIX) 80 MG tablet Take 0.5 tablets (40 mg total) by mouth 2 (two) times daily. 180 tablet 3  . gabapentin (NEURONTIN) 300 MG capsule Take 600 mg by mouth 2 (two) times daily.     Marland Kitchen HYDROcodone-acetaminophen (NORCO/VICODIN) 5-325 MG tablet Take 0.5-1 tablets by mouth 2 (two) times daily as needed for moderate pain. 30 tablet 0  . hydrocortisone 2.5 % cream Apply 1 application topically as needed (for itching).    . metFORMIN (GLUCOPHAGE) 500 MG tablet Take 500 mg by mouth daily with breakfast.     . metoprolol succinate (TOPROL-XL) 25 MG 24 hr tablet Take 1 tablet (25 mg total) by mouth daily. 90 tablet 3  . potassium chloride SA (K-DUR,KLOR-CON) 20 MEQ tablet Take 20  mEq by mouth daily.    . promethazine (PHENERGAN) 25 MG tablet Take 1 tablet (25 mg total) by mouth every 8 (eight) hours as needed for nausea or vomiting. 20 tablet 0  . pyridoxine (B-6) 200 MG tablet Take 200 mg by mouth daily.    . vitamin B-12 (CYANOCOBALAMIN) 500 MCG tablet Take 500 mcg by mouth daily.     Marland Kitchen warfarin (COUMADIN) 5 MG tablet TAKE 1/2 TABLET EVERY DAY EXCEPT 1 TABLET ON THURSDAY 30 tablet 3   No current facility-administered medications for this visit.     Allergies:   Flecainide; Morphine and related; and Cephalexin    Social History:  The patient  reports that she quit smoking about 18 years ago. Her smoking use included Cigarettes. She has a 47.00 pack-year smoking history. She has never used smokeless tobacco. She reports that she drinks alcohol. She reports that she does not use drugs.   Family History:  The patient's family history includes COPD in her father; Heart disease in her mother; Hypertension in her brother, daughter, father, mother, and sister.    ROS:  General:no colds or fevers, no weight changes Skin:no rashes or ulcers HEENT:no blurred vision, no congestion CV:see HPI PUL:see HPI GI:no diarrhea constipation or melena?  She stated her stools were black, no indigestion GU:no hematuria, no dysuria- but SOB with UTI MS:no joint pain, no claudication Neuro:no syncope, no lightheadedness Endo:+ diabetes, no thyroid disease  Wt Readings from Last 3 Encounters:  12/31/16 234 lb (106.1 kg)  12/25/16 236 lb 8 oz (107.3 kg)  11/14/16 220 lb (99.8 kg)     PHYSICAL EXAM: VS:  BP 126/62   Pulse 60   Ht 6' (1.829 m)   Wt 234 lb (106.1 kg)   BMI 31.74 kg/m  , BMI Body mass index is 31.74 kg/m. General:Pleasant affect, NAD Skin:Warm and dry, brisk capillary refill HEENT:normocephalic, sclera clear, mucus membranes moist Neck:supple, no JVD, no bruits  Heart:S1S2 RRR without murmur, gallup, rub or click Lungs:clear but diminished in bases without  rales, rhonchi, or wheezes AST:MHDQ, non tender, + BS, do not palpate liver spleen or masses Ext:1+ lower ext edema, 2+ pedal pulses, 2+ radial pulses Neuro:alert and oriented X 3, MAE, follows commands, + facial symmetry    EKG:  EKG is NOT ordered today.    Recent Labs: 04/13/2016: TSH 1.503 10/14/2016: B Natriuretic Peptide 160.1 11/10/2016: ALT 14; Magnesium 1.9 12/28/2016: BUN 24;  Creatinine, Ser 1.67; Hemoglobin 10.0; Platelets 284; Potassium 3.8; Sodium 138    Lipid Panel No results found for: CHOL, TRIG, HDL, CHOLHDL, VLDL, LDLCALC, LDLDIRECT     Other studies Reviewed: Additional studies/ records that were reviewed today include: Marland Kitchen [facility]    Nuclear stress EF: 69%.  There was no ST segment deviation noted during stress.  Defect 1: There is a small defect of mild severity present in the basal inferolateral and mid inferolateral location. This defect is partially reversible and could represent a very small area of ischemia. Clinical correlation recommended.  This is a low risk study.  The left ventricular ejection fraction is hyperdynamic (>65%).      ECHO Study Conclusions  - Left ventricle: The cavity size was normal. There was mild   concentric hypertrophy. Systolic function was normal. The   estimated ejection fraction was in the range of 55% to 60%. Wall   motion was normal; there were no regional wall motion   abnormalities. Doppler parameters are consistent with restrictive   physiology, indicative of decreased left ventricular diastolic   compliance and/or increased left atrial pressure. Doppler   parameters are consistent with elevated ventricular end-diastolic   filling pressure. - Aortic valve: There was mild regurgitation. - Aortic root: The aortic root was normal in size. - Ascending aorta: The ascending aorta was normal in size. - Mitral valve: Calcified annulus. Mildly thickened leaflets .   There was mild  regurgitation. - Left atrium: The atrium was moderately dilated. - Right ventricle: Pacer wire or catheter noted in right ventricle. - Right atrium: Pacer wire or catheter noted in right atrium. - Tricuspid valve: There was moderate regurgitation. - Pulmonic valve: There was no regurgitation. - Pulmonary arteries: Systolic pressure was mildly increased. PA   peak pressure: 38 mm Hg (S). - Inferior vena cava: The vessel was normal in size. - Pericardium, extracardiac: There was no pericardial effusion.  ASSESSMENT AND PLAN:  1.  Acute on chronic diastolic HF.  Increase lasix to 80 in am and 40 in pm. For 2 days - she will weigh daily and if improved she will see Dr. Rayann Heman on the 13, if still SOB will call.  May need increased dose of lasix prn.  Her a fib most likely increases the SOB.  May need PFTs with difusing capacities in future  Will check BMET CBC TSH and Free T4,    2. SOB her daughter states with UTI in past pt has been SOB will check u/a  3. Anemia Hgb in ER 10 - down from 12 a month prior she states her stools are black, she states from eating spinach.  Will repeat cbc and have her do hemoccult cards through PCP   4. PAF still with episodes, on amiodarone to see EP next week.    5. On anticoagulation with coumadin. Decrease in Hgb. Recheck.     Current medicines are reviewed with the patient today.  The patient Has no concerns regarding medicines.  The following changes have been made:  See above Labs/ tests ordered today include:see above  Disposition:   FU:  see above  Signed, Cecilie Kicks, NP  12/31/2016 5:30 PM    [address], [place], [place], [place] Phone: [phone]; Fax: [phone]

## 2017-01-01 ENCOUNTER — Telehealth: Payer: Self-pay | Admitting: Internal Medicine

## 2017-01-01 DIAGNOSIS — Z79899 Other long term (current) drug therapy: Secondary | ICD-10-CM

## 2017-01-01 LAB — CBC
HEMATOCRIT: 32.6 % — AB (ref 34.0–46.6)
HEMOGLOBIN: 10.8 g/dL — AB (ref 11.1–15.9)
MCH: 26.6 pg (ref 26.6–33.0)
MCHC: 33.1 g/dL (ref 31.5–35.7)
MCV: 80 fL (ref 79–97)
Platelets: 358 10*3/uL (ref 150–379)
RBC: 4.06 x10E6/uL (ref 3.77–5.28)
RDW: 16.9 % — ABNORMAL HIGH (ref 12.3–15.4)
WBC: 4.5 10*3/uL (ref 3.4–10.8)

## 2017-01-01 LAB — URINALYSIS

## 2017-01-01 LAB — BASIC METABOLIC PANEL
BUN/Creatinine Ratio: 18 (ref 12–28)
BUN: 29 mg/dL — ABNORMAL HIGH (ref 8–27)
CO2: 29 mmol/L (ref 18–29)
CREATININE: 1.63 mg/dL — AB (ref 0.57–1.00)
Calcium: 9.7 mg/dL (ref 8.7–10.3)
Chloride: 98 mmol/L (ref 96–106)
GFR calc Af Amer: 33 mL/min/{1.73_m2} — ABNORMAL LOW (ref >59)
GFR, EST NON AFRICAN AMERICAN: 29 mL/min/{1.73_m2} — AB (ref >59)
GLUCOSE: 111 mg/dL — AB (ref 65–99)
Potassium: 4.1 mmol/L (ref 3.5–5.2)
SODIUM: 142 mmol/L (ref 134–144)

## 2017-01-01 LAB — TSH: TSH: 1.58 u[IU]/mL (ref 0.450–4.500)

## 2017-01-01 LAB — T4, FREE: FREE T4: 1.33 ng/dL (ref 0.82–1.77)

## 2017-01-01 NOTE — Telephone Encounter (Signed)
Pt's daughter [name] called this morning. Pt was seen yesterday by Cecilie Kicks NP with C/O of SOB. According to pt's daughter pt has been taking Lasix 80 mg two times daily. According to the home ED medication list  On 12/28/16  pt was taking 80 mg twice a day different from the dose written of 40 mg BID. Daughter states that she didn't realized that pt was taking the whole tablet (80 mg) all this time and still she is having symptoms of SOB. Daughter wants to know if pt  needs to continue taking the 80 mg twice a day even that NP recommended to take 80 mg in am and 40 mg in the evening for 2 days only, then to go back to 40 mg BID.

## 2017-01-01 NOTE — Telephone Encounter (Signed)
Ruzie( Daughter) is calling to about the Furosemide , because the dosage was change , but the prescription was written for the same as she was taking it . She is needing some clarification . Please call

## 2017-01-02 MED ORDER — FUROSEMIDE 80 MG PO TABS: 80.0000 mg | ORAL_TABLET | Freq: Two times a day (BID) | ORAL | 3 refills | Status: DC

## 2017-01-02 MED ORDER — METOLAZONE 2.5 MG PO TABS: ORAL_TABLET | ORAL | 0 refills | Status: DC

## 2017-01-02 NOTE — Telephone Encounter (Signed)
Follow up    Pt is returning call to [place] about mother.

## 2017-01-02 NOTE — Telephone Encounter (Signed)
[name] [name], pt daughter, DPR on file, returned my call.  She has been advised to keep pt on Lasix 80 bid and to add Metolazone 2.5 mg 30 mins before taking the Lasix for 3 days only.  She will bring pt back in 01/06/17 for repeat BMET.  She verbalized understanding and appreciation.

## 2017-01-02 NOTE — Telephone Encounter (Signed)
Let's add metolazone 2.5 mg daily before the lasix for 3 days, then please have her have BMP on Monday.  Her Hgb was stable.

## 2017-01-02 NOTE — Addendum Note (Signed)
Addended by: Briant Cedar on: 01/02/2017 08:17 AM   Modules accepted: Orders

## 2017-01-06 ENCOUNTER — Telehealth: Payer: Self-pay | Admitting: *Deleted

## 2017-01-06 ENCOUNTER — Other Ambulatory Visit: Payer: Medicare HMO | Admitting: *Deleted

## 2017-01-06 DIAGNOSIS — Z79899 Other long term (current) drug therapy: Secondary | ICD-10-CM

## 2017-01-06 DIAGNOSIS — N189 Chronic kidney disease, unspecified: Secondary | ICD-10-CM | POA: Diagnosis not present

## 2017-01-06 DIAGNOSIS — N289 Disorder of kidney and ureter, unspecified: Secondary | ICD-10-CM | POA: Diagnosis not present

## 2017-01-06 DIAGNOSIS — N183 Chronic kidney disease, stage 3 unspecified: Secondary | ICD-10-CM

## 2017-01-06 LAB — BASIC METABOLIC PANEL
BUN / CREAT RATIO: 17 (ref 12–28)
BUN: 32 mg/dL — ABNORMAL HIGH (ref 8–27)
CALCIUM: 9.6 mg/dL (ref 8.7–10.3)
CHLORIDE: 95 mmol/L — AB (ref 96–106)
CO2: 27 mmol/L (ref 18–29)
Creatinine, Ser: 1.86 mg/dL — ABNORMAL HIGH (ref 0.57–1.00)
GFR, EST AFRICAN AMERICAN: 28 mL/min/{1.73_m2} — AB (ref >59)
GFR, EST NON AFRICAN AMERICAN: 24 mL/min/{1.73_m2} — AB (ref >59)
Glucose: 156 mg/dL — ABNORMAL HIGH (ref 65–99)
POTASSIUM: 4.4 mmol/L (ref 3.5–5.2)
SODIUM: 139 mmol/L (ref 134–144)

## 2017-01-06 NOTE — Telephone Encounter (Signed)
-----   Message from Isaiah Serge, NP sent at 01/06/2017  4:05 PM EDT ----- Has the metolazone helped at all?  Labs stable but hold metolazone for now she is seen Friday in the office.

## 2017-01-06 NOTE — Progress Notes (Signed)
urn

## 2017-01-07 LAB — URINALYSIS
BILIRUBIN UA: NEGATIVE
GLUCOSE, UA: NEGATIVE
Ketones, UA: NEGATIVE
Leukocytes, UA: NEGATIVE
Nitrite, UA: NEGATIVE
PH UA: 7 (ref 5.0–7.5)
PROTEIN UA: NEGATIVE
RBC UA: NEGATIVE
SPEC GRAV UA: 1.011 (ref 1.005–1.030)
UUROB: 0.2 mg/dL (ref 0.2–1.0)

## 2017-01-09 ENCOUNTER — Encounter: Payer: Medicare HMO | Admitting: Physician Assistant

## 2017-01-10 ENCOUNTER — Ambulatory Visit (INDEPENDENT_AMBULATORY_CARE_PROVIDER_SITE_OTHER): Payer: Medicare HMO | Admitting: *Deleted

## 2017-01-10 ENCOUNTER — Ambulatory Visit (INDEPENDENT_AMBULATORY_CARE_PROVIDER_SITE_OTHER): Payer: Medicare HMO | Admitting: Physician Assistant

## 2017-01-10 VITALS — BP 132/70 | HR 63 | Ht 72.0 in | Wt 233.0 lb

## 2017-01-10 DIAGNOSIS — Z79899 Other long term (current) drug therapy: Secondary | ICD-10-CM

## 2017-01-10 DIAGNOSIS — I4891 Unspecified atrial fibrillation: Secondary | ICD-10-CM

## 2017-01-10 DIAGNOSIS — I5043 Acute on chronic combined systolic (congestive) and diastolic (congestive) heart failure: Secondary | ICD-10-CM

## 2017-01-10 DIAGNOSIS — Z86711 Personal history of pulmonary embolism: Secondary | ICD-10-CM

## 2017-01-10 DIAGNOSIS — Z950 Presence of cardiac pacemaker: Secondary | ICD-10-CM | POA: Diagnosis not present

## 2017-01-10 DIAGNOSIS — I482 Chronic atrial fibrillation, unspecified: Secondary | ICD-10-CM

## 2017-01-10 DIAGNOSIS — I480 Paroxysmal atrial fibrillation: Secondary | ICD-10-CM

## 2017-01-10 DIAGNOSIS — I110 Hypertensive heart disease with heart failure: Secondary | ICD-10-CM | POA: Diagnosis not present

## 2017-01-10 LAB — POCT INR: INR: 1.5

## 2017-01-10 NOTE — Patient Instructions (Addendum)
Medication Instructions:   DO NOT TAKE LASIX TONIGHT 4*13*18  RESUME TOMORROW  NORMAL LASIX  DOSE 80 MG TWICE A DAY   If you need a refill on your cardiac medications before your next appointment, please call your pharmacy.  Labwork:   BMET  TSH LFT AND CBC TODAY    Testing/Procedures: NONE ORDERED  TODAY    Follow-Up: IN 2 WEEKS WITH RENEE URSUY   Any Other Special Instructions Will Be Listed Below (If Applicable).

## 2017-01-10 NOTE — Progress Notes (Signed)
Cardiology Office Note Date:  01/10/2017  Patient ID:  [name] [name], [name] [name] [birth year], MRN [id number] PCP:  Hoyt Koch, MD  Cardiologist:  Dr. Rayann Heman   Chief Complaint: f/u on response to meds, PM check  History of Present Illness: [name] [name] is a 83 y.o. female with history of SSSx w/PPM, anxiety, AFib, chronic CHF (diastolic), CBP, DM, remote hx of DVT, HTN, chronic venous insufficiency comes in today to be seen for Dr. Rayann Heman, last seen by him 12/25/16, at that time despite amiodarone was still having a fair amount of AF, her BB was up-titrated and suspected that rate control strategies may be the ultimate POC, and not felt to be an ablation candidate.    Most recently was seen by L.Dorene Ar, NP for ER f/u visit with c/o SOB, CXR with Stable cardiomegaly, changes of COPD and chronic bronchitis, aortic atherosclerosis and large hiatal hernia, she was found with fluid OL and recommended temp increase in her diuresis, suspected her PAF was contributing as well to her acute/chronic CHF.  01/06/17 K+ 4.4 BUN/Creat 32/1.86 12/31/16  BUN/Creat 29/1.63 H/H 10.8/32.6 (stable) TSH 1.580  The patient comes today accompanied by her daughter.  In regards to her swelling, this has completely resolved.  Her weight is on;y down one pound but they report her initially as having sever swelling, and now back to baseline whic is none.  She fell unfortunately, 01/04/17.  She was in the BR, urinated and upon standing feels like her legs just gave out.  She is certain she did not balck out or faint, she recalls the event, did not hit her head or have an injury but was to weak to to get up.  She finally was able to pull a phone over and call her daughter who called 911.  The patient's son arrived 29st and by the time 57 arrived he had helped her up and to a chair, they report EMS found her vitals OK and given this and feeling OK she elected not to be transported.  She expresses concern over recently not  being able to get to the BR in time and wetting herself.  In general since her hospital stay in January she doesn't feel like she has gotten back to her self.  She states particularly of late just feeling ", reports when she gets up from the toilet or up standing needs to hang on and collect herself before she can move about.  She has not had CP, she has not felt palpitations, and she has not had syncope.  She reports a clear up-tick in urinary frequency and amount. She expressed poor exertional tolerance, gets winded.   Device information: MDT dual chamber PPM, implanted 06/30/09, SSSx, Dr. Rayann Heman AF hx: AAD amiodarone, started Jan 2018 w/episode rapid AFlutter Hx of Flecainide d/c 2/2 CHF and marked PR prolongation to >440m  Past Medical History:  Diagnosis Date  . Acute gastric ulcer without mention of hemorrhage, perforation, or obstruction 2006   EGD  . Anxiety   . Atrial fibrillation (HCC)    flecainide; coumadin  . [place] cancer ([place]    [place]  . [facility] artery [facility] ([facility]    154/00 RICA 08-67% LICA 461-95% //  [place] RICA 16-71% LICA 624-58%>> FU 1 year   . Chronic diastolic heart failure (HCC)    a. echo 10/10: mild LVH, EF 55-60%, mild AI, mild MR, mild to mod LAE, mild RVE, severe RAE, mod TR, PASP 53  /  b.  Echo 3/14:  Mild LVH, EF 55%, Tr AI, MAC, mild MR, mod LAE, PASP 31, trivial eff. // c. echo 1/17: EF 55-60%, mild AI, MAC, mild MR, moderate BAE, moderate TR  . Chronic lower back pain   . Diabetic peripheral neuropathy ([place]) 03/09/2015  . Diverticulosis   . Esophagitis, unspecified 2006   EGD  . Hiatal hernia 2006   EGD  . History of nuclear stress test    a. Myoview 2/12: EF 69%, no scar or ischemia  . Hx of adenomatous colonic polyps   . Hypertension   . Mediastinal lymphadenopathy   . Memory difficulty 11/14/2016  . Mitral regurgitation    mild, echo, October, 2010  . Osteopenia   . Presence of permanent cardiac pacemaker   . Pulmonary embolus  [facility] 2006   2006, with DVT  . Pulmonary HTN    53 mmHg echo, 2010, moderate TR, mild right ventricular enlargement  . Tachy-brady syndrome ([place])    s/p pacer 07/2009  . Thyroid nodule    non-neoplastic goiter  . Type II diabetes mellitus ([place])   . Venous insufficiency    Chronic    Past Surgical History:  Procedure Laterality Date  . ABDOMINAL HYSTERECTOMY    . ANKLE FRACTURE SURGERY Right   . BACK SURGERY    . BREAST LUMPECTOMY Left   . FRACTURE SURGERY    . INSERT / REPLACE / REMOVE PACEMAKER  06/30/2009   MDT Adalpta L implanted by Dr Olevia Perches  . LUMBAR LAMINECTOMY  1973, 06/22/2011   "no hardware either time"    Current Outpatient Prescriptions  Medication Sig Dispense Refill  . ALPRAZolam (XANAX) 0.25 MG tablet Take 1 tablet (0.25 mg total) by mouth 2 (two) times daily as needed for anxiety. 20 tablet 0  . amiodarone (PACERONE) 200 MG tablet Take 1 tablet (200 mg total) by mouth daily. 30 tablet 9  . furosemide (LASIX) 80 MG tablet Take 1 tablet (80 mg total) by mouth 2 (two) times daily. 180 tablet 3  . gabapentin (NEURONTIN) 300 MG capsule Take 600 mg by mouth 2 (two) times daily.     Marland Kitchen HYDROcodone-acetaminophen (NORCO/VICODIN) 5-325 MG tablet Take 0.5-1 tablets by mouth 2 (two) times daily as needed for moderate pain. 30 tablet 0  . hydrocortisone 2.5 % cream Apply 1 application topically as needed (for itching).    . metFORMIN (GLUCOPHAGE) 500 MG tablet Take 500 mg by mouth daily with breakfast.     . metoprolol succinate (TOPROL-XL) 25 MG 24 hr tablet Take 1 tablet (25 mg total) by mouth daily. 90 tablet 3  . potassium chloride SA (K-DUR,KLOR-CON) 20 MEQ tablet Take 20 mEq by mouth daily.    . promethazine (PHENERGAN) 25 MG tablet Take 1 tablet (25 mg total) by mouth every 8 (eight) hours as needed for nausea or vomiting. 20 tablet 0  . pyridoxine (B-6) 200 MG tablet Take 200 mg by mouth daily.    . vitamin B-12 (CYANOCOBALAMIN) 500 MCG tablet Take 500 mcg by mouth  daily.     Marland Kitchen warfarin (COUMADIN) 5 MG tablet TAKE 1/2 TABLET EVERY DAY EXCEPT 1 TABLET ON THURSDAY 30 tablet 3   No current facility-administered medications for this visit.     Allergies:   Flecainide; Morphine and related; and Cephalexin   Social History:  The patient  reports that she quit smoking about 18 years ago. Her smoking use included Cigarettes. She has a 47.00 pack-year smoking history. She has never  used smokeless tobacco. She reports that she drinks alcohol. She reports that she does not use drugs.   Family History:  The patient's family history includes COPD in her father; Heart disease in her mother; Hypertension in her brother, daughter, father, mother, and sister.   ROS:  Please see the history of present illness.  All other systems are reviewed and otherwise negative.   PHYSICAL EXAM:  VS:  BP 132/70   Pulse 63   Ht 6' (1.829 m)   Wt 233 lb (105.7 kg)   BMI 31.60 kg/m  BMI: Body mass index is 31.6 kg/m. Well nourished, well developed, in no acute distress  HEENT: normocephalic, atraumatic  Neck: no JVD, carotid bruits or masses Cardiac:  RRR; no significant murmurs, no rubs, or gallops Lungs:  CTA b/l, no wheezing, rhonchi or rales  Abd: soft, nontender MS: no deformity or atrophy Ext: no edema, chronic looking skin changes, small spider viens varicosities are noted b/l, she has trace posterior ankle edema if any Skin: warm and dry, no rash Neuro:  No gross deficits appreciated Psych: euthymic mood, full affect  PPM site is stable, no tethering or discomfort   EKG:  Done 12/31/16: Apaced, V sensed PPM interrogation done by industry today and reviewed by myself: battery and lead measurements are stable/OK, she is 94.7% AP/VS, she has had AMS episodes longest 3hours34mnutes, and not rate controlled with VR rates 130-150 range  06[facility]17/16[facility]   Nuclear stress EF: 69%.  There was no ST segment deviation noted during  stress.  Defect 1: There is a small defect of mild severity present in the basal inferolateral and mid inferolateral location. This defect is partially reversible and could represent a very small area of ischemia. Clinical correlation recommended.  This is a low risk study.  The left ventricular ejection fraction is hyperdynamic (>65%).    04/13/16 ECHO Study Conclusions - Left ventricle: The cavity size was normal. There was mild concentric hypertrophy. Systolic function was normal. The estimated ejection fraction was in the range of 55% to 60%. Wall motion was normal; there were no regional wall motion abnormalities. Doppler parameters are consistent with restrictive physiology, indicative of decreased left ventricular diastolic compliance and/or increased left atrial pressure. Doppler parameters are consistent with elevated ventricular end-diastolic filling pressure. - Aortic valve: There was mild regurgitation. - Aortic root: The aortic root was normal in size. - Ascending aorta: The ascending aorta was normal in size. - Mitral valve: Calcified annulus. Mildly thickened leaflets . There was mild regurgitation. - Left atrium: The atrium was moderately dilated. - Right ventricle: Pacer wire or catheter noted in right ventricle. - Right atrium: Pacer wire or catheter noted in right atrium. - Tricuspid valve: There was moderate regurgitation. - Pulmonic valve: There was no regurgitation. - Pulmonary arteries: Systolic pressure was mildly increased. PA peak pressure: 38 mm Hg (S). - Inferior vena cava: The vessel was normal in size. - Pericardium, extracardiac: There was no pericardial effusion.    Recent Labs: 10/14/2016: B Natriuretic Peptide 160.1 11/10/2016: ALT 14; Magnesium 1.9 12/28/2016: Hemoglobin 10.0 12/31/2016: Platelets 358; TSH 1.580 01/06/2017: BUN 32; Creatinine, Ser 1.86; Potassium 4.4; Sodium 139  No results found for requested labs within  last 8760 hours.   Estimated Creatinine Clearance: 30.6 mL/min (A) (by C-G formula based on SCr of 1.86 mg/dL (H)).   Wt Readings from Last 3 Encounters:  01/10/17 233 lb (105.7 kg)  12/31/16 234 lb (106.1 kg)  12/25/16 236  lb 8 oz (107.3 kg)     Other studies reviewed: Additional studies/records reviewed today include: summarized above  ASSESSMENT AND PLAN:  1. CHF disatolic     Exam is compensated  As it turns out, she has been taking 28m of lasix BID all along for MONTHS (not 469mas written).  She responded very well to the course of demadex. I will get a BMET today and for now, keep her at the 8034mID of lasix. She is + for 14m44morthostatic change, and instructed to hold the lasix tonight.  She eats a significant amount of sodium especially hot dogs routinely as well as potato chips, we discussed this at length as well as daily weights       2. Paroxysmal AFib     CHA2Ds2Vasc is 5, on warfarin, monitored and managed with the coumadin clnic     Pacer notes + AMS but 94% AP/VS rhythm, her last couple EKGs have been sinus or A paced so will continue her amiodarone     Will check TSH/LFT's  She reports black stool Saturday (no BM since , this is her routine), will get a CBC  3. HTN     Mildly orthostatic today after demadex  4. PPM     Normal device function, rate response was programmed on in effort to give her better exertional tolerance  I inquired about her at home, she lives by herself but has good family support and her daughter takes care of her medicines  Disposition:   Will keep an eye on her, see her back in 2-3 weeks, sooner if needed.  Current medicines are reviewed at length with the patient today.  The patient did not have any concerns regarding medicines.  SignHaywood Lasso-C 01/10/2017 3:16 PM     [place]MGMountain [facility] ParktS[place] [phone])  [phone])

## 2017-01-11 LAB — CBC
Hematocrit: 32.1 % — ABNORMAL LOW (ref 34.0–46.6)
Hemoglobin: 10.4 g/dL — ABNORMAL LOW (ref 11.1–15.9)
MCH: 26 pg — AB (ref 26.6–33.0)
MCHC: 32.4 g/dL (ref 31.5–35.7)
MCV: 80 fL (ref 79–97)
PLATELETS: 367 10*3/uL (ref 150–379)
RBC: 4 x10E6/uL (ref 3.77–5.28)
RDW: 16.8 % — ABNORMAL HIGH (ref 12.3–15.4)
WBC: 5.4 10*3/uL (ref 3.4–10.8)

## 2017-01-11 LAB — BASIC METABOLIC PANEL
BUN / CREAT RATIO: 24 (ref 12–28)
BUN: 50 mg/dL — AB (ref 8–27)
CALCIUM: 9.5 mg/dL (ref 8.7–10.3)
CHLORIDE: 88 mmol/L — AB (ref 96–106)
CO2: 30 mmol/L — ABNORMAL HIGH (ref 18–29)
Creatinine, Ser: 2.09 mg/dL — ABNORMAL HIGH (ref 0.57–1.00)
GFR calc Af Amer: 25 mL/min/{1.73_m2} — ABNORMAL LOW (ref >59)
GFR calc non Af Amer: 21 mL/min/{1.73_m2} — ABNORMAL LOW (ref >59)
GLUCOSE: 85 mg/dL (ref 65–99)
Potassium: 4.2 mmol/L (ref 3.5–5.2)
Sodium: 135 mmol/L (ref 134–144)

## 2017-01-11 LAB — HEPATIC FUNCTION PANEL
ALT: 10 IU/L (ref 0–32)
AST: 18 IU/L (ref 0–40)
Albumin: 4.3 g/dL (ref 3.5–4.7)
Alkaline Phosphatase: 127 IU/L — ABNORMAL HIGH (ref 39–117)
BILIRUBIN TOTAL: 0.6 mg/dL (ref 0.0–1.2)
Bilirubin, Direct: 0.15 mg/dL (ref 0.00–0.40)
Total Protein: 8 g/dL (ref 6.0–8.5)

## 2017-01-11 LAB — TSH: TSH: 1.65 u[IU]/mL (ref 0.450–4.500)

## 2017-01-13 ENCOUNTER — Ambulatory Visit (INDEPENDENT_AMBULATORY_CARE_PROVIDER_SITE_OTHER): Payer: Medicare HMO | Admitting: Internal Medicine

## 2017-01-13 ENCOUNTER — Encounter: Payer: Self-pay | Admitting: Internal Medicine

## 2017-01-13 DIAGNOSIS — R296 Repeated falls: Secondary | ICD-10-CM

## 2017-01-13 DIAGNOSIS — I5032 Chronic diastolic (congestive) heart failure: Secondary | ICD-10-CM

## 2017-01-13 DIAGNOSIS — D509 Iron deficiency anemia, unspecified: Secondary | ICD-10-CM

## 2017-01-13 NOTE — Assessment & Plan Note (Signed)
She feels that she has had improvement since adjustment of pacemaker. Offered PT/OT in home today to help with safety and confidence but she declines due to no heat in her home. She wants to wait to see how she feels once this is done. With some counseling she does agree to stay with her daughter until her heat returns for safety and health reasons.

## 2017-01-13 NOTE — Progress Notes (Signed)
Pre visit review using our clinic review tool, if applicable. No additional management support is needed unless otherwise documented below in the visit note. 

## 2017-01-13 NOTE — Patient Instructions (Signed)
We have given you the cards to do and send back.

## 2017-01-13 NOTE — Progress Notes (Signed)
   Subjective:    Patient ID: [name] [name], female    DOB: [birth year], 83 y.o.   MRN: [id number]  HPI The patient is an 83 YO female coming in for ER follow up (in for SOB, followed up with cardiology and diuretics increased). She has since had resolution of the swelling she was having. Lasix back to normal dosing. She has had fall since that time as well. She was on the toilet and stood after urinating and legs gave out. She denies LOC or head injury. She did call 911 but after evaluation there was no injury and she did not go to ER. She is breathing better since changes to her pacemaker at cardiology. She denies SOB or chest pains. She denies constipation or diarrhea.   Review of Systems  Constitutional: Positive for activity change and fatigue. Negative for appetite change, chills, fever and unexpected weight change.  HENT: Negative.   Eyes: Negative.   Respiratory: Positive for shortness of breath. Negative for cough, chest tightness and wheezing.        Mild, improving  Cardiovascular: Negative for chest pain, palpitations and leg swelling.  Gastrointestinal: Negative for abdominal distention, abdominal pain, constipation, diarrhea and nausea.  Endocrine: Negative.   Musculoskeletal: Positive for arthralgias, back pain and gait problem. Negative for joint swelling, myalgias, neck pain and neck stiffness.  Skin: Negative.   Neurological: Positive for weakness. Negative for dizziness, facial asymmetry, light-headedness and headaches.      Objective:   Physical Exam  Constitutional: She is oriented to person, place, and time. She appears well-developed and well-nourished.  HENT:  Head: Normocephalic and atraumatic.  Eyes: EOM are normal.  Neck: Normal range of motion.  Cardiovascular: Normal rate.   Pulmonary/Chest: Effort normal. No respiratory distress. She has no wheezes. She has no rales.  Abdominal: Soft. She exhibits no distension. There is no tenderness. There is no  rebound.  Musculoskeletal: She exhibits tenderness. She exhibits no edema.  Neurological: She is alert and oriented to person, place, and time. Coordination abnormal.  Cane and some instability with ambulation  Skin: Skin is warm and dry.  Psychiatric: She has a normal mood and affect.   Vitals:   01/13/17 1520  BP: (!) 150/70  Pulse: 73  Resp: 14  SpO2: 97%  Weight: 235 lb (106.6 kg)  Height: 6' (1.829 m)      Assessment & Plan:

## 2017-01-13 NOTE — Assessment & Plan Note (Signed)
No flare today, is on 40 mg BID lasix due to slight change in renal function from cardiology and will go back there for renal monitoring. Still with high sodium diet and trying to monitor weights. She does not have weight gain recently or swelling. SOB is improved some with pacemaker adjustment.

## 2017-01-13 NOTE — Assessment & Plan Note (Signed)
1 episode of dark stools in the last 2 months. Given stool cards today to do and return to check for blood loss. Hg last stable with cardiology.

## 2017-01-14 ENCOUNTER — Telehealth: Payer: Self-pay | Admitting: *Deleted

## 2017-01-14 MED ORDER — FUROSEMIDE 80 MG PO TABS: 40.0000 mg | ORAL_TABLET | Freq: Two times a day (BID) | ORAL | 3 refills | Status: DC

## 2017-01-14 NOTE — Telephone Encounter (Signed)
-----   Message from [facility] [place] sent at 01/13/2017 11:53 AM EDT ----- Please let the patient know her blood counts are stable, continue her coumadin as directed by the coumadin clinic. Her kidney function is lower, likely because of the recent extra water pill.  Please have them reduce her lasix some to 40mg  BID (from 80mg ) and monitor her weight EVERY morning and let us know if she gains 3 pounds in a day or 5 in a week or notes swelling starting.  This is very important.  Please discuss with daughter who takes care of her medicines as well.  Repeat BMET 7-10days  Thanks [employer]

## 2017-01-14 NOTE — Telephone Encounter (Signed)
SPOKE TO PT ABOUT RESULTS AND MEDICINE CHANGES FROM  80 MG TO 40 MG TWICE A DAY  OF LASIX AND WILL CONTACT PROVIDER FOR FOLLOW UP BMET DUE TO FOLLOWUP VISIT FALLS IN BETWEEN THAT 7 TO 10 DAYS

## 2017-01-16 ENCOUNTER — Other Ambulatory Visit: Payer: Self-pay | Admitting: Internal Medicine

## 2017-01-20 ENCOUNTER — Ambulatory Visit (INDEPENDENT_AMBULATORY_CARE_PROVIDER_SITE_OTHER): Payer: Medicare HMO | Admitting: Physician Assistant

## 2017-01-20 ENCOUNTER — Ambulatory Visit (INDEPENDENT_AMBULATORY_CARE_PROVIDER_SITE_OTHER): Payer: Medicare HMO | Admitting: *Deleted

## 2017-01-20 VITALS — BP 132/62 | HR 80 | Ht 72.0 in | Wt 241.0 lb

## 2017-01-20 DIAGNOSIS — I482 Chronic atrial fibrillation, unspecified: Secondary | ICD-10-CM

## 2017-01-20 DIAGNOSIS — Z79899 Other long term (current) drug therapy: Secondary | ICD-10-CM | POA: Diagnosis not present

## 2017-01-20 DIAGNOSIS — I480 Paroxysmal atrial fibrillation: Secondary | ICD-10-CM | POA: Diagnosis not present

## 2017-01-20 DIAGNOSIS — I5030 Unspecified diastolic (congestive) heart failure: Secondary | ICD-10-CM

## 2017-01-20 DIAGNOSIS — Z86711 Personal history of pulmonary embolism: Secondary | ICD-10-CM | POA: Diagnosis not present

## 2017-01-20 DIAGNOSIS — I110 Hypertensive heart disease with heart failure: Secondary | ICD-10-CM

## 2017-01-20 DIAGNOSIS — I5032 Chronic diastolic (congestive) heart failure: Secondary | ICD-10-CM

## 2017-01-20 LAB — POCT INR: INR: 2.8

## 2017-01-20 MED ORDER — METOPROLOL SUCCINATE ER 50 MG PO TB24: 50.0000 mg | ORAL_TABLET | Freq: Every day | ORAL | 1 refills | Status: DC

## 2017-01-20 MED ORDER — FUROSEMIDE 80 MG PO TABS: 80.0000 mg | ORAL_TABLET | Freq: Every day | ORAL | 1 refills | Status: DC

## 2017-01-20 NOTE — Progress Notes (Signed)
Cardiology Office Note Date:  01/20/2017  Patient ID:  [name] [name], [name] [name] January 11, 1931, MRN [id number] PCP:  Hoyt Koch, MD  Cardiologist:  Dr. Rayann Heman   Chief Complaint: f/u on response to meds, PM check  History of Present Illness: [name] [name] is a 83 y.o. female with history of SSSx w/PPM, anxiety, AFib, chronic CHF (diastolic), CBP, DM, remote hx of DVT, HTN, chronic venous insufficiency comes in today to be seen for Dr. Rayann Heman, last seen by him 12/25/16, at that time despite amiodarone was still having a fair amount of AF, her BB was up-titrated and suspected that rate control strategies may be the ultimate POC, and not felt to be an ablation candidate.    She was seen by L.Dorene Ar, NP for ER f/u visit with c/o SOB, CXR with Stable cardiomegaly, changes of COPD and chronic bronchitis, aortic atherosclerosis and large hiatal hernia, she was found with fluid OL and recommended temp increase in her diuresis, suspected her PAF was contributing as well to her acute/chronic CHF.  The patient was seen in f/u by myself 01/10/17 accompanied by her daughter.  They reported then in  regards to her swelling, this had completely resolved.  Her weight was only down one pound but they report her initially as having severe swelling, and now back to baseline whih was none.  She fell unfortunately, 01/04/17.   She reported an event in the BR, urinated and upon standing feels like her legs just gave out.  She is certain she did not black out or faint, she recalls the event, did not hit her head or have an injury but was to weak to to get up.  She finally was able to pull a phone over and call her daughter who called 911.  The patient's son arrived 14st and by the time 66 arrived he had helped her up and to a chair, they report EMS found her vitals OK and given this and feeling OK she elected not to be transported.  She expressd concern over recently not being able to get to the BR in time and wetting  herself.  In general since her hospital stay in January she doesn't feel like she has gotten back to her self.  She stated particularly of late just feeling when she gets up from the toilet or up standing needs to hang on and collect herself before she can move about.    As it turned out after review of her medicines with her daughter, she had been getting 12m of lasix BID all along for MONTHS (not 458mas written).  She responded very well to the course of demadex to resolution of her edema, a BMET noted worse Creat, her lasix was reduced.  Her Rate response feature was turned on in effort to give her better exertional capacity  The patient's was counseled at length given her routine diet included a significant sodium content.   She comes today feeling some improvement,less SOB when up and around but not rsolved, no CP or sensation of palpitations at all, no falls, no dizziness, near syncope or syncope, no bleeding or signs of bleeding.  She feels like her swelling is still much improved from where it had been.   Device information: MDT dual chamber PPM, implanted 06/30/09, SSSx, Dr. AlRayann HemanF hx: AAD amiodarone, started Jan 2018 w/episode rapid AFlutter Hx of Flecainide d/c 2/2 CHF and marked PR prolongation to >40047mPast Medical History:  Diagnosis Date  . Acute gastric  ulcer without mention of hemorrhage, perforation, or obstruction 2006   EGD  . Anxiety   . Atrial fibrillation (HCC)    flecainide; coumadin  . Breast cancer ([place])    left  . Carotid artery disease ([place])    56/43: RICA 3-29%, LICA 51-88%  //  [place] 41/66: RICA 0-63%; LICA 01-60% >> FU 1 year   . Chronic diastolic heart failure (HCC)    a. echo 10/10: mild LVH, EF 55-60%, mild AI, mild MR, mild to mod LAE, mild RVE, severe RAE, mod TR, PASP 53  /  b.  Echo 3/14:  Mild LVH, EF 55%, Tr AI, MAC, mild MR, mod LAE, PASP 31, trivial eff. // c. echo 1/17: EF 55-60%, mild AI, MAC, mild MR, moderate BAE, moderate TR  .  Chronic lower back pain   . Diabetic peripheral neuropathy ([place]) 03/09/2015  . Diverticulosis   . Esophagitis, unspecified 2006   EGD  . Hiatal hernia 2006   EGD  . History of nuclear stress test    a. Myoview 2/12: EF 69%, no scar or ischemia  . Hx of adenomatous colonic polyps   . Hypertension   . Mediastinal lymphadenopathy   . Memory difficulty 11/14/2016  . Mitral regurgitation    mild, echo, October, 2010  . Osteopenia   . Presence of permanent cardiac pacemaker   . Pulmonary embolus [facility]) 2006   2006, with DVT  . Pulmonary HTN ([place])    53 mmHg echo, 2010, moderate TR, mild right ventricular enlargement  . Tachy-brady syndrome ([place])    s/p pacer 07/2009  . Thyroid nodule    non-neoplastic goiter  . Type II diabetes mellitus ([place])   . Venous insufficiency    Chronic    Past Surgical History:  Procedure Laterality Date  . ABDOMINAL HYSTERECTOMY    . ANKLE FRACTURE SURGERY Right   . BACK SURGERY    . BREAST LUMPECTOMY Left   . FRACTURE SURGERY    . INSERT / REPLACE / REMOVE PACEMAKER  06/30/2009   MDT Adalpta L implanted by Dr Olevia Perches  . LUMBAR LAMINECTOMY  1973, 06/22/2011   "no hardware either time"    Current Outpatient Prescriptions  Medication Sig Dispense Refill  . ALPRAZolam (XANAX) 0.25 MG tablet Take 1 tablet (0.25 mg total) by mouth 2 (two) times daily as needed for anxiety. 20 tablet 0  . amiodarone (PACERONE) 200 MG tablet Take 1 tablet (200 mg total) by mouth daily. 30 tablet 9  . furosemide (LASIX) 80 MG tablet Take 0.5 tablets (40 mg total) by mouth 2 (two) times daily. 90 tablet 3  . gabapentin (NEURONTIN) 300 MG capsule Take 600 mg by mouth 2 (two) times daily.     Marland Kitchen HYDROcodone-acetaminophen (NORCO/VICODIN) 5-325 MG tablet Take 0.5-1 tablets by mouth 2 (two) times daily as needed for moderate pain. 30 tablet 0  . hydrocortisone 2.5 % cream Apply 1 application topically as needed (for itching).    . metFORMIN (GLUCOPHAGE) 500 MG tablet Take 500 mg by  mouth daily with breakfast.     . metoprolol succinate (TOPROL-XL) 25 MG 24 hr tablet Take 1 tablet (25 mg total) by mouth daily. 90 tablet 3  . potassium chloride SA (K-DUR,KLOR-CON) 20 MEQ tablet Take 20 mEq by mouth daily.    . promethazine (PHENERGAN) 25 MG tablet Take 1 tablet (25 mg total) by mouth every 8 (eight) hours as needed for nausea or vomiting. 20 tablet 0  . pyridoxine (B-6) 200  MG tablet Take 200 mg by mouth daily.    . vitamin B-12 (CYANOCOBALAMIN) 500 MCG tablet Take 500 mcg by mouth daily.     Marland Kitchen warfarin (COUMADIN) 5 MG tablet TAKE 1/2 TABLET EVERY DAY EXCEPT 1 TABLET ON THURSDAY 30 tablet 3   No current facility-administered medications for this visit.     Allergies:   Flecainide; Morphine and related; and Cephalexin   Social History:  The patient  reports that she quit smoking about 18 years ago. Her smoking use included Cigarettes. She has a 47.00 pack-year smoking history. She has never used smokeless tobacco. She reports that she drinks alcohol. She reports that she does not use drugs.   Family History:  The patient's family history includes COPD in her father; Heart disease in her mother; Hypertension in her brother, daughter, father, mother, and sister.   ROS:  Please see the history of present illness.  All other systems are reviewed and otherwise negative.   PHYSICAL EXAM:  VS:  There were no vitals taken for this visit. BMI: There is no height or weight on file to calculate BMI. Well nourished, well developed, in no acute distress  HEENT: normocephalic, atraumatic  Neck: no JVD, carotid bruits or masses Cardiac:  RRR; no significant murmurs, no rubs, or gallops Lungs:  CTA b/l, no wheezing, rhonchi or rales  Abd: soft, nontender MS: no deformity or atrophy Ext: 1+edema, chronic looking skin changes Skin: warm and dry, no rash Neuro:  No gross deficits appreciated Psych: euthymic mood, full affect  PPM site is stable, no tethering or discomfort   EKG:   Done 12/31/16: Apaced, V sensed PPM interrogation done by industry today and reviewed by myself: battery and lead measurements are stable/OK, she is 77.5%AP/VS, she continues to have AMS episodes, AF burden is up some  06/17/16: Gated myoview Study Highlights   Nuclear stress EF: 69%.  There was no ST segment deviation noted during stress.  Defect 1: There is a small defect of mild severity present in the basal inferolateral and mid inferolateral location. This defect is partially reversible and could represent a very small area of ischemia. Clinical correlation recommended.  This is a low risk study.  The left ventricular ejection fraction is hyperdynamic (>65%).    04/13/16 ECHO Study Conclusions - Left ventricle: The cavity size was normal. There was mild concentric hypertrophy. Systolic function was normal. The estimated ejection fraction was in the range of 55% to 60%. Wall motion was normal; there were no regional wall motion abnormalities. Doppler parameters are consistent with restrictive physiology, indicative of decreased left ventricular diastolic compliance and/or increased left atrial pressure. Doppler parameters are consistent with elevated ventricular end-diastolic filling pressure. - Aortic valve: There was mild regurgitation. - Aortic root: The aortic root was normal in size. - Ascending aorta: The ascending aorta was normal in size. - Mitral valve: Calcified annulus. Mildly thickened leaflets . There was mild regurgitation. - Left atrium: The atrium was moderately dilated. - Right ventricle: Pacer wire or catheter noted in right ventricle. - Right atrium: Pacer wire or catheter noted in right atrium. - Tricuspid valve: There was moderate regurgitation. - Pulmonic valve: There was no regurgitation. - Pulmonary arteries: Systolic pressure was mildly increased. PA peak pressure: 38 mm Hg (S). - Inferior vena cava: The vessel was normal in  size. - Pericardium, extracardiac: There was no pericardial effusion.    Recent Labs: 10/14/2016: B Natriuretic Peptide 160.1 11/10/2016: Magnesium 1.9 12/28/2016: Hemoglobin 10.0 01/10/2017: ALT  10; BUN 50; Creatinine, Ser 2.09; Platelets 367; Potassium 4.2; Sodium 135; TSH 1.650  No results found for requested labs within last 8760 hours.   Estimated Creatinine Clearance: 27.4 mL/min (A) (by C-G formula based on SCr of 2.09 mg/dL (H)).   Wt Readings from Last 3 Encounters:  01/13/17 235 lb (106.6 kg)  01/10/17 233 lb (105.7 kg)  12/31/16 234 lb (106.1 kg)     Other studies reviewed: Additional studies/records reviewed today include: summarized above  ASSESSMENT AND PLAN:  1. CHF disatolic     She has edema today and her weight is up on the 65m BID dosing of her lasix     Will resume her lasix 874mAM and 40 PM, recheck her BMET today.       She eats a significant amount of sodium especially hot dogs routinely as well as potato chips, she mentions today last week tended to eat canned soups though did choose low sodium, we re-discussed this at length as well as daily weights, and support stockings       2. Paroxysmal AFib     CHA2Ds2Vasc is 5, on warfarin, monitored and managed with the coumadin clnic     She continues to have SR (assuming not undersensing her AF) so will continue her amiodarone     01/10/17 CBC, LFTs, TSH were wnl     Increase her Toprol to 5052maily  3. HTN    No orthostatic symptoms    Looks OK  4. PPM     Normal device function, rate response was adjusted with a fairly flat HR histogram (see PaceArt)   I previously inquired about her at home, she lives by herself but has good family support and her daughter takes care of her medicines  Disposition:   109mo71month, sooner if needed.    Current medicines are reviewed at length with the patient today.  The patient did not have any concerns regarding medicines.  SignHaywood Lasso-C 01/20/2017  10:32 AM     [place] [phone])  ([phone])

## 2017-01-20 NOTE — Patient Instructions (Addendum)
Medication Instructions:   START TAKING 80 MG LASIX IN THE AM AND 40 MG IN THE PM   START TAKING TOPROL 50 MG ONCE A DAY   If you need a refill on your cardiac medications before your next appointment, please call your pharmacy.  Labwork: BMET TODAY    Testing/Procedures: NONE ORDERED  TODAY     Follow-Up: IN 3 MONTHS W ITH RENEE    Any Other Special Instructions Will Be Listed Below (If Applicable).

## 2017-01-21 LAB — BASIC METABOLIC PANEL
BUN/Creatinine Ratio: 16 (ref 12–28)
BUN: 29 mg/dL — AB (ref 8–27)
CALCIUM: 9.3 mg/dL (ref 8.7–10.3)
CHLORIDE: 98 mmol/L (ref 96–106)
CO2: 27 mmol/L (ref 18–29)
CREATININE: 1.8 mg/dL — AB (ref 0.57–1.00)
GFR, EST AFRICAN AMERICAN: 29 mL/min/{1.73_m2} — AB (ref >59)
GFR, EST NON AFRICAN AMERICAN: 25 mL/min/{1.73_m2} — AB (ref >59)
Glucose: 81 mg/dL (ref 65–99)
Potassium: 4.8 mmol/L (ref 3.5–5.2)
Sodium: 142 mmol/L (ref 134–144)

## 2017-01-27 ENCOUNTER — Other Ambulatory Visit: Payer: Self-pay | Admitting: Internal Medicine

## 2017-01-27 MED ORDER — ALPRAZOLAM 0.25 MG PO TABS: 0.2500 mg | ORAL_TABLET | Freq: Two times a day (BID) | ORAL | 1 refills | Status: DC | PRN

## 2017-02-10 ENCOUNTER — Ambulatory Visit (INDEPENDENT_AMBULATORY_CARE_PROVIDER_SITE_OTHER): Payer: Medicare HMO | Admitting: *Deleted

## 2017-02-10 DIAGNOSIS — Z86711 Personal history of pulmonary embolism: Secondary | ICD-10-CM

## 2017-02-10 DIAGNOSIS — I482 Chronic atrial fibrillation, unspecified: Secondary | ICD-10-CM

## 2017-02-10 LAB — POCT INR: INR: 2.8

## 2017-02-19 ENCOUNTER — Ambulatory Visit (INDEPENDENT_AMBULATORY_CARE_PROVIDER_SITE_OTHER): Payer: Medicare HMO | Admitting: Internal Medicine

## 2017-02-19 ENCOUNTER — Encounter: Payer: Self-pay | Admitting: Internal Medicine

## 2017-02-19 VITALS — BP 134/80 | HR 83 | Resp 14 | Ht 72.0 in | Wt 247.0 lb

## 2017-02-19 DIAGNOSIS — I5031 Acute diastolic (congestive) heart failure: Secondary | ICD-10-CM

## 2017-02-19 DIAGNOSIS — I5032 Chronic diastolic (congestive) heart failure: Secondary | ICD-10-CM

## 2017-02-19 NOTE — Progress Notes (Signed)
   Subjective:    Patient ID: [name] [name], female    DOB: 1931/03/08, 83 y.o.   MRN: [id number]  HPI The patient is an 83 YO female coming in for worsening swelling in her ankles. She has been wearing the compression stockings for the last 2 days or so which has helped some. She is having some pain in the ankles. She has had a 7 pound weight gain since last time. More SOB with exertion. She is still taking her lasix 80 mg qam and 40 mg qpm. Some dietary lapses recently but has been trying to be more mindful about sodium intake. Having more gait instability due to the fluid and SOB.   Review of Systems  Constitutional: Positive for activity change and fatigue. Negative for appetite change, chills, fever and unexpected weight change.  HENT: Negative.   Eyes: Negative.   Respiratory: Positive for shortness of breath. Negative for cough, chest tightness and wheezing.   Cardiovascular: Positive for leg swelling. Negative for chest pain and palpitations.  Gastrointestinal: Negative.   Musculoskeletal: Positive for arthralgias, back pain and gait problem.      Objective:   Physical Exam  Constitutional: She is oriented to person, place, and time. She appears well-developed and well-nourished.  HENT:  Head: Normocephalic and atraumatic.  Eyes: EOM are normal.  Neck: Normal range of motion. No JVD present.  Cardiovascular: Normal rate.   Pulmonary/Chest: Effort normal. No respiratory distress. She has no wheezes. She has no rales.  Abdominal: Soft. Bowel sounds are normal. She exhibits no distension. There is no tenderness. There is no rebound and no guarding.  Musculoskeletal: She exhibits edema.  2+ pitting edema bilaterally to the knees  Neurological: She is alert and oriented to person, place, and time. Coordination abnormal.  Unsteady gait even with cane  Skin: Skin is warm and dry.   Vitals:   02/19/17 0916  BP: 134/80  Pulse: 83  Resp: 14  SpO2: 95%  Weight: 247 lb (112 kg)    Height: 6' (1.829 m)      Assessment & Plan:

## 2017-02-19 NOTE — Patient Instructions (Signed)
We will increase the lasix (furosemide) to 80 mg in the morning and 80 mg after lunchtime for 5 days.

## 2017-02-21 NOTE — Assessment & Plan Note (Signed)
With exacerbation today with more swelling, SOB and 7 pound weight gain. Advised to increase to 80 mg BID (after breakfast and lunch) and continue wearing compression hose. Advised again how important her diet is with retention of fluid. Call back if not improved.

## 2017-03-05 ENCOUNTER — Telehealth: Payer: Self-pay | Admitting: Internal Medicine

## 2017-03-05 ENCOUNTER — Encounter: Payer: Self-pay | Admitting: Internal Medicine

## 2017-03-05 NOTE — Telephone Encounter (Signed)
I sent referral to [facility] earlier this afternoon and they will be calling pt. I informed pt's daughter and she wanted me to tell you thank you

## 2017-03-10 ENCOUNTER — Ambulatory Visit (INDEPENDENT_AMBULATORY_CARE_PROVIDER_SITE_OTHER): Payer: Medicare HMO

## 2017-03-10 DIAGNOSIS — I482 Chronic atrial fibrillation, unspecified: Secondary | ICD-10-CM

## 2017-03-10 DIAGNOSIS — Z86711 Personal history of pulmonary embolism: Secondary | ICD-10-CM | POA: Diagnosis not present

## 2017-03-10 LAB — POCT INR: INR: 4.1

## 2017-03-13 ENCOUNTER — Encounter: Payer: Self-pay | Admitting: Internal Medicine

## 2017-03-14 ENCOUNTER — Emergency Department (HOSPITAL_COMMUNITY): Payer: Medicare HMO

## 2017-03-14 ENCOUNTER — Other Ambulatory Visit: Payer: Self-pay | Admitting: Physician Assistant

## 2017-03-14 ENCOUNTER — Encounter (HOSPITAL_COMMUNITY): Payer: Self-pay | Admitting: *Deleted

## 2017-03-14 ENCOUNTER — Observation Stay (HOSPITAL_COMMUNITY)
Admission: EM | Admit: 2017-03-14 | Discharge: 2017-03-15 | Disposition: A | Discharge status: Home / Self Care | Payer: Medicare HMO | Attending: Family Medicine | Admitting: Family Medicine

## 2017-03-14 DIAGNOSIS — I340 Nonrheumatic mitral (valve) insufficiency: Secondary | ICD-10-CM | POA: Insufficient documentation

## 2017-03-14 DIAGNOSIS — I4892 Unspecified atrial flutter: Secondary | ICD-10-CM | POA: Insufficient documentation

## 2017-03-14 DIAGNOSIS — Z836 Family history of other diseases of the respiratory system: Secondary | ICD-10-CM | POA: Insufficient documentation

## 2017-03-14 DIAGNOSIS — T82111A Breakdown (mechanical) of cardiac pulse generator (battery), initial encounter: Secondary | ICD-10-CM | POA: Diagnosis not present

## 2017-03-14 DIAGNOSIS — I472 Ventricular tachycardia, unspecified: Secondary | ICD-10-CM

## 2017-03-14 DIAGNOSIS — Y712 Prosthetic and other implants, materials and accessory cardiovascular devices associated with adverse incidents: Secondary | ICD-10-CM | POA: Diagnosis not present

## 2017-03-14 DIAGNOSIS — Z8719 Personal history of other diseases of the digestive system: Secondary | ICD-10-CM | POA: Insufficient documentation

## 2017-03-14 DIAGNOSIS — E119 Type 2 diabetes mellitus without complications: Secondary | ICD-10-CM | POA: Diagnosis not present

## 2017-03-14 DIAGNOSIS — I700 Atherosclerosis of aorta: Secondary | ICD-10-CM | POA: Insufficient documentation

## 2017-03-14 DIAGNOSIS — E1122 Type 2 diabetes mellitus with diabetic chronic kidney disease: Secondary | ICD-10-CM | POA: Insufficient documentation

## 2017-03-14 DIAGNOSIS — I5032 Chronic diastolic (congestive) heart failure: Secondary | ICD-10-CM | POA: Diagnosis not present

## 2017-03-14 DIAGNOSIS — Z86711 Personal history of pulmonary embolism: Secondary | ICD-10-CM | POA: Insufficient documentation

## 2017-03-14 DIAGNOSIS — R0609 Other forms of dyspnea: Secondary | ICD-10-CM | POA: Diagnosis present

## 2017-03-14 DIAGNOSIS — E041 Nontoxic single thyroid nodule: Secondary | ICD-10-CM | POA: Insufficient documentation

## 2017-03-14 DIAGNOSIS — I4581 Long QT syndrome: Secondary | ICD-10-CM | POA: Insufficient documentation

## 2017-03-14 DIAGNOSIS — I4891 Unspecified atrial fibrillation: Secondary | ICD-10-CM

## 2017-03-14 DIAGNOSIS — K449 Diaphragmatic hernia without obstruction or gangrene: Secondary | ICD-10-CM | POA: Insufficient documentation

## 2017-03-14 DIAGNOSIS — E785 Hyperlipidemia, unspecified: Secondary | ICD-10-CM | POA: Insufficient documentation

## 2017-03-14 DIAGNOSIS — M8580 Other specified disorders of bone density and structure, unspecified site: Secondary | ICD-10-CM | POA: Insufficient documentation

## 2017-03-14 DIAGNOSIS — R296 Repeated falls: Secondary | ICD-10-CM | POA: Insufficient documentation

## 2017-03-14 DIAGNOSIS — R0602 Shortness of breath: Secondary | ICD-10-CM

## 2017-03-14 DIAGNOSIS — E1142 Type 2 diabetes mellitus with diabetic polyneuropathy: Secondary | ICD-10-CM | POA: Diagnosis not present

## 2017-03-14 DIAGNOSIS — G8929 Other chronic pain: Secondary | ICD-10-CM | POA: Diagnosis not present

## 2017-03-14 DIAGNOSIS — Z885 Allergy status to narcotic agent status: Secondary | ICD-10-CM | POA: Insufficient documentation

## 2017-03-14 DIAGNOSIS — Z7901 Long term (current) use of anticoagulants: Secondary | ICD-10-CM | POA: Insufficient documentation

## 2017-03-14 DIAGNOSIS — Z8249 Family history of ischemic heart disease and other diseases of the circulatory system: Secondary | ICD-10-CM | POA: Insufficient documentation

## 2017-03-14 DIAGNOSIS — Z881 Allergy status to other antibiotic agents status: Secondary | ICD-10-CM | POA: Diagnosis not present

## 2017-03-14 DIAGNOSIS — I495 Sick sinus syndrome: Secondary | ICD-10-CM | POA: Insufficient documentation

## 2017-03-14 DIAGNOSIS — I130 Hypertensive heart and chronic kidney disease with heart failure and stage 1 through stage 4 chronic kidney disease, or unspecified chronic kidney disease: Secondary | ICD-10-CM | POA: Insufficient documentation

## 2017-03-14 DIAGNOSIS — I4819 Other persistent atrial fibrillation: Secondary | ICD-10-CM

## 2017-03-14 DIAGNOSIS — M549 Dorsalgia, unspecified: Secondary | ICD-10-CM | POA: Insufficient documentation

## 2017-03-14 DIAGNOSIS — I10 Essential (primary) hypertension: Secondary | ICD-10-CM

## 2017-03-14 DIAGNOSIS — I872 Venous insufficiency (chronic) (peripheral): Secondary | ICD-10-CM | POA: Insufficient documentation

## 2017-03-14 DIAGNOSIS — I272 Pulmonary hypertension, unspecified: Secondary | ICD-10-CM | POA: Insufficient documentation

## 2017-03-14 DIAGNOSIS — Z7984 Long term (current) use of oral hypoglycemic drugs: Secondary | ICD-10-CM | POA: Insufficient documentation

## 2017-03-14 DIAGNOSIS — T82198A Other mechanical complication of other cardiac electronic device, initial encounter: Secondary | ICD-10-CM | POA: Insufficient documentation

## 2017-03-14 DIAGNOSIS — Z90710 Acquired absence of both cervix and uterus: Secondary | ICD-10-CM | POA: Insufficient documentation

## 2017-03-14 DIAGNOSIS — N184 Chronic kidney disease, stage 4 (severe): Secondary | ICD-10-CM | POA: Diagnosis present

## 2017-03-14 DIAGNOSIS — I480 Paroxysmal atrial fibrillation: Secondary | ICD-10-CM | POA: Diagnosis not present

## 2017-03-14 DIAGNOSIS — Z79899 Other long term (current) drug therapy: Secondary | ICD-10-CM | POA: Insufficient documentation

## 2017-03-14 DIAGNOSIS — R531 Weakness: Secondary | ICD-10-CM | POA: Diagnosis not present

## 2017-03-14 DIAGNOSIS — F419 Anxiety disorder, unspecified: Secondary | ICD-10-CM | POA: Diagnosis not present

## 2017-03-14 DIAGNOSIS — T829XXA Unspecified complication of cardiac and vascular prosthetic device, implant and graft, initial encounter: Secondary | ICD-10-CM | POA: Diagnosis present

## 2017-03-14 DIAGNOSIS — R404 Transient alteration of awareness: Secondary | ICD-10-CM | POA: Diagnosis not present

## 2017-03-14 DIAGNOSIS — R0902 Hypoxemia: Secondary | ICD-10-CM | POA: Insufficient documentation

## 2017-03-14 DIAGNOSIS — Z853 Personal history of malignant neoplasm of breast: Secondary | ICD-10-CM | POA: Insufficient documentation

## 2017-03-14 DIAGNOSIS — Z87891 Personal history of nicotine dependence: Secondary | ICD-10-CM | POA: Insufficient documentation

## 2017-03-14 DIAGNOSIS — I2510 Atherosclerotic heart disease of native coronary artery without angina pectoris: Secondary | ICD-10-CM | POA: Diagnosis not present

## 2017-03-14 DIAGNOSIS — Z888 Allergy status to other drugs, medicaments and biological substances status: Secondary | ICD-10-CM | POA: Insufficient documentation

## 2017-03-14 DIAGNOSIS — Z950 Presence of cardiac pacemaker: Secondary | ICD-10-CM | POA: Diagnosis not present

## 2017-03-14 DIAGNOSIS — Z86010 Personal history of colonic polyps: Secondary | ICD-10-CM | POA: Insufficient documentation

## 2017-03-14 DIAGNOSIS — I517 Cardiomegaly: Secondary | ICD-10-CM | POA: Diagnosis not present

## 2017-03-14 DIAGNOSIS — T82119A Breakdown (mechanical) of unspecified cardiac electronic device, initial encounter: Secondary | ICD-10-CM | POA: Diagnosis not present

## 2017-03-14 DIAGNOSIS — D509 Iron deficiency anemia, unspecified: Secondary | ICD-10-CM | POA: Insufficient documentation

## 2017-03-14 DIAGNOSIS — Z9889 Other specified postprocedural states: Secondary | ICD-10-CM | POA: Insufficient documentation

## 2017-03-14 LAB — COMPREHENSIVE METABOLIC PANEL
ALBUMIN: 3.4 g/dL — AB (ref 3.5–5.0)
ALK PHOS: 125 U/L (ref 38–126)
ALT: 16 U/L (ref 14–54)
AST: 27 U/L (ref 15–41)
Anion gap: 13 (ref 5–15)
BILIRUBIN TOTAL: 0.7 mg/dL (ref 0.3–1.2)
BUN: 31 mg/dL — ABNORMAL HIGH (ref 6–20)
CALCIUM: 9.2 mg/dL (ref 8.9–10.3)
CO2: 26 mmol/L (ref 22–32)
CREATININE: 2.18 mg/dL — AB (ref 0.44–1.00)
Chloride: 98 mmol/L — ABNORMAL LOW (ref 101–111)
GFR calc non Af Amer: 20 mL/min — ABNORMAL LOW (ref >60)
GFR, EST AFRICAN AMERICAN: 23 mL/min — AB (ref >60)
GLUCOSE: 181 mg/dL — AB (ref 65–99)
Potassium: 4.7 mmol/L (ref 3.5–5.1)
SODIUM: 137 mmol/L (ref 135–145)
TOTAL PROTEIN: 7.7 g/dL (ref 6.5–8.1)

## 2017-03-14 LAB — URINALYSIS, ROUTINE W REFLEX MICROSCOPIC
Bilirubin Urine: NEGATIVE
GLUCOSE, UA: NEGATIVE mg/dL
Hgb urine dipstick: NEGATIVE
KETONES UR: NEGATIVE mg/dL
LEUKOCYTES UA: NEGATIVE
NITRITE: NEGATIVE
PROTEIN: NEGATIVE mg/dL
Specific Gravity, Urine: 1.011 (ref 1.005–1.030)
pH: 7 (ref 5.0–8.0)

## 2017-03-14 LAB — CBC WITH DIFFERENTIAL/PLATELET
BASOS PCT: 2 %
Basophils Absolute: 0.1 10*3/uL (ref 0.0–0.1)
EOS ABS: 0.1 10*3/uL (ref 0.0–0.7)
Eosinophils Relative: 3 %
HCT: 33.6 % — ABNORMAL LOW (ref 36.0–46.0)
Hemoglobin: 9.7 g/dL — ABNORMAL LOW (ref 12.0–15.0)
Lymphocytes Relative: 19 %
Lymphs Abs: 0.8 10*3/uL (ref 0.7–4.0)
MCH: 23.5 pg — ABNORMAL LOW (ref 26.0–34.0)
MCHC: 28.9 g/dL — AB (ref 30.0–36.0)
MCV: 81.4 fL (ref 78.0–100.0)
MONO ABS: 0.2 10*3/uL (ref 0.1–1.0)
MONOS PCT: 4 %
Neutro Abs: 3.2 10*3/uL (ref 1.7–7.7)
Neutrophils Relative %: 72 %
PLATELETS: 430 10*3/uL — AB (ref 150–400)
RBC: 4.13 MIL/uL (ref 3.87–5.11)
RDW: 15.4 % (ref 11.5–15.5)
WBC: 4.4 10*3/uL (ref 4.0–10.5)

## 2017-03-14 LAB — PROTIME-INR
INR: 2.5
PROTHROMBIN TIME: 27.5 s — AB (ref 11.4–15.2)

## 2017-03-14 LAB — GLUCOSE, CAPILLARY
Glucose-Capillary: 108 mg/dL — ABNORMAL HIGH (ref 65–99)
Glucose-Capillary: 239 mg/dL — ABNORMAL HIGH (ref 65–99)

## 2017-03-14 LAB — BRAIN NATRIURETIC PEPTIDE: B NATRIURETIC PEPTIDE 5: 284.4 pg/mL — AB (ref 0.0–100.0)

## 2017-03-14 MED ORDER — WARFARIN SODIUM 2.5 MG PO TABS
2.5000 mg | ORAL_TABLET | Freq: Every day | ORAL | Status: DC
  Administered 2017-03-14: 2.5 mg via ORAL
  Filled 2017-03-14: qty 1

## 2017-03-14 MED ORDER — METOPROLOL SUCCINATE ER 50 MG PO TB24
50.0000 mg | ORAL_TABLET | Freq: Every day | ORAL | Status: DC
  Administered 2017-03-14 – 2017-03-15 (×2): 50 mg via ORAL
  Filled 2017-03-14 (×2): qty 1

## 2017-03-14 MED ORDER — INSULIN ASPART 100 UNIT/ML ~~LOC~~ SOLN
0.0000 [IU] | Freq: Every day | SUBCUTANEOUS | Status: DC
  Administered 2017-03-14: 2 [IU] via SUBCUTANEOUS

## 2017-03-14 MED ORDER — AMIODARONE HCL IN DEXTROSE 360-4.14 MG/200ML-% IV SOLN: 30.0000 mg/h | INTRAVENOUS | Status: DC

## 2017-03-14 MED ORDER — SODIUM CHLORIDE 0.9% FLUSH
3.0000 mL | Freq: Two times a day (BID) | INTRAVENOUS | Status: DC
  Administered 2017-03-15: 3 mL via INTRAVENOUS

## 2017-03-14 MED ORDER — INSULIN ASPART 100 UNIT/ML ~~LOC~~ SOLN
0.0000 [IU] | Freq: Three times a day (TID) | SUBCUTANEOUS | Status: DC
  Administered 2017-03-15 (×2): 1 [IU] via SUBCUTANEOUS

## 2017-03-14 MED ORDER — POTASSIUM CHLORIDE 20 MEQ/15ML (10%) PO SOLN: 20.0000 meq | Freq: Every day | ORAL | Status: DC | PRN

## 2017-03-14 MED ORDER — ONDANSETRON HCL 4 MG PO TABS: 4.0000 mg | ORAL_TABLET | Freq: Four times a day (QID) | ORAL | Status: DC | PRN

## 2017-03-14 MED ORDER — ACETAMINOPHEN 325 MG PO TABS: 650.0000 mg | ORAL_TABLET | Freq: Four times a day (QID) | ORAL | Status: DC | PRN

## 2017-03-14 MED ORDER — AMIODARONE HCL IN DEXTROSE 360-4.14 MG/200ML-% IV SOLN
60.0000 mg/h | INTRAVENOUS | Status: DC
  Administered 2017-03-14: 60 mg/h via INTRAVENOUS

## 2017-03-14 MED ORDER — FUROSEMIDE 80 MG PO TABS
80.0000 mg | ORAL_TABLET | Freq: Two times a day (BID) | ORAL | Status: DC
  Administered 2017-03-14 – 2017-03-15 (×2): 80 mg via ORAL
  Filled 2017-03-14 (×2): qty 1

## 2017-03-14 MED ORDER — ACETAMINOPHEN 650 MG RE SUPP: 650.0000 mg | Freq: Four times a day (QID) | RECTAL | Status: DC | PRN

## 2017-03-14 MED ORDER — AMIODARONE LOAD VIA INFUSION: 150.0000 mg | Freq: Once | INTRAVENOUS | Status: DC

## 2017-03-14 MED ORDER — AMIODARONE HCL IN DEXTROSE 360-4.14 MG/200ML-% IV SOLN
INTRAVENOUS | Status: AC
  Filled 2017-03-14: qty 200

## 2017-03-14 MED ORDER — AMIODARONE LOAD VIA INFUSION
150.0000 mg | Freq: Once | INTRAVENOUS | Status: DC
  Filled 2017-03-14: qty 83.34

## 2017-03-14 MED ORDER — SODIUM CHLORIDE 0.9 % IV SOLN
INTRAVENOUS | Status: DC
  Administered 2017-03-14: 10:00:00 via INTRAVENOUS

## 2017-03-14 MED ORDER — ONDANSETRON HCL 4 MG/2ML IJ SOLN: 4.0000 mg | Freq: Four times a day (QID) | INTRAMUSCULAR | Status: DC | PRN

## 2017-03-14 MED ORDER — AMIODARONE HCL 200 MG PO TABS
200.0000 mg | ORAL_TABLET | Freq: Every day | ORAL | Status: DC
  Administered 2017-03-14 – 2017-03-15 (×2): 200 mg via ORAL
  Filled 2017-03-14 (×2): qty 1

## 2017-03-14 MED ORDER — AMIODARONE IV BOLUS ONLY 150 MG/100ML
150.0000 mg | Freq: Once | INTRAVENOUS | Status: DC
  Administered 2017-03-14: 150 mg via INTRAVENOUS

## 2017-03-14 MED ORDER — WARFARIN - PHYSICIAN DOSING INPATIENT
Freq: Every day | Status: DC
  Administered 2017-03-14: 18:00:00

## 2017-03-14 MED ORDER — ALPRAZOLAM 0.25 MG PO TABS: 0.2500 mg | ORAL_TABLET | Freq: Two times a day (BID) | ORAL | Status: DC | PRN

## 2017-03-14 MED ORDER — HYDROCODONE-ACETAMINOPHEN 5-325 MG PO TABS: 0.5000 | ORAL_TABLET | Freq: Two times a day (BID) | ORAL | Status: DC | PRN

## 2017-03-14 MED ORDER — IPRATROPIUM-ALBUTEROL 0.5-2.5 (3) MG/3ML IN SOLN: 3.0000 mL | RESPIRATORY_TRACT | Status: DC | PRN

## 2017-03-14 MED ORDER — GABAPENTIN 300 MG PO CAPS
600.0000 mg | ORAL_CAPSULE | Freq: Two times a day (BID) | ORAL | Status: DC
  Administered 2017-03-14 – 2017-03-15 (×2): 600 mg via ORAL
  Filled 2017-03-14 (×2): qty 2

## 2017-03-14 NOTE — ED Provider Notes (Addendum)
[facility] DEPT Provider Note   CSN: [id number] Arrival date & time: 03/14/17  4097     History   Chief Complaint Chief Complaint  Patient presents with  . Shortness of Breath    HPI [name] [name] is a 83 y.o. female.  Patient called EMS for area her alert button. Patient was feeling short of breath and not feeling well. Denies any pain anywhere. EMS noted in route to runs of V. tach. Brought in rhythm strips confirmed by me. Patient has a pacemaker followed by cardiology group here. Sean pacemaker for atrial fibrillation. Patient is also on Coumadin. Patient also has a history of chronic diastolic heart failure. Patient is also on MRI around to help control the atrial fibrillation best thing to help E Rich currently taking 200 mg once day. Did not have today's dose. But has been taking in as directed. Last seen by cardiology in April and last seen by her primary care provider the end of May. Patient's pacemaker is not a defibrillator type. They believe it's made by [employer].      Past Medical History:  Diagnosis Date  . Acute gastric ulcer without mention of hemorrhage, perforation, or obstruction 2006   EGD  . Anxiety   . Atrial fibrillation (HCC)    flecainide; coumadin  . Breast cancer ([place])    left  . Carotid artery disease ([place])    35/32: RICA 9-92%, LICA 42-68%  //  [place] 34/19: RICA 6-22%; LICA 29-79% >> FU 1 year   . Chronic diastolic heart failure (HCC)    a. echo 10/10: mild LVH, EF 55-60%, mild AI, mild MR, mild to mod LAE, mild RVE, severe RAE, mod TR, PASP 53  /  b.  Echo 3/14:  Mild LVH, EF 55%, Tr AI, MAC, mild MR, mod LAE, PASP 31, trivial eff. // c. echo 1/17: EF 55-60%, mild AI, MAC, mild MR, moderate BAE, moderate TR  . Chronic lower back pain   . Diabetic peripheral neuropathy ([place]) 03/09/2015  . Diverticulosis   . Esophagitis, unspecified 2006   EGD  . Hiatal hernia 2006   EGD  . History of nuclear stress test    a. Myoview 2/12: EF  69%, no scar or ischemia  . Hx of adenomatous colonic polyps   . Hypertension   . Mediastinal lymphadenopathy   . Memory difficulty 11/14/2016  . Mitral regurgitation    mild, echo, October, 2010  . Osteopenia   . Presence of permanent cardiac pacemaker   . Pulmonary embolus [facility]) 2006   2006, with DVT  . Pulmonary HTN ([place])    53 mmHg echo, 2010, moderate TR, mild right ventricular enlargement  . Tachy-brady syndrome ([place])    s/p pacer 07/2009  . Thyroid nodule    non-neoplastic goiter  . Type II diabetes mellitus ([place])   . Venous insufficiency    Chronic    Patient Active Problem List   Diagnosis Date Noted  . Recurrent falls 01/13/2017  . Cognitive change   . A-fib ([place]) 10/14/2016  . Chronic back pain 07/28/2016  . Diabetes mellitus type 2, noninsulin dependent ([place])   . CRD (chronic renal disease), stage III 04/12/2016  . Acute on chronic renal insufficiency 04/12/2016  . Hypertensive heart disease 03/28/2016  . Anemia, iron deficiency 10/22/2015  . Diabetic peripheral neuropathy ([place]) 03/09/2015  . Chronic diastolic CHF (congestive heart failure) ([place]) 09/07/2014  . Sick sinus syndrome ([place]) 03/09/2014  . Hypertension 06/21/2013  . Osteopenia   .  Pulmonary HTN ([place])   . History of pulmonary embolism   . Carotid artery disease ([place])   . History of breast cancer   . Pacemaker-[employer]   . Syncope   . Mitral regurgitation   . Mediastinal lymphadenopathy   . THYROID NODULE, HX OF 07/19/2009    Past Surgical History:  Procedure Laterality Date  . ABDOMINAL HYSTERECTOMY    . ANKLE FRACTURE SURGERY Right   . BACK SURGERY    . BREAST LUMPECTOMY Left   . FRACTURE SURGERY    . INSERT / REPLACE / REMOVE PACEMAKER  06/30/2009   MDT Adalpta L implanted by Dr Olevia Perches  . LUMBAR LAMINECTOMY  1973, 06/22/2011   "no hardware either time"    OB History    No data available       Home Medications    Prior to Admission medications   Medication Sig Start Date End  Date Taking? Authorizing Provider  amiodarone (PACERONE) 200 MG tablet Take 1 tablet (200 mg total) by mouth daily. 11/20/16  Yes Allred, Jeneen Rinks, MD  furosemide (LASIX) 80 MG tablet Take 1 tablet (80 mg total) by mouth daily. TAKE 80 MG IN AM AND 40 MG IN PM Patient taking differently: Take 80 mg by mouth 2 (two) times daily.  01/20/17  Yes Baldwin Jamaica, PA-C  gabapentin (NEURONTIN) 300 MG capsule Take 600 mg by mouth 2 (two) times daily.    Yes [provider]  metFORMIN (GLUCOPHAGE) 500 MG tablet Take 500 mg by mouth daily with breakfast.    Yes [provider]  metoprolol succinate (TOPROL-XL) 50 MG 24 hr tablet Take 1 tablet (50 mg total) by mouth daily. 01/20/17  Yes Baldwin [place], PA-C  potassium chloride 20 MEQ/15ML (10%) SOLN Take 20 mEq by mouth daily as needed (if have not eaten a banana).   Yes [provider]  pyridoxine (B-6) 200 MG tablet Take 200 mg by mouth daily.   Yes [provider]  vitamin B-12 (CYANOCOBALAMIN) 500 MCG tablet Take 500 mcg by mouth daily.    Yes [provider]  warfarin (COUMADIN) 5 MG tablet Take 2.5 mg by mouth daily.   Yes [provider]  ALPRAZolam (XANAX) 0.25 MG tablet Take 1 tablet (0.25 mg total) by mouth 2 (two) times daily as needed for anxiety. 01/27/17   Hoyt Koch, MD  HYDROcodone-acetaminophen (NORCO/VICODIN) 5-325 MG tablet Take 0.5-1 tablets by mouth 2 (two) times daily as needed for moderate pain. 10/10/16   Hoyt Koch, MD  promethazine (PHENERGAN) 25 MG tablet Take 1 tablet (25 mg total) by mouth every 8 (eight) hours as needed for nausea or vomiting. 10/22/16   Hoyt Koch, MD  warfarin (COUMADIN) 5 MG tablet TAKE 1/2 TABLET EVERY DAY EXCEPT 1 TABLET ON THURSDAY Patient not taking: Reported on 03/14/2017 12/02/16   Dorothy Spark, MD    Family History Family History  Problem Relation Age of Onset  . Heart disease Mother   . Hypertension Mother   .  COPD Father   . Hypertension Father   . Hypertension Sister   . Hypertension Brother   . Hypertension Daughter   . Colon cancer Neg Hx   . Heart attack Neg Hx   . Stroke Neg Hx     Social History Social History  Substance Use Topics  . Smoking status: Former Smoker    Packs/day: 1.00    Years: 47.00    Types: Cigarettes    Quit  date: 10/26/1998  . Smokeless tobacco: Never Used  . Alcohol use Yes     Comment: 10/14/2016 "I'll have a beer q once in awhile"     Allergies   Flecainide; Morphine and related; and Cephalexin   Review of Systems Review of Systems  Constitutional: Positive for fatigue.  HENT: Negative for congestion.   Eyes: Negative for visual disturbance.  Respiratory: Positive for shortness of breath.   Cardiovascular: Negative for chest pain.  Gastrointestinal: Negative for abdominal pain, nausea and vomiting.  Genitourinary: Negative for dysuria.  Musculoskeletal: Negative for back pain.  Skin: Negative for rash.  Neurological: Positive for weakness.  Hematological: Bruises/bleeds easily.  Psychiatric/Behavioral: Negative for confusion.     Physical Exam Updated Vital Signs BP 116/75   Pulse 96   Resp 18   SpO2 96%   Physical Exam  Constitutional: She appears well-developed and well-nourished. No distress.  HENT:  Head: Normocephalic and atraumatic.  Mouth/Throat: Oropharynx is clear and moist.  Eyes: EOM are normal. Pupils are equal, round, and reactive to light.  Neck: Normal range of motion. Neck supple.  Cardiovascular: Normal rate and regular rhythm.   Pulmonary/Chest: Effort normal and breath sounds normal. No respiratory distress.  Abdominal: Soft. Bowel sounds are normal. There is no tenderness.  Musculoskeletal: Normal range of motion. She exhibits no edema.  Neurological: She is alert. No cranial nerve deficit or sensory deficit. She exhibits normal muscle tone. Coordination normal.  Skin: She is not diaphoretic.  Nursing note and  vitals reviewed.    ED Treatments / Results  Labs (all labs ordered are listed, but only abnormal results are displayed) Labs Reviewed  COMPREHENSIVE METABOLIC PANEL - Abnormal; Notable for the following:       Result Value   Chloride 98 (*)    Glucose, Bld 181 (*)    BUN 31 (*)    Creatinine, Ser 2.18 (*)    Albumin 3.4 (*)    GFR calc non Af Amer 20 (*)    GFR calc Af Amer 23 (*)    All other components within normal limits  CBC WITH DIFFERENTIAL/PLATELET - Abnormal; Notable for the following:    Hemoglobin 9.7 (*)    HCT 33.6 (*)    MCH 23.5 (*)    MCHC 28.9 (*)    Platelets 430 (*)    All other components within normal limits  PROTIME-INR - Abnormal; Notable for the following:    Prothrombin Time 27.5 (*)    All other components within normal limits  URINALYSIS, ROUTINE W REFLEX MICROSCOPIC    EKG  EKG Interpretation  Date/Time:  Friday March 14 2017 09:47:52 EDT Ventricular Rate:  111 PR Interval:    QRS Duration: 85 QT Interval:  371 QTC Calculation: 505 R Axis:   24 Text Interpretation:  Atrial flutter with predominant 2:1 AV block Prolonged QT interval Confirmed by Fredia Sorrow [phone]) on 03/14/2017 9:53:38 AM       Radiology Dg Chest Port 1 View  Result Date: 03/14/2017 CLINICAL DATA:  Dyspnea EXAM: PORTABLE CHEST 1 VIEW COMPARISON:  12/28/2016 chest radiograph. FINDINGS: Stable configuration of 2 lead left subclavian pacemaker. Pacer pad overlies the left chest. Stable cardiomediastinal silhouette with mild cardiomegaly, moderate to large hiatal hernia and aortic atherosclerosis. No pneumothorax. Trace bilateral pleural effusions. No overt pulmonary edema. Stable compressive atelectasis in the medial lower lobes. No acute consolidative airspace disease. IMPRESSION: 1. Stable cardiomegaly without overt pulmonary edema. 2. Stable moderate to large hiatal hernia with associated  compressive atelectasis in the medial lower lobes. 3. No acute pulmonary disease.  Electronically Signed   By: Ilona Sorrel M.D.   On: 03/14/2017 10:37    Procedures Procedures (including critical care time)  CRITICAL CARE Performed by: Fredia Sorrow Total critical care time: 60 minutes Critical care time was exclusive of separately billable procedures and treating other patients. Critical care was necessary to treat or prevent imminent or life-threatening deterioration. Critical care was time spent personally by me on the following activities: development of treatment plan with patient and/or surrogate as well as nursing, discussions with consultants, evaluation of patient's response to treatment, examination of patient, obtaining history from patient or surrogate, ordering and performing treatments and interventions, ordering and review of laboratory studies, ordering and review of radiographic studies, pulse oximetry and re-evaluation of patient's condition.   Medications Ordered in ED Medications  0.9 %  sodium chloride infusion ( Intravenous New Bag/Given 03/14/17 1022)  amiodarone (NEXTERONE PREMIX) 360-4.14 MG/200ML-% (1.8 mg/mL) IV infusion (not administered)  amiodarone (NEXTERONE) 1.8 mg/mL load via infusion 150 mg (150 mg Intravenous Not Given 03/14/17 1054)    Followed by  amiodarone (NEXTERONE PREMIX) 360-4.14 MG/200ML-% (1.8 mg/mL) IV infusion (60 mg/hr Intravenous New Bag/Given 03/14/17 1050)    Followed by  amiodarone (NEXTERONE PREMIX) 360-4.14 MG/200ML-% (1.8 mg/mL) IV infusion (not administered)     Initial Impression / Assessment and Plan / ED Course  I have reviewed the triage vital signs and the nursing notes.  Pertinent labs & imaging results that were available during my care of the patient were reviewed by me and considered in my medical decision making (see chart for details).    Patient brought in by EMS. She used her alert button to contact them she was feeling short of breath. Did not feel well shortly after getting up. Patient's daughter  states that she's appeared to be weak and not quite herself for the past several days. Patient denies any pain. Just the shortness of breath. EMS noted 2 runs of V. tach in route. Patient has a pacemaker. Patient is on daily amiodarone 200 mg a day. Did not take it this morning. Patient has a long history of H oral fib and is on Coumadin for that.  Upon arrival here, patient's vital signs normal but she did have 2 runs of V. tach. Did not become symptomatic when it occurred. But I suspect this is the reason why she's not feeling very good. Chest x-ray negative for any acute findings no pulmonary edema.  Basic labs without significant abnormalities other than a mild anemia with a hemoglobin of 9 something. Electrolytes without significant abnormalities of his renal insufficiency.   I do to the runs of V. tach patient was bolused with 150 mg of amiodarone and then started on the drips.  Patient's EKG did show prolonged QT. Making procainamide and sorbitol not an option for treating the V. tach. Discussed with cardiology they will see in consult. We are awaiting interrogation of her pacemaker. Patient is followed by the cardiology group here.  On the MRI around patient appears to be feeling better. Since we have started we haven't seen them run of V. tach.   Final Clinical Impressions(s) / ED Diagnoses   Final diagnoses:  Ventricular tachycardia [facility])    New Prescriptions New Prescriptions   No medications on file     Fredia Sorrow, MD 03/14/17 1113   Addendum:  Patient seen by cardiology. Pacemaker interrogated. It appears to be pacemaker malfunction where  her pacemaker is firing rapidly when it shouldn't. Cardiology is made some adjustments. They are recommending internal medicine admission. Patient is followed by [facility] primary will discuss with hospitalist.    Fredia Sorrow, MD 03/14/17 1134   Addendum #2:  Internal medicine was hesitant to admit. Patient's weakness has  been present since May. They discussed with cardiology cardiology changed her mind and felt patient did not need to be admitted based on the pacemaker malfunction which they had corrected. Although they were planning to do an echocardiogram in hospital. They said patient go home. So hospitalist in one admit at that time.  Since that time did additional workup urinalysis negative CT head negative. We get her up and stand her and try to walk her she is extremely weak. She also becomes hypoxic. On 1 L of oxygen because they're working getting her off of her oxygen she drops her sats down to 90% 89%. Will rediscuss with hospitalist feel the patient since she lives by herself clearly needs to be admitted for the hypoxia and the generalized weakness although not able to find a cause. If admitted would go ahead and recontact cardiology to do their echocardiogram.   Fredia Sorrow, MD 03/14/17 [phone]

## 2017-03-14 NOTE — Telephone Encounter (Signed)
Spoke to [place], pt's daughter and they did get a hold of her today.

## 2017-03-14 NOTE — ED Notes (Signed)
Cards and medtronic at bedside.

## 2017-03-14 NOTE — Telephone Encounter (Signed)
Sent msg to [facility] and they responded:  Waynette Buttery D          "It was out of network for [place]. I sent to an in  network provider. They state that multiple  people have left messages for patient. They are  going to do a drive by today. "    Called Ruzie and left msg for her to call back to inform her.

## 2017-03-14 NOTE — ED Triage Notes (Signed)
Pt arrives from home via [facility]. Pt states she woke up feeling fine had a banana and then had a sudden onset of sob and weakness. Pt is having runs of vtach with the last a 14 beat run. Pt appears unwell and is extremely weak.

## 2017-03-14 NOTE — H&P (Signed)
History and Physical    [name] [name] CWC:[id number] DOB: 1931/05/23 DOA: 03/14/2017  Referring MD/NP/PA: Dr. Rogene Houston PCP: Hoyt Koch, MD  Patient coming from: Home   Chief Complaint:  Not feeling well  HPI: [name] [name] is a 83 y.o. female with medical history significant of HTN, A. Fib on chronic anticoagulation, diastolic CHF, SSS s/p pacemaker, DM type II; who presents with complaints of not feeling well. At baseline the patient lives alone and is not on oxygen. Patient had been feeling very fatigued at home with even minimal activity for several weeks. She reports having several falls over the last few weeks for which she feels that her legs just give out. She was not quite sure of what was causing the symptoms. It appears the patient had been being worked up by her primary care provider for these complaints as well. Patient states that she takes all of her medications as prescribed and last saw her cardiologist in April. However, today the patient noted feeling very weak. Today she reported feeling  So weak which she thought she might pass out and felt palpitations. Denies having any leg swelling, change in weight, cough, fever, chills, dysuria, or focal weakness.   ED Course:En route with EMS patient was noted to have wide complex tachycardia. Upon admission to the emergency department patient was seen to be afebrile, heart rates up to 113, and all other vital signs maintained. Labs revealed WBC of 4.4, hemoglobin 9.7, platelets 4:30, BUN 31, creatinine 2.18, glucose 181. Patient's pacemaker was interrogated and noted to be malfunctioning for which cardiology was initially consulted and thought the patient's wife complex tachycardia was likely related to her pacemaker. They adjusted the pacemaker settings while in the emergency department. From their standpoint at that point the patient was okay for discharge. However family complained of generalized weakness. No acute signs  of infection or observed.  Patient was noted to have low O2 saturations with ambulation down to 89%. Has the patient lives alone it was recommended for observation overnight.  Review of Systems: As per HPI otherwise 10 point review of systems negative.   Past Medical History:  Diagnosis Date  . Acute gastric ulcer without mention of hemorrhage, perforation, or obstruction 2006   EGD  . Anxiety   . Atrial fibrillation (HCC)    flecainide; coumadin  . Breast cancer ([place])    left  . Carotid artery disease ([place])    76/16: RICA 0-73%, LICA 71-06%  //  [place] 26/94: RICA 8-54%; LICA 62-70% >> FU 1 year   . Chronic diastolic heart failure (HCC)    a. echo 10/10: mild LVH, EF 55-60%, mild AI, mild MR, mild to mod LAE, mild RVE, severe RAE, mod TR, PASP 53  /  b.  Echo 3/14:  Mild LVH, EF 55%, Tr AI, MAC, mild MR, mod LAE, PASP 31, trivial eff. // c. echo 1/17: EF 55-60%, mild AI, MAC, mild MR, moderate BAE, moderate TR  . Chronic lower back pain   . Diabetic peripheral neuropathy ([place]) 03/09/2015  . Diverticulosis   . Esophagitis, unspecified 2006   EGD  . Hiatal hernia 2006   EGD  . History of nuclear stress test    a. Myoview 2/12: EF 69%, no scar or ischemia  . Hx of adenomatous colonic polyps   . Hypertension   . Mediastinal lymphadenopathy   . Memory difficulty 11/14/2016  . Mitral regurgitation    mild, echo, October, 2010  . Osteopenia   .  Presence of permanent cardiac pacemaker   . Pulmonary embolus [facility]) 2006   2006, with DVT  . Pulmonary HTN ([place])    53 mmHg echo, 2010, moderate TR, mild right ventricular enlargement  . Tachy-brady syndrome ([place])    s/p pacer 07/2009  . Thyroid nodule    non-neoplastic goiter  . Type II diabetes mellitus ([place])   . Venous insufficiency    Chronic    Past Surgical History:  Procedure Laterality Date  . ABDOMINAL HYSTERECTOMY    . ANKLE FRACTURE SURGERY Right   . BACK SURGERY    . BREAST LUMPECTOMY Left   . FRACTURE SURGERY    .  INSERT / REPLACE / REMOVE PACEMAKER  06/30/2009   MDT Adalpta L implanted by Dr Olevia Perches  . LUMBAR LAMINECTOMY  1973, 06/22/2011   "no hardware either time"     reports that she quit smoking about 18 years ago. Her smoking use included Cigarettes. She has a 47.00 pack-year smoking history. She has never used smokeless tobacco. She reports that she drinks alcohol. She reports that she does not use drugs.  Allergies  Allergen Reactions  . Flecainide Shortness Of Breath and Other (See Comments)    toxicity  . Morphine And Related Other (See Comments)    Family requests no morphine due to past reaction. Pt stated it made her sick.  . Cephalexin Nausea And Vomiting    Family History  Problem Relation Age of Onset  . Heart disease Mother   . Hypertension Mother   . COPD Father   . Hypertension Father   . Hypertension Sister   . Hypertension Brother   . Hypertension Daughter   . Colon cancer Neg Hx   . Heart attack Neg Hx   . Stroke Neg Hx     Prior to Admission medications   Medication Sig Start Date End Date Taking? Authorizing Provider  ALPRAZolam (XANAX) 0.25 MG tablet Take 1 tablet (0.25 mg total) by mouth 2 (two) times daily as needed for anxiety. 01/27/17  Yes Hoyt Koch, MD  amiodarone (PACERONE) 200 MG tablet Take 1 tablet (200 mg total) by mouth daily. 11/20/16  Yes Allred, Jeneen Rinks, MD  furosemide (LASIX) 80 MG tablet Take 1 tablet (80 mg total) by mouth daily. TAKE 80 MG IN AM AND 40 MG IN PM Patient taking differently: Take 80 mg by mouth 2 (two) times daily.  01/20/17  Yes Baldwin Jamaica, PA-C  gabapentin (NEURONTIN) 300 MG capsule Take 600 mg by mouth 2 (two) times daily.    Yes [provider]  metFORMIN (GLUCOPHAGE) 500 MG tablet Take 500 mg by mouth daily with breakfast.    Yes [provider]  metoprolol succinate (TOPROL-XL) 50 MG 24 hr tablet Take 1 tablet (50 mg total) by mouth daily. 01/20/17  Yes Baldwin [place], PA-C  potassium  chloride 20 MEQ/15ML (10%) SOLN Take 20 mEq by mouth daily as needed (if have not eaten a banana).   Yes [provider]  pyridoxine (B-6) 200 MG tablet Take 200 mg by mouth daily.   Yes [provider]  vitamin B-12 (CYANOCOBALAMIN) 500 MCG tablet Take 500 mcg by mouth daily.    Yes [provider]  warfarin (COUMADIN) 5 MG tablet Take 2.5 mg by mouth daily.   Yes [provider]  HYDROcodone-acetaminophen (NORCO/VICODIN) 5-325 MG tablet Take 0.5-1 tablets by mouth 2 (two) times daily as needed for moderate pain. 10/10/16   Hoyt Koch, MD  promethazine (PHENERGAN) 25 MG tablet Take 1 tablet (25 mg total) by mouth every 8 (eight) hours as needed for nausea or vomiting. 10/22/16   Hoyt Koch, MD  warfarin (COUMADIN) 5 MG tablet TAKE 1/2 TABLET EVERY DAY EXCEPT 1 TABLET ON THURSDAY Patient not taking: Reported on 03/14/2017 12/02/16   Dorothy Spark, MD    Physical Exam: Constitutional: Elderly obese female in no acute distress  Vitals:   03/14/17 1515 03/14/17 1525 03/14/17 1530 03/14/17 1545  BP: 120/61  (!) 123/57 (!) 129/54  Pulse:   (!) 59 60  Resp: _0 Temp:      TempSrc:      SpO2:  100% 97% 95%   Eyes: PERRL, lids and conjunctivae normal ENMT: Mucous membranes are moist. Posterior pharynx clear of any exudate or lesions.  Neck: normal, supple, no masses, no thyromegaly Respiratory: clear to auscultation bilaterally, no wheezing, no crackles. Normal respiratory effort. No accessory muscle use.  Cardiovascular: irregular, no murmurs / rubs / gallops. No extremity edema. 2+ pedal pulses. No carotid bruits.  Abdomen: no tenderness, no masses palpated. No hepatosplenomegaly. Bowel sounds positive.  Musculoskeletal: no clubbing / cyanosis. No joint deformity upper and lower extremities. Good ROM, no contractures. Normal muscle tone.  Skin: no rashes, lesions, ulcers. No induration Neurologic: CN 2-12 grossly intact.  Sensation intact, DTR normal. Strength 5/5 in all 4.  Psychiatric: Normal judgment and insight. Alert and oriented x 3. Normal mood.     Labs on Admission: I have personally reviewed following labs and imaging studies  CBC:  Recent Labs Lab 03/14/17 1005  WBC 4.4  NEUTROABS 3.2  HGB 9.7*  HCT 33.6*  MCV 81.4  PLT 078*   Basic Metabolic Panel:  Recent Labs Lab 03/14/17 1005  NA 137  K 4.7  CL 98*  CO2 26  GLUCOSE 181*  BUN 31*  CREATININE 2.18*  CALCIUM 9.2   GFR: CrCl cannot be calculated (Unknown ideal weight.). Liver Function Tests:  Recent Labs Lab 03/14/17 1005  AST 27  ALT 16  ALKPHOS 125  BILITOT 0.7  PROT 7.7  ALBUMIN 3.4*   No results for input(s): LIPASE, AMYLASE in the last 168 hours. No results for input(s): AMMONIA in the last 168 hours. Coagulation Profile:  Recent Labs Lab 03/10/17 1550 03/14/17 1005  INR 4.1 2.50   Cardiac Enzymes: No results for input(s): CKTOTAL, CKMB, CKMBINDEX, TROPONINI in the last 168 hours. BNP (last 3 results) No results for input(s): PROBNP in the last 8760 hours. HbA1C: No results for input(s): HGBA1C in the last 72 hours. CBG: No results for input(s): GLUCAP in the last 168 hours. Lipid Profile: No results for input(s): CHOL, HDL, LDLCALC, TRIG, CHOLHDL, LDLDIRECT in the last 72 hours. Thyroid Function Tests: No results for input(s): TSH, T4TOTAL, FREET4, T3FREE, THYROIDAB in the last 72 hours. Anemia Panel: No results for input(s): VITAMINB12, FOLATE, FERRITIN, TIBC, IRON, RETICCTPCT in the last 72 hours. Urine analysis:    Component Value Date/Time   COLORURINE YELLOW 03/14/2017 1320   APPEARANCEUR CLEAR 03/14/2017 1320   APPEARANCEUR Clear 01/06/2017 1601   LABSPEC 1.011 03/14/2017 1320   LABSPEC 1.008 11/30/2011 0748   PHURINE 7.0 03/14/2017 1320   GLUCOSEU NEGATIVE 03/14/2017 1320   GLUCOSEU NEGATIVE 10/22/2016 1628   HGBUR NEGATIVE 03/14/2017 1320   BILIRUBINUR NEGATIVE 03/14/2017 1320     BILIRUBINUR Negative 01/06/2017 1601   BILIRUBINUR Negative 11/30/2011 0748   KETONESUR NEGATIVE 03/14/2017 1320   PROTEINUR  NEGATIVE 03/14/2017 1320   UROBILINOGEN 0.2 10/22/2016 1628   NITRITE NEGATIVE 03/14/2017 1320   LEUKOCYTESUR NEGATIVE 03/14/2017 1320   LEUKOCYTESUR Negative 01/06/2017 1601   LEUKOCYTESUR 3+ 11/30/2011 0748   Sepsis Labs: No results found for this or any previous visit (from the past 240 hour(s)).   Radiological Exams on Admission: Ct Head Wo Contrast  Result Date: 03/14/2017 CLINICAL DATA:  Weakness EXAM: CT HEAD WITHOUT CONTRAST TECHNIQUE: Contiguous axial images were obtained from the base of the skull through the vertex without intravenous contrast. COMPARISON:  CT head 10/14/2016 FINDINGS: Brain: Moderate chronic microvascular ischemic change. Mild atrophy. Negative for acute infarct, hemorrhage, or mass lesion. Vascular: Negative for hyperdense vessel. Skull: Negative Sinuses/Orbits: Negative Other: None IMPRESSION: Atrophy and chronic microvascular ischemia.  No acute abnormality. Electronically Signed   By: Franchot Gallo M.D.   On: 03/14/2017 14:35   Dg Chest Port 1 View  Result Date: 03/14/2017 CLINICAL DATA:  Dyspnea EXAM: PORTABLE CHEST 1 VIEW COMPARISON:  12/28/2016 chest radiograph. FINDINGS: Stable configuration of 2 lead left subclavian pacemaker. Pacer pad overlies the left chest. Stable cardiomediastinal silhouette with mild cardiomegaly, moderate to large hiatal hernia and aortic atherosclerosis. No pneumothorax. Trace bilateral pleural effusions. No overt pulmonary edema. Stable compressive atelectasis in the medial lower lobes. No acute consolidative airspace disease. IMPRESSION: 1. Stable cardiomegaly without overt pulmonary edema. 2. Stable moderate to large hiatal hernia with associated compressive atelectasis in the medial lower lobes. 3. No acute pulmonary disease. Electronically Signed   By: Ilona Sorrel M.D.   On: 03/14/2017 10:37    EKG:  Independently reviewed.   Assessment/Plan Pacemaker complication: Resolved. Patient was having multiple runs of wide-complex tachycardia that appeared to have been induced by the pacemaker. Cardiology evaluated in the emergency department reprogrammed the patient's pacemaker. - Per cardiology - Check echocardiogram  Dyspnea: Acute. Likely related to pacemaker malfunctioning although no clear signs of overt pulmonary edema noted. - Check BNP - DuoNeb  Generalized weakness - Physical therapy to eval and treat  Paroxysmal atrial fibrillation: Currently rate controlled and INR therapeutic at 2.5. Chadsvasc =5  - Continue amiodarone, metoprolol - Warfarin per pharmacy   Diastolic CHF: last EF noted to be 55-60% and 10/2015. - Monitor ins and outs - Daily weights  Diabetes mellitus type - hypoglycemic protocol - Hold metformin - CBGs every before meals and at bedtime since his last insulin  Chronic kidney disease stage IV: Stable. - Continue to monitor  DVT prophylaxis: Warfarin Code Status: full Family Communication: Discussed care with the patient apparently present at bedside  Disposition Plan: TBD Consults called: Cardiology  Admission status: Observation  Norval Morton MD [facility] Pager [phone]  If 7PM-7AM, please contact night-coverage [email or web address] Password [facility]  03/14/2017, 4:12 PM

## 2017-03-14 NOTE — ED Notes (Addendum)
This nurse assisted pt standing with help of Josh, NT to assess weakness. Pt with some difficulty standing, reports she "feels very woozy". Pt family member states "she is not herself" and reports pt typically able to walk with a very steady gait with her walker. Pt with unsteady gait and generalized weakness, able to walk to the door before requesting to get back in bed because she "feels unable to do it". Dr. Rogene Houston aware. Pt O2 sats decreased to approx 90-93% while attempting ambulate. Per pt and family, pt does not use at home O2.

## 2017-03-14 NOTE — ED Notes (Signed)
Daughter, [name], [phone]

## 2017-03-14 NOTE — ED Notes (Signed)
Darci Current, RN interrogating pacer. Neither device will pick up pacemaker. Will call [employer].

## 2017-03-14 NOTE — Progress Notes (Signed)
Patient arrived on the unit from the ER,assesment completed see flowsheet,placed on tele ccmd notified,patient oriented to room and staff,bed in lowest position,call bell within reach will continue to monitor.

## 2017-03-14 NOTE — ED Notes (Signed)
Pt O2 saturation 100% on 2L , pt O2 decreased to 1 L.

## 2017-03-14 NOTE — ED Notes (Signed)
X RAY at bedside 

## 2017-03-14 NOTE — ED Notes (Signed)
Pt placed on 2L [place], pt sats in the low 90s.

## 2017-03-14 NOTE — Consult Note (Signed)
Cardiology Consultation:   Patient ID: [name] [name]; [id number]; 1930-12-08   Admit date: 03/14/2017 Date of Consult: 03/14/2017  Primary Care Provider: Hoyt Koch, MD Primary Cardiologist/Electrophysiologist:  Dr. Rayann Heman   Patient Profile:   [name] [name] is a 83 y.o. female with a hx of  SSSx w/PPM, anxiety, AFib, chronic CHF (diastolic), CBP, DM, remote hx of DVT, HTN, chronic venous insufficiency who is being seen today for the evaluation of wide complex rhythm at the request of [place].   History of Present Illness:   Ms. Youkhana comes to the ER with complaints of progressive generalized fatigue, has had a couple falls in recent weeks that she says her legs just give out, doesn't feel like they have the strength to hold her up.  She denies laterality.  No CO or palpitations, initially said she was feeling SOB, though in discussion she says her breathing and swelling have been the best it has been in months. Her daughter accompanies her and agrees.  The patient denies any trauma, said the other day she was making her bed and her legs just gave out and she fell to her knees.  She is certain that this was not near syncope and denies blacking out.  Today though she did feel like she was so weak she did feel a component of also feeling lightheaded called her daughter who told her to call EMS.  During transit and in the ED she was observed to have non-sustained wide complex rhythm suspect to be VT and EP was called to evaluate.  In discussion with the patient/daughter and review of record, it appears she has had ongoing generalized functional decline for a few months, the daughter the generalized weakness appears to be somewhat chronic but worsening, the daughter has been trying to get home PT arranged via her PMD but so far unsuccessful.  The patient does not describe episodic symptoms.  With interrogation of her PPM with industry/Dr. Lennette Fader, were able to reproduce, feel  the wide complex rhythm observed was pacing bahvior from rate response.  This was programmed off, to DDI programming.    Past Medical History:  Diagnosis Date  . Acute gastric ulcer without mention of hemorrhage, perforation, or obstruction 2006   EGD  . Anxiety   . Atrial fibrillation (HCC)    flecainide; coumadin  . Breast cancer ([place])    left  . Carotid artery disease ([place])    01/09: RICA 3-23%, LICA 55-73%  //  [place] 22/02: RICA 5-42%; LICA 70-62% >> FU 1 year   . Chronic diastolic heart failure (HCC)    a. echo 10/10: mild LVH, EF 55-60%, mild AI, mild MR, mild to mod LAE, mild RVE, severe RAE, mod TR, PASP 53  /  b.  Echo 3/14:  Mild LVH, EF 55%, Tr AI, MAC, mild MR, mod LAE, PASP 31, trivial eff. // c. echo 1/17: EF 55-60%, mild AI, MAC, mild MR, moderate BAE, moderate TR  . Chronic lower back pain   . Diabetic peripheral neuropathy ([place]) 03/09/2015  . Diverticulosis   . Esophagitis, unspecified 2006   EGD  . Hiatal hernia 2006   EGD  . History of nuclear stress test    a. Myoview 2/12: EF 69%, no scar or ischemia  . Hx of adenomatous colonic polyps   . Hypertension   . Mediastinal lymphadenopathy   . Memory difficulty 11/14/2016  . Mitral regurgitation    mild, echo, October, 2010  . Osteopenia   .  Presence of permanent cardiac pacemaker   . Pulmonary embolus [facility]) 2006   2006, with DVT  . Pulmonary HTN ([place])    53 mmHg echo, 2010, moderate TR, mild right ventricular enlargement  . Tachy-brady syndrome ([place])    s/p pacer 07/2009  . Thyroid nodule    non-neoplastic goiter  . Type II diabetes mellitus ([place])   . Venous insufficiency    Chronic    Past Surgical History:  Procedure Laterality Date  . ABDOMINAL HYSTERECTOMY    . ANKLE FRACTURE SURGERY Right   . BACK SURGERY    . BREAST LUMPECTOMY Left   . FRACTURE SURGERY    . INSERT / REPLACE / REMOVE PACEMAKER  06/30/2009   MDT Adalpta L implanted by Dr Olevia Perches  . [place], 06/22/2011    "no hardware either time"     Inpatient Medications: Scheduled Meds: . amiodarone  150 mg Intravenous Once   Continuous Infusions: . sodium chloride 75 mL/hr at 03/14/17 1022  . amiodarone    . amiodarone 60 mg/hr (03/14/17 1050)   Followed by  . amiodarone     PRN Meds:   Allergies:    Allergies  Allergen Reactions  . Flecainide Shortness Of Breath and Other (See Comments)    toxicity  . Morphine And Related Other (See Comments)    Family requests no morphine due to past reaction. Pt stated it made her sick.  . Cephalexin Nausea And Vomiting    Social History:   Social History   Social History  . Marital status: Widowed    Spouse name: N/A  . Number of children: 3  . Years of education: N/A   Occupational History  . Retired     Social History Main Topics  . Smoking status: Former Smoker    Packs/day: 1.00    Years: 47.00    Types: Cigarettes    Quit date: 10/26/1998  . Smokeless tobacco: Never Used  . Alcohol use Yes     Comment: 10/14/2016 "I'll have a beer q once in awhile"  . Drug use: No  . Sexual activity: No   Other Topics Concern  . Not on file   Social History Narrative   Widowed.  Lives in [place] by herself.  Avoids caffeine.  Very active, taking care of her brother who also lives by himself and is in his late 59's.    Family History:   The patient's family history includes COPD in her father; Heart disease in her mother; Hypertension in her brother, daughter, father, mother, and sister. There is no history of Colon cancer, Heart attack, or Stroke.  ROS:  Please see the history of present illness.  ROS  All other ROS reviewed and negative.     Physical Exam/Data:   Vitals:   03/14/17 0953 03/14/17 1000 03/14/17 1030 03/14/17 1045  BP:  118/76 111/86 116/75  Pulse:  (!) 113 86 96  Resp:  _0 SpO2: 94% 93% 98% 96%    General:  Well nourished, well developed, in no acute distress HEENT: normal Lymph: no adenopathy Neck: no  JVD Endocrine:  No thryomegaly Vascular: No carotid bruits; FA pulses 2+ bilaterally without bruits  Cardiac:  IRRR; 1/6SM, no gallops or runs are appreciated Lungs:  CTA b/l, no wheezing, rhonchi or rales  Abd: soft, nontender, no hepatomegaly  Ext: no edema Musculoskeletal:  No deformities Skin: warm and dry  Neuro:  No gross focal abnormalities noted Psych:  Normal  affect   Reviewed by myself: EKG:  Is AFib 110bpm, no ischemic looking changes Telemetry is AFib, 80's-90's, intermittent pacing.  Nonsustained wide complex rhythm longest 14 beats, rates 80-100  Relevant CV Studies: 04/13/16: TTE Study Conclusions - Left ventricle: The cavity size was normal. There was mild   concentric hypertrophy. Systolic function was normal. The   estimated ejection fraction was in the range of 55% to 60%. Wall   motion was normal; there were no regional wall motion   abnormalities. Doppler parameters are consistent with restrictive   physiology, indicative of decreased left ventricular diastolic   compliance and/or increased left atrial pressure. Doppler   parameters are consistent with elevated ventricular end-diastolic   filling pressure. - Aortic valve: There was mild regurgitation. - Aortic root: The aortic root was normal in size. - Ascending aorta: The ascending aorta was normal in size. - Mitral valve: Calcified annulus. Mildly thickened leaflets .   There was mild regurgitation. - Left atrium: The atrium was moderately dilated. - Right ventricle: Pacer wire or catheter noted in right ventricle. - Right atrium: Pacer wire or catheter noted in right atrium. - Tricuspid valve: There was moderate regurgitation. - Pulmonic valve: There was no regurgitation. - Pulmonary arteries: Systolic pressure was mildly increased. PA   peak pressure: 38 mm Hg (S). - Inferior vena cava: The vessel was normal in size. - Pericardium, extracardiac: There was no pericardial effusion.   Laboratory  Data:  Chemistry Recent Labs Lab 03/14/17 1005  NA 137  K 4.7  CL 98*  CO2 26  GLUCOSE 181*  BUN 31*  CREATININE 2.18*  CALCIUM 9.2  GFRNONAA 20*  GFRAA 23*  ANIONGAP 13     Recent Labs Lab 03/14/17 1005  PROT 7.7  ALBUMIN 3.4*  AST 27  ALT 16  ALKPHOS 125  BILITOT 0.7   Hematology Recent Labs Lab 03/14/17 1005  WBC 4.4  RBC 4.13  HGB 9.7*  HCT 33.6*  MCV 81.4  MCH 23.5*  MCHC 28.9*  RDW 15.4  PLT 430*    Radiology/Studies:  Dg Chest Port 1 View Result Date: 03/14/2017 CLINICAL DATA:  Dyspnea EXAM: PORTABLE CHEST 1 VIEW COMPARISON:  12/28/2016 chest radiograph. FINDINGS: Stable configuration of 2 lead left subclavian pacemaker. Pacer pad overlies the left chest. Stable cardiomediastinal silhouette with mild cardiomegaly, moderate to large hiatal hernia and aortic atherosclerosis. No pneumothorax. Trace bilateral pleural effusions. No overt pulmonary edema. Stable compressive atelectasis in the medial lower lobes. No acute consolidative airspace disease. IMPRESSION: 1. Stable cardiomegaly without overt pulmonary edema. 2. Stable moderate to large hiatal hernia with associated compressive atelectasis in the medial lower lobes. 3. No acute pulmonary disease. Electronically Signed   By: Ilona Sorrel M.D.   On: 03/14/2017 10:37    Assessment and Plan:   1. Wide complex rhythm     Pacing behavior, not felt to have been VT     Pacer is functioning appropriately, reprogrammed as discussed  2. Paroxysmal Afib     INR today is 2.5, CHA2DS2Vasc is 5 on Warfain     Rate here is controlled  3. Chronic CHF     Exam xray does not suggest fluid OL  4. Progressive generalized weakness     Discussed with ER MD,  if felt need for admission due to this would have medicine admit to evaluate further.     Nikolaos Maddocks update her echo        Signed, Baldwin [place], PA-C  03/14/2017  11:40 AM   I have seen and examined this patient with Tommye Standard.  Agree with above, note  added to reflect my findings.  On exam, iRRR, no murmurs, lungs clear. She was found to have wide complex tachycardia on the monitor. The wide complexes were regular. She does have a history of atrial fibrillation and is in atrial fibrillation currently. She also has a pacemaker. During her episodes of wide complex tachycardia, the QRS morphology is the same as her paced morphology. It is possible that this is all due to rate response due to her pacemaker. Due to .   that, we have turned rate response off on her pacemaker. We have also changed her maximum tracking rate to 120 bpm, and adjusted so that she Ryanna Teschner sense ventricular arrhythmias at a rate of 130 bpm.  Tamantha Saline M. Gianluca Chhim MD 03/14/2017 2:47 PM

## 2017-03-15 ENCOUNTER — Observation Stay (HOSPITAL_BASED_OUTPATIENT_CLINIC_OR_DEPARTMENT_OTHER): Payer: Medicare HMO

## 2017-03-15 DIAGNOSIS — N184 Chronic kidney disease, stage 4 (severe): Secondary | ICD-10-CM | POA: Diagnosis not present

## 2017-03-15 DIAGNOSIS — I360 Nonrheumatic tricuspid (valve) stenosis: Secondary | ICD-10-CM

## 2017-03-15 DIAGNOSIS — E119 Type 2 diabetes mellitus without complications: Secondary | ICD-10-CM | POA: Diagnosis not present

## 2017-03-15 DIAGNOSIS — I5032 Chronic diastolic (congestive) heart failure: Secondary | ICD-10-CM | POA: Diagnosis not present

## 2017-03-15 DIAGNOSIS — I10 Essential (primary) hypertension: Secondary | ICD-10-CM | POA: Diagnosis not present

## 2017-03-15 DIAGNOSIS — I480 Paroxysmal atrial fibrillation: Secondary | ICD-10-CM | POA: Diagnosis not present

## 2017-03-15 DIAGNOSIS — R0602 Shortness of breath: Secondary | ICD-10-CM | POA: Diagnosis not present

## 2017-03-15 DIAGNOSIS — T829XXA Unspecified complication of cardiac and vascular prosthetic device, implant and graft, initial encounter: Secondary | ICD-10-CM

## 2017-03-15 LAB — ECHOCARDIOGRAM COMPLETE
AO mean calculated velocity dopler: 117 cm/s
AOPV: 0.7 m/s
AOVTI: 37.9 cm
AV Area VTI: 2.44 cm2
AV Area mean vel: 2.14 cm2
AV Peak grad: 11 mmHg
AV VEL mean LVOT/AV: 0.62
AV area mean vel ind: 0.9 cm2/m2
AV peak Index: 1.03
AV vel: 2.22
AVA: 2.22 cm2
AVAREAVTIIND: 0.94 cm2/m2
AVG: 7 mmHg
AVPKVEL: 166 cm/s
Ao-asc: 36 cm
CHL CUP LVOT MV VTI INDEX: 0.87 cm2/m2
CHL CUP LVOT MV VTI: 2.06
CHL CUP MV M VEL: 62.1
CHL CUP PV REG GRAD DIAS: 7 mmHg
CHL CUP REG VEL DIAS: 130 cm/s
CHL CUP RV SYS PRESS: 72 mmHg
E decel time: 225 msec
FS: 34 % (ref 28–44)
Height: 72 in
IVS/LV PW RATIO, ED: 1.11
LA ID, A-P, ES: 40 mm
LA vol: 93.5 mL
LADIAMINDEX: 1.69 cm/m2
LAVOLA4C: 86.7 mL
LAVOLIN: 39.4 mL/m2
LDCA: 3.46 cm2
LEFT ATRIUM END SYS DIAM: 40 mm
LVOT SV: 84 mL
LVOT VTI: 24.3 cm
LVOT diameter: 21 mm
LVOT peak VTI: 0.64 cm
LVOT peak grad rest: 5 mmHg
LVOTPV: 117 cm/s
Lateral S' vel: 16.1 cm/s
MRPISAEROA: 0.13 cm2
MV Annulus VTI: 40.8 cm
MV Dec: 225
MV Peak grad: 5 mmHg
MV pk A vel: 77.1 m/s
MV pk E vel: 117 m/s
MVAP: 2.82 cm2
Mean grad: 2 mmHg
P 1/2 time: 871 ms
P 1/2 time: 89 ms
PW: 12.6 mm — AB (ref 0.6–1.1)
Reg peak vel: 379 cm/s
TAPSE: 20.8 mm
TRMAXVEL: 379 cm/s
VTI: 139 cm
Valve area index: 0.94
Weight: 3801.6 oz

## 2017-03-15 LAB — BASIC METABOLIC PANEL
ANION GAP: 10 (ref 5–15)
BUN: 32 mg/dL — ABNORMAL HIGH (ref 6–20)
CALCIUM: 8.7 mg/dL — AB (ref 8.9–10.3)
CHLORIDE: 101 mmol/L (ref 101–111)
CO2: 28 mmol/L (ref 22–32)
Creatinine, Ser: 2.02 mg/dL — ABNORMAL HIGH (ref 0.44–1.00)
GFR calc non Af Amer: 21 mL/min — ABNORMAL LOW (ref >60)
GFR, EST AFRICAN AMERICAN: 25 mL/min — AB (ref >60)
Glucose, Bld: 125 mg/dL — ABNORMAL HIGH (ref 65–99)
Potassium: 3.8 mmol/L (ref 3.5–5.1)
Sodium: 139 mmol/L (ref 135–145)

## 2017-03-15 LAB — CBC
HEMATOCRIT: 28.9 % — AB (ref 36.0–46.0)
Hemoglobin: 8.6 g/dL — ABNORMAL LOW (ref 12.0–15.0)
MCH: 23.9 pg — ABNORMAL LOW (ref 26.0–34.0)
MCHC: 29.8 g/dL — ABNORMAL LOW (ref 30.0–36.0)
MCV: 80.3 fL (ref 78.0–100.0)
Platelets: 390 10*3/uL (ref 150–400)
RBC: 3.6 MIL/uL — ABNORMAL LOW (ref 3.87–5.11)
RDW: 15.3 % (ref 11.5–15.5)
WBC: 4.3 10*3/uL (ref 4.0–10.5)

## 2017-03-15 LAB — PROTIME-INR
INR: 2.35
PROTHROMBIN TIME: 26.1 s — AB (ref 11.4–15.2)

## 2017-03-15 LAB — GLUCOSE, CAPILLARY
GLUCOSE-CAPILLARY: 127 mg/dL — AB (ref 65–99)
Glucose-Capillary: 147 mg/dL — ABNORMAL HIGH (ref 65–99)

## 2017-03-15 NOTE — Care Management Note (Signed)
Case Management Note  Patient Details  Name: [name] [name] MRN: [id number] Date of Birth: 01/15/1931  Subjective/Objective:                  Not feeling well Action/Plan: Discharge planning Expected Discharge Date:  03/15/17               Expected Discharge Plan:  [place]  In-House Referral:     Discharge planning Services  CM Consult  Post Acute Care Choice:  Home Health Choice offered to:  Patient  DME Arranged:  N/A DME Agency:  NA  HH Arranged:  PT [place] Agency:  [facility]  Status of Service:  Completed, signed off  If discussed at [place] of Stay Meetings, dates discussed:    Additional Comments: Cm spoke with pt for choice of home health agency to render HHPT. Pt chooses [facility] to render service.  Referral called to [facility] rep, Jermaine.  Pt states she has both a rolling walker and a 3n1 at home. No other Cm needs were communicated. Dellie Catholic, RN 03/15/2017, 2:11 PM

## 2017-03-15 NOTE — Progress Notes (Signed)
Patient in a stable condition, discharge education reviewed with patient and her daughter at bedside, they verbalized understanding, patient belongings at bedside, iv removed, foley dc, tele dc ccmd notified, patient's daughter to transport patient home.

## 2017-03-15 NOTE — Evaluation (Signed)
Physical Therapy Evaluation Patient Details Name: [name] [name] MRN: [id number] DOB: [birth year] Today's Date: 03/15/2017   History of Present Illness  Patient is a 83 y/o female who presents with complaints of not feeling well and weakness. Found to have wide complex tachycardia and pacemaker malfunction. PMH includes pacemaker, A-fib, chronic CHF, HTN, DM, CKD, peripheral neuropathy, CAD, PE.  Clinical Impression  Patient presents with lightheadedness/dizziness (does not describe these symptoms well), impaired balance and mobility s/p above. Tolerated gait training with min A for balance/safety. VSS. Anticipate Sp02 might have been in high 80s during mobility but when checked 90% on RA. Pt with LOB into chair due to dizziness. Pt home alone and high fall risk at this time. Concerned about her going home without assist/supervision. Recommend 24./7 supervision initially for safety. Will follow acutely to maximize independence and mobility prior to return home.     Follow Up Recommendations Home health PT;Supervision for mobility/OOB;Supervision/Assistance - 24 hour    Equipment Recommendations  None recommended by PT    Recommendations for Other Services OT consult     Precautions / Restrictions Precautions Precautions: Fall Precaution Comments: lightheadedness Restrictions Weight Bearing Restrictions: No      Mobility  Bed Mobility Overal bed mobility: Needs Assistance Bed Mobility: Supine to Sit     Supine to sit: Min guard;HOB elevated     General bed mobility comments: No assist needed, use of rail for support. No dizziness.  Transfers Overall transfer level: Needs assistance Equipment used: Rolling walker (2 wheeled) Transfers: Sit to/from Stand Sit to Stand: Min guard         General transfer comment: Min guard for safety. Slow to stand from EOB x2, transferred to chair post ambulation. LOB into chair due to dizziness.   Ambulation/Gait Ambulation/Gait  assistance: Min guard;Min assist Ambulation Distance (Feet): 75 Feet Assistive device: Rolling walker (2 wheeled) Gait Pattern/deviations: Step-through pattern;Decreased stride length;Drifts right/left Gait velocity: decreased   General Gait Details: Slow, unsteady gait due to complaints of feeling funny and dizziness. BP 125/57, Sp02 90% on RA.   Stairs            Wheelchair Mobility    Modified Rankin (Stroke Patients Only)       Balance Overall balance assessment: Needs assistance;History of Falls Sitting-balance support: Feet supported;No upper extremity supported Sitting balance-Leahy Scale: Fair     Standing balance support: During functional activity;Bilateral upper extremity supported Standing balance-Leahy Scale: Poor Standing balance comment: Reliant on BUes for support in standnig. Dizziness present.                             Pertinent Vitals/Pain Pain Assessment: No/denies pain    Home Living Family/patient expects to be discharged to:: Private residence Living Arrangements: Alone Available Help at Discharge: Family (daughter checks on her) Type of Home: House Home Access: Level entry     Home Layout: One level Home Equipment: Environmental consultant - 4 wheels;Walker - 2 wheels;Cane - single point      Prior Function Level of Independence: Independent with assistive device(s)         Comments: Furniture walker at home vs using RW. Does cooking/cleaning.  Daughterassists with grcoery shopping. Reports falls recently.      Hand Dominance   Dominant Hand: Right    Extremity/Trunk Assessment   Upper Extremity Assessment Upper Extremity Assessment: Defer to OT evaluation    Lower Extremity Assessment Lower Extremity Assessment: Generalized weakness  Communication   Communication: No difficulties  Cognition Arousal/Alertness: Awake/alert Behavior During Therapy: WFL for tasks assessed/performed Overall Cognitive Status: Within  Functional Limits for tasks assessed                                        General Comments General comments (skin integrity, edema, etc.): VSS.    Exercises     Assessment/Plan    PT Assessment Patient needs continued PT services  PT Problem List Decreased strength;Decreased mobility;Decreased balance;Cardiopulmonary status limiting activity;Decreased activity tolerance       PT Treatment Interventions Therapeutic activities;Gait training;Therapeutic exercise;Patient/family education;Balance training;Functional mobility training    PT Goals (Current goals can be found in the Care Plan section)  Acute Rehab PT Goals Patient Stated Goal: to be independent PT Goal Formulation: With patient Time For Goal Achievement: 03/29/17 Potential to Achieve Goals: Fair    Frequency Min 3X/week   Barriers to discharge Decreased caregiver support lives alone    Co-evaluation               AM-PAC PT "6 Clicks" Daily Activity  Outcome Measure Difficulty turning over in bed (including adjusting bedclothes, sheets and blankets)?: None Difficulty moving from lying on back to sitting on the side of the bed? : None Difficulty sitting down on and standing up from a chair with arms (e.g., wheelchair, bedside commode, etc,.)?: None Help needed moving to and from a bed to chair (including a wheelchair)?: A Little Help needed walking in hospital room?: A Little Help needed climbing 3-5 steps with a railing? : A Lot 6 Click Score: 20    End of Session Equipment Utilized During Treatment: Gait belt Activity Tolerance: Treatment limited secondary to medical complications (Comment) (dizziness, LOB) Patient left: in chair;with call bell/phone within reach Nurse Communication: Mobility status PT Visit Diagnosis: Unsteadiness on feet (R26.81);Dizziness and giddiness (R42)    Time: 1103-1594 PT Time Calculation (min) (ACUTE ONLY): 24 min   Charges:   PT Evaluation $PT Eval  Low Complexity: 1 Procedure PT Treatments $Gait Training: 8-22 mins   PT G Codes:   PT G-Codes **NOT FOR INPATIENT CLASS** Functional Assessment Tool Used: Clinical judgement Functional Limitation: Mobility: Walking and moving around Mobility: Walking and Moving Around Current Status (V8592): At least 20 percent but less than 40 percent impaired, limited or restricted Mobility: Walking and Moving Around Goal Status [phone]): At least 1 percent but less than 20 percent impaired, limited or restricted    [place], [place], [place] [phone]    Lacie Draft 03/15/2017, 10:07 AM

## 2017-03-15 NOTE — Discharge Instructions (Signed)
[name] [name],  You were observed overnight because of an issue with your pacemaker. Cardiology saw you and adjusted your pacemaker. Please follow-up with your cardiologist as an outpatient. It was a pleasure meeting you.  Cordelia Poche, MD [facility]

## 2017-03-15 NOTE — Progress Notes (Signed)
  Echocardiogram 2D Echocardiogram has been performed.  [name] 03/15/2017, 3:36 PM

## 2017-03-15 NOTE — Discharge Summary (Signed)
Physician Discharge Summary  [name] [name] HUT:[id number] DOB: 02/08/1931 DOA: 03/14/2017  PCP: Hoyt Koch, MD  Admit date: 03/14/2017 Discharge date: 03/15/2017  Admitted From: Home Disposition: Home  Recommendations for Outpatient Follow-up:  1. Follow up with PCP in 1 week 2. Please obtain BMP/CBC in one week 3. Follow up with cardiology  Home Health: Physical therapy Equipment/Devices: None  Discharge Condition: Stable CODE STATUS: Full code Diet recommendation: Heart healthy   Brief/Interim Summary:  Admission HPI written by Norval Morton, MD   Chief Complaint:  Not feeling well  HPI: [name] [name] is a 83 y.o. female with medical history significant of HTN, A. Fib on chronic anticoagulation, diastolic CHF, SSS s/p pacemaker, DM type II; who presents with complaints of not feeling well. At baseline the patient lives alone and is not on oxygen. Patient had been feeling very fatigued at home with even minimal activity for several weeks. She reports having several falls over the last few weeks for which she feels that her legs just give out. She was not quite sure of what was causing the symptoms. It appears the patient had been being worked up by her primary care provider for these complaints as well. Patient states that she takes all of her medications as prescribed and last saw her cardiologist in April. However, today the patient noted feeling very weak. Today she reported feeling  So weak which she thought she might pass out and felt palpitations. Denies having any leg swelling, change in weight, cough, fever, chills, dysuria, or focal weakness.   ED Course:En route with EMS patient was noted to have wide complex tachycardia. Upon admission to the emergency department patient was seen to be afebrile, heart rates up to 113, and all other vital signs maintained. Labs revealed WBC of 4.4, hemoglobin 9.7, platelets 4:30, BUN 31, creatinine 2.18, glucose 181.  Patient's pacemaker was interrogated and noted to be malfunctioning for which cardiology was initially consulted and thought the patient's wife complex tachycardia was likely related to her pacemaker. They adjusted the pacemaker settings while in the emergency department. From their standpoint at that point the patient was okay for discharge. However family complained of generalized weakness. No acute signs of infection or observed.  Patient was noted to have low O2 saturations with ambulation down to 89%. Has the patient lives alone it was recommended for observation overnight.    Hospital course:  Pacemaker complication Patient Examined with multiple runs of wide-complex tachycardia secondary to pacemaker. Cardiology reprogrammed the pacemaker in the emergency department. Echocardiogram was obtained and results below. Cardiology evaluation the morning and cleared for discharge with follow-up.  Dyspnea Present on admission and improved after pacemaker was reprogrammed.  Generalized weakness Physical therapy evaluated recommended home health physical therapy.  Paroxysmal atrial fibrillation Rate controlled.CHA2DS2-VASc Score is 5. Continued amiodarone and metoprolol. Continue warfarin.  Chronic diastolic heart failure Euvolemic on exam. No exacerbation. EF obtained on June 1618 significant for an EF of 60-65% with grade 2 diastolic dysfunction. Also significant for. Peak pressures of 61 mmHg.  Diabetes mellitus, not insulin-dependent Help metformin on admission. Sliding scale insulin while in the hospital. Resumed metformin at discharge  CKD stage IV Baseline creatinine of about 1.8. 2.18 on admission down to 2.02 before discharge.  Discharge Diagnoses:  Principal Problem:   Pacemaker complications Active Problems:   Hypertension   Chronic diastolic CHF (congestive heart failure) ([place])   Dyspnea   Diabetes mellitus type 2, noninsulin dependent (HCC)   A-fib (HCC)  CKD (chronic  kidney disease), stage IV [facility])    Discharge Instructions  Discharge Instructions    Call MD for:  difficulty breathing, headache or visual disturbances    Complete by:  As directed    Call MD for:  persistant dizziness or light-headedness    Complete by:  As directed    Call MD for:  persistant nausea and vomiting    Complete by:  As directed    Call MD for:  severe uncontrolled pain    Complete by:  As directed    Diet - low sodium heart healthy    Complete by:  As directed    Increase activity slowly    Complete by:  As directed      Allergies as of 03/15/2017      Reactions   Flecainide Shortness Of Breath, Other (See Comments)   toxicity   Morphine And Related Other (See Comments)   Family requests no morphine due to past reaction. Pt stated it made her sick.   Cephalexin Nausea And Vomiting      Medication List    TAKE these medications   ALPRAZolam 0.25 MG tablet Commonly known as:  XANAX Take 1 tablet (0.25 mg total) by mouth 2 (two) times daily as needed for anxiety.   amiodarone 200 MG tablet Commonly known as:  PACERONE Take 1 tablet (200 mg total) by mouth daily.   furosemide 80 MG tablet Commonly known as:  LASIX Take 1 tablet (80 mg total) by mouth daily. TAKE 80 MG IN AM AND 40 MG IN PM What changed:  when to take this  additional instructions   gabapentin 300 MG capsule Commonly known as:  NEURONTIN Take 600 mg by mouth 2 (two) times daily.   HYDROcodone-acetaminophen 5-325 MG tablet Commonly known as:  NORCO/VICODIN Take 0.5-1 tablets by mouth 2 (two) times daily as needed for moderate pain.   metFORMIN 500 MG tablet Commonly known as:  GLUCOPHAGE Take 500 mg by mouth daily with breakfast.   metoprolol succinate 50 MG 24 hr tablet Commonly known as:  TOPROL-XL Take 1 tablet (50 mg total) by mouth daily.   potassium chloride 20 MEQ/15ML (10%) Soln Take 20 mEq by mouth daily as needed (if have not eaten a banana).   promethazine 25 MG  tablet Commonly known as:  PHENERGAN Take 1 tablet (25 mg total) by mouth every 8 (eight) hours as needed for nausea or vomiting.   pyridoxine 200 MG tablet Commonly known as:  B-6 Take 200 mg by mouth daily.   vitamin B-12 500 MCG tablet Commonly known as:  CYANOCOBALAMIN Take 500 mcg by mouth daily.   warfarin 5 MG tablet Commonly known as:  COUMADIN Take 2.5 mg by mouth daily. What changed:  Another medication with the same name was removed. Continue taking this medication, and follow the directions you see here.      Follow-up Information    [facility] Follow up on 03/27/2017.   Specialty:  Cardiology Why:  1:00PM, echorcardiogram (heart ultrasound)  Contact information: [address], [address] [place]       Baldwin Jamaica, PA-C Follow up on 04/04/2017.   Specialty:  Cardiology Why:  1:00PM Contact information: [address] [place] [address] [phone]          Allergies  Allergen Reactions  . Flecainide Shortness Of Breath and Other (See Comments)    toxicity  .  Morphine And Related Other (See Comments)    Family requests no morphine due to past reaction. Pt stated it made her sick.  . Cephalexin Nausea And Vomiting    Consultations:  Cardiology   Procedures/Studies: Ct Head Wo Contrast  Result Date: 03/14/2017 CLINICAL DATA:  Weakness EXAM: CT HEAD WITHOUT CONTRAST TECHNIQUE: Contiguous axial images were obtained from the base of the skull through the vertex without intravenous contrast. COMPARISON:  CT head 10/14/2016 FINDINGS: Brain: Moderate chronic microvascular ischemic change. Mild atrophy. Negative for acute infarct, hemorrhage, or mass lesion. Vascular: Negative for hyperdense vessel. Skull: Negative Sinuses/Orbits: Negative Other: None IMPRESSION: Atrophy and chronic microvascular ischemia.  No acute abnormality. Electronically Signed   By: Franchot Gallo M.D.    On: 03/14/2017 14:35   Dg Chest Port 1 View  Result Date: 03/14/2017 CLINICAL DATA:  Dyspnea EXAM: PORTABLE CHEST 1 VIEW COMPARISON:  12/28/2016 chest radiograph. FINDINGS: Stable configuration of 2 lead left subclavian pacemaker. Pacer pad overlies the left chest. Stable cardiomediastinal silhouette with mild cardiomegaly, moderate to large hiatal hernia and aortic atherosclerosis. No pneumothorax. Trace bilateral pleural effusions. No overt pulmonary edema. Stable compressive atelectasis in the medial lower lobes. No acute consolidative airspace disease. IMPRESSION: 1. Stable cardiomegaly without overt pulmonary edema. 2. Stable moderate to large hiatal hernia with associated compressive atelectasis in the medial lower lobes. 3. No acute pulmonary disease. Electronically Signed   By: Ilona Sorrel M.D.   On: 03/14/2017 10:37      Echocardiogram (03/15/17)  Study Conclusions  - Left ventricle: The cavity size was normal. Wall thickness was   increased in a pattern of mild LVH. Systolic function was normal.   The estimated ejection fraction was in the range of 60% to 65%.   Wall motion was normal; there were no regional wall motion   abnormalities. Features are consistent with a pseudonormal left   ventricular filling pattern, with concomitant abnormal relaxation   and increased filling pressure (grade 2 diastolic dysfunction).   Doppler parameters are consistent with high ventricular filling   pressure. - Aortic valve: There was mild regurgitation. Valve area (VTI):   2.22 cm^2. Valve area (Vmax): 2.44 cm^2. Valve area (Vmean): 2.14   cm^2. - Mitral valve: Mildly calcified annulus. Mildly thickened leaflets   . There was mild regurgitation. Valve area by continuity equation   (using LVOT flow): 2.06 cm^2. - Left atrium: The atrium was moderately to severely dilated. - Right ventricle: The cavity size was mildly dilated. - Right atrium: The atrium was moderately dilated. - Atrial  septum: No defect or patent foramen ovale was identified. - Tricuspid valve: There was moderate-severe regurgitation. - Pulmonary arteries: Systolic pressure was moderately increased.   PA peak pressure: 61 mm Hg (S).   Subjective: Reports no dyspnea or chest pain. No palpitations.  Discharge Exam: Vitals:   03/15/17 0500 03/15/17 0923  BP: (!) 129/57 (!) 101/46  Pulse: (!) 59 60  Resp: 18 18  Temp: 98 F (36.7 C) 97.7 F (36.5 C)   Vitals:   03/14/17 1743 03/14/17 2212 03/15/17 0500 03/15/17 0923  BP: (!) 144/61 (!) 111/44 (!) 129/57 (!) 101/46  Pulse: 61 60 (!) 59 60  Resp: 18 16 18 18   Temp: 97.8 F (36.6 C) 97.5 F (36.4 C) 98 F (36.7 C) 97.7 F (36.5 C)  TempSrc: Oral Oral Oral Oral  SpO2: 100% 95% 96% 98%  Weight: 108.5 kg (239 lb 1.6 oz)  107.8 kg (237 lb 9.6 oz)  Height: 6' (1.829 m)       General: Pt is alert, awake, not in acute distress Cardiovascular: RRR, S1/S2 +, no rubs, no gallops Respiratory: CTA bilaterally, no wheezing, no rhonchi Abdominal: Soft, NT, ND, bowel sounds + Extremities: no edema, no cyanosis    The results of significant diagnostics from this hospitalization (including imaging, microbiology, ancillary and laboratory) are listed below for reference.     Microbiology: No results found for this or any previous visit (from the past 240 hour(s)).   Labs: BNP (last 3 results)  Recent Labs  04/12/16 1153 10/14/16 1001 03/14/17 1005  BNP 658.5* 160.1* 426.8*   Basic Metabolic Panel:  Recent Labs Lab 03/14/17 1005 03/15/17 0507  NA 137 139  K 4.7 3.8  CL 98* 101  CO2 26 28  GLUCOSE 181* 125*  BUN 31* 32*  CREATININE 2.18* 2.02*  CALCIUM 9.2 8.7*   Liver Function Tests:  Recent Labs Lab 03/14/17 1005  AST 27  ALT 16  ALKPHOS 125  BILITOT 0.7  PROT 7.7  ALBUMIN 3.4*   No results for input(s): LIPASE, AMYLASE in the last 168 hours. No results for input(s): AMMONIA in the last 168 hours. CBC:  Recent  Labs Lab 03/14/17 1005 03/15/17 0507  WBC 4.4 4.3  NEUTROABS 3.2  --   HGB 9.7* 8.6*  HCT 33.6* 28.9*  MCV 81.4 80.3  PLT 430* 390   Cardiac Enzymes: No results for input(s): CKTOTAL, CKMB, CKMBINDEX, TROPONINI in the last 168 hours. BNP: Invalid input(s): POCBNP CBG:  Recent Labs Lab 03/14/17 1758 03/14/17 2221 03/15/17 0613 03/15/17 1200  GLUCAP 108* 239* 127* 147*   D-Dimer No results for input(s): DDIMER in the last 72 hours. Hgb A1c No results for input(s): HGBA1C in the last 72 hours. Lipid Profile No results for input(s): CHOL, HDL, LDLCALC, TRIG, CHOLHDL, LDLDIRECT in the last 72 hours. Thyroid function studies No results for input(s): TSH, T4TOTAL, T3FREE, THYROIDAB in the last 72 hours.  Invalid input(s): FREET3 Anemia work up No results for input(s): VITAMINB12, FOLATE, FERRITIN, TIBC, IRON, RETICCTPCT in the last 72 hours. Urinalysis    Component Value Date/Time   COLORURINE YELLOW 03/14/2017 1320   APPEARANCEUR CLEAR 03/14/2017 1320   APPEARANCEUR Clear 01/06/2017 1601   LABSPEC 1.011 03/14/2017 1320   LABSPEC 1.008 11/30/2011 0748   PHURINE 7.0 03/14/2017 1320   GLUCOSEU NEGATIVE 03/14/2017 1320   GLUCOSEU NEGATIVE 10/22/2016 1628   HGBUR NEGATIVE 03/14/2017 1320   BILIRUBINUR NEGATIVE 03/14/2017 1320   BILIRUBINUR Negative 01/06/2017 1601   BILIRUBINUR Negative 11/30/2011 0748   KETONESUR NEGATIVE 03/14/2017 1320   PROTEINUR NEGATIVE 03/14/2017 1320   UROBILINOGEN 0.2 10/22/2016 1628   NITRITE NEGATIVE 03/14/2017 1320   LEUKOCYTESUR NEGATIVE 03/14/2017 1320   LEUKOCYTESUR Negative 01/06/2017 1601   LEUKOCYTESUR 3+ 11/30/2011 0748     SIGNED:   Cordelia Poche, MD [facility] 03/15/2017, 1:12 PM Pager [phone]  If 7PM-7AM, please contact night-coverage [email or web address] Password [email or web address]

## 2017-03-15 NOTE — Progress Notes (Signed)
Progress Note  Patient Name: [name] [name] Date of Encounter: 03/15/2017  Primary Cardiologist: Allred  Subjective   No chest pain or sob. No palpitations.  Inpatient Medications    Scheduled Meds: . amiodarone  200 mg Oral Daily  . furosemide  80 mg Oral BID  . gabapentin  600 mg Oral BID  . insulin aspart  0-5 Units Subcutaneous QHS  . insulin aspart  0-9 Units Subcutaneous TID WC  . metoprolol succinate  50 mg Oral Daily  . sodium chloride flush  3 mL Intravenous Q12H  . warfarin  2.5 mg Oral q1800  . Warfarin - Physician Dosing Inpatient   Does not apply q1800   Continuous Infusions:  PRN Meds: acetaminophen **OR** acetaminophen, ALPRAZolam, HYDROcodone-acetaminophen, ipratropium-albuterol, ondansetron **OR** ondansetron (ZOFRAN) IV   Vital Signs    Vitals:   03/14/17 1743 03/14/17 2212 03/15/17 0500 03/15/17 0923  BP: (!) 144/61 (!) 111/44 (!) 129/57 (!) 101/46  Pulse: 61 60 (!) 59 60  Resp: 18 16 18 18   Temp: 97.8 F (36.6 C) 97.5 F (36.4 C) 98 F (36.7 C) 97.7 F (36.5 C)  TempSrc: Oral Oral Oral Oral  SpO2: 100% 95% 96% 98%  Weight: 239 lb 1.6 oz (108.5 kg)  237 lb 9.6 oz (107.8 kg)   Height: 6' (1.829 m)       Intake/Output Summary (Last 24 hours) at 03/15/17 1132 Last data filed at 03/15/17 0848  Gross per 24 hour  Intake              490 ml  Output             2200 ml  Net            -1710 ml   Filed Weights   03/14/17 1743 03/15/17 0500  Weight: 239 lb 1.6 oz (108.5 kg) 237 lb 9.6 oz (107.8 kg)    Telemetry    Atrial flutter, NSR with nsr and AV pacing and ventricular pacing - Personally Reviewed  ECG    Atrial flutter with variable AV conduciton  - Personally Reviewed  Physical Exam   GEN: No acute distress.   Neck: No JVD Cardiac: RRR, no murmurs, rubs, or gallops.  Respiratory: Clear to auscultation bilaterally. GI: Soft, nontender, non-distended  MS: No edema; No deformity. Neuro:  Nonfocal  Psych: Normal affect    Labs    Chemistry Recent Labs Lab 03/14/17 1005 03/15/17 0507  NA 137 139  K 4.7 3.8  CL 98* 101  CO2 26 28  GLUCOSE 181* 125*  BUN 31* 32*  CREATININE 2.18* 2.02*  CALCIUM 9.2 8.7*  PROT 7.7  --   ALBUMIN 3.4*  --   AST 27  --   ALT 16  --   ALKPHOS 125  --   BILITOT 0.7  --   GFRNONAA 20* 21*  GFRAA 23* 25*  ANIONGAP 13 10     Hematology Recent Labs Lab 03/14/17 1005 03/15/17 0507  WBC 4.4 4.3  RBC 4.13 3.60*  HGB 9.7* 8.6*  HCT 33.6* 28.9*  MCV 81.4 80.3  MCH 23.5* 23.9*  MCHC 28.9* 29.8*  RDW 15.4 15.3  PLT 430* 390    Cardiac EnzymesNo results for input(s): TROPONINI in the last 168 hours. No results for input(s): TROPIPOC in the last 168 hours.   BNP Recent Labs Lab 03/14/17 1005  BNP 284.4*     DDimer No results for input(s): DDIMER in the last 168 hours.   Radiology  Ct Head Wo Contrast  Result Date: 03/14/2017 CLINICAL DATA:  Weakness EXAM: CT HEAD WITHOUT CONTRAST TECHNIQUE: Contiguous axial images were obtained from the base of the skull through the vertex without intravenous contrast. COMPARISON:  CT head 10/14/2016 FINDINGS: Brain: Moderate chronic microvascular ischemic change. Mild atrophy. Negative for acute infarct, hemorrhage, or mass lesion. Vascular: Negative for hyperdense vessel. Skull: Negative Sinuses/Orbits: Negative Other: None IMPRESSION: Atrophy and chronic microvascular ischemia.  No acute abnormality. Electronically Signed   By: Franchot Gallo M.D.   On: 03/14/2017 14:35   Dg Chest Port 1 View  Result Date: 03/14/2017 CLINICAL DATA:  Dyspnea EXAM: PORTABLE CHEST 1 VIEW COMPARISON:  12/28/2016 chest radiograph. FINDINGS: Stable configuration of 2 lead left subclavian pacemaker. Pacer pad overlies the left chest. Stable cardiomediastinal silhouette with mild cardiomegaly, moderate to large hiatal hernia and aortic atherosclerosis. No pneumothorax. Trace bilateral pleural effusions. No overt pulmonary edema. Stable  compressive atelectasis in the medial lower lobes. No acute consolidative airspace disease. IMPRESSION: 1. Stable cardiomegaly without overt pulmonary edema. 2. Stable moderate to large hiatal hernia with associated compressive atelectasis in the medial lower lobes. 3. No acute pulmonary disease. Electronically Signed   By: Ilona Sorrel M.D.   On: 03/14/2017 10:37    Cardiac Studies   none  Patient Profile     83 y.o. female admitted with atrial fib/flutter and ventricular pacing.   Assessment & Plan    1. Wide QRS rhythm - this appears to be due to tracking of every other p wave resulting in ventricular pacing at 110/min. No indication for any additional treatment.  2. Paroxysmal atrial fib and flutter - she is maintaining nsr mostly. She will continue the amiodarone. 3. Sinus node dysfunction - she appears to be appropriately AV paced.  4. PPM - her PPM has been interogated and appears to be working normally.  Johny Pitstick,M.D.   Signed, Cristopher Peru, MD  03/15/2017, 11:32 AM  Patient ID: [name] [name], female   DOB: 1930/11/26, 83 y.o.   MRN: [id number]

## 2017-03-18 ENCOUNTER — Telehealth: Payer: Self-pay | Admitting: Internal Medicine

## 2017-03-18 ENCOUNTER — Inpatient Hospital Stay (HOSPITAL_COMMUNITY)
Admission: EM | Admit: 2017-03-18 | Discharge: 2017-03-22 | DRG: 204 | Disposition: A | Discharge status: Home Health | Payer: Medicare HMO | Attending: Family Medicine | Admitting: Family Medicine

## 2017-03-18 ENCOUNTER — Encounter (HOSPITAL_COMMUNITY): Payer: Self-pay | Admitting: Emergency Medicine

## 2017-03-18 ENCOUNTER — Emergency Department (HOSPITAL_COMMUNITY): Payer: Medicare HMO

## 2017-03-18 DIAGNOSIS — N179 Acute kidney failure, unspecified: Secondary | ICD-10-CM | POA: Diagnosis present

## 2017-03-18 DIAGNOSIS — I130 Hypertensive heart and chronic kidney disease with heart failure and stage 1 through stage 4 chronic kidney disease, or unspecified chronic kidney disease: Secondary | ICD-10-CM | POA: Diagnosis present

## 2017-03-18 DIAGNOSIS — R0902 Hypoxemia: Secondary | ICD-10-CM

## 2017-03-18 DIAGNOSIS — I5032 Chronic diastolic (congestive) heart failure: Secondary | ICD-10-CM | POA: Diagnosis not present

## 2017-03-18 DIAGNOSIS — R0609 Other forms of dyspnea: Secondary | ICD-10-CM | POA: Diagnosis not present

## 2017-03-18 DIAGNOSIS — D649 Anemia, unspecified: Secondary | ICD-10-CM | POA: Diagnosis present

## 2017-03-18 DIAGNOSIS — R911 Solitary pulmonary nodule: Secondary | ICD-10-CM

## 2017-03-18 DIAGNOSIS — I495 Sick sinus syndrome: Secondary | ICD-10-CM | POA: Diagnosis not present

## 2017-03-18 DIAGNOSIS — Z79899 Other long term (current) drug therapy: Secondary | ICD-10-CM

## 2017-03-18 DIAGNOSIS — D631 Anemia in chronic kidney disease: Secondary | ICD-10-CM | POA: Diagnosis not present

## 2017-03-18 DIAGNOSIS — I10 Essential (primary) hypertension: Secondary | ICD-10-CM | POA: Diagnosis present

## 2017-03-18 DIAGNOSIS — Z825 Family history of asthma and other chronic lower respiratory diseases: Secondary | ICD-10-CM

## 2017-03-18 DIAGNOSIS — R0600 Dyspnea, unspecified: Secondary | ICD-10-CM

## 2017-03-18 DIAGNOSIS — I2510 Atherosclerotic heart disease of native coronary artery without angina pectoris: Secondary | ICD-10-CM | POA: Diagnosis not present

## 2017-03-18 DIAGNOSIS — E1122 Type 2 diabetes mellitus with diabetic chronic kidney disease: Secondary | ICD-10-CM | POA: Diagnosis not present

## 2017-03-18 DIAGNOSIS — N183 Chronic kidney disease, stage 3 unspecified: Secondary | ICD-10-CM | POA: Diagnosis present

## 2017-03-18 DIAGNOSIS — E1142 Type 2 diabetes mellitus with diabetic polyneuropathy: Secondary | ICD-10-CM | POA: Diagnosis present

## 2017-03-18 DIAGNOSIS — I471 Supraventricular tachycardia: Secondary | ICD-10-CM | POA: Diagnosis present

## 2017-03-18 DIAGNOSIS — T462X5A Adverse effect of other antidysrhythmic drugs, initial encounter: Secondary | ICD-10-CM | POA: Diagnosis present

## 2017-03-18 DIAGNOSIS — Z7901 Long term (current) use of anticoagulants: Secondary | ICD-10-CM

## 2017-03-18 DIAGNOSIS — Z853 Personal history of malignant neoplasm of breast: Secondary | ICD-10-CM

## 2017-03-18 DIAGNOSIS — R0602 Shortness of breath: Secondary | ICD-10-CM

## 2017-03-18 DIAGNOSIS — I272 Pulmonary hypertension, unspecified: Secondary | ICD-10-CM | POA: Diagnosis not present

## 2017-03-18 DIAGNOSIS — E1140 Type 2 diabetes mellitus with diabetic neuropathy, unspecified: Secondary | ICD-10-CM | POA: Diagnosis not present

## 2017-03-18 DIAGNOSIS — Z7984 Long term (current) use of oral hypoglycemic drugs: Secondary | ICD-10-CM

## 2017-03-18 DIAGNOSIS — Z950 Presence of cardiac pacemaker: Secondary | ICD-10-CM

## 2017-03-18 DIAGNOSIS — E119 Type 2 diabetes mellitus without complications: Secondary | ICD-10-CM

## 2017-03-18 DIAGNOSIS — E876 Hypokalemia: Secondary | ICD-10-CM | POA: Diagnosis present

## 2017-03-18 DIAGNOSIS — Z86711 Personal history of pulmonary embolism: Secondary | ICD-10-CM

## 2017-03-18 DIAGNOSIS — T462X1A Poisoning by other antidysrhythmic drugs, accidental (unintentional), initial encounter: Secondary | ICD-10-CM

## 2017-03-18 DIAGNOSIS — Z87891 Personal history of nicotine dependence: Secondary | ICD-10-CM

## 2017-03-18 DIAGNOSIS — N184 Chronic kidney disease, stage 4 (severe): Secondary | ICD-10-CM | POA: Diagnosis not present

## 2017-03-18 DIAGNOSIS — I480 Paroxysmal atrial fibrillation: Secondary | ICD-10-CM

## 2017-03-18 DIAGNOSIS — I517 Cardiomegaly: Secondary | ICD-10-CM | POA: Diagnosis not present

## 2017-03-18 DIAGNOSIS — Z8249 Family history of ischemic heart disease and other diseases of the circulatory system: Secondary | ICD-10-CM

## 2017-03-18 DIAGNOSIS — I4891 Unspecified atrial fibrillation: Secondary | ICD-10-CM | POA: Diagnosis present

## 2017-03-18 DIAGNOSIS — F0390 Unspecified dementia without behavioral disturbance: Secondary | ICD-10-CM | POA: Diagnosis present

## 2017-03-18 DIAGNOSIS — R069 Unspecified abnormalities of breathing: Secondary | ICD-10-CM | POA: Diagnosis not present

## 2017-03-18 LAB — COMPREHENSIVE METABOLIC PANEL
ALT: 13 U/L — ABNORMAL LOW (ref 14–54)
ANION GAP: 9 (ref 5–15)
AST: 19 U/L (ref 15–41)
Albumin: 3.4 g/dL — ABNORMAL LOW (ref 3.5–5.0)
Alkaline Phosphatase: 115 U/L (ref 38–126)
BUN: 40 mg/dL — ABNORMAL HIGH (ref 6–20)
CHLORIDE: 101 mmol/L (ref 101–111)
CO2: 27 mmol/L (ref 22–32)
Calcium: 9 mg/dL (ref 8.9–10.3)
Creatinine, Ser: 2.22 mg/dL — ABNORMAL HIGH (ref 0.44–1.00)
GFR, EST AFRICAN AMERICAN: 22 mL/min — AB (ref >60)
GFR, EST NON AFRICAN AMERICAN: 19 mL/min — AB (ref >60)
Glucose, Bld: 120 mg/dL — ABNORMAL HIGH (ref 65–99)
POTASSIUM: 4.1 mmol/L (ref 3.5–5.1)
Sodium: 137 mmol/L (ref 135–145)
Total Bilirubin: 0.7 mg/dL (ref 0.3–1.2)
Total Protein: 7.4 g/dL (ref 6.5–8.1)

## 2017-03-18 LAB — GLUCOSE, CAPILLARY: Glucose-Capillary: 104 mg/dL — ABNORMAL HIGH (ref 65–99)

## 2017-03-18 LAB — I-STAT TROPONIN, ED: Troponin i, poc: 0.01 ng/mL (ref 0.00–0.08)

## 2017-03-18 LAB — CBC WITH DIFFERENTIAL/PLATELET
Basophils Absolute: 0.1 10*3/uL (ref 0.0–0.1)
Basophils Relative: 2 %
EOS ABS: 0.1 10*3/uL (ref 0.0–0.7)
Eosinophils Relative: 2 %
HCT: 29.8 % — ABNORMAL LOW (ref 36.0–46.0)
HEMOGLOBIN: 8.8 g/dL — AB (ref 12.0–15.0)
LYMPHS ABS: 0.8 10*3/uL (ref 0.7–4.0)
LYMPHS PCT: 12 %
MCH: 23.7 pg — AB (ref 26.0–34.0)
MCHC: 29.5 g/dL — ABNORMAL LOW (ref 30.0–36.0)
MCV: 80.1 fL (ref 78.0–100.0)
Monocytes Absolute: 0.5 10*3/uL (ref 0.1–1.0)
Monocytes Relative: 8 %
NEUTROS PCT: 78 %
Neutro Abs: 5.3 10*3/uL (ref 1.7–7.7)
Platelets: 436 10*3/uL — ABNORMAL HIGH (ref 150–400)
RBC: 3.72 MIL/uL — AB (ref 3.87–5.11)
RDW: 15.2 % (ref 11.5–15.5)
WBC: 6.8 10*3/uL (ref 4.0–10.5)

## 2017-03-18 LAB — VITAMIN B12: VITAMIN B 12: 623 pg/mL (ref 180–914)

## 2017-03-18 LAB — D-DIMER, QUANTITATIVE (NOT AT ARMC)

## 2017-03-18 LAB — PROTIME-INR
INR: 2.67
Prothrombin Time: 28.9 seconds — ABNORMAL HIGH (ref 11.4–15.2)

## 2017-03-18 LAB — IRON AND TIBC
IRON: 17 ug/dL — AB (ref 28–170)
Saturation Ratios: 3 % — ABNORMAL LOW (ref 10.4–31.8)
TIBC: 543 ug/dL — ABNORMAL HIGH (ref 250–450)
UIBC: 526 ug/dL

## 2017-03-18 LAB — RETICULOCYTES
RBC.: 3.66 MIL/uL — ABNORMAL LOW (ref 3.87–5.11)
RETIC COUNT ABSOLUTE: 54.9 10*3/uL (ref 19.0–186.0)
Retic Ct Pct: 1.5 % (ref 0.4–3.1)

## 2017-03-18 LAB — FERRITIN: FERRITIN: 7 ng/mL — AB (ref 11–307)

## 2017-03-18 LAB — POC OCCULT BLOOD, ED: Fecal Occult Bld: NEGATIVE

## 2017-03-18 LAB — BRAIN NATRIURETIC PEPTIDE: B Natriuretic Peptide: 410 pg/mL — ABNORMAL HIGH (ref 0.0–100.0)

## 2017-03-18 LAB — FOLATE: Folate: 35.1 ng/mL (ref >5.9)

## 2017-03-18 MED ORDER — METOPROLOL SUCCINATE ER 25 MG PO TB24
25.0000 mg | ORAL_TABLET | Freq: Every day | ORAL | Status: DC
  Administered 2017-03-19 – 2017-03-22 (×4): 25 mg via ORAL
  Filled 2017-03-18 (×4): qty 1

## 2017-03-18 MED ORDER — PROMETHAZINE HCL 25 MG PO TABS: 25.0000 mg | ORAL_TABLET | Freq: Three times a day (TID) | ORAL | Status: DC | PRN

## 2017-03-18 MED ORDER — DOXYCYCLINE HYCLATE 100 MG PO TABS
100.0000 mg | ORAL_TABLET | Freq: Once | ORAL | Status: AC
Start: 2017-03-18 — End: 2017-03-18
  Administered 2017-03-18: 100 mg via ORAL
  Filled 2017-03-18: qty 1

## 2017-03-18 MED ORDER — GABAPENTIN 300 MG PO CAPS
600.0000 mg | ORAL_CAPSULE | Freq: Every day | ORAL | Status: DC
  Administered 2017-03-19 – 2017-03-22 (×4): 600 mg via ORAL
  Filled 2017-03-18 (×4): qty 2

## 2017-03-18 MED ORDER — ALPRAZOLAM 0.25 MG PO TABS
0.2500 mg | ORAL_TABLET | Freq: Two times a day (BID) | ORAL | Status: DC | PRN
Start: 2017-03-18 — End: 2017-03-22
  Administered 2017-03-18: 0.25 mg via ORAL
  Filled 2017-03-18: qty 1

## 2017-03-18 MED ORDER — HYDROCODONE-ACETAMINOPHEN 5-325 MG PO TABS
0.5000 | ORAL_TABLET | Freq: Two times a day (BID) | ORAL | Status: DC | PRN
  Administered 2017-03-19 – 2017-03-21 (×4): 1 via ORAL
  Filled 2017-03-18 (×4): qty 1

## 2017-03-18 MED ORDER — FUROSEMIDE 80 MG PO TABS: 80.0000 mg | ORAL_TABLET | Freq: Every day | ORAL | Status: DC

## 2017-03-18 MED ORDER — WARFARIN - PHARMACIST DOSING INPATIENT
Freq: Every day | Status: DC
  Administered 2017-03-20: 1

## 2017-03-18 MED ORDER — VITAMIN B-12 1000 MCG PO TABS
500.0000 ug | ORAL_TABLET | Freq: Every day | ORAL | Status: DC
  Administered 2017-03-19 – 2017-03-22 (×4): 500 ug via ORAL
  Filled 2017-03-18 (×4): qty 1

## 2017-03-18 MED ORDER — VITAMIN B-6 100 MG PO TABS
200.0000 mg | ORAL_TABLET | Freq: Every day | ORAL | Status: DC
  Administered 2017-03-19 – 2017-03-22 (×4): 200 mg via ORAL
  Filled 2017-03-18 (×4): qty 2

## 2017-03-18 MED ORDER — FUROSEMIDE 80 MG PO TABS
80.0000 mg | ORAL_TABLET | Freq: Every morning | ORAL | Status: DC
  Administered 2017-03-19 – 2017-03-20 (×2): 80 mg via ORAL
  Filled 2017-03-18 (×2): qty 1

## 2017-03-18 MED ORDER — WARFARIN SODIUM 2.5 MG PO TABS
2.5000 mg | ORAL_TABLET | Freq: Every day | ORAL | Status: DC
  Administered 2017-03-19 – 2017-03-21 (×3): 2.5 mg via ORAL
  Filled 2017-03-18 (×3): qty 1

## 2017-03-18 MED ORDER — FUROSEMIDE 40 MG PO TABS
40.0000 mg | ORAL_TABLET | Freq: Every evening | ORAL | Status: DC
  Administered 2017-03-18: 40 mg via ORAL
  Filled 2017-03-18: qty 1

## 2017-03-18 MED ORDER — AMIODARONE HCL 200 MG PO TABS
200.0000 mg | ORAL_TABLET | Freq: Every day | ORAL | Status: DC
  Administered 2017-03-19 – 2017-03-21 (×3): 200 mg via ORAL
  Filled 2017-03-18 (×4): qty 1

## 2017-03-18 NOTE — Telephone Encounter (Signed)
Routed to primary RN to make aware.

## 2017-03-18 NOTE — ED Notes (Signed)
Pacemaker interrogated and plugged in to phone jack

## 2017-03-18 NOTE — ED Notes (Signed)
Spoke to lab, will add on BNP

## 2017-03-18 NOTE — ED Notes (Signed)
Patient transported to radiology

## 2017-03-18 NOTE — ED Notes (Signed)
Patient's family heading home for the evening.  Conversed with the RN about patient's current home pain medication regimen, and how they have concerns patient may be overdosing.  Will make admission RN aware of concerns.

## 2017-03-18 NOTE — H&P (Signed)
History and Physical    [name] [name] PYP:[id number] DOB: 1930-11-13 DOA: 03/18/2017  PCP: Hoyt Koch, MD Patient coming from: Home  Chief Complaint: Dyspnea  HPI: [name] [name] is a 83 y.o. female with medical history significant of Pulmonary htn, sick sinus syndrome s/p pacer, atrial fibrillation, hypertension, CKD 3, cognitive impairment. Patient presents with dyspnea that has worsened over the last three days. Dyspnea is exertional without associated chest pain or diaphoresis. Improved with resting. She has not done anything else to improve symptoms. Daughter reports a 25 lb weight gain over three months. No wheezing or coughing. No fevers.   Patient was recently here for runs of V. tach secondary to pacemaker malfunction. This was adjusted by cardiology at the time. Patient had some dyspnea that time as well after discharge.  ED Course: Vitals: Afebrile. Pulse in 60s. Respirations in the teens. Blood pressure 20s over 50s. On room air and 96%. Labs: Hemoglobin 8.8. Blood glucose of 120. BUN of 40. Creatinine of 2.22. Imaging: Concern for possible pneumonia on chest x-ray in addition to moderate to large hiatal hernia. Medications/Course: Doxycycline given  Review of Systems: Review of Systems  Constitutional: Positive for chills. Negative for fever.  Respiratory: Positive for shortness of breath. Negative for cough, hemoptysis, sputum production and wheezing.   Cardiovascular: Negative for chest pain, palpitations, orthopnea, leg swelling and PND.  Gastrointestinal: Negative for abdominal pain, blood in stool, constipation, diarrhea, nausea and vomiting.  Genitourinary: Negative for dysuria.  Neurological: Positive for weakness.  All other systems reviewed and are negative.   Past Medical History:  Diagnosis Date  . Acute gastric ulcer without mention of hemorrhage, perforation, or obstruction 2006   EGD  . Anxiety   . Atrial fibrillation (HCC)    flecainide; coumadin  . Breast cancer ([place])    left  . Carotid artery disease ([place])    24/58: RICA 0-99%, LICA 83-38%  //  Car[place]ud [place] 25/05: RICA 3-97%; LICA 67-34% >> FU 1 year   . Chronic diastolic heart failure (HCC)    a. echo 10/10: mild LVH, EF 55-60%, mild AI, mild MR, mild to mod LAE, mild RVE, severe RAE, mod TR, PASP 53  /  b.  Echo 3/14:  Mild LVH, EF 55%, Tr AI, MAC, mild MR, mod LAE, PASP 31, trivial eff. // c. echo 1/17: EF 55-60%, mild AI, MAC, mild MR, moderate BAE, moderate TR  . Chronic lower back pain   . Diabetic peripheral neuropathy ([place]) 03/09/2015  . Diverticulosis   . Esophagitis, unspecified 2006   EGD  . Hiatal hernia 2006   EGD  . History of nuclear stress test    a. Myoview 2/12: EF 69%, no scar or ischemia  . Hx of adenomatous colonic polyps   . Hypertension   . Mediastinal lymphadenopathy   . Memory difficulty 11/14/2016  . Mitral regurgitation    mild, echo, October, 2010  . Osteopenia   . Presence of permanent cardiac pacemaker   . Pulmonary embolus [facility]) 2006   2006, with DVT  . Pulmonary HTN ([place])    53 mmHg echo, 2010, moderate TR, mild right ventricular enlargement  . Tachy-brady syndrome ([place])    s/p pacer 07/2009  . Thyroid nodule    non-neoplastic goiter  . Type II diabetes mellitus ([place])   . Venous insufficiency    Chronic    Past Surgical History:  Procedure Laterality Date  . ABDOMINAL HYSTERECTOMY    . ANKLE FRACTURE SURGERY Right   .  BACK SURGERY    . BREAST LUMPECTOMY Left   . FRACTURE SURGERY    . INSERT / REPLACE / REMOVE PACEMAKER  06/30/2009   MDT Adalpta L implanted by Dr Olevia Perches  . LUMBAR LAMINECTOMY  1973, 06/22/2011   "no hardware either time"     reports that she quit smoking about 18 years ago. Her smoking use included Cigarettes. She has a 47.00 pack-year smoking history. She has never used smokeless tobacco. She reports that she drinks alcohol. She reports that she does not use drugs.  Allergies  Allergen  Reactions  . Flecainide Shortness Of Breath and Other (See Comments)    toxicity  . Morphine And Related Other (See Comments)    Family requests no morphine due to past reaction. Pt stated it made her sick.  . Cephalexin Nausea And Vomiting    Family History  Problem Relation Age of Onset  . Heart disease Mother   . Hypertension Mother   . COPD Father   . Hypertension Father   . Hypertension Sister   . Hypertension Brother   . Hypertension Daughter   . Colon cancer Neg Hx   . Heart attack Neg Hx   . Stroke Neg Hx     Prior to Admission medications   Medication Sig Start Date End Date Taking? Authorizing Provider  ALPRAZolam (XANAX) 0.25 MG tablet Take 1 tablet (0.25 mg total) by mouth 2 (two) times daily as needed for anxiety. 01/27/17  Yes Hoyt Koch, MD  amiodarone (PACERONE) 200 MG tablet Take 1 tablet (200 mg total) by mouth daily. 11/20/16  Yes Allred, Jeneen Rinks, MD  furosemide (LASIX) 80 MG tablet Take 1 tablet (80 mg total) by mouth daily. TAKE 80 MG IN AM AND 40 MG IN PM Patient taking differently: Take 80 mg by mouth 2 (two) times daily.  01/20/17  Yes Baldwin Jamaica, PA-C  gabapentin (NEURONTIN) 300 MG capsule Take 600 mg by mouth 2 (two) times daily.    Yes [provider]  HYDROcodone-acetaminophen (NORCO/VICODIN) 5-325 MG tablet Take 0.5-1 tablets by mouth 2 (two) times daily as needed for moderate pain. 10/10/16  Yes Hoyt Koch, MD  metFORMIN (GLUCOPHAGE) 500 MG tablet Take 500 mg by mouth daily with breakfast.    Yes [provider]  metoprolol succinate (TOPROL-XL) 25 MG 24 hr tablet Take 25 mg by mouth daily.   Yes [provider]  potassium chloride 20 MEQ/15ML (10%) SOLN Take 20 mEq by mouth daily as needed (if have not eaten a banana).   Yes [provider]  promethazine (PHENERGAN) 25 MG tablet Take 1 tablet (25 mg total) by mouth every 8 (eight) hours as needed for nausea or vomiting. 10/22/16  Yes Hoyt Koch, MD  pyridoxine (B-6) 200 MG tablet Take 200 mg by mouth daily.   Yes [provider]  vitamin B-12 (CYANOCOBALAMIN) 500 MCG tablet Take 500 mcg by mouth daily.    Yes [provider]  warfarin (COUMADIN) 5 MG tablet Take 2.5 mg by mouth daily.   Yes [provider]  metoprolol succinate (TOPROL-XL) 50 MG 24 hr tablet Take 1 tablet (50 mg total) by mouth daily. Patient not taking: Reported on 03/18/2017 01/20/17   Baldwin Jamaica, PA-C    Physical Exam: Vitals:   03/18/17 1445 03/18/17 1653 03/18/17 1700  BP: (!) 128/52 (!) 108/54 (!) 124/52  Pulse: (!) 59 60 60  Resp: 14 (!) 21 11  Temp: 98.3 F (36.8  C)    TempSrc: Oral    SpO2: 100% 97% 96%     Constitutional: NAD, calm, comfortable Eyes: PERRL, lids and conjunctivae normal ENMT: Mucous membranes are moist. Posterior pharynx clear of any exudate or lesions. Top dentures, adentulous on bottom.  Neck: normal, supple, no masses, no thyromegaly Respiratory: clear to auscultation bilaterally, no wheezing, no crackles. Normal respiratory effort. No accessory muscle use.  Cardiovascular: Regular rate and rhythm, no murmurs / rubs / gallops. No extremity edema. 2+ pedal pulses. No carotid bruits.  Abdomen: no tenderness, no masses palpated. Bowel sounds positive.  Musculoskeletal: no clubbing / cyanosis. No joint deformity upper and lower extremities. Good ROM, no contractures. Normal muscle tone.  Skin: no rashes, lesions, ulcers. No induration Neurologic: CN 2-12 grossly intact. Sensation intact, DTR normal. Strength 5/5 in all 4.  Psychiatric: Normal judgment and insight. Alert and oriented x 3 (knew year but not month and date). Normal mood. Some short term memory loss.   Labs on Admission: I have personally reviewed following labs and imaging studies  CBC:  Recent Labs Lab 03/14/17 1005 03/15/17 0507 03/18/17 1508  WBC 4.4 4.3 6.8  NEUTROABS 3.2  --  5.3  HGB 9.7* 8.6* 8.8*  HCT  33.6* 28.9* 29.8*  MCV 81.4 80.3 80.1  PLT 430* 390 948*   Basic Metabolic Panel:  Recent Labs Lab 03/14/17 1005 03/15/17 0507 03/18/17 1508  NA 137 139 137  K 4.7 3.8 4.1  CL 98* 101 101  CO2 26 28 27   GLUCOSE 181* 125* 120*  BUN 31* 32* 40*  CREATININE 2.18* 2.02* 2.22*  CALCIUM 9.2 8.7* 9.0   GFR: Estimated Creatinine Clearance: 25.9 mL/min (A) (by C-G formula based on SCr of 2.22 mg/dL (H)). Liver Function Tests:  Recent Labs Lab 03/14/17 1005 03/18/17 1508  AST 27 19  ALT 16 13*  ALKPHOS 125 115  BILITOT 0.7 0.7  PROT 7.7 7.4  ALBUMIN 3.4* 3.4*   Coagulation Profile:  Recent Labs Lab 03/14/17 1005 03/15/17 0507 03/18/17 1508  INR 2.50 2.35 2.67   CBG:  Recent Labs Lab 03/14/17 1758 03/14/17 2221 03/15/17 0613 03/15/17 1200  GLUCAP 108* 239* 127* 147*   Urine analysis:    Component Value Date/Time   COLORURINE YELLOW 03/14/2017 1320   APPEARANCEUR CLEAR 03/14/2017 1320   APPEARANCEUR Clear 01/06/2017 1601   LABSPEC 1.011 03/14/2017 1320   LABSPEC 1.008 11/30/2011 0748   PHURINE 7.0 03/14/2017 1320   GLUCOSEU NEGATIVE 03/14/2017 1320   GLUCOSEU NEGATIVE 10/22/2016 1628   HGBUR NEGATIVE 03/14/2017 1320   BILIRUBINUR NEGATIVE 03/14/2017 1320   BILIRUBINUR Negative 01/06/2017 1601   BILIRUBINUR Negative 11/30/2011 0748   KETONESUR NEGATIVE 03/14/2017 1320   PROTEINUR NEGATIVE 03/14/2017 1320   UROBILINOGEN 0.2 10/22/2016 1628   NITRITE NEGATIVE 03/14/2017 1320   LEUKOCYTESUR NEGATIVE 03/14/2017 1320   LEUKOCYTESUR Negative 01/06/2017 1601   LEUKOCYTESUR 3+ 11/30/2011 0748   Radiological Exams on Admission: Dg Chest 2 View  Result Date: 03/18/2017 CLINICAL DATA:  Acute onset of shortness of breath. Initial encounter. EXAM: CHEST  2 VIEW COMPARISON:  Chest radiograph performed 03/14/2017 FINDINGS: The lungs are well-aerated. Mild retrocardiac airspace opacity raises concern for mild pneumonia, particularly on the lateral view. There is no  evidence of focal opacification, pleural effusion or pneumothorax. The heart is mildly enlarged. A pacemaker is noted at the left chest wall, with leads ending at the right atrium and right ventricle. No acute osseous abnormalities are seen. Mild chronic right-sided rib  deformities are noted. A moderate to large hiatal hernia is noted. IMPRESSION: 1. Mild retrocardiac airspace opacity raises concern for mild pneumonia, particularly on the lateral view. 2. Moderate to large hiatal hernia noted. Electronically Signed   By: Garald Balding M.D.   On: 03/18/2017 16:24    EKG: Independently reviewed. Paced rhythm.   Assessment/Plan Active Problems:   Pulmonary HTN (HCC)   Hypertension   Sick sinus syndrome (HCC)   Chronic diastolic CHF (congestive heart failure) (HCC)   CRD (chronic renal disease), stage III   Dyspnea   Diabetes mellitus type 2, noninsulin dependent ([place])   A-fib (HCC)   Dyspnea Likely community-acquired as patient had symptoms of dyspnea prior to previous admission. Agree with antibiotics at this time. Patient is on room air with no leukocytosis. No tachycardia . No leg swelling. PE well score of 1.5 for previous PE (provoked). Euvolemic on exam, BNP up to 410 from 284 four days ago. -Doxycycline -Flutter valve -Incentive spirometer -d-dimer -PT eval -lasix, strict in/out, weights  Paroxysmal atrial fibrillation CHA2DS2-VASc Score is 5. Paced rhythm. -Continue amiodarone -Continue metoprolol -Continue warfarin  Sick sinus syndrome S/p pacer. Significant for atrial tachycardia on interrogation today; nothing over the weekend. No ventricular arrhythmias.  Chronic diastolic heart failure Last EF of 66 5% with grade 2 diastolic dysfunction seen on EF from 03/15/2017. Euvolemic on exam. Blood pressure initially low, but became hypertensive in 001V systolic during exam -continue lasix  Diabetes mellitus Management with metformin as an outpatient -Sliding-scale  insulin -May need to consider not using metformin secondary to renal function  Acute kidney injury on CKD stage III Slightly above baseline of about 1.8. Up to 2.22 on admission. Patient actually appears euvolemic -repeat BMP in AM  Anemia Normocytic. FOBT negative. Baseline of around 10. Stable from three days ago -anemia panel -CBC in AM  Cognitive impairment Sounds like patient has dementia. She has been seen as an outpatient by neurology and was prescribed medication, for which she has discontinued using. She does not remember me from a few days ago. -outpatient management   DVT prophylaxis: Warfarin Code Status: Full code Family Communication: Daughter at bedside Disposition Plan: Discharge in 24 hours if improves Consults called: None Admission status: Observation, telemetry   Cordelia Poche, MD [facility] Pager [phone]  If 7PM-7AM, please contact night-coverage [email or web address] Password [facility]  03/18/2017, 5:21 PM

## 2017-03-18 NOTE — Progress Notes (Signed)
ANTICOAGULATION CONSULT NOTE - Initial Consult  Pharmacy Consult for coumadin Indication: atrial fibrillation  Allergies  Allergen Reactions  . Flecainide Shortness Of Breath and Other (See Comments)    toxicity  . Morphine And Related Other (See Comments)    Family requests no morphine due to past reaction. Pt stated it made her sick.  . Cephalexin Nausea And Vomiting    Patient Measurements: Height: 6' (182.9 cm) Weight: 238 lb 8 oz (108.2 kg) IBW/kg (Calculated) : 73.1 Heparin Dosing Weight:   Vital Signs: Temp: 97.3 F (36.3 C) (06/19 2007) Temp Source: Oral (06/19 2007) BP: 129/66 (06/19 2007) Pulse Rate: 60 (06/19 2007)  Labs:  Recent Labs  03/18/17 1508  HGB 8.8*  HCT 29.8*  PLT 436*  LABPROT 28.9*  INR 2.67  CREATININE 2.22*    Estimated Creatinine Clearance: 25.9 mL/min (A) (by C-G formula based on SCr of 2.22 mg/dL (H)).   Medical History: Past Medical History:  Diagnosis Date  . Acute gastric ulcer without mention of hemorrhage, perforation, or obstruction 2006   EGD  . Anxiety   . Atrial fibrillation (HCC)    flecainide; coumadin  . Breast cancer ([place])    left  . Carotid artery disease ([place])    41/32: RICA 4-40%, LICA 10-27%  //  Car[place]ud [place] 25/36: RICA 6-44%; LICA 03-47% >> FU 1 year   . Chronic diastolic heart failure (HCC)    a. echo 10/10: mild LVH, EF 55-60%, mild AI, mild MR, mild to mod LAE, mild RVE, severe RAE, mod TR, PASP 53  /  b.  Echo 3/14:  Mild LVH, EF 55%, Tr AI, MAC, mild MR, mod LAE, PASP 31, trivial eff. // c. echo 1/17: EF 55-60%, mild AI, MAC, mild MR, moderate BAE, moderate TR  . Chronic lower back pain   . Diabetic peripheral neuropathy ([place]) 03/09/2015  . Diverticulosis   . Esophagitis, unspecified 2006   EGD  . Hiatal hernia 2006   EGD  . History of nuclear stress test    a. Myoview 2/12: EF 69%, no scar or ischemia  . Hx of adenomatous colonic polyps   . Hypertension   . Mediastinal lymphadenopathy   . Memory  difficulty 11/14/2016  . Mitral regurgitation    mild, echo, October, 2010  . Osteopenia   . Presence of permanent cardiac pacemaker   . Pulmonary embolus [facility]) 2006   2006, with DVT  . Pulmonary HTN ([place])    53 mmHg echo, 2010, moderate TR, mild right ventricular enlargement  . Tachy-brady syndrome ([place])    s/p pacer 07/2009  . Thyroid nodule    non-neoplastic goiter  . Type II diabetes mellitus ([place])   . Venous insufficiency    Chronic    Medications:  Scheduled:  . [START ON 03/19/2017] amiodarone  200 mg Oral Daily  . furosemide  40 mg Oral QPM  . [START ON 03/19/2017] furosemide  80 mg Oral q morning - 10a  . [START ON 03/19/2017] gabapentin  600 mg Oral Daily  . [START ON 03/19/2017] metoprolol succinate  25 mg Oral Daily  . [START ON 03/19/2017] pyridOXINE  200 mg Oral Daily  . [START ON 03/19/2017] vitamin B-12  500 mcg Oral Daily   Infusions:    Assessment: 83 yo female with afib will be continued on coumadin.  INR today is 2.67.  Patient was on coumadin 2.5 mg po daily at home; last dose was this am at 0800.  Goal of Therapy:  INR 2-3 Monitor platelets by anticoagulation protocol: Yes   Plan:  - continue coumadin 2.5 mg po daily - daily INR   Damarrion Mimbs, Tsz-Yin 03/18/2017,8:27 PM

## 2017-03-18 NOTE — ED Notes (Signed)
Pt stated during orthostatic vitals signs that she was dizzy and unable to walk with tech for SpO2 while ambulating.

## 2017-03-18 NOTE — Telephone Encounter (Signed)
New message    Pt daughter is calling stating that pt is in the ER for SOB. She wanted to let Dr. Bonita Quin RN know.

## 2017-03-18 NOTE — ED Notes (Signed)
This RN attempted IV twice. Another nurse to attempt.

## 2017-03-18 NOTE — ED Triage Notes (Signed)
Per EMS patient had a purposeful grunt and was asked to stop and was able. Lungs clear. [facility] physical therapist at home trying to assist with mobility issues. She became SOB and EMS was called.

## 2017-03-18 NOTE — ED Notes (Signed)
Admitting at bedside 

## 2017-03-18 NOTE — ED Provider Notes (Signed)
[facility] DEPT Provider Note   CSN: [id number] Arrival date & time: 03/18/17  1435     History   Chief Complaint Chief Complaint  Patient presents with  . Shortness of Breath    HPI [name] [name] is a 83 y.o. female.  The history is provided by the patient and the EMS personnel. No language interpreter was used.  Shortness of Breath    [name] [name] is a 83 y.o. female who presents to the Emergency Department complaining of sob.  She presents for evaluation of shortness of breath by EMS. Level V caveat due To Confusion. Per EMS report she was receiving PT today at home when she became acutely SOB with noisy respirations.  Her breathing was thought to be effort dependent and when asked to stop noisy respirations she would.  On ED arrival pt denies any complaints and is not sure why she is in the ED.  A few moments later pt begins noisy/stridors respirations and states she is short of breath and then sounds and symptoms quickly stop.  She denies CP, fevers, cough, abdominal pain, vomiting, diarrhea, weakness.   Past Medical History:  Diagnosis Date  . Acute gastric ulcer without mention of hemorrhage, perforation, or obstruction 2006   EGD  . Anxiety   . Atrial fibrillation (HCC)    flecainide; coumadin  . Breast cancer ([place])    left  . Carotid artery disease ([place])    27/25: RICA 3-66%, LICA 44-03%  //  [place] 47/42: RICA 5-95%; LICA 63-87% >> FU 1 year   . Chronic diastolic heart failure (HCC)    a. echo 10/10: mild LVH, EF 55-60%, mild AI, mild MR, mild to mod LAE, mild RVE, severe RAE, mod TR, PASP 53  /  b.  Echo 3/14:  Mild LVH, EF 55%, Tr AI, MAC, mild MR, mod LAE, PASP 31, trivial eff. // c. echo 1/17: EF 55-60%, mild AI, MAC, mild MR, moderate BAE, moderate TR  . Chronic lower back pain   . Diabetic peripheral neuropathy ([place]) 03/09/2015  . Diverticulosis   . Esophagitis, unspecified 2006   EGD  . Hiatal hernia 2006   EGD  . History of nuclear stress  test    a. Myoview 2/12: EF 69%, no scar or ischemia  . Hx of adenomatous colonic polyps   . Hypertension   . Mediastinal lymphadenopathy   . Memory difficulty 11/14/2016  . Mitral regurgitation    mild, echo, October, 2010  . Osteopenia   . Presence of permanent cardiac pacemaker   . Pulmonary embolus [facility]) 2006   2006, with DVT  . Pulmonary HTN ([place])    53 mmHg echo, 2010, moderate TR, mild right ventricular enlargement  . Tachy-brady syndrome ([place])    s/p pacer 07/2009  . Thyroid nodule    non-neoplastic goiter  . Type II diabetes mellitus ([place])   . Venous insufficiency    Chronic    Patient Active Problem List   Diagnosis Date Noted  . Pacemaker complications 56/43/3295  . CKD (chronic kidney disease), stage IV ([place]) 03/14/2017  . Recurrent falls 01/13/2017  . Cognitive change   . A-fib ([place]) 10/14/2016  . Chronic back pain 07/28/2016  . Diabetes mellitus type 2, noninsulin dependent ([place])   . CRD (chronic renal disease), stage III 04/12/2016  . Acute on chronic renal insufficiency 04/12/2016  . Dyspnea 04/12/2016  . Hypertensive heart disease 03/28/2016  . Anemia, iron deficiency 10/22/2015  . Diabetic peripheral neuropathy ([place]) 03/09/2015  .  Chronic diastolic CHF (congestive heart failure) ([place]) 09/07/2014  . Sick sinus syndrome ([place]) 03/09/2014  . Hypertension 06/21/2013  . Osteopenia   . Pulmonary HTN ([place])   . History of pulmonary embolism   . Carotid artery disease ([place])   . History of breast cancer   . Pacemaker-Medtronic   . Syncope   . Mitral regurgitation   . Mediastinal lymphadenopathy   . THYROID NODULE, HX OF 07/19/2009    Past Surgical History:  Procedure Laterality Date  . ABDOMINAL HYSTERECTOMY    . ANKLE FRACTURE SURGERY Right   . BACK SURGERY    . BREAST LUMPECTOMY Left   . FRACTURE SURGERY    . INSERT / REPLACE / REMOVE PACEMAKER  06/30/2009   MDT Adalpta L implanted by Dr Olevia Perches  . LUMBAR LAMINECTOMY  1973, 06/22/2011   "no  hardware either time"    OB History    No data available       Home Medications    Prior to Admission medications   Medication Sig Start Date End Date Taking? Authorizing Provider  ALPRAZolam (XANAX) 0.25 MG tablet Take 1 tablet (0.25 mg total) by mouth 2 (two) times daily as needed for anxiety. 01/27/17   Hoyt Koch, MD  amiodarone (PACERONE) 200 MG tablet Take 1 tablet (200 mg total) by mouth daily. 11/20/16   Allred, Jeneen Rinks, MD  furosemide (LASIX) 80 MG tablet Take 1 tablet (80 mg total) by mouth daily. TAKE 80 MG IN AM AND 40 MG IN PM Patient taking differently: Take 80 mg by mouth 2 (two) times daily.  01/20/17   Baldwin Jamaica, PA-C  gabapentin (NEURONTIN) 300 MG capsule Take 600 mg by mouth 2 (two) times daily.     [provider]  HYDROcodone-acetaminophen (NORCO/VICODIN) 5-325 MG tablet Take 0.5-1 tablets by mouth 2 (two) times daily as needed for moderate pain. 10/10/16   Hoyt Koch, MD  metFORMIN (GLUCOPHAGE) 500 MG tablet Take 500 mg by mouth daily with breakfast.     [provider]  metoprolol succinate (TOPROL-XL) 50 MG 24 hr tablet Take 1 tablet (50 mg total) by mouth daily. 01/20/17   Baldwin Jamaica, PA-C  potassium chloride 20 MEQ/15ML (10%) SOLN Take 20 mEq by mouth daily as needed (if have not eaten a banana).    [provider]  promethazine (PHENERGAN) 25 MG tablet Take 1 tablet (25 mg total) by mouth every 8 (eight) hours as needed for nausea or vomiting. 10/22/16   Hoyt Koch, MD  pyridoxine (B-6) 200 MG tablet Take 200 mg by mouth daily.    [provider]  vitamin B-12 (CYANOCOBALAMIN) 500 MCG tablet Take 500 mcg by mouth daily.     [provider]  warfarin (COUMADIN) 5 MG tablet Take 2.5 mg by mouth daily.    [provider]    Family History Family History  Problem Relation Age of Onset  . Heart disease Mother   . Hypertension Mother   . COPD Father   . Hypertension  Father   . Hypertension Sister   . Hypertension Brother   . Hypertension Daughter   . Colon cancer Neg Hx   . Heart attack Neg Hx   . Stroke Neg Hx     Social History Social History  Substance Use Topics  . Smoking status: Former Smoker    Packs/day: 1.00    Years: 47.00    Types: Cigarettes    Quit date: 10/26/1998  .  Smokeless tobacco: Never Used  . Alcohol use Yes     Comment: 10/14/2016 "I'll have a beer q once in awhile"     Allergies   Flecainide; Morphine and related; and Cephalexin   Review of Systems Review of Systems  Respiratory: Positive for shortness of breath.   All other systems reviewed and are negative.    Physical Exam Updated Vital Signs BP (!) 138/56 (BP Location: Right Arm)   Pulse 60   Temp 98.3 F (36.8 C) (Oral)   Resp 11   SpO2 100%   Physical Exam  Constitutional: She appears well-developed and well-nourished.  HENT:  Head: Normocephalic and atraumatic.  Cardiovascular: Normal rate and regular rhythm.   Murmur heard. Pulmonary/Chest: Effort normal and breath sounds normal. No respiratory distress.  Abdominal: Soft. There is no tenderness. There is no rebound and no guarding.  Musculoskeletal: She exhibits no edema or tenderness.  Neurological:  Appears drowsy but conversant. Oriented to "hospital". Disoriented to time and recent events. 4 out of 5 strength in all 4 extremities. Poor effort on strength testing.  Skin: Skin is warm and dry.  Psychiatric: She has a normal mood and affect. Her behavior is normal.  Nursing note and vitals reviewed.    ED Treatments / Results  Labs (all labs ordered are listed, but only abnormal results are displayed) Labs Reviewed  COMPREHENSIVE METABOLIC PANEL  CBC WITH DIFFERENTIAL/PLATELET  PROTIME-INR  I-STAT [place], ED    EKG  EKG Interpretation None       Radiology No results found.  Procedures Procedures (including critical care time)  Medications Ordered in  ED Medications - No data to display   Initial Impression / Assessment and Plan / ED Course  I have reviewed the triage vital signs and the nursing notes.  Pertinent labs & imaging results that were available during my care of the patient were reviewed by me and considered in my medical decision making (see chart for details).   patient here for evaluation of shortness of breath that seems to be intermittent in nature with intermittent upper airway noises transmitted. Additional history available upon family's arrival. Family states that since she has been in the hospital she has been profoundly weak with significant dyspnea on exertion. They state that she often collapses when she tries to sit and the concern for her safety at home. They do report intermittent cough at times. No hematochezia or melena.   CBC does demonstrate anemia, no evidence of active bleeding on examination. Her INR is active, and BMP with stable renal insufficiency. Question infiltrate on her chest x-ray, will treat for potential pneumonia in setting of progressive dyspnea. Hospitalist consulted for observation given her symptoms.  Final Clinical Impressions(s) / ED Diagnoses   Final diagnoses:  None    New Prescriptions New Prescriptions   No medications on file     Quintella Reichert, MD 03/18/17 1701

## 2017-03-18 NOTE — ED Notes (Signed)
Attempted report x1. 

## 2017-03-19 ENCOUNTER — Observation Stay (HOSPITAL_COMMUNITY): Payer: Medicare HMO

## 2017-03-19 DIAGNOSIS — R0609 Other forms of dyspnea: Secondary | ICD-10-CM | POA: Diagnosis not present

## 2017-03-19 DIAGNOSIS — E119 Type 2 diabetes mellitus without complications: Secondary | ICD-10-CM | POA: Diagnosis not present

## 2017-03-19 DIAGNOSIS — I272 Pulmonary hypertension, unspecified: Secondary | ICD-10-CM | POA: Diagnosis not present

## 2017-03-19 DIAGNOSIS — I10 Essential (primary) hypertension: Secondary | ICD-10-CM | POA: Diagnosis not present

## 2017-03-19 DIAGNOSIS — I480 Paroxysmal atrial fibrillation: Secondary | ICD-10-CM | POA: Diagnosis not present

## 2017-03-19 DIAGNOSIS — R0602 Shortness of breath: Secondary | ICD-10-CM | POA: Diagnosis not present

## 2017-03-19 LAB — CBC
HEMATOCRIT: 26.4 % — AB (ref 36.0–46.0)
HEMOGLOBIN: 7.9 g/dL — AB (ref 12.0–15.0)
MCH: 23.7 pg — ABNORMAL LOW (ref 26.0–34.0)
MCHC: 29.9 g/dL — AB (ref 30.0–36.0)
MCV: 79.3 fL (ref 78.0–100.0)
Platelets: 364 10*3/uL (ref 150–400)
RBC: 3.33 MIL/uL — ABNORMAL LOW (ref 3.87–5.11)
RDW: 15.3 % (ref 11.5–15.5)
WBC: 5.3 10*3/uL (ref 4.0–10.5)

## 2017-03-19 LAB — PROTIME-INR
INR: 3
PROTHROMBIN TIME: 31.8 s — AB (ref 11.4–15.2)

## 2017-03-19 LAB — BASIC METABOLIC PANEL
ANION GAP: 9 (ref 5–15)
BUN: 38 mg/dL — AB (ref 6–20)
CALCIUM: 8.6 mg/dL — AB (ref 8.9–10.3)
CO2: 26 mmol/L (ref 22–32)
Chloride: 103 mmol/L (ref 101–111)
Creatinine, Ser: 2.03 mg/dL — ABNORMAL HIGH (ref 0.44–1.00)
GFR calc Af Amer: 25 mL/min — ABNORMAL LOW (ref >60)
GFR, EST NON AFRICAN AMERICAN: 21 mL/min — AB (ref >60)
GLUCOSE: 121 mg/dL — AB (ref 65–99)
POTASSIUM: 3.7 mmol/L (ref 3.5–5.1)
SODIUM: 138 mmol/L (ref 135–145)

## 2017-03-19 LAB — GLUCOSE, CAPILLARY
GLUCOSE-CAPILLARY: 113 mg/dL — AB (ref 65–99)
GLUCOSE-CAPILLARY: 131 mg/dL — AB (ref 65–99)
Glucose-Capillary: 142 mg/dL — ABNORMAL HIGH (ref 65–99)

## 2017-03-19 LAB — STREP PNEUMONIAE URINARY ANTIGEN: Strep Pneumo Urinary Antigen: NEGATIVE

## 2017-03-19 LAB — HIV ANTIBODY (ROUTINE TESTING W REFLEX): HIV Screen 4th Generation wRfx: NONREACTIVE

## 2017-03-19 MED ORDER — SODIUM CHLORIDE 0.9 % IV SOLN
510.0000 mg | Freq: Once | INTRAVENOUS | Status: AC
  Administered 2017-03-19: 510 mg via INTRAVENOUS
  Filled 2017-03-19 (×2): qty 17

## 2017-03-19 MED ORDER — ACETAMINOPHEN 325 MG PO TABS
650.0000 mg | ORAL_TABLET | Freq: Four times a day (QID) | ORAL | Status: DC | PRN
  Administered 2017-03-20: 650 mg via ORAL
  Filled 2017-03-19: qty 2

## 2017-03-19 MED ORDER — FUROSEMIDE 10 MG/ML IJ SOLN
40.0000 mg | Freq: Once | INTRAMUSCULAR | Status: AC
  Administered 2017-03-19: 40 mg via INTRAVENOUS
  Filled 2017-03-19: qty 4

## 2017-03-19 MED ORDER — FUROSEMIDE 80 MG PO TABS
80.0000 mg | ORAL_TABLET | Freq: Every evening | ORAL | Status: DC
  Administered 2017-03-19: 80 mg via ORAL
  Filled 2017-03-19: qty 1

## 2017-03-19 NOTE — Evaluation (Signed)
Physical Therapy Evaluation Patient Details Name: [name] [name] MRN: [id number] DOB: 1931/09/12 Today's Date: 03/19/2017   History of Present Illness  Patient is a 83 y/o female who presents with complaints of increasing shortness of breath with activity. Pt previous hospitalization for pacemaker malfunction 03/15/17. PMH includes pacemaker, A-fib, chronic CHF, HTN, DM, CKD, peripheral neuropathy, CAD, PE.  Clinical Impression  Pt admitted with above diagnosis. Pt currently with functional limitations due to the deficits listed below (see PT Problem List). Pt is currently limited by her O2 desaturation with activity that is probable source of recent falls. Pt with complaints of LBP that she thinks may be the result of her last fall. Pt is min guard for transfer and ambulation of 25 feet with RW however SaO2 on RA had dropped to 76% O2 and required vc for pursed lipped breathing to return to 89% O2.    Pt will benefit from skilled PT to increase their independence and safety with mobility to allow discharge to the venue listed below.       Follow Up Recommendations Home health PT;Supervision/Assistance - 24 hour    Equipment Recommendations  None recommended by PT    Recommendations for Other Services       Precautions / Restrictions Precautions Precautions: Fall Precaution Comments: lightheadness Restrictions Weight Bearing Restrictions: No      Mobility  Bed Mobility               General bed mobility comments: in chair at entry  Transfers Overall transfer level: Needs assistance Equipment used: Rolling walker (2 wheeled) Transfers: Sit to/from Stand Sit to Stand: Min guard         General transfer comment: min guard for safety pt reports back pain with coming to upright   Ambulation/Gait Ambulation/Gait assistance: Min guard Ambulation Distance (Feet): 50 Feet Assistive device: Rolling walker (2 wheeled) Gait Pattern/deviations: Step-through  pattern;Decreased stride length;Drifts right/left Gait velocity: decreased Gait velocity interpretation: Below normal speed for age/gender General Gait Details: slow, steady gait, desat with 25 feet of ambulation to 76%O2 on RA        Balance Overall balance assessment: Needs assistance;History of Falls Sitting-balance support: Feet supported;No upper extremity supported Sitting balance-Leahy Scale: Fair     Standing balance support: During functional activity;Bilateral upper extremity supported Standing balance-Leahy Scale: Fair Standing balance comment: requires RW for steadying                              Pertinent Vitals/Pain Pain Assessment: 0-10 Pain Score: 8  Pain Location: LBP Pain Descriptors / Indicators: Grimacing;Guarding;Shooting;Sharp Pain Intervention(s): Monitored during session;Patient requesting pain meds-RN notified;Repositioned    Home Living Family/patient expects to be discharged to:: Private residence Living Arrangements: Alone Available Help at Discharge: Family;Available PRN/intermittently Type of Home: House Home Access: Stairs to enter Entrance Stairs-Rails: Can reach both Entrance Stairs-Number of Steps: 2 Home Layout: One level Home Equipment: Walker - 2 wheels;Walker - 4 wheels;Bedside commode;Tub bench      Prior Function Level of Independence: Independent with assistive device(s)         Comments: Furniture walker at home vs using RW. Does cooking/cleaning.  Daughterassists with grcoery shopping. Reports falls recently.         Extremity/Trunk Assessment   Upper Extremity Assessment Upper Extremity Assessment: Generalized weakness    Lower Extremity Assessment Lower Extremity Assessment: Generalized weakness    Cervical / Trunk Assessment Cervical / Trunk Assessment: Kyphotic  Communication   Communication: No difficulties  Cognition Arousal/Alertness: Awake/alert Behavior During Therapy: WFL for tasks  assessed/performed Overall Cognitive Status: Within Functional Limits for tasks assessed                                        General Comments General comments (skin integrity, edema, etc.): at rest SaO2 on RA 96% O2, HR 67bpm with ambulation of 25 feet pt requested standing rest break SaO2 on RA dropped to 76% O2, HR 88 bpm, SaO2 returned to 89% O2 with pursed lipped breathing, pt returned to room and upon sitting in recliner SaO2 86% O2, with sitting and pursed lipped breathing SaO2 returned to 97% O2. Pt reports that she has been falling recently and recalls that she felt a little light headed before falling. Pt educated in need to always use her RW with ambulation no matter how small the distance and that she needs to be mindful of breathing while she is being active.     Exercises     Assessment/Plan    PT Assessment Patient needs continued PT services  PT Problem List Decreased strength;Decreased activity tolerance;Decreased mobility;Decreased safety awareness;Pain;Cardiopulmonary status limiting activity       PT Treatment Interventions Therapeutic activities;Gait training;Therapeutic exercise;Patient/family education;Balance training;Functional mobility training    PT Goals (Current goals can be found in the Care Plan section)  Acute Rehab PT Goals Patient Stated Goal: to be independent PT Goal Formulation: With patient Time For Goal Achievement: 04/02/17 Potential to Achieve Goals: Fair    Frequency Min 3X/week   Barriers to discharge Decreased caregiver support         AM-PAC PT "6 Clicks" Daily Activity  Outcome Measure Difficulty turning over in bed (including adjusting bedclothes, sheets and blankets)?: None Difficulty moving from lying on back to sitting on the side of the bed? : None Difficulty sitting down on and standing up from a chair with arms (e.g., wheelchair, bedside commode, etc,.)?: None Help needed moving to and from a bed to chair  (including a wheelchair)?: None Help needed walking in hospital room?: A Little Help needed climbing 3-5 steps with a railing? : A Lot 6 Click Score: 21    End of Session Equipment Utilized During Treatment: Gait belt Activity Tolerance: Patient limited by fatigue Patient left: in chair;with call bell/phone within reach;with chair alarm set Nurse Communication: Mobility status;Patient requests pain meds;Precautions (O2 desat with activity) PT Visit Diagnosis: Unsteadiness on feet (R26.81);Dizziness and giddiness (R42)    Time: 7195-9747 PT Time Calculation (min) (ACUTE ONLY): 31 min   Charges:   PT Evaluation $PT Eval Moderate Complexity: 1 Procedure PT Treatments $Gait Training: 8-22 mins   PT G Codes:        Hodge Stachnik B. Migdalia Dk PT, DPT Acute Rehabilitation  [phone] Pager [phone]    [place] 03/19/2017, 9:46 AM

## 2017-03-19 NOTE — Progress Notes (Signed)
Pt feels urge to void. Assisted to commode. Pt can't void. Bladder scan done. Bladder urine amount 147 ml.

## 2017-03-19 NOTE — Progress Notes (Signed)
[facility]  Patient Status: Active (receiving services up to time of hospitalization)  [facility] is providing the following services: PT  If patient discharges after hours, please call [phone].   [name] 03/19/2017, 12:07 PM

## 2017-03-19 NOTE — Progress Notes (Signed)
MD paged for tylenol order.  Will continue to monitor.

## 2017-03-19 NOTE — Progress Notes (Addendum)
PROGRESS NOTE    [name] [name]  GQQ:[id number] DOB: Aug 06, 1931 DOA: 03/18/2017 PCP: Hoyt Koch, MD   Outpatient Specialists:     Brief Narrative:  [name] [name] is a 83 y.o. female with medical history significant of Pulmonary htn, sick sinus syndrome s/p pacer, atrial fibrillation, hypertension, CKD 3, cognitive impairment. Patient presents with dyspnea that has worsened over the last three days. Dyspnea is exertional without associated chest pain or diaphoresis. Improved with resting. She has not done anything else to improve symptoms. Daughter reports a 25 lb weight gain over three months. No wheezing or coughing. No fevers. Patient was recently here for runs of V. tach secondary to pacemaker malfunction. This was adjusted by cardiology at the time. Patient had some dyspnea that time as well after discharge   Assessment & Plan:   Active Problems:   Pulmonary HTN (HCC)   Hypertension   Sick sinus syndrome (HCC)   Chronic diastolic CHF (congestive heart failure) (HCC)   CRD (chronic renal disease), stage III   Dyspnea   Diabetes mellitus type 2, noninsulin dependent (HCC)   A-fib (HCC)   Dyspnea -suspect volume overload-- BNP elevated  D/c abx-- no fever, no WBC -Flutter valve -Incentive spirometer -d-dimer negative -PT eval -lasix- IV then PO -daily weights-- up from 220 in February -on amiodarone so consideration will need to be given to HRCT scan and PFTs; patient also has pulm HTN -may need O2 with ambulation  Paroxysmal atrial fibrillation CHA2DS2-VASc Scoreis 5. Paced rhythm. -Continue amiodarone -Continue metoprolol -Continue warfarin  Sick sinus syndrome S/p pacer  Chronic diastolic heart failure Last EF of 66 5% with grade 2 diastolic dysfunction seen on EF from 03/15/2017. Euvolemic on exam. Blood pressure initially low, but became hypertensive in 671I systolic during exam -continue lasix  Diabetes mellitus Management with  metformin as an outpatient -Sliding-scale insulin -May need to consider not using metformin secondary to renal function  Acute kidney injury on CKD stage III Slightly above baseline of about 1.8. Up to 2.22 on admission -repeat BMP in AM  Anemia Normocytic. FOBT negative. Baseline of around 10. Stable from three days ago -anemia panel shows low FE--- IV fe -CBC in AM- may need transfusion  Cognitive impairment Sounds like patient has dementia. She has been seen as an outpatient by neurology and was prescribed medication, for which she has discontinued using. She does not remember being in hospital a few days prior -needs SNF vs 24 hour supervision   DVT prophylaxis:  Fully anticoagulated   Code Status: Full Code   Family Communication: daughter  Disposition Plan:     Consultants:        Subjective: Does not remember being in the hospital 4 days ago Short of breath   Objective: Vitals:   03/19/17 0041 03/19/17 0458 03/19/17 1044 03/19/17 1153  BP: (!) 107/50 126/78 123/68 109/60  Pulse: 61 60 60 60  Resp: 18 18  20   Temp: 98 F (36.7 C) 97.8 F (36.6 C)  98 F (36.7 C)  TempSrc: Oral Oral  Oral  SpO2: 93% 100%  99%  Weight:  108 kg (238 lb)    Height:        Intake/Output Summary (Last 24 hours) at 03/19/17 1403 Last data filed at 03/19/17 0936  Gross per 24 hour  Intake              480 ml  Output  500 ml  Net              -20 ml   Filed Weights   03/18/17 2007 03/19/17 0458  Weight: 108.2 kg (238 lb 8 oz) 108 kg (238 lb)    Examination:  General exam: Appears calm and comfortable  Respiratory system: few crackles Cardiovascular system: rrr Gastrointestinal system: +BS, soft  Central nervous system: Alert- oriented to person Extremities: moves all 4 ext Skin: No rashes, lesions or ulcers Psychiatry: no SI/no HI- does not appear to have good insight    Data Reviewed: I have personally reviewed following labs and  imaging studies  CBC:  Recent Labs Lab 03/14/17 1005 03/15/17 0507 03/18/17 1508 03/19/17 0252  WBC 4.4 4.3 6.8 5.3  NEUTROABS 3.2  --  5.3  --   HGB 9.7* 8.6* 8.8* 7.9*  HCT 33.6* 28.9* 29.8* 26.4*  MCV 81.4 80.3 80.1 79.3  PLT 430* 390 436* 419   Basic Metabolic Panel:  Recent Labs Lab 03/14/17 1005 03/15/17 0507 03/18/17 1508 03/19/17 0252  NA 137 139 137 138  K 4.7 3.8 4.1 3.7  CL 98* 101 101 103  CO2 26 28 27 26   GLUCOSE 181* 125* 120* 121*  BUN 31* 32* 40* 38*  CREATININE 2.18* 2.02* 2.22* 2.03*  CALCIUM 9.2 8.7* 9.0 8.6*   GFR: Estimated Creatinine Clearance: 28.4 mL/min (A) (by C-G formula based on SCr of 2.03 mg/dL (H)). Liver Function Tests:  Recent Labs Lab 03/14/17 1005 03/18/17 1508  AST 27 19  ALT 16 13*  ALKPHOS 125 115  BILITOT 0.7 0.7  PROT 7.7 7.4  ALBUMIN 3.4* 3.4*   No results for input(s): LIPASE, AMYLASE in the last 168 hours. No results for input(s): AMMONIA in the last 168 hours. Coagulation Profile:  Recent Labs Lab 03/14/17 1005 03/15/17 0507 03/18/17 1508 03/19/17 0252  INR 2.50 2.35 2.67 3.00   Cardiac Enzymes: No results for input(s): CKTOTAL, CKMB, CKMBINDEX, TROPONINI in the last 168 hours. BNP (last 3 results) No results for input(s): PROBNP in the last 8760 hours. HbA1C: No results for input(s): HGBA1C in the last 72 hours. CBG:  Recent Labs Lab 03/15/17 0613 03/15/17 1200 03/18/17 2011 03/19/17 0738 03/19/17 1129  GLUCAP 127* 147* 104* 113* 142*   Lipid Profile: No results for input(s): CHOL, HDL, LDLCALC, TRIG, CHOLHDL, LDLDIRECT in the last 72 hours. Thyroid Function Tests: No results for input(s): TSH, T4TOTAL, FREET4, T3FREE, THYROIDAB in the last 72 hours. Anemia Panel:  Recent Labs  03/18/17 2047  VITAMINB12 623  FOLATE 35.1  FERRITIN 7*  TIBC 543*  IRON 17*  RETICCTPCT 1.5   Urine analysis:    Component Value Date/Time   COLORURINE YELLOW 03/14/2017 1320   APPEARANCEUR CLEAR  03/14/2017 1320   APPEARANCEUR Clear 01/06/2017 1601   LABSPEC 1.011 03/14/2017 1320   LABSPEC 1.008 11/30/2011 0748   PHURINE 7.0 03/14/2017 1320   GLUCOSEU NEGATIVE 03/14/2017 1320   GLUCOSEU NEGATIVE 10/22/2016 1628   HGBUR NEGATIVE 03/14/2017 1320   BILIRUBINUR NEGATIVE 03/14/2017 1320   BILIRUBINUR Negative 01/06/2017 1601   BILIRUBINUR Negative 11/30/2011 0748   KETONESUR NEGATIVE 03/14/2017 1320   PROTEINUR NEGATIVE 03/14/2017 1320   UROBILINOGEN 0.2 10/22/2016 1628   NITRITE NEGATIVE 03/14/2017 1320   LEUKOCYTESUR NEGATIVE 03/14/2017 1320   LEUKOCYTESUR Negative 01/06/2017 1601   LEUKOCYTESUR 3+ 11/30/2011 0748     )No results found for this or any previous visit (from the past 240 hour(s)).    Anti-infectives  Start     Dose/Rate Route Frequency Ordered Stop   03/18/17 1715  doxycycline (VIBRA-TABS) tablet 100 mg     100 mg Oral  Once 03/18/17 1704 03/18/17 1715       Radiology Studies: Dg Chest 2 View  Result Date: 03/18/2017 CLINICAL DATA:  Acute onset of shortness of breath. Initial encounter. EXAM: CHEST  2 VIEW COMPARISON:  Chest radiograph performed 03/14/2017 FINDINGS: The lungs are well-aerated. Mild retrocardiac airspace opacity raises concern for mild pneumonia, particularly on the lateral view. There is no evidence of focal opacification, pleural effusion or pneumothorax. The heart is mildly enlarged. A pacemaker is noted at the left chest wall, with leads ending at the right atrium and right ventricle. No acute osseous abnormalities are seen. Mild chronic right-sided rib deformities are noted. A moderate to large hiatal hernia is noted. IMPRESSION: 1. Mild retrocardiac airspace opacity raises concern for mild pneumonia, particularly on the lateral view. 2. Moderate to large hiatal hernia noted. Electronically Signed   By: Garald Balding M.D.   On: 03/18/2017 16:24   Dg Chest Port 1 View  Result Date: 03/19/2017 CLINICAL DATA:  Shortness of Breath EXAM:  PORTABLE CHEST 1 VIEW COMPARISON:  03/18/2017 FINDINGS: Cardiac shadow remains enlarged. A pacing device is again seen. Large hiatal hernia is again seen. The lungs are well aerated bilaterally. Old rib fractures are noted on the right. IMPRESSION: No acute abnormality noted. Electronically Signed   By: Inez Catalina M.D.   On: 03/19/2017 07:30        Scheduled Meds: . amiodarone  200 mg Oral Daily  . furosemide  80 mg Oral q morning - 10a  . furosemide  80 mg Oral QPM  . gabapentin  600 mg Oral Daily  . metoprolol succinate  25 mg Oral Daily  . pyridOXINE  200 mg Oral Daily  . vitamin B-12  500 mcg Oral Daily  . warfarin  2.5 mg Oral q1800  . Warfarin - Pharmacist Dosing Inpatient   Does not apply q1800   Continuous Infusions: . ferumoxytol       LOS: 0 days    Time spent: 25 min    [place], DO [facility] Pager [phone]  If 7PM-7AM, please contact night-coverage [email or web address] Password [email or web address] 03/19/2017, 2:03 PM

## 2017-03-19 NOTE — Care Management Note (Signed)
Case Management Note  Patient Details  Name: [name] [name] MRN: [id number] Date of Birth: 1930-12-13  Subjective/Objective:    Admitted with Dyspnea               Action/Plan: PCP: Hoyt Koch, MD; has private insurance with [facility] with prescription drug coverage; patient is active with [place] as prior to admission; Physical Therapy to eval patient again for disposition needs; CM will continue to follow  Expected Discharge Date:    possibly 03/24/2017              Expected Discharge Plan:  [place]  Discharge planning Services  CM Consult  HH Arranged:  PT, RN [facility] Agency:  [place]  Status of Service:  In process, will continue to follow  Sherrilyn Rist [phone] 03/19/2017, 12:44 PM

## 2017-03-19 NOTE — Progress Notes (Signed)
ANTICOAGULATION CONSULT NOTE - Follow Up Consult  Pharmacy Consult for Coumadin Indication: atrial fibrillation  Allergies  Allergen Reactions  . Flecainide Shortness Of Breath and Other (See Comments)    toxicity  . Morphine And Related Other (See Comments)    Family requests no morphine due to past reaction. Pt stated it made her sick.  . Cephalexin Nausea And Vomiting    Patient Measurements: Height: 6' (182.9 cm) Weight: 238 lb (108 kg) IBW/kg (Calculated) : 73.1  Vital Signs: Temp: 98 F (36.7 C) (06/20 1153) Temp Source: Oral (06/20 1153) BP: 109/60 (06/20 1153) Pulse Rate: 60 (06/20 1153)  Labs:  Recent Labs  03/18/17 1508 03/19/17 0252  HGB 8.8* 7.9*  HCT 29.8* 26.4*  PLT 436* 364  LABPROT 28.9* 31.8*  INR 2.67 3.00  CREATININE 2.22* 2.03*    Estimated Creatinine Clearance: 28.4 mL/min (A) (by C-G formula based on SCr of 2.03 mg/dL (H)).  Assessment:  83 yr old female continues on Coumadin for atrial fibrillation.  INR therapeutic on admit last night (2.67) and remains upper-range therapeutic today (3.0).  On amiodarone as prior to admission.  Hgb down some, no bleeding reported.  Iron studies low -> Feraheme 510 mg IV x 1 given today.    Home Coumadin regimen:  2.5 mg daily (using 5 mg tablets)  Goal of Therapy:  INR 2-3 Monitor platelets by anticoagulation protocol: Yes   Plan:   Continue Coumadin 2.5 mg daily.  Daily PT/INR for now.  Will consider holding Coumadin on 6/21 if INR trends up again.  Monitor for any bleeding.  Arty Baumgartner, [place] Pager: [phone] 03/19/2017,4:19 PM

## 2017-03-20 ENCOUNTER — Observation Stay (HOSPITAL_COMMUNITY): Payer: Medicare HMO

## 2017-03-20 DIAGNOSIS — I517 Cardiomegaly: Secondary | ICD-10-CM | POA: Diagnosis not present

## 2017-03-20 DIAGNOSIS — R0602 Shortness of breath: Secondary | ICD-10-CM | POA: Diagnosis not present

## 2017-03-20 DIAGNOSIS — I272 Pulmonary hypertension, unspecified: Secondary | ICD-10-CM | POA: Diagnosis not present

## 2017-03-20 DIAGNOSIS — R0609 Other forms of dyspnea: Secondary | ICD-10-CM | POA: Diagnosis not present

## 2017-03-20 DIAGNOSIS — E119 Type 2 diabetes mellitus without complications: Secondary | ICD-10-CM | POA: Diagnosis not present

## 2017-03-20 DIAGNOSIS — I480 Paroxysmal atrial fibrillation: Secondary | ICD-10-CM | POA: Diagnosis not present

## 2017-03-20 DIAGNOSIS — I10 Essential (primary) hypertension: Secondary | ICD-10-CM | POA: Diagnosis not present

## 2017-03-20 LAB — CBC
HEMATOCRIT: 30 % — AB (ref 36.0–46.0)
Hemoglobin: 8.9 g/dL — ABNORMAL LOW (ref 12.0–15.0)
MCH: 23.8 pg — ABNORMAL LOW (ref 26.0–34.0)
MCHC: 29.7 g/dL — ABNORMAL LOW (ref 30.0–36.0)
MCV: 80.2 fL (ref 78.0–100.0)
Platelets: 398 10*3/uL (ref 150–400)
RBC: 3.74 MIL/uL — AB (ref 3.87–5.11)
RDW: 15.5 % (ref 11.5–15.5)
WBC: 3.7 10*3/uL — AB (ref 4.0–10.5)

## 2017-03-20 LAB — BASIC METABOLIC PANEL
ANION GAP: 10 (ref 5–15)
BUN: 38 mg/dL — ABNORMAL HIGH (ref 6–20)
CALCIUM: 8.9 mg/dL (ref 8.9–10.3)
CO2: 27 mmol/L (ref 22–32)
Chloride: 101 mmol/L (ref 101–111)
Creatinine, Ser: 2.1 mg/dL — ABNORMAL HIGH (ref 0.44–1.00)
GFR, EST AFRICAN AMERICAN: 24 mL/min — AB (ref >60)
GFR, EST NON AFRICAN AMERICAN: 21 mL/min — AB (ref >60)
GLUCOSE: 125 mg/dL — AB (ref 65–99)
POTASSIUM: 3.4 mmol/L — AB (ref 3.5–5.1)
Sodium: 138 mmol/L (ref 135–145)

## 2017-03-20 LAB — GLUCOSE, CAPILLARY: GLUCOSE-CAPILLARY: 193 mg/dL — AB (ref 65–99)

## 2017-03-20 LAB — PROTIME-INR
INR: 2.84
Prothrombin Time: 30.5 seconds — ABNORMAL HIGH (ref 11.4–15.2)

## 2017-03-20 LAB — MAGNESIUM: Magnesium: 2.2 mg/dL (ref 1.7–2.4)

## 2017-03-20 MED ORDER — POTASSIUM CHLORIDE CRYS ER 20 MEQ PO TBCR
40.0000 meq | EXTENDED_RELEASE_TABLET | Freq: Once | ORAL | Status: AC
  Administered 2017-03-20: 40 meq via ORAL
  Filled 2017-03-20: qty 2

## 2017-03-20 MED ORDER — FUROSEMIDE 80 MG PO TABS
80.0000 mg | ORAL_TABLET | Freq: Two times a day (BID) | ORAL | Status: DC
  Administered 2017-03-20 – 2017-03-22 (×4): 80 mg via ORAL
  Filled 2017-03-20 (×4): qty 1

## 2017-03-20 MED ORDER — FUROSEMIDE 10 MG/ML IJ SOLN: 80.0000 mg | Freq: Two times a day (BID) | INTRAMUSCULAR | Status: DC

## 2017-03-20 NOTE — Progress Notes (Signed)
Addition of omitted G-codes    21-Mar-2017 0915  PT G-Codes **NOT FOR INPATIENT CLASS**  Functional Assessment Tool Used AM-PAC 6 Clicks Basic Mobility  Functional Limitation Mobility: Walking and moving around  Mobility: Walking and Moving Around Current Status (B3010) CJ  Mobility: Walking and Moving Around Goal Status (A0459) CI

## 2017-03-20 NOTE — Discharge Instructions (Signed)

## 2017-03-20 NOTE — Progress Notes (Addendum)
Family Conference with patient's daughter [name] and her friend concerning DCP. Her daughter works full time and assist her as needed, unable to provide 24 hour care as needed. Judson Roch SW talked to the family and is working on short term SNF placement (if approved). [name] stated that she is waiting for her brother to arrive to talk to him about DCP; she plans to apply for Medicaid; private sitter list given to the daughter; Aneta Mins [phone]  4:10 pm- Judson Roch SW stated that patient's insurance will not approve short term SNF; SW and CM talked to patient and family again about DCP;   Patient is to go home at discharge [facility] services - RN/ PT/aide/SW *they have a family friend / LPN that can provide additional support in the home. * list of private sitters given to the daughter *patient has [employer] ( medical support system, a necklace around her neck for her to push in emergencies) *Daughter plans to apply for Medicaid and she will assist in her care as needed. Patient stated " I don't need anyone to stay with me 24 hr a day, I am not crazy, I can do for myself." Lots of emotional support given.Mindi Slicker [facility] [phone]

## 2017-03-20 NOTE — Clinical Social Work Note (Signed)
CSW discussed case with Surveyor, quantity of social work. Due to patient walking 50 feet min guard and PT recommending HHPT, insurance will not approve SNF. Surveyor, quantity of social work will not approve LOG. Patient and family notified and cannot private pay for SNF. RNCM aware and has discussed home health options with patient and her daughter.  CSW signing off. Consult again if any other social work needs arise.  Dayton Scrape, [place]

## 2017-03-20 NOTE — Progress Notes (Signed)
ANTICOAGULATION CONSULT NOTE - Follow Up Consult  Pharmacy Consult for Coumadin Indication: atrial fibrillation  Allergies  Allergen Reactions  . Flecainide Shortness Of Breath and Other (See Comments)    toxicity  . Morphine And Related Other (See Comments)    Family requests no morphine due to past reaction. Pt stated it made her sick.  . Cephalexin Nausea And Vomiting    Patient Measurements: Height: 6' (182.9 cm) Weight: 235 lb 9.6 oz (106.9 kg) (c scale) IBW/kg (Calculated) : 73.1  Vital Signs: Temp: 98 F (36.7 C) (06/21 1227) Temp Source: Oral (06/21 1227) BP: 104/65 (06/21 1227) Pulse Rate: 75 (06/21 1227)  Labs:  Recent Labs  03/18/17 1508 03/19/17 0252 03/20/17 0337  HGB 8.8* 7.9* 8.9*  HCT 29.8* 26.4* 30.0*  PLT 436* 364 398  LABPROT 28.9* 31.8* 30.5*  INR 2.67 3.00 2.84  CREATININE 2.22* 2.03* 2.10*    Estimated Creatinine Clearance: 27.3 mL/min (A) (by C-G formula based on SCr of 2.1 mg/dL (H)).  Assessment:  83 yr old female continues on Coumadin for atrial fibrillation.  INR therapeutic (2.84) on Coumadin 2.5 mg daily.  On amiodarone as prior to admission. CBC stable.  Iron studies low -> Feraheme 510 mg IV x 1 given 6/20.    Home Coumadin regimen:  2.5 mg daily (using 5 mg tablets)  Goal of Therapy:  INR 2-3 Monitor platelets by anticoagulation protocol: Yes   Plan:   Continue Coumadin 2.5 mg daily.  Daily PT/INR for now.  Monitor for any bleeding.  Arty Baumgartner, [place] Pager: [phone] 03/20/2017,1:29 PM

## 2017-03-20 NOTE — Progress Notes (Signed)
SATURATION QUALIFICATIONS: (This note is used to comply with regulatory documentation for home oxygen)  Patient Saturations on Room Air at Rest = 93%  Patient Saturations on Room Air while Ambulating = 78%  Patient Saturations on 2 Liters of oxygen while Ambulating = 93%  Please briefly explain why patient needs home oxygen:  Pt desatted to 78% while ambulating on room air.

## 2017-03-20 NOTE — Progress Notes (Addendum)
PROGRESS NOTE    [name] [name]  IRW:[id number] DOB: [birth year] DOA: 03/18/2017 PCP: Hoyt Koch, MD   Outpatient Specialists:     Brief Narrative:  [name] [name] is a 83 y.o. female with medical history significant of Pulmonary htn, sick sinus syndrome s/p pacer, atrial fibrillation, hypertension, CKD 3, cognitive impairment. Patient presents with dyspnea that has worsened over the last three days. Dyspnea is exertional without associated chest pain or diaphoresis. Improved with resting. She has not done anything else to improve symptoms. Daughter reports a 25 lb weight gain over three months. No wheezing or coughing. No fevers. Patient was recently here for runs of V. tach secondary to pacemaker malfunction. This was adjusted by cardiology at the time. Patient had some dyspnea that time as well after discharge   Assessment & Plan:   Active Problems:   Pulmonary HTN (HCC)   Hypertension   Sick sinus syndrome (HCC)   Chronic diastolic CHF (congestive heart failure) (HCC)   CRD (chronic renal disease), stage III   Dyspnea   Diabetes mellitus type 2, noninsulin dependent (HCC)   A-fib (HCC)   Dyspnea -suspect volume overload-- BNP elevated  D/c abx-- no fever, no WBC -Flutter valve -Incentive spirometer -d-dimer negative -PT eval- home health with 24 hour supervision which the patient does not have-- discussed with daughter and care management -lasix- IV given-- now increase dose to 80 BID as this is what PCP recommended in May -daily weights-- up from 220 in February -on amiodarone so consideration will need to be given to HRCT scan and PFTs; patient also has pulm HTN which may be contributring -may need O2 at home with ambulation -repeat home O2 study now as patient has diuresed -I/Os do not appear to be accurate -weight down: 4lbs since admission  Addendum: patient is still de-satting more than what I expect with ambulation-- will get high res CT Scan to  evaluate lungs  Paroxysmal atrial fibrillation CHA2DS2-VASc Scoreis 5. Paced rhythm. -Continue amiodarone -Continue metoprolol -Continue warfarin  Sick sinus syndrome S/p pacer  Chronic diastolic heart failure Last EF of 66 5% with grade 2 diastolic dysfunction seen on EF from 03/15/2017.  -continue lasix  Diabetes mellitus Management with metformin as an outpatient -Sliding-scale insulin -May need to consider not using metformin secondary to renal function  Acute kidney injury on CKD stage III Slightly above baseline of about 1.8. -repeat BMP in AM as this increase may be new baseline  Anemia Normocytic. FOBT negative. Baseline of around 10. Stable from three days ago -anemia panel shows low FE--- IV fe -CBC daily  Cognitive impairment Sounds like patient has dementia. She has been seen as an outpatient by neurology and was prescribed medication, for which she has discontinued using. She does not remember being in hospital a few days prior -needs SNF vs 24 hour supervision- care management consult along with social worker for safe d/c plan  Hypokalemia -replace  DVT prophylaxis:  Fully anticoagulated   Code Status: Full Code   Family Communication: Daughter on phone  Disposition Plan:     Consultants:        Subjective: Says she just does not feel well today   Objective: Vitals:   03/19/17 2125 03/20/17 0533 03/20/17 0841 03/20/17 1227  BP: (!) 145/64 (!) 118/54 104/76 104/65  Pulse: (!) 59 (!) 55 72 75  Resp: 17   18  Temp: 97.9 F (36.6 C) 98.2 F (36.8 C)  98 F (36.7 C)  TempSrc: Oral Oral  Oral  SpO2: 93% 95% 95% 96%  Weight:  106.9 kg (235 lb 9.6 oz)    Height:        Intake/Output Summary (Last 24 hours) at 03/20/17 1434 Last data filed at 03/20/17 1213  Gross per 24 hour  Intake              600 ml  Output             1775 ml  Net            -1175 ml   Filed Weights   03/18/17 2007 03/19/17 0458 03/20/17 0533    Weight: 108.2 kg (238 lb 8 oz) 108 kg (238 lb) 106.9 kg (235 lb 9.6 oz)    Examination:  General exam: in chair, no increased work of breathing, not on O2 Respiratory system: diminished at bases, no wheezing Cardiovascular system: rrr, +LE edema non pitting Gastrointestinal system: +BS, soft Extremities: moves all 4 ext Psychiatry: no SI/no HI- does not appear to have good insight into disease process    Data Reviewed: I have personally reviewed following labs and imaging studies  CBC:  Recent Labs Lab 03/14/17 1005 03/15/17 0507 03/18/17 1508 03/19/17 0252 03/20/17 0337  WBC 4.4 4.3 6.8 5.3 3.7*  NEUTROABS 3.2  --  5.3  --   --   HGB 9.7* 8.6* 8.8* 7.9* 8.9*  HCT 33.6* 28.9* 29.8* 26.4* 30.0*  MCV 81.4 80.3 80.1 79.3 80.2  PLT 430* 390 436* 364 629   Basic Metabolic Panel:  Recent Labs Lab 03/14/17 1005 03/15/17 0507 03/18/17 1508 03/19/17 0252 03/20/17 0337  NA 137 139 137 138 138  K 4.7 3.8 4.1 3.7 3.4*  CL 98* 101 101 103 101  CO2 26 28 27 26 27   GLUCOSE 181* 125* 120* 121* 125*  BUN 31* 32* 40* 38* 38*  CREATININE 2.18* 2.02* 2.22* 2.03* 2.10*  CALCIUM 9.2 8.7* 9.0 8.6* 8.9  MG  --   --   --   --  2.2   GFR: Estimated Creatinine Clearance: 27.3 mL/min (A) (by C-G formula based on SCr of 2.1 mg/dL (H)). Liver Function Tests:  Recent Labs Lab 03/14/17 1005 03/18/17 1508  AST 27 19  ALT 16 13*  ALKPHOS 125 115  BILITOT 0.7 0.7  PROT 7.7 7.4  ALBUMIN 3.4* 3.4*   No results for input(s): LIPASE, AMYLASE in the last 168 hours. No results for input(s): AMMONIA in the last 168 hours. Coagulation Profile:  Recent Labs Lab 03/14/17 1005 03/15/17 0507 03/18/17 1508 03/19/17 0252 03/20/17 0337  INR 2.50 2.35 2.67 3.00 2.84   Cardiac Enzymes: No results for input(s): CKTOTAL, CKMB, CKMBINDEX, TROPONINI in the last 168 hours. BNP (last 3 results) No results for input(s): PROBNP in the last 8760 hours. HbA1C: No results for input(s): HGBA1C  in the last 72 hours. CBG:  Recent Labs Lab 03/18/17 2011 03/19/17 0738 03/19/17 1129 03/19/17 1608 03/19/17 2057  GLUCAP 104* 113* 142* 131* 193*   Lipid Profile: No results for input(s): CHOL, HDL, LDLCALC, TRIG, CHOLHDL, LDLDIRECT in the last 72 hours. Thyroid Function Tests: No results for input(s): TSH, T4TOTAL, FREET4, T3FREE, THYROIDAB in the last 72 hours. Anemia Panel:  Recent Labs  03/18/17 2047  VITAMINB12 623  FOLATE 35.1  FERRITIN 7*  TIBC 543*  IRON 17*  RETICCTPCT 1.5   Urine analysis:    Component Value Date/Time   COLORURINE YELLOW 03/14/2017 1320   APPEARANCEUR CLEAR 03/14/2017 1320   APPEARANCEUR  Clear 01/06/2017 1601   LABSPEC 1.011 03/14/2017 1320   LABSPEC 1.008 11/30/2011 0748   PHURINE 7.0 03/14/2017 1320   GLUCOSEU NEGATIVE 03/14/2017 1320   GLUCOSEU NEGATIVE 10/22/2016 1628   HGBUR NEGATIVE 03/14/2017 1320   BILIRUBINUR NEGATIVE 03/14/2017 1320   BILIRUBINUR Negative 01/06/2017 1601   BILIRUBINUR Negative 11/30/2011 0748   KETONESUR NEGATIVE 03/14/2017 1320   PROTEINUR NEGATIVE 03/14/2017 1320   UROBILINOGEN 0.2 10/22/2016 1628   NITRITE NEGATIVE 03/14/2017 1320   LEUKOCYTESUR NEGATIVE 03/14/2017 1320   LEUKOCYTESUR Negative 01/06/2017 1601   LEUKOCYTESUR 3+ 11/30/2011 0748     ) Recent Results (from the past 240 hour(s))  Culture, blood (routine x 2) Call MD if unable to obtain prior to antibiotics being given     Status: None (Preliminary result)   Collection Time: 03/18/17  8:47 PM  Result Value Ref Range Status   Specimen Description BLOOD RIGHT ANTECUBITAL  Final   Special Requests   Final    BOTTLES DRAWN AEROBIC ONLY Blood Culture adequate volume   Culture NO GROWTH < 24 HOURS  Final   Report Status PENDING  Incomplete  Culture, blood (routine x 2) Call MD if unable to obtain prior to antibiotics being given     Status: None (Preliminary result)   Collection Time: 03/18/17  8:56 PM  Result Value Ref Range Status    Specimen Description BLOOD RIGHT HAND  Final   Special Requests IN PEDIATRIC BOTTLE Blood Culture adequate volume  Final   Culture NO GROWTH < 24 HOURS  Final   Report Status PENDING  Incomplete      Anti-infectives    Start     Dose/Rate Route Frequency Ordered Stop   03/18/17 1715  doxycycline (VIBRA-TABS) tablet 100 mg     100 mg Oral  Once 03/18/17 1704 03/18/17 1715       Radiology Studies: Dg Chest 2 View  Result Date: 03/18/2017 CLINICAL DATA:  Acute onset of shortness of breath. Initial encounter. EXAM: CHEST  2 VIEW COMPARISON:  Chest radiograph performed 03/14/2017 FINDINGS: The lungs are well-aerated. Mild retrocardiac airspace opacity raises concern for mild pneumonia, particularly on the lateral view. There is no evidence of focal opacification, pleural effusion or pneumothorax. The heart is mildly enlarged. A pacemaker is noted at the left chest wall, with leads ending at the right atrium and right ventricle. No acute osseous abnormalities are seen. Mild chronic right-sided rib deformities are noted. A moderate to large hiatal hernia is noted. IMPRESSION: 1. Mild retrocardiac airspace opacity raises concern for mild pneumonia, particularly on the lateral view. 2. Moderate to large hiatal hernia noted. Electronically Signed   By: Garald Balding M.D.   On: 03/18/2017 16:24   Dg Chest Port 1 View  Result Date: 03/19/2017 CLINICAL DATA:  Shortness of Breath EXAM: PORTABLE CHEST 1 VIEW COMPARISON:  03/18/2017 FINDINGS: Cardiac shadow remains enlarged. A pacing device is again seen. Large hiatal hernia is again seen. The lungs are well aerated bilaterally. Old rib fractures are noted on the right. IMPRESSION: No acute abnormality noted. Electronically Signed   By: Inez Catalina M.D.   On: 03/19/2017 07:30        Scheduled Meds: . amiodarone  200 mg Oral Daily  . furosemide  80 mg Oral q morning - 10a  . furosemide  80 mg Oral QPM  . gabapentin  600 mg Oral Daily  . metoprolol  succinate  25 mg Oral Daily  . pyridOXINE  200 mg Oral  Daily  . vitamin B-12  500 mcg Oral Daily  . warfarin  2.5 mg Oral q1800  . Warfarin - Pharmacist Dosing Inpatient   Does not apply q1800   Continuous Infusions:    LOS: 0 days    Time spent: 25 min    [place], DO [facility] Pager [phone]  If 7PM-7AM, please contact night-coverage [email or web address] Password [facility] 03/20/2017, 2:34 PM

## 2017-03-21 DIAGNOSIS — I130 Hypertensive heart and chronic kidney disease with heart failure and stage 1 through stage 4 chronic kidney disease, or unspecified chronic kidney disease: Secondary | ICD-10-CM | POA: Diagnosis not present

## 2017-03-21 DIAGNOSIS — I272 Pulmonary hypertension, unspecified: Secondary | ICD-10-CM | POA: Diagnosis present

## 2017-03-21 DIAGNOSIS — D649 Anemia, unspecified: Secondary | ICD-10-CM | POA: Diagnosis present

## 2017-03-21 DIAGNOSIS — R0602 Shortness of breath: Secondary | ICD-10-CM | POA: Diagnosis not present

## 2017-03-21 DIAGNOSIS — Z825 Family history of asthma and other chronic lower respiratory diseases: Secondary | ICD-10-CM | POA: Diagnosis not present

## 2017-03-21 DIAGNOSIS — T462X5A Adverse effect of other antidysrhythmic drugs, initial encounter: Secondary | ICD-10-CM | POA: Diagnosis not present

## 2017-03-21 DIAGNOSIS — Z853 Personal history of malignant neoplasm of breast: Secondary | ICD-10-CM | POA: Diagnosis not present

## 2017-03-21 DIAGNOSIS — E1142 Type 2 diabetes mellitus with diabetic polyneuropathy: Secondary | ICD-10-CM | POA: Diagnosis present

## 2017-03-21 DIAGNOSIS — Z8249 Family history of ischemic heart disease and other diseases of the circulatory system: Secondary | ICD-10-CM | POA: Diagnosis not present

## 2017-03-21 DIAGNOSIS — R911 Solitary pulmonary nodule: Secondary | ICD-10-CM | POA: Diagnosis present

## 2017-03-21 DIAGNOSIS — Z950 Presence of cardiac pacemaker: Secondary | ICD-10-CM | POA: Diagnosis not present

## 2017-03-21 DIAGNOSIS — Z87891 Personal history of nicotine dependence: Secondary | ICD-10-CM | POA: Diagnosis not present

## 2017-03-21 DIAGNOSIS — F0390 Unspecified dementia without behavioral disturbance: Secondary | ICD-10-CM | POA: Diagnosis present

## 2017-03-21 DIAGNOSIS — N179 Acute kidney failure, unspecified: Secondary | ICD-10-CM | POA: Diagnosis not present

## 2017-03-21 DIAGNOSIS — R0902 Hypoxemia: Secondary | ICD-10-CM

## 2017-03-21 DIAGNOSIS — I471 Supraventricular tachycardia: Secondary | ICD-10-CM | POA: Diagnosis not present

## 2017-03-21 DIAGNOSIS — N183 Chronic kidney disease, stage 3 (moderate): Secondary | ICD-10-CM | POA: Diagnosis present

## 2017-03-21 DIAGNOSIS — R0609 Other forms of dyspnea: Secondary | ICD-10-CM | POA: Diagnosis not present

## 2017-03-21 DIAGNOSIS — T462X1A Poisoning by other antidysrhythmic drugs, accidental (unintentional), initial encounter: Secondary | ICD-10-CM

## 2017-03-21 DIAGNOSIS — I480 Paroxysmal atrial fibrillation: Secondary | ICD-10-CM | POA: Diagnosis present

## 2017-03-21 DIAGNOSIS — E876 Hypokalemia: Secondary | ICD-10-CM | POA: Diagnosis present

## 2017-03-21 DIAGNOSIS — Z7984 Long term (current) use of oral hypoglycemic drugs: Secondary | ICD-10-CM | POA: Diagnosis not present

## 2017-03-21 DIAGNOSIS — Z86711 Personal history of pulmonary embolism: Secondary | ICD-10-CM | POA: Diagnosis not present

## 2017-03-21 DIAGNOSIS — Z7901 Long term (current) use of anticoagulants: Secondary | ICD-10-CM | POA: Diagnosis not present

## 2017-03-21 DIAGNOSIS — E1122 Type 2 diabetes mellitus with diabetic chronic kidney disease: Secondary | ICD-10-CM | POA: Diagnosis not present

## 2017-03-21 DIAGNOSIS — I5032 Chronic diastolic (congestive) heart failure: Secondary | ICD-10-CM | POA: Diagnosis not present

## 2017-03-21 LAB — BASIC METABOLIC PANEL
Anion gap: 10 (ref 5–15)
BUN: 43 mg/dL — AB (ref 6–20)
CHLORIDE: 103 mmol/L (ref 101–111)
CO2: 26 mmol/L (ref 22–32)
CREATININE: 2.08 mg/dL — AB (ref 0.44–1.00)
Calcium: 9.1 mg/dL (ref 8.9–10.3)
GFR calc Af Amer: 24 mL/min — ABNORMAL LOW (ref >60)
GFR calc non Af Amer: 21 mL/min — ABNORMAL LOW (ref >60)
Glucose, Bld: 122 mg/dL — ABNORMAL HIGH (ref 65–99)
Potassium: 3.9 mmol/L (ref 3.5–5.1)
Sodium: 139 mmol/L (ref 135–145)

## 2017-03-21 LAB — BRAIN NATRIURETIC PEPTIDE: B Natriuretic Peptide: 431.2 pg/mL — ABNORMAL HIGH (ref 0.0–100.0)

## 2017-03-21 LAB — CBC
HEMATOCRIT: 30.3 % — AB (ref 36.0–46.0)
HEMOGLOBIN: 9.2 g/dL — AB (ref 12.0–15.0)
MCH: 23.8 pg — AB (ref 26.0–34.0)
MCHC: 30.4 g/dL (ref 30.0–36.0)
MCV: 78.5 fL (ref 78.0–100.0)
Platelets: 392 10*3/uL (ref 150–400)
RBC: 3.86 MIL/uL — ABNORMAL LOW (ref 3.87–5.11)
RDW: 15.5 % (ref 11.5–15.5)
WBC: 4.8 10*3/uL (ref 4.0–10.5)

## 2017-03-21 LAB — PROTIME-INR
INR: 2.75
PROTHROMBIN TIME: 29.7 s — AB (ref 11.4–15.2)

## 2017-03-21 MED ORDER — PREDNISONE 20 MG PO TABS
40.0000 mg | ORAL_TABLET | Freq: Every day | ORAL | Status: DC
  Administered 2017-03-22: 40 mg via ORAL
  Filled 2017-03-21: qty 2

## 2017-03-21 NOTE — Progress Notes (Signed)
Paged MD regarding pt discharge prior to change of shift, awaiting call back  MD returned paged stated he will "discharge pt tomorrow due to new issues that have arised"   Derold Dorsch Leory Plowman

## 2017-03-21 NOTE — Progress Notes (Addendum)
Physical Therapy Treatment Patient Details Name: [name] [name] MRN: [id number] DOB: 1931-06-07 Today's Date: 03/21/2017    History of Present Illness Patient is a 83 y/o female who presents with complaints of increasing shortness of breath with activity. Pt previous hospitalization for pacemaker malfunction 03/15/17. PMH includes pacemaker, A-fib, chronic CHF, HTN, DM, CKD, peripheral neuropathy, CAD, PE.    PT Comments    Patient required increased time for ambulation due to fatigue and need for rest breaks. Pt able to safely negotiate steps. SpO2 desat on RA with ambulation 86-93%. Pt will need 24 hour assist/supervision upon d/c due to cognitive deficits and increased risk for falls.    Follow Up Recommendations  Home health PT;Supervision/Assistance - 24 hour     Equipment Recommendations  None recommended by PT    Recommendations for Other Services       Precautions / Restrictions Precautions Precautions: Fall Precaution Comments: lightheadness Restrictions Weight Bearing Restrictions: No    Mobility  Bed Mobility Overal bed mobility: Modified Independent Bed Mobility: Supine to Sit           General bed mobility comments: increased effort  Transfers Overall transfer level: Needs assistance Equipment used: Rolling walker (2 wheeled) Transfers: Sit to/from Stand Sit to Stand: Supervision         General transfer comment: supervision for safety  Ambulation/Gait Ambulation/Gait assistance: Min guard   Assistive device: Rolling walker (2 wheeled) Gait Pattern/deviations: Step-through pattern;Decreased stride length;Shuffle;Decreased dorsiflexion - right;Decreased dorsiflexion - left Gait velocity: decreased   General Gait Details: increased time for standing and seated rest breaks; decreased cadence and shuffling gait at times; cues for proximity of RW and increased bilat step length; pt unsteady at times but no LOB   Stairs Stairs: Yes   Stair  Management: One rail Right;Step to pattern;Forwards;Sideways Number of Stairs: 3 General stair comments: min guard for safety; sideways to descend  Wheelchair Mobility    Modified Rankin (Stroke Patients Only)       Balance Overall balance assessment: Needs assistance;History of Falls Sitting-balance support: Feet supported;No upper extremity supported Sitting balance-Leahy Scale: Fair     Standing balance support: During functional activity;Bilateral upper extremity supported Standing balance-Leahy Scale: Fair Standing balance comment: requires RW for steadying                             Cognition Arousal/Alertness: Awake/alert Behavior During Therapy: WFL for tasks assessed/performed Overall Cognitive Status: No family/caregiver present to determine baseline cognitive functioning Area of Impairment: Memory                     Memory: Decreased short-term memory                Exercises      General Comments General comments (skin integrity, edema, etc.): SpO2 down to mid 80s on RA with ambulation and stair negotiation; poor wave form at times so not sure of accuracy       Pertinent Vitals/Pain Pain Assessment: Faces Faces Pain Scale: Hurts little more Pain Location: LBP Pain Descriptors / Indicators: Grimacing;Guarding;Sore Pain Intervention(s): Limited activity within patient's tolerance;Monitored during session;Repositioned    Home Living                      Prior Function            PT Goals (current goals can now be found in the care plan section) Acute  Rehab PT Goals Patient Stated Goal: to be independent PT Goal Formulation: With patient Time For Goal Achievement: 04/02/17 Potential to Achieve Goals: Fair Progress towards PT goals: Progressing toward goals    Frequency    Min 3X/week      PT Plan Current plan remains appropriate    Co-evaluation              AM-PAC PT "6 Clicks" Daily Activity   Outcome Measure  Difficulty turning over in bed (including adjusting bedclothes, sheets and blankets)?: None Difficulty moving from lying on back to sitting on the side of the bed? : None Difficulty sitting down on and standing up from a chair with arms (e.g., wheelchair, bedside commode, etc,.)?: None Help needed moving to and from a bed to chair (including a wheelchair)?: None Help needed walking in hospital room?: A Little Help needed climbing 3-5 steps with a railing? : A Little 6 Click Score: 22    End of Session Equipment Utilized During Treatment: Gait belt Activity Tolerance: Patient limited by fatigue Patient left: with call bell/phone within reach;with nursing/sitter in room;Other (comment) (in restroom with RN present) Nurse Communication: Mobility status;Patient requests pain meds;Precautions (O2 desat with activity) PT Visit Diagnosis: Unsteadiness on feet (R26.81);Dizziness and giddiness (R42)     Time: 6387-5643 PT Time Calculation (min) (ACUTE ONLY): 45 min  Charges:  $Gait Training: 23-37 mins $Therapeutic Activity: 8-22 mins                    G Codes:       [name] [name], PTA Pager: [phone]     Darliss Cheney 03/21/2017, 9:57 AM

## 2017-03-21 NOTE — Progress Notes (Signed)
Talked to MD in regards to discharging pt, he stated he will place orders later Pt sitting up in chair, will continue to monitor  Fia Hebert Leory Plowman

## 2017-03-21 NOTE — Progress Notes (Signed)
PROGRESS NOTE    [name] [name]  QQI:[id number] DOB: 1931/08/07 DOA: 03/18/2017 PCP: Hoyt Koch, MD   Outpatient Specialists:     Brief Narrative:  [name] [name] is a 83 y.o. female with medical history significant of Pulmonary htn, sick sinus syndrome s/p pacer, atrial fibrillation, hypertension, CKD 3, cognitive impairment. Patient presents with dyspnea that has worsened over the last three days. Dyspnea is exertional without associated chest pain or diaphoresis. Improved with resting. She has not done anything else to improve symptoms. Daughter reports a 25 lb weight gain over three months. No wheezing or coughing. No fevers. Patient was recently here for runs of V. tach secondary to pacemaker malfunction. This was adjusted by cardiology at the time. Patient had some dyspnea that time as well after discharge  Pt has CT scan which reported suspected amiodarone toxicity and pulmonary nodule  Assessment & Plan:   Active Problems:   Pulmonary HTN (HCC)   Hypertension   Sick sinus syndrome (HCC)   Chronic diastolic CHF (congestive heart failure) (HCC)   CRD (chronic renal disease), stage III   Dyspnea   Diabetes mellitus type 2, noninsulin dependent (HCC)   A-fib (HCC)   Dyspnea - Prior progress note reviewed - At this point dyspnea could be secondary to worsening lung function secondary to amiodarone toxicity. I have discussed with pulmonary team the CT scan findings and the recommendations are to discontinue amiodarone. Place on prednisone 40 mg for the next few days and taper over the next 2 weeks. Patient is on Lasix and I think at this point she is euvolemic.  Paroxysmal atrial fibrillation CHA2DS2-VASc Scoreis 5. Paced rhythm. -Hold amiodarone and monitor overnight to see how heart rate does. -Continue metoprolol -Continue warfarin  Pulmonary nodule - Discussed with pulmonologist who recommends repeating CT scan in 3-6 months.  Sick sinus  syndrome S/p pacer - EP specialist evaluated  Chronic diastolic heart failure Last EF of 66 5% with grade 2 diastolic dysfunction seen on EF from 03/15/2017.  -continue lasix seems euvolemic  Diabetes mellitus -Continue Sliding-scale insulin -On d/c most likely discontinue metformin given serum creatinine elevated above 1.4  Acute kidney injury on CKD stage III Slightly above baseline of about 1.8. -Continue to monitor serum creatinine. Near baseline  Anemia Normocytic. FOBT negative. Baseline of around 10. Stable from three days ago -anemia panel shows low FE--- IV fe -CBC daily  Cognitive impairment Suspecting dementia. She has been seen as an outpatient by neurology and was prescribed medication, for which she has discontinued using. She does not remember being in hospital a few days prior -Most likely need SNF vs 24 hour supervision  Hypokalemia -replaced and resolved  DVT prophylaxis:  Fully anticoagulated   Code Status: Full Code   Family Communication: Daughter on phone  Disposition Plan:     Consultants:   EP specialist  Subjective: Says she just does not feel well today   Objective: Vitals:   03/20/17 2024 03/21/17 0552 03/21/17 0900 03/21/17 1200  BP: 127/63 118/60 100/72 (!) 103/51  Pulse: 78 68 60 (!) 59  Resp: 18  18 18   Temp: 98.3 F (36.8 C) 98.1 F (36.7 C)  98 F (36.7 C)  TempSrc: Oral Oral  Oral  SpO2: 95% 94%  98%  Weight:  105.7 kg (233 lb 1.6 oz)    Height:        Intake/Output Summary (Last 24 hours) at 03/21/17 1723 Last data filed at 03/21/17 1200  Gross per 24 hour  Intake              600 ml  Output             2495 ml  Net            -1895 ml   Filed Weights   03/19/17 0458 03/20/17 0533 03/21/17 0552  Weight: 108 kg (238 lb) 106.9 kg (235 lb 9.6 oz) 105.7 kg (233 lb 1.6 oz)    Examination:  General exam: Pt is awake and alert, sitting up in chair Respiratory system: diminished at bases, no wheezing,  speaking in full sentences Cardiovascular system:  +LE edema non pitting, rrr Gastrointestinal system: +BS, soft, no guarding Extremities: moves all 4 extremities Psychiatry: no SI/no HI- does not appear to have good insight into disease process, appropriate mood    Data Reviewed: I have personally reviewed following labs and imaging studies  CBC:  Recent Labs Lab 03/15/17 0507 03/18/17 1508 03/19/17 0252 03/20/17 0337 03/21/17 0408  WBC 4.3 6.8 5.3 3.7* 4.8  NEUTROABS  --  5.3  --   --   --   HGB 8.6* 8.8* 7.9* 8.9* 9.2*  HCT 28.9* 29.8* 26.4* 30.0* 30.3*  MCV 80.3 80.1 79.3 80.2 78.5  PLT 390 436* 364 398 379   Basic Metabolic Panel:  Recent Labs Lab 03/15/17 0507 03/18/17 1508 03/19/17 0252 03/20/17 0337 03/21/17 0408  NA 139 137 138 138 139  K 3.8 4.1 3.7 3.4* 3.9  CL 101 101 103 101 103  CO2 28 27 26 27 26   GLUCOSE 125* 120* 121* 125* 122*  BUN 32* 40* 38* 38* 43*  CREATININE 2.02* 2.22* 2.03* 2.10* 2.08*  CALCIUM 8.7* 9.0 8.6* 8.9 9.1  MG  --   --   --  2.2  --    GFR: Estimated Creatinine Clearance: 27.4 mL/min (A) (by C-G formula based on SCr of 2.08 mg/dL (H)). Liver Function Tests:  Recent Labs Lab 03/18/17 1508  AST 19  ALT 13*  ALKPHOS 115  BILITOT 0.7  PROT 7.4  ALBUMIN 3.4*   No results for input(s): LIPASE, AMYLASE in the last 168 hours. No results for input(s): AMMONIA in the last 168 hours. Coagulation Profile:  Recent Labs Lab 03/15/17 0507 03/18/17 1508 03/19/17 0252 03/20/17 0337 03/21/17 0408  INR 2.35 2.67 3.00 2.84 2.75   Cardiac Enzymes: No results for input(s): CKTOTAL, CKMB, CKMBINDEX, TROPONINI in the last 168 hours. BNP (last 3 results) No results for input(s): PROBNP in the last 8760 hours. HbA1C: No results for input(s): HGBA1C in the last 72 hours. CBG:  Recent Labs Lab 03/18/17 2011 03/19/17 0738 03/19/17 1129 03/19/17 1608 03/19/17 2057  GLUCAP 104* 113* 142* 131* 193*   Lipid Profile: No  results for input(s): CHOL, HDL, LDLCALC, TRIG, CHOLHDL, LDLDIRECT in the last 72 hours. Thyroid Function Tests: No results for input(s): TSH, T4TOTAL, FREET4, T3FREE, THYROIDAB in the last 72 hours. Anemia Panel:  Recent Labs  03/18/17 2047  VITAMINB12 623  FOLATE 35.1  FERRITIN 7*  TIBC 543*  IRON 17*  RETICCTPCT 1.5   Urine analysis:    Component Value Date/Time   COLORURINE YELLOW 03/14/2017 1320   APPEARANCEUR CLEAR 03/14/2017 1320   APPEARANCEUR Clear 01/06/2017 1601   LABSPEC 1.011 03/14/2017 1320   LABSPEC 1.008 11/30/2011 0748   PHURINE 7.0 03/14/2017 1320   GLUCOSEU NEGATIVE 03/14/2017 1320   GLUCOSEU NEGATIVE 10/22/2016 1628   HGBUR NEGATIVE 03/14/2017 1320   BILIRUBINUR NEGATIVE 03/14/2017 1320  BILIRUBINUR Negative 01/06/2017 1601   BILIRUBINUR Negative 11/30/2011 0748   KETONESUR NEGATIVE 03/14/2017 1320   PROTEINUR NEGATIVE 03/14/2017 1320   UROBILINOGEN 0.2 10/22/2016 1628   NITRITE NEGATIVE 03/14/2017 1320   LEUKOCYTESUR NEGATIVE 03/14/2017 1320   LEUKOCYTESUR Negative 01/06/2017 1601   LEUKOCYTESUR 3+ 11/30/2011 0748     ) Recent Results (from the past 240 hour(s))  Culture, blood (routine x 2) Call MD if unable to obtain prior to antibiotics being given     Status: None (Preliminary result)   Collection Time: 03/18/17  8:47 PM  Result Value Ref Range Status   Specimen Description BLOOD RIGHT ANTECUBITAL  Final   Special Requests   Final    BOTTLES DRAWN AEROBIC ONLY Blood Culture adequate volume   Culture NO GROWTH 3 DAYS  Final   Report Status PENDING  Incomplete  Culture, blood (routine x 2) Call MD if unable to obtain prior to antibiotics being given     Status: None (Preliminary result)   Collection Time: 03/18/17  8:56 PM  Result Value Ref Range Status   Specimen Description BLOOD RIGHT HAND  Final   Special Requests IN PEDIATRIC BOTTLE Blood Culture adequate volume  Final   Culture NO GROWTH 3 DAYS  Final   Report Status PENDING   Incomplete      Anti-infectives    Start     Dose/Rate Route Frequency Ordered Stop   03/18/17 1715  doxycycline (VIBRA-TABS) tablet 100 mg     100 mg Oral  Once 03/18/17 1704 03/18/17 1715       Radiology Studies: Ct Chest High Resolution  Result Date: 03/21/2017 CLINICAL DATA:  Inpatient. Hypoxia. Dyspnea. Long-term amiodarone use. EXAM: CT CHEST WITHOUT CONTRAST TECHNIQUE: Multidetector CT imaging of the chest was performed following the standard protocol without intravenous contrast. High resolution imaging of the lungs, as well as inspiratory and expiratory imaging, was performed. COMPARISON:  04/12/2016 chest CT. Chest radiograph from one day prior. FINDINGS: Cardiovascular: Mild cardiomegaly. No significant pericardial fluid/thickening. Two lead left subclavian pacemaker with lead tips in the right atrium and right ventricular apex. Left anterior descending, left circumflex and right coronary atherosclerosis. Atherosclerotic nonaneurysmal thoracic aorta. Top-normal caliber main pulmonary artery (3.4 cm diameter), stable. Mediastinum/Nodes: Stable right thyroid lobe nodules, largest 1.5 cm in the anterior right thyroid lobe. Unremarkable esophagus. No axillary adenopathy. Stable mildly enlarged 1.3 cm subcarinal node (series 5/ image 72). No additional pathologically enlarged mediastinal or gross hilar nodes on this noncontrast scan. Surgical clips are noted in the left axilla. Lungs/Pleura: No pneumothorax. No pleural effusion. Mild centrilobular and paraseptal emphysema with mild diffuse bronchial wall thickening. No acute consolidative airspace disease or lung masses. Irregular 6 mm solid left upper lobe pulmonary nodule (series 6/ image 53), new since 04/12/2016 chest CT. No additional significant pulmonary nodules. No significant lobular air trapping on the expiration sequence. Patchy confluent subpleural reticulation and ground-glass attenuation in both lungs with associated minimal  traction bronchiolectasis, with a dependent basilar predominance to these findings. These findings are mildly progressed since 04/12/2016 chest CT and were absent on the 10/14/2011 chest CT. No frank honeycombing. Upper abdomen: Granulomatous left lower lobe calcification. Moderate to large hiatal hernia. Musculoskeletal: No aggressive appearing focal osseous lesions. Marked thoracic spondylosis. IMPRESSION: 1. 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 Mild interval progression since 04/12/2016 chest CT. These findings were absent  on 10/14/2011 chest CT. Although not specific, these findings are compatible with amiodarone pulmonary toxicity in the correct clinical setting. 2. Irregular 6 mm solid left upper lobe pulmonary nodule, new since 04/12/2016 chest CT, raising suspicion for primary bronchogenic malignancy in this high risk patient with smoking related changes in the lungs. Consensus guidelines recommend a non-contrast chest CT at 6-12 months for a nodule of this size, however I would consider a 3-6 month follow-up noncontrast chest CT given the suspicious morphology. This nodule is too small for PET characterization at this time. 3. Moderate to large hiatal hernia. 4. Mild cardiomegaly. 5. Three-vessel coronary atherosclerosis. Aortic Atherosclerosis (ICD10-I70.0) and Emphysema (ICD10-J43.9). Electronically Signed   By: Ilona Sorrel M.D.   On: 03/21/2017 11:02        Scheduled Meds: . furosemide  80 mg Oral BID  . gabapentin  600 mg Oral Daily  . metoprolol succinate  25 mg Oral Daily  . [START ON 03/22/2017] predniSONE  40 mg Oral Q breakfast  . pyridOXINE  200 mg Oral Daily  . vitamin B-12  500 mcg Oral Daily  . warfarin  2.5 mg Oral q1800  . Warfarin - Pharmacist Dosing Inpatient    Does not apply q1800   Continuous Infusions:    LOS: 0 days    Time spent: > 35 minutes  Velvet Bathe, DO [facility] Pager [phone]  If 7PM-7AM, please contact night-coverage [email or web address] Password [facility] 03/21/2017, 5:23 PM

## 2017-03-21 NOTE — Progress Notes (Signed)
ANTICOAGULATION CONSULT NOTE - Follow Up Consult  Pharmacy Consult for Coumadin Indication: atrial fibrillation  Allergies  Allergen Reactions  . Flecainide Shortness Of Breath and Other (See Comments)    toxicity  . Morphine And Related Other (See Comments)    Family requests no morphine due to past reaction. Pt stated it made her sick.  . Cephalexin Nausea And Vomiting    Patient Measurements: Height: 6' (182.9 cm) Weight: 233 lb 1.6 oz (105.7 kg) (c scale) IBW/kg (Calculated) : 73.1  Vital Signs: Temp: 98.1 F (36.7 C) (06/22 0552) Temp Source: Oral (06/22 0552) BP: 100/72 (06/22 0900) Pulse Rate: 60 (06/22 0900)  Labs:  Recent Labs  03/19/17 0252 03/20/17 0337 03/21/17 0408  HGB 7.9* 8.9* 9.2*  HCT 26.4* 30.0* 30.3*  PLT 364 398 392  LABPROT 31.8* 30.5* 29.7*  INR 3.00 2.84 2.75  CREATININE 2.03* 2.10* 2.08*    Estimated Creatinine Clearance: 27.4 mL/min (A) (by C-G formula based on SCr of 2.08 mg/dL (H)).  Assessment:  83 yr old female continues on Coumadin for atrial fibrillation.  INR 2.75, therapeutic x 4 days on Coumadin 2.5 mg daily.  On amiodarone as prior to admission. CBC stable.  Iron studies low -> Feraheme 510 mg IV x 1 given 6/20.    Home Coumadin regimen:  2.5 mg daily (using 5 mg tablets)  Goal of Therapy:  INR 2-3 Monitor platelets by anticoagulation protocol: Yes   Plan:   Continue Coumadin 2.5 mg daily.  Change PT/INR checks to MWF.  Next 6/25.  Monitor for any bleeding.  Arty Baumgartner, [place] Pager: [phone] 03/21/2017,11:59 AM

## 2017-03-22 DIAGNOSIS — R0602 Shortness of breath: Secondary | ICD-10-CM

## 2017-03-22 LAB — BASIC METABOLIC PANEL
ANION GAP: 11 (ref 5–15)
BUN: 55 mg/dL — AB (ref 6–20)
CALCIUM: 9 mg/dL (ref 8.9–10.3)
CO2: 24 mmol/L (ref 22–32)
Chloride: 102 mmol/L (ref 101–111)
Creatinine, Ser: 2.45 mg/dL — ABNORMAL HIGH (ref 0.44–1.00)
GFR calc Af Amer: 20 mL/min — ABNORMAL LOW (ref >60)
GFR, EST NON AFRICAN AMERICAN: 17 mL/min — AB (ref >60)
GLUCOSE: 128 mg/dL — AB (ref 65–99)
Potassium: 4.1 mmol/L (ref 3.5–5.1)
Sodium: 137 mmol/L (ref 135–145)

## 2017-03-22 MED ORDER — PREDNISONE 10 MG PO TABS: 10.0000 mg | ORAL_TABLET | ORAL | 0 refills | Status: DC

## 2017-03-22 NOTE — Discharge Summary (Addendum)
Physician Discharge Summary  [name] [name] UXL:[id number] DOB: 07/08/1931 DOA: 03/18/2017  PCP: Hoyt Koch, MD  Admit date: 03/18/2017 Discharge date: 03/22/2017  Time spent: > 35 minutes  Recommendations for Outpatient Follow-up:  1. Monitor blood sugars 2. Amiodarone discontinued secondary to amiodarone toxicity suspecting it on CT imaging study of chest. Patient is to complete 2 week prednisone taper 3. Consider referring patient to pulmonologist for further evaluation recommendations. 4. Ensure patient follows up with cardiology given that amiodarone was discontinued. No heart rate abnormalities within 24 hours after discontinuation of amiodarone. 5. Ensure patient gets follow-up for pulmonary nodule 6. Monitor serum creatinine   Discharge Diagnoses:  Active Problems:   Pulmonary HTN (HCC)   Hypertension   Sick sinus syndrome (HCC)   Chronic diastolic CHF (congestive heart failure) (HCC)   CRD (chronic renal disease), stage III   Dyspnea   Diabetes mellitus type 2, noninsulin dependent (HCC)   A-fib (HCC)   Pulmonary nodule   Amiodarone toxicity   Discharge Condition: stable  Diet recommendation: heart healthy carb modified  Filed Weights   03/20/17 0533 03/21/17 0552 03/22/17 0555  Weight: 106.9 kg (235 lb 9.6 oz) 105.7 kg (233 lb 1.6 oz) 106.7 kg (235 lb 3.2 oz)    History of present illness:  [name] a 83 y.o.femalewith medical history significant of Pulmonary htn, sick sinus syndrome s/p pacer, atrial fibrillation, hypertension, CKD 3, cognitive impairment. Patient presents with dyspnea that has worsened over the last three days. Dyspnea is exertional without associated chest pain or diaphoresis. Improved with resting. She has not done anything else to improve symptoms. Daughter reports a 25 lb weight gain over three months. No wheezing or coughing. No fevers. Patient was recently here for runs of V. tach secondary to pacemaker malfunction.  This was adjusted by cardiology at the time. Patient had some dyspnea that time as well after discharge  Pt has CT scan which reported suspected amiodarone toxicity and pulmonary nodule   Hospital Course:  Dyspnea - Prior progress note reviewed - At this point dyspnea could be secondary to worsening lung function secondary to amiodarone toxicity. I have discussed with pulmonary team the CT scan findings and the recommendations are to discontinue amiodarone. Place on prednisone 40 mg for the next few days and taper over the next 2 weeks. Prescription available on discharge - Patient is on Lasix and I think at this point she is euvolemic.  Paroxysmal atrial fibrillation CHA2DS2-VASc Scoreis 5. Paced rhythm. -Hold amiodarone and monitor overnight to see how heart rate does. -Continue metoprolol -Continue warfarin on discharge  Pulmonary nodule - Discussed with pulmonologist who recommends repeating CT scan in 3-6 months.  Sick sinus syndrome S/p pacer - EP specialist evaluated  Chronic diastolic heart failure Last EF of 66 5% with grade 2 diastolic dysfunction seen on EF from 03/15/2017.  -continue home Lasix regimen seems euvolemic  Diabetes mellitus -Continue Sliding-scale insulin -On d/c metformin given serum creatinine elevated above 1.4  Acute kidney injury on CKD stage III Slightly above baseline of about 1.8 to 2.0 -Continue to monitor serum creatinine. Near baseline  Anemia Normocytic. FOBT negative. Baseline of around 10. Stable from three days ago -anemia panel shows low FE was replaced IV according to notes -CBC daily  Cognitive impairment Suspecting dementia. She has been seen as an outpatient by neurology and was prescribed medication, for which she has discontinued using. She does not remember being in hospital a few days prior -Most likely need SNF vs  24 hour supervision  Hypokalemia -replaced and resolved   Procedures:  CT of chest please  reverse few results below  Consultations:  None  Discharge Exam: Vitals:   03/22/17 0804 03/22/17 1154  BP: (!) 110/58 (!) 127/51  Pulse: 60 60  Resp: 18 19  Temp:  98.3 F (36.8 C)    General: Pt in nad, alert and awake Cardiovascular: s1 and s2 present, no rubs Respiratory: no increased wob, no wheezes  Discharge Instructions   Discharge Instructions    Call MD for:  difficulty breathing, headache or visual disturbances    Complete by:  As directed    Call MD for:  temperature >100.4    Complete by:  As directed    Diet - low sodium heart healthy    Complete by:  As directed    Discharge instructions    Complete by:  As directed    Please ensure follow-up with your primary care physician and/or pulmonologist after hospital discharge. Complete your prednisone taper.   Increase activity slowly    Complete by:  As directed      Current Discharge Medication List    START taking these medications   Details  predniSONE (DELTASONE) 10 MG tablet Take 1 tablet (10 mg total) by mouth as directed. Take 40 mg by mouth for the next 2 days. Then take 30 mg by mouth for the next 4 days then take 20 mg by mouth for the next 4 days then take 10 mg by mouth for the next 4 days Qty: 32 tablet, Refills: 0      CONTINUE these medications which have NOT CHANGED   Details  ALPRAZolam (XANAX) 0.25 MG tablet Take 1 tablet (0.25 mg total) by mouth 2 (two) times daily as needed for anxiety. Qty: 20 tablet, Refills: 1    furosemide (LASIX) 80 MG tablet Take 1 tablet (80 mg total) by mouth daily. TAKE 80 MG IN AM AND 40 MG IN PM Qty: 90 tablet, Refills: 1    gabapentin (NEURONTIN) 300 MG capsule Take 600 mg by mouth 2 (two) times daily.     HYDROcodone-acetaminophen (NORCO/VICODIN) 5-325 MG tablet Take 0.5-1 tablets by mouth 2 (two) times daily as needed for moderate pain. Qty: 30 tablet, Refills: 0    metoprolol succinate (TOPROL-XL) 25 MG 24 hr tablet Take 25 mg by mouth daily.     potassium chloride 20 MEQ/15ML (10%) SOLN Take 20 mEq by mouth daily as needed (if have not eaten a banana).    promethazine (PHENERGAN) 25 MG tablet Take 1 tablet (25 mg total) by mouth every 8 (eight) hours as needed for nausea or vomiting. Qty: 20 tablet, Refills: 0    pyridoxine (B-6) 200 MG tablet Take 200 mg by mouth daily.    vitamin B-12 (CYANOCOBALAMIN) 500 MCG tablet Take 500 mcg by mouth daily.     warfarin (COUMADIN) 5 MG tablet Take 2.5 mg by mouth daily.      STOP taking these medications     amiodarone (PACERONE) 200 MG tablet      metFORMIN (GLUCOPHAGE) 500 MG tablet        Allergies  Allergen Reactions  . Flecainide Shortness Of Breath and Other (See Comments)    toxicity  . Morphine And Related Other (See Comments)    Family requests no morphine due to past reaction. Pt stated it made her sick.  . Cephalexin Nausea And Vomiting   Follow-up Information    [facility]  Why:  They will continue to do your home health care at your home Contact information: [address] [place] [address] [phone]            The results of significant diagnostics from this hospitalization (including imaging, microbiology, ancillary and laboratory) are listed below for reference.    Significant Diagnostic Studies: Dg Chest 2 View  Result Date: 03/18/2017 CLINICAL DATA:  Acute onset of shortness of breath. Initial encounter. EXAM: CHEST  2 VIEW COMPARISON:  Chest radiograph performed 03/14/2017 FINDINGS: The lungs are well-aerated. Mild retrocardiac airspace opacity raises concern for mild pneumonia, particularly on the lateral view. There is no evidence of focal opacification, pleural effusion or pneumothorax. The heart is mildly enlarged. A pacemaker is noted at the left chest wall, with leads ending at the right atrium and right ventricle. No acute osseous abnormalities are seen. Mild chronic right-sided rib deformities are noted. A  moderate to large hiatal hernia is noted. IMPRESSION: 1. Mild retrocardiac airspace opacity raises concern for mild pneumonia, particularly on the lateral view. 2. Moderate to large hiatal hernia noted. Electronically Signed   By: Garald Balding M.D.   On: 03/18/2017 16:24   Ct Head Wo Contrast  Result Date: 03/14/2017 CLINICAL DATA:  Weakness EXAM: CT HEAD WITHOUT CONTRAST TECHNIQUE: Contiguous axial images were obtained from the base of the skull through the vertex without intravenous contrast. COMPARISON:  CT head 10/14/2016 FINDINGS: Brain: Moderate chronic microvascular ischemic change. Mild atrophy. Negative for acute infarct, hemorrhage, or mass lesion. Vascular: Negative for hyperdense vessel. Skull: Negative Sinuses/Orbits: Negative Other: None IMPRESSION: Atrophy and chronic microvascular ischemia.  No acute abnormality. Electronically Signed   By: Franchot Gallo M.D.   On: 03/14/2017 14:35   Ct Chest High Resolution  Result Date: 03/21/2017 CLINICAL DATA:  Inpatient. Hypoxia. Dyspnea. Long-term amiodarone use. EXAM: CT CHEST WITHOUT CONTRAST TECHNIQUE: Multidetector CT imaging of the chest was performed following the standard protocol without intravenous contrast. High resolution imaging of the lungs, as well as inspiratory and expiratory imaging, was performed. COMPARISON:  04/12/2016 chest CT. Chest radiograph from one day prior. FINDINGS: Cardiovascular: Mild cardiomegaly. No significant pericardial fluid/thickening. Two lead left subclavian pacemaker with lead tips in the right atrium and right ventricular apex. Left anterior descending, left circumflex and right coronary atherosclerosis. Atherosclerotic nonaneurysmal thoracic aorta. Top-normal caliber main pulmonary artery (3.4 cm diameter), stable. Mediastinum/Nodes: Stable right thyroid lobe nodules, largest 1.5 cm in the anterior right thyroid lobe. Unremarkable esophagus. No axillary adenopathy. Stable mildly enlarged 1.3 cm subcarinal  node (series 5/ image 72). No additional pathologically enlarged mediastinal or gross hilar nodes on this noncontrast scan. Surgical clips are noted in the left axilla. Lungs/Pleura: No pneumothorax. No pleural effusion. Mild centrilobular and paraseptal emphysema with mild diffuse bronchial wall thickening. No acute consolidative airspace disease or lung masses. Irregular 6 mm solid left upper lobe pulmonary nodule (series 6/ image 53), new since 04/12/2016 chest CT. No additional significant pulmonary nodules. No significant lobular air trapping on the expiration sequence. Patchy confluent subpleural reticulation and ground-glass attenuation in both lungs with associated minimal traction bronchiolectasis, with a dependent basilar predominance to these findings. These findings are mildly progressed since 04/12/2016 chest CT and were absent on the 10/14/2011 chest CT. No frank honeycombing. Upper abdomen: Granulomatous left lower lobe calcification. Moderate to large hiatal hernia. Musculoskeletal: No aggressive appearing focal osseous lesions. Marked thoracic spondylosis. IMPRESSION: 1. 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 Mild interval progression since 04/12/2016 chest CT. These findings were absent on 10/14/2011 chest CT. Although not specific, these findings are compatible with amiodarone pulmonary toxicity in the correct clinical setting. 2. Irregular 6 mm solid left upper lobe pulmonary nodule, new since 04/12/2016 chest CT, raising suspicion for primary bronchogenic malignancy in this high risk patient with smoking related changes in the lungs. Consensus guidelines recommend a non-contrast chest CT at 6-12 months for a nodule of this size, however I would consider a 3-6 month follow-up  noncontrast chest CT given the suspicious morphology. This nodule is too small for PET characterization at this time. 3. Moderate to large hiatal hernia. 4. Mild cardiomegaly. 5. Three-vessel coronary atherosclerosis. Aortic Atherosclerosis (ICD10-I70.0) and Emphysema (ICD10-J43.9). Electronically Signed   By: Ilona Sorrel M.D.   On: 03/21/2017 11:02   Dg Chest Port 1 View  Result Date: 03/19/2017 CLINICAL DATA:  Shortness of Breath EXAM: PORTABLE CHEST 1 VIEW COMPARISON:  03/18/2017 FINDINGS: Cardiac shadow remains enlarged. A pacing device is again seen. Large hiatal hernia is again seen. The lungs are well aerated bilaterally. Old rib fractures are noted on the right. IMPRESSION: No acute abnormality noted. Electronically Signed   By: Inez Catalina M.D.   On: 03/19/2017 07:30   Dg Chest Port 1 View  Result Date: 03/14/2017 CLINICAL DATA:  Dyspnea EXAM: PORTABLE CHEST 1 VIEW COMPARISON:  12/28/2016 chest radiograph. FINDINGS: Stable configuration of 2 lead left subclavian pacemaker. Pacer pad overlies the left chest. Stable cardiomediastinal silhouette with mild cardiomegaly, moderate to large hiatal hernia and aortic atherosclerosis. No pneumothorax. Trace bilateral pleural effusions. No overt pulmonary edema. Stable compressive atelectasis in the medial lower lobes. No acute consolidative airspace disease. IMPRESSION: 1. Stable cardiomegaly without overt pulmonary edema. 2. Stable moderate to large hiatal hernia with associated compressive atelectasis in the medial lower lobes. 3. No acute pulmonary disease. Electronically Signed   By: Ilona Sorrel M.D.   On: 03/14/2017 10:37    Microbiology: Recent Results (from the past 240 hour(s))  Culture, blood (routine x 2) Call MD if unable to obtain prior to antibiotics being given     Status: None (Preliminary result)   Collection Time: 03/18/17  8:47 PM  Result Value Ref Range Status   Specimen Description BLOOD RIGHT ANTECUBITAL  Final   Special  Requests   Final    BOTTLES DRAWN AEROBIC ONLY Blood Culture adequate volume   Culture NO GROWTH 4 DAYS  Final   Report Status PENDING  Incomplete  Culture, blood (routine x 2) Call MD if unable to obtain prior to antibiotics being given     Status: None (Preliminary result)   Collection Time: 03/18/17  8:56 PM  Result Value Ref Range Status   Specimen Description BLOOD RIGHT HAND  Final   Special Requests IN PEDIATRIC BOTTLE Blood Culture adequate volume  Final   Culture NO GROWTH 4 DAYS  Final   Report Status PENDING  Incomplete     Labs: Basic Metabolic Panel:  Recent Labs Lab 03/18/17 1508 03/19/17 0252 03/20/17 0337 03/21/17 0408 03/22/17 0018  NA 137 138 138 139 137  K 4.1 3.7 3.4* 3.9 4.1  CL 101 103 101 103 102  CO2 27 26 27 26 24   GLUCOSE 120* 121* 125* 122* 128*  BUN 40* 38* 38* 43* 55*  CREATININE 2.22* 2.03* 2.10* 2.08* 2.45*  CALCIUM 9.0 8.6* 8.9 9.1 9.0  MG  --   --  2.2  --   --    Liver Function Tests:  Recent Labs Lab 03/18/17 1508  AST 19  ALT 13*  ALKPHOS 115  BILITOT 0.7  PROT 7.4  ALBUMIN 3.4*   No results for input(s): LIPASE, AMYLASE in the last 168 hours. No results for input(s): AMMONIA in the last 168 hours. CBC:  Recent Labs Lab 03/18/17 1508 03/19/17 0252 03/20/17 0337 03/21/17 0408  WBC 6.8 5.3 3.7* 4.8  NEUTROABS 5.3  --   --   --   HGB 8.8* 7.9* 8.9* 9.2*  HCT 29.8* 26.4* 30.0* 30.3*  MCV 80.1 79.3 80.2 78.5  PLT 436* 364 398 392   Cardiac Enzymes: No results for input(s): CKTOTAL, CKMB, CKMBINDEX, TROPONINI in the last 168 hours. BNP: BNP (last 3 results)  Recent Labs  03/14/17 1005 03/18/17 1508 03/21/17 0408  BNP 284.4* 410.0* 431.2*    ProBNP (last 3 results) No results for input(s): PROBNP in the last 8760 hours.  CBG:  Recent Labs Lab 03/18/17 2011 03/19/17 0738 03/19/17 1129 03/19/17 1608 03/19/17 2057  GLUCAP 104* 113* 142* 131* 193*    Signed:  Velvet Bathe MD.  [facility] 03/22/2017, 3:22 PM

## 2017-03-22 NOTE — Progress Notes (Signed)
Discharge instructions reviewed with patient and caregivers, questions answered, verbalized understanding.  Patient transported to front of hospital to be taken home by family.

## 2017-03-22 NOTE — Progress Notes (Signed)
ANTICOAGULATION CONSULT NOTE - Follow Up Consult  Pharmacy Consult for Warfarin Indication: atrial fibrillation  Allergies  Allergen Reactions  . Flecainide Shortness Of Breath and Other (See Comments)    toxicity  . Morphine And Related Other (See Comments)    Family requests no morphine due to past reaction. Pt stated it made her sick.  . Cephalexin Nausea And Vomiting    Patient Measurements: Height: 6' (182.9 cm) Weight: 235 lb 3.2 oz (106.7 kg) (Scale C) IBW/kg (Calculated) : 73.1  Vital Signs: Temp: 97.4 F (36.3 C) (06/23 0555) Temp Source: Oral (06/23 0555) BP: 110/58 (06/23 0804) Pulse Rate: 60 (06/23 0804)  Labs:  Recent Labs  03/20/17 0337 03/21/17 0408 03/22/17 0018  HGB 8.9* 9.2*  --   HCT 30.0* 30.3*  --   PLT 398 392  --   LABPROT 30.5* 29.7*  --   INR 2.84 2.75  --   CREATININE 2.10* 2.08* 2.45*    Estimated Creatinine Clearance: 23.3 mL/min (A) (by C-G formula based on SCr of 2.45 mg/dL (H)).  Assessment:  83 yr old female admitted 6/19 w/ dyspnea continues on Coumadin for atrial fibrillation.  INR has been within goal on PTA regmin, no INR today. CBC stable.  Iron studies low -> Feraheme 510 mg IV x 1 given 6/20. Noted that amiodarone was held last night d/t lung toxicity and prednisone was initiated, both of which can impact INR. Renal function also worsened overnight.  Home Coumadin regimen:  2.5 mg daily (using 5 mg tablets)  Goal of Therapy:  INR 2-3 Monitor platelets by anticoagulation protocol: Yes   Plan:  Continue warfarin 2.5 mg daily. Check INR tomorrow d/t medication changes, likely discharging soon per notes Monitor for any bleeding.  Demetrius Charity, PharmD Acute Care Pharmacy Resident  Pager: [phone] 03/22/2017

## 2017-03-22 NOTE — Progress Notes (Signed)
Notified [facility] of [place] orders for PT OT HHA RN SW and DC today.

## 2017-03-23 LAB — CULTURE, BLOOD (ROUTINE X 2)
Culture: NO GROWTH
Culture: NO GROWTH
SPECIAL REQUESTS: ADEQUATE
Special Requests: ADEQUATE

## 2017-03-24 ENCOUNTER — Telehealth: Payer: Self-pay | Admitting: *Deleted

## 2017-03-24 NOTE — Telephone Encounter (Signed)
Error for appt in TCM call below. Pt appt is on 03/26/17, not 03/24/17 (today)...[name] [name]

## 2017-03-24 NOTE — Telephone Encounter (Signed)
Transition Care Management Follow-up Telephone Call   Date discharged? 03/22/17   How have you been since you were released from the hospital? Pt states she is doing pretty good   Do you understand why you were in the hospital? YES   Do you understand the discharge instructions? YES   Where were you discharged to? Home   Items Reviewed:  Medications reviewed: YES   Allergies reviewed: YES  Dietary changes reviewed: YES, carb modified and heart healthy  Referrals reviewed: YES, she has made f/u appt w/cardiology. ? About pulmonology referral inform once she have appt w/PCP they will put referral in.    Functional Questionnaire:   Activities of Daily Living (ADLs):   She states she are independent in the following: ambulation, bathing and hygiene, feeding, continence, grooming, toileting and dressing States she require assistance with the following: ambulation   Any transportation issues/concerns?: NO   Any patient concerns? NO   Confirmed importance and date/time of follow-up visits scheduled YES, appt 03/24/17  Provider Appointment booked with NP Wilfred Lacy since PCP is out of office  Confirmed with patient if condition begins to worsen call PCP or go to the ER.  Patient was given the office number and encouraged to call back with question or concerns.  : YES

## 2017-03-26 ENCOUNTER — Encounter: Payer: Self-pay | Admitting: Nurse Practitioner

## 2017-03-26 ENCOUNTER — Ambulatory Visit (INDEPENDENT_AMBULATORY_CARE_PROVIDER_SITE_OTHER): Payer: Medicare HMO | Admitting: Nurse Practitioner

## 2017-03-26 ENCOUNTER — Other Ambulatory Visit (INDEPENDENT_AMBULATORY_CARE_PROVIDER_SITE_OTHER): Payer: Medicare HMO

## 2017-03-26 ENCOUNTER — Ambulatory Visit (INDEPENDENT_AMBULATORY_CARE_PROVIDER_SITE_OTHER): Payer: Medicare HMO | Admitting: Pharmacist

## 2017-03-26 VITALS — BP 110/60 | HR 59 | Temp 97.6°F | Ht 72.0 in | Wt 239.0 lb

## 2017-03-26 DIAGNOSIS — T462X4A Poisoning by other antidysrhythmic drugs, undetermined, initial encounter: Secondary | ICD-10-CM

## 2017-03-26 DIAGNOSIS — I5032 Chronic diastolic (congestive) heart failure: Secondary | ICD-10-CM | POA: Diagnosis not present

## 2017-03-26 DIAGNOSIS — E119 Type 2 diabetes mellitus without complications: Secondary | ICD-10-CM | POA: Diagnosis not present

## 2017-03-26 DIAGNOSIS — R69 Illness, unspecified: Secondary | ICD-10-CM | POA: Diagnosis not present

## 2017-03-26 DIAGNOSIS — I482 Chronic atrial fibrillation, unspecified: Secondary | ICD-10-CM

## 2017-03-26 DIAGNOSIS — I10 Essential (primary) hypertension: Secondary | ICD-10-CM

## 2017-03-26 DIAGNOSIS — N184 Chronic kidney disease, stage 4 (severe): Secondary | ICD-10-CM

## 2017-03-26 DIAGNOSIS — Z86711 Personal history of pulmonary embolism: Secondary | ICD-10-CM | POA: Diagnosis not present

## 2017-03-26 LAB — BASIC METABOLIC PANEL
BUN: 46 mg/dL — AB (ref 6–23)
CHLORIDE: 101 meq/L (ref 96–112)
CO2: 31 meq/L (ref 19–32)
Calcium: 9.4 mg/dL (ref 8.4–10.5)
Creatinine, Ser: 2.09 mg/dL — ABNORMAL HIGH (ref 0.40–1.20)
GFR: 23.96 mL/min — ABNORMAL LOW (ref >60.00)
GLUCOSE: 176 mg/dL — AB (ref 70–99)
POTASSIUM: 3.7 meq/L (ref 3.5–5.1)
Sodium: 139 mEq/L (ref 135–145)

## 2017-03-26 LAB — POCT INR: INR: 4.1

## 2017-03-26 MED ORDER — BLOOD GLUCOSE MONITOR KIT: PACK | 2 refills | Status: AC

## 2017-03-26 NOTE — Patient Instructions (Signed)
You will be contacted to schedule appt with nephrology.  Start glucose check twice a day and record. Call office if glucose >200 for more than 2days.  Continue to hold metformin.   Heart Failure Heart failure means your heart has trouble pumping blood. This makes it hard for your body to work well. Heart failure is usually a long-term (chronic) condition. You must take good care of yourself and follow your doctor's treatment plan. Follow these instructions at home:  Take your heart medicine as told by your doctor. ? Do not stop taking medicine unless your doctor tells you to. ? Do not skip any dose of medicine. ? Refill your medicines before they run out. ? Take other medicines only as told by your doctor or pharmacist.  Stay active if told by your doctor. The elderly and people with severe heart failure should talk with a doctor about physical activity.  Eat heart-healthy foods. Choose foods that are without trans fat and are low in saturated fat, cholesterol, and salt (sodium). This includes fresh or frozen fruits and vegetables, fish, lean meats, fat-free or low-fat dairy foods, whole grains, and high-fiber foods. Lentils and dried peas and beans (legumes) are also good choices.  Limit salt if told by your doctor.  Cook in a healthy way. Roast, grill, broil, bake, poach, steam, or stir-fry foods.  Limit fluids as told by your doctor.  Weigh yourself every morning. Do this after you pee (urinate) and before you eat breakfast. Write down your weight to give to your doctor.  Take your blood pressure and write it down if your doctor tells you to.  Ask your doctor how to check your pulse. Check your pulse as told.  Lose weight if told by your doctor.  Stop smoking or chewing tobacco. Do not use gum or patches that help you quit without your doctor's approval.  Schedule and go to doctor visits as told.  Nonpregnant women should have no more than 1 drink a day. Men should have no  more than 2 drinks a day. Talk to your doctor about drinking alcohol.  Stop illegal drug use.  Stay current with shots (immunizations).  Manage your health conditions as told by your doctor.  Learn to manage your stress.  Rest when you are tired.  If it is really hot outside: ? Avoid intense activities. ? Use air conditioning or fans, or get in a cooler place. ? Avoid caffeine and alcohol. ? Wear loose-fitting, lightweight, and light-colored clothing.  If it is really cold outside: ? Avoid intense activities. ? Layer your clothing. ? Wear mittens or gloves, a hat, and a scarf when going outside. ? Avoid alcohol.  Learn about heart failure and get support as needed.  Get help to maintain or improve your quality of life and your ability to care for yourself as needed. Contact a doctor if:  You gain weight quickly.  You are more short of breath than usual.  You cannot do your normal activities.  You tire easily.  You cough more than normal, especially with activity.  You have any or more puffiness (swelling) in areas such as your hands, feet, ankles, or belly (abdomen).  You cannot sleep because it is hard to breathe.  You feel like your heart is beating fast (palpitations).  You get dizzy or light-headed when you stand up. Get help right away if:  You have trouble breathing.  There is a change in mental status, such as becoming less alert or  not being able to focus.  You have chest pain or discomfort.  You faint. This information is not intended to replace advice given to you by your health care provider. Make sure you discuss any questions you have with your health care provider. Document Released: 06/25/2008 Document Revised: 02/22/2016 Document Reviewed: 11/02/2012 Elsevier Interactive Patient Education  2017 [employer].

## 2017-03-26 NOTE — Assessment & Plan Note (Signed)
Due to CKD IV, stopped metfomin. hgb A1c 6.3 (09/2016) Monitor glucose at home BID.

## 2017-03-26 NOTE — Assessment & Plan Note (Signed)
amiodarone discontinued. Current use of oral prednisone Repeat CT chest in 3-41months

## 2017-03-26 NOTE — Progress Notes (Signed)
Subjective:  Patient ID: [name] [name], female    DOB: Aug 10, 1931  Age: 83 y.o. MRN: [id number]  CC: Hospitalization Follow-up (hospital follow up: metformin/ med consult-SOB)   HPI Admission: 03/18/2017 Discharge: 03/22/2017.  [name] [name] was hospitalized due to dyspnea secondary to amiodarone toxicity.Amiodarone was discontinued and oral prednisone prescribed. Hospitalization was complicated V. Tach event secondary to pacemaker malfunction, and worsenign renal function. Metformin discontinued.  CT chest report and labs reviewed. Needs repeat CT chest in 3-79month  [name] SFrybergeris accompanied by daughter today. She has home RN who helps with medication and ADLs. Her daughter is concerned about the discontinuation of metformin during hospitalization. Does not check glucose at home. Never evaluated by nephrology. Daughter also stopped administering hydrocodone due to increased somnolence. Only tylenol 5083mis used at this time to manage pain.  appt with cardiology 04/04/2017.   Medication list reviewed and reconciled with patient and daughter. Outpatient Medications Prior to Visit  Medication Sig Dispense Refill  . ALPRAZolam (XANAX) 0.25 MG tablet Take 1 tablet (0.25 mg total) by mouth 2 (two) times daily as needed for anxiety. 20 tablet 1  . gabapentin (NEURONTIN) 300 MG capsule Take 600 mg by mouth 2 (two) times daily.     . metoprolol succinate (TOPROL-XL) 25 MG 24 hr tablet Take 25 mg by mouth daily.    . potassium chloride 20 MEQ/15ML (10%) SOLN Take 20 mEq by mouth daily as needed (if have not eaten a banana).    . predniSONE (DELTASONE) 10 MG tablet Take 1 tablet (10 mg total) by mouth as directed. Take 40 mg by mouth for the next 2 days. Then take 30 mg by mouth for the next 4 days then take 20 mg by mouth for the next 4 days then take 10 mg by mouth for the next 4 days 32 tablet 0  . promethazine (PHENERGAN) 25 MG tablet Take 1 tablet (25 mg total) by mouth every 8 (eight)  hours as needed for nausea or vomiting. 20 tablet 0  . pyridoxine (B-6) 200 MG tablet Take 200 mg by mouth daily.    . vitamin B-12 (CYANOCOBALAMIN) 500 MCG tablet Take 500 mcg by mouth daily.     . Marland Kitchenarfarin (COUMADIN) 5 MG tablet Take 2.5 mg by mouth daily.    . furosemide (LASIX) 80 MG tablet Take 1 tablet (80 mg total) by mouth daily. TAKE 80 MG IN AM AND 40 MG IN PM (Patient taking differently: Take 80 mg by mouth 2 (two) times daily. ) 90 tablet 1  . HYDROcodone-acetaminophen (NORCO/VICODIN) 5-325 MG tablet Take 0.5-1 tablets by mouth 2 (two) times daily as needed for moderate pain. (Patient not taking: Reported on 03/26/2017) 30 tablet 0   No facility-administered medications prior to visit.     ROS See HPI  Objective:  BP 110/60   Pulse (!) 59   Temp 97.6 F (36.4 C)   Ht 6' (1.829 m)   Wt 239 lb (108.4 kg)   SpO2 95%   BMI 32.41 kg/m   BP Readings from Last 3 Encounters:  03/26/17 110/60  03/22/17 (!) 127/51  03/15/17 (!) 101/46    Wt Readings from Last 3 Encounters:  03/26/17 239 lb (108.4 kg)  03/22/17 235 lb 3.2 oz (106.7 kg)  03/15/17 237 lb 9.6 oz (107.8 kg)    Physical Exam  Constitutional: She is oriented to person, place, and time.  Neck: Normal range of motion. Neck supple. JVD present.  Cardiovascular: Normal rate and  regular rhythm.   Murmur heard. Pulmonary/Chest: Effort normal and breath sounds normal.  Musculoskeletal: She exhibits edema. She exhibits no tenderness.  Neurological: She is alert and oriented to person, place, and time.  Skin: Skin is warm and dry.  Vitals reviewed.   Lab Results  Component Value Date   WBC 4.8 03/21/2017   HGB 9.2 (L) 03/21/2017   HCT 30.3 (L) 03/21/2017   PLT 392 03/21/2017   GLUCOSE 176 (H) 03/26/2017   ALT 13 (L) 03/18/2017   AST 19 03/18/2017   NA 139 03/26/2017   K 3.7 03/26/2017   CL 101 03/26/2017   CREATININE 2.09 (H) 03/26/2017   BUN 46 (H) 03/26/2017   CO2 31 03/26/2017   TSH 1.650 01/10/2017    INR 4.1 03/26/2017   HGBA1C 6.3 (H) 10/14/2016    Dg Chest 2 View  Result Date: 03/18/2017 CLINICAL DATA:  Acute onset of shortness of breath. Initial encounter. EXAM: CHEST  2 VIEW COMPARISON:  Chest radiograph performed 03/14/2017 FINDINGS: The lungs are well-aerated. Mild retrocardiac airspace opacity raises concern for mild pneumonia, particularly on the lateral view. There is no evidence of focal opacification, pleural effusion or pneumothorax. The heart is mildly enlarged. A pacemaker is noted at the left chest wall, with leads ending at the right atrium and right ventricle. No acute osseous abnormalities are seen. Mild chronic right-sided rib deformities are noted. A moderate to large hiatal hernia is noted. IMPRESSION: 1. Mild retrocardiac airspace opacity raises concern for mild pneumonia, particularly on the lateral view. 2. Moderate to large hiatal hernia noted. Electronically Signed   By: Garald Balding M.D.   On: 03/18/2017 16:24   Dg Chest Port 1 View  Result Date: 03/19/2017 CLINICAL DATA:  Shortness of Breath EXAM: PORTABLE CHEST 1 VIEW COMPARISON:  03/18/2017 FINDINGS: Cardiac shadow remains enlarged. A pacing device is again seen. Large hiatal hernia is again seen. The lungs are well aerated bilaterally. Old rib fractures are noted on the right. IMPRESSION: No acute abnormality noted. Electronically Signed   By: Inez Catalina M.D.   On: 03/19/2017 07:30    Assessment & Plan:   [name] [name] was seen today for hospitalization follow-up.  Diagnoses and all orders for this visit:  Chronic diastolic CHF (congestive heart failure) ([place]) -     Basic metabolic panel; Future  Essential hypertension -     Basic metabolic panel; Future  Diabetes mellitus type 2, noninsulin dependent ([place]) -     blood glucose meter kit and supplies KIT; Dispense based on patient and insurance preference. Use before breakfast and at bedtime. Diagnosis code: E11.9 -     Ambulatory referral to  Nephrology  CKD (chronic kidney disease), stage IV ([place]) -     Basic metabolic panel; Future -     Ambulatory referral to Nephrology  Amiodarone toxicity, undetermined intent, initial encounter -     Basic metabolic panel; Future   I am having [name]. [name] start on blood glucose meter kit and supplies. I am also having her maintain her vitamin B-12, pyridoxine, HYDROcodone-acetaminophen, promethazine, gabapentin, ALPRAZolam, warfarin, potassium chloride, metoprolol succinate, predniSONE, Acetaminophen (TYLENOL 8 HOUR PO), and furosemide.  Meds ordered this encounter  Medications  . Acetaminophen (TYLENOL 8 HOUR PO)    Sig: Take by mouth.  . blood glucose meter kit and supplies KIT    Sig: Dispense based on patient and insurance preference. Use before breakfast and at bedtime. Diagnosis code: E11.9    Dispense:  1 each  Refill:  2    Order Specific Question:   Supervising Provider    Answer:   Cassandria Anger [[id number]]    Order Specific Question:   Number of strips    Answer:   100    Order Specific Question:   Number of lancets    Answer:   100  . furosemide (LASIX) 80 MG tablet    Sig: Take 1 tablet (80 mg total) by mouth daily. TAKE 80 MG IN AM AND 40 MG IN PM    Dispense:  90 tablet    Refill:  1    Order Specific Question:   Supervising Provider    Answer:   Cassandria Anger [[id number]]    Follow-up: Return in about 3 months (around 06/26/2017) for DM and CKD with Dr. Sharlet Salina.  Wilfred Lacy, NP

## 2017-03-26 NOTE — Assessment & Plan Note (Signed)
Refer to nephrology 

## 2017-03-27 ENCOUNTER — Telehealth: Payer: Self-pay | Admitting: Internal Medicine

## 2017-03-27 ENCOUNTER — Other Ambulatory Visit (HOSPITAL_COMMUNITY): Payer: Medicare HMO

## 2017-03-27 DIAGNOSIS — I5032 Chronic diastolic (congestive) heart failure: Secondary | ICD-10-CM | POA: Diagnosis not present

## 2017-03-27 DIAGNOSIS — I480 Paroxysmal atrial fibrillation: Secondary | ICD-10-CM | POA: Diagnosis not present

## 2017-03-27 DIAGNOSIS — E1140 Type 2 diabetes mellitus with diabetic neuropathy, unspecified: Secondary | ICD-10-CM | POA: Diagnosis not present

## 2017-03-27 DIAGNOSIS — E1122 Type 2 diabetes mellitus with diabetic chronic kidney disease: Secondary | ICD-10-CM | POA: Diagnosis not present

## 2017-03-27 DIAGNOSIS — I272 Pulmonary hypertension, unspecified: Secondary | ICD-10-CM | POA: Diagnosis not present

## 2017-03-27 DIAGNOSIS — I2510 Atherosclerotic heart disease of native coronary artery without angina pectoris: Secondary | ICD-10-CM | POA: Diagnosis not present

## 2017-03-27 DIAGNOSIS — I130 Hypertensive heart and chronic kidney disease with heart failure and stage 1 through stage 4 chronic kidney disease, or unspecified chronic kidney disease: Secondary | ICD-10-CM | POA: Diagnosis not present

## 2017-03-27 DIAGNOSIS — I495 Sick sinus syndrome: Secondary | ICD-10-CM | POA: Diagnosis not present

## 2017-03-27 DIAGNOSIS — N184 Chronic kidney disease, stage 4 (severe): Secondary | ICD-10-CM | POA: Diagnosis not present

## 2017-03-27 DIAGNOSIS — D631 Anemia in chronic kidney disease: Secondary | ICD-10-CM | POA: Diagnosis not present

## 2017-03-27 NOTE — Telephone Encounter (Signed)
New message    Pt c/o Shortness Of Breath: STAT if SOB developed within the last 24 hours or pt is noticeably SOB on the phone  1. Are you currently SOB (can you hear that pt is SOB on the phone)? Pt daughter on the phone, states that when she spoke w/ pt she was very winded after walking to the kitchen.  2. How long have you been experiencing SOB? Since yesterday  3. Are you SOB when sitting or when up moving around? Up moving around  4. Are you currently experiencing any other symptoms? dizzy

## 2017-03-27 NOTE — Telephone Encounter (Signed)
°  New Prob  Feeling pt is not taking her medications as prescribed. Requesting to speak to nurse regarding what she has noted.

## 2017-03-27 NOTE — Telephone Encounter (Signed)
Spoke with daughter [name] [name] who is concerned RE: pt's SOB that she is having with activity.  She had SOB today walking from bedroom to the kitchen.   She has been having this SOB and was the reason for her recent hospitalization.  Her Amiodarone was d/ced 5 days ago d/t toxicity and she was started on prednisone.  Recent BP was 110/60 and HR 59 at PCP yesterday.  She has no edema and no weigh changes.  Daughter is concerned about recent changes made to her pacer (while in hospital) and whether it is causing the SOB.  She is wanting to know how to proceed.  Pt has f/u appt scheduled with Renee 7/6.  Aware I will forward this information to Renee and Dr Rayann Heman for review and recommendations/orders if necessary.

## 2017-03-27 NOTE — Telephone Encounter (Signed)
[facility] nurse called Coumadin clinic as well - pt had stated at INR check yesterday she was on day 6 of her prednisone taper. Nurse reports that pt never started taking her prednisone, it looks like she had been taking extra metoprolol. Nurse is planning to start prednisone taper today with 40mg  x2 days, 30mg  x4 days, 20mg  x4 days, and 10mg  x4 days. INR was high yesterday at 4.1 thought to be due to prednisone use, however must have just been from recent amiodarone discontinuation. Will move up next INR check to Monday 7/2, gave verbal order to [facility] and she will call INR to Coumadin clinic.  Do not need to address message that pt is not taking meds as prescribed - this was in regards to her prednisone taper.  Pt's daughter is still expecting a call regarding pt symptoms. It is very likely that SOB has continued since pt never started her prednisone taper as advised.

## 2017-03-28 ENCOUNTER — Telehealth: Payer: Self-pay | Admitting: Family Medicine

## 2017-03-28 DIAGNOSIS — I272 Pulmonary hypertension, unspecified: Secondary | ICD-10-CM | POA: Diagnosis not present

## 2017-03-28 DIAGNOSIS — N184 Chronic kidney disease, stage 4 (severe): Secondary | ICD-10-CM | POA: Diagnosis not present

## 2017-03-28 DIAGNOSIS — I2510 Atherosclerotic heart disease of native coronary artery without angina pectoris: Secondary | ICD-10-CM | POA: Diagnosis not present

## 2017-03-28 DIAGNOSIS — E1122 Type 2 diabetes mellitus with diabetic chronic kidney disease: Secondary | ICD-10-CM | POA: Diagnosis not present

## 2017-03-28 DIAGNOSIS — I5032 Chronic diastolic (congestive) heart failure: Secondary | ICD-10-CM | POA: Diagnosis not present

## 2017-03-28 DIAGNOSIS — I480 Paroxysmal atrial fibrillation: Secondary | ICD-10-CM | POA: Diagnosis not present

## 2017-03-28 DIAGNOSIS — D631 Anemia in chronic kidney disease: Secondary | ICD-10-CM | POA: Diagnosis not present

## 2017-03-28 DIAGNOSIS — R195 Other fecal abnormalities: Secondary | ICD-10-CM

## 2017-03-28 DIAGNOSIS — I130 Hypertensive heart and chronic kidney disease with heart failure and stage 1 through stage 4 chronic kidney disease, or unspecified chronic kidney disease: Secondary | ICD-10-CM | POA: Diagnosis not present

## 2017-03-28 DIAGNOSIS — I495 Sick sinus syndrome: Secondary | ICD-10-CM | POA: Diagnosis not present

## 2017-03-28 DIAGNOSIS — E1140 Type 2 diabetes mellitus with diabetic neuropathy, unspecified: Secondary | ICD-10-CM | POA: Diagnosis not present

## 2017-03-28 NOTE — Telephone Encounter (Signed)
Left message for patient's daughter that hopefully with starting the Prednisone correctly this will help with her SOB.  I let her know that I am not in the office and if she continues to have problems to call back.  Otherwise keep appointment as scheduled.

## 2017-03-28 NOTE — Telephone Encounter (Signed)
Received an after hours call from [facility] PT that was working with patient performing physical therapy this evening. During her questioning the patient, the patient expressed having a dark stool that day, but she states she thinks it was from all the spinach that she ate. Patient has never had a dark stool before. She is on Coumadin therapy. She denies bright red blood per rectum. She is asymptomatic, and tolerated her physical therapy well without incident today. Physical therapist states that her vital signs are all within normal range. In review of the electronic medical record it would appear that the patient had a elevated INR of 4.1  2 days ago. She was told to her her Coumadin dose, which she states she has been doing for 2 -3 days. - Advised physical therapist since her vitals are normal, she tolerated physical therapy and she is completely asymptomatic she does not have to go to the emergency room right now. She is to continue holding her Coumadin as instructed, and contact her doctor on Monday morning. - If she experiences any dizziness, chest pain, shortness of breath or has a reoccurrence of any discolored stool she should go to the emergency room right away.

## 2017-03-31 ENCOUNTER — Ambulatory Visit (INDEPENDENT_AMBULATORY_CARE_PROVIDER_SITE_OTHER): Payer: Self-pay | Admitting: Internal Medicine

## 2017-03-31 ENCOUNTER — Telehealth: Payer: Self-pay | Admitting: Internal Medicine

## 2017-03-31 DIAGNOSIS — E1140 Type 2 diabetes mellitus with diabetic neuropathy, unspecified: Secondary | ICD-10-CM | POA: Diagnosis not present

## 2017-03-31 DIAGNOSIS — I2510 Atherosclerotic heart disease of native coronary artery without angina pectoris: Secondary | ICD-10-CM | POA: Diagnosis not present

## 2017-03-31 DIAGNOSIS — I495 Sick sinus syndrome: Secondary | ICD-10-CM | POA: Diagnosis not present

## 2017-03-31 DIAGNOSIS — D631 Anemia in chronic kidney disease: Secondary | ICD-10-CM | POA: Diagnosis not present

## 2017-03-31 DIAGNOSIS — I480 Paroxysmal atrial fibrillation: Secondary | ICD-10-CM | POA: Diagnosis not present

## 2017-03-31 DIAGNOSIS — I130 Hypertensive heart and chronic kidney disease with heart failure and stage 1 through stage 4 chronic kidney disease, or unspecified chronic kidney disease: Secondary | ICD-10-CM | POA: Diagnosis not present

## 2017-03-31 DIAGNOSIS — N184 Chronic kidney disease, stage 4 (severe): Secondary | ICD-10-CM | POA: Diagnosis not present

## 2017-03-31 DIAGNOSIS — R195 Other fecal abnormalities: Secondary | ICD-10-CM

## 2017-03-31 DIAGNOSIS — E1122 Type 2 diabetes mellitus with diabetic chronic kidney disease: Secondary | ICD-10-CM | POA: Diagnosis not present

## 2017-03-31 DIAGNOSIS — I272 Pulmonary hypertension, unspecified: Secondary | ICD-10-CM | POA: Diagnosis not present

## 2017-03-31 DIAGNOSIS — Z86711 Personal history of pulmonary embolism: Secondary | ICD-10-CM

## 2017-03-31 DIAGNOSIS — I5032 Chronic diastolic (congestive) heart failure: Secondary | ICD-10-CM | POA: Diagnosis not present

## 2017-03-31 LAB — POCT INR: INR: 2.4

## 2017-03-31 NOTE — Telephone Encounter (Signed)
FYi Pt had black stool last week , she reported it to the on call doc.  Has not had black stool since last week, np bleeding and no other symptoms      INR today 2.4

## 2017-03-31 NOTE — Telephone Encounter (Signed)
[facility] RN called and reported that patient had a black stool last week but at this time no bleeding and no other symptoms.  Do you think she needs an office visit?  Please advise.

## 2017-03-31 NOTE — Telephone Encounter (Signed)
Routing to greg, please advise if you want to make any changes to patient care, I will call patient back, thanks

## 2017-04-01 DIAGNOSIS — E1122 Type 2 diabetes mellitus with diabetic chronic kidney disease: Secondary | ICD-10-CM | POA: Diagnosis not present

## 2017-04-01 DIAGNOSIS — I495 Sick sinus syndrome: Secondary | ICD-10-CM | POA: Diagnosis not present

## 2017-04-01 DIAGNOSIS — I272 Pulmonary hypertension, unspecified: Secondary | ICD-10-CM | POA: Diagnosis not present

## 2017-04-01 DIAGNOSIS — I480 Paroxysmal atrial fibrillation: Secondary | ICD-10-CM | POA: Diagnosis not present

## 2017-04-01 DIAGNOSIS — I2510 Atherosclerotic heart disease of native coronary artery without angina pectoris: Secondary | ICD-10-CM | POA: Diagnosis not present

## 2017-04-01 DIAGNOSIS — D631 Anemia in chronic kidney disease: Secondary | ICD-10-CM | POA: Diagnosis not present

## 2017-04-01 DIAGNOSIS — E1140 Type 2 diabetes mellitus with diabetic neuropathy, unspecified: Secondary | ICD-10-CM | POA: Diagnosis not present

## 2017-04-01 DIAGNOSIS — N184 Chronic kidney disease, stage 4 (severe): Secondary | ICD-10-CM | POA: Diagnosis not present

## 2017-04-01 DIAGNOSIS — I5032 Chronic diastolic (congestive) heart failure: Secondary | ICD-10-CM | POA: Diagnosis not present

## 2017-04-01 DIAGNOSIS — I130 Hypertensive heart and chronic kidney disease with heart failure and stage 1 through stage 4 chronic kidney disease, or unspecified chronic kidney disease: Secondary | ICD-10-CM | POA: Diagnosis not present

## 2017-04-01 NOTE — Telephone Encounter (Signed)
Recommend a fecal occult blood test to ensure no blood in stool. Order will be placed to have patient complete at her convenience.

## 2017-04-01 NOTE — Addendum Note (Signed)
Addended by: Mauricio Po D on: 04/01/2017 01:24 PM   Modules accepted: Orders

## 2017-04-03 DIAGNOSIS — I272 Pulmonary hypertension, unspecified: Secondary | ICD-10-CM | POA: Diagnosis not present

## 2017-04-03 DIAGNOSIS — I480 Paroxysmal atrial fibrillation: Secondary | ICD-10-CM | POA: Diagnosis not present

## 2017-04-03 DIAGNOSIS — I130 Hypertensive heart and chronic kidney disease with heart failure and stage 1 through stage 4 chronic kidney disease, or unspecified chronic kidney disease: Secondary | ICD-10-CM | POA: Diagnosis not present

## 2017-04-03 DIAGNOSIS — E1140 Type 2 diabetes mellitus with diabetic neuropathy, unspecified: Secondary | ICD-10-CM | POA: Diagnosis not present

## 2017-04-03 DIAGNOSIS — E1122 Type 2 diabetes mellitus with diabetic chronic kidney disease: Secondary | ICD-10-CM | POA: Diagnosis not present

## 2017-04-03 DIAGNOSIS — I5032 Chronic diastolic (congestive) heart failure: Secondary | ICD-10-CM | POA: Diagnosis not present

## 2017-04-03 DIAGNOSIS — I495 Sick sinus syndrome: Secondary | ICD-10-CM | POA: Diagnosis not present

## 2017-04-03 DIAGNOSIS — N184 Chronic kidney disease, stage 4 (severe): Secondary | ICD-10-CM | POA: Diagnosis not present

## 2017-04-03 DIAGNOSIS — D631 Anemia in chronic kidney disease: Secondary | ICD-10-CM | POA: Diagnosis not present

## 2017-04-03 DIAGNOSIS — I2510 Atherosclerotic heart disease of native coronary artery without angina pectoris: Secondary | ICD-10-CM | POA: Diagnosis not present

## 2017-04-03 NOTE — Telephone Encounter (Signed)
A fecal occult test was placed. Please continue to monitor and if has additional dark stools or any bleeding to follow up or seek further care.

## 2017-04-03 NOTE — Telephone Encounter (Signed)
Patient states she has had no other episodes and understands to call back in if she would like to either have office visit to check stool or we can mail hemoccult cards for stool sample

## 2017-04-04 ENCOUNTER — Ambulatory Visit (INDEPENDENT_AMBULATORY_CARE_PROVIDER_SITE_OTHER): Payer: Medicare HMO | Admitting: Physician Assistant

## 2017-04-04 ENCOUNTER — Ambulatory Visit (INDEPENDENT_AMBULATORY_CARE_PROVIDER_SITE_OTHER): Payer: Medicare HMO | Admitting: Interventional Cardiology

## 2017-04-04 VITALS — BP 128/66 | HR 66 | Ht 72.0 in | Wt 242.0 lb

## 2017-04-04 DIAGNOSIS — Z950 Presence of cardiac pacemaker: Secondary | ICD-10-CM | POA: Diagnosis not present

## 2017-04-04 DIAGNOSIS — I5032 Chronic diastolic (congestive) heart failure: Secondary | ICD-10-CM

## 2017-04-04 DIAGNOSIS — I10 Essential (primary) hypertension: Secondary | ICD-10-CM

## 2017-04-04 DIAGNOSIS — Z86711 Personal history of pulmonary embolism: Secondary | ICD-10-CM

## 2017-04-04 DIAGNOSIS — I480 Paroxysmal atrial fibrillation: Secondary | ICD-10-CM

## 2017-04-04 LAB — POCT INR: INR: 3.2

## 2017-04-04 MED ORDER — METOPROLOL SUCCINATE ER 25 MG PO TB24: 25.0000 mg | ORAL_TABLET | Freq: Every day | ORAL | 3 refills | Status: DC

## 2017-04-04 NOTE — Progress Notes (Signed)
Cardiology Office Note Date:  04/04/2017  Patient ID:  [name] [name], [name] [name] [birth year], MRN [id number] PCP:  Hoyt Koch, MD  Cardiologist:  Dr. Rayann Heman   Chief Complaint: post hospital stay  History of Present Illness: [name] [name] is a 83 y.o. female with history of SSSx w/PPM, anxiety, AFib, chronic CHF (diastolic), CBP, DM, remote hx of DVT, HTN, chronic venous insufficiency comes in today to be seen for Dr. Rayann Heman, last seen by him 12/25/16, at that time despite amiodarone was still having a fair amount of AF, her BB was up-titrated and suspected that rate control strategies may be the ultimate POC, and not felt to be an ablation candidate.  Older hospital record notes some concern of ability to care for herself, though the family had reported strong support with daily home visits  She was seen by L.Dorene Ar, NP for ER f/u visit with c/o SOB, CXR with Stable cardiomegaly, changes of COPD and chronic bronchitis, aortic atherosclerosis and large hiatal hernia, she was found with fluid OL and recommended temp increase in her diuresis, suspected her PAF was contributing as well to her acute/chronic CHF.  The patient was seen in f/u by myself 01/10/17 accompanied by her daughter.  They reported then in  regards to her swelling, this had completely resolved.  Her weight was only down one pound but they report her initially as having severe swelling, and now back to baseline whih was none.  She fell unfortunately, 01/04/17.   She reported an event in the BR, urinated and upon standing feels like her legs just gave out.  She is certain she did not black out or faint, she recalls the event, did not hit her head or have an injury but was to weak to to get up.   In general since her hospital stay in January she doesn't feel like she has gotten back to her self.    It was noted that there was confusion in exactly how she was taking her medicines, particularly her diurtetic was mildly othostatic,  though still felt volume OL and planned for short course of increased lasix and planned for close f/u.  Rate response was programmed on in effort to improve her feeling of fatigue  She was seen in close f/u 01/20/17, she was feeling improvement in her SOB and much less edematous. Her lasix adjusted with continued waxing/waning edema, she was stioll having PAF with SR (APing 77% and her amiodarone continued with thought to do PFTs when fluid OL was resolved/better controlled.  She had June hospital visit with persistent and onoing functioinal decline, progressive fatigue and SOB, unable to get home PT arranged via PMD.  She was observed in the ER to have wide complex rhythm and EP was consulted.  She was in SR, though had been having PA.  Wide complex rhythm was able to be re-produced and felt to be pacing behavior 2/2 rate response and this was programmed off, she was not felt to be having VT.  She was observed upon getting up apparently byER staff to be quite weak and desaturated and admitted for observation discharged after a day or so, reportedly feeling some improvement.  Readmitted 03/18/17 with acutely worsening SOB, initially suspected to be pneumonia though CT findings were suspect to be amioarone toxicity, the amiodarone was stopped by medicine team and given Prednisone dose pack. And instructed for out patient pulmonary evaluation  She is doing much better, she is taking the steroid dose pack as directed, her  daughter accompanies her there is a home visiting RN and PT and a  Caregiver that now visit twice daily.  She suspects her Mom was over using her pain medicine that was contributing to her weakness and lethargy, and has been discontinued.  Both the patient and her daughter have seen a marked improvement.  The patient denies any kind of CP or palpitations, her SOB is steadily improving, no dizziness, near syncope or syncope.  She has seen her PMD and has been recommended/referred for  a  nephrology evaluation but was not referred to a pulmonologist  Device information: MDT dual chamber PPM, implanted 06/30/09, SSSx, Dr. Rayann Heman AF hx: AAD amiodarone, started Jan 2018 w/episode rapid AFlutter Hx of Flecainide d/c 2/2 CHF and marked PR prolongation to >458m  Past Medical History:  Diagnosis Date  . Acute gastric ulcer without mention of hemorrhage, perforation, or obstruction 2006   EGD  . Anxiety   . Atrial fibrillation (HCC)    flecainide; coumadin  . Breast cancer ([facility]    left  . Carotid artery disease ([facility]    130/16 RICA 00-10% LICA 493-23% //  [place]/73 RICA 12-20% LICA 625-42%>> FU 1 year   . Chronic diastolic heart failure (HCC)    a. echo 10/10: mild LVH, EF 55-60%, mild AI, mild MR, mild to mod LAE, mild RVE, severe RAE, mod TR, PASP 53  /  b.  Echo 3/14:  Mild LVH, EF 55%, Tr AI, MAC, mild MR, mod LAE, PASP 31, trivial eff. // c. echo 1/17: EF 55-60%, mild AI, MAC, mild MR, moderate BAE, moderate TR  . Chronic lower back pain   . Diabetic peripheral neuropathy ([facility] 03/09/2015  . Diverticulosis   . Esophagitis, unspecified 2006   EGD  . Hiatal hernia 2006   EGD  . History of nuclear stress test    a. Myoview 2/12: EF 69%, no scar or ischemia  . Hx of adenomatous colonic polyps   . Hypertension   . Mediastinal lymphadenopathy   . Memory difficulty 11/14/2016  . Mitral regurgitation    mild, echo, October, 2010  . Osteopenia   . Presence of permanent cardiac pacemaker   . Pulmonary embolus ([facility] 2006   2006, with DVT  . Pulmonary HTN ([facility]    53 mmHg echo, 2010, moderate TR, mild right ventricular enlargement  . Tachy-brady syndrome ([facility]    s/p pacer 07/2009  . Thyroid nodule    non-neoplastic goiter  . Type II diabetes mellitus ([facility]   . Venous insufficiency    Chronic    Past Surgical History:  Procedure Laterality Date  . ABDOMINAL HYSTERECTOMY    . ANKLE FRACTURE SURGERY Right   . BACK SURGERY    . BREAST LUMPECTOMY Left   .  FRACTURE SURGERY    . INSERT / REPLACE / REMOVE PACEMAKER  06/30/2009   MDT Adalpta L implanted by Dr BOlevia Perches . LUMBAR LAMINECTOMY  1973, 06/22/2011   "no hardware either time"    Current Outpatient Prescriptions  Medication Sig Dispense Refill  . Acetaminophen (TYLENOL 8 HOUR PO) Take by mouth.    . ALPRAZolam (XANAX) 0.25 MG tablet Take 1 tablet (0.25 mg total) by mouth 2 (two) times daily as needed for anxiety. 20 tablet 1  . blood glucose meter kit and supplies KIT Dispense based on patient and insurance preference. Use before breakfast and at bedtime. Diagnosis code: E11.9 1 each 2  . furosemide (LASIX) 80 MG tablet  Take 1 tablet (80 mg total) by mouth daily. TAKE 80 MG IN AM AND 40 MG IN PM 90 tablet 1  . gabapentin (NEURONTIN) 300 MG capsule Take 600 mg by mouth 2 (two) times daily.     Marland Kitchen HYDROcodone-acetaminophen (NORCO/VICODIN) 5-325 MG tablet Take 0.5-1 tablets by mouth 2 (two) times daily as needed for moderate pain. (Patient not taking: Reported on 03/26/2017) 30 tablet 0  . metoprolol succinate (TOPROL-XL) 25 MG 24 hr tablet Take 25 mg by mouth daily.    . potassium chloride 20 MEQ/15ML (10%) SOLN Take 20 mEq by mouth daily as needed (if have not eaten a banana).    . predniSONE (DELTASONE) 10 MG tablet Take 1 tablet (10 mg total) by mouth as directed. Take 40 mg by mouth for the next 2 days. Then take 30 mg by mouth for the next 4 days then take 20 mg by mouth for the next 4 days then take 10 mg by mouth for the next 4 days 32 tablet 0  . promethazine (PHENERGAN) 25 MG tablet Take 1 tablet (25 mg total) by mouth every 8 (eight) hours as needed for nausea or vomiting. 20 tablet 0  . pyridoxine (B-6) 200 MG tablet Take 200 mg by mouth daily.    . vitamin B-12 (CYANOCOBALAMIN) 500 MCG tablet Take 500 mcg by mouth daily.     Marland Kitchen warfarin (COUMADIN) 5 MG tablet Take 2.5 mg by mouth daily.     No current facility-administered medications for this visit.     Allergies:   Flecainide;  Morphine and related; and Cephalexin   Social History:  The patient  reports that she quit smoking about 18 years ago. Her smoking use included Cigarettes. She has a 47.00 pack-year smoking history. She has never used smokeless tobacco. She reports that she does not drink alcohol or use drugs.   Family History:  The patient's family history includes COPD in her father; Heart disease in her mother; Hypertension in her brother, daughter, father, mother, and sister.   ROS:  Please see the history of present illness.  All other systems are reviewed and otherwise negative.   PHYSICAL EXAM:  VS:  There were no vitals taken for this visit. BMI: There is no height or weight on file to calculate BMI. Well nourished, well developed, in no acute distress  HEENT: normocephalic, atraumatic  Neck: no JVD, carotid bruits or masses Cardiac:  RRR; no significant murmurs, no rubs, or gallops Lungs: CTA b/l, no wheezing, rhonchi or rales, no abnormal lungs sounds are appreciated today Abd: soft, nontender MS: no deformity or atrophy Ext: no edema b/l Skin: warm and dry, no rash Neuro:  No gross deficits appreciated Psych: euthymic mood, full affect  PPM site is stable, no tethering or discomfort   EKG:  Done 12/31/16: Apaced, V sensed PPM interrogation done by industry today by industry noted an increase in RV threshold, 1.75V _0 .7m.  In review it appears a trend upwards in the last month or so. Battery and other lead measurements are OK, She has had AMS episode, though is 94.6% AP/VP, she is atrial dependent by industry observation. Discussed with Dr. KCaryl Comes recommends repeating threshold using 153m this was 1.25V_1 , and 2:1 safety margin was programmed. Industry recommends reprogramming device to DDD to allow intrinsic conduction It appears during her last or previous to that her programming was changed to DDI to avoid the pacing behavior observed in the ER.   06/17/16: Gated myHalifaxtudy  Highlights  Nuclear stress EF: 69%.  There was no ST segment deviation noted during stress.  Defect 1: There is a small defect of mild severity present in the basal inferolateral and mid inferolateral location. This defect is partially reversible and could represent a very small area of ischemia. Clinical correlation recommended.  This is a low risk study.  The left ventricular ejection fraction is hyperdynamic (>65%).    04/13/16 ECHO Study Conclusions - Left ventricle: The cavity size was normal. There was mild concentric hypertrophy. Systolic function was normal. The estimated ejection fraction was in the range of 55% to 60%. Wall motion was normal; there were no regional wall motion abnormalities. Doppler parameters are consistent with restrictive physiology, indicative of decreased left ventricular diastolic compliance and/or increased left atrial pressure. Doppler parameters are consistent with elevated ventricular end-diastolic filling pressure. - Aortic valve: There was mild regurgitation. - Aortic root: The aortic root was normal in size. - Ascending aorta: The ascending aorta was normal in size. - Mitral valve: Calcified annulus. Mildly thickened leaflets . There was mild regurgitation. - Left atrium: The atrium was moderately dilated. - Right ventricle: Pacer wire or catheter noted in right ventricle. - Right atrium: Pacer wire or catheter noted in right atrium. - Tricuspid valve: There was moderate regurgitation. - Pulmonic valve: There was no regurgitation. - Pulmonary arteries: Systolic pressure was mildly increased. PA peak pressure: 38 mm Hg (S). - Inferior vena cava: The vessel was normal in size. - Pericardium, extracardiac: There was no pericardial effusion.    Recent Labs: 01/10/2017: TSH 1.650 03/18/2017: ALT 13 03/20/2017: Magnesium 2.2 03/21/2017: B Natriuretic Peptide 431.2; Hemoglobin 9.2; Platelets 392 03/26/2017: BUN 46;  Creatinine, Ser 2.09; Potassium 3.7; Sodium 139  No results found for requested labs within last 8760 hours.   Estimated Creatinine Clearance: 27.6 mL/min (A) (by C-G formula based on SCr of 2.09 mg/dL (H)).   Wt Readings from Last 3 Encounters:  03/26/17 239 lb (108.4 kg)  03/22/17 235 lb 3.2 oz (106.7 kg)  03/15/17 237 lb 9.6 oz (107.8 kg)     Other studies reviewed: Additional studies/records reviewed today include: summarized above  ASSESSMENT AND PLAN:  1. CHF disatolic     Her exam is euvolemic     She is reminded to refrain from salty foods      2. Paroxysmal AFib     CHA2Ds2Vasc is 5, on warfarin, monitored and managed with the coumadin clnic     She is off amiodarone with suspected pulm tox, feeling much better with steroid tx in progress     Her CT had other findings concerning given ex-smoker.  The patient and daughter were aware  We will refer her to [facility] pulmonary and discussed with hem the importance of this  3. HTN     No changes  4. PPM     As above.      I will discuss/review with Dr. Rayann Heman and have her f/u in a month, sooner if needed.  If no reprogramming is suggested can move this out.  Disposition:  As above  Current medicines are reviewed at length with the patient today.  The patient did not have any concerns regarding medicines.  Haywood Lasso, PA-C 04/04/2017 5:20 AM     [facility] [address] [place] [address] [phone] (office)  [phone] (fax)

## 2017-04-04 NOTE — Patient Instructions (Addendum)
Medication Instructions:   Your physician recommends that you continue on your current medications as directed. Please refer to the Current Medication list given to you today.   If you need a refill on your cardiac medications before your next appointment, please call your pharmacy.  Labwork:  NONE ORDERED  TODAY    Testing/Procedures: NONE ORDERED  TODAY    Follow-Up: IN ONE MONTH WITH URSUY ON A DAY  ALLRED OR EP DOCTOR IN THE OFFICE    Any Other Special Instructions Will Be Listed Below (If Applicable).

## 2017-04-10 ENCOUNTER — Ambulatory Visit (INDEPENDENT_AMBULATORY_CARE_PROVIDER_SITE_OTHER): Payer: Medicare HMO | Admitting: Cardiovascular Disease

## 2017-04-10 DIAGNOSIS — Z86711 Personal history of pulmonary embolism: Secondary | ICD-10-CM

## 2017-04-10 DIAGNOSIS — I495 Sick sinus syndrome: Secondary | ICD-10-CM | POA: Diagnosis not present

## 2017-04-10 DIAGNOSIS — E1122 Type 2 diabetes mellitus with diabetic chronic kidney disease: Secondary | ICD-10-CM | POA: Diagnosis not present

## 2017-04-10 DIAGNOSIS — D631 Anemia in chronic kidney disease: Secondary | ICD-10-CM | POA: Diagnosis not present

## 2017-04-10 DIAGNOSIS — E1140 Type 2 diabetes mellitus with diabetic neuropathy, unspecified: Secondary | ICD-10-CM | POA: Diagnosis not present

## 2017-04-10 DIAGNOSIS — I5032 Chronic diastolic (congestive) heart failure: Secondary | ICD-10-CM | POA: Diagnosis not present

## 2017-04-10 DIAGNOSIS — N184 Chronic kidney disease, stage 4 (severe): Secondary | ICD-10-CM | POA: Diagnosis not present

## 2017-04-10 DIAGNOSIS — I272 Pulmonary hypertension, unspecified: Secondary | ICD-10-CM | POA: Diagnosis not present

## 2017-04-10 DIAGNOSIS — I130 Hypertensive heart and chronic kidney disease with heart failure and stage 1 through stage 4 chronic kidney disease, or unspecified chronic kidney disease: Secondary | ICD-10-CM | POA: Diagnosis not present

## 2017-04-10 DIAGNOSIS — I480 Paroxysmal atrial fibrillation: Secondary | ICD-10-CM | POA: Diagnosis not present

## 2017-04-10 DIAGNOSIS — I2510 Atherosclerotic heart disease of native coronary artery without angina pectoris: Secondary | ICD-10-CM | POA: Diagnosis not present

## 2017-04-10 LAB — POCT INR: INR: 3.2

## 2017-04-12 ENCOUNTER — Other Ambulatory Visit: Payer: Self-pay | Admitting: Internal Medicine

## 2017-04-14 DIAGNOSIS — I495 Sick sinus syndrome: Secondary | ICD-10-CM | POA: Diagnosis not present

## 2017-04-14 DIAGNOSIS — E1140 Type 2 diabetes mellitus with diabetic neuropathy, unspecified: Secondary | ICD-10-CM | POA: Diagnosis not present

## 2017-04-14 DIAGNOSIS — I5032 Chronic diastolic (congestive) heart failure: Secondary | ICD-10-CM | POA: Diagnosis not present

## 2017-04-14 DIAGNOSIS — I480 Paroxysmal atrial fibrillation: Secondary | ICD-10-CM | POA: Diagnosis not present

## 2017-04-14 DIAGNOSIS — E1122 Type 2 diabetes mellitus with diabetic chronic kidney disease: Secondary | ICD-10-CM | POA: Diagnosis not present

## 2017-04-14 DIAGNOSIS — N184 Chronic kidney disease, stage 4 (severe): Secondary | ICD-10-CM | POA: Diagnosis not present

## 2017-04-14 DIAGNOSIS — I2510 Atherosclerotic heart disease of native coronary artery without angina pectoris: Secondary | ICD-10-CM | POA: Diagnosis not present

## 2017-04-14 DIAGNOSIS — I130 Hypertensive heart and chronic kidney disease with heart failure and stage 1 through stage 4 chronic kidney disease, or unspecified chronic kidney disease: Secondary | ICD-10-CM | POA: Diagnosis not present

## 2017-04-14 DIAGNOSIS — D631 Anemia in chronic kidney disease: Secondary | ICD-10-CM | POA: Diagnosis not present

## 2017-04-14 DIAGNOSIS — I272 Pulmonary hypertension, unspecified: Secondary | ICD-10-CM | POA: Diagnosis not present

## 2017-04-14 NOTE — Telephone Encounter (Signed)
Last dispensed 03/01/2017 with 20 tabs for 10 days

## 2017-04-17 ENCOUNTER — Telehealth: Payer: Self-pay | Admitting: Internal Medicine

## 2017-04-17 ENCOUNTER — Ambulatory Visit (INDEPENDENT_AMBULATORY_CARE_PROVIDER_SITE_OTHER): Payer: Medicare HMO | Admitting: Pharmacist

## 2017-04-17 DIAGNOSIS — D631 Anemia in chronic kidney disease: Secondary | ICD-10-CM | POA: Diagnosis not present

## 2017-04-17 DIAGNOSIS — I495 Sick sinus syndrome: Secondary | ICD-10-CM | POA: Diagnosis not present

## 2017-04-17 DIAGNOSIS — I5032 Chronic diastolic (congestive) heart failure: Secondary | ICD-10-CM | POA: Diagnosis not present

## 2017-04-17 DIAGNOSIS — N184 Chronic kidney disease, stage 4 (severe): Secondary | ICD-10-CM | POA: Diagnosis not present

## 2017-04-17 DIAGNOSIS — Z86711 Personal history of pulmonary embolism: Secondary | ICD-10-CM

## 2017-04-17 DIAGNOSIS — E1122 Type 2 diabetes mellitus with diabetic chronic kidney disease: Secondary | ICD-10-CM | POA: Diagnosis not present

## 2017-04-17 DIAGNOSIS — I272 Pulmonary hypertension, unspecified: Secondary | ICD-10-CM | POA: Diagnosis not present

## 2017-04-17 DIAGNOSIS — I480 Paroxysmal atrial fibrillation: Secondary | ICD-10-CM | POA: Diagnosis not present

## 2017-04-17 DIAGNOSIS — E1140 Type 2 diabetes mellitus with diabetic neuropathy, unspecified: Secondary | ICD-10-CM | POA: Diagnosis not present

## 2017-04-17 DIAGNOSIS — I2510 Atherosclerotic heart disease of native coronary artery without angina pectoris: Secondary | ICD-10-CM | POA: Diagnosis not present

## 2017-04-17 DIAGNOSIS — I130 Hypertensive heart and chronic kidney disease with heart failure and stage 1 through stage 4 chronic kidney disease, or unspecified chronic kidney disease: Secondary | ICD-10-CM | POA: Diagnosis not present

## 2017-04-17 LAB — POCT INR: INR: 3.2

## 2017-04-17 NOTE — Telephone Encounter (Signed)
Olivia Mackie calling from [facility]), states that her message was not urgent. Olivia Mackie would like to report that she saw patient today and patient was stable. Patient reported on Monday that she was short of breath and had gained a few pounds due eating processed meats. Olivia Mackie states that patient back down to baseline for weight and is stable.

## 2017-04-17 NOTE — Telephone Encounter (Signed)
Will forward to Dr. Rayann Heman and his nurse to make them aware.

## 2017-04-23 ENCOUNTER — Ambulatory Visit (INDEPENDENT_AMBULATORY_CARE_PROVIDER_SITE_OTHER): Payer: Self-pay | Admitting: Internal Medicine

## 2017-04-23 DIAGNOSIS — E1122 Type 2 diabetes mellitus with diabetic chronic kidney disease: Secondary | ICD-10-CM | POA: Diagnosis not present

## 2017-04-23 DIAGNOSIS — Z86711 Personal history of pulmonary embolism: Secondary | ICD-10-CM

## 2017-04-23 DIAGNOSIS — N184 Chronic kidney disease, stage 4 (severe): Secondary | ICD-10-CM | POA: Diagnosis not present

## 2017-04-23 DIAGNOSIS — I5032 Chronic diastolic (congestive) heart failure: Secondary | ICD-10-CM | POA: Diagnosis not present

## 2017-04-23 DIAGNOSIS — I130 Hypertensive heart and chronic kidney disease with heart failure and stage 1 through stage 4 chronic kidney disease, or unspecified chronic kidney disease: Secondary | ICD-10-CM | POA: Diagnosis not present

## 2017-04-23 DIAGNOSIS — D631 Anemia in chronic kidney disease: Secondary | ICD-10-CM | POA: Diagnosis not present

## 2017-04-23 DIAGNOSIS — I495 Sick sinus syndrome: Secondary | ICD-10-CM | POA: Diagnosis not present

## 2017-04-23 DIAGNOSIS — E1140 Type 2 diabetes mellitus with diabetic neuropathy, unspecified: Secondary | ICD-10-CM | POA: Diagnosis not present

## 2017-04-23 DIAGNOSIS — I2510 Atherosclerotic heart disease of native coronary artery without angina pectoris: Secondary | ICD-10-CM | POA: Diagnosis not present

## 2017-04-23 DIAGNOSIS — I480 Paroxysmal atrial fibrillation: Secondary | ICD-10-CM | POA: Diagnosis not present

## 2017-04-23 DIAGNOSIS — I272 Pulmonary hypertension, unspecified: Secondary | ICD-10-CM | POA: Diagnosis not present

## 2017-04-23 LAB — POCT INR: INR: 4.1

## 2017-04-24 ENCOUNTER — Telehealth: Payer: Self-pay | Admitting: Internal Medicine

## 2017-04-24 ENCOUNTER — Other Ambulatory Visit: Payer: Self-pay | Admitting: *Deleted

## 2017-04-24 MED ORDER — WARFARIN SODIUM 2.5 MG PO TABS: 2.5000 mg | ORAL_TABLET | Freq: Every day | ORAL | 3 refills | Status: DC

## 2017-04-24 NOTE — Telephone Encounter (Signed)
New message    Linus Orn, [facility] Nurse is calling. Pt states she gained 3 pounds since yesterday. No SOB, chest pain or swelling. Pt states she over did it with sodium intake on yesterday. Pt denied home health nurese coming out today to check on her, but will contact nurse tomorrow about her weight.

## 2017-04-24 NOTE — Telephone Encounter (Signed)
Per pt feels fine did eat pre made frozen hamburger yesterday and according to pt home health nurse reviewed packaging and told pt to not eat anymore. Will continue to monitor and will call if needed .

## 2017-04-25 ENCOUNTER — Telehealth: Payer: Self-pay | Admitting: Nurse Practitioner

## 2017-04-25 NOTE — Telephone Encounter (Signed)
Patient's daughter dropped off FMLA forms to be filled out. She is wanting FMLA to be able to take her mother to doctor appointments, home care & hospital flare ups. You saw this patient on 6/27 for a Hospital FU. Please advise while Dr.Crawford is out of office. Thank you.

## 2017-04-28 NOTE — Telephone Encounter (Signed)
Ok to complete for 2-3days per week.

## 2017-04-29 NOTE — Telephone Encounter (Signed)
FMLA forms have been completed &Signed. Patients daughter has been informed. She will come by office and pick up forms.

## 2017-04-30 ENCOUNTER — Ambulatory Visit (INDEPENDENT_AMBULATORY_CARE_PROVIDER_SITE_OTHER): Payer: Medicare HMO | Admitting: Cardiovascular Disease

## 2017-04-30 DIAGNOSIS — I480 Paroxysmal atrial fibrillation: Secondary | ICD-10-CM | POA: Diagnosis not present

## 2017-04-30 DIAGNOSIS — E1140 Type 2 diabetes mellitus with diabetic neuropathy, unspecified: Secondary | ICD-10-CM | POA: Diagnosis not present

## 2017-04-30 DIAGNOSIS — D631 Anemia in chronic kidney disease: Secondary | ICD-10-CM | POA: Diagnosis not present

## 2017-04-30 DIAGNOSIS — I5032 Chronic diastolic (congestive) heart failure: Secondary | ICD-10-CM | POA: Diagnosis not present

## 2017-04-30 DIAGNOSIS — E1122 Type 2 diabetes mellitus with diabetic chronic kidney disease: Secondary | ICD-10-CM | POA: Diagnosis not present

## 2017-04-30 DIAGNOSIS — N184 Chronic kidney disease, stage 4 (severe): Secondary | ICD-10-CM | POA: Diagnosis not present

## 2017-04-30 DIAGNOSIS — I130 Hypertensive heart and chronic kidney disease with heart failure and stage 1 through stage 4 chronic kidney disease, or unspecified chronic kidney disease: Secondary | ICD-10-CM | POA: Diagnosis not present

## 2017-04-30 DIAGNOSIS — I495 Sick sinus syndrome: Secondary | ICD-10-CM | POA: Diagnosis not present

## 2017-04-30 DIAGNOSIS — I272 Pulmonary hypertension, unspecified: Secondary | ICD-10-CM | POA: Diagnosis not present

## 2017-04-30 DIAGNOSIS — I2510 Atherosclerotic heart disease of native coronary artery without angina pectoris: Secondary | ICD-10-CM | POA: Diagnosis not present

## 2017-04-30 DIAGNOSIS — Z86711 Personal history of pulmonary embolism: Secondary | ICD-10-CM

## 2017-04-30 LAB — POCT INR: INR: 2.7

## 2017-05-14 ENCOUNTER — Telehealth: Payer: Self-pay | Admitting: Internal Medicine

## 2017-05-14 ENCOUNTER — Ambulatory Visit (INDEPENDENT_AMBULATORY_CARE_PROVIDER_SITE_OTHER): Payer: Medicare HMO | Admitting: Pharmacist

## 2017-05-14 DIAGNOSIS — E1140 Type 2 diabetes mellitus with diabetic neuropathy, unspecified: Secondary | ICD-10-CM | POA: Diagnosis not present

## 2017-05-14 DIAGNOSIS — I480 Paroxysmal atrial fibrillation: Secondary | ICD-10-CM | POA: Diagnosis not present

## 2017-05-14 DIAGNOSIS — Z86711 Personal history of pulmonary embolism: Secondary | ICD-10-CM

## 2017-05-14 DIAGNOSIS — I130 Hypertensive heart and chronic kidney disease with heart failure and stage 1 through stage 4 chronic kidney disease, or unspecified chronic kidney disease: Secondary | ICD-10-CM | POA: Diagnosis not present

## 2017-05-14 DIAGNOSIS — N184 Chronic kidney disease, stage 4 (severe): Secondary | ICD-10-CM | POA: Diagnosis not present

## 2017-05-14 DIAGNOSIS — E1122 Type 2 diabetes mellitus with diabetic chronic kidney disease: Secondary | ICD-10-CM | POA: Diagnosis not present

## 2017-05-14 DIAGNOSIS — D631 Anemia in chronic kidney disease: Secondary | ICD-10-CM | POA: Diagnosis not present

## 2017-05-14 DIAGNOSIS — I5032 Chronic diastolic (congestive) heart failure: Secondary | ICD-10-CM | POA: Diagnosis not present

## 2017-05-14 DIAGNOSIS — I2510 Atherosclerotic heart disease of native coronary artery without angina pectoris: Secondary | ICD-10-CM | POA: Diagnosis not present

## 2017-05-14 DIAGNOSIS — R911 Solitary pulmonary nodule: Secondary | ICD-10-CM | POA: Diagnosis not present

## 2017-05-14 DIAGNOSIS — I272 Pulmonary hypertension, unspecified: Secondary | ICD-10-CM | POA: Diagnosis not present

## 2017-05-14 DIAGNOSIS — I495 Sick sinus syndrome: Secondary | ICD-10-CM | POA: Diagnosis not present

## 2017-05-14 LAB — POCT INR: INR: 2.7

## 2017-05-14 NOTE — Telephone Encounter (Signed)
[name] [name] from [facility] called stating that they would like to re-certifiy the pt to continue care for another 3 or 4 weeks. Her caregiver has not been checking the pt's blood sugar. [name] [name] checked it while she was there this morning at it was 314. She asked the caregiver to keep an eye on it and report the readings back to [place]. She is wanting to extend her visits with her to keep an eye on her blood sugar and her coumadin.

## 2017-05-14 NOTE — Telephone Encounter (Signed)
Chart reviewed and appropriate for extension and re-certification.

## 2017-05-15 NOTE — Telephone Encounter (Signed)
Notified Olivia Mackie w/MD response...Johny Chess

## 2017-05-19 ENCOUNTER — Encounter: Payer: Self-pay | Admitting: Adult Health

## 2017-05-19 ENCOUNTER — Ambulatory Visit (INDEPENDENT_AMBULATORY_CARE_PROVIDER_SITE_OTHER): Payer: Medicare HMO | Admitting: Adult Health

## 2017-05-19 VITALS — Ht 72.0 in | Wt 251.6 lb

## 2017-05-19 DIAGNOSIS — R413 Other amnesia: Secondary | ICD-10-CM

## 2017-05-19 DIAGNOSIS — G629 Polyneuropathy, unspecified: Secondary | ICD-10-CM

## 2017-05-19 DIAGNOSIS — Z5181 Encounter for therapeutic drug level monitoring: Secondary | ICD-10-CM

## 2017-05-19 NOTE — Progress Notes (Signed)
PATIENT: [name] [name] DOB: 10/15/1930  REASON FOR VISIT: follow up- memory HISTORY FROM: patient  HISTORY OF PRESENT ILLNESS: Today 05/19/17 [name] [name] is an 83 year old female with a history of memory disturbance. She returns today for follow-up. At the last visit she was started on Aricept however this was never started because she was also on amiodarone. Aricept was discontinued and Namenda was called in. The daughter states that they did pick up the prescription however when the patient with side effects she decided that she did not want to be on any additional medication. Therefore she never started Namenda. The patient currently lives at home alone. She does have a nurse that comes in the morning and the evenings to help her with her medications and getting in and out of the bath. Patient reports good appetite. Denies any trouble sleeping. Reports good mood most of the time. Her daughter reports that she is very repetitive. She often does not remember conversations. The patient continues to have numbness and tingling in the fingers and lower extremities. She is back on gabapentin taking 300 mg twice a day. Patient returns today for an evaluation.  HISTORY 11/14/16: [name] [name] is an 83 year old right-handed black female with a history of borderline diabetes and she has had a recent hospitalization for atrial fibrillation. This occurred on 10/14/2016. During that hospitalization, the patient was given medications for nausea, she was taken off of her gabapentin, and subsequently she developed what sounds like a low-grade delirium state. The patient became somewhat agitated, she was pulling out IVs. A CT scan of the brain was done, this has been reviewed online and appears to show fairly extensive chronic white matter changes. The patient has been treated with Coumadin for her atrial fibrillation. She has returned home, she is now functioning at her usual baseline. She remains off of  gabapentin and her peripheral neuropathy discomfort has significantly worsened and her back pain has returned. The patient walks with a cane, she is not having recent falls. The patient reports numbness associated with her peripheral neuropathy up to the knees bilaterally, otherwise no new numbness or new weakness has been noted. The patient generally will sleep fairly well at night, she denies any issues controlling the bowels or the bladder. The patient comes to the office today with her daughter who does report some problems with repeating things, and some difficulty with naming on the part of the patient. The patient does not operate a motor vehicle mainly because of her peripheral neuropathy. She is able to manage her medications and appointments and her finances.   REVIEW OF SYSTEMS: Out of a complete 14 system review of symptoms, the patient complains only of the following symptoms, and all other reviewed systems are negative.  Chills, fatigue, runny nose, shortness of breath, choking, leg swelling, nausea, back pain, walking difficulty, confusion, nervous/anxious, numbness  ALLERGIES: Allergies  Allergen Reactions  . Amiodarone Nausea Only and Shortness Of Breath  . Flecainide Shortness Of Breath and Other (See Comments)    toxicity  . Morphine And Related Other (See Comments)    Family requests no morphine due to past reaction. Pt stated it made her sick.  . Cephalexin Nausea And Vomiting    HOME MEDICATIONS: Outpatient Medications Prior to Visit  Medication Sig Dispense Refill  . Acetaminophen (TYLENOL 8 HOUR PO) Take by mouth.    . ALPRAZolam (XANAX) 0.25 MG tablet TAKE 1 TABLET BY MOUTH TWICE A DAY AS NEEDED FOR ANXIETY *CAN FILL 01-28-17  20 tablet 1  . blood glucose meter kit and supplies KIT Dispense based on patient and insurance preference. Use before breakfast and at bedtime. Diagnosis code: E11.9 1 each 2  . furosemide (LASIX) 80 MG tablet Take 1 tablet (80 mg total) by  mouth daily. TAKE 80 MG IN AM AND 40 MG IN PM 90 tablet 1  . gabapentin (NEURONTIN) 300 MG capsule Take 600 mg by mouth 2 (two) times daily.     . metoprolol succinate (TOPROL-XL) 25 MG 24 hr tablet Take 1 tablet (25 mg total) by mouth daily. 30 tablet 3  . potassium chloride 20 MEQ/15ML (10%) SOLN Take 20 mEq by mouth daily as needed (if have not eaten a banana).    . promethazine (PHENERGAN) 25 MG tablet Take 1 tablet (25 mg total) by mouth every 8 (eight) hours as needed for nausea or vomiting. 20 tablet 0  . pyridoxine (B-6) 200 MG tablet Take 200 mg by mouth daily.    . vitamin B-12 (CYANOCOBALAMIN) 500 MCG tablet Take 500 mcg by mouth daily.     Marland Kitchen warfarin (COUMADIN) 2.5 MG tablet Take 1 tablet (2.5 mg total) by mouth daily. (Patient taking differently: Take 2.5 mg by mouth daily. 1.79m on Monday.) 30 tablet 3  . predniSONE (DELTASONE) 10 MG tablet Take 1 tablet (10 mg total) by mouth as directed. Take 40 mg by mouth for the next 2 days. Then take 30 mg by mouth for the next 4 days then take 20 mg by mouth for the next 4 days then take 10 mg by mouth for the next 4 days (Patient not taking: Reported on 05/19/2017) 32 tablet 0   No facility-administered medications prior to visit.     PAST MEDICAL HISTORY: Past Medical History:  Diagnosis Date  . Acute gastric ulcer without mention of hemorrhage, perforation, or obstruction 2006   EGD  . Anxiety   . Atrial fibrillation (HCC)    flecainide; coumadin  . Breast cancer ([place]    left  . Carotid artery disease ([place]    168/12 RICA 07-51% LICA 470-01% //  [place]/94 RICA 14-96% LICA 675-91%>> FU 1 year   . Chronic diastolic heart failure (HCC)    a. echo 10/10: mild LVH, EF 55-60%, mild AI, mild MR, mild to mod LAE, mild RVE, severe RAE, mod TR, PASP 53  /  b.  Echo 3/14:  Mild LVH, EF 55%, Tr AI, MAC, mild MR, mod LAE, PASP 31, trivial eff. // c. echo 1/17: EF 55-60%, mild AI, MAC, mild MR, moderate BAE, moderate TR  . Chronic lower  back pain   . Diabetic peripheral neuropathy ([facility] 03/09/2015  . Diverticulosis   . Esophagitis, unspecified 2006   EGD  . Hiatal hernia 2006   EGD  . History of nuclear stress test    a. Myoview 2/12: EF 69%, no scar or ischemia  . Hx of adenomatous colonic polyps   . Hypertension   . Mediastinal lymphadenopathy   . Memory difficulty 11/14/2016  . Mitral regurgitation    mild, echo, October, 2010  . Osteopenia   . Presence of permanent cardiac pacemaker   . Pulmonary embolus ([facility] 2006   2006, with DVT  . Pulmonary HTN ([place]    53 mmHg echo, 2010, moderate TR, mild right ventricular enlargement  . Tachy-brady syndrome ([place]    s/p pacer 07/2009  . Thyroid nodule    non-neoplastic goiter  . Type II diabetes mellitus (  [place])   . Venous insufficiency    Chronic    PAST SURGICAL HISTORY: Past Surgical History:  Procedure Laterality Date  . ABDOMINAL HYSTERECTOMY    . ANKLE FRACTURE SURGERY Right   . BACK SURGERY    . BREAST LUMPECTOMY Left   . FRACTURE SURGERY    . INSERT / REPLACE / REMOVE PACEMAKER  06/30/2009   MDT Adalpta L implanted by Dr Olevia Perches  . LUMBAR LAMINECTOMY  1973, 06/22/2011   "no hardware either time"    FAMILY HISTORY: Family History  Problem Relation Age of Onset  . Heart disease Mother   . Hypertension Mother   . COPD Father   . Hypertension Father   . Hypertension Sister   . Hypertension Brother   . Hypertension Daughter   . Colon cancer Neg Hx   . Heart attack Neg Hx   . Stroke Neg Hx     SOCIAL HISTORY: Social History   Social History  . Marital status: Widowed    Spouse name: N/A  . Number of children: 3  . Years of education: N/A   Occupational History  . Retired     Social History Main Topics  . Smoking status: Former Smoker    Packs/day: 1.00    Years: 47.00    Types: Cigarettes    Quit date: 10/26/1998  . Smokeless tobacco: Never Used  . Alcohol use No     Comment: 10/14/2016 "I'll have a beer q once in awhile"  . Drug  use: No  . Sexual activity: No   Other Topics Concern  . Not on file   Social History Narrative   Widowed.  Lives in [place] by herself.  Avoids caffeine.  Very active, taking care of her brother who also lives by himself and is in his late 11's.      PHYSICAL EXAM  Vitals:   05/19/17 1446  Weight: 251 lb 9.6 oz (114.1 kg)  Height: 6' (1.829 m)   Body mass index is 34.12 kg/m.   MMSE - Mini Mental State Exam 05/19/2017 11/14/2016  Orientation to time 4 4  Orientation to Place 5 4  Registration 3 3  Attention/ Calculation 0 3  Recall 0 2  Language- name 2 objects 2 2  Language- repeat 1 1  Language- follow 3 step command 3 3  Language- read & follow direction 1 1  Write a sentence 1 1  Copy design 0 1  Total score 20 25     Generalized: Well developed, in no acute distress   Neurological examination  Mentation: Alert oriented to time, place, history taking. Follows all commands speech and language fluent Cranial nerve II-XII: Pupils were equal round reactive to light. Extraocular movements were full, visual field were full on confrontational test. Facial sensation and strength were normal. Uvula tongue midline. Head turning and shoulder shrug  were normal and symmetric. Motor: The motor testing reveals 5 over 5 strength of all 4 extremities. Good symmetric motor tone is noted throughout.  Sensory: Sensory testing is intact to soft touch on all 4 extremities. No evidence of extinction is noted.  Coordination: Cerebellar testing reveals good finger-nose-finger and heel-to-shin bilaterally.  Gait and station: Patient uses a Rollator when ambulating. Reflexes: Deep tendon reflexes are symmetric and normal bilaterally.   DIAGNOSTIC DATA (LABS, IMAGING, TESTING) - I reviewed patient records, labs, notes, testing and imaging myself where available.  Lab Results  Component Value Date   WBC 4.8 03/21/2017   HGB  9.2 (L) 03/21/2017   HCT 30.3 (L) 03/21/2017   MCV 78.5  03/21/2017   PLT 392 03/21/2017      Component Value Date/Time   NA 139 03/26/2017 1225   NA 142 01/20/2017 1653   NA 139 03/22/2013 1015   K 3.7 03/26/2017 1225   K 3.7 03/22/2013 1015   CL 101 03/26/2017 1225   CL 101 03/22/2013 1015   CO2 31 03/26/2017 1225   CO2 26 03/22/2013 1015   GLUCOSE 176 (H) 03/26/2017 1225   GLUCOSE 197 (H) 03/22/2013 1015   BUN 46 (H) 03/26/2017 1225   BUN 29 (H) 01/20/2017 1653   BUN 23.2 03/22/2013 1015   CREATININE 2.09 (H) 03/26/2017 1225   CREATININE 1.60 (H) 04/23/2016 1657   CREATININE 1.3 (H) 03/22/2013 1015   CALCIUM 9.4 03/26/2017 1225   CALCIUM 9.8 03/22/2013 1015   PROT 7.4 03/18/2017 1508   PROT 8.0 01/10/2017 1637   PROT 7.6 03/22/2013 1015   ALBUMIN 3.4 (L) 03/18/2017 1508   ALBUMIN 4.3 01/10/2017 1637   ALBUMIN 3.5 03/22/2013 1015   AST 19 03/18/2017 1508   AST 16 03/22/2013 1015   ALT 13 (L) 03/18/2017 1508   ALT 12 03/22/2013 1015   ALKPHOS 115 03/18/2017 1508   ALKPHOS 102 03/22/2013 1015   BILITOT 0.7 03/18/2017 1508   BILITOT 0.6 01/10/2017 1637   BILITOT 0.60 03/22/2013 1015   GFRNONAA 17 (L) 03/22/2017 0018   GFRNONAA 34 (L) 11/30/2011 0748   GFRAA 20 (L) 03/22/2017 0018   GFRAA 41 (L) 11/30/2011 0748    Lab Results  Component Value Date   HGBA1C 6.3 (H) 10/14/2016   Lab Results  Component Value Date   VITAMINB12 623 03/18/2017   Lab Results  Component Value Date   TSH 1.650 01/10/2017      ASSESSMENT AND PLAN 83 y.o. year old female  has a past medical history of Acute gastric ulcer without mention of hemorrhage, perforation, or obstruction (2006); Anxiety; Atrial fibrillation ([place]); Breast cancer ([place]); Carotid artery disease ([place]); Chronic diastolic heart failure ([place]); Chronic lower back pain; Diabetic peripheral neuropathy ([place]) (03/09/2015); Diverticulosis; Esophagitis, unspecified (2006); Hiatal hernia (2006); History of nuclear stress test; adenomatous colonic polyps; Hypertension; Mediastinal  lymphadenopathy; Memory difficulty (11/14/2016); Mitral regurgitation; Osteopenia; Presence of permanent cardiac pacemaker; Pulmonary embolus ([place]) (2006); Pulmonary HTN ([place]); Tachy-brady syndrome ([place]); Thyroid nodule; Type II diabetes mellitus ([place]); and Venous insufficiency. here with:  1. Memory disturbance 2. Neuropathy  The patient's memory score has declined slightly. In June, blood work indicated abnormal kidney function. I will recheck blood work today. Pending the blood work we may consider trying Namenda again. Patient will continue on current dose of gabapentin. Patient and her daughter verbalized understanding. She will follow-up in 6 months or sooner.   Ward Givens, MSN, NP-C 05/19/2017, 3:00 PM [facility] [address], [place], [place] [address] [phone]

## 2017-05-19 NOTE — Progress Notes (Signed)
I have read the note, and I agree with the clinical assessment and plan.  WILLIS,CHARLES KEITH   

## 2017-05-19 NOTE — Patient Instructions (Signed)
Your Plan:  We will check blood work today to evaluate kidney function then may consider starting Namenda  Thank you for coming to see [place] at [facility]. I hope we have been able to provide you high quality care today.  You may receive a patient satisfaction survey over the next few weeks. We would appreciate your feedback and comments so that we may continue to improve ourselves and the health of our patients.

## 2017-05-20 ENCOUNTER — Ambulatory Visit (INDEPENDENT_AMBULATORY_CARE_PROVIDER_SITE_OTHER): Payer: Medicare HMO | Admitting: Internal Medicine

## 2017-05-20 ENCOUNTER — Other Ambulatory Visit (INDEPENDENT_AMBULATORY_CARE_PROVIDER_SITE_OTHER): Payer: Medicare HMO

## 2017-05-20 ENCOUNTER — Ambulatory Visit (INDEPENDENT_AMBULATORY_CARE_PROVIDER_SITE_OTHER)
Admission: RE | Admit: 2017-05-20 | Discharge: 2017-05-20 | Disposition: A | Discharge status: Home / Self Care | Payer: Medicare HMO | Source: Ambulatory Visit | Attending: Internal Medicine | Admitting: Internal Medicine

## 2017-05-20 ENCOUNTER — Encounter: Payer: Self-pay | Admitting: Internal Medicine

## 2017-05-20 VITALS — BP 130/70 | HR 60 | Ht 72.0 in | Wt 250.0 lb

## 2017-05-20 DIAGNOSIS — R911 Solitary pulmonary nodule: Secondary | ICD-10-CM

## 2017-05-20 DIAGNOSIS — R0602 Shortness of breath: Secondary | ICD-10-CM | POA: Diagnosis not present

## 2017-05-20 DIAGNOSIS — I272 Pulmonary hypertension, unspecified: Secondary | ICD-10-CM | POA: Diagnosis not present

## 2017-05-20 DIAGNOSIS — R0609 Other forms of dyspnea: Secondary | ICD-10-CM

## 2017-05-20 LAB — COMPREHENSIVE METABOLIC PANEL
ALT: 9 IU/L (ref 0–32)
AST: 18 IU/L (ref 0–40)
Albumin/Globulin Ratio: 1.4 (ref 1.2–2.2)
Albumin: 4.1 g/dL (ref 3.5–4.7)
Alkaline Phosphatase: 105 IU/L (ref 39–117)
BILIRUBIN TOTAL: 0.4 mg/dL (ref 0.0–1.2)
BUN/Creatinine Ratio: 13 (ref 12–28)
BUN: 24 mg/dL (ref 8–27)
CALCIUM: 9.6 mg/dL (ref 8.7–10.3)
CHLORIDE: 101 mmol/L (ref 96–106)
CO2: 25 mmol/L (ref 20–29)
CREATININE: 1.85 mg/dL — AB (ref 0.57–1.00)
GFR, EST AFRICAN AMERICAN: 28 mL/min/{1.73_m2} — AB (ref >59)
GFR, EST NON AFRICAN AMERICAN: 25 mL/min/{1.73_m2} — AB (ref >59)
GLUCOSE: 140 mg/dL — AB (ref 65–99)
Globulin, Total: 3 g/dL (ref 1.5–4.5)
Potassium: 4.5 mmol/L (ref 3.5–5.2)
Sodium: 143 mmol/L (ref 134–144)
TOTAL PROTEIN: 7.1 g/dL (ref 6.0–8.5)

## 2017-05-20 LAB — CBC WITH DIFFERENTIAL/PLATELET
BASOS ABS: 0 10*3/uL (ref 0.0–0.1)
Basophils Relative: 0.9 % (ref 0.0–3.0)
Eosinophils Absolute: 0.2 10*3/uL (ref 0.0–0.7)
Eosinophils Relative: 3.6 % (ref 0.0–5.0)
HCT: 32.4 % — ABNORMAL LOW (ref 36.0–46.0)
Hemoglobin: 10.3 g/dL — ABNORMAL LOW (ref 12.0–15.0)
LYMPHS PCT: 20.5 % (ref 12.0–46.0)
Lymphs Abs: 1 10*3/uL (ref 0.7–4.0)
MCHC: 31.8 g/dL (ref 30.0–36.0)
MCV: 84.7 fl (ref 78.0–100.0)
MONOS PCT: 11.9 % (ref 3.0–12.0)
Monocytes Absolute: 0.6 10*3/uL (ref 0.1–1.0)
NEUTROS ABS: 3 10*3/uL (ref 1.4–7.7)
NEUTROS PCT: 63.1 % (ref 43.0–77.0)
Platelets: 324 10*3/uL (ref 150.0–400.0)
RBC: 3.82 Mil/uL — ABNORMAL LOW (ref 3.87–5.11)
RDW: 24.3 % — ABNORMAL HIGH (ref 11.5–15.5)
WBC: 4.8 10*3/uL (ref 4.0–10.5)

## 2017-05-20 LAB — SEDIMENTATION RATE: SED RATE: 93 mm/h — AB (ref 0–30)

## 2017-05-20 LAB — BRAIN NATRIURETIC PEPTIDE: Pro B Natriuretic peptide (BNP): 387 pg/mL — ABNORMAL HIGH (ref 0.0–100.0)

## 2017-05-20 LAB — TSH: TSH: 1.94 u[IU]/mL (ref 0.35–4.50)

## 2017-05-20 NOTE — Patient Instructions (Signed)
Please remember to go to the lab and x-ray department downstairs in the basement  for your tests - we will call you with the results when they are available.     Please schedule a follow up office visit in 6 weeks, call sooner if needed with pfts

## 2017-05-20 NOTE — Progress Notes (Signed)
Subjective:     Patient ID: [name] [name], female   DOB: [birth year],    MRN: [id number]  HPI  83 yo [place] from [place] healthy as kid/ healthy as adult with some seasona rhinitis in her 39s and stopped smoking in early 2000s  With PE ? Date and improved on blood thinner with chronicdoe attributed to caf/diastolic dysfunction  then summer 2018  Increasing sob and swelling dx   better on diurectics / amio stopped June 2018 and referred to pulmonary clinic 05/20/2017 by Tommye Standard PA for cardiology s/p admit:   Admit date: 03/18/2017 Discharge date: 03/22/2017      Recommendations for Outpatient Follow-up:  1. Monitor blood sugars 2. Amiodarone discontinued secondary to amiodarone toxicity suspecting it on CT imaging study of chest. Patient is to complete 2 week prednisone taper 3. Consider referring patient to pulmonologist for further evaluation recommendations. 4. Ensure patient follows up with cardiology given that amiodarone was discontinued. No heart rate abnormalities within 24 hours after discontinuation of amiodarone. 5. Ensure patient gets follow-up for pulmonary nodule 6. Monitor serum creatinine   Discharge Diagnoses:  Active Problems:   Pulmonary HTN (HCC)   Hypertension   Sick sinus syndrome (HCC)   Chronic diastolic CHF (congestive heart failure) (HCC)   CRD (chronic renal disease), stage III   Dyspnea   Diabetes mellitus type 2, noninsulin dependent (HCC)   A-fib (HCC)   Pulmonary nodule   Amiodarone toxicity   Discharge Condition: stable  Diet recommendation: heart healthy carb modified       Filed Weights   03/20/17 0533 03/21/17 0552 03/22/17 0555  Weight: 106.9 kg (235 lb 9.6 oz) 105.7 kg (233 lb 1.6 oz) 106.7 kg (235 lb 3.2 oz)    History of present illness:  [name] [name] a 83 y.o.femalewith medical history significant of Pulmonary htn, sick sinus syndrome s/p pacer, atrial fibrillation, hypertension, CKD 3,  cognitive impairment. Patient presents with dyspnea that has worsened over the last three days. Dyspnea is exertional without associated chest pain or diaphoresis. Improved with resting. She has not done anything else to improve symptoms. Daughter reports a 25 lb weight gain over three months. No wheezing or coughing. No fevers. Patient was recently here for runs of V. tach secondary to pacemaker malfunction. This was adjusted by cardiology at the time. Patient had some dyspnea that time as well after discharge  Pt has CT scan which reported suspected amiodarone toxicity and pulmonary nodule   Hospital Course:  Dyspnea - Prior progress note reviewed - At this point dyspnea could be secondary to worsening lung function secondary to amiodarone toxicity. I have discussed with pulmonary team the CT scan findings and the recommendations are to discontinue amiodarone. Place on prednisone 40 mg for the next few days and taper over the next 2 weeks. Prescription available on discharge - Patient is on Lasix and I think at this point she is euvolemic.  Paroxysmal atrial fibrillation CHA2DS2-VASc Scoreis 5. Paced rhythm. -Hold amiodaroneand monitor overnight to see how heart rate does. -Continue metoprolol -Continue warfarin on discharge  Pulmonary nodule - Discussed with pulmonologist who recommends repeating CT scan in 3-6 months.  Sick sinus syndrome S/p pacer - EP specialist evaluated  Chronic diastolic heart failure Last EF of 66 5% with grade 2 diastolic dysfunction seen on EF from 03/15/2017.  -continue home Lasix regimen seems euvolemic  Diabetes mellitus -Continue Sliding-scale insulin -On d/c metformin given serum creatinine elevated above 1.4  Acute kidney injury on CKD  stage III Slightly above baseline of about 1.8 to 2.0 -Continue to monitor serum creatinine. Near baseline  Anemia Normocytic. FOBT negative. Baseline of around 10. Stable from three days  ago -anemia panel shows low FE was replaced IV according to notes -CBC daily  Cognitive impairment Suspecting dementia. She has been seen as an outpatient by neurology and was prescribed medication, for which she has discontinued using. She does not remember being in hospital a few days prior -Most likely needSNF vs 24 hour supervision  Hypokalemia   05/20/2017 1st [facility] office visit/ [name] [name]   Chief Complaint  Patient presents with  . Pulmonary Consult    Referred by  [name]. Pt c/o SOB since Jan 2018. She states that she sometimes feels SOB just walking around her home. She has been retaining some fluid in her legs. She was recently taken off of Amiodarone in June 2018.   off pred x one month prior to OV  Seemed to help ? Only a little worse since finished  Doe = MMRC3 = can't walk 100 yards even at a slow pace at a flat grade s stopping due to sob  eg wob room to room No problem at rest or noct on 2 pillows / still has significant leg swelling and CRI limits use of more diuretics    No obvious day to day or daytime variability or assoc excess/ purulent sputum or mucus plugs or hemoptysis or cp or chest tightness, subjective wheeze or overt sinus or hb symptoms. No unusual exp hx or h/o childhood pna/ asthma or knowledge of premature birth.  Sleeping ok without nocturnal  or early am exacerbation  of respiratory  c/o's or need for noct saba. Also denies any obvious fluctuation of symptoms with weather or environmental changes or other aggravating or alleviating factors except as outlined above   Current Medications, Allergies, Complete Past Medical History, Past Surgical History, Family History, and Social History were reviewed in [employer] record.  ROS  The following are not active complaints unless bolded sore throat, dysphagia, dental problems, itching, sneezing,  nasal congestion or excess/ purulent secretions, ear ache,   fever, chills,  sweats, unintended wt loss, classically pleuritic or exertional cp,  orthopnea pnd or leg swelling, presyncope, palpitations, abdominal pain, anorexia, nausea, vomiting, diarrhea  or change in bowel or bladder habits, change in stools or urine, dysuria,hematuria,  rash, arthralgias, visual complaints, headache, numbness, weakness or ataxia or problems with walking or coordination,  change in mood/affect or memory.         Review of Systems     Objective:   Physical Exam  amb [place] female nad  Wt Readings from Last 3 Encounters:  05/20/17 250 lb (113.4 kg)  05/19/17 251 lb 9.6 oz (114.1 kg)  04/04/17 242 lb (109.8 kg)    Vital signs reviewed  - Note on arrival 02 sats  98% on RA      HEENT: nl   turbinates bilaterally, and oropharynx. Nl external ear canals without cough reflex - edentulous   NECK :  without JVD/Nodes/TM/ nl carotid upstrokes bilaterally   LUNGS: no acc muscle use,  Nl contour chest  With trace insp crackles both bases   CV:  RRR  no s3 or murmur or increase in P2, and  1-2+ ptting both legs   ABD:  soft and nontender with nl inspiratory excursion in the supine position. No bruits or organomegaly appreciated, bowel sounds nl  MS:  Nl  gait/ ext warm without deformities, calf tenderness, cyanosis or clubbing No obvious joint restrictions   SKIN: warm and dry without lesions    NEURO:  alert, approp, nl sensorium with  no motor or cerebellar deficits apparent.      CXR PA and Lateral:   05/20/2017 :    I personally reviewed images and my impression as follows:    mod CM/ mild ILD/ large HH    Labs ordered/ reviewed:      Chemistry      Component Value Date/Time   NA 143 05/19/2017 1601   NA 139 03/22/2013 1015   K 4.5 05/19/2017 1601   K 3.7 03/22/2013 1015   CL 101 05/19/2017 1601   CL 101 03/22/2013 1015   CO2 25 05/19/2017 1601   CO2 26 03/22/2013 1015   BUN 24 05/19/2017 1601   BUN 23.2 03/22/2013 1015   CREATININE 1.85 (H) 05/19/2017 1601    CREATININE 1.60 (H) 04/23/2016 1657   CREATININE 1.3 (H) 03/22/2013 1015      Component Value Date/Time   CALCIUM 9.6 05/19/2017 1601   CALCIUM 9.8 03/22/2013 1015   ALKPHOS 105 05/19/2017 1601   ALKPHOS 102 03/22/2013 1015   AST 18 05/19/2017 1601   AST 16 03/22/2013 1015   ALT 9 05/19/2017 1601   ALT 12 03/22/2013 1015   BILITOT 0.4 05/19/2017 1601   BILITOT 0.60 03/22/2013 1015        Lab Results  Component Value Date   WBC 4.8 05/20/2017   HGB 10.3 (L) 05/20/2017   HCT 32.4 (L) 05/20/2017   MCV 84.7 05/20/2017   PLT 324.0 05/20/2017         Lab Results  Component Value Date   TSH 1.94 05/20/2017     Lab Results  Component Value Date   PROBNP 387.0 (H) 05/20/2017       Lab Results  Component Value Date   ESRSEDRATE 93 (H) 05/20/2017              Assessment:

## 2017-05-21 ENCOUNTER — Telehealth: Payer: Self-pay | Admitting: Internal Medicine

## 2017-05-21 DIAGNOSIS — D631 Anemia in chronic kidney disease: Secondary | ICD-10-CM | POA: Diagnosis not present

## 2017-05-21 DIAGNOSIS — N184 Chronic kidney disease, stage 4 (severe): Secondary | ICD-10-CM | POA: Diagnosis not present

## 2017-05-21 DIAGNOSIS — I495 Sick sinus syndrome: Secondary | ICD-10-CM | POA: Diagnosis not present

## 2017-05-21 DIAGNOSIS — E1122 Type 2 diabetes mellitus with diabetic chronic kidney disease: Secondary | ICD-10-CM | POA: Diagnosis not present

## 2017-05-21 DIAGNOSIS — I130 Hypertensive heart and chronic kidney disease with heart failure and stage 1 through stage 4 chronic kidney disease, or unspecified chronic kidney disease: Secondary | ICD-10-CM | POA: Diagnosis not present

## 2017-05-21 DIAGNOSIS — I2510 Atherosclerotic heart disease of native coronary artery without angina pectoris: Secondary | ICD-10-CM | POA: Diagnosis not present

## 2017-05-21 DIAGNOSIS — R911 Solitary pulmonary nodule: Secondary | ICD-10-CM | POA: Diagnosis not present

## 2017-05-21 DIAGNOSIS — I5032 Chronic diastolic (congestive) heart failure: Secondary | ICD-10-CM | POA: Diagnosis not present

## 2017-05-21 DIAGNOSIS — I272 Pulmonary hypertension, unspecified: Secondary | ICD-10-CM | POA: Diagnosis not present

## 2017-05-21 DIAGNOSIS — I480 Paroxysmal atrial fibrillation: Secondary | ICD-10-CM | POA: Diagnosis not present

## 2017-05-21 NOTE — Assessment & Plan Note (Signed)
03/20/17 HRCT :  irregular 6 mm solid left upper lobe pulmonary nodule, new since 04/12/2016 chest CT > placed in reminder file for repeat 09/19/17   CT results reviewed with pt >>> Too small for PET or bx, not suspicious enough for excisional bx and very poor candidate for aggressive rx in any case > really only option for now is follow the Fleischner society guidelines as rec by radiology.   Discussed in detail all the  indications, usual  risks and alternatives  relative to the benefits with patient who agrees to proceed with conservative f/u as outlined

## 2017-05-21 NOTE — Progress Notes (Signed)
Spoke with pt and notified of results per Dr. Wert. Pt verbalized understanding and denied any questions. 

## 2017-05-21 NOTE — Assessment & Plan Note (Signed)
-   Echo  2010, moderate TR, mild right ventricular enlargement 53 mmHg - Echo 03/15/17 Left ventricle: The cavity size was normal. Wall thickness was   increased in a pattern of mild LVH. Systolic function was normal.   The estimated ejection fraction was in the range of 60% to 65%.   Wall motion was normal; there were no regional wall motion   abnormalities. Features are consistent with a pseudonormal left   ventricular filling pattern, with concomitant abnormal relaxation   and increased filling pressure (grade 2 diastolic dysfunction).   Doppler parameters are consistent with high ventricular filling   pressure. - Aortic valve: There was mild regurgitation. Valve area (VTI):   2.22 cm^2. Valve area (Vmax): 2.44 cm^2. Valve area (Vmean): 2.14   cm^2. - Mitral valve: Mildly calcified annulus. Mildly thickened leaflets   . There was mild regurgitation. Valve area by continuity equation   (using LVOT flow): 2.06 cm^2. - Left atrium: The atrium was moderately to severely dilated. - Right ventricle: The cavity size was mildly dilated. - Right atrium: The atrium was moderately dilated. - Atrial septum: No defect or patent foramen ovale was identified. - Tricuspid valve: There was moderate-severe regurgitation. - Pulmonary arteries: Systolic pressure was moderately increased.   PA peak pressure: 61 mm Hg (S).  Probably multifactorial but mostly on basis of elevated L ht pressures and not PAH or TEPAH > rx is control vol status to the extent possible given degree of CRI and continue anticoagulation.    RHC optional here given overall geriatric decline.

## 2017-05-21 NOTE — Assessment & Plan Note (Addendum)
D/c amiodarone 03/18/2017 - ESR  05/20/2017   = 93 off pred x ? 4 wks - 05/20/2017   Walked RA x one lap @ 185 stopped due to  [employer] /slow pace/ no desats   Clearly improved vs admit with likely component of amiodarone toxicity and still very impressive ESR but since don't have baseline before prednisone it may well be on its way down and there is definitely potential for pred toxicity also so will observe off the amio and pred for any further decline in activity tol not directly linked to vol status and consider restart a more moderate dose using the philosophy that the lowest effective dose is the most appropriate  eg 20 mg daily    Total time devoted to counseling  > 50 % of initial 60 min office visit:  review case including extensive inpt records with pt/daughter discussion of options/alternatives/ personally creating written customized instructions  in presence of pt  then going over those specific  Instructions directly with the pt including how to use all of the meds but in particular covering each new medication in detail and the difference between the maintenance= "automatic" meds and the prns using an action plan format for the latter (If this problem/symptom => do that organization reading Left to right).  Please see AVS from this visit for a full list of these instructions which I personally wrote for this pt and  are unique to this visit.

## 2017-05-21 NOTE — Telephone Encounter (Signed)
Wanted to follow up on blood sugar being high. Care giver has taken patients blood sugar every day for the past week: 196 171 175 275 177 States are much better than last week.  Does not need call back.  Unless there are instructions.

## 2017-05-22 ENCOUNTER — Telehealth: Payer: Self-pay | Admitting: *Deleted

## 2017-05-22 NOTE — Telephone Encounter (Signed)
-----   Message from Ward Givens, NP sent at 05/21/2017  3:25 PM EDT ----- Kidney function has improved slightly from 1 month ago. If they are willing Namenda 5 mg daily can be started initially and we can slowly increase based off tolerability.

## 2017-05-22 NOTE — Telephone Encounter (Signed)
Ok thank you for letting me know. Maintain current medications

## 2017-05-22 NOTE — Telephone Encounter (Signed)
Spoke to pt then called [name], daughter about lab results.  Relayed that kidney function results are improved slightly form last month, and if willing can start memantine 5mg  po daily and slowly increase depending on how she tolerates.  Daughter stated that pt had seen Dr. Melvyn Novas and he had done lab work and they were waiting for results.  I told her that his office had called and spoke to pt.    She will call there office and ask about results and his recommendation (relating to taking memantine).  If ok will start the memantine 5mg  po daily and then et [place] know how she tolerates.

## 2017-05-23 ENCOUNTER — Encounter: Payer: Self-pay | Admitting: Pharmacist

## 2017-05-28 ENCOUNTER — Telehealth: Payer: Self-pay | Admitting: Neurology

## 2017-05-28 DIAGNOSIS — R911 Solitary pulmonary nodule: Secondary | ICD-10-CM | POA: Diagnosis not present

## 2017-05-28 DIAGNOSIS — I272 Pulmonary hypertension, unspecified: Secondary | ICD-10-CM | POA: Diagnosis not present

## 2017-05-28 DIAGNOSIS — E1122 Type 2 diabetes mellitus with diabetic chronic kidney disease: Secondary | ICD-10-CM | POA: Diagnosis not present

## 2017-05-28 DIAGNOSIS — I2510 Atherosclerotic heart disease of native coronary artery without angina pectoris: Secondary | ICD-10-CM | POA: Diagnosis not present

## 2017-05-28 DIAGNOSIS — D631 Anemia in chronic kidney disease: Secondary | ICD-10-CM | POA: Diagnosis not present

## 2017-05-28 DIAGNOSIS — N184 Chronic kidney disease, stage 4 (severe): Secondary | ICD-10-CM | POA: Diagnosis not present

## 2017-05-28 DIAGNOSIS — I495 Sick sinus syndrome: Secondary | ICD-10-CM | POA: Diagnosis not present

## 2017-05-28 DIAGNOSIS — I480 Paroxysmal atrial fibrillation: Secondary | ICD-10-CM | POA: Diagnosis not present

## 2017-05-28 DIAGNOSIS — I130 Hypertensive heart and chronic kidney disease with heart failure and stage 1 through stage 4 chronic kidney disease, or unspecified chronic kidney disease: Secondary | ICD-10-CM | POA: Diagnosis not present

## 2017-05-28 DIAGNOSIS — I5032 Chronic diastolic (congestive) heart failure: Secondary | ICD-10-CM | POA: Diagnosis not present

## 2017-05-28 NOTE — Telephone Encounter (Signed)
Tracy/[facility] [phone] called the clinic she said Dr Jannifer Franklin prescribed memantine but the pt has not started taking it yet. Pt has seen Dr Wert/Pulmonologist and he said kidney function was "a little off" and he drew labs. Family is concerned about the kidney function and is wanting to know if it will be ok for the pt start the medication. Labs are in epic. Thank you

## 2017-05-28 NOTE — Telephone Encounter (Signed)
Please let patient know I will discuss with Dr. Jannifer Franklin- if he has no objections we will start Namenda

## 2017-05-28 NOTE — Telephone Encounter (Signed)
Called daughter, Zenda Alpers. Relayed message from MM,NP. Advised we will call back next week to advise how to proceed. She verbalized understanding.

## 2017-05-29 MED ORDER — MEMANTINE HCL 5 MG PO TABS: 5.0000 mg | ORAL_TABLET | Freq: Two times a day (BID) | ORAL | 5 refills | Status: DC

## 2017-05-29 NOTE — Telephone Encounter (Signed)
With a GFR of less than 29, a dose adjustment is recommended for Namenda, maximum of 10 mg daily.

## 2017-05-29 NOTE — Telephone Encounter (Addendum)
Spoke with patient and advised her that per Dr Jannifer Franklin her namenda dose has been reduced. Advised her the NP [facility] [address] for her to pick up. Called dgtr, [name], on dpr and [place] her of same.

## 2017-05-29 NOTE — Addendum Note (Signed)
Addended by: Trudie Buckler on: 05/29/2017 04:18 PM   Modules accepted: Orders

## 2017-05-29 NOTE — Telephone Encounter (Signed)
Namenda 5 mg twice a day was ordered for the patient. Verneita Griffes please call and informed the patient.

## 2017-06-04 ENCOUNTER — Telehealth: Payer: Self-pay | Admitting: Adult Health

## 2017-06-04 ENCOUNTER — Ambulatory Visit (INDEPENDENT_AMBULATORY_CARE_PROVIDER_SITE_OTHER): Payer: Medicare HMO | Admitting: Pharmacist

## 2017-06-04 ENCOUNTER — Telehealth: Payer: Self-pay | Admitting: Internal Medicine

## 2017-06-04 ENCOUNTER — Ambulatory Visit: Payer: Medicare HMO | Admitting: Physician Assistant

## 2017-06-04 ENCOUNTER — Encounter: Payer: Self-pay | Admitting: Internal Medicine

## 2017-06-04 ENCOUNTER — Ambulatory Visit (INDEPENDENT_AMBULATORY_CARE_PROVIDER_SITE_OTHER): Payer: Medicare HMO | Admitting: Internal Medicine

## 2017-06-04 VITALS — BP 122/56 | HR 60 | Ht 72.0 in | Wt 251.0 lb

## 2017-06-04 DIAGNOSIS — I130 Hypertensive heart and chronic kidney disease with heart failure and stage 1 through stage 4 chronic kidney disease, or unspecified chronic kidney disease: Secondary | ICD-10-CM | POA: Diagnosis not present

## 2017-06-04 DIAGNOSIS — I6529 Occlusion and stenosis of unspecified carotid artery: Secondary | ICD-10-CM | POA: Diagnosis not present

## 2017-06-04 DIAGNOSIS — Z86711 Personal history of pulmonary embolism: Secondary | ICD-10-CM | POA: Diagnosis not present

## 2017-06-04 DIAGNOSIS — I2510 Atherosclerotic heart disease of native coronary artery without angina pectoris: Secondary | ICD-10-CM | POA: Diagnosis not present

## 2017-06-04 DIAGNOSIS — D631 Anemia in chronic kidney disease: Secondary | ICD-10-CM | POA: Diagnosis not present

## 2017-06-04 DIAGNOSIS — Z950 Presence of cardiac pacemaker: Secondary | ICD-10-CM | POA: Diagnosis not present

## 2017-06-04 DIAGNOSIS — Z7984 Long term (current) use of oral hypoglycemic drugs: Secondary | ICD-10-CM | POA: Diagnosis not present

## 2017-06-04 DIAGNOSIS — I10 Essential (primary) hypertension: Secondary | ICD-10-CM

## 2017-06-04 DIAGNOSIS — I480 Paroxysmal atrial fibrillation: Secondary | ICD-10-CM

## 2017-06-04 DIAGNOSIS — R911 Solitary pulmonary nodule: Secondary | ICD-10-CM | POA: Diagnosis not present

## 2017-06-04 DIAGNOSIS — R001 Bradycardia, unspecified: Secondary | ICD-10-CM | POA: Diagnosis not present

## 2017-06-04 DIAGNOSIS — I5032 Chronic diastolic (congestive) heart failure: Secondary | ICD-10-CM | POA: Diagnosis not present

## 2017-06-04 DIAGNOSIS — I495 Sick sinus syndrome: Secondary | ICD-10-CM | POA: Diagnosis not present

## 2017-06-04 DIAGNOSIS — E1122 Type 2 diabetes mellitus with diabetic chronic kidney disease: Secondary | ICD-10-CM | POA: Diagnosis not present

## 2017-06-04 DIAGNOSIS — M549 Dorsalgia, unspecified: Secondary | ICD-10-CM | POA: Diagnosis not present

## 2017-06-04 DIAGNOSIS — Z853 Personal history of malignant neoplasm of breast: Secondary | ICD-10-CM | POA: Diagnosis not present

## 2017-06-04 DIAGNOSIS — Z7901 Long term (current) use of anticoagulants: Secondary | ICD-10-CM | POA: Diagnosis not present

## 2017-06-04 DIAGNOSIS — Z87891 Personal history of nicotine dependence: Secondary | ICD-10-CM | POA: Diagnosis not present

## 2017-06-04 DIAGNOSIS — E1140 Type 2 diabetes mellitus with diabetic neuropathy, unspecified: Secondary | ICD-10-CM | POA: Diagnosis not present

## 2017-06-04 DIAGNOSIS — I272 Pulmonary hypertension, unspecified: Secondary | ICD-10-CM | POA: Diagnosis not present

## 2017-06-04 DIAGNOSIS — N184 Chronic kidney disease, stage 4 (severe): Secondary | ICD-10-CM | POA: Diagnosis not present

## 2017-06-04 DIAGNOSIS — E1151 Type 2 diabetes mellitus with diabetic peripheral angiopathy without gangrene: Secondary | ICD-10-CM | POA: Diagnosis not present

## 2017-06-04 LAB — POCT INR: INR: 2.3

## 2017-06-04 NOTE — Telephone Encounter (Signed)
Olivia Mackie with [facility] is calling to get dosage for memantine (NAMENDA) 5 MG tablet.

## 2017-06-04 NOTE — Telephone Encounter (Signed)
New message    Pt is having elevated heart rate was 128 they got it settled down to 86 (at rest) took a while to get her heart rate to settle, all vitals are clear , bp 112/82 , lungs are clear

## 2017-06-04 NOTE — Progress Notes (Signed)
PCP: Hoyt Koch, MD   Primary EP:  Dr Rayann Heman  [name] [name] is a 83 y.o. female who presents today for routine electrophysiology followup.  Since last being seen in our clinic, the patient reports doing reasonably well.  She continues to have slow decline.  Her breathing has improved off of amiodarone.  Today, she denies symptoms of palpitations, chest pain, lower extremity edema, dizziness, presyncope, or syncope.  The patient is otherwise without complaint today.   Past Medical History:  Diagnosis Date  . Acute gastric ulcer without mention of hemorrhage, perforation, or obstruction 2006   EGD  . Anxiety   . Atrial fibrillation (HCC)    flecainide; coumadin  . Breast cancer ([place])    left  . Carotid artery disease ([place])    53/74: RICA 8-27%, LICA 07-86%  //  [place] 75/44: RICA 9-20%; LICA 10-07% >> FU 1 year   . Chronic diastolic heart failure (HCC)    a. echo 10/10: mild LVH, EF 55-60%, mild AI, mild MR, mild to mod LAE, mild RVE, severe RAE, mod TR, PASP 53  /  b.  Echo 3/14:  Mild LVH, EF 55%, Tr AI, MAC, mild MR, mod LAE, PASP 31, trivial eff. // c. echo 1/17: EF 55-60%, mild AI, MAC, mild MR, moderate BAE, moderate TR  . Chronic lower back pain   . Diabetic peripheral neuropathy ([place]) 03/09/2015  . Diverticulosis   . Esophagitis, unspecified 2006   EGD  . Hiatal hernia 2006   EGD  . History of nuclear stress test    a. Myoview 2/12: EF 69%, no scar or ischemia  . Hx of adenomatous colonic polyps   . Hypertension   . Mediastinal lymphadenopathy   . Memory difficulty 11/14/2016  . Mitral regurgitation    mild, echo, October, 2010  . Osteopenia   . Presence of permanent cardiac pacemaker   . Pulmonary embolus [facility]) 2006   2006, with DVT  . Pulmonary HTN ([place])    53 mmHg echo, 2010, moderate TR, mild right ventricular enlargement  . Tachy-brady syndrome ([place])    s/p pacer 07/2009  . Thyroid nodule    non-neoplastic goiter  . Type II diabetes  mellitus ([place])   . Venous insufficiency    Chronic   Past Surgical History:  Procedure Laterality Date  . ABDOMINAL HYSTERECTOMY    . ANKLE FRACTURE SURGERY Right   . BACK SURGERY    . BREAST LUMPECTOMY Left   . FRACTURE SURGERY    . INSERT / REPLACE / REMOVE PACEMAKER  06/30/2009   MDT Adalpta L implanted by Dr Olevia Perches  . LUMBAR LAMINECTOMY  1973, 06/22/2011   "no hardware either time"    ROS- all systems are reviewed and negative except as per HPI above  Current Outpatient Prescriptions  Medication Sig Dispense Refill  . Acetaminophen (TYLENOL 8 HOUR PO) Take by mouth.    . ALPRAZolam (XANAX) 0.25 MG tablet TAKE 1 TABLET BY MOUTH TWICE A DAY AS NEEDED FOR ANXIETY *CAN FILL 01-28-17 20 tablet 1  . blood glucose meter kit and supplies KIT Dispense based on patient and insurance preference. Use before breakfast and at bedtime. Diagnosis code: E11.9 1 each 2  . furosemide (LASIX) 80 MG tablet TAKE 80 MG IN AM AND 40 MG IN PM 90 tablet 1  . gabapentin (NEURONTIN) 300 MG capsule Take 600 mg by mouth 2 (two) times daily.     . memantine (NAMENDA) 5 MG tablet  Take 1 tablet (5 mg total) by mouth 2 (two) times daily. (Patient taking differently: Take 5 mg by mouth daily. ) 60 tablet 5  . metoprolol succinate (TOPROL-XL) 25 MG 24 hr tablet Take 1 tablet (25 mg total) by mouth daily. 30 tablet 3  . potassium chloride 20 MEQ/15ML (10%) SOLN Take 20 mEq by mouth daily as needed (if have not eaten a banana).    . promethazine (PHENERGAN) 25 MG tablet Take 1 tablet (25 mg total) by mouth every 8 (eight) hours as needed for nausea or vomiting. 20 tablet 0  . pyridoxine (B-6) 200 MG tablet Take 200 mg by mouth daily.    . vitamin B-12 (CYANOCOBALAMIN) 500 MCG tablet Take 500 mcg by mouth daily.     Marland Kitchen warfarin (COUMADIN) 2.5 MG tablet Take 1 tablet (2.5 mg total) by mouth daily. (Patient taking differently: Take 2.5 mg by mouth daily. 1.56m on Monday.) 30 tablet 3   No current facility-administered  medications for this visit.     Physical Exam: Vitals:   06/04/17 1455  BP: (!) 122/56  Pulse: 60  SpO2: 96%  Weight: 251 lb (113.9 kg)  Height: 6' (1.829 m)    GEN- The patient is elderly appearing, alert and oriented x 3 today.   Head- normocephalic, atraumatic Eyes-  Sclera clear, conjunctiva pink Ears- hearing intact Oropharynx- clear Lungs- Clear to ausculation bilaterally, normal work of breathing Chest- pacemaker pocket is well healed Heart- Regular rate and rhythm, no murmurs, rubs or gallops, PMI not laterally displaced GI- soft, NT, ND, + BS Extremities- no clubbing, cyanosis, or edema  Pacemaker interrogation- reviewed in detail today,  See PACEART report   Assessment and Plan:  1. Symptomatic sinus bradycardia  Normal pacemaker function See PClaudia DesanctisArt report She had been programmed DDI since my last visit and has gone from 0% V pacing to 100% V pacing.  I have therefore turned MVP back on today.  She will return to have this reassessed in 3 months by EP NP.  2. Persistent afib Currently in sinus rhythm On coumadin for chads2vasc score of 5  3. HTN Stable No change required today  [facility] Return to see EP NP in 358month JaThompson GrayerD, [facility]/01/2017 3:11 PM

## 2017-06-04 NOTE — Telephone Encounter (Signed)
Spoke with Olivia Mackie, [facility] RN and informed her that directions for Namenda state take twice daily, however Dr Jannifer Franklin reduced to take 5 mg once daily. This was due to patient's kidney function. Olivia Mackie verbalized understanding, appreciation.

## 2017-06-04 NOTE — Patient Instructions (Signed)
Medication Instructions:  Your physician recommends that you continue on your current medications as directed. Please refer to the Current Medication list given to you today.   Labwork: None ordered   Testing/Procedures: None ordered   Follow-Up: Your physician recommends that you schedule a follow-up appointment in: 3 months with Amber Seiler, NP   Any Other Special Instructions Will Be Listed Below (If Applicable).     If you need a refill on your cardiac medications before your next appointment, please call your pharmacy.   

## 2017-06-10 IMAGING — CR DG CHEST 2V
2 series · 2 of 2 positions shown · non-contrast
Comparison: 04/04/2016

CLINICAL DATA: New shortness of breath this morning, history of
CHF, hypertension, diabetes mellitus, atrial fibrillation, pulmonary
hypertension, prior pulmonary embolism, former smoker

EXAM:
CHEST  2 VIEW

[w chest pa]
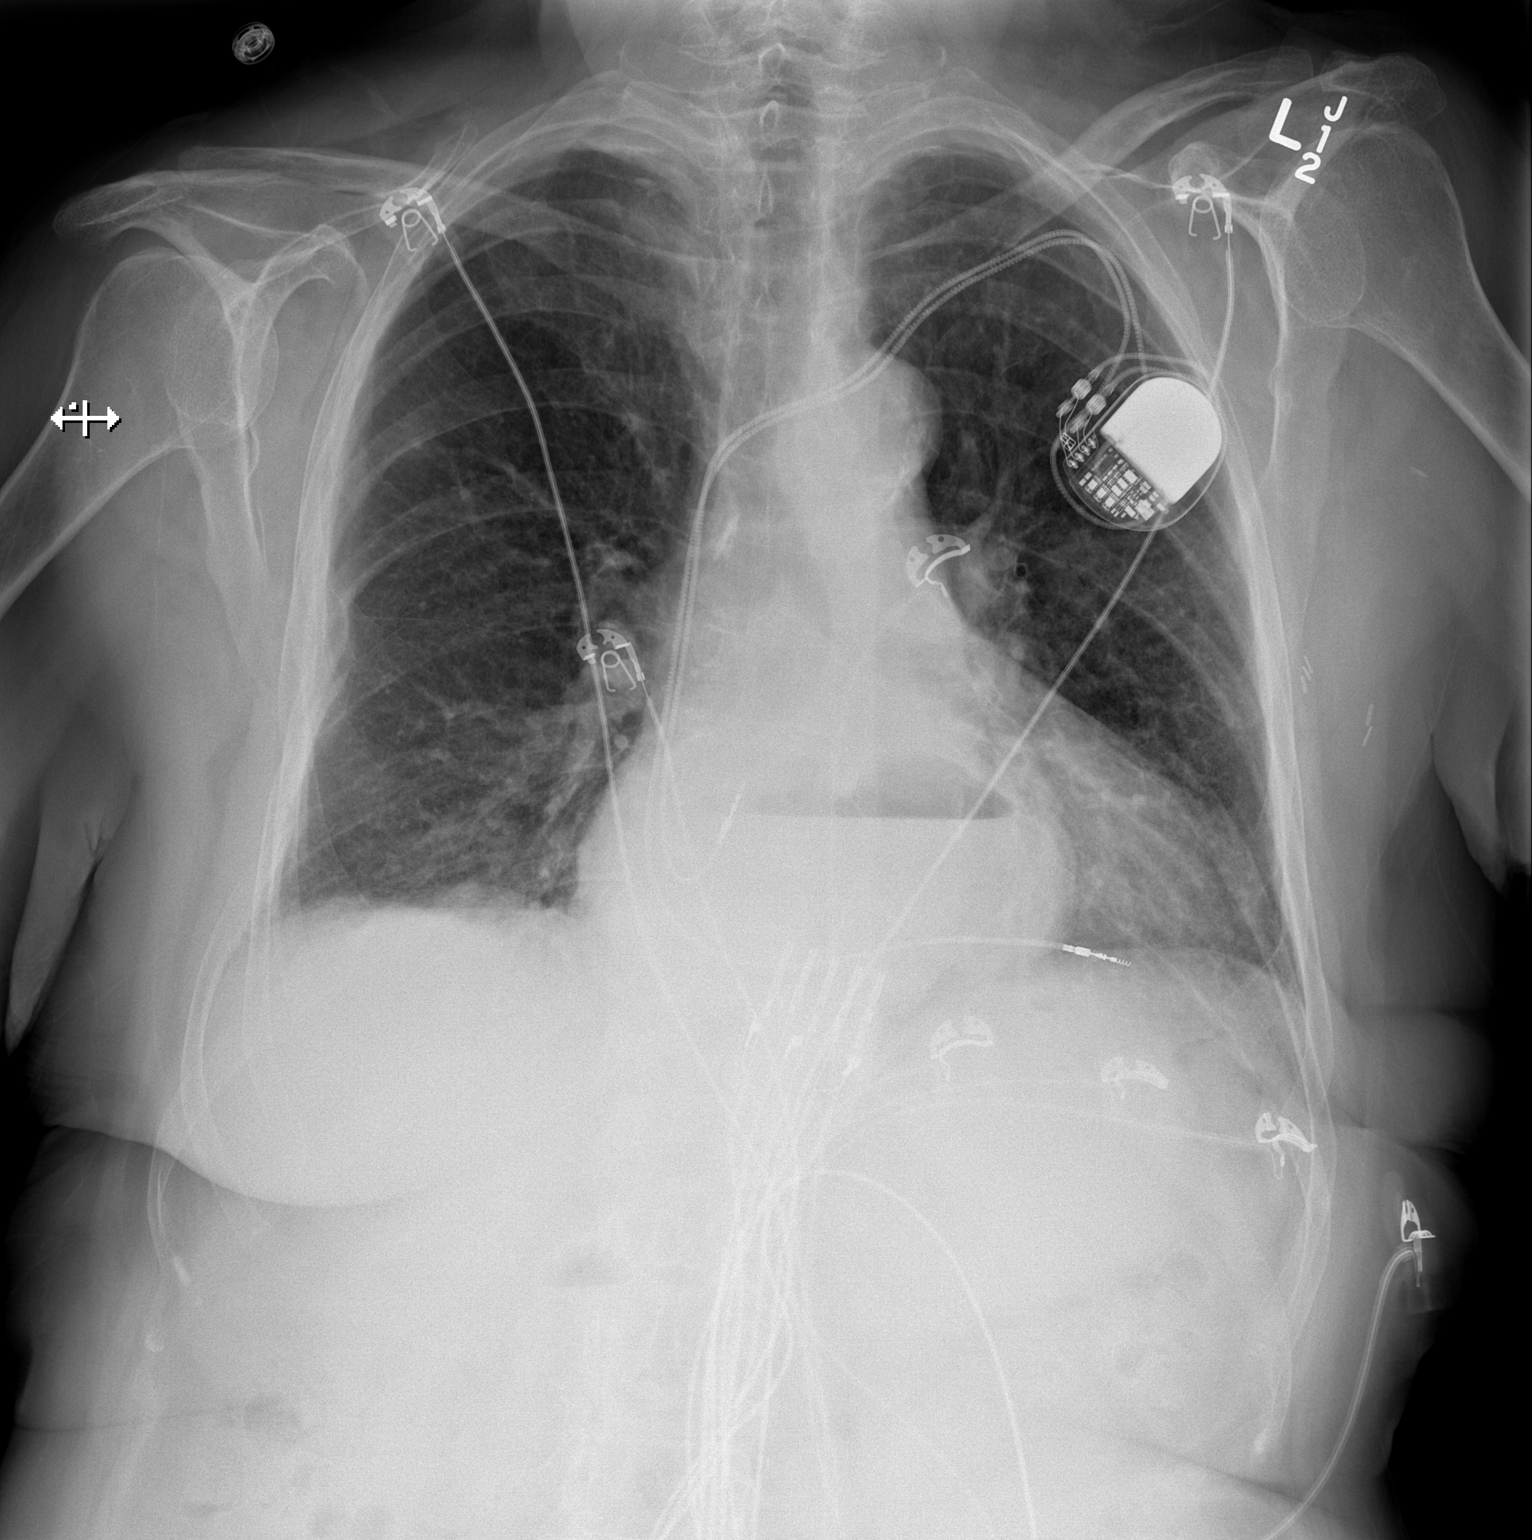

[w chest lat]
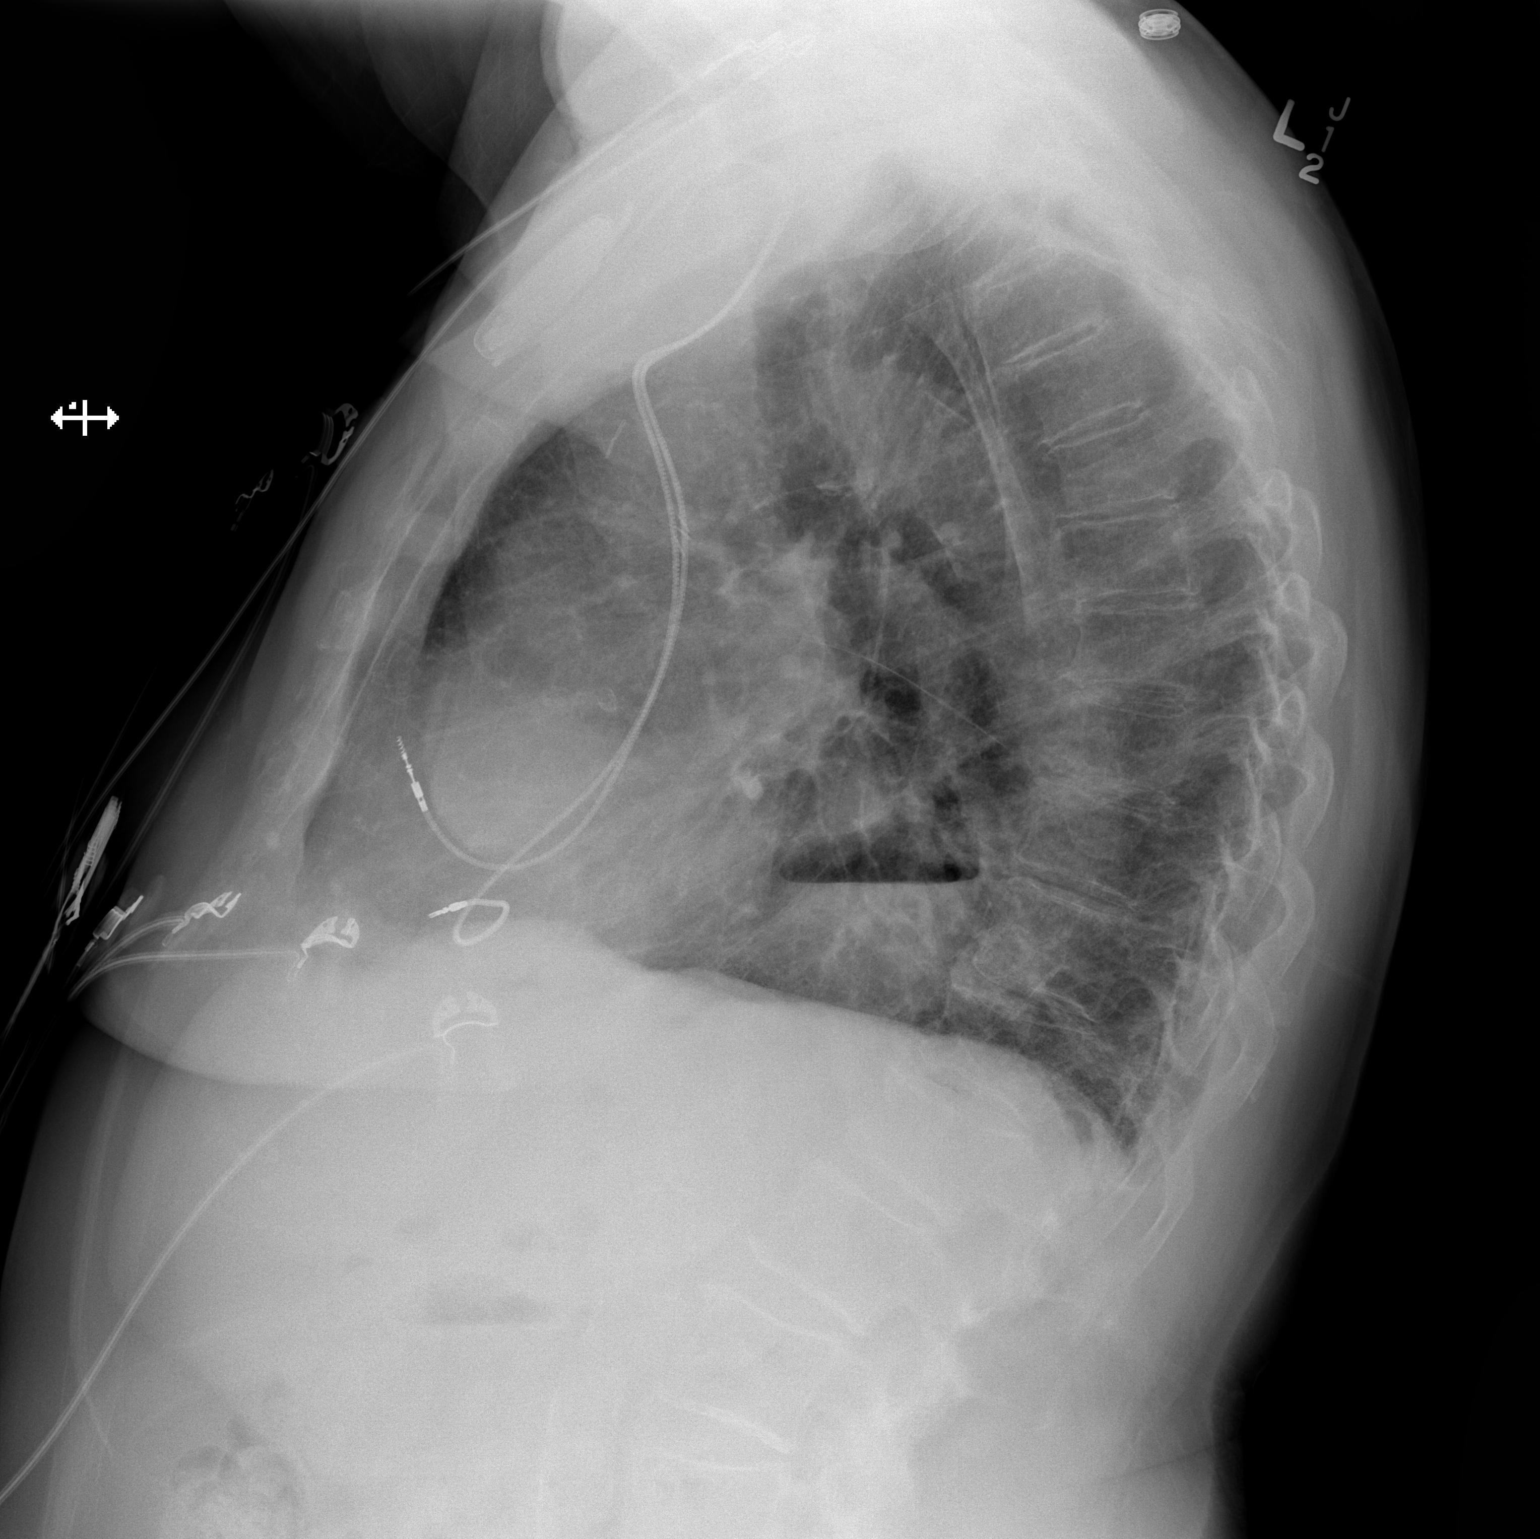

[2 of 2 positions shown; findings below may reference images not displayed]

FINDINGS: Left subclavian sequential transvenous pacemaker leads project at
right atrium and right ventricle.

Enlargement of cardiac silhouette with pulmonary vascular
congestion.

Aortic atherosclerosis.

Large hiatal hernia with air-fluid level.

RIGHT pleural effusion and basilar atelectasis.

Central peribronchial thickening.

No definite infiltrate or pneumothorax.

Bones demineralized.
IMPRESSION: Enlargement of cardiac silhouette with pulmonary vascular congestion
post pacemaker.

Bronchitic changes with RIGHT basilar atelectasis and small RIGHT
pleural effusion.

Aortic atherosclerosis.

## 2017-06-12 LAB — CUP PACEART INCLINIC DEVICE CHECK
Brady Statistic AP VP Percent: 91 %
Brady Statistic AP VS Percent: 0 %
Brady Statistic AS VP Percent: 0 %
Brady Statistic AS VS Percent: 9 %
Date Time Interrogation Session: 20180905184915
Implantable Lead Location: 753860
Implantable Lead Model: 5076
Implantable Pulse Generator Implant Date: 20101001
Lead Channel Pacing Threshold Amplitude: 0.75 V
Lead Channel Pacing Threshold Amplitude: 2.75 V
Lead Channel Sensing Intrinsic Amplitude: 8 mV
Lead Channel Setting Pacing Pulse Width: 1 ms
Lead Channel Setting Sensing Sensitivity: 4 mV
MDC IDC LEAD IMPLANT DT: 20101001
MDC IDC LEAD IMPLANT DT: 20101001
MDC IDC LEAD LOCATION: 753859
MDC IDC MSMT BATTERY IMPEDANCE: 1121 Ohm
MDC IDC MSMT BATTERY REMAINING LONGEVITY: 40 mo
MDC IDC MSMT BATTERY VOLTAGE: 2.77 V
MDC IDC MSMT LEADCHNL RA IMPEDANCE VALUE: 389 Ohm
MDC IDC MSMT LEADCHNL RA PACING THRESHOLD PULSEWIDTH: 0.4 ms
MDC IDC MSMT LEADCHNL RA SENSING INTR AMPL: 1 mV
MDC IDC MSMT LEADCHNL RV IMPEDANCE VALUE: 407 Ohm
MDC IDC MSMT LEADCHNL RV PACING THRESHOLD PULSEWIDTH: 1 ms
MDC IDC SET LEADCHNL RA PACING AMPLITUDE: 2 V
MDC IDC SET LEADCHNL RV PACING AMPLITUDE: 2.5 V

## 2017-06-17 DIAGNOSIS — I495 Sick sinus syndrome: Secondary | ICD-10-CM | POA: Diagnosis not present

## 2017-06-17 DIAGNOSIS — I2510 Atherosclerotic heart disease of native coronary artery without angina pectoris: Secondary | ICD-10-CM | POA: Diagnosis not present

## 2017-06-17 DIAGNOSIS — I130 Hypertensive heart and chronic kidney disease with heart failure and stage 1 through stage 4 chronic kidney disease, or unspecified chronic kidney disease: Secondary | ICD-10-CM | POA: Diagnosis not present

## 2017-06-17 DIAGNOSIS — R911 Solitary pulmonary nodule: Secondary | ICD-10-CM | POA: Diagnosis not present

## 2017-06-17 DIAGNOSIS — E1122 Type 2 diabetes mellitus with diabetic chronic kidney disease: Secondary | ICD-10-CM | POA: Diagnosis not present

## 2017-06-17 DIAGNOSIS — D631 Anemia in chronic kidney disease: Secondary | ICD-10-CM | POA: Diagnosis not present

## 2017-06-17 DIAGNOSIS — E1151 Type 2 diabetes mellitus with diabetic peripheral angiopathy without gangrene: Secondary | ICD-10-CM | POA: Diagnosis not present

## 2017-06-17 DIAGNOSIS — I480 Paroxysmal atrial fibrillation: Secondary | ICD-10-CM | POA: Diagnosis not present

## 2017-06-17 DIAGNOSIS — N184 Chronic kidney disease, stage 4 (severe): Secondary | ICD-10-CM | POA: Diagnosis not present

## 2017-06-17 DIAGNOSIS — E1140 Type 2 diabetes mellitus with diabetic neuropathy, unspecified: Secondary | ICD-10-CM | POA: Diagnosis not present

## 2017-06-17 DIAGNOSIS — I272 Pulmonary hypertension, unspecified: Secondary | ICD-10-CM | POA: Diagnosis not present

## 2017-06-17 DIAGNOSIS — I5032 Chronic diastolic (congestive) heart failure: Secondary | ICD-10-CM | POA: Diagnosis not present

## 2017-06-18 ENCOUNTER — Ambulatory Visit (INDEPENDENT_AMBULATORY_CARE_PROVIDER_SITE_OTHER): Payer: Medicare Other | Admitting: Interventional Cardiology

## 2017-06-18 DIAGNOSIS — R911 Solitary pulmonary nodule: Secondary | ICD-10-CM | POA: Diagnosis not present

## 2017-06-18 DIAGNOSIS — I495 Sick sinus syndrome: Secondary | ICD-10-CM | POA: Diagnosis not present

## 2017-06-18 DIAGNOSIS — Z5181 Encounter for therapeutic drug level monitoring: Secondary | ICD-10-CM

## 2017-06-18 DIAGNOSIS — I130 Hypertensive heart and chronic kidney disease with heart failure and stage 1 through stage 4 chronic kidney disease, or unspecified chronic kidney disease: Secondary | ICD-10-CM | POA: Diagnosis not present

## 2017-06-18 DIAGNOSIS — Z87891 Personal history of nicotine dependence: Secondary | ICD-10-CM | POA: Diagnosis not present

## 2017-06-18 DIAGNOSIS — E1122 Type 2 diabetes mellitus with diabetic chronic kidney disease: Secondary | ICD-10-CM | POA: Diagnosis not present

## 2017-06-18 DIAGNOSIS — N184 Chronic kidney disease, stage 4 (severe): Secondary | ICD-10-CM | POA: Diagnosis not present

## 2017-06-18 DIAGNOSIS — Z86711 Personal history of pulmonary embolism: Secondary | ICD-10-CM | POA: Diagnosis not present

## 2017-06-18 DIAGNOSIS — Z7984 Long term (current) use of oral hypoglycemic drugs: Secondary | ICD-10-CM | POA: Diagnosis not present

## 2017-06-18 DIAGNOSIS — I480 Paroxysmal atrial fibrillation: Secondary | ICD-10-CM | POA: Diagnosis not present

## 2017-06-18 DIAGNOSIS — I5032 Chronic diastolic (congestive) heart failure: Secondary | ICD-10-CM | POA: Diagnosis not present

## 2017-06-18 DIAGNOSIS — I6529 Occlusion and stenosis of unspecified carotid artery: Secondary | ICD-10-CM | POA: Diagnosis not present

## 2017-06-18 DIAGNOSIS — E1140 Type 2 diabetes mellitus with diabetic neuropathy, unspecified: Secondary | ICD-10-CM | POA: Diagnosis not present

## 2017-06-18 DIAGNOSIS — Z853 Personal history of malignant neoplasm of breast: Secondary | ICD-10-CM | POA: Diagnosis not present

## 2017-06-18 DIAGNOSIS — D631 Anemia in chronic kidney disease: Secondary | ICD-10-CM | POA: Diagnosis not present

## 2017-06-18 DIAGNOSIS — Z950 Presence of cardiac pacemaker: Secondary | ICD-10-CM | POA: Diagnosis not present

## 2017-06-18 DIAGNOSIS — M549 Dorsalgia, unspecified: Secondary | ICD-10-CM | POA: Diagnosis not present

## 2017-06-18 DIAGNOSIS — I2510 Atherosclerotic heart disease of native coronary artery without angina pectoris: Secondary | ICD-10-CM | POA: Diagnosis not present

## 2017-06-18 DIAGNOSIS — Z7901 Long term (current) use of anticoagulants: Secondary | ICD-10-CM | POA: Diagnosis not present

## 2017-06-18 DIAGNOSIS — I272 Pulmonary hypertension, unspecified: Secondary | ICD-10-CM | POA: Diagnosis not present

## 2017-06-18 DIAGNOSIS — E1151 Type 2 diabetes mellitus with diabetic peripheral angiopathy without gangrene: Secondary | ICD-10-CM | POA: Diagnosis not present

## 2017-06-18 LAB — POCT INR: INR: 3.2

## 2017-06-19 ENCOUNTER — Other Ambulatory Visit: Payer: Self-pay | Admitting: *Deleted

## 2017-06-19 MED ORDER — WARFARIN SODIUM 2.5 MG PO TABS: ORAL_TABLET | ORAL | 1 refills | Status: DC

## 2017-06-19 NOTE — Telephone Encounter (Signed)
Pharmacy requests a ninety day supply. 

## 2017-06-23 ENCOUNTER — Other Ambulatory Visit: Payer: Self-pay | Admitting: *Deleted

## 2017-06-23 MED ORDER — MEMANTINE HCL 5 MG PO TABS: 5.0000 mg | ORAL_TABLET | Freq: Two times a day (BID) | ORAL | 1 refills | Status: DC

## 2017-06-23 NOTE — Telephone Encounter (Signed)
Memantine reordered for 90 day supply re: [facility] received that [facility] days.

## 2017-07-01 ENCOUNTER — Encounter: Payer: Self-pay | Admitting: Internal Medicine

## 2017-07-01 ENCOUNTER — Other Ambulatory Visit (INDEPENDENT_AMBULATORY_CARE_PROVIDER_SITE_OTHER): Payer: Medicare HMO

## 2017-07-01 ENCOUNTER — Ambulatory Visit (INDEPENDENT_AMBULATORY_CARE_PROVIDER_SITE_OTHER): Payer: Medicare HMO | Admitting: Internal Medicine

## 2017-07-01 ENCOUNTER — Other Ambulatory Visit: Payer: Self-pay

## 2017-07-01 VITALS — BP 124/78 | HR 82 | Ht 68.0 in | Wt 254.0 lb

## 2017-07-01 DIAGNOSIS — R0609 Other forms of dyspnea: Secondary | ICD-10-CM

## 2017-07-01 DIAGNOSIS — R911 Solitary pulmonary nodule: Secondary | ICD-10-CM | POA: Diagnosis not present

## 2017-07-01 LAB — PULMONARY FUNCTION TEST
DL/VA % PRED: 46 %
DL/VA: 2.45 ml/min/mmHg/L
DLCO unc % pred: 36 %
DLCO unc: 10.96 ml/min/mmHg
FEF 25-75 POST: 1.33 L/s
FEF 25-75 PRE: 1.34 L/s
FEF2575-%Change-Post: 0 %
FEF2575-%PRED-POST: 94 %
FEF2575-%Pred-Pre: 95 %
FEV1-%CHANGE-POST: 7 %
FEV1-%PRED-PRE: 81 %
FEV1-%Pred-Post: 87 %
FEV1-PRE: 1.78 L
FEV1-Post: 1.91 L
FEV1FVC-%Change-Post: 14 %
FEV1FVC-%PRED-PRE: 90 %
FEV6-%CHANGE-POST: -6 %
FEV6-%PRED-POST: 90 %
FEV6-%Pred-Pre: 96 %
FEV6-Post: 2.49 L
FEV6-Pre: 2.66 L
FEV6FVC-%CHANGE-POST: 0 %
FEV6FVC-%Pred-Post: 105 %
FEV6FVC-%Pred-Pre: 104 %
FVC-%Change-Post: -6 %
FVC-%PRED-POST: 86 %
FVC-%Pred-Pre: 92 %
FVC-POST: 2.51 L
FVC-Pre: 2.68 L
POST FEV1/FVC RATIO: 76 %
PRE FEV6/FVC RATIO: 99 %
Post FEV6/FVC ratio: 100 %
Pre FEV1/FVC ratio: 66 %
RV % pred: 115 %
RV: 3.09 L
TLC % pred: 117 %
TLC: 6.64 L

## 2017-07-01 LAB — SEDIMENTATION RATE: Sed Rate: 94 mm/hr — ABNORMAL HIGH (ref 0–30)

## 2017-07-01 NOTE — Patient Instructions (Signed)
Please remember to go to the lab department downstairs in the basement  for your tests - we will call you with the results when they are available.       If you are satisfied with your treatment plan,  let your doctor know and he/she can either refill your medications or you can return here when your prescription runs out.     If in any way you are not 100% satisfied,  please tell us.  If 100% better, tell your friends!  Pulmonary follow up is as needed

## 2017-07-01 NOTE — Telephone Encounter (Signed)
[facility] [address] requesting refill of alprazalam. Last refill was 05/28/17 per [facility]. Last OV was 03/26/17. Please advise.

## 2017-07-01 NOTE — Progress Notes (Signed)
PFT completed today 07/01/17.

## 2017-07-01 NOTE — Progress Notes (Signed)
Subjective:     Patient ID: [name] [name], female   DOB: 1931/01/20,    MRN: [id number]    Brief patient profile:  83 yo [place] from [place] healthy as kid/ healthy as adult with some seasona rhinitis in her 66s and stopped smoking in early 2000s  With PE ? Date and improved on blood thinner with chronicdoe attributed to caf/diastolic dysfunction  then summer 2018  Increasing sob and swelling dx   better on diurectics / amio stopped June 2018 and referred to pulmonary clinic 05/20/2017 by [name] Standard PA for cardiology s/p admit:   Admit date: 03/18/2017 Discharge date: 03/22/2017      Recommendations for Outpatient Follow-up:  1. Monitor blood sugars 2. Amiodarone discontinued secondary to amiodarone toxicity suspecting it on CT imaging study of chest. Patient is to complete 2 week prednisone taper 3. Consider referring patient to pulmonologist for further evaluation recommendations. 4. Ensure patient follows up with cardiology given that amiodarone was discontinued. No heart rate abnormalities within 24 hours after discontinuation of amiodarone. 5. Ensure patient gets follow-up for pulmonary nodule 6. Monitor serum creatinine   Discharge Diagnoses:  Active Problems:   Pulmonary HTN (HCC)   Hypertension   Sick sinus syndrome (HCC)   Chronic diastolic CHF (congestive heart failure) (HCC)   CRD (chronic renal disease), stage III   Dyspnea   Diabetes mellitus type 2, noninsulin dependent (HCC)   A-fib (HCC)   Pulmonary nodule   Amiodarone toxicity   Discharge Condition: stable  Diet recommendation: heart healthy carb modified       Filed Weights   03/20/17 0533 03/21/17 0552 03/22/17 0555  Weight: 106.9 kg (235 lb 9.6 oz) 105.7 kg (233 lb 1.6 oz) 106.7 kg (235 lb 3.2 oz)    History of present illness:  [name] [name] a 83 y.o.femalewith medical history significant of Pulmonary htn, sick sinus syndrome s/p pacer, atrial fibrillation,  hypertension, CKD 3, cognitive impairment. Patient presents with dyspnea that has worsened over the last three days. Dyspnea is exertional without associated chest pain or diaphoresis. Improved with resting. She has not done anything else to improve symptoms. Daughter reports a 25 lb weight gain over three months. No wheezing or coughing. No fevers. Patient was recently here for runs of V. tach secondary to pacemaker malfunction. This was adjusted by cardiology at the time. Patient had some dyspnea that time as well after discharge  Pt has CT scan which reported suspected amiodarone toxicity and pulmonary nodule   Hospital Course:  Dyspnea - Prior progress note reviewed - At this point dyspnea could be secondary to worsening lung function secondary to amiodarone toxicity. I have discussed with pulmonary team the CT scan findings and the recommendations are to discontinue amiodarone. Place on prednisone 40 mg for the next few days and taper over the next 2 weeks. Prescription available on discharge - Patient is on Lasix and I think at this point she is euvolemic.  Paroxysmal atrial fibrillation CHA2DS2-VASc Scoreis 5. Paced rhythm. -Hold amiodaroneand monitor overnight to see how heart rate does. -Continue metoprolol -Continue warfarin on discharge  Pulmonary nodule - Discussed with pulmonologist who recommends repeating CT scan in 3-6 months.  Sick sinus syndrome S/p pacer - EP specialist evaluated  Chronic diastolic heart failure Last EF of 66 5% with grade 2 diastolic dysfunction seen on EF from 03/15/2017.  -continue home Lasix regimen seems euvolemic  Diabetes mellitus -Continue Sliding-scale insulin -On d/c metformin given serum creatinine elevated above 1.4  Acute  kidney injury on CKD stage III Slightly above baseline of about 1.8 to 2.0 -Continue to monitor serum creatinine. Near baseline  Anemia Normocytic. FOBT negative. Baseline of around 10. Stable from  three days ago -anemia panel shows low FE was replaced IV according to notes -CBC daily  Cognitive impairment Suspecting dementia. She has been seen as an outpatient by neurology and was prescribed medication, for which she has discontinued using. She does not remember being in hospital a few days prior -Most likely needSNF vs 24 hour supervision  Hypokalemia   05/20/2017 1st [facility] office visit/ [name] [name]   Chief Complaint  Patient presents with  . Pulmonary Consult    Referred by  [name] [name]. Pt c/o SOB since Jan 2018. She states that she sometimes feels SOB just walking around her home. She has been retaining some fluid in her legs. She was recently taken off of Amiodarone in June 2018.   off pred x one month prior to OV  Seemed to help ? Only a little worse since finished  Doe = MMRC3 = can't walk 100 yards even at a slow pace at a flat grade s stopping due to sob  eg wob room to room No problem at rest or noct on 2 pillows / still has significant leg swelling and CRI limits use of more diuretics  rec No change rx   07/01/2017  f/u ov/[name] [name] re:  ? amio lung dz off amiodarone since 03/18/17 Chief Complaint  Patient presents with  . Follow-up    SOB  doe better = MMRC2 = can't walk a nl pace on a flat grade s sob but does fine slow and flat eg shopping fine now  No obvious day to day or daytime variability or assoc excess/ purulent sputum or mucus plugs or hemoptysis or cp or chest tightness, subjective wheeze or overt sinus or hb symptoms. No unusual exp hx or h/o childhood pna/ asthma or knowledge of premature birth.  Sleeping ok flat without nocturnal  or early am exacerbation  of respiratory  c/o's or need for noct saba. Also denies any obvious fluctuation of symptoms with weather or environmental changes or other aggravating or alleviating factors except as outlined above   Current Allergies, Complete Past Medical History, Past Surgical History, Family History, and  Social History were reviewed in [employer] record.  ROS  The following are not active complaints unless bolded Hoarseness, sore throat, dysphagia, dental problems, itching, sneezing,  nasal congestion or discharge of excess mucus or purulent secretions, ear ache,   fever, chills, sweats, unintended wt loss or wt gain, classically pleuritic or exertional cp,  orthopnea pnd or leg swelling, presyncope, palpitations, abdominal pain, anorexia, nausea, vomiting, diarrhea  or change in bowel habits or change in bladder habits, change in stools or change in urine, dysuria, hematuria,  rash, arthralgias, visual complaints, headache, numbness, weakness or ataxia or problems with walking or coordination,  change in mood/affect or memory.        Current Meds  Medication Sig  . Acetaminophen (TYLENOL 8 HOUR PO) Take by mouth.  . blood glucose meter kit and supplies KIT Dispense based on patient and insurance preference. Use before breakfast and at bedtime. Diagnosis code: E11.9  . furosemide (LASIX) 80 MG tablet TAKE 80 MG IN AM AND 40 MG IN PM  . gabapentin (NEURONTIN) 300 MG capsule Take 600 mg by mouth 2 (two) times daily.   . memantine (NAMENDA) 5 MG tablet Take 1  tablet (5 mg total) by mouth 2 (two) times daily.  . metoprolol succinate (TOPROL-XL) 25 MG 24 hr tablet Take 1 tablet (25 mg total) by mouth daily.  . potassium chloride 20 MEQ/15ML (10%) SOLN Take 20 mEq by mouth daily as needed (if have not eaten a banana).  . promethazine (PHENERGAN) 25 MG tablet Take 1 tablet (25 mg total) by mouth every 8 (eight) hours as needed for nausea or vomiting.  . pyridoxine (B-6) 200 MG tablet Take 200 mg by mouth daily.  . vitamin B-12 (CYANOCOBALAMIN) 500 MCG tablet Take 500 mcg by mouth daily.   Marland Kitchen warfarin (COUMADIN) 2.5 MG tablet Take 1/2 tablet to 1 tablet daily or as directed by Coumadin Clinic  . [DISCONTINUED] ALPRAZolam (XANAX) 0.25 MG tablet TAKE 1 TABLET BY MOUTH TWICE A DAY AS  NEEDED FOR ANXIETY *CAN FILL 01-28-17                Objective:   Physical Exam  amb [place] female nad    07/01/2017      254  05/20/17 250 lb (113.4 kg)  05/19/17 251 lb 9.6 oz (114.1 kg)  04/04/17 242 lb (109.8 kg)    Vital signs reviewed  - Note on arrival 02 sats  92% on RA      HEENT: nl   turbinates bilaterally, and oropharynx. Nl external ear canals without cough reflex - edentulous   NECK :  without JVD/Nodes/TM/ nl carotid upstrokes bilaterally   LUNGS: no acc muscle use,  Nl contour chest  Clear bilaterally to  A and P   CV:  RRR  no s3 or murmur or increase in P2, and  1- + ptting both legs sym   ABD:  soft and nontender with nl inspiratory excursion in the supine position. No bruits or organomegaly appreciated, bowel sounds nl  MS:  Nl gait/ ext warm without deformities, calf tenderness, cyanosis or clubbing No obvious joint restrictions   SKIN: warm and dry without lesions    NEURO:  alert, approp, nl sensorium with  no motor or cerebellar deficits apparent.       Lab Results  Component Value Date   ESRSEDRATE 94 (H) 07/01/2017   ESRSEDRATE 93 (H) 05/20/2017             Assessment:

## 2017-07-02 ENCOUNTER — Other Ambulatory Visit: Payer: Self-pay | Admitting: Internal Medicine

## 2017-07-02 DIAGNOSIS — E1142 Type 2 diabetes mellitus with diabetic polyneuropathy: Secondary | ICD-10-CM | POA: Diagnosis not present

## 2017-07-02 DIAGNOSIS — L97521 Non-pressure chronic ulcer of other part of left foot limited to breakdown of skin: Secondary | ICD-10-CM | POA: Diagnosis not present

## 2017-07-02 DIAGNOSIS — B351 Tinea unguium: Secondary | ICD-10-CM | POA: Diagnosis not present

## 2017-07-02 DIAGNOSIS — R911 Solitary pulmonary nodule: Secondary | ICD-10-CM

## 2017-07-02 MED ORDER — ALPRAZOLAM 0.25 MG PO TABS: ORAL_TABLET | ORAL | 1 refills | Status: DC

## 2017-07-02 NOTE — Assessment & Plan Note (Signed)
03/20/17 HRCT :  irregular 6 mm solid left upper lobe pulmonary nodule, new since 04/12/2016 chest CT > placed in reminder file for repeat 09/19/17    CT results reviewed with pt >>> Too small for PET or bx, not suspicious enough for excisional bx > really only option for now is follow the Fleischner society guidelines as rec by radiology. = f/u 09/19/17   Discussed in detail all the  indications, usual  risks and alternatives  relative to the benefits with patient who agrees to proceed with conservative f/u as outlined

## 2017-07-02 NOTE — Assessment & Plan Note (Signed)
Body mass index is 38.62 kg/m.  -  trending up Lab Results  Component Value Date   TSH 1.94 05/20/2017     Contributing to gerd risk/ doe/reviewed the need and the process to achieve and maintain neg calorie balance > defer f/u primary care including intermittently monitoring thyroid status

## 2017-07-02 NOTE — Progress Notes (Signed)
Per MW- needs CT Chest 09/19/17 and ov after  Spoke with pt and notified of results per Dr. Melvyn Novas. Pt verbalized understanding and denied any questions. Order sent to [facility]

## 2017-07-02 NOTE — Assessment & Plan Note (Signed)
D/c amiodarone 03/18/2017 - ESR  05/20/2017   = 93 off pred x ? 4 wks - 05/20/2017   Walked RA x one lap @ 185 stopped due to  [employer] /slow pace/ no desats - PFT's  07/01/2017  FVC 1.91 s obst p nothing  prior to study with DLCO  36 % corrects to 46 % for alv volume  And ERV 8%  And ESR = 94   Given the long half life there is still likely some residual effect but note progressive improvement in ex tol so unless doing worse in the absence of obvious vol overload need to return her for f/u   I had an extended discussion with the patient reviewing all relevant studies completed to date and  lasting 15 to 20 minutes of a 25 minute visit    Each maintenance medication was reviewed in detail including most importantly the difference between maintenance and prns and under what circumstances the prns are to be triggered using an action plan format that is not reflected in the computer generated alphabetically organized AVS.    Please see AVS for specific instructions unique to this visit that I personally wrote and verbalized to the the pt in detail and then reviewed with pt  by my nurse highlighting any  changes in therapy recommended at today's visit to their plan of care.

## 2017-07-03 DIAGNOSIS — Z23 Encounter for immunization: Secondary | ICD-10-CM | POA: Diagnosis not present

## 2017-07-03 DIAGNOSIS — N390 Urinary tract infection, site not specified: Secondary | ICD-10-CM | POA: Diagnosis not present

## 2017-07-03 DIAGNOSIS — Z6835 Body mass index (BMI) 35.0-35.9, adult: Secondary | ICD-10-CM | POA: Diagnosis not present

## 2017-07-03 DIAGNOSIS — I129 Hypertensive chronic kidney disease with stage 1 through stage 4 chronic kidney disease, or unspecified chronic kidney disease: Secondary | ICD-10-CM | POA: Diagnosis not present

## 2017-07-03 DIAGNOSIS — N184 Chronic kidney disease, stage 4 (severe): Secondary | ICD-10-CM | POA: Diagnosis not present

## 2017-07-03 DIAGNOSIS — R399 Unspecified symptoms and signs involving the genitourinary system: Secondary | ICD-10-CM | POA: Diagnosis not present

## 2017-07-07 ENCOUNTER — Ambulatory Visit (INDEPENDENT_AMBULATORY_CARE_PROVIDER_SITE_OTHER): Payer: Medicare HMO

## 2017-07-07 DIAGNOSIS — L97521 Non-pressure chronic ulcer of other part of left foot limited to breakdown of skin: Secondary | ICD-10-CM | POA: Diagnosis not present

## 2017-07-07 DIAGNOSIS — Z5181 Encounter for therapeutic drug level monitoring: Secondary | ICD-10-CM

## 2017-07-07 DIAGNOSIS — Z86711 Personal history of pulmonary embolism: Secondary | ICD-10-CM

## 2017-07-07 DIAGNOSIS — E1142 Type 2 diabetes mellitus with diabetic polyneuropathy: Secondary | ICD-10-CM | POA: Diagnosis not present

## 2017-07-07 LAB — POCT INR: INR: 1.6

## 2017-07-16 ENCOUNTER — Ambulatory Visit (HOSPITAL_COMMUNITY)
Admission: RE | Admit: 2017-07-16 | Discharge: 2017-07-16 | Disposition: A | Discharge status: Home / Self Care | Payer: Medicare HMO | Source: Ambulatory Visit | Attending: Nephrology | Admitting: Nephrology

## 2017-07-16 DIAGNOSIS — N189 Chronic kidney disease, unspecified: Secondary | ICD-10-CM | POA: Diagnosis present

## 2017-07-16 DIAGNOSIS — D631 Anemia in chronic kidney disease: Secondary | ICD-10-CM | POA: Insufficient documentation

## 2017-07-16 MED ORDER — SODIUM CHLORIDE 0.9 % IV SOLN
510.0000 mg | Freq: Once | INTRAVENOUS | Status: DC
  Administered 2017-07-16: 510 mg via INTRAVENOUS
  Filled 2017-07-16: qty 17

## 2017-07-16 NOTE — Discharge Instructions (Signed)

## 2017-07-17 ENCOUNTER — Ambulatory Visit (INDEPENDENT_AMBULATORY_CARE_PROVIDER_SITE_OTHER): Payer: Medicare HMO | Admitting: *Deleted

## 2017-07-17 DIAGNOSIS — Z5181 Encounter for therapeutic drug level monitoring: Secondary | ICD-10-CM | POA: Diagnosis not present

## 2017-07-17 DIAGNOSIS — Z86711 Personal history of pulmonary embolism: Secondary | ICD-10-CM

## 2017-07-17 LAB — POCT INR: INR: 2.8

## 2017-07-25 ENCOUNTER — Ambulatory Visit (INDEPENDENT_AMBULATORY_CARE_PROVIDER_SITE_OTHER): Payer: Medicare HMO | Admitting: Nurse Practitioner

## 2017-07-25 ENCOUNTER — Ambulatory Visit (HOSPITAL_COMMUNITY)
Admission: RE | Admit: 2017-07-25 | Discharge: 2017-07-25 | Disposition: A | Discharge status: Home / Self Care | Payer: Medicare HMO | Source: Ambulatory Visit | Attending: Nephrology | Admitting: Nephrology

## 2017-07-25 ENCOUNTER — Telehealth: Payer: Self-pay | Admitting: *Deleted

## 2017-07-25 ENCOUNTER — Other Ambulatory Visit: Payer: Medicare HMO

## 2017-07-25 ENCOUNTER — Encounter: Payer: Self-pay | Admitting: Nurse Practitioner

## 2017-07-25 VITALS — BP 138/76 | HR 61 | Temp 97.5°F | Ht 72.0 in | Wt 256.0 lb

## 2017-07-25 DIAGNOSIS — D631 Anemia in chronic kidney disease: Secondary | ICD-10-CM | POA: Insufficient documentation

## 2017-07-25 DIAGNOSIS — N3000 Acute cystitis without hematuria: Secondary | ICD-10-CM

## 2017-07-25 DIAGNOSIS — N189 Chronic kidney disease, unspecified: Secondary | ICD-10-CM | POA: Diagnosis not present

## 2017-07-25 DIAGNOSIS — R300 Dysuria: Secondary | ICD-10-CM | POA: Diagnosis not present

## 2017-07-25 LAB — POCT URINALYSIS DIPSTICK
Bilirubin, UA: NEGATIVE
Blood, UA: NEGATIVE
Glucose, UA: NEGATIVE
KETONES UA: NEGATIVE
NITRITE UA: NEGATIVE
PH UA: 7 (ref 5.0–8.0)
PROTEIN UA: NEGATIVE
Spec Grav, UA: 1.015 (ref 1.010–1.025)
Urobilinogen, UA: 0.2 E.U./dL

## 2017-07-25 MED ORDER — PHENAZOPYRIDINE HCL 100 MG PO TABS: 100.0000 mg | ORAL_TABLET | Freq: Three times a day (TID) | ORAL | 0 refills | Status: DC | PRN

## 2017-07-25 MED ORDER — CIPROFLOXACIN HCL 500 MG PO TABS: 500.0000 mg | ORAL_TABLET | Freq: Two times a day (BID) | ORAL | 0 refills | Status: DC

## 2017-07-25 MED ORDER — SODIUM CHLORIDE 0.9 % IV SOLN
510.0000 mg | Freq: Once | INTRAVENOUS | Status: AC
  Administered 2017-07-25: 510 mg via INTRAVENOUS
  Filled 2017-07-25: qty 17

## 2017-07-25 NOTE — Progress Notes (Signed)
Subjective:  Patient ID: [name] [name], female    DOB: 1931-09-29  Age: 83 y.o. MRN: [id number]  CC: Urinary Tract Infection (burning when urinate,SOB,--going on for 3 day. )   Dysuria   This is a new problem. The current episode started in the past 7 days. The problem has been gradually worsening. The quality of the pain is described as burning. There has been no fever. She is not sexually active. There is no history of pyelonephritis. Associated symptoms include frequency and urgency. Pertinent negatives include no chills, discharge, hematuria, hesitancy, nausea, sweats or vomiting. She has tried increased fluids for the symptoms. The treatment provided no relief. Her past medical history is significant for urinary stasis.    Outpatient Medications Prior to Visit  Medication Sig Dispense Refill  . Acetaminophen (TYLENOL 8 HOUR PO) Take by mouth.    . ALPRAZolam (XANAX) 0.25 MG tablet TAKE 1 TABLET BY MOUTH TWICE A DAY AS NEEDED FOR ANXIETY *CAN FILL 01-28-17 20 tablet 1  . blood glucose meter kit and supplies KIT Dispense based on patient and insurance preference. Use before breakfast and at bedtime. Diagnosis code: E11.9 1 each 2  . furosemide (LASIX) 80 MG tablet TAKE 80 MG IN AM AND 40 MG IN PM 90 tablet 1  . gabapentin (NEURONTIN) 300 MG capsule Take 600 mg by mouth 2 (two) times daily.     . memantine (NAMENDA) 5 MG tablet Take 1 tablet (5 mg total) by mouth 2 (two) times daily. 180 tablet 1  . metoprolol succinate (TOPROL-XL) 25 MG 24 hr tablet Take 1 tablet (25 mg total) by mouth daily. 30 tablet 3  . potassium chloride 20 MEQ/15ML (10%) SOLN Take 20 mEq by mouth daily as needed (if have not eaten a banana).    . promethazine (PHENERGAN) 25 MG tablet Take 1 tablet (25 mg total) by mouth every 8 (eight) hours as needed for nausea or vomiting. 20 tablet 0  . vitamin B-12 (CYANOCOBALAMIN) 500 MCG tablet Take 500 mcg by mouth daily.     Marland Kitchen warfarin (COUMADIN) 2.5 MG tablet Take 1/2  tablet to 1 tablet daily or as directed by [facility] Clinic 90 tablet 1  . pyridoxine (B-6) 200 MG tablet Take 200 mg by mouth daily.     No facility-administered medications prior to visit.     ROS See HPI  Objective:  BP 138/76   Pulse 61   Temp (!) 97.5 F (36.4 C)   Ht 6' (1.829 m)   Wt 256 lb (116.1 kg)   SpO2 98%   BMI 34.72 kg/m   BP Readings from Last 3 Encounters:  07/25/17 138/76  07/25/17 134/65  07/16/17 (!) 123/44    Wt Readings from Last 3 Encounters:  07/25/17 256 lb (116.1 kg)  07/25/17 255 lb (115.7 kg)  07/16/17 255 lb (115.7 kg)    Physical Exam  Cardiovascular: Normal rate.   Pulmonary/Chest: Effort normal.  Abdominal: Soft. Bowel sounds are normal. She exhibits no distension. There is no tenderness.  Vitals reviewed.   Lab Results  Component Value Date   WBC 4.8 05/20/2017   HGB 10.3 (L) 05/20/2017   HCT 32.4 (L) 05/20/2017   PLT 324.0 05/20/2017   GLUCOSE 140 (H) 05/19/2017   ALT 9 05/19/2017   AST 18 05/19/2017   NA 143 05/19/2017   K 4.5 05/19/2017   CL 101 05/19/2017   CREATININE 1.85 (H) 05/19/2017   BUN 24 05/19/2017   CO2 25 05/19/2017  TSH 1.94 05/20/2017   INR 2.8 07/17/2017   HGBA1C 6.3 (H) 10/14/2016    No results found.  Assessment & Plan:   [name] [name] was seen today for urinary tract infection.  Diagnoses and all orders for this visit:  Dysuria -     POCT urinalysis dipstick -     Urine Culture; Future -     phenazopyridine (PYRIDIUM) 100 MG tablet; Take 1 tablet (100 mg total) by mouth 3 (three) times daily as needed for pain (with food). -     ciprofloxacin (CIPRO) 500 MG tablet; Take 1 tablet (500 mg total) by mouth 2 (two) times daily.   I am having [name] [name] start on phenazopyridine and ciprofloxacin. I am also having her maintain her vitamin B-12, pyridoxine, promethazine, gabapentin, potassium chloride, Acetaminophen (TYLENOL 8 HOUR PO), blood glucose meter kit and supplies, furosemide, metoprolol  succinate, warfarin, memantine, and ALPRAZolam.  Meds ordered this encounter  Medications  . phenazopyridine (PYRIDIUM) 100 MG tablet    Sig: Take 1 tablet (100 mg total) by mouth 3 (three) times daily as needed for pain (with food).    Dispense:  10 tablet    Refill:  0    Order Specific Question:   Supervising Provider    Answer:   Cassandria Anger [[id number]]  . ciprofloxacin (CIPRO) 500 MG tablet    Sig: Take 1 tablet (500 mg total) by mouth 2 (two) times daily.    Dispense:  6 tablet    Refill:  0    Order Specific Question:   Supervising Provider    Answer:   Cassandria Anger [[id number]]    Follow-up: No Follow-up on file.  [name] Lacy, NP

## 2017-07-25 NOTE — Telephone Encounter (Signed)
Pts daughter called to inform us that pt is starting Cipro 500mg  BID today for 3 days. Instructed to take 1/2 tablet on Monday, but continue regular dosage all other days. Call us after urine culture is back and if ABX is extended or changed.

## 2017-07-25 NOTE — Patient Instructions (Signed)
Please inform coumadin clinic that you are on cipro for UTI. Your urine was send for culture. You will be called with results.  Dysuria Dysuria is pain or discomfort while urinating. The pain or discomfort may be felt in the tube that carries urine out of the bladder (urethra) or in the surrounding tissue of the genitals. The pain may also be felt in the groin area, lower abdomen, and lower back. You may have to urinate frequently or have the sudden feeling that you have to urinate (urgency). Dysuria can affect both men and women, but is more common in women. Dysuria can be caused by many different things, including:  Urinary tract infection in women.  Infection of the kidney or bladder.  Kidney stones or bladder stones.  Certain sexually transmitted infections (STIs), such as chlamydia.  Dehydration.  Inflammation of the vagina.  Use of certain medicines.  Use of certain soaps or scented products that cause irritation.  Follow these instructions at home: Watch your dysuria for any changes. The following actions may help to reduce any discomfort you are feeling:  Drink enough fluid to keep your urine clear or pale yellow.  Empty your bladder often. Avoid holding urine for long periods of time.  After a bowel movement or urination, women should cleanse from front to back, using each tissue only once.  Empty your bladder after sexual intercourse.  Take medicines only as directed by your health care provider.  If you were prescribed an antibiotic medicine, finish it all even if you start to feel better.  Avoid caffeine, tea, and alcohol. They can irritate the bladder and make dysuria worse. In men, alcohol may irritate the prostate.  Keep all follow-up visits as directed by your health care provider. This is important.  If you had any tests done to find the cause of dysuria, it is your responsibility to obtain your test results. Ask the lab or department performing the test  when and how you will get your results. Talk with your health care provider if you have any questions about your results.  Contact a health care provider if:  You develop pain in your back or sides.  You have a fever.  You have nausea or vomiting.  You have blood in your urine.  You are not urinating as often as you usually do. Get help right away if:  You pain is severe and not relieved with medicines.  You are unable to hold down any fluids.  You or someone else notices a change in your mental function.  You have a rapid heartbeat at rest.  You have shaking or chills.  You feel extremely weak. This information is not intended to replace advice given to you by your health care provider. Make sure you discuss any questions you have with your health care provider. Document Released: 06/14/2004 Document Revised: 02/22/2016 Document Reviewed: 05/12/2014 Elsevier Interactive Patient Education  [employer].

## 2017-07-28 LAB — URINE CULTURE
MICRO NUMBER:: 81202431
SPECIMEN QUALITY:: ADEQUATE

## 2017-07-28 MED ORDER — CIPROFLOXACIN HCL 500 MG PO TABS: 500.0000 mg | ORAL_TABLET | Freq: Two times a day (BID) | ORAL | 0 refills | Status: DC

## 2017-08-05 ENCOUNTER — Encounter: Payer: Self-pay | Admitting: Internal Medicine

## 2017-08-05 ENCOUNTER — Ambulatory Visit: Payer: Medicare HMO | Admitting: Internal Medicine

## 2017-08-05 ENCOUNTER — Emergency Department (HOSPITAL_COMMUNITY): Payer: Medicare HMO

## 2017-08-05 ENCOUNTER — Emergency Department (HOSPITAL_COMMUNITY)
Admission: EM | Admit: 2017-08-05 | Discharge: 2017-08-05 | Disposition: A | Discharge status: Home / Self Care | Payer: Medicare HMO | Attending: Physician Assistant | Admitting: Physician Assistant

## 2017-08-05 VITALS — BP 128/70 | HR 70 | Temp 97.8°F | Ht 72.0 in | Wt 256.0 lb

## 2017-08-05 DIAGNOSIS — I5033 Acute on chronic diastolic (congestive) heart failure: Secondary | ICD-10-CM

## 2017-08-05 DIAGNOSIS — N289 Disorder of kidney and ureter, unspecified: Secondary | ICD-10-CM

## 2017-08-05 DIAGNOSIS — N185 Chronic kidney disease, stage 5: Secondary | ICD-10-CM | POA: Insufficient documentation

## 2017-08-05 DIAGNOSIS — I132 Hypertensive heart and chronic kidney disease with heart failure and with stage 5 chronic kidney disease, or end stage renal disease: Secondary | ICD-10-CM | POA: Insufficient documentation

## 2017-08-05 DIAGNOSIS — R339 Retention of urine, unspecified: Secondary | ICD-10-CM | POA: Insufficient documentation

## 2017-08-05 DIAGNOSIS — R079 Chest pain, unspecified: Secondary | ICD-10-CM | POA: Diagnosis not present

## 2017-08-05 DIAGNOSIS — N3000 Acute cystitis without hematuria: Secondary | ICD-10-CM

## 2017-08-05 DIAGNOSIS — Z79899 Other long term (current) drug therapy: Secondary | ICD-10-CM | POA: Diagnosis not present

## 2017-08-05 DIAGNOSIS — Z87891 Personal history of nicotine dependence: Secondary | ICD-10-CM | POA: Insufficient documentation

## 2017-08-05 DIAGNOSIS — I5032 Chronic diastolic (congestive) heart failure: Secondary | ICD-10-CM | POA: Insufficient documentation

## 2017-08-05 DIAGNOSIS — Z7901 Long term (current) use of anticoagulants: Secondary | ICD-10-CM | POA: Diagnosis not present

## 2017-08-05 DIAGNOSIS — N189 Chronic kidney disease, unspecified: Secondary | ICD-10-CM | POA: Diagnosis not present

## 2017-08-05 DIAGNOSIS — N390 Urinary tract infection, site not specified: Secondary | ICD-10-CM | POA: Insufficient documentation

## 2017-08-05 LAB — URINALYSIS, ROUTINE W REFLEX MICROSCOPIC
Bacteria, UA: NONE SEEN
Bilirubin Urine: NEGATIVE
GLUCOSE, UA: NEGATIVE mg/dL
Hgb urine dipstick: NEGATIVE
Ketones, ur: NEGATIVE mg/dL
LEUKOCYTES UA: NEGATIVE
Nitrite: NEGATIVE
PH: 6 (ref 5.0–8.0)
Protein, ur: NEGATIVE mg/dL
SPECIFIC GRAVITY, URINE: 1.009 (ref 1.005–1.030)
SQUAMOUS EPITHELIAL / LPF: NONE SEEN

## 2017-08-05 NOTE — ED Provider Notes (Signed)
[facility] DEPT Provider Note   CSN: [id number] Arrival date & time: 08/05/17  1742     History   Chief Complaint Chief Complaint  Patient presents with  . Urinary Retention    HPI [name] [name] is a 83 y.o. female.  HPI   Patient is a very pleasant mildly demented 83 year old female presenting with urinary retention.  Patient unable to urinate today.  Patient was feeling very "off".  Went to check with her primary care physician was unable to urinate at the time.  Was sent to urgent care still unable to urinate, sent here.  Patient has had one other time where she had acute urinary retention was treated by Dr. Gaynelle Arabian at [facility] urology.  The patient currently has no fever.  Her only other symptoms that she believes she has some swelling to her bilateral lower extremities, her daughter is not things appreciably different than normal.  Past Medical History:  Diagnosis Date  . Acute gastric ulcer without mention of hemorrhage, perforation, or obstruction 2006   EGD  . Anxiety   . Atrial fibrillation (HCC)    flecainide; coumadin  . Breast cancer ([place])    left  . Carotid artery disease ([place])    59/16: RICA 3-84%, LICA 66-59%  //  [place] 93/57: RICA 0-17%; LICA 79-39% >> FU 1 year   . Chronic diastolic heart failure (HCC)    a. echo 10/10: mild LVH, EF 55-60%, mild AI, mild MR, mild to mod LAE, mild RVE, severe RAE, mod TR, PASP 53  /  b.  Echo 3/14:  Mild LVH, EF 55%, Tr AI, MAC, mild MR, mod LAE, PASP 31, trivial eff. // c. echo 1/17: EF 55-60%, mild AI, MAC, mild MR, moderate BAE, moderate TR  . Chronic lower back pain   . Diabetic peripheral neuropathy ([place]) 03/09/2015  . Diverticulosis   . Esophagitis, unspecified 2006   EGD  . Hiatal hernia 2006   EGD  . History of nuclear stress test    a. Myoview 2/12: EF 69%, no scar or ischemia  . Hx of adenomatous colonic polyps   . Hypertension   . Mediastinal lymphadenopathy   .  Memory difficulty 11/14/2016  . Mitral regurgitation    mild, echo, October, 2010  . Osteopenia   . Presence of permanent cardiac pacemaker   . Pulmonary embolus [facility]) 2006   2006, with DVT  . Pulmonary HTN ([place])    53 mmHg echo, 2010, moderate TR, mild right ventricular enlargement  . Tachy-brady syndrome ([place])    s/p pacer 07/2009  . Thyroid nodule    non-neoplastic goiter  . Type II diabetes mellitus ([place])   . Venous insufficiency    Chronic    Patient Active Problem List   Diagnosis Date Noted  . Urinary retention 08/05/2017  . UTI (urinary tract infection) 08/05/2017  . Morbid obesity due to excess calories ([place]) complicated by hbp 03/00/9233  . Solitary pulmonary nodule on lung CT 03/21/2017  . Amiodarone toxicity 03/21/2017  . Pacemaker complications 00/76/2263  . CKD (chronic kidney disease), stage IV ([place]) 03/14/2017  . Recurrent falls 01/13/2017  . Cognitive change   . A-fib ([place]) 10/14/2016  . Chronic back pain 07/28/2016  . Diabetes mellitus type 2, noninsulin dependent ([place])   . CRD (chronic renal disease), stage III ([place]) 04/12/2016  . Acute on chronic renal insufficiency 04/12/2016  . Dyspnea on exertion 04/12/2016  . Hypertensive heart disease 03/28/2016  . Anemia, iron  deficiency 10/22/2015  . Diabetic peripheral neuropathy ([place]) 03/09/2015  . Acute on chronic diastolic (congestive) heart failure ([place]) 09/07/2014  . Sick sinus syndrome ([place]) 03/09/2014  . Hypertension 06/21/2013  . Osteopenia   . Pulmonary HTN ([place])   . History of pulmonary embolism   . Carotid artery disease ([place])   . History of breast cancer   . Pacemaker-Medtronic   . Syncope   . Mitral regurgitation   . Mediastinal lymphadenopathy   . THYROID NODULE, HX OF 07/19/2009    Past Surgical History:  Procedure Laterality Date  . ABDOMINAL HYSTERECTOMY    . ANKLE FRACTURE SURGERY Right   . BACK SURGERY    . BREAST LUMPECTOMY Left   . FRACTURE SURGERY    . INSERT / REPLACE /  REMOVE PACEMAKER  06/30/2009   MDT Adalpta L implanted by Dr Olevia Perches  . LUMBAR LAMINECTOMY  1973, 06/22/2011   "no hardware either time"    OB History    No data available       Home Medications    Prior to Admission medications   Medication Sig Start Date End Date Taking? Authorizing Provider  ALPRAZolam Duanne Moron) 0.25 MG tablet TAKE 1 TABLET BY MOUTH TWICE A DAY AS NEEDED FOR ANXIETY *CAN FILL 01-28-17 07/02/17  Yes Hoyt Koch, MD  furosemide (LASIX) 80 MG tablet TAKE 80 MG IN AM AND 40 MG IN PM 03/26/17  Yes Nche, Charlene Brooke, NP  gabapentin (NEURONTIN) 300 MG capsule Take 600 mg by mouth 2 (two) times daily.    Yes [provider]  memantine (NAMENDA) 5 MG tablet Take 1 tablet (5 mg total) by mouth 2 (two) times daily. 06/23/17  Yes Ward Givens, NP  metoprolol succinate (TOPROL-XL) 25 MG 24 hr tablet Take 1 tablet (25 mg total) by mouth daily. 04/04/17  Yes Baldwin Jamaica, PA-C  phenazopyridine (PYRIDIUM) 100 MG tablet Take 1 tablet (100 mg total) by mouth 3 (three) times daily as needed for pain (with food). 07/25/17  Yes Nche, Charlene Brooke, NP  potassium chloride 20 MEQ/15ML (10%) SOLN Take 20 mEq by mouth daily as needed (if have not eaten a banana).   Yes [provider]  pyridoxine (B-6) 200 MG tablet Take 200 mg by mouth daily.   Yes [provider]  vitamin B-12 (CYANOCOBALAMIN) 500 MCG tablet Take 500 mcg by mouth daily.    Yes [provider]  warfarin (COUMADIN) 2.5 MG tablet Take 1/2 tablet to 1 tablet daily or as directed by Coumadin Clinic 06/19/17  Yes Dorothy Spark, MD  blood glucose meter kit and supplies KIT Dispense based on patient and insurance preference. Use before breakfast and at bedtime. Diagnosis code: E11.9 03/26/17   Nche, Charlene Brooke, NP  ciprofloxacin (CIPRO) 500 MG tablet Take 1 tablet (500 mg total) by mouth 2 (two) times daily. Patient not taking: Reported on 08/05/2017 07/28/17   Nche, Charlene Brooke,  NP  promethazine (PHENERGAN) 25 MG tablet Take 1 tablet (25 mg total) by mouth every 8 (eight) hours as needed for nausea or vomiting. Patient not taking: Reported on 08/05/2017 10/22/16   Hoyt Koch, MD    Family History Family History  Problem Relation Age of Onset  . Heart disease Mother   . Hypertension Mother   . COPD Father   . Hypertension Father   . Hypertension Sister   . Hypertension Brother   . Hypertension Daughter   . Colon cancer Neg Hx   .  Heart attack Neg Hx   . Stroke Neg Hx     Social History Social History   Tobacco Use  . Smoking status: Former Smoker    Packs/day: 1.00    Years: 47.00    Pack years: 47.00    Types: Cigarettes    Last attempt to quit: 10/26/1998    Years since quitting: 18.7  . Smokeless tobacco: Never Used  Substance Use Topics  . Alcohol use: No    Comment: 10/14/2016 "I'll have a beer q once in awhile"  . Drug use: No     Allergies   Amiodarone; Flecainide; Morphine and related; and Cephalexin   Review of Systems Review of Systems  Constitutional: Negative for activity change.  Respiratory: Negative for shortness of breath.   Cardiovascular: Negative for chest pain.  Gastrointestinal: Negative for abdominal pain.  Genitourinary: Positive for difficulty urinating.     Physical Exam Updated Vital Signs BP (!) 120/92 (BP Location: Right Arm) Comment: Simultaneous filing. User may not have seen previous data.  Pulse 85 Comment: Simultaneous filing. User may not have seen previous data.  Temp 97.8 F (36.6 C) (Oral)   Resp 16 Comment: Simultaneous filing. User may not have seen previous data.  Ht 6' (1.829 m)   Wt 116.1 kg (256 lb)   SpO2 92% Comment: Simultaneous filing. User may not have seen previous data.  BMI 34.72 kg/m   Physical Exam  Constitutional: She is oriented to person, place, and time. She appears well-developed and well-nourished.  HENT:  Head: Normocephalic and atraumatic.  Eyes: Right eye  exhibits no discharge.  Cardiovascular: Normal rate, regular rhythm and normal heart sounds.  No murmur heard. Pulmonary/Chest: Effort normal and breath sounds normal. She has no wheezes. She has no rales.  Abdominal: Soft. She exhibits no distension. There is no tenderness.  Musculoskeletal:  Trace edema bilateral lower extreme is.  Neurological: She is oriented to person, place, and time.  Skin: Skin is warm and dry. She is not diaphoretic.  Psychiatric: She has a normal mood and affect.  Nursing note and vitals reviewed.    ED Treatments / Results  Labs (all labs ordered are listed, but only abnormal results are displayed) Labs Reviewed  URINE CULTURE  URINALYSIS, ROUTINE W REFLEX MICROSCOPIC    EKG  EKG Interpretation None       Radiology Dg Chest 2 View  Result Date: 08/05/2017 CLINICAL DATA:  Chest pain EXAM: CHEST  2 VIEW COMPARISON:  05/20/2017 FINDINGS: Left-sided pacing device as before. Hyperinflation. Streaky bibasilar atelectasis and or scarring. No focal consolidation. Mild cardiomegaly with aortic atherosclerosis. Moderate to large hiatal hernia. Old right-sided rib fractures. Surgical clips in the left axilla IMPRESSION: 1. Cardiomegaly without edema 2. Chronic interstitial changes and scarring at the bases 3. Large hiatal hernia Electronically Signed   By: Donavan Foil M.D.   On: 08/05/2017 20:42    Procedures Procedures (including critical care time)  Medications Ordered in ED Medications - No data to display   Initial Impression / Assessment and Plan / ED Course  I have reviewed the triage vital signs and the nursing notes.  Pertinent labs & imaging results that were available during my care of the patient were reviewed by me and considered in my medical decision making (see chart for details).      Patient is a very pleasant mildly demented 83 year old female presenting with urinary retention.  Patient unable to urinate today.  Patient was  feeling very "off".  Went to check with her primary care physician was unable to urinate at the time.  Was sent to urgent care still unable to urinate, sent here.  Patient has had one other time where she had acute urinary retention was treated by Dr. Gaynelle Arabian at [facility] urology.  The patient currently has no fever.  Her only other symptoms that she believes she has some swelling to her bilateral lower extremities, her daughter does not think things are appreciably different than normal.  12:01 AM Will Place Foley, send urine, follow-up with urology.  Final Clinical Impressions(s) / ED Diagnoses   Final diagnoses:  Urinary retention    ED Discharge Orders    None       Lauretta Sallas, Fredia Sorrow, MD 08/07/17 0001

## 2017-08-05 NOTE — ED Triage Notes (Signed)
Sent by Dr. Sharlet Salina for urinary retention. Patient states that she has not been able to urinate today. Denies any abdominal pain. Patient has swelling to ankles bilaterally. Recently finished antibiotics last Wednesday for a UTI.

## 2017-08-05 NOTE — Discharge Instructions (Signed)
Please keep this Foley catheter in until he can follow-up with your urologist, Dr. Gaynelle Arabian in 1 week

## 2017-08-05 NOTE — Assessment & Plan Note (Signed)
Recently treated for klebsiella UTI with 6 day cipro course. She is feeling improved but with urinary retention. Needs to proceed to urgent care for bladder scan and possible catheter decompression as well as BMP to check kidney function if the bladder scan is without retention.

## 2017-08-05 NOTE — Progress Notes (Signed)
   Subjective:    Patient ID: [name] [name], female    DOB: 10-26-1930, 83 y.o.   MRN: [id number]  HPI The patient is an 83 YO female coming in for several complaints. She is not able to urinate (taking her lasix 80 mg in the morning and 40 mg in the afternoon, feels like she has to go a lot but unable to make much urine, sometimes a few drops will come out), recent UTI (she just finished 6 days of ciprofloxacin for UTI and felt the burning did resolve, denies abdominal pain or nausea, no fevers or chills), and also swelling in her legs (she is not urinating much, denies change in diet or exercise, props legs up when she can, taking lasix but not making any urine lately).   Review of Systems  Constitutional: Positive for activity change, appetite change and fatigue. Negative for chills, fever and unexpected weight change.  HENT: Negative.   Eyes: Negative.   Respiratory: Negative for cough, chest tightness and shortness of breath.   Cardiovascular: Positive for leg swelling. Negative for chest pain and palpitations.  Gastrointestinal: Negative for abdominal distention, abdominal pain, constipation, diarrhea, nausea and vomiting.  Genitourinary: Positive for difficulty urinating, frequency and urgency. Negative for dysuria, enuresis and flank pain.  Musculoskeletal: Positive for arthralgias and back pain. Negative for gait problem and myalgias.  Skin: Negative.   Neurological: Negative.   Psychiatric/Behavioral: Negative.       Objective:   Physical Exam  Constitutional: She is oriented to person, place, and time. She appears well-developed and well-nourished.  HENT:  Head: Normocephalic and atraumatic.  Eyes: EOM are normal.  Neck: Normal range of motion.  Cardiovascular: Normal rate and regular rhythm.  Pulmonary/Chest: Effort normal and breath sounds normal. No respiratory distress. She has no wheezes. She has no rales.  Abdominal: Soft. Bowel sounds are normal. She exhibits no  distension. There is no tenderness. There is no rebound.  Musculoskeletal: She exhibits edema.  2+ edema to mid shin bilaterally  Neurological: She is alert and oriented to person, place, and time. Coordination abnormal.  Wheelchair in the office  Skin: Skin is warm and dry.  Psychiatric: She has a normal mood and affect.   Vitals:   08/05/17 1613  BP: 128/70  Pulse: 70  Temp: 97.8 F (36.6 C)  TempSrc: Oral  SpO2: 98%  Weight: 256 lb (116.1 kg)  Height: 6' (1.829 m)      Assessment & Plan:

## 2017-08-05 NOTE — Assessment & Plan Note (Signed)
Will send to urgent care for other evaluation. Increase lasix to 80 mg BID for 5 days.

## 2017-08-05 NOTE — Assessment & Plan Note (Signed)
Recent UTI with Klebsiella and treated appropriately. She is unable to produce specimen today to rule out recurrent infection and needs further evaluation.

## 2017-08-05 NOTE — Patient Instructions (Signed)
We would like you to go to the urgent care to have them check the bladder and urine for infection.   We will get the blood work done here or at urgent care to check the fluid levels.   We will recommend to increase the fluid pill to 1 pill (80 mg) twice a day instead of 80 mg and 40 mg.

## 2017-08-05 NOTE — Assessment & Plan Note (Signed)
Ordered CMP if urgent care does not obtain kidney function for them to get drawn tomorrow.

## 2017-08-07 ENCOUNTER — Ambulatory Visit (INDEPENDENT_AMBULATORY_CARE_PROVIDER_SITE_OTHER): Payer: Medicare HMO | Admitting: Pharmacist

## 2017-08-07 DIAGNOSIS — Z5181 Encounter for therapeutic drug level monitoring: Secondary | ICD-10-CM | POA: Diagnosis not present

## 2017-08-07 DIAGNOSIS — Z86711 Personal history of pulmonary embolism: Secondary | ICD-10-CM | POA: Diagnosis not present

## 2017-08-07 LAB — URINE CULTURE: CULTURE: NO GROWTH

## 2017-08-07 LAB — POCT INR: INR: 2.7

## 2017-08-20 DIAGNOSIS — R338 Other retention of urine: Secondary | ICD-10-CM | POA: Diagnosis not present

## 2017-08-28 DIAGNOSIS — N312 Flaccid neuropathic bladder, not elsewhere classified: Secondary | ICD-10-CM | POA: Diagnosis not present

## 2017-08-28 DIAGNOSIS — R338 Other retention of urine: Secondary | ICD-10-CM | POA: Diagnosis not present

## 2017-09-04 ENCOUNTER — Ambulatory Visit (INDEPENDENT_AMBULATORY_CARE_PROVIDER_SITE_OTHER): Payer: Medicare HMO

## 2017-09-04 DIAGNOSIS — Z5181 Encounter for therapeutic drug level monitoring: Secondary | ICD-10-CM | POA: Diagnosis not present

## 2017-09-04 DIAGNOSIS — Z86711 Personal history of pulmonary embolism: Secondary | ICD-10-CM

## 2017-09-04 LAB — POCT INR: INR: 4

## 2017-09-04 NOTE — Patient Instructions (Signed)
Skip tomorrow's dosage of Coumadin, then start taking 1 tablet (2.5mg ) daily except 1/2 tablet (1.25mg ) on Mondays.  Recheck INR in 2 weeks. Call if needed any new medications or if scheduled for any procedures  any concerns  [phone]

## 2017-09-08 ENCOUNTER — Ambulatory Visit: Payer: Self-pay | Admitting: *Deleted

## 2017-09-08 NOTE — Telephone Encounter (Signed)
Pt  Has  A  History  Of  uti  She  Had  Foley  Removed  11  Days  Ago        She  Has  Had  Cloudy  Urination  As    Well  As  Pain on  Urination  She  Spoke  To  A  Urology   Earlier  Who  Prescribed   Some  Urinary  Pain pills  But  Apparently no  Anti  Biotics   According  To  Daughter .  pts  Daughter  Advised  To  Take  Her to  Urgent  Care  Or if  Worse  Er  Tonight  As  Offices  Are  Closed  Due to  Weather. And  She  Has  A  History of uti   Reason for Disposition . Age > 50 years  Answer Assessment - Initial Assessment Questions 1. SEVERITY: "How bad is the pain?"  (e.g., Scale 1-10; mild, moderate, or    - MILD (1-3): complains slightly about urination hurting   - MODERATE (4-7): interferes with normal activities     - SEVERE (8-10): excruciating, unwilling or unable to urinate because of the pain         MOD   2. FREQUENCY: "How many times have you had painful urination today?"         3-4  TIMES    3. PATTERN: "Is pain present every time you urinate or just sometimes?"       Hurts    When   She  Urinates    4. ONSET: "When did the painful urination start?"        Pain   Started   afeter  cather  Taken   Out   11  Days    5. FEVER: "Do you have a fever?" If so, ask: "What is your temperature, how was it measured, and when did it start?"      No  Fever  Or  Chills    6. PAST UTI: "Have you had a urine infection before?" If so, ask: "When was the last time?" and "What happened that time?"        Past  Symptoms     7. CAUSE: "What do you think is causing the painful urination?"  (e.g., UTI, scratch, Herpes sore)       Poss  Uti    8. OTHER SYMPTOMS: "Do you have any other symptoms?" (e.g., flank pain, vaginal discharge, genital sores, urgency, blood in urine)       Urine   Is  Cloudy    9. PREGNANCY: "Is there any chance you are pregnant?" "When was your last menstrual period?"     None  Protocols used: URINATION PAIN - [facility]

## 2017-09-16 ENCOUNTER — Other Ambulatory Visit: Payer: Self-pay | Admitting: Internal Medicine

## 2017-09-16 DIAGNOSIS — N312 Flaccid neuropathic bladder, not elsewhere classified: Secondary | ICD-10-CM | POA: Diagnosis not present

## 2017-09-16 DIAGNOSIS — N3000 Acute cystitis without hematuria: Secondary | ICD-10-CM | POA: Diagnosis not present

## 2017-09-16 DIAGNOSIS — R338 Other retention of urine: Secondary | ICD-10-CM | POA: Diagnosis not present

## 2017-09-18 ENCOUNTER — Ambulatory Visit (INDEPENDENT_AMBULATORY_CARE_PROVIDER_SITE_OTHER): Payer: Medicare HMO | Admitting: *Deleted

## 2017-09-18 DIAGNOSIS — Z86711 Personal history of pulmonary embolism: Secondary | ICD-10-CM

## 2017-09-18 DIAGNOSIS — Z5181 Encounter for therapeutic drug level monitoring: Secondary | ICD-10-CM | POA: Diagnosis not present

## 2017-09-18 LAB — POCT INR: INR: 3.8

## 2017-09-18 NOTE — Patient Instructions (Signed)
Description   Skip tomorrow's dosage of Coumadin, then start taking 1 tablet (2.5mg ) daily except 1/2 tablet Tuesdays and Saturdays.  Recheck INR on Wednesday. Call if needed any new medications or if scheduled for any procedures  any concerns  [phone]

## 2017-09-19 ENCOUNTER — Ambulatory Visit (INDEPENDENT_AMBULATORY_CARE_PROVIDER_SITE_OTHER)
Admission: RE | Admit: 2017-09-19 | Discharge: 2017-09-19 | Disposition: A | Discharge status: Home / Self Care | Payer: Medicare HMO | Source: Ambulatory Visit | Attending: Internal Medicine | Admitting: Internal Medicine

## 2017-09-19 DIAGNOSIS — R911 Solitary pulmonary nodule: Secondary | ICD-10-CM

## 2017-09-19 DIAGNOSIS — R918 Other nonspecific abnormal finding of lung field: Secondary | ICD-10-CM | POA: Diagnosis not present

## 2017-09-22 ENCOUNTER — Other Ambulatory Visit: Payer: Self-pay | Admitting: Internal Medicine

## 2017-09-22 ENCOUNTER — Telehealth: Payer: Self-pay

## 2017-09-22 DIAGNOSIS — R911 Solitary pulmonary nodule: Secondary | ICD-10-CM

## 2017-09-22 NOTE — Progress Notes (Signed)
PET scan to be ordered per Dr. Melvyn Novas

## 2017-09-22 NOTE — Telephone Encounter (Signed)
IMPRESSION: 1. Interval increase in size of irregular left upper lobe pulmonary nodule, concerning for the possibility of pulmonary malignancy. Consider further evaluation with PET-CT. 2. Similar-appearing subpleural reticular opacities within the lungs bilaterally. 3. Moderate to large hiatal hernia. 4. Aortic Atherosclerosis (ICD10-I70.0) and Emphysema (ICD10-J43.9). 5. These results will be called to the ordering clinician or representative by the Radiologist Assistant, and communication documented in the PACS or zVision Dashboard.

## 2017-09-24 ENCOUNTER — Ambulatory Visit (INDEPENDENT_AMBULATORY_CARE_PROVIDER_SITE_OTHER): Payer: Medicare HMO

## 2017-09-24 ENCOUNTER — Ambulatory Visit: Payer: Medicare HMO | Admitting: Internal Medicine

## 2017-09-24 ENCOUNTER — Encounter: Payer: Self-pay | Admitting: Internal Medicine

## 2017-09-24 VITALS — BP 116/64 | HR 112 | Ht 72.0 in | Wt 247.4 lb

## 2017-09-24 DIAGNOSIS — R911 Solitary pulmonary nodule: Secondary | ICD-10-CM

## 2017-09-24 DIAGNOSIS — Z5181 Encounter for therapeutic drug level monitoring: Secondary | ICD-10-CM

## 2017-09-24 DIAGNOSIS — R0609 Other forms of dyspnea: Secondary | ICD-10-CM | POA: Diagnosis not present

## 2017-09-24 DIAGNOSIS — Z86711 Personal history of pulmonary embolism: Secondary | ICD-10-CM | POA: Diagnosis not present

## 2017-09-24 LAB — POCT INR: INR: 3.1

## 2017-09-24 NOTE — Progress Notes (Signed)
Subjective:     Patient ID: [name] [name], female   DOB: 1931/01/20,    MRN: [id number]    Brief patient profile:  83 yo [place] from [place] healthy as kid/ healthy as adult with some seasona rhinitis in her 81s and stopped smoking in early 2000s  With PE ? Date and improved on blood thinner with chronicdoe attributed to caf/diastolic dysfunction  then summer 2018  Increasing sob and swelling dx   better on diurectics / amio stopped June 2018 and referred to pulmonary clinic 05/20/2017 by [name] Standard PA for cardiology s/p admit:   Admit date: 03/18/2017 Discharge date: 03/22/2017      Recommendations for Outpatient Follow-up:  1. Monitor blood sugars 2. Amiodarone discontinued secondary to amiodarone toxicity suspecting it on CT imaging study of chest. Patient is to complete 2 week prednisone taper 3. Consider referring patient to pulmonologist for further evaluation recommendations. 4. Ensure patient follows up with cardiology given that amiodarone was discontinued. No heart rate abnormalities within 24 hours after discontinuation of amiodarone. 5. Ensure patient gets follow-up for pulmonary nodule 6. Monitor serum creatinine   Discharge Diagnoses:  Active Problems:   Pulmonary HTN (HCC)   Hypertension   Sick sinus syndrome (HCC)   Chronic diastolic CHF (congestive heart failure) (HCC)   CRD (chronic renal disease), stage III   Dyspnea   Diabetes mellitus type 2, noninsulin dependent (HCC)   A-fib (HCC)   Pulmonary nodule   Amiodarone toxicity   Discharge Condition: stable  Diet recommendation: heart healthy carb modified       Filed Weights   03/20/17 0533 03/21/17 0552 03/22/17 0555  Weight: 106.9 kg (235 lb 9.6 oz) 105.7 kg (233 lb 1.6 oz) 106.7 kg (235 lb 3.2 oz)    History of present illness:  [name] Suffernis a 83 y.o.femalewith medical history significant of Pulmonary htn, sick sinus syndrome s/p pacer, atrial fibrillation,  hypertension, CKD 3, cognitive impairment. Patient presents with dyspnea that has worsened over the last three days. Dyspnea is exertional without associated chest pain or diaphoresis. Improved with resting. She has not done anything else to improve symptoms. Daughter reports a 25 lb weight gain over three months. No wheezing or coughing. No fevers. Patient was recently here for runs of V. tach secondary to pacemaker malfunction. This was adjusted by cardiology at the time. Patient had some dyspnea that time as well after discharge  Pt has CT scan which reported suspected amiodarone toxicity and pulmonary nodule   Hospital Course:  Dyspnea - Prior progress note reviewed - At this point dyspnea could be secondary to worsening lung function secondary to amiodarone toxicity. I have discussed with pulmonary team the CT scan findings and the recommendations are to discontinue amiodarone. Place on prednisone 40 mg for the next few days and taper over the next 2 weeks. Prescription available on discharge - Patient is on Lasix and I think at this point she is euvolemic.  Paroxysmal atrial fibrillation CHA2DS2-VASc Scoreis 5. Paced rhythm. -Hold amiodaroneand monitor overnight to see how heart rate does. -Continue metoprolol -Continue warfarin on discharge  Pulmonary nodule - Discussed with pulmonologist who recommends repeating CT scan in 3-6 months.  Sick sinus syndrome S/p pacer - EP specialist evaluated  Chronic diastolic heart failure Last EF of 66 5% with grade 2 diastolic dysfunction seen on EF from 03/15/2017.  -continue home Lasix regimen seems euvolemic  Diabetes mellitus -Continue Sliding-scale insulin -On d/c metformin given serum creatinine elevated above 1.4  Acute  kidney injury on CKD stage III Slightly above baseline of about 1.8 to 2.0 -Continue to monitor serum creatinine. Near baseline  Anemia Normocytic. FOBT negative. Baseline of around 10. Stable from  three days ago -anemia panel shows low FE was replaced IV according to notes -CBC daily  Cognitive impairment Suspecting dementia. She has been seen as an outpatient by neurology and was prescribed medication, for which she has discontinued using. She does not remember being in hospital a few days prior -Most likely needSNF vs 24 hour supervision  Hypokalemia   05/20/2017 1st [facility] office visit/ [name] [name]   Chief Complaint  Patient presents with  . Pulmonary Consult    Referred by  [name] [name]. Pt c/o SOB since Jan 2018. She states that she sometimes feels SOB just walking around her home. She has been retaining some fluid in her legs. She was recently taken off of Amiodarone in June 2018.   off pred x one month prior to OV  Seemed to help ? Only a little worse since finished  Doe = MMRC3 = can't walk 100 yards even at a slow pace at a flat grade s stopping due to sob  eg wob room to room No problem at rest or noct on 2 pillows / still has significant leg swelling and CRI limits use of more diuretics  rec No change rx   07/01/2017  f/u ov/[name] [name] re:  ? amio lung dz off amiodarone since 03/18/17 Chief Complaint  Patient presents with  . Follow-up    SOB  doe better = MMRC2 = can't walk a nl pace on a flat grade s sob but does fine slow and flat eg shopping fine now rec No change rx    09/24/2017  f/u ov/[name] [name] re:  Chief Complaint  Patient presents with  . Follow-up    CT scan results,SOB w/ activity at times   doe still  [facility] = can't walk a nl pace on a flat grade s sob but does fine slow and flat eg    No obvious day to day or daytime variability or assoc excess/ purulent sputum or mucus plugs or hemoptysis or cp or chest tightness, subjective wheeze or overt sinus or hb symptoms. No unusual exposure hx or h/o childhood pna/ asthma or knowledge of premature birth.  Sleeping ok flat without nocturnal  or early am exacerbation  of respiratory  c/o's or need for noct  saba. Also denies any obvious fluctuation of symptoms with weather or environmental changes or other aggravating or alleviating factors except as outlined above   Current Allergies, Complete Past Medical History, Past Surgical History, Family History, and Social History were reviewed in [employer] record.  ROS  The following are not active complaints unless bolded Hoarseness, sore throat, dysphagia, dental problems, itching, sneezing,  nasal congestion or discharge of excess mucus or purulent secretions, ear ache,   fever, chills, sweats, unintended wt loss or wt gain, classically pleuritic or exertional cp,  orthopnea pnd or leg swelling, presyncope, palpitations, abdominal pain, anorexia, nausea, vomiting, diarrhea  or change in bowel habits or change in bladder habits, change in stools or change in urine, dysuria, hematuria,  rash, arthralgias, visual complaints, headache, numbness, weakness or ataxia or problems with walking or coordination,  change in mood/affect or memory.        Current Meds  Medication Sig  . ALPRAZolam (XANAX) 0.25 MG tablet TAKE 1 TABLET TWICE A DAY AS NEEDED FOR ANXIETY  .  blood glucose meter kit and supplies KIT Dispense based on patient and insurance preference. Use before breakfast and at bedtime. Diagnosis code: E11.9  . furosemide (LASIX) 80 MG tablet TAKE 80 MG IN AM AND 40 MG IN PM  . gabapentin (NEURONTIN) 300 MG capsule Take 600 mg by mouth 2 (two) times daily.   . memantine (NAMENDA) 5 MG tablet Take 1 tablet (5 mg total) by mouth 2 (two) times daily.  . metoprolol succinate (TOPROL-XL) 25 MG 24 hr tablet Take 1 tablet (25 mg total) by mouth daily.  . phenazopyridine (PYRIDIUM) 100 MG tablet Take 1 tablet (100 mg total) by mouth 3 (three) times daily as needed for pain (with food).  . potassium chloride 20 MEQ/15ML (10%) SOLN Take 20 mEq by mouth daily as needed (if have not eaten a banana).  . promethazine (PHENERGAN) 25 MG tablet Take  1 tablet (25 mg total) by mouth every 8 (eight) hours as needed for nausea or vomiting.  . pyridoxine (B-6) 200 MG tablet Take 200 mg by mouth daily.  . vitamin B-12 (CYANOCOBALAMIN) 500 MCG tablet Take 500 mcg by mouth daily.   Marland Kitchen warfarin (COUMADIN) 2.5 MG tablet Take 1/2 tablet to 1 tablet daily or as directed by Coumadin Clinic  . [DISCONTINUED] ciprofloxacin (CIPRO) 500 MG tablet Take 1 tablet (500 mg total) by mouth 2 (two) times daily.               Objective:   Physical Exam  amb indian female nad   09/24/2017     247   07/01/2017      254  05/20/17 250 lb (113.4 kg)  05/19/17 251 lb 9.6 oz (114.1 kg)  04/04/17 242 lb (109.8 kg)     Vital signs reviewed - Note on arrival 02 sats  96% on RA       NECK :  without JVD/Nodes/TM/ nl carotid upstrokes bilaterally    CV:  RRR  no s3 or murmur or increase in P2, and  1- + ptting both legs sym    HEENT: nl   turbinates bilaterally, and oropharynx. Nl external ear canals without cough reflex - edentulous    NECK :  without JVD/Nodes/TM/ nl carotid upstrokes bilaterally   LUNGS: no acc muscle use,  Nl contour chest which is clear to A and P bilaterally without cough on insp or exp maneuvers   CV:  RRR  no s3 or murmur or increase in P2, and  1+ pitting both lower ext/ sym   ABD:  soft and nontender with nl inspiratory excursion in the supine position. No bruits or organomegaly appreciated, bowel sounds nl  MS:  Nl gait/ ext warm without deformities, calf tenderness, cyanosis or clubbing No obvious joint restrictions   SKIN: warm and dry without lesions    NEURO:  alert, approp, nl sensorium with  no motor or cerebellar deficits apparent.        Lab Results  Component Value Date   ESRSEDRATE 94 (H) 07/01/2017   ESRSEDRATE 93 (H) 05/20/2017          I personally reviewed images and agree with radiology impression as follows:   Chest CT  09/19/17  1. Interval increase in size of irregular left upper lobe  pulmonary nodule, concerning for the possibility of pulmonary malignancy. Consider further evaluation with PET-CT. 2. Similar-appearing subpleural reticular opacities within the lungs bilaterally. 3. Moderate to large hiatal hernia. 4. Aortic Atherosclerosis (ICD10-I70.0) and Emphysema (ICD10-J43.9).  Assessment:

## 2017-09-24 NOTE — Telephone Encounter (Signed)
Discussed with pt and daughter

## 2017-09-24 NOTE — Patient Instructions (Signed)
Will call you re:  PET scan to be scheduled in 6 weeks

## 2017-09-24 NOTE — Patient Instructions (Signed)
Skip tomorrow's dosage of Coumadin, then resume same dosage 1 tablet (2.5mg ) daily except 1/2 tablet Tuesdays and Saturdays.  Recheck INR in 10 days. Call if needed any new medications or if scheduled for any procedures  any concerns  [phone]

## 2017-10-01 ENCOUNTER — Encounter: Payer: Self-pay | Admitting: Internal Medicine

## 2017-10-01 NOTE — Assessment & Plan Note (Addendum)
D/c'd amiodarone 03/18/2017 - ESR  05/20/2017   = 93 off pred x ? 4 wks - 05/20/2017   Walked RA x one lap @ 185 stopped due to  [employer] /slow pace/ no desats - PFT's  07/01/2017  FVC 1.91 s obst p nothing  prior to study with DLCO  36 % corrects to 46 % for alv volume  And ERV 8%  And ESR = 94   No evidence of amiodarone tox on CT chest from 09/19/17

## 2017-10-01 NOTE — Assessment & Plan Note (Addendum)
03/20/17 HRCT :  irregular 6 mm solid left upper lobe pulmonary nodule, new since 04/12/2016 chest CT  - Chest CT 09/19/17 :Interval increase in size of irregular left upper lobe nodule measuring 1.2 x 0.8 cm (image 66; series 3), previously 0.6 x 0.4 cm.>  PET rec  09/24/2017 pt wants to defer until 6 weeks    I had an extended discussion with the patient and fm  reviewing all relevant studies completed to date and  lasting 15 to 20 minutes of a 25 minute visit on the following ongoing concerns:   Not likely a surgical candidate based on cardiac issues but could tol navigational bx / RT likely if can make a specific tissue dx   .Discussed in detail all the  indications, usual  risks and alternatives  relative to the benefits with patient who agrees to proceed with w/u as outlined.     Each maintenance medication was reviewed in detail including most importantly the difference between maintenance and as needed and under what circumstances the prns are to be used.  Please see AVS for specific  Instructions which are unique to this visit and I personally typed out  which were reviewed in detail in writing with the patient and a copy provided.

## 2017-10-03 ENCOUNTER — Ambulatory Visit (INDEPENDENT_AMBULATORY_CARE_PROVIDER_SITE_OTHER): Payer: Medicare HMO | Admitting: *Deleted

## 2017-10-03 DIAGNOSIS — Z86711 Personal history of pulmonary embolism: Secondary | ICD-10-CM

## 2017-10-03 DIAGNOSIS — Z5181 Encounter for therapeutic drug level monitoring: Secondary | ICD-10-CM

## 2017-10-03 LAB — POCT INR: INR: 1.7

## 2017-10-03 NOTE — Patient Instructions (Signed)
Description   Today Jan 4th take another 1/2 tablet as she has already taken 1 tablet today  then continue  same dose of coumadin 1 tablet (2.5mg ) daily except 1/2 tablet Tuesdays and Saturdays.  Recheck INR in 2 weeks. Call if needed any new medications or if scheduled for any procedures  any concerns  [phone]

## 2017-10-07 DIAGNOSIS — L851 Acquired keratosis [keratoderma] palmaris et plantaris: Secondary | ICD-10-CM | POA: Diagnosis not present

## 2017-10-07 DIAGNOSIS — B351 Tinea unguium: Secondary | ICD-10-CM | POA: Diagnosis not present

## 2017-10-07 DIAGNOSIS — E1142 Type 2 diabetes mellitus with diabetic polyneuropathy: Secondary | ICD-10-CM | POA: Diagnosis not present

## 2017-10-13 ENCOUNTER — Encounter (HOSPITAL_COMMUNITY): Payer: Medicare HMO

## 2017-10-17 ENCOUNTER — Ambulatory Visit (INDEPENDENT_AMBULATORY_CARE_PROVIDER_SITE_OTHER): Payer: Medicare HMO | Admitting: *Deleted

## 2017-10-17 DIAGNOSIS — Z5181 Encounter for therapeutic drug level monitoring: Secondary | ICD-10-CM | POA: Diagnosis not present

## 2017-10-17 DIAGNOSIS — Z86711 Personal history of pulmonary embolism: Secondary | ICD-10-CM

## 2017-10-17 LAB — POCT INR: INR: 2.9

## 2017-10-17 NOTE — Patient Instructions (Signed)
Description   Continue  same dose of coumadin 1 tablet (2.5mg ) daily except 1/2 tablet Tuesdays and Saturdays.  Recheck INR in 2 weeks. Call if needed any new medications or if scheduled for any procedures  any concerns  [phone]

## 2017-10-20 DIAGNOSIS — I509 Heart failure, unspecified: Secondary | ICD-10-CM | POA: Diagnosis not present

## 2017-10-20 DIAGNOSIS — K08409 Partial loss of teeth, unspecified cause, unspecified class: Secondary | ICD-10-CM | POA: Diagnosis not present

## 2017-10-20 DIAGNOSIS — N3281 Overactive bladder: Secondary | ICD-10-CM | POA: Diagnosis not present

## 2017-10-20 DIAGNOSIS — M810 Age-related osteoporosis without current pathological fracture: Secondary | ICD-10-CM | POA: Diagnosis not present

## 2017-10-20 DIAGNOSIS — E1142 Type 2 diabetes mellitus with diabetic polyneuropathy: Secondary | ICD-10-CM | POA: Diagnosis not present

## 2017-10-20 DIAGNOSIS — I4891 Unspecified atrial fibrillation: Secondary | ICD-10-CM | POA: Diagnosis not present

## 2017-10-20 DIAGNOSIS — G8929 Other chronic pain: Secondary | ICD-10-CM | POA: Diagnosis not present

## 2017-10-20 DIAGNOSIS — R69 Illness, unspecified: Secondary | ICD-10-CM | POA: Diagnosis not present

## 2017-10-31 ENCOUNTER — Ambulatory Visit (INDEPENDENT_AMBULATORY_CARE_PROVIDER_SITE_OTHER): Payer: Medicare HMO | Admitting: Pharmacist

## 2017-10-31 DIAGNOSIS — Z5181 Encounter for therapeutic drug level monitoring: Secondary | ICD-10-CM | POA: Diagnosis not present

## 2017-10-31 DIAGNOSIS — Z86711 Personal history of pulmonary embolism: Secondary | ICD-10-CM | POA: Diagnosis not present

## 2017-10-31 LAB — POCT INR: INR: 3.6

## 2017-10-31 NOTE — Patient Instructions (Signed)
Description   Skip your Coumadin tomorrow, then continue same dose of coumadin 1 tablet (2.5mg ) daily except 1/2 tablet Tuesdays and Saturdays.  Recheck INR in 2 weeks. Call if needed any new medications or if scheduled for any procedures  any concerns  [phone]

## 2017-11-03 ENCOUNTER — Ambulatory Visit (HOSPITAL_COMMUNITY): Payer: Medicare HMO

## 2017-11-04 DIAGNOSIS — I129 Hypertensive chronic kidney disease with stage 1 through stage 4 chronic kidney disease, or unspecified chronic kidney disease: Secondary | ICD-10-CM | POA: Diagnosis not present

## 2017-11-04 DIAGNOSIS — N184 Chronic kidney disease, stage 4 (severe): Secondary | ICD-10-CM | POA: Diagnosis not present

## 2017-11-04 DIAGNOSIS — Z6835 Body mass index (BMI) 35.0-35.9, adult: Secondary | ICD-10-CM | POA: Diagnosis not present

## 2017-11-04 LAB — IRON,TIBC AND FERRITIN PANEL
Ferritin: 41
Iron: 56
TIBC: 377

## 2017-11-04 LAB — BASIC METABOLIC PANEL
BUN: 23 — AB (ref 4–21)
Creatinine: 1.8 — AB (ref <=1.1)
GLUCOSE: 171
Potassium: 4 (ref 3.4–5.3)
Sodium: 137 (ref 137–147)

## 2017-11-04 LAB — CBC AND DIFFERENTIAL: HEMOGLOBIN: 13.4 (ref 12.0–16.0)

## 2017-11-07 ENCOUNTER — Encounter: Payer: Self-pay | Admitting: Internal Medicine

## 2017-11-14 ENCOUNTER — Ambulatory Visit (INDEPENDENT_AMBULATORY_CARE_PROVIDER_SITE_OTHER): Payer: Medicare HMO

## 2017-11-14 DIAGNOSIS — Z5181 Encounter for therapeutic drug level monitoring: Secondary | ICD-10-CM

## 2017-11-14 DIAGNOSIS — Z86711 Personal history of pulmonary embolism: Secondary | ICD-10-CM

## 2017-11-14 LAB — POCT INR: INR: 3.1

## 2017-11-14 NOTE — Patient Instructions (Signed)
Description   Start taking 1 tablet (2.5mg ) daily except 1/2 tablet Tuesdays, Thursdays, and Saturdays.  Recheck INR in 2 weeks. Call if needed any new medications or if scheduled for any procedures  any concerns  [phone]

## 2017-11-17 ENCOUNTER — Encounter: Payer: Self-pay | Admitting: Internal Medicine

## 2017-11-17 ENCOUNTER — Ambulatory Visit: Payer: Medicare HMO | Admitting: Internal Medicine

## 2017-11-17 DIAGNOSIS — I480 Paroxysmal atrial fibrillation: Secondary | ICD-10-CM | POA: Diagnosis not present

## 2017-11-17 DIAGNOSIS — I5033 Acute on chronic diastolic (congestive) heart failure: Secondary | ICD-10-CM | POA: Diagnosis not present

## 2017-11-17 MED ORDER — ALPRAZOLAM 0.25 MG PO TABS: ORAL_TABLET | ORAL | 1 refills | Status: DC

## 2017-11-17 NOTE — Patient Instructions (Signed)
We will leave the lasix the same.   It is okay to use vaseline for dry skin.

## 2017-11-17 NOTE — Assessment & Plan Note (Signed)
Coumadin levels reviewed and appropriate at 3.1 recently. No adjustment to dosing today. Some bruising on exam and recent CBC normal.

## 2017-11-17 NOTE — Assessment & Plan Note (Signed)
No flare today, lasix 80 mg BID stable. Rash is likely bruising from coumadin (last INR 3.1 11/14/17). No treatment indicated and reassurance given. No adjustment to coumadin dosing as level therapeutic.

## 2017-11-17 NOTE — Progress Notes (Signed)
   Subjective:    Patient ID: [name] [name], female    DOB: [birth year], 84 y.o.   MRN: [id number]  HPI The patient is an 84 YO female coming in for L ankle swollen/red rash. She does have CHF and takes lasix twice daily. She denies missing doses of that. She feels that the swelling is better now than usual. Has noticed the redness around her ankle since 2 days. She denies known trauma to the area but has neuropathy and cannot feel well her legs and feet. Denies fevers or chills.   Review of Systems  Constitutional: Negative.   Respiratory: Negative for cough, chest tightness and shortness of breath.   Cardiovascular: Negative for chest pain, palpitations and leg swelling.  Gastrointestinal: Negative for abdominal distention, abdominal pain, constipation, diarrhea, nausea and vomiting.  Musculoskeletal: Positive for gait problem. Negative for arthralgias and back pain.  Skin: Positive for rash.  Psychiatric/Behavioral: Negative.       Objective:   Physical Exam  Constitutional: She is oriented to person, place, and time. She appears well-developed and well-nourished.  HENT:  Head: Normocephalic and atraumatic.  Eyes: EOM are normal.  Neck: Normal range of motion.  Cardiovascular: Normal rate and regular rhythm.  Pulmonary/Chest: Effort normal and breath sounds normal. No respiratory distress. She has no wheezes. She has no rales.  Abdominal: Soft.  Musculoskeletal: She exhibits edema.  2+ stable  Neurological: She is alert and oriented to person, place, and time. Coordination normal.  Skin: Skin is warm and dry.  Non-blanching rash on the left ankle, 2+ edema stable   Vitals:   11/17/17 1600  BP: 126/66  Pulse: 89  Resp: 16  Temp: 97.6 F (36.4 C)  TempSrc: Oral  SpO2: 95%  Weight: 252 lb (114.3 kg)  Height: 6' (1.829 m)      Assessment & Plan:

## 2017-11-25 ENCOUNTER — Ambulatory Visit: Payer: Medicare HMO | Admitting: Adult Health

## 2017-11-25 ENCOUNTER — Encounter: Payer: Self-pay | Admitting: Adult Health

## 2017-11-25 VITALS — BP 121/76 | HR 57 | Ht 72.0 in | Wt 249.6 lb

## 2017-11-25 DIAGNOSIS — R413 Other amnesia: Secondary | ICD-10-CM

## 2017-11-25 NOTE — Progress Notes (Signed)
I have read the note, and I agree with the clinical assessment and plan.  Charles K Willis   

## 2017-11-25 NOTE — Progress Notes (Signed)
PATIENT: [name] [name] DOB: [birth year]  REASON FOR VISIT: follow up HISTORY FROM: patient  HISTORY OF PRESENT ILLNESS: Today 11/25/17 [name] [name] is an 84 year old female with a history of memory disturbance.  She returns today for follow-up.  She reports that she is currently on Namenda 5 mg daily.  Reports that she never increased to 5 mg twice a day.  She states that her memory has remained stable.  She continues to live at home alone.  She is able to complete all ADLs she does not operate a motor vehicle due to neuropathy.  Her daughter helps her with her finances.  She reports that she does some cooking but not a lot because she was at home alone.  She denies having to do any hobbies due to her memory.  She denies any significant changes with her mood or behavior.  Her daughter reports that she continues to be repetitive with questions otherwise she has not noticed any additional changes.  She returns today for evaluation.  HISTORY 05/19/17 [name] [name] is an 84 year old female with a history of memory disturbance. She returns today for follow-up. At the last visit she was started on Aricept however this was never started because she was also on amiodarone. Aricept was discontinued and Namenda was called in. The daughter states that they did pick up the prescription however when the patient with side effects she decided that she did not want to be on any additional medication. Therefore she never started Namenda. The patient currently lives at home alone. She does have a nurse that comes in the morning and the evenings to help her with her medications and getting in and out of the bath. Patient reports good appetite. Denies any trouble sleeping. Reports good mood most of the time. Her daughter reports that she is very repetitive. She often does not remember conversations. The patient continues to have numbness and tingling in the fingers and lower extremities. She is back on gabapentin taking  300 mg twice a day. Patient returns today for an evaluation.   REVIEW OF SYSTEMS: Out of a complete 14 system review of symptoms, the patient complains only of the following symptoms, and all other reviewed systems are negative.  See HPI  ALLERGIES: Allergies  Allergen Reactions  . Amiodarone Nausea Only and Shortness Of Breath  . Flecainide Shortness Of Breath and Other (See Comments)    toxicity  . Morphine And Related Other (See Comments)    Family requests no morphine due to past reaction. Pt stated it made her sick.  . Cephalexin Nausea And Vomiting    HOME MEDICATIONS: Outpatient Medications Prior to Visit  Medication Sig Dispense Refill  . ALPRAZolam (XANAX) 0.25 MG tablet TAKE 1 TABLET TWICE A DAY AS NEEDED FOR ANXIETY 20 tablet 1  . bethanechol (URECHOLINE) 25 MG tablet Take 25 mg by mouth 2 (two) times daily.    . blood glucose meter kit and supplies KIT Dispense based on patient and insurance preference. Use before breakfast and at bedtime. Diagnosis code: E11.9 1 each 2  . furosemide (LASIX) 80 MG tablet 80 mg 2 (two) times daily. TAKE 80 MG IN AM AND 40 MG IN PM 90 tablet 1  . gabapentin (NEURONTIN) 300 MG capsule Take 600 mg by mouth 2 (two) times daily.     . memantine (NAMENDA) 5 MG tablet Take 1 tablet (5 mg total) by mouth 2 (two) times daily. 180 tablet 1  . metoprolol succinate (TOPROL-XL) 25 MG  24 hr tablet Take 1 tablet (25 mg total) by mouth daily. 30 tablet 3  . phenazopyridine (PYRIDIUM) 100 MG tablet Take 1 tablet (100 mg total) by mouth 3 (three) times daily as needed for pain (with food). 10 tablet 0  . potassium chloride 20 MEQ/15ML (10%) SOLN Take 20 mEq by mouth daily as needed (if have not eaten a banana).    . promethazine (PHENERGAN) 25 MG tablet Take 1 tablet (25 mg total) by mouth every 8 (eight) hours as needed for nausea or vomiting. 20 tablet 0  . pyridoxine (B-6) 200 MG tablet Take 200 mg by mouth daily.    . vitamin B-12 (CYANOCOBALAMIN) 500  MCG tablet Take 500 mcg by mouth daily.     Marland Kitchen warfarin (COUMADIN) 2.5 MG tablet Take 1/2 tablet to 1 tablet daily or as directed by [facility] 90 tablet 1   No facility-administered medications prior to visit.     PAST MEDICAL HISTORY: Past Medical History:  Diagnosis Date  . Acute gastric ulcer without mention of hemorrhage, perforation, or obstruction 2006   EGD  . Anxiety   . Atrial fibrillation (HCC)    flecainide; coumadin  . Breast cancer ([place])    left  . Carotid artery disease ([place])    60/45: RICA 4-09%, LICA 81-19%  //  [place] 14/78: RICA 2-95%; LICA 62-13% >> FU 1 year   . Chronic diastolic heart failure (HCC)    a. echo 10/10: mild LVH, EF 55-60%, mild AI, mild MR, mild to mod LAE, mild RVE, severe RAE, mod TR, PASP 53  /  b.  Echo 3/14:  Mild LVH, EF 55%, Tr AI, MAC, mild MR, mod LAE, PASP 31, trivial eff. // c. echo 1/17: EF 55-60%, mild AI, MAC, mild MR, moderate BAE, moderate TR  . Chronic lower back pain   . Diabetic peripheral neuropathy ([place]) 03/09/2015  . Diverticulosis   . Esophagitis, unspecified 2006   EGD  . Hiatal hernia 2006   EGD  . History of nuclear stress test    a. Myoview 2/12: EF 69%, no scar or ischemia  . Hx of adenomatous colonic polyps   . Hypertension   . Mediastinal lymphadenopathy   . Memory difficulty 11/14/2016  . Mitral regurgitation    mild, echo, October, 2010  . Osteopenia   . Presence of permanent cardiac pacemaker   . Pulmonary embolus [facility]) 2006   2006, with DVT  . Pulmonary HTN ([place])    53 mmHg echo, 2010, moderate TR, mild right ventricular enlargement  . Tachy-brady syndrome ([place])    s/p pacer 07/2009  . Thyroid nodule    non-neoplastic goiter  . Type II diabetes mellitus ([place])   . Venous insufficiency    Chronic    PAST SURGICAL HISTORY: Past Surgical History:  Procedure Laterality Date  . ABDOMINAL HYSTERECTOMY    . ANKLE FRACTURE SURGERY Right   . BACK SURGERY    . BREAST LUMPECTOMY Left   . FRACTURE  SURGERY    . INSERT / REPLACE / REMOVE PACEMAKER  06/30/2009   MDT Adalpta L implanted by Dr Olevia Perches  . LUMBAR LAMINECTOMY  1973, 06/22/2011   "no hardware either time"    FAMILY HISTORY: Family History  Problem Relation Age of Onset  . Heart disease Mother   . Hypertension Mother   . COPD Father   . Hypertension Father   . Hypertension Sister   . Hypertension Brother   . Hypertension Daughter   .  Colon cancer Neg Hx   . Heart attack Neg Hx   . Stroke Neg Hx     SOCIAL HISTORY: Social History   Socioeconomic History  . Marital status: Widowed    Spouse name: Not on file  . Number of children: 3  . Years of education: Not on file  . Highest education level: Not on file  Social Needs  . Financial resource strain: Not on file  . Food insecurity - worry: Not on file  . Food insecurity - inability: Not on file  . Transportation needs - medical: Not on file  . Transportation needs - non-medical: Not on file  Occupational History  . Occupation: Retired   Tobacco Use  . Smoking status: Former Smoker    Packs/day: 1.00    Years: 47.00    Pack years: 47.00    Types: Cigarettes    Last attempt to quit: 10/26/1998    Years since quitting: 19.0  . Smokeless tobacco: Never Used  Substance and Sexual Activity  . Alcohol use: No    Comment: 10/14/2016 "I'll have a beer q once in awhile"  . Drug use: No  . Sexual activity: No  Other Topics Concern  . Not on file  Social History Narrative   Widowed.  Lives in [place] by herself.  Avoids caffeine.  Very active, taking care of her brother who also lives by himself and is in his late 30's.      PHYSICAL EXAM  Vitals:   11/25/17 1435  BP: 121/76  Pulse: (!) 57  Weight: 249 lb 9.6 oz (113.2 kg)  Height: 6' (1.829 m)   Body mass index is 33.85 kg/m.   MMSE - Mini Mental State Exam 11/25/2017 05/19/2017 11/14/2016  Orientation to time _0 Orientation to Place _1 Registration _2 Attention/ Calculation 0 0 3  Recall  0 0 2  Language- name 2 objects _3 Language- repeat _4 Language- follow 3 step command _5 Language- read & follow direction _6 Write a sentence 0 1 1  Copy design 0 0 1  Total score _7 Generalized: Well developed, in no acute distress   Neurological examination  Mentation: Alert oriented to time, place, history taking. Follows all commands speech and language fluent Cranial nerve II-XII: Pupils were equal round reactive to light. Extraocular movements were full, visual field were full on confrontational test. Facial sensation and strength were normal. Uvula tongue midline. Head turning and shoulder shrug  were normal and symmetric. Motor: The motor testing reveals 5 over 5 strength of all 4 extremities. Good symmetric motor tone is noted throughout.  Sensory: Sensory testing is intact to soft touch on all 4 extremities. No evidence of extinction is noted.   Coordination: Cerebellar testing reveals good finger-nose-finger and heel-to-shin bilaterally.  Gait and station: Patient uses a Rollator when ambulating. Reflexes: Deep tendon reflexes are symmetric and normal bilaterally.   DIAGNOSTIC DATA (LABS, IMAGING, TESTING) - I reviewed patient records, labs, notes, testing and imaging myself where available.  Lab Results  Component Value Date   WBC 4.8 05/20/2017   HGB 13.4 11/04/2017   HCT 32.4 (L) 05/20/2017   MCV 84.7 05/20/2017   PLT 324.0 05/20/2017      Component Value Date/Time   NA 137 11/04/2017   NA 139 03/22/2013 1015   K 4.0 11/04/2017   K  3.7 03/22/2013 1015   CL 101 05/19/2017 1601   CL 101 03/22/2013 1015   CO2 25 05/19/2017 1601   CO2 26 03/22/2013 1015   GLUCOSE 140 (H) 05/19/2017 1601   GLUCOSE 176 (H) 03/26/2017 1225   GLUCOSE 197 (H) 03/22/2013 1015   BUN 23 (A) 11/04/2017   BUN 23.2 03/22/2013 1015   CREATININE 1.8 (A) 11/04/2017   CREATININE 1.85 (H) 05/19/2017 1601   CREATININE 1.60 (H) 04/23/2016 1657   CREATININE 1.3 (H)  03/22/2013 1015   CALCIUM 9.6 05/19/2017 1601   CALCIUM 9.8 03/22/2013 1015   PROT 7.1 05/19/2017 1601   PROT 7.6 03/22/2013 1015   ALBUMIN 4.1 05/19/2017 1601   ALBUMIN 3.5 03/22/2013 1015   AST 18 05/19/2017 1601   AST 16 03/22/2013 1015   ALT 9 05/19/2017 1601   ALT 12 03/22/2013 1015   ALKPHOS 105 05/19/2017 1601   ALKPHOS 102 03/22/2013 1015   BILITOT 0.4 05/19/2017 1601   BILITOT 0.60 03/22/2013 1015   GFRNONAA 25 (L) 05/19/2017 1601   GFRNONAA 34 (L) 11/30/2011 0748   GFRAA 28 (L) 05/19/2017 1601   GFRAA 41 (L) 11/30/2011 0748    Lab Results  Component Value Date   HGBA1C 6.3 (H) 10/14/2016   Lab Results  Component Value Date   CEYEMVVK12 244 03/18/2017   Lab Results  Component Value Date   TSH 1.94 05/20/2017      ASSESSMENT AND PLAN 84 y.o. year old female  has a past medical history of Acute gastric ulcer without mention of hemorrhage, perforation, or obstruction (2006), Anxiety, Atrial fibrillation ([place]), Breast cancer ([place]), Carotid artery disease ([place]), Chronic diastolic heart failure ([place]), Chronic lower back pain, Diabetic peripheral neuropathy ([place]) (03/09/2015), Diverticulosis, Esophagitis, unspecified (2006), Hiatal hernia (2006), History of nuclear stress test, adenomatous colonic polyps, Hypertension, Mediastinal lymphadenopathy, Memory difficulty (11/14/2016), Mitral regurgitation, Osteopenia, Presence of permanent cardiac pacemaker, Pulmonary embolus ([place]) (2006), Pulmonary HTN ([place]), Tachy-brady syndrome ([place]), Thyroid nodule, Type II diabetes mellitus ([place]), and Venous insufficiency. here with:  1.  Memory disturbance  Patient's memory score has declined slightly.  She will increase Namenda to 5 mg twice a day.  For now we will not increase Namenda greater than 10 mg/day d/t kidney function.  We will continue to monitor her memory.  If her symptoms worsen or she develops new symptoms she should let us know.  She will follow-up in 6 months or sooner if  needed.  I spent 15 minutes with the patient. 50% of this time was spent reviewing memory score  Ward Givens, MSN, NP-C 11/25/2017, 3:10 PM [facility] [address] [place], [place] [address] [phone]

## 2017-11-25 NOTE — Patient Instructions (Signed)
Your Plan:  Take Namenda 5 mg in the morning and 5 mg at bedtime Continue to monitor memory If your symptoms worsen or you develop new symptoms please let us know.    Thank you for coming to see [place] at [facility]. I hope we have been able to provide you high quality care today.  You may receive a patient satisfaction survey over the next few weeks. We would appreciate your feedback and comments so that we may continue to improve ourselves and the health of our patients.

## 2017-11-26 ENCOUNTER — Telehealth: Payer: Self-pay | Admitting: Neurology

## 2017-11-26 DIAGNOSIS — I6522 Occlusion and stenosis of left carotid artery: Secondary | ICD-10-CM

## 2017-11-26 NOTE — Telephone Encounter (Signed)
I called the patient and talk with the son.  The patient had a carotid Doppler study done about 1 year ago showing partial blockage of the left internal carotid artery.  We will get a follow-up carotid Doppler study.  The patient remains on Coumadin.

## 2017-11-28 ENCOUNTER — Ambulatory Visit (INDEPENDENT_AMBULATORY_CARE_PROVIDER_SITE_OTHER): Payer: Medicare HMO | Admitting: *Deleted

## 2017-11-28 DIAGNOSIS — Z86711 Personal history of pulmonary embolism: Secondary | ICD-10-CM | POA: Diagnosis not present

## 2017-11-28 DIAGNOSIS — Z5181 Encounter for therapeutic drug level monitoring: Secondary | ICD-10-CM | POA: Diagnosis not present

## 2017-11-28 LAB — POCT INR: INR: 2.4

## 2017-11-28 NOTE — Patient Instructions (Signed)
Description   Continue  taking 1 tablet (2.5mg ) daily except 1/2 tablet Tuesdays, Thursdays, and Saturdays.  Recheck INR in 3 weeks. Call if needed any new medications or if scheduled for any procedures  any concerns  [phone]

## 2017-12-01 ENCOUNTER — Encounter (HOSPITAL_COMMUNITY): Payer: Medicare HMO

## 2017-12-05 ENCOUNTER — Ambulatory Visit (HOSPITAL_COMMUNITY)
Admission: RE | Admit: 2017-12-05 | Discharge: 2017-12-05 | Disposition: A | Discharge status: Home / Self Care | Payer: Medicare HMO | Source: Ambulatory Visit | Attending: Neurology | Admitting: Neurology

## 2017-12-05 ENCOUNTER — Encounter: Payer: Self-pay | Admitting: Internal Medicine

## 2017-12-05 DIAGNOSIS — I6523 Occlusion and stenosis of bilateral carotid arteries: Secondary | ICD-10-CM | POA: Diagnosis not present

## 2017-12-05 DIAGNOSIS — I6522 Occlusion and stenosis of left carotid artery: Secondary | ICD-10-CM

## 2017-12-05 NOTE — Progress Notes (Signed)
Bilateral carotid duplex completed. 1% to 39 % ICA stenosis. There is acoustic shadowing in the left mid ICA which may obscure higher velocities. [facility], [place] 12/04/2017 5:14 PM

## 2017-12-08 ENCOUNTER — Telehealth: Payer: Self-pay | Admitting: Neurology

## 2017-12-08 DIAGNOSIS — B351 Tinea unguium: Secondary | ICD-10-CM | POA: Diagnosis not present

## 2017-12-08 DIAGNOSIS — E1142 Type 2 diabetes mellitus with diabetic polyneuropathy: Secondary | ICD-10-CM | POA: Diagnosis not present

## 2017-12-08 DIAGNOSIS — L851 Acquired keratosis [keratoderma] palmaris et plantaris: Secondary | ICD-10-CM | POA: Diagnosis not present

## 2017-12-08 NOTE — Telephone Encounter (Signed)
The carotid Doppler study done 1 year ago showed left internal carotid artery stenosis of 50-69%, no such abnormalities are seen on this evaluation.  I contacted the daughter concerning this.   Carotid doppler 12/08/17:  Final Interpretation: Right Carotid: Velocities in the right ICA are consistent with a 1-39% stenosis.  Left Carotid: Velocities in the left ICA are consistent with a 1-39% stenosis.       Acoustic shadowing may obscure higher velocities in the mid ICA.  Vertebrals: Both vertebral arteries were patent with antegrade flow. Subclavians: Normal flow hemodynamics were seen in bilateral subclavian       arteries.

## 2017-12-10 MED ORDER — GABAPENTIN 300 MG PO CAPS: 300.0000 mg | ORAL_CAPSULE | Freq: Two times a day (BID) | ORAL | 3 refills | Status: DC

## 2017-12-19 ENCOUNTER — Ambulatory Visit (INDEPENDENT_AMBULATORY_CARE_PROVIDER_SITE_OTHER): Payer: Medicare HMO | Admitting: *Deleted

## 2017-12-19 DIAGNOSIS — Z86711 Personal history of pulmonary embolism: Secondary | ICD-10-CM

## 2017-12-19 DIAGNOSIS — Z5181 Encounter for therapeutic drug level monitoring: Secondary | ICD-10-CM | POA: Diagnosis not present

## 2017-12-19 LAB — POCT INR: INR: 1.9

## 2017-12-19 NOTE — Patient Instructions (Signed)
Description   Today take an extra 1/2 tablet, then Continue  taking 1 tablet (2.5mg ) daily except 1/2 tablet Tuesdays, Thursdays, and Saturdays.  Recheck INR in 3 weeks. Call if needed any new medications or if scheduled for any procedures  any concerns  [phone]

## 2018-01-06 ENCOUNTER — Other Ambulatory Visit: Payer: Self-pay | Admitting: Internal Medicine

## 2018-01-06 NOTE — Telephone Encounter (Signed)
Control database checked last refill:12/12/2017  LOV: 11/17/2017

## 2018-01-08 ENCOUNTER — Other Ambulatory Visit: Payer: Self-pay | Admitting: Cardiology

## 2018-01-09 ENCOUNTER — Other Ambulatory Visit: Payer: Self-pay | Admitting: Internal Medicine

## 2018-01-09 ENCOUNTER — Ambulatory Visit (INDEPENDENT_AMBULATORY_CARE_PROVIDER_SITE_OTHER): Payer: Medicare HMO | Admitting: *Deleted

## 2018-01-09 DIAGNOSIS — Z86711 Personal history of pulmonary embolism: Secondary | ICD-10-CM | POA: Diagnosis not present

## 2018-01-09 DIAGNOSIS — Z5181 Encounter for therapeutic drug level monitoring: Secondary | ICD-10-CM | POA: Diagnosis not present

## 2018-01-09 LAB — POCT INR: INR: 2.5

## 2018-01-09 NOTE — Patient Instructions (Signed)
Description   Continue  taking 1 tablet (2.5mg ) daily except 1/2 tablet Tuesdays, Thursdays, and Saturdays.  Recheck INR in 4 weeks. Call if needed any new medications or if scheduled for any procedures  any concerns  [phone]

## 2018-01-12 ENCOUNTER — Other Ambulatory Visit: Payer: Self-pay | Admitting: Cardiology

## 2018-01-13 ENCOUNTER — Encounter: Payer: Self-pay | Admitting: Internal Medicine

## 2018-01-13 NOTE — Telephone Encounter (Signed)
Control database checked last refill: 12/12/2017 LOV: 11/17/2017

## 2018-01-14 ENCOUNTER — Emergency Department (HOSPITAL_COMMUNITY): Payer: Medicare HMO

## 2018-01-14 ENCOUNTER — Other Ambulatory Visit: Payer: Self-pay

## 2018-01-14 ENCOUNTER — Encounter (HOSPITAL_COMMUNITY): Payer: Self-pay

## 2018-01-14 ENCOUNTER — Emergency Department (HOSPITAL_COMMUNITY)
Admission: EM | Admit: 2018-01-14 | Discharge: 2018-01-14 | Disposition: A | Discharge status: Home / Self Care | Payer: Medicare HMO | Attending: Emergency Medicine | Admitting: Emergency Medicine

## 2018-01-14 DIAGNOSIS — Z853 Personal history of malignant neoplasm of breast: Secondary | ICD-10-CM | POA: Insufficient documentation

## 2018-01-14 DIAGNOSIS — N390 Urinary tract infection, site not specified: Secondary | ICD-10-CM | POA: Diagnosis not present

## 2018-01-14 DIAGNOSIS — I5032 Chronic diastolic (congestive) heart failure: Secondary | ICD-10-CM | POA: Insufficient documentation

## 2018-01-14 DIAGNOSIS — I10 Essential (primary) hypertension: Secondary | ICD-10-CM | POA: Diagnosis not present

## 2018-01-14 DIAGNOSIS — R531 Weakness: Secondary | ICD-10-CM | POA: Diagnosis not present

## 2018-01-14 DIAGNOSIS — I130 Hypertensive heart and chronic kidney disease with heart failure and stage 1 through stage 4 chronic kidney disease, or unspecified chronic kidney disease: Secondary | ICD-10-CM | POA: Diagnosis not present

## 2018-01-14 DIAGNOSIS — R030 Elevated blood-pressure reading, without diagnosis of hypertension: Secondary | ICD-10-CM | POA: Diagnosis not present

## 2018-01-14 DIAGNOSIS — Z7901 Long term (current) use of anticoagulants: Secondary | ICD-10-CM | POA: Insufficient documentation

## 2018-01-14 DIAGNOSIS — M6281 Muscle weakness (generalized): Secondary | ICD-10-CM | POA: Diagnosis present

## 2018-01-14 DIAGNOSIS — I2510 Atherosclerotic heart disease of native coronary artery without angina pectoris: Secondary | ICD-10-CM | POA: Diagnosis not present

## 2018-01-14 DIAGNOSIS — Z950 Presence of cardiac pacemaker: Secondary | ICD-10-CM | POA: Insufficient documentation

## 2018-01-14 DIAGNOSIS — Z79899 Other long term (current) drug therapy: Secondary | ICD-10-CM | POA: Diagnosis not present

## 2018-01-14 DIAGNOSIS — R0682 Tachypnea, not elsewhere classified: Secondary | ICD-10-CM | POA: Diagnosis not present

## 2018-01-14 DIAGNOSIS — E1140 Type 2 diabetes mellitus with diabetic neuropathy, unspecified: Secondary | ICD-10-CM | POA: Insufficient documentation

## 2018-01-14 DIAGNOSIS — Z87891 Personal history of nicotine dependence: Secondary | ICD-10-CM | POA: Diagnosis not present

## 2018-01-14 DIAGNOSIS — N184 Chronic kidney disease, stage 4 (severe): Secondary | ICD-10-CM | POA: Diagnosis not present

## 2018-01-14 DIAGNOSIS — L03116 Cellulitis of left lower limb: Secondary | ICD-10-CM

## 2018-01-14 LAB — URINALYSIS, ROUTINE W REFLEX MICROSCOPIC
BILIRUBIN URINE: NEGATIVE
Glucose, UA: NEGATIVE mg/dL
Hgb urine dipstick: NEGATIVE
Ketones, ur: NEGATIVE mg/dL
Nitrite: NEGATIVE
PH: 6 (ref 5.0–8.0)
Protein, ur: NEGATIVE mg/dL
SPECIFIC GRAVITY, URINE: 1.01 (ref 1.005–1.030)

## 2018-01-14 LAB — PROTIME-INR
INR: 2.18
PROTHROMBIN TIME: 24 s — AB (ref 11.4–15.2)

## 2018-01-14 LAB — BASIC METABOLIC PANEL
Anion gap: 13 (ref 5–15)
BUN: 17 mg/dL (ref 6–20)
CALCIUM: 9.2 mg/dL (ref 8.9–10.3)
CHLORIDE: 102 mmol/L (ref 101–111)
CO2: 24 mmol/L (ref 22–32)
CREATININE: 1.63 mg/dL — AB (ref 0.44–1.00)
GFR, EST AFRICAN AMERICAN: 32 mL/min — AB (ref >60)
GFR, EST NON AFRICAN AMERICAN: 28 mL/min — AB (ref >60)
Glucose, Bld: 158 mg/dL — ABNORMAL HIGH (ref 65–99)
Potassium: 3.7 mmol/L (ref 3.5–5.1)
SODIUM: 139 mmol/L (ref 135–145)

## 2018-01-14 LAB — I-STAT TROPONIN, ED: TROPONIN I, POC: 0 ng/mL (ref 0.00–0.08)

## 2018-01-14 LAB — CBC
HCT: 43.5 % (ref 36.0–46.0)
Hemoglobin: 14.1 g/dL (ref 12.0–15.0)
MCH: 30.3 pg (ref 26.0–34.0)
MCHC: 32.4 g/dL (ref 30.0–36.0)
MCV: 93.5 fL (ref 78.0–100.0)
PLATELETS: 261 10*3/uL (ref 150–400)
RBC: 4.65 MIL/uL (ref 3.87–5.11)
RDW: 14.2 % (ref 11.5–15.5)
WBC: 3.5 10*3/uL — AB (ref 4.0–10.5)

## 2018-01-14 MED ORDER — DOXYCYCLINE HYCLATE 100 MG PO TABS
100.0000 mg | ORAL_TABLET | Freq: Once | ORAL | Status: AC
  Administered 2018-01-14: 100 mg via ORAL
  Filled 2018-01-14: qty 1

## 2018-01-14 MED ORDER — SULFAMETHOXAZOLE-TRIMETHOPRIM 800-160 MG PO TABS: 1.0000 | ORAL_TABLET | Freq: Two times a day (BID) | ORAL | 0 refills | Status: DC

## 2018-01-14 NOTE — ED Triage Notes (Signed)
EMS reports at 4 am this morning pt c/o hands and feet hurting pt endorses neuropathy and states that this feels similar per EMS. being slow to get around with not feeling right, denies CP or SOB. Tachypnea with EMS paced and A FIB no falls, swelling to legs has CHF takes lasix. CBG 168. 142/90 BP. Left arm restrictions r/t mastectomy.

## 2018-01-14 NOTE — Discharge Instructions (Addendum)
You have an infection in your left foot.  Soak in warm salt water.  Simple Band-Aid.  Prescription for antibiotic for foot and urine.  Follow-up with your podiatrist.

## 2018-01-14 NOTE — ED Triage Notes (Signed)
Family at bedside reports pt has been "SOB and sometimes acts like this when she has UTI's" Daughter is also "concerned about place on foot that doesn't look good".

## 2018-01-14 NOTE — ED Provider Notes (Signed)
[facility] EMERGENCY DEPARTMENT Provider Note   CSN: [id number] Arrival date & time: 01/14/18  3662     History   Chief Complaint Chief Complaint  Patient presents with  . Weakness    HPI [name] [name] is a 84 y.o. female.  Patient presents with generalized malaise, concern about an ulcer on her left foot, ? dysuria, dyspnea (which is chronic).  No substernal chest pain, fever, sweats, chills.  She is ambulatory without gross neurological deficits.  She has a known history of peripheral neuropathy for which she takes gabapentin.  Severity of symptoms is mild to moderate.  Nothing makes symptoms better or worse.     Past Medical History:  Diagnosis Date  . Acute gastric ulcer without mention of hemorrhage, perforation, or obstruction 2006   EGD  . Anxiety   . Atrial fibrillation (HCC)    flecainide; coumadin  . Breast cancer ([place])    left  . Carotid artery disease ([place])    94/76: RICA 5-46%, LICA 50-35%  //  Carotiud [place] 46/56: RICA 8-12%; LICA 75-17% >> FU 1 year   . Chronic diastolic heart failure (HCC)    a. echo 10/10: mild LVH, EF 55-60%, mild AI, mild MR, mild to mod LAE, mild RVE, severe RAE, mod TR, PASP 53  /  b.  Echo 3/14:  Mild LVH, EF 55%, Tr AI, MAC, mild MR, mod LAE, PASP 31, trivial eff. // c. echo 1/17: EF 55-60%, mild AI, MAC, mild MR, moderate BAE, moderate TR  . Chronic lower back pain   . Diabetic peripheral neuropathy ([place]) 03/09/2015  . Diverticulosis   . Esophagitis, unspecified 2006   EGD  . Hiatal hernia 2006   EGD  . History of nuclear stress test    a. Myoview 2/12: EF 69%, no scar or ischemia  . Hx of adenomatous colonic polyps   . Hypertension   . Mediastinal lymphadenopathy   . Memory difficulty 11/14/2016  . Mitral regurgitation    mild, echo, October, 2010  . Osteopenia   . Presence of permanent cardiac pacemaker   . Pulmonary embolus [facility]) 2006   2006, with DVT  . Pulmonary HTN ([place])    53 mmHg echo, 2010,  moderate TR, mild right ventricular enlargement  . Tachy-brady syndrome ([place])    s/p pacer 07/2009  . Thyroid nodule    non-neoplastic goiter  . Type II diabetes mellitus ([place])   . Venous insufficiency    Chronic    Patient Active Problem List   Diagnosis Date Noted  . Morbid obesity due to excess calories ([place]) complicated by hbp 00/17/4944  . Solitary pulmonary nodule on lung CT 03/21/2017  . Amiodarone toxicity 03/21/2017  . Pacemaker complications 96/75/9163  . CKD (chronic kidney disease), stage IV ([place]) 03/14/2017  . Recurrent falls 01/13/2017  . Cognitive change   . A-fib ([place]) 10/14/2016  . Chronic back pain 07/28/2016  . Diabetes mellitus type 2, noninsulin dependent ([place])   . CRD (chronic renal disease), stage III ([place]) 04/12/2016  . Dyspnea on exertion 04/12/2016  . Hypertensive heart disease 03/28/2016  . Anemia, iron deficiency 10/22/2015  . Diabetic peripheral neuropathy ([place]) 03/09/2015  . Acute on chronic diastolic (congestive) heart failure ([place]) 09/07/2014  . Sick sinus syndrome ([place]) 03/09/2014  . Hypertension 06/21/2013  . Osteopenia   . Pulmonary HTN ([place])   . History of pulmonary embolism   . Carotid artery disease ([place])   . History of breast cancer   .  Pacemaker-Medtronic   . Syncope   . Mitral regurgitation   . Mediastinal lymphadenopathy   . THYROID NODULE, HX OF 07/19/2009    Past Surgical History:  Procedure Laterality Date  . ABDOMINAL HYSTERECTOMY    . ANKLE FRACTURE SURGERY Right   . BACK SURGERY    . BREAST LUMPECTOMY Left   . FRACTURE SURGERY    . INSERT / REPLACE / REMOVE PACEMAKER  06/30/2009   MDT Adalpta L implanted by Dr Olevia Perches  . LUMBAR LAMINECTOMY  1973, 06/22/2011   "no hardware either time"     OB History   None      Home Medications    Prior to Admission medications   Medication Sig Start Date End Date Taking? Authorizing Provider  acetaminophen (TYLENOL) 325 MG tablet Take 650 mg by mouth every 6 (six) hours as  needed for mild pain.   Yes [provider]  ALPRAZolam Duanne Moron) 0.25 MG tablet TAKE 1 TABLET TWICE A DAY AS NEEDED FOR ANXIETY 01/13/18  Yes Hoyt Koch, MD  bethanechol (URECHOLINE) 25 MG tablet Take 25 mg by mouth 2 (two) times daily.   Yes [provider]  blood glucose meter kit and supplies KIT Dispense based on patient and insurance preference. Use before breakfast and at bedtime. Diagnosis code: E11.9 03/26/17  Yes Nche, Charlene Brooke, NP  furosemide (LASIX) 80 MG tablet TAKE 1 TABLET (80 MG TOTAL) BY MOUTH 2 (TWO) TIMES DAILY. 01/13/18  Yes Isaiah Serge, NP  gabapentin (NEURONTIN) 300 MG capsule Take 1 capsule (300 mg total) by mouth 2 (two) times daily. 12/10/17  Yes Hoyt Koch, MD  memantine (NAMENDA) 5 MG tablet Take 1 tablet (5 mg total) by mouth 2 (two) times daily. 06/23/17  Yes Ward Givens, NP  metoprolol succinate (TOPROL-XL) 25 MG 24 hr tablet Take 1 tablet (25 mg total) by mouth daily. 04/04/17  Yes Baldwin Jamaica, PA-C  phenazopyridine (PYRIDIUM) 100 MG tablet Take 1 tablet (100 mg total) by mouth 3 (three) times daily as needed for pain (with food). 07/25/17  Yes Nche, Charlene Brooke, NP  potassium chloride 20 MEQ/15ML (10%) SOLN Take 20 mEq by mouth daily as needed (if have not eaten a banana).   Yes [provider]  promethazine (PHENERGAN) 25 MG tablet Take 1 tablet (25 mg total) by mouth every 8 (eight) hours as needed for nausea or vomiting. 10/22/16  Yes Hoyt Koch, MD  pyridoxine (B-6) 200 MG tablet Take 200 mg by mouth daily.   Yes [provider]  vitamin B-12 (CYANOCOBALAMIN) 500 MCG tablet Take 500 mcg by mouth daily.    Yes [provider]  warfarin (COUMADIN) 2.5 MG tablet TAKE 1/2 TABLET TO 1 TABLET DAILY OR AS DIRECTED BY COUMADIN CLINIC Patient taking differently: 1.57m by mouth Tues, Thurs, Sat, 2.549mMon, Wed, Fri, Sun 01/08/18  Yes NeDorothy SparkMD  sulfamethoxazole-trimethoprim  (BACTRIM DS,SEPTRA DS) 800-160 MG tablet Take 1 tablet by mouth 2 (two) times daily for 7 days. 01/14/18 01/21/18  CoNat ChristenMD    Family History Family History  Problem Relation Age of Onset  . Heart disease Mother   . Hypertension Mother   . COPD Father   . Hypertension Father   . Hypertension Sister   . Hypertension Brother   . Hypertension Daughter   . Colon cancer Neg Hx   . Heart attack Neg Hx   . Stroke Neg Hx     Social History Social  History   Tobacco Use  . Smoking status: Former Smoker    Packs/day: 1.00    Years: 47.00    Pack years: 47.00    Types: Cigarettes    Last attempt to quit: 10/26/1998    Years since quitting: 19.2  . Smokeless tobacco: Never Used  Substance Use Topics  . Alcohol use: No    Comment: 10/14/2016 "I'll have a beer q once in awhile"  . Drug use: No     Allergies   Amiodarone; Flecainide; Morphine and related; and Cephalexin   Review of Systems Review of Systems  All other systems reviewed and are negative.    Physical Exam Updated Vital Signs BP (!) 145/107 (BP Location: Right Arm)   Pulse 90   Temp 98.2 F (36.8 C) (Oral)   Resp 16   Ht 6' (1.829 m)   Wt 109.8 kg (242 lb)   SpO2 100%   BMI 32.82 kg/m   Physical Exam  Constitutional: She is oriented to person, place, and time. She appears well-developed and well-nourished.  Alert, pleasant, no acute distress  HENT:  Head: Normocephalic and atraumatic.  Eyes: Conjunctivae are normal.  Neck: Neck supple.  Cardiovascular: Normal rate and regular rhythm.  Pulmonary/Chest: Effort normal and breath sounds normal.  Abdominal: Soft. Bowel sounds are normal.  Musculoskeletal: Normal range of motion.  Neurological: She is alert and oriented to person, place, and time.  Skin:  Left foot: Area of ulceration and cellulitis approximately 2-3 cm in diameter on the dorsum at the first MTP joint  Psychiatric: She has a normal mood and affect. Her behavior is normal.  Nursing  note and vitals reviewed.    ED Treatments / Results  Labs (all labs ordered are listed, but only abnormal results are displayed) Labs Reviewed  BASIC METABOLIC PANEL - Abnormal; Notable for the following components:      Result Value   Glucose, Bld 158 (*)    Creatinine, Ser 1.63 (*)    GFR calc non Af Amer 28 (*)    GFR calc Af Amer 32 (*)    All other components within normal limits  CBC - Abnormal; Notable for the following components:   WBC 3.5 (*)    All other components within normal limits  PROTIME-INR - Abnormal; Notable for the following components:   Prothrombin Time 24.0 (*)    All other components within normal limits  URINALYSIS, ROUTINE W REFLEX MICROSCOPIC - Abnormal; Notable for the following components:   APPearance HAZY (*)    Leukocytes, UA LARGE (*)    Bacteria, UA FEW (*)    Squamous Epithelial / LPF 0-5 (*)    All other components within normal limits  I-STAT TROPONIN, ED    EKG EKG Interpretation  Date/Time:  Wednesday January 14 2018 10:04:34 EDT Ventricular Rate:  98 PR Interval:    QRS Duration: 78 QT Interval:  358 QTC Calculation: 457 R Axis:   -14 Text Interpretation:  Atrial fibrillation Abnormal ECG Confirmed by Nat Christen [phone]) on 01/14/2018 12:28:50 PM   Radiology Dg Chest 2 View  Result Date: 01/14/2018 CLINICAL DATA:  Onset of hand and foot numbness at 4 a.m. this morning. Tachypnea. EXAM: CHEST - 2 VIEW COMPARISON:  CT chest 09/19/2017.  PA and lateral chest 08/07/2017. FINDINGS: Mild left basilar atelectasis is seen. The lungs are otherwise clear. There is cardiomegaly without pulmonary edema. Pacing device in place. Hiatal hernia is identified. No pneumothorax or pleural effusion. Remote right rib  fractures noted. IMPRESSION: Cardiomegaly without acute disease. Hiatal hernia. Atherosclerosis. Electronically Signed   By: Inge Rise M.D.   On: 01/14/2018 10:38    Procedures Procedures (including critical care  time)  Medications Ordered in ED Medications  doxycycline (VIBRA-TABS) tablet 100 mg (has no administration in time range)     Initial Impression / Assessment and Plan / ED Course  I have reviewed the triage vital signs and the nursing notes.  Pertinent labs & imaging results that were available during my care of the patient were reviewed by me and considered in my medical decision making (see chart for details).     Patient presents with malaise, cellulitis of her left foot, questionable urinary symptoms.  Clinically, she appears to have a early cellulitis of the left foot.  Additionally, the urine is minimally infected.  Will Rx Septra DS in an attempt to use one antibiotic for 2 different etiologies.  Her creatinine is stable.  Glucose minimally elevated.  Discharge medications Septra DS.  Final Clinical Impressions(s) / ED Diagnoses   Final diagnoses:  Cellulitis of left foot  Urinary tract infection without hematuria, site unspecified    ED Discharge Orders        Ordered    sulfamethoxazole-trimethoprim (BACTRIM DS,SEPTRA DS) 800-160 MG tablet  2 times daily     01/14/18 1637       Nat Christen, MD 01/14/18 1646

## 2018-01-16 ENCOUNTER — Ambulatory Visit (INDEPENDENT_AMBULATORY_CARE_PROVIDER_SITE_OTHER): Payer: Medicare HMO | Admitting: Pharmacist

## 2018-01-16 DIAGNOSIS — Z5181 Encounter for therapeutic drug level monitoring: Secondary | ICD-10-CM

## 2018-01-16 DIAGNOSIS — Z86711 Personal history of pulmonary embolism: Secondary | ICD-10-CM | POA: Diagnosis not present

## 2018-01-16 LAB — POCT INR: INR: 2.2

## 2018-01-16 NOTE — Patient Instructions (Addendum)
Description   Continue taking normal dose of 1 tablet (2.5mg ) daily except 1/2 tablet Tuesdays, Thursdays, and Saturdays.  Recheck INR in 4 weeks. Call if needed any new medications or if scheduled for any procedures  any concerns  [phone]

## 2018-01-20 ENCOUNTER — Other Ambulatory Visit: Payer: Self-pay | Admitting: Adult Health

## 2018-01-20 ENCOUNTER — Ambulatory Visit: Payer: Medicare HMO | Admitting: Internal Medicine

## 2018-01-21 ENCOUNTER — Encounter: Payer: Self-pay | Admitting: Family

## 2018-01-21 ENCOUNTER — Ambulatory Visit (INDEPENDENT_AMBULATORY_CARE_PROVIDER_SITE_OTHER): Payer: Medicare HMO | Admitting: Family

## 2018-01-21 ENCOUNTER — Other Ambulatory Visit: Payer: Medicare HMO

## 2018-01-21 VITALS — BP 126/80 | HR 72 | Temp 97.7°F | Ht 72.0 in | Wt 246.1 lb

## 2018-01-21 DIAGNOSIS — R300 Dysuria: Secondary | ICD-10-CM | POA: Diagnosis not present

## 2018-01-21 DIAGNOSIS — G629 Polyneuropathy, unspecified: Secondary | ICD-10-CM | POA: Diagnosis not present

## 2018-01-21 LAB — POC URINALSYSI DIPSTICK (AUTOMATED)
Bilirubin, UA: NEGATIVE
Blood, UA: NEGATIVE
GLUCOSE UA: NEGATIVE
Ketones, UA: NEGATIVE
NITRITE UA: NEGATIVE
Protein, UA: NEGATIVE
Spec Grav, UA: 1.015 (ref 1.010–1.025)
Urobilinogen, UA: 0.2 E.U./dL
pH, UA: 6 (ref 5.0–8.0)

## 2018-01-21 MED ORDER — AMOXICILLIN-POT CLAVULANATE 875-125 MG PO TABS: 1.0000 | ORAL_TABLET | Freq: Two times a day (BID) | ORAL | 0 refills | Status: DC

## 2018-01-21 NOTE — Progress Notes (Signed)
[name] [name] is a 84 y.o. female with the following history as recorded in EpicCare:  Patient Active Problem List   Diagnosis Date Noted  . Morbid obesity due to excess calories ([place]) complicated by hbp 81/82/9937  . Solitary pulmonary nodule on lung CT 03/21/2017  . Amiodarone toxicity 03/21/2017  . Pacemaker complications 16/96/7893  . CKD (chronic kidney disease), stage IV ([place]) 03/14/2017  . Recurrent falls 01/13/2017  . Cognitive change   . A-fib ([place]) 10/14/2016  . Chronic back pain 07/28/2016  . Diabetes mellitus type 2, noninsulin dependent ([place])   . CRD (chronic renal disease), stage III ([place]) 04/12/2016  . Dyspnea on exertion 04/12/2016  . Hypertensive heart disease 03/28/2016  . Anemia, iron deficiency 10/22/2015  . Diabetic peripheral neuropathy ([place]) 03/09/2015  . Acute on chronic diastolic (congestive) heart failure ([place]) 09/07/2014  . Sick sinus syndrome ([place]) 03/09/2014  . Hypertension 06/21/2013  . Osteopenia   . Pulmonary HTN ([place])   . History of pulmonary embolism   . Carotid artery disease ([place])   . History of breast cancer   . Pacemaker-Medtronic   . Syncope   . Mitral regurgitation   . Mediastinal lymphadenopathy   . THYROID NODULE, HX OF 07/19/2009    Current Outpatient Medications  Medication Sig Dispense Refill  . acetaminophen (TYLENOL) 325 MG tablet Take 650 mg by mouth every 6 (six) hours as needed for mild pain.    Marland Kitchen ALPRAZolam (XANAX) 0.25 MG tablet TAKE 1 TABLET TWICE A DAY AS NEEDED FOR ANXIETY 20 tablet 1  . amoxicillin-clavulanate (AUGMENTIN) 875-125 MG tablet Take 1 tablet by mouth 2 (two) times daily. 14 tablet 0  . bethanechol (URECHOLINE) 25 MG tablet Take 25 mg by mouth 2 (two) times daily.    . blood glucose meter kit and supplies KIT Dispense based on patient and insurance preference. Use before breakfast and at bedtime. Diagnosis code: E11.9 1 each 2  . furosemide (LASIX) 80 MG tablet TAKE 1 TABLET (80 MG TOTAL) BY MOUTH 2 (TWO)  TIMES DAILY. 180 tablet 3  . gabapentin (NEURONTIN) 300 MG capsule Take 1 capsule (300 mg total) by mouth 2 (two) times daily. 60 capsule 3  . memantine (NAMENDA) 5 MG tablet TAKE 1 TABLET BY MOUTH TWICE A DAY 180 tablet 1  . metoprolol succinate (TOPROL-XL) 25 MG 24 hr tablet Take 1 tablet (25 mg total) by mouth daily. 30 tablet 3  . phenazopyridine (PYRIDIUM) 100 MG tablet Take 1 tablet (100 mg total) by mouth 3 (three) times daily as needed for pain (with food). 10 tablet 0  . potassium chloride 20 MEQ/15ML (10%) SOLN Take 20 mEq by mouth daily as needed (if have not eaten a banana).    . promethazine (PHENERGAN) 25 MG tablet Take 1 tablet (25 mg total) by mouth every 8 (eight) hours as needed for nausea or vomiting. 20 tablet 0  . pyridoxine (B-6) 200 MG tablet Take 200 mg by mouth daily.    . vitamin B-12 (CYANOCOBALAMIN) 500 MCG tablet Take 500 mcg by mouth daily.     Marland Kitchen warfarin (COUMADIN) 2.5 MG tablet TAKE 1/2 TABLET TO 1 TABLET DAILY OR AS DIRECTED BY COUMADIN CLINIC (Patient taking differently: 1.50m by mouth Tues, Thurs, Sat, 2.5101mMon, Wed, Fri, Sun) 90 tablet 1   No current facility-administered medications for this visit.     Allergies: Amiodarone; Flecainide; Morphine and related; Bactrim ds [sulfamethoxazole-trimethoprim]; and Cephalexin  Past Medical History:  Diagnosis Date  . Acute gastric ulcer  without mention of hemorrhage, perforation, or obstruction 2006   EGD  . Anxiety   . Atrial fibrillation (HCC)    flecainide; coumadin  . Breast cancer ([place])    left  . Carotid artery disease ([place])    01/60: RICA 1-09%, LICA 32-35%  //  Carotiud [place] 57/32: RICA 2-02%; LICA 54-27% >> FU 1 year   . Chronic diastolic heart failure (HCC)    a. echo 10/10: mild LVH, EF 55-60%, mild AI, mild MR, mild to mod LAE, mild RVE, severe RAE, mod TR, PASP 53  /  b.  Echo 3/14:  Mild LVH, EF 55%, Tr AI, MAC, mild MR, mod LAE, PASP 31, trivial eff. // c. echo 1/17: EF 55-60%, mild AI, MAC, mild  MR, moderate BAE, moderate TR  . Chronic lower back pain   . Diabetic peripheral neuropathy ([place]) 03/09/2015  . Diverticulosis   . Esophagitis, unspecified 2006   EGD  . Hiatal hernia 2006   EGD  . History of nuclear stress test    a. Myoview 2/12: EF 69%, no scar or ischemia  . Hx of adenomatous colonic polyps   . Hypertension   . Mediastinal lymphadenopathy   . Memory difficulty 11/14/2016  . Mitral regurgitation    mild, echo, October, 2010  . Osteopenia   . Presence of permanent cardiac pacemaker   . Pulmonary embolus [facility]) 2006   2006, with DVT  . Pulmonary HTN ([place])    53 mmHg echo, 2010, moderate TR, mild right ventricular enlargement  . Tachy-brady syndrome ([place])    s/p pacer 07/2009  . Thyroid nodule    non-neoplastic goiter  . Type II diabetes mellitus ([place])   . Venous insufficiency    Chronic    Past Surgical History:  Procedure Laterality Date  . ABDOMINAL HYSTERECTOMY    . ANKLE FRACTURE SURGERY Right   . BACK SURGERY    . BREAST LUMPECTOMY Left   . FRACTURE SURGERY    . INSERT / REPLACE / REMOVE PACEMAKER  06/30/2009   MDT Adalpta L implanted by Dr Olevia Perches  . LUMBAR LAMINECTOMY  1973, 06/22/2011   "no hardware either time"    Family History  Problem Relation Age of Onset  . Heart disease Mother   . Hypertension Mother   . COPD Father   . Hypertension Father   . Hypertension Sister   . Hypertension Brother   . Hypertension Daughter   . Colon cancer Neg Hx   . Heart attack Neg Hx   . Stroke Neg Hx     Social History   Tobacco Use  . Smoking status: Former Smoker    Packs/day: 1.00    Years: 47.00    Pack years: 47.00    Types: Cigarettes    Last attempt to quit: 10/26/1998    Years since quitting: 19.2  . Smokeless tobacco: Never Used  Substance Use Topics  . Alcohol use: No    Comment: 10/14/2016 "I'll have a beer q once in awhile"    Subjective:  Patient was seen at the ER on 01/14/18; was found to have cellulitis on left foot and UTI; was  started on Bactrim DS for symptoms but could not tolerate the antibiotic; only able to take the medicine for 1 day; concerned that UTI is still present- still having some discomfort; Also requesting updated order for PT to come to her home;   Objective:  Vitals:   01/21/18 1531  BP: 126/80  Pulse: 72  Temp: 97.7 F (36.5 C)  TempSrc: Oral  SpO2: 95%  Weight: 246 lb 1.9 oz (111.6 kg)  Height: 6' (1.829 m)    General: Well developed, well nourished, in no acute distress  Skin : Warm and dry.  Head: Normocephalic and atraumatic  Lungs: Respirations unlabored; clear to auscultation bilaterally without wheeze, rales, rhonchi  CVS exam: normal rate and regular rhythm.  Neurologic: Alert and oriented; speech intact; face symmetrical; moves all extremities well; CNII-XII intact without focal deficit  Assessment:  1. Dysuria   2. Neuropathy     Plan:  1. Update U/A and urine culture today; will try treating with Augmentin; follow-up to be determined. 2. Referral for Home health/ PT updated as requested.    No follow-ups on file.  Orders Placed This Encounter  Procedures  . Urine Culture    Standing Status:   Future    Number of Occurrences:   1    Standing Expiration Date:   01/21/2019  . Ambulatory referral to Home Health    Referral Priority:   Routine    Referral Type:   Home Health Care    Referral Reason:   Specialty Services Required    Requested Specialty:   [facility]    Number of Visits Requested:   1  . POCT Urinalysis Dipstick (Automated)    Requested Prescriptions   Signed Prescriptions Disp Refills  . amoxicillin-clavulanate (AUGMENTIN) 875-125 MG tablet 14 tablet 0    Sig: Take 1 tablet by mouth 2 (two) times daily.

## 2018-01-22 LAB — URINE CULTURE
MICRO NUMBER: 90501355
SPECIMEN QUALITY:: ADEQUATE

## 2018-01-23 ENCOUNTER — Other Ambulatory Visit: Payer: Self-pay | Admitting: Family

## 2018-01-23 DIAGNOSIS — G6289 Other specified polyneuropathies: Secondary | ICD-10-CM

## 2018-01-27 ENCOUNTER — Other Ambulatory Visit: Payer: Self-pay | Admitting: Internal Medicine

## 2018-01-29 DIAGNOSIS — I130 Hypertensive heart and chronic kidney disease with heart failure and stage 1 through stage 4 chronic kidney disease, or unspecified chronic kidney disease: Secondary | ICD-10-CM | POA: Diagnosis not present

## 2018-01-29 DIAGNOSIS — N390 Urinary tract infection, site not specified: Secondary | ICD-10-CM | POA: Diagnosis not present

## 2018-01-29 DIAGNOSIS — D509 Iron deficiency anemia, unspecified: Secondary | ICD-10-CM | POA: Diagnosis not present

## 2018-01-29 DIAGNOSIS — E1142 Type 2 diabetes mellitus with diabetic polyneuropathy: Secondary | ICD-10-CM | POA: Diagnosis not present

## 2018-01-29 DIAGNOSIS — E1122 Type 2 diabetes mellitus with diabetic chronic kidney disease: Secondary | ICD-10-CM | POA: Diagnosis not present

## 2018-01-29 DIAGNOSIS — I272 Pulmonary hypertension, unspecified: Secondary | ICD-10-CM | POA: Diagnosis not present

## 2018-01-29 DIAGNOSIS — I5032 Chronic diastolic (congestive) heart failure: Secondary | ICD-10-CM | POA: Diagnosis not present

## 2018-01-29 DIAGNOSIS — L03116 Cellulitis of left lower limb: Secondary | ICD-10-CM | POA: Diagnosis not present

## 2018-01-29 DIAGNOSIS — N184 Chronic kidney disease, stage 4 (severe): Secondary | ICD-10-CM | POA: Diagnosis not present

## 2018-02-02 ENCOUNTER — Telehealth: Payer: Self-pay | Admitting: Internal Medicine

## 2018-02-02 DIAGNOSIS — I5032 Chronic diastolic (congestive) heart failure: Secondary | ICD-10-CM | POA: Diagnosis not present

## 2018-02-02 DIAGNOSIS — N390 Urinary tract infection, site not specified: Secondary | ICD-10-CM | POA: Diagnosis not present

## 2018-02-02 DIAGNOSIS — E1122 Type 2 diabetes mellitus with diabetic chronic kidney disease: Secondary | ICD-10-CM | POA: Diagnosis not present

## 2018-02-02 DIAGNOSIS — E1142 Type 2 diabetes mellitus with diabetic polyneuropathy: Secondary | ICD-10-CM | POA: Diagnosis not present

## 2018-02-02 DIAGNOSIS — D509 Iron deficiency anemia, unspecified: Secondary | ICD-10-CM | POA: Diagnosis not present

## 2018-02-02 DIAGNOSIS — N184 Chronic kidney disease, stage 4 (severe): Secondary | ICD-10-CM | POA: Diagnosis not present

## 2018-02-02 DIAGNOSIS — I130 Hypertensive heart and chronic kidney disease with heart failure and stage 1 through stage 4 chronic kidney disease, or unspecified chronic kidney disease: Secondary | ICD-10-CM | POA: Diagnosis not present

## 2018-02-02 DIAGNOSIS — L03116 Cellulitis of left lower limb: Secondary | ICD-10-CM | POA: Diagnosis not present

## 2018-02-02 DIAGNOSIS — I272 Pulmonary hypertension, unspecified: Secondary | ICD-10-CM | POA: Diagnosis not present

## 2018-02-02 NOTE — Telephone Encounter (Signed)
Copied from [place] [phone]. Topic: Quick Communication - See Telephone Encounter >> Feb 02, 2018  3:34 PM Percell Belt A wrote: CRM for notification. See Telephone encounter for: 02/02/18. Mickel Baas from [facility] home health  [phone] Need verbals for PT  3 week 2 2 week 4 And for a nurse eval

## 2018-02-03 DIAGNOSIS — N184 Chronic kidney disease, stage 4 (severe): Secondary | ICD-10-CM | POA: Diagnosis not present

## 2018-02-03 DIAGNOSIS — N390 Urinary tract infection, site not specified: Secondary | ICD-10-CM | POA: Diagnosis not present

## 2018-02-03 DIAGNOSIS — I5032 Chronic diastolic (congestive) heart failure: Secondary | ICD-10-CM | POA: Diagnosis not present

## 2018-02-03 DIAGNOSIS — L03116 Cellulitis of left lower limb: Secondary | ICD-10-CM | POA: Diagnosis not present

## 2018-02-03 DIAGNOSIS — E1142 Type 2 diabetes mellitus with diabetic polyneuropathy: Secondary | ICD-10-CM | POA: Diagnosis not present

## 2018-02-03 DIAGNOSIS — D509 Iron deficiency anemia, unspecified: Secondary | ICD-10-CM | POA: Diagnosis not present

## 2018-02-03 DIAGNOSIS — I272 Pulmonary hypertension, unspecified: Secondary | ICD-10-CM | POA: Diagnosis not present

## 2018-02-03 DIAGNOSIS — E1122 Type 2 diabetes mellitus with diabetic chronic kidney disease: Secondary | ICD-10-CM | POA: Diagnosis not present

## 2018-02-03 DIAGNOSIS — I130 Hypertensive heart and chronic kidney disease with heart failure and stage 1 through stage 4 chronic kidney disease, or unspecified chronic kidney disease: Secondary | ICD-10-CM | POA: Diagnosis not present

## 2018-02-03 NOTE — Telephone Encounter (Signed)
yes

## 2018-02-03 NOTE — Telephone Encounter (Signed)
MD is out of the office pls advise on msg below../lmb 

## 2018-02-03 NOTE — Telephone Encounter (Signed)
Notified Laura w/ MD response../lmb ?

## 2018-02-04 DIAGNOSIS — N390 Urinary tract infection, site not specified: Secondary | ICD-10-CM | POA: Diagnosis not present

## 2018-02-04 DIAGNOSIS — I130 Hypertensive heart and chronic kidney disease with heart failure and stage 1 through stage 4 chronic kidney disease, or unspecified chronic kidney disease: Secondary | ICD-10-CM | POA: Diagnosis not present

## 2018-02-04 DIAGNOSIS — E1122 Type 2 diabetes mellitus with diabetic chronic kidney disease: Secondary | ICD-10-CM | POA: Diagnosis not present

## 2018-02-04 DIAGNOSIS — I272 Pulmonary hypertension, unspecified: Secondary | ICD-10-CM | POA: Diagnosis not present

## 2018-02-04 DIAGNOSIS — N184 Chronic kidney disease, stage 4 (severe): Secondary | ICD-10-CM | POA: Diagnosis not present

## 2018-02-04 DIAGNOSIS — L03116 Cellulitis of left lower limb: Secondary | ICD-10-CM | POA: Diagnosis not present

## 2018-02-04 DIAGNOSIS — E1142 Type 2 diabetes mellitus with diabetic polyneuropathy: Secondary | ICD-10-CM | POA: Diagnosis not present

## 2018-02-04 DIAGNOSIS — D509 Iron deficiency anemia, unspecified: Secondary | ICD-10-CM | POA: Diagnosis not present

## 2018-02-04 DIAGNOSIS — I5032 Chronic diastolic (congestive) heart failure: Secondary | ICD-10-CM | POA: Diagnosis not present

## 2018-02-06 DIAGNOSIS — E1142 Type 2 diabetes mellitus with diabetic polyneuropathy: Secondary | ICD-10-CM | POA: Diagnosis not present

## 2018-02-06 DIAGNOSIS — I272 Pulmonary hypertension, unspecified: Secondary | ICD-10-CM | POA: Diagnosis not present

## 2018-02-06 DIAGNOSIS — N184 Chronic kidney disease, stage 4 (severe): Secondary | ICD-10-CM | POA: Diagnosis not present

## 2018-02-06 DIAGNOSIS — D509 Iron deficiency anemia, unspecified: Secondary | ICD-10-CM | POA: Diagnosis not present

## 2018-02-06 DIAGNOSIS — N390 Urinary tract infection, site not specified: Secondary | ICD-10-CM | POA: Diagnosis not present

## 2018-02-06 DIAGNOSIS — I130 Hypertensive heart and chronic kidney disease with heart failure and stage 1 through stage 4 chronic kidney disease, or unspecified chronic kidney disease: Secondary | ICD-10-CM | POA: Diagnosis not present

## 2018-02-06 DIAGNOSIS — I5032 Chronic diastolic (congestive) heart failure: Secondary | ICD-10-CM | POA: Diagnosis not present

## 2018-02-06 DIAGNOSIS — E1122 Type 2 diabetes mellitus with diabetic chronic kidney disease: Secondary | ICD-10-CM | POA: Diagnosis not present

## 2018-02-06 DIAGNOSIS — L03116 Cellulitis of left lower limb: Secondary | ICD-10-CM | POA: Diagnosis not present

## 2018-02-09 DIAGNOSIS — I130 Hypertensive heart and chronic kidney disease with heart failure and stage 1 through stage 4 chronic kidney disease, or unspecified chronic kidney disease: Secondary | ICD-10-CM | POA: Diagnosis not present

## 2018-02-09 DIAGNOSIS — D509 Iron deficiency anemia, unspecified: Secondary | ICD-10-CM | POA: Diagnosis not present

## 2018-02-09 DIAGNOSIS — L03116 Cellulitis of left lower limb: Secondary | ICD-10-CM | POA: Diagnosis not present

## 2018-02-09 DIAGNOSIS — E1142 Type 2 diabetes mellitus with diabetic polyneuropathy: Secondary | ICD-10-CM | POA: Diagnosis not present

## 2018-02-09 DIAGNOSIS — I272 Pulmonary hypertension, unspecified: Secondary | ICD-10-CM | POA: Diagnosis not present

## 2018-02-09 DIAGNOSIS — N390 Urinary tract infection, site not specified: Secondary | ICD-10-CM | POA: Diagnosis not present

## 2018-02-09 DIAGNOSIS — I5032 Chronic diastolic (congestive) heart failure: Secondary | ICD-10-CM | POA: Diagnosis not present

## 2018-02-09 DIAGNOSIS — E1122 Type 2 diabetes mellitus with diabetic chronic kidney disease: Secondary | ICD-10-CM | POA: Diagnosis not present

## 2018-02-09 DIAGNOSIS — N184 Chronic kidney disease, stage 4 (severe): Secondary | ICD-10-CM | POA: Diagnosis not present

## 2018-02-10 DIAGNOSIS — N184 Chronic kidney disease, stage 4 (severe): Secondary | ICD-10-CM | POA: Diagnosis not present

## 2018-02-10 DIAGNOSIS — E1122 Type 2 diabetes mellitus with diabetic chronic kidney disease: Secondary | ICD-10-CM | POA: Diagnosis not present

## 2018-02-10 DIAGNOSIS — I130 Hypertensive heart and chronic kidney disease with heart failure and stage 1 through stage 4 chronic kidney disease, or unspecified chronic kidney disease: Secondary | ICD-10-CM | POA: Diagnosis not present

## 2018-02-10 DIAGNOSIS — E1142 Type 2 diabetes mellitus with diabetic polyneuropathy: Secondary | ICD-10-CM | POA: Diagnosis not present

## 2018-02-10 DIAGNOSIS — N390 Urinary tract infection, site not specified: Secondary | ICD-10-CM | POA: Diagnosis not present

## 2018-02-10 DIAGNOSIS — L03116 Cellulitis of left lower limb: Secondary | ICD-10-CM | POA: Diagnosis not present

## 2018-02-10 DIAGNOSIS — I5032 Chronic diastolic (congestive) heart failure: Secondary | ICD-10-CM | POA: Diagnosis not present

## 2018-02-10 DIAGNOSIS — D509 Iron deficiency anemia, unspecified: Secondary | ICD-10-CM | POA: Diagnosis not present

## 2018-02-10 DIAGNOSIS — I272 Pulmonary hypertension, unspecified: Secondary | ICD-10-CM | POA: Diagnosis not present

## 2018-02-11 DIAGNOSIS — Z853 Personal history of malignant neoplasm of breast: Secondary | ICD-10-CM

## 2018-02-11 DIAGNOSIS — K5790 Diverticulosis of intestine, part unspecified, without perforation or abscess without bleeding: Secondary | ICD-10-CM

## 2018-02-11 DIAGNOSIS — I872 Venous insufficiency (chronic) (peripheral): Secondary | ICD-10-CM | POA: Diagnosis not present

## 2018-02-11 DIAGNOSIS — Z7901 Long term (current) use of anticoagulants: Secondary | ICD-10-CM

## 2018-02-11 DIAGNOSIS — I130 Hypertensive heart and chronic kidney disease with heart failure and stage 1 through stage 4 chronic kidney disease, or unspecified chronic kidney disease: Secondary | ICD-10-CM | POA: Diagnosis not present

## 2018-02-11 DIAGNOSIS — I495 Sick sinus syndrome: Secondary | ICD-10-CM

## 2018-02-11 DIAGNOSIS — E1122 Type 2 diabetes mellitus with diabetic chronic kidney disease: Secondary | ICD-10-CM | POA: Diagnosis not present

## 2018-02-11 DIAGNOSIS — I5032 Chronic diastolic (congestive) heart failure: Secondary | ICD-10-CM | POA: Diagnosis not present

## 2018-02-11 DIAGNOSIS — G8929 Other chronic pain: Secondary | ICD-10-CM

## 2018-02-11 DIAGNOSIS — F419 Anxiety disorder, unspecified: Secondary | ICD-10-CM

## 2018-02-11 DIAGNOSIS — I340 Nonrheumatic mitral (valve) insufficiency: Secondary | ICD-10-CM

## 2018-02-11 DIAGNOSIS — N184 Chronic kidney disease, stage 4 (severe): Secondary | ICD-10-CM | POA: Diagnosis not present

## 2018-02-11 DIAGNOSIS — E1142 Type 2 diabetes mellitus with diabetic polyneuropathy: Secondary | ICD-10-CM | POA: Diagnosis not present

## 2018-02-11 DIAGNOSIS — I272 Pulmonary hypertension, unspecified: Secondary | ICD-10-CM | POA: Diagnosis not present

## 2018-02-11 DIAGNOSIS — Z87891 Personal history of nicotine dependence: Secondary | ICD-10-CM

## 2018-02-11 DIAGNOSIS — N390 Urinary tract infection, site not specified: Secondary | ICD-10-CM | POA: Diagnosis not present

## 2018-02-11 DIAGNOSIS — L03116 Cellulitis of left lower limb: Secondary | ICD-10-CM | POA: Diagnosis not present

## 2018-02-11 DIAGNOSIS — D509 Iron deficiency anemia, unspecified: Secondary | ICD-10-CM | POA: Diagnosis not present

## 2018-02-11 DIAGNOSIS — M545 Low back pain: Secondary | ICD-10-CM

## 2018-02-11 DIAGNOSIS — I4891 Unspecified atrial fibrillation: Secondary | ICD-10-CM | POA: Diagnosis not present

## 2018-02-13 ENCOUNTER — Ambulatory Visit (INDEPENDENT_AMBULATORY_CARE_PROVIDER_SITE_OTHER): Payer: Medicare HMO | Admitting: *Deleted

## 2018-02-13 DIAGNOSIS — N390 Urinary tract infection, site not specified: Secondary | ICD-10-CM | POA: Diagnosis not present

## 2018-02-13 DIAGNOSIS — I130 Hypertensive heart and chronic kidney disease with heart failure and stage 1 through stage 4 chronic kidney disease, or unspecified chronic kidney disease: Secondary | ICD-10-CM | POA: Diagnosis not present

## 2018-02-13 DIAGNOSIS — N184 Chronic kidney disease, stage 4 (severe): Secondary | ICD-10-CM | POA: Diagnosis not present

## 2018-02-13 DIAGNOSIS — Z86711 Personal history of pulmonary embolism: Secondary | ICD-10-CM | POA: Diagnosis not present

## 2018-02-13 DIAGNOSIS — L03116 Cellulitis of left lower limb: Secondary | ICD-10-CM | POA: Diagnosis not present

## 2018-02-13 DIAGNOSIS — I5032 Chronic diastolic (congestive) heart failure: Secondary | ICD-10-CM | POA: Diagnosis not present

## 2018-02-13 DIAGNOSIS — Z5181 Encounter for therapeutic drug level monitoring: Secondary | ICD-10-CM

## 2018-02-13 DIAGNOSIS — I272 Pulmonary hypertension, unspecified: Secondary | ICD-10-CM | POA: Diagnosis not present

## 2018-02-13 DIAGNOSIS — D509 Iron deficiency anemia, unspecified: Secondary | ICD-10-CM | POA: Diagnosis not present

## 2018-02-13 DIAGNOSIS — E1122 Type 2 diabetes mellitus with diabetic chronic kidney disease: Secondary | ICD-10-CM | POA: Diagnosis not present

## 2018-02-13 DIAGNOSIS — E1142 Type 2 diabetes mellitus with diabetic polyneuropathy: Secondary | ICD-10-CM | POA: Diagnosis not present

## 2018-02-13 LAB — POCT INR: INR: 3

## 2018-02-13 NOTE — Patient Instructions (Signed)
Description   Continue taking normal dose of 1 tablet (2.5mg ) daily except 1/2 tablet Tuesdays, Thursdays, and Saturdays.  Recheck INR in 5 weeks. Call if needed any new medications or if scheduled for any procedures  any concerns  [phone]

## 2018-02-16 DIAGNOSIS — E1142 Type 2 diabetes mellitus with diabetic polyneuropathy: Secondary | ICD-10-CM | POA: Diagnosis not present

## 2018-02-16 DIAGNOSIS — L03116 Cellulitis of left lower limb: Secondary | ICD-10-CM | POA: Diagnosis not present

## 2018-02-16 DIAGNOSIS — N390 Urinary tract infection, site not specified: Secondary | ICD-10-CM | POA: Diagnosis not present

## 2018-02-16 DIAGNOSIS — I5032 Chronic diastolic (congestive) heart failure: Secondary | ICD-10-CM | POA: Diagnosis not present

## 2018-02-16 DIAGNOSIS — I272 Pulmonary hypertension, unspecified: Secondary | ICD-10-CM | POA: Diagnosis not present

## 2018-02-16 DIAGNOSIS — I130 Hypertensive heart and chronic kidney disease with heart failure and stage 1 through stage 4 chronic kidney disease, or unspecified chronic kidney disease: Secondary | ICD-10-CM | POA: Diagnosis not present

## 2018-02-16 DIAGNOSIS — N184 Chronic kidney disease, stage 4 (severe): Secondary | ICD-10-CM | POA: Diagnosis not present

## 2018-02-16 DIAGNOSIS — D509 Iron deficiency anemia, unspecified: Secondary | ICD-10-CM | POA: Diagnosis not present

## 2018-02-16 DIAGNOSIS — E1122 Type 2 diabetes mellitus with diabetic chronic kidney disease: Secondary | ICD-10-CM | POA: Diagnosis not present

## 2018-02-17 ENCOUNTER — Telehealth: Payer: Self-pay | Admitting: Internal Medicine

## 2018-02-17 DIAGNOSIS — N390 Urinary tract infection, site not specified: Secondary | ICD-10-CM | POA: Diagnosis not present

## 2018-02-17 DIAGNOSIS — L03116 Cellulitis of left lower limb: Secondary | ICD-10-CM | POA: Diagnosis not present

## 2018-02-17 DIAGNOSIS — I272 Pulmonary hypertension, unspecified: Secondary | ICD-10-CM | POA: Diagnosis not present

## 2018-02-17 DIAGNOSIS — E1122 Type 2 diabetes mellitus with diabetic chronic kidney disease: Secondary | ICD-10-CM | POA: Diagnosis not present

## 2018-02-17 DIAGNOSIS — E1142 Type 2 diabetes mellitus with diabetic polyneuropathy: Secondary | ICD-10-CM | POA: Diagnosis not present

## 2018-02-17 DIAGNOSIS — I130 Hypertensive heart and chronic kidney disease with heart failure and stage 1 through stage 4 chronic kidney disease, or unspecified chronic kidney disease: Secondary | ICD-10-CM | POA: Diagnosis not present

## 2018-02-17 DIAGNOSIS — N184 Chronic kidney disease, stage 4 (severe): Secondary | ICD-10-CM | POA: Diagnosis not present

## 2018-02-17 DIAGNOSIS — I5032 Chronic diastolic (congestive) heart failure: Secondary | ICD-10-CM | POA: Diagnosis not present

## 2018-02-17 DIAGNOSIS — D509 Iron deficiency anemia, unspecified: Secondary | ICD-10-CM | POA: Diagnosis not present

## 2018-02-17 MED ORDER — GABAPENTIN 300 MG PO CAPS: 300.0000 mg | ORAL_CAPSULE | Freq: Three times a day (TID) | ORAL | 3 refills | Status: DC

## 2018-02-17 NOTE — Telephone Encounter (Signed)
Copied from [place] [phone]. Topic: General - Other >> Feb 17, 2018  8:37 AM Carolyn Stare wrote:  [name] [name] with [facility]  pt home health nurse  call to say pt  continue to have pain in hands and feet would like a call back to discuss medication    [phone]

## 2018-02-17 NOTE — Telephone Encounter (Signed)
What is the question? 

## 2018-02-17 NOTE — Telephone Encounter (Signed)
patient states that the gabapentin does not always help with her hand and feet pain that sometimes she cannot walk or use hands. Insurance only covers 2 pills daily of gabapentin which she is taking. Is there something else she can take or does she need an appointment to discuss options

## 2018-02-17 NOTE — Telephone Encounter (Signed)
Called carlos back and states that the patient is having pain still in both hands and feet. Sometimes the pain will get really bad and cannot walk or do anything with her hands. If she squeezes a stress ball it seems to help but states she cant squeeze it all day. Is also wearing copper fit gloves today, not sure if they are helping but she is trying.  States that the gabapentin will help sometimes but other times it does not seem to help at all. Patient told carlos that insurance will not cover more than two pills a day? Should patient make an appointment to discuss?

## 2018-02-17 NOTE — Telephone Encounter (Signed)
Who is Clifton James and is he on [place]?

## 2018-02-17 NOTE — Telephone Encounter (Signed)
[name] [name] is the home health nurse with [facility]. But I am to call the patient back with your response. Not on DPR

## 2018-02-17 NOTE — Telephone Encounter (Signed)
Patient informed of MD response stated understanding  

## 2018-02-17 NOTE — Telephone Encounter (Signed)
I don't think that is correct about coverage. We can try going up to 3 times per day if she wants to try that. Sent in new rx.

## 2018-02-19 DIAGNOSIS — D509 Iron deficiency anemia, unspecified: Secondary | ICD-10-CM | POA: Diagnosis not present

## 2018-02-19 DIAGNOSIS — I130 Hypertensive heart and chronic kidney disease with heart failure and stage 1 through stage 4 chronic kidney disease, or unspecified chronic kidney disease: Secondary | ICD-10-CM | POA: Diagnosis not present

## 2018-02-19 DIAGNOSIS — N184 Chronic kidney disease, stage 4 (severe): Secondary | ICD-10-CM | POA: Diagnosis not present

## 2018-02-19 DIAGNOSIS — I272 Pulmonary hypertension, unspecified: Secondary | ICD-10-CM | POA: Diagnosis not present

## 2018-02-19 DIAGNOSIS — I5032 Chronic diastolic (congestive) heart failure: Secondary | ICD-10-CM | POA: Diagnosis not present

## 2018-02-19 DIAGNOSIS — N390 Urinary tract infection, site not specified: Secondary | ICD-10-CM | POA: Diagnosis not present

## 2018-02-19 DIAGNOSIS — E1142 Type 2 diabetes mellitus with diabetic polyneuropathy: Secondary | ICD-10-CM | POA: Diagnosis not present

## 2018-02-19 DIAGNOSIS — L03116 Cellulitis of left lower limb: Secondary | ICD-10-CM | POA: Diagnosis not present

## 2018-02-19 DIAGNOSIS — E1122 Type 2 diabetes mellitus with diabetic chronic kidney disease: Secondary | ICD-10-CM | POA: Diagnosis not present

## 2018-02-24 DIAGNOSIS — E1142 Type 2 diabetes mellitus with diabetic polyneuropathy: Secondary | ICD-10-CM | POA: Diagnosis not present

## 2018-02-24 DIAGNOSIS — I272 Pulmonary hypertension, unspecified: Secondary | ICD-10-CM | POA: Diagnosis not present

## 2018-02-24 DIAGNOSIS — I5032 Chronic diastolic (congestive) heart failure: Secondary | ICD-10-CM | POA: Diagnosis not present

## 2018-02-24 DIAGNOSIS — N184 Chronic kidney disease, stage 4 (severe): Secondary | ICD-10-CM | POA: Diagnosis not present

## 2018-02-24 DIAGNOSIS — I130 Hypertensive heart and chronic kidney disease with heart failure and stage 1 through stage 4 chronic kidney disease, or unspecified chronic kidney disease: Secondary | ICD-10-CM | POA: Diagnosis not present

## 2018-02-24 DIAGNOSIS — D509 Iron deficiency anemia, unspecified: Secondary | ICD-10-CM | POA: Diagnosis not present

## 2018-02-24 DIAGNOSIS — L03116 Cellulitis of left lower limb: Secondary | ICD-10-CM | POA: Diagnosis not present

## 2018-02-24 DIAGNOSIS — N390 Urinary tract infection, site not specified: Secondary | ICD-10-CM | POA: Diagnosis not present

## 2018-02-24 DIAGNOSIS — E1122 Type 2 diabetes mellitus with diabetic chronic kidney disease: Secondary | ICD-10-CM | POA: Diagnosis not present

## 2018-02-25 DIAGNOSIS — L03116 Cellulitis of left lower limb: Secondary | ICD-10-CM | POA: Diagnosis not present

## 2018-02-25 DIAGNOSIS — I130 Hypertensive heart and chronic kidney disease with heart failure and stage 1 through stage 4 chronic kidney disease, or unspecified chronic kidney disease: Secondary | ICD-10-CM | POA: Diagnosis not present

## 2018-02-25 DIAGNOSIS — I272 Pulmonary hypertension, unspecified: Secondary | ICD-10-CM | POA: Diagnosis not present

## 2018-02-25 DIAGNOSIS — N390 Urinary tract infection, site not specified: Secondary | ICD-10-CM | POA: Diagnosis not present

## 2018-02-25 DIAGNOSIS — N184 Chronic kidney disease, stage 4 (severe): Secondary | ICD-10-CM | POA: Diagnosis not present

## 2018-02-25 DIAGNOSIS — E1142 Type 2 diabetes mellitus with diabetic polyneuropathy: Secondary | ICD-10-CM | POA: Diagnosis not present

## 2018-02-25 DIAGNOSIS — E1122 Type 2 diabetes mellitus with diabetic chronic kidney disease: Secondary | ICD-10-CM | POA: Diagnosis not present

## 2018-02-25 DIAGNOSIS — I5032 Chronic diastolic (congestive) heart failure: Secondary | ICD-10-CM | POA: Diagnosis not present

## 2018-02-25 DIAGNOSIS — D509 Iron deficiency anemia, unspecified: Secondary | ICD-10-CM | POA: Diagnosis not present

## 2018-02-26 ENCOUNTER — Telehealth: Payer: Self-pay | Admitting: Internal Medicine

## 2018-02-26 DIAGNOSIS — E1122 Type 2 diabetes mellitus with diabetic chronic kidney disease: Secondary | ICD-10-CM | POA: Diagnosis not present

## 2018-02-26 DIAGNOSIS — E1142 Type 2 diabetes mellitus with diabetic polyneuropathy: Secondary | ICD-10-CM | POA: Diagnosis not present

## 2018-02-26 DIAGNOSIS — L03116 Cellulitis of left lower limb: Secondary | ICD-10-CM | POA: Diagnosis not present

## 2018-02-26 DIAGNOSIS — N184 Chronic kidney disease, stage 4 (severe): Secondary | ICD-10-CM | POA: Diagnosis not present

## 2018-02-26 DIAGNOSIS — N390 Urinary tract infection, site not specified: Secondary | ICD-10-CM | POA: Diagnosis not present

## 2018-02-26 DIAGNOSIS — I5032 Chronic diastolic (congestive) heart failure: Secondary | ICD-10-CM | POA: Diagnosis not present

## 2018-02-26 DIAGNOSIS — I272 Pulmonary hypertension, unspecified: Secondary | ICD-10-CM | POA: Diagnosis not present

## 2018-02-26 DIAGNOSIS — I130 Hypertensive heart and chronic kidney disease with heart failure and stage 1 through stage 4 chronic kidney disease, or unspecified chronic kidney disease: Secondary | ICD-10-CM | POA: Diagnosis not present

## 2018-02-26 DIAGNOSIS — D509 Iron deficiency anemia, unspecified: Secondary | ICD-10-CM | POA: Diagnosis not present

## 2018-02-26 NOTE — Telephone Encounter (Signed)
Fine

## 2018-02-26 NOTE — Telephone Encounter (Signed)
Copied from [place] [id number]. Topic: Quick Communication - See Telephone Encounter >> Feb 26, 2018  3:45 PM Boyd Kerbs wrote: CRM for notification. See Telephone encounter for: 02/26/18.  Mickel Baas from [facility] [phone] verbal orders need for PT 2 x 3  May leave message

## 2018-02-27 NOTE — Telephone Encounter (Signed)
Called laura no answer LMOM w/MD response.Marland KitchenJohny Chess

## 2018-03-02 DIAGNOSIS — I272 Pulmonary hypertension, unspecified: Secondary | ICD-10-CM | POA: Diagnosis not present

## 2018-03-02 DIAGNOSIS — L03116 Cellulitis of left lower limb: Secondary | ICD-10-CM | POA: Diagnosis not present

## 2018-03-02 DIAGNOSIS — E1122 Type 2 diabetes mellitus with diabetic chronic kidney disease: Secondary | ICD-10-CM | POA: Diagnosis not present

## 2018-03-02 DIAGNOSIS — N184 Chronic kidney disease, stage 4 (severe): Secondary | ICD-10-CM | POA: Diagnosis not present

## 2018-03-02 DIAGNOSIS — I5032 Chronic diastolic (congestive) heart failure: Secondary | ICD-10-CM | POA: Diagnosis not present

## 2018-03-02 DIAGNOSIS — I130 Hypertensive heart and chronic kidney disease with heart failure and stage 1 through stage 4 chronic kidney disease, or unspecified chronic kidney disease: Secondary | ICD-10-CM | POA: Diagnosis not present

## 2018-03-02 DIAGNOSIS — E1142 Type 2 diabetes mellitus with diabetic polyneuropathy: Secondary | ICD-10-CM | POA: Diagnosis not present

## 2018-03-02 DIAGNOSIS — N390 Urinary tract infection, site not specified: Secondary | ICD-10-CM | POA: Diagnosis not present

## 2018-03-02 DIAGNOSIS — D509 Iron deficiency anemia, unspecified: Secondary | ICD-10-CM | POA: Diagnosis not present

## 2018-03-03 DIAGNOSIS — I272 Pulmonary hypertension, unspecified: Secondary | ICD-10-CM | POA: Diagnosis not present

## 2018-03-03 DIAGNOSIS — E1142 Type 2 diabetes mellitus with diabetic polyneuropathy: Secondary | ICD-10-CM | POA: Diagnosis not present

## 2018-03-03 DIAGNOSIS — N390 Urinary tract infection, site not specified: Secondary | ICD-10-CM | POA: Diagnosis not present

## 2018-03-03 DIAGNOSIS — N184 Chronic kidney disease, stage 4 (severe): Secondary | ICD-10-CM | POA: Diagnosis not present

## 2018-03-03 DIAGNOSIS — L03116 Cellulitis of left lower limb: Secondary | ICD-10-CM | POA: Diagnosis not present

## 2018-03-03 DIAGNOSIS — I130 Hypertensive heart and chronic kidney disease with heart failure and stage 1 through stage 4 chronic kidney disease, or unspecified chronic kidney disease: Secondary | ICD-10-CM | POA: Diagnosis not present

## 2018-03-03 DIAGNOSIS — E1122 Type 2 diabetes mellitus with diabetic chronic kidney disease: Secondary | ICD-10-CM | POA: Diagnosis not present

## 2018-03-03 DIAGNOSIS — D509 Iron deficiency anemia, unspecified: Secondary | ICD-10-CM | POA: Diagnosis not present

## 2018-03-03 DIAGNOSIS — I5032 Chronic diastolic (congestive) heart failure: Secondary | ICD-10-CM | POA: Diagnosis not present

## 2018-03-05 DIAGNOSIS — D509 Iron deficiency anemia, unspecified: Secondary | ICD-10-CM | POA: Diagnosis not present

## 2018-03-05 DIAGNOSIS — I5032 Chronic diastolic (congestive) heart failure: Secondary | ICD-10-CM | POA: Diagnosis not present

## 2018-03-05 DIAGNOSIS — E1122 Type 2 diabetes mellitus with diabetic chronic kidney disease: Secondary | ICD-10-CM | POA: Diagnosis not present

## 2018-03-05 DIAGNOSIS — N184 Chronic kidney disease, stage 4 (severe): Secondary | ICD-10-CM | POA: Diagnosis not present

## 2018-03-05 DIAGNOSIS — I272 Pulmonary hypertension, unspecified: Secondary | ICD-10-CM | POA: Diagnosis not present

## 2018-03-05 DIAGNOSIS — L03116 Cellulitis of left lower limb: Secondary | ICD-10-CM | POA: Diagnosis not present

## 2018-03-05 DIAGNOSIS — E1142 Type 2 diabetes mellitus with diabetic polyneuropathy: Secondary | ICD-10-CM | POA: Diagnosis not present

## 2018-03-05 DIAGNOSIS — I130 Hypertensive heart and chronic kidney disease with heart failure and stage 1 through stage 4 chronic kidney disease, or unspecified chronic kidney disease: Secondary | ICD-10-CM | POA: Diagnosis not present

## 2018-03-05 DIAGNOSIS — N390 Urinary tract infection, site not specified: Secondary | ICD-10-CM | POA: Diagnosis not present

## 2018-03-10 ENCOUNTER — Other Ambulatory Visit: Payer: Self-pay | Admitting: Internal Medicine

## 2018-03-10 DIAGNOSIS — L03116 Cellulitis of left lower limb: Secondary | ICD-10-CM | POA: Diagnosis not present

## 2018-03-10 DIAGNOSIS — D509 Iron deficiency anemia, unspecified: Secondary | ICD-10-CM | POA: Diagnosis not present

## 2018-03-10 DIAGNOSIS — E1142 Type 2 diabetes mellitus with diabetic polyneuropathy: Secondary | ICD-10-CM | POA: Diagnosis not present

## 2018-03-10 DIAGNOSIS — N390 Urinary tract infection, site not specified: Secondary | ICD-10-CM | POA: Diagnosis not present

## 2018-03-10 DIAGNOSIS — N184 Chronic kidney disease, stage 4 (severe): Secondary | ICD-10-CM | POA: Diagnosis not present

## 2018-03-10 DIAGNOSIS — I5032 Chronic diastolic (congestive) heart failure: Secondary | ICD-10-CM | POA: Diagnosis not present

## 2018-03-10 DIAGNOSIS — I272 Pulmonary hypertension, unspecified: Secondary | ICD-10-CM | POA: Diagnosis not present

## 2018-03-10 DIAGNOSIS — I130 Hypertensive heart and chronic kidney disease with heart failure and stage 1 through stage 4 chronic kidney disease, or unspecified chronic kidney disease: Secondary | ICD-10-CM | POA: Diagnosis not present

## 2018-03-10 DIAGNOSIS — E1122 Type 2 diabetes mellitus with diabetic chronic kidney disease: Secondary | ICD-10-CM | POA: Diagnosis not present

## 2018-03-10 NOTE — Telephone Encounter (Signed)
Control database checked last refill: 02/12/2018

## 2018-03-12 DIAGNOSIS — D509 Iron deficiency anemia, unspecified: Secondary | ICD-10-CM | POA: Diagnosis not present

## 2018-03-12 DIAGNOSIS — N390 Urinary tract infection, site not specified: Secondary | ICD-10-CM | POA: Diagnosis not present

## 2018-03-12 DIAGNOSIS — I5032 Chronic diastolic (congestive) heart failure: Secondary | ICD-10-CM | POA: Diagnosis not present

## 2018-03-12 DIAGNOSIS — E1142 Type 2 diabetes mellitus with diabetic polyneuropathy: Secondary | ICD-10-CM | POA: Diagnosis not present

## 2018-03-12 DIAGNOSIS — I130 Hypertensive heart and chronic kidney disease with heart failure and stage 1 through stage 4 chronic kidney disease, or unspecified chronic kidney disease: Secondary | ICD-10-CM | POA: Diagnosis not present

## 2018-03-12 DIAGNOSIS — L03116 Cellulitis of left lower limb: Secondary | ICD-10-CM | POA: Diagnosis not present

## 2018-03-12 DIAGNOSIS — E1122 Type 2 diabetes mellitus with diabetic chronic kidney disease: Secondary | ICD-10-CM | POA: Diagnosis not present

## 2018-03-12 DIAGNOSIS — N184 Chronic kidney disease, stage 4 (severe): Secondary | ICD-10-CM | POA: Diagnosis not present

## 2018-03-12 DIAGNOSIS — I272 Pulmonary hypertension, unspecified: Secondary | ICD-10-CM | POA: Diagnosis not present

## 2018-03-16 DIAGNOSIS — E1142 Type 2 diabetes mellitus with diabetic polyneuropathy: Secondary | ICD-10-CM | POA: Diagnosis not present

## 2018-03-16 DIAGNOSIS — N390 Urinary tract infection, site not specified: Secondary | ICD-10-CM | POA: Diagnosis not present

## 2018-03-16 DIAGNOSIS — D509 Iron deficiency anemia, unspecified: Secondary | ICD-10-CM | POA: Diagnosis not present

## 2018-03-16 DIAGNOSIS — L03116 Cellulitis of left lower limb: Secondary | ICD-10-CM | POA: Diagnosis not present

## 2018-03-16 DIAGNOSIS — E1122 Type 2 diabetes mellitus with diabetic chronic kidney disease: Secondary | ICD-10-CM | POA: Diagnosis not present

## 2018-03-16 DIAGNOSIS — I130 Hypertensive heart and chronic kidney disease with heart failure and stage 1 through stage 4 chronic kidney disease, or unspecified chronic kidney disease: Secondary | ICD-10-CM | POA: Diagnosis not present

## 2018-03-16 DIAGNOSIS — I272 Pulmonary hypertension, unspecified: Secondary | ICD-10-CM | POA: Diagnosis not present

## 2018-03-16 DIAGNOSIS — I5032 Chronic diastolic (congestive) heart failure: Secondary | ICD-10-CM | POA: Diagnosis not present

## 2018-03-16 DIAGNOSIS — N184 Chronic kidney disease, stage 4 (severe): Secondary | ICD-10-CM | POA: Diagnosis not present

## 2018-03-17 DIAGNOSIS — L03116 Cellulitis of left lower limb: Secondary | ICD-10-CM | POA: Diagnosis not present

## 2018-03-17 DIAGNOSIS — E1122 Type 2 diabetes mellitus with diabetic chronic kidney disease: Secondary | ICD-10-CM | POA: Diagnosis not present

## 2018-03-17 DIAGNOSIS — D509 Iron deficiency anemia, unspecified: Secondary | ICD-10-CM | POA: Diagnosis not present

## 2018-03-17 DIAGNOSIS — N184 Chronic kidney disease, stage 4 (severe): Secondary | ICD-10-CM | POA: Diagnosis not present

## 2018-03-17 DIAGNOSIS — N390 Urinary tract infection, site not specified: Secondary | ICD-10-CM | POA: Diagnosis not present

## 2018-03-17 DIAGNOSIS — I5032 Chronic diastolic (congestive) heart failure: Secondary | ICD-10-CM | POA: Diagnosis not present

## 2018-03-17 DIAGNOSIS — I272 Pulmonary hypertension, unspecified: Secondary | ICD-10-CM | POA: Diagnosis not present

## 2018-03-17 DIAGNOSIS — E1142 Type 2 diabetes mellitus with diabetic polyneuropathy: Secondary | ICD-10-CM | POA: Diagnosis not present

## 2018-03-17 DIAGNOSIS — I130 Hypertensive heart and chronic kidney disease with heart failure and stage 1 through stage 4 chronic kidney disease, or unspecified chronic kidney disease: Secondary | ICD-10-CM | POA: Diagnosis not present

## 2018-03-19 DIAGNOSIS — E1122 Type 2 diabetes mellitus with diabetic chronic kidney disease: Secondary | ICD-10-CM | POA: Diagnosis not present

## 2018-03-19 DIAGNOSIS — I5032 Chronic diastolic (congestive) heart failure: Secondary | ICD-10-CM | POA: Diagnosis not present

## 2018-03-19 DIAGNOSIS — I272 Pulmonary hypertension, unspecified: Secondary | ICD-10-CM | POA: Diagnosis not present

## 2018-03-19 DIAGNOSIS — D509 Iron deficiency anemia, unspecified: Secondary | ICD-10-CM | POA: Diagnosis not present

## 2018-03-19 DIAGNOSIS — E1142 Type 2 diabetes mellitus with diabetic polyneuropathy: Secondary | ICD-10-CM | POA: Diagnosis not present

## 2018-03-19 DIAGNOSIS — I130 Hypertensive heart and chronic kidney disease with heart failure and stage 1 through stage 4 chronic kidney disease, or unspecified chronic kidney disease: Secondary | ICD-10-CM | POA: Diagnosis not present

## 2018-03-19 DIAGNOSIS — N390 Urinary tract infection, site not specified: Secondary | ICD-10-CM | POA: Diagnosis not present

## 2018-03-19 DIAGNOSIS — N184 Chronic kidney disease, stage 4 (severe): Secondary | ICD-10-CM | POA: Diagnosis not present

## 2018-03-19 DIAGNOSIS — L03116 Cellulitis of left lower limb: Secondary | ICD-10-CM | POA: Diagnosis not present

## 2018-03-20 ENCOUNTER — Ambulatory Visit (INDEPENDENT_AMBULATORY_CARE_PROVIDER_SITE_OTHER): Payer: Medicare HMO | Admitting: *Deleted

## 2018-03-20 DIAGNOSIS — Z5181 Encounter for therapeutic drug level monitoring: Secondary | ICD-10-CM

## 2018-03-20 DIAGNOSIS — Z86711 Personal history of pulmonary embolism: Secondary | ICD-10-CM | POA: Diagnosis not present

## 2018-03-20 LAB — POCT INR: INR: 3.8 — AB (ref 2.0–3.0)

## 2018-03-20 NOTE — Patient Instructions (Addendum)
Description   Do not take any Coumadin tomorrow then continue taking normal dose of 1 tablet (2.5mg ) daily except 1/2 tablet Tuesdays, Thursdays, and Saturdays.  Keep dark greens consistent at 4 times a week on Tuesday, Thursday, Saturday, and Sunday.  Recheck INR in 2 weeks. Call if needed any new medications or if scheduled for any procedures  any concerns  [phone]

## 2018-03-31 ENCOUNTER — Ambulatory Visit (INDEPENDENT_AMBULATORY_CARE_PROVIDER_SITE_OTHER): Payer: Medicare HMO | Admitting: Cardiology

## 2018-03-31 ENCOUNTER — Emergency Department (HOSPITAL_COMMUNITY): Payer: Medicare HMO

## 2018-03-31 ENCOUNTER — Encounter (HOSPITAL_COMMUNITY): Payer: Self-pay

## 2018-03-31 ENCOUNTER — Emergency Department (HOSPITAL_COMMUNITY)
Admission: EM | Admit: 2018-03-31 | Discharge: 2018-03-31 | Disposition: A | Discharge status: Home / Self Care | Payer: Medicare HMO | Attending: Emergency Medicine | Admitting: Emergency Medicine

## 2018-03-31 DIAGNOSIS — Z5181 Encounter for therapeutic drug level monitoring: Secondary | ICD-10-CM | POA: Diagnosis not present

## 2018-03-31 DIAGNOSIS — Z87891 Personal history of nicotine dependence: Secondary | ICD-10-CM | POA: Diagnosis not present

## 2018-03-31 DIAGNOSIS — Z79899 Other long term (current) drug therapy: Secondary | ICD-10-CM | POA: Diagnosis not present

## 2018-03-31 DIAGNOSIS — E119 Type 2 diabetes mellitus without complications: Secondary | ICD-10-CM | POA: Diagnosis not present

## 2018-03-31 DIAGNOSIS — I2510 Atherosclerotic heart disease of native coronary artery without angina pectoris: Secondary | ICD-10-CM | POA: Insufficient documentation

## 2018-03-31 DIAGNOSIS — Z86711 Personal history of pulmonary embolism: Secondary | ICD-10-CM | POA: Diagnosis not present

## 2018-03-31 DIAGNOSIS — I509 Heart failure, unspecified: Secondary | ICD-10-CM | POA: Diagnosis not present

## 2018-03-31 DIAGNOSIS — Z950 Presence of cardiac pacemaker: Secondary | ICD-10-CM | POA: Diagnosis not present

## 2018-03-31 DIAGNOSIS — Z7901 Long term (current) use of anticoagulants: Secondary | ICD-10-CM | POA: Insufficient documentation

## 2018-03-31 DIAGNOSIS — R110 Nausea: Secondary | ICD-10-CM | POA: Diagnosis not present

## 2018-03-31 DIAGNOSIS — I130 Hypertensive heart and chronic kidney disease with heart failure and stage 1 through stage 4 chronic kidney disease, or unspecified chronic kidney disease: Secondary | ICD-10-CM | POA: Diagnosis not present

## 2018-03-31 DIAGNOSIS — N184 Chronic kidney disease, stage 4 (severe): Secondary | ICD-10-CM | POA: Diagnosis not present

## 2018-03-31 DIAGNOSIS — I4891 Unspecified atrial fibrillation: Secondary | ICD-10-CM | POA: Diagnosis not present

## 2018-03-31 DIAGNOSIS — I5032 Chronic diastolic (congestive) heart failure: Secondary | ICD-10-CM | POA: Insufficient documentation

## 2018-03-31 LAB — PROTIME-INR
INR: 2.47
Prothrombin Time: 26.6 seconds — ABNORMAL HIGH (ref 11.4–15.2)

## 2018-03-31 LAB — COMPREHENSIVE METABOLIC PANEL
ALBUMIN: 3.4 g/dL — AB (ref 3.5–5.0)
ALK PHOS: 96 U/L (ref 38–126)
ALT: 15 U/L (ref 0–44)
AST: 24 U/L (ref 15–41)
Anion gap: 10 (ref 5–15)
BUN: 20 mg/dL (ref 8–23)
CALCIUM: 8.8 mg/dL — AB (ref 8.9–10.3)
CO2: 27 mmol/L (ref 22–32)
CREATININE: 1.69 mg/dL — AB (ref 0.44–1.00)
Chloride: 104 mmol/L (ref 98–111)
GFR calc non Af Amer: 26 mL/min — ABNORMAL LOW (ref >60)
GFR, EST AFRICAN AMERICAN: 31 mL/min — AB (ref >60)
GLUCOSE: 134 mg/dL — AB (ref 70–99)
Potassium: 4.1 mmol/L (ref 3.5–5.1)
Sodium: 141 mmol/L (ref 135–145)
Total Bilirubin: 1.6 mg/dL — ABNORMAL HIGH (ref 0.3–1.2)
Total Protein: 7 g/dL (ref 6.5–8.1)

## 2018-03-31 LAB — CBC WITH DIFFERENTIAL/PLATELET
ABS IMMATURE GRANULOCYTES: 0 10*3/uL (ref 0.0–0.1)
BASOS ABS: 0.1 10*3/uL (ref 0.0–0.1)
Basophils Relative: 2 %
Eosinophils Absolute: 0.1 10*3/uL (ref 0.0–0.7)
Eosinophils Relative: 2 %
HCT: 41.1 % (ref 36.0–46.0)
HEMOGLOBIN: 12.8 g/dL (ref 12.0–15.0)
Immature Granulocytes: 0 %
LYMPHS ABS: 0.6 10*3/uL — AB (ref 0.7–4.0)
LYMPHS PCT: 18 %
MCH: 29.1 pg (ref 26.0–34.0)
MCHC: 31.1 g/dL (ref 30.0–36.0)
MCV: 93.4 fL (ref 78.0–100.0)
MONO ABS: 0.3 10*3/uL (ref 0.1–1.0)
MONOS PCT: 10 %
NEUTROS ABS: 2.2 10*3/uL (ref 1.7–7.7)
Neutrophils Relative %: 68 %
Platelets: 212 10*3/uL (ref 150–400)
RBC: 4.4 MIL/uL (ref 3.87–5.11)
RDW: 14.7 % (ref 11.5–15.5)
WBC: 3.2 10*3/uL — ABNORMAL LOW (ref 4.0–10.5)

## 2018-03-31 LAB — I-STAT TROPONIN, ED
TROPONIN I, POC: 0.01 ng/mL (ref 0.00–0.08)
Troponin i, poc: 0.02 ng/mL (ref 0.00–0.08)

## 2018-03-31 LAB — CBG MONITORING, ED: GLUCOSE-CAPILLARY: 154 mg/dL — AB (ref 70–99)

## 2018-03-31 MED ORDER — METOPROLOL SUCCINATE ER 25 MG PO TB24
25.0000 mg | ORAL_TABLET | Freq: Every day | ORAL | Status: DC
  Administered 2018-03-31: 25 mg via ORAL
  Filled 2018-03-31 (×2): qty 1

## 2018-03-31 NOTE — ED Notes (Signed)
CBC and CMP recollected and sent to lab

## 2018-03-31 NOTE — ED Notes (Signed)
ED Provider at bedside. 

## 2018-03-31 NOTE — Discharge Instructions (Signed)
It is not completely clear what caused you to feel badly today however it may have been an episode of atrial fibrillation.  However your lab work today including your kidney function, blood counts and electrolytes all look normal.  Your heart markers were normal and your EKG shows no new findings.  Your pacemaker is working appropriately.  If you start having recurrence of the symptoms or the symptoms do not go away or you start having urinary symptoms you may always return to the emergency room or follow-up with your regular doctor.

## 2018-03-31 NOTE — ED Triage Notes (Signed)
Pt arrived via [facility]; per EMS pt from home with c/o nausea that started last night; denies chest CP; Sob; Pt has hx of breast CA w/ masectomy, restricted on L; 96% on RA; 130/80; CBG 171; pt has pacemaker with HR rangling from 70-160 and hx of AFIB

## 2018-03-31 NOTE — ED Notes (Signed)
Patient verbalizes understanding of discharge instructions. Opportunity for questioning and answers were provided. Armband removed by staff, pt discharged from ED via wheelchair with daughter.  

## 2018-03-31 NOTE — ED Provider Notes (Signed)
[facility] EMERGENCY DEPARTMENT Provider Note   CSN: [id number] Arrival date & time: 03/31/18  5625     History   Chief Complaint Chief Complaint  Patient presents with  . Nausea    HPI [name] [name] is a 84 y.o. female.  Patient is an 84 year old female with a history of gastric ulcer without hemorrhage, atrial fibrillation on flecainide and Coumadin, coronary artery disease, chronic diastolic heart failure, CKD, diabetes, hypertension, PE and DVT who is presenting today with complaints of not feeling well.  Patient states she felt normal when she went to bed last night but when she woke up this morning around 7:00 she just felt terrible.  She felt nauseated but denies any vomiting.  She had no abdominal pain, chest pain, shortness of breath.  She denies recent cough, congestion or fever.  She has no urinary symptoms.  Patient had 2 normal bowel movements this morning but states she continued to feel terrible.  In route patient was given Zofran by EMS and was euglycemic.  Upon arrival here patient states she is feeling better but cannot further quantify her symptoms.  The history is provided by the patient.    Past Medical History:  Diagnosis Date  . Acute gastric ulcer without mention of hemorrhage, perforation, or obstruction 2006   EGD  . Anxiety   . Atrial fibrillation (HCC)    flecainide; coumadin  . Breast cancer ([place])    left  . Carotid artery disease ([place])    63/89: RICA 3-73%, LICA 42-87%  //  Carotiud [place] 68/11: RICA 5-72%; LICA 62-03% >> FU 1 year   . Chronic diastolic heart failure (HCC)    a. echo 10/10: mild LVH, EF 55-60%, mild AI, mild MR, mild to mod LAE, mild RVE, severe RAE, mod TR, PASP 53  /  b.  Echo 3/14:  Mild LVH, EF 55%, Tr AI, MAC, mild MR, mod LAE, PASP 31, trivial eff. // c. echo 1/17: EF 55-60%, mild AI, MAC, mild MR, moderate BAE, moderate TR  . Chronic lower back pain   . Diabetic peripheral neuropathy ([place]) 03/09/2015  .  Diverticulosis   . Esophagitis, unspecified 2006   EGD  . Hiatal hernia 2006   EGD  . History of nuclear stress test    a. Myoview 2/12: EF 69%, no scar or ischemia  . Hx of adenomatous colonic polyps   . Hypertension   . Mediastinal lymphadenopathy   . Memory difficulty 11/14/2016  . Mitral regurgitation    mild, echo, October, 2010  . Osteopenia   . Presence of permanent cardiac pacemaker   . Pulmonary embolus [facility]) 2006   2006, with DVT  . Pulmonary HTN ([place])    53 mmHg echo, 2010, moderate TR, mild right ventricular enlargement  . Tachy-brady syndrome ([place])    s/p pacer 07/2009  . Thyroid nodule    non-neoplastic goiter  . Type II diabetes mellitus ([place])   . Venous insufficiency    Chronic    Patient Active Problem List   Diagnosis Date Noted  . Morbid obesity due to excess calories ([place]) complicated by hbp 55/97/4163  . Solitary pulmonary nodule on lung CT 03/21/2017  . Amiodarone toxicity 03/21/2017  . Pacemaker complications 84/53/6468  . CKD (chronic kidney disease), stage IV ([place]) 03/14/2017  . Recurrent falls 01/13/2017  . Cognitive change   . A-fib ([place]) 10/14/2016  . Chronic back pain 07/28/2016  . Diabetes mellitus type 2, noninsulin dependent ([place])   .  CRD (chronic renal disease), stage III ([place]) 04/12/2016  . Dyspnea on exertion 04/12/2016  . Hypertensive heart disease 03/28/2016  . Anemia, iron deficiency 10/22/2015  . Diabetic peripheral neuropathy ([place]) 03/09/2015  . Acute on chronic diastolic (congestive) heart failure ([place]) 09/07/2014  . Sick sinus syndrome ([place]) 03/09/2014  . Hypertension 06/21/2013  . Osteopenia   . Pulmonary HTN ([place])   . History of pulmonary embolism   . Carotid artery disease ([place])   . History of breast cancer   . Pacemaker-Medtronic   . Syncope   . Mitral regurgitation   . Mediastinal lymphadenopathy   . THYROID NODULE, HX OF 07/19/2009    Past Surgical History:  Procedure Laterality Date  . ABDOMINAL HYSTERECTOMY     . ANKLE FRACTURE SURGERY Right   . BACK SURGERY    . BREAST LUMPECTOMY Left   . FRACTURE SURGERY    . INSERT / REPLACE / REMOVE PACEMAKER  06/30/2009   MDT Adalpta L implanted by Dr Olevia Perches  . LUMBAR LAMINECTOMY  1973, 06/22/2011   "no hardware either time"     OB History   None      Home Medications    Prior to Admission medications   Medication Sig Start Date End Date Taking? Authorizing Provider  acetaminophen (TYLENOL) 325 MG tablet Take 650 mg by mouth every 6 (six) hours as needed for mild pain.    [provider]  ALPRAZolam Duanne Moron) 0.25 MG tablet TAKE 1 TABLET TWICE A DAY AS NEEDED FOR ANXIETY 03/11/18   Hoyt Koch, MD  amoxicillin-clavulanate (AUGMENTIN) 875-125 MG tablet Take 1 tablet by mouth 2 (two) times daily. 01/21/18   Marrian Salvage, FNP  bethanechol (URECHOLINE) 25 MG tablet Take 25 mg by mouth 2 (two) times daily.    [provider]  blood glucose meter kit and supplies KIT Dispense based on patient and insurance preference. Use before breakfast and at bedtime. Diagnosis code: E11.9 03/26/17   Nche, Charlene Brooke, NP  furosemide (LASIX) 80 MG tablet TAKE 1 TABLET (80 MG TOTAL) BY MOUTH 2 (TWO) TIMES DAILY. 01/13/18   Isaiah Serge, NP  gabapentin (NEURONTIN) 300 MG capsule Take 1 capsule (300 mg total) by mouth 3 (three) times daily. 02/17/18   Hoyt Koch, MD  memantine (NAMENDA) 5 MG tablet TAKE 1 TABLET BY MOUTH TWICE A DAY 01/20/18   Ward Givens, NP  metoprolol succinate (TOPROL-XL) 25 MG 24 hr tablet Take 1 tablet (25 mg total) by mouth daily. Please make yearly appt with Dr. Rayann Heman for September for future refills. 1st attempt 01/29/18   Thompson Grayer, MD  phenazopyridine (PYRIDIUM) 100 MG tablet Take 1 tablet (100 mg total) by mouth 3 (three) times daily as needed for pain (with food). 07/25/17   Nche, Charlene Brooke, NP  potassium chloride 20 MEQ/15ML (10%) SOLN Take 20 mEq by mouth daily as needed (if have not  eaten a banana).    [provider]  promethazine (PHENERGAN) 25 MG tablet Take 1 tablet (25 mg total) by mouth every 8 (eight) hours as needed for nausea or vomiting. 10/22/16   Hoyt Koch, MD  pyridoxine (B-6) 200 MG tablet Take 200 mg by mouth daily.    [provider]  vitamin B-12 (CYANOCOBALAMIN) 500 MCG tablet Take 500 mcg by mouth daily.     [provider]  warfarin (COUMADIN) 2.5 MG tablet TAKE 1/2 TABLET TO 1 TABLET DAILY OR AS DIRECTED BY COUMADIN CLINIC Patient taking differently:  1.40m by mouth Tues, Thurs, Sat, 2.564mMon, Wed, Fri, Sun 01/08/18   NeDorothy SparkMD    Family History Family History  Problem Relation Age of Onset  . Heart disease Mother   . Hypertension Mother   . COPD Father   . Hypertension Father   . Hypertension Sister   . Hypertension Brother   . Hypertension Daughter   . Colon cancer Neg Hx   . Heart attack Neg Hx   . Stroke Neg Hx     Social History Social History   Tobacco Use  . Smoking status: Former Smoker    Packs/day: 1.00    Years: 47.00    Pack years: 47.00    Types: Cigarettes    Last attempt to quit: 10/26/1998    Years since quitting: 19.4  . Smokeless tobacco: Never Used  Substance Use Topics  . Alcohol use: No    Comment: 10/14/2016 "I'll have a beer q once in awhile"  . Drug use: No     Allergies   Amiodarone; Flecainide; Morphine and related; Bactrim ds [sulfamethoxazole-trimethoprim]; and Cephalexin   Review of Systems Review of Systems  All other systems reviewed and are negative.    Physical Exam Updated Vital Signs BP (!) 127/97 (BP Location: Right Arm)   Pulse (!) 114   Temp 97.6 F (36.4 C) (Oral)   Resp 20   SpO2 100%   Physical Exam  Constitutional: She is oriented to person, place, and time. She appears well-developed and well-nourished. No distress.  HENT:  Head: Normocephalic and atraumatic.  Eyes: Pupils are equal, round, and reactive to light. EOM  are normal.  Cardiovascular: Normal rate and intact distal pulses. An irregularly irregular rhythm present. Exam reveals gallop. Exam reveals no friction rub.  Murmur heard.  Systolic murmur is present with a grade of 1/6. Pacemaker present in the left upper chest.  Pulmonary/Chest: Effort normal and breath sounds normal. She has no wheezes. She has no rales.  Abdominal: Soft. Bowel sounds are normal. She exhibits no distension. There is no tenderness. There is no rebound and no guarding.  Musculoskeletal: Normal range of motion. She exhibits edema. She exhibits no tenderness.  Trace edema bilateral lower extremities  Neurological: She is alert and oriented to person, place, and time. No cranial nerve deficit.  Skin: Skin is warm and dry. Capillary refill takes less than 2 seconds. No rash noted.  Psychiatric: She has a normal mood and affect. Her behavior is normal.  Nursing note and vitals reviewed.    ED Treatments / Results  Labs (all labs ordered are listed, but only abnormal results are displayed) Labs Reviewed  CBC WITH DIFFERENTIAL/PLATELET - Abnormal; Notable for the following components:      Result Value   WBC 3.2 (*)    Lymphs Abs 0.6 (*)    All other components within normal limits  COMPREHENSIVE METABOLIC PANEL - Abnormal; Notable for the following components:   Glucose, Bld 134 (*)    Creatinine, Ser 1.69 (*)    Calcium 8.8 (*)    Albumin 3.4 (*)    Total Bilirubin 1.6 (*)    GFR calc non Af Amer 26 (*)    GFR calc Af Amer 31 (*)    All other components within normal limits  PROTIME-INR - Abnormal; Notable for the following components:   Prothrombin Time 26.6 (*)    All other components within normal limits  CBG MONITORING, ED - Abnormal; Notable for the following  components:   Glucose-Capillary 154 (*)    All other components within normal limits  URINALYSIS, ROUTINE W REFLEX MICROSCOPIC  I-STAT TROPONIN, ED  I-STAT TROPONIN, ED    EKG EKG  Interpretation  Date/Time:  Tuesday March 31 2018 08:44:18 EDT Ventricular Rate:  131 PR Interval:    QRS Duration: 83 QT Interval:  349 QTC Calculation: 541 R Axis:   64 Text Interpretation:  Atrial fibrillation with rapid V-rate Paired ventricular premature complexes Abnormal R-wave progression, late transition No significant change since last tracing Confirmed by Blanchie Dessert [phone]) on 03/31/2018 9:05:53 AM   Radiology Dg Chest Port 1 View  Result Date: 03/31/2018 CLINICAL DATA:  Onset of nausea last night. History of breast malignancy, CHF, coronary artery disease, and diabetes. EXAM: PORTABLE CHEST 1 VIEW COMPARISON:  PA and lateral chest x-ray of January 14, 2018 FINDINGS: The lungs are adequately inflated. The interstitial markings are coarse and slightly more conspicuous today. The cardiac silhouette is enlarged. The pulmonary vascularity is mildly prominent centrally. There is calcification in the wall of the aortic arch. The ICD is in stable position. There is old deformity of the posterior aspect of the right seventh rib. IMPRESSION: CHF with mild interstitial prominence. No alveolar pneumonia nor pleural effusion. Thoracic aortic atherosclerosis. Electronically Signed   By: David  Martinique M.D.   On: 03/31/2018 09:51    Procedures Procedures (including critical care time)  Medications Ordered in ED Medications  metoprolol succinate (TOPROL-XL) 24 hr tablet 25 mg (has no administration in time range)     Initial Impression / Assessment and Plan / ED Course  I have reviewed the triage vital signs and the nursing notes.  Pertinent labs & imaging results that were available during my care of the patient were reviewed by me and considered in my medical decision making (see chart for details).    Elderly female with multiple medical problems presenting today with multiple vague complaints.  She stating mostly that she just felt terrible when she woke up.  Some nausea but no  vomiting.  Here patient states she is feeling better.  She was given Zofran in route.  Patient does have a history of A. fib and currently is in atrial fibrillation with elevated rates between 101 20.  She is denying any infectious symptoms.  There is no signs of fluid overload.  Respiratory exam is within normal limits and she has no abdominal pain.  Low suspicion for dissection, PE, abdominal pathology.  Patient does take Coumadin for atrial fibrillation and prior PE.  Patient's EKG today shows atrial fibrillation but no other acute process. CBC, CMP, troponin, INR, chest x-ray pending.  2:16 PM Labs without acute findings.  Patient's renal function is stable and electrolytes are within normal limits.  No evidence of anemia and delta troponin is negative.  Chest x-ray with mild CHF but no pleural effusions and patient denies any shortness of breath or chest pain.  Pacemaker was interrogated which showed multiple episodes of atrial tachycardia between 120 and 200 but no episodes of ventricular tachycardia.  Patient's heart rate was in the low 100s upon arrival here but she had not taken her medication.  After medication patient's heart rate is less than 100.  She was able to stand and ambulate without difficulty and feel that she is safe for discharge home.  Concerned that may be patient had an episode of A. fib RVR this morning that made her feel bad but had proved prior to arrival here.  Findings were discussed with the patient and her daughter and she feels safe with this plan and comfortable going home.  Recommended follow-up with PCP or returning here for worsening symptoms.  Final Clinical Impressions(s) / ED Diagnoses   Final diagnoses:  Nausea    ED Discharge Orders    None       Blanchie Dessert, MD 03/31/18 1419

## 2018-03-31 NOTE — ED Notes (Signed)
RN interrogated pacemaker using Medtronic Device

## 2018-03-31 NOTE — ED Notes (Signed)
Pt ambulatory to bathroom utilizing walker with steady gait. Pt does not wish to stay for a urinalysis and wants to be discharged home. MD aware.

## 2018-03-31 NOTE — Patient Instructions (Signed)
Description   Continue taking normal dose of coumadin  1 tablet (2.5mg ) daily except 1/2 tablet Tuesdays, Thursdays, and Saturdays.  Keep dark greens consistent at 4 times a week on Tuesday, Thursday, Saturday, and Sunday.  Recheck INR in 3 weeks. Call if needed any new medications or if scheduled for any procedures  any concerns  [phone]

## 2018-04-13 ENCOUNTER — Other Ambulatory Visit: Payer: Self-pay | Admitting: Internal Medicine

## 2018-04-13 DIAGNOSIS — B351 Tinea unguium: Secondary | ICD-10-CM | POA: Diagnosis not present

## 2018-04-13 DIAGNOSIS — L851 Acquired keratosis [keratoderma] palmaris et plantaris: Secondary | ICD-10-CM | POA: Diagnosis not present

## 2018-04-13 DIAGNOSIS — E1142 Type 2 diabetes mellitus with diabetic polyneuropathy: Secondary | ICD-10-CM | POA: Diagnosis not present

## 2018-04-24 ENCOUNTER — Ambulatory Visit (INDEPENDENT_AMBULATORY_CARE_PROVIDER_SITE_OTHER): Payer: Medicare HMO

## 2018-04-24 DIAGNOSIS — Z86711 Personal history of pulmonary embolism: Secondary | ICD-10-CM

## 2018-04-24 DIAGNOSIS — Z5181 Encounter for therapeutic drug level monitoring: Secondary | ICD-10-CM | POA: Diagnosis not present

## 2018-04-24 LAB — POCT INR: INR: 2.7 (ref 2.0–3.0)

## 2018-04-24 NOTE — Patient Instructions (Signed)
Description   Continue taking normal dose of coumadin  1 tablet (2.5mg ) daily except 1/2 tablet Tuesdays, Thursdays, and Saturdays.  Keep dark greens consistent at 4 times a week on Tuesday, Thursday, Saturday, and Sunday.  Recheck INR in 4 weeks. Call if needed any new medications or if scheduled for any procedures  any concerns  [phone]

## 2018-05-04 ENCOUNTER — Emergency Department (HOSPITAL_COMMUNITY)
Admission: EM | Admit: 2018-05-04 | Discharge: 2018-05-04 | Disposition: A | Discharge status: Home / Self Care | Payer: Medicare HMO | Attending: Emergency Medicine | Admitting: Emergency Medicine

## 2018-05-04 ENCOUNTER — Other Ambulatory Visit: Payer: Self-pay

## 2018-05-04 ENCOUNTER — Encounter (HOSPITAL_COMMUNITY): Payer: Self-pay

## 2018-05-04 DIAGNOSIS — N184 Chronic kidney disease, stage 4 (severe): Secondary | ICD-10-CM | POA: Diagnosis not present

## 2018-05-04 DIAGNOSIS — Z87891 Personal history of nicotine dependence: Secondary | ICD-10-CM | POA: Diagnosis not present

## 2018-05-04 DIAGNOSIS — E1140 Type 2 diabetes mellitus with diabetic neuropathy, unspecified: Secondary | ICD-10-CM | POA: Diagnosis not present

## 2018-05-04 DIAGNOSIS — I959 Hypotension, unspecified: Secondary | ICD-10-CM | POA: Diagnosis not present

## 2018-05-04 DIAGNOSIS — Z7901 Long term (current) use of anticoagulants: Secondary | ICD-10-CM | POA: Diagnosis not present

## 2018-05-04 DIAGNOSIS — Z79899 Other long term (current) drug therapy: Secondary | ICD-10-CM | POA: Insufficient documentation

## 2018-05-04 DIAGNOSIS — I130 Hypertensive heart and chronic kidney disease with heart failure and stage 1 through stage 4 chronic kidney disease, or unspecified chronic kidney disease: Secondary | ICD-10-CM | POA: Diagnosis not present

## 2018-05-04 DIAGNOSIS — Z950 Presence of cardiac pacemaker: Secondary | ICD-10-CM | POA: Insufficient documentation

## 2018-05-04 DIAGNOSIS — I5032 Chronic diastolic (congestive) heart failure: Secondary | ICD-10-CM | POA: Insufficient documentation

## 2018-05-04 DIAGNOSIS — Z86718 Personal history of other venous thrombosis and embolism: Secondary | ICD-10-CM | POA: Insufficient documentation

## 2018-05-04 DIAGNOSIS — Z853 Personal history of malignant neoplasm of breast: Secondary | ICD-10-CM | POA: Insufficient documentation

## 2018-05-04 DIAGNOSIS — L299 Pruritus, unspecified: Secondary | ICD-10-CM | POA: Diagnosis not present

## 2018-05-04 DIAGNOSIS — I4891 Unspecified atrial fibrillation: Secondary | ICD-10-CM | POA: Insufficient documentation

## 2018-05-04 LAB — TSH: TSH: 2.459 u[IU]/mL (ref 0.350–4.500)

## 2018-05-04 LAB — COMPREHENSIVE METABOLIC PANEL
ALT: 14 U/L (ref 0–44)
AST: 21 U/L (ref 15–41)
Albumin: 3.5 g/dL (ref 3.5–5.0)
Alkaline Phosphatase: 100 U/L (ref 38–126)
Anion gap: 9 (ref 5–15)
BUN: 23 mg/dL (ref 8–23)
CO2: 25 mmol/L (ref 22–32)
Calcium: 8.9 mg/dL (ref 8.9–10.3)
Chloride: 107 mmol/L (ref 98–111)
Creatinine, Ser: 1.66 mg/dL — ABNORMAL HIGH (ref 0.44–1.00)
GFR calc Af Amer: 31 mL/min — ABNORMAL LOW (ref >60)
GFR calc non Af Amer: 27 mL/min — ABNORMAL LOW (ref >60)
Glucose, Bld: 155 mg/dL — ABNORMAL HIGH (ref 70–99)
Potassium: 3.9 mmol/L (ref 3.5–5.1)
Sodium: 141 mmol/L (ref 135–145)
Total Bilirubin: 1.7 mg/dL — ABNORMAL HIGH (ref 0.3–1.2)
Total Protein: 7.3 g/dL (ref 6.5–8.1)

## 2018-05-04 LAB — CBC WITH DIFFERENTIAL/PLATELET
Basophils Absolute: 0.1 10*3/uL (ref 0.0–0.1)
Basophils Relative: 2 %
Eosinophils Absolute: 0.1 10*3/uL (ref 0.0–0.7)
Eosinophils Relative: 2 %
HCT: 39.4 % (ref 36.0–46.0)
Hemoglobin: 12.5 g/dL (ref 12.0–15.0)
Lymphocytes Relative: 17 %
Lymphs Abs: 0.6 10*3/uL — ABNORMAL LOW (ref 0.7–4.0)
MCH: 28.9 pg (ref 26.0–34.0)
MCHC: 31.7 g/dL (ref 30.0–36.0)
MCV: 91 fL (ref 78.0–100.0)
Monocytes Absolute: 0.3 10*3/uL (ref 0.1–1.0)
Monocytes Relative: 8 %
Neutro Abs: 2.5 10*3/uL (ref 1.7–7.7)
Neutrophils Relative %: 71 %
Platelets: 228 10*3/uL (ref 150–400)
RBC: 4.33 MIL/uL (ref 3.87–5.11)
RDW: 15.4 % (ref 11.5–15.5)
WBC: 3.6 10*3/uL — ABNORMAL LOW (ref 4.0–10.5)

## 2018-05-04 MED ORDER — HYDROXYZINE HCL 25 MG PO TABS: 12.5000 mg | ORAL_TABLET | Freq: Three times a day (TID) | ORAL | 0 refills | Status: DC | PRN

## 2018-05-04 MED ORDER — TRIAMCINOLONE ACETONIDE 0.1 % EX CREA: 1.0000 "application " | TOPICAL_CREAM | Freq: Two times a day (BID) | CUTANEOUS | 0 refills | Status: DC

## 2018-05-04 MED ORDER — DIPHENHYDRAMINE HCL 12.5 MG/5ML PO ELIX
25.0000 mg | ORAL_SOLUTION | Freq: Once | ORAL | Status: AC
  Administered 2018-05-04: 25 mg via ORAL
  Filled 2018-05-04: qty 10

## 2018-05-04 NOTE — ED Provider Notes (Signed)
[facility] DEPT Provider Note   CSN: [id number] Arrival date & time: 05/04/18  0940     History   Chief Complaint Chief Complaint  Patient presents with  . Pruritis    HPI [name] [name] is a 84 y.o. female.  HPI   84 year old female with itching.  Diffuse.  Symptom onset about 2 days ago.  May be a mild rash to her right forearm but otherwise has not noticed any other lesions.  No new exposures that she is aware of.  No acute joint pain.  No fevers or chills.  No contacts with similar symptoms.  Past Medical History:  Diagnosis Date  . Acute gastric ulcer without mention of hemorrhage, perforation, or obstruction 2006   EGD  . Anxiety   . Atrial fibrillation (HCC)    flecainide; coumadin  . Breast cancer ([place])    left  . Carotid artery disease ([place])    28/31: RICA 5-17%, LICA 61-60%  //  Carotiud [place] 73/71: RICA 0-62%; LICA 69-48% >> FU 1 year   . Chronic diastolic heart failure (HCC)    a. echo 10/10: mild LVH, EF 55-60%, mild AI, mild MR, mild to mod LAE, mild RVE, severe RAE, mod TR, PASP 53  /  b.  Echo 3/14:  Mild LVH, EF 55%, Tr AI, MAC, mild MR, mod LAE, PASP 31, trivial eff. // c. echo 1/17: EF 55-60%, mild AI, MAC, mild MR, moderate BAE, moderate TR  . Chronic lower back pain   . Diabetic peripheral neuropathy ([place]) 03/09/2015  . Diverticulosis   . Esophagitis, unspecified 2006   EGD  . Hiatal hernia 2006   EGD  . History of nuclear stress test    a. Myoview 2/12: EF 69%, no scar or ischemia  . Hx of adenomatous colonic polyps   . Hypertension   . Mediastinal lymphadenopathy   . Memory difficulty 11/14/2016  . Mitral regurgitation    mild, echo, October, 2010  . Osteopenia   . Presence of permanent cardiac pacemaker   . Pulmonary embolus [facility]) 2006   2006, with DVT  . Pulmonary HTN ([place])    53 mmHg echo, 2010, moderate TR, mild right ventricular enlargement  . Tachy-brady syndrome ([place])    s/p pacer 07/2009  . Thyroid  nodule    non-neoplastic goiter  . Type II diabetes mellitus ([place])   . Venous insufficiency    Chronic    Patient Active Problem List   Diagnosis Date Noted  . Morbid obesity due to excess calories ([place]) complicated by hbp 54/62/7035  . Solitary pulmonary nodule on lung CT 03/21/2017  . Amiodarone toxicity 03/21/2017  . Pacemaker complications 00/93/8182  . CKD (chronic kidney disease), stage IV ([place]) 03/14/2017  . Recurrent falls 01/13/2017  . Cognitive change   . A-fib ([place]) 10/14/2016  . Chronic back pain 07/28/2016  . Diabetes mellitus type 2, noninsulin dependent ([place])   . CRD (chronic renal disease), stage III ([place]) 04/12/2016  . Dyspnea on exertion 04/12/2016  . Hypertensive heart disease 03/28/2016  . Anemia, iron deficiency 10/22/2015  . Diabetic peripheral neuropathy ([place]) 03/09/2015  . Acute on chronic diastolic (congestive) heart failure ([place]) 09/07/2014  . Sick sinus syndrome ([place]) 03/09/2014  . Hypertension 06/21/2013  . Osteopenia   . Pulmonary HTN ([place])   . History of pulmonary embolism   . Carotid artery disease ([place])   . History of breast cancer   . Pacemaker-Medtronic   . Syncope   .  Mitral regurgitation   . Mediastinal lymphadenopathy   . THYROID NODULE, HX OF 07/19/2009    Past Surgical History:  Procedure Laterality Date  . ABDOMINAL HYSTERECTOMY    . ANKLE FRACTURE SURGERY Right   . BACK SURGERY    . BREAST LUMPECTOMY Left   . FRACTURE SURGERY    . INSERT / REPLACE / REMOVE PACEMAKER  06/30/2009   MDT Adalpta L implanted by Dr Olevia Perches  . LUMBAR LAMINECTOMY  1973, 06/22/2011   "no hardware either time"     OB History   None      Home Medications    Prior to Admission medications   Medication Sig Start Date End Date Taking? Authorizing Provider  acetaminophen (TYLENOL) 325 MG tablet Take 650 mg by mouth every 6 (six) hours as needed for mild pain.    [provider]  ALPRAZolam Duanne Moron) 0.25 MG tablet TAKE 1 TABLET TWICE A DAY  AS NEEDED FOR ANXIETY 03/11/18   Hoyt Koch, MD  bethanechol (URECHOLINE) 25 MG tablet Take 25 mg by mouth 2 (two) times daily.    [provider]  blood glucose meter kit and supplies KIT Dispense based on patient and insurance preference. Use before breakfast and at bedtime. Diagnosis code: E11.9 03/26/17   Nche, Charlene Brooke, NP  Fructose-Dextrose-Phosphor Acd (NAUSEA CONTROL PO) Take 1 tablet by mouth as needed (nausea).    [provider]  furosemide (LASIX) 80 MG tablet TAKE 1 TABLET (80 MG TOTAL) BY MOUTH 2 (TWO) TIMES DAILY. 01/13/18   Isaiah Serge, NP  gabapentin (NEURONTIN) 300 MG capsule Take 1 capsule (300 mg total) by mouth 3 (three) times daily. Patient taking differently: Take 300-600 mg by mouth See admin instructions. Taking 2 capsules (635m total) in the AM and 1 capsule (3058m in the PM 02/17/18   CrHoyt KochMD  memantine (NAMENDA) 5 MG tablet TAKE 1 TABLET BY MOUTH TWICE A DAY 01/20/18   MiWard GivensNP  metoprolol succinate (TOPROL-XL) 25 MG 24 hr tablet Take 1 tablet (25 mg total) by mouth daily. Please make yearly appt with Dr. AlRayann Hemanor September for future refills. 1st attempt 01/29/18   AlThompson GrayerMD  potassium chloride 20 MEQ/15ML (10%) SOLN Take 20 mEq by mouth daily as needed (if have not eaten a banana).    [provider]  promethazine (PHENERGAN) 25 MG tablet Take 1 tablet (25 mg total) by mouth every 8 (eight) hours as needed for nausea or vomiting. 10/22/16   CrHoyt KochMD  pyridoxine (B-6) 200 MG tablet Take 200 mg by mouth daily.    [provider]  vitamin B-12 (CYANOCOBALAMIN) 500 MCG tablet Take 500 mcg by mouth daily.     [provider]  warfarin (COUMADIN) 2.5 MG tablet TAKE 1/2 TABLET TO 1 TABLET DAILY OR AS DIRECTED BY COUMADIN CLINIC Patient taking differently: 1.2569my mouth Tues, Thurs, Sat, 2.5mg34mn, Wed, Fri, Sun 01/08/18   NelsDorothy Spark    Family  History Family History  Problem Relation Age of Onset  . Heart disease Mother   . Hypertension Mother   . COPD Father   . Hypertension Father   . Hypertension Sister   . Hypertension Brother   . Hypertension Daughter   . Colon cancer Neg Hx   . Heart attack Neg Hx   . Stroke Neg Hx     Social History Social History   Tobacco Use  . Smoking status: Former  Smoker    Packs/day: 1.00    Years: 47.00    Pack years: 47.00    Types: Cigarettes    Last attempt to quit: 10/26/1998    Years since quitting: 19.5  . Smokeless tobacco: Never Used  Substance Use Topics  . Alcohol use: No    Comment: 10/14/2016 "I'll have a beer q once in awhile"  . Drug use: No     Allergies   Amiodarone; Flecainide; Morphine and related; Bactrim ds [sulfamethoxazole-trimethoprim]; and Cephalexin   Review of Systems Review of Systems  All systems reviewed and negative, other than as noted in HPI.  Physical Exam Updated Vital Signs BP (!) 129/91 (BP Location: Right Arm)   Pulse 61   Temp 97.7 F (36.5 C) (Oral)   Resp 18   Ht 6' (1.829 m)   Wt 106.6 kg (235 lb)   SpO2 98%   BMI 31.87 kg/m   Physical Exam  Constitutional: She appears well-developed and well-nourished. No distress.  HENT:  Head: Normocephalic and atraumatic.  Eyes: Conjunctivae are normal. Right eye exhibits no discharge. Left eye exhibits no discharge.  Neck: Neck supple.  Cardiovascular: Normal rate, regular rhythm and normal heart sounds. Exam reveals no gallop and no friction rub.  No murmur heard. Pulmonary/Chest: Effort normal and breath sounds normal. No respiratory distress.  Abdominal: Soft. She exhibits no distension. There is no tenderness.  Musculoskeletal: She exhibits no edema or tenderness.  Neurological: She is alert.  Skin: Skin is warm and dry.  Superficial linear excoriations to the dorsal aspect of the right forearm.  No discrete lesions noted otherwise.  Psychiatric: She has a normal mood and  affect. Her behavior is normal. Thought content normal.  Nursing note and vitals reviewed.    ED Treatments / Results  Labs (all labs ordered are listed, but only abnormal results are displayed) Labs Reviewed  CBC WITH DIFFERENTIAL/PLATELET - Abnormal; Notable for the following components:      Result Value   WBC 3.6 (*)    Lymphs Abs 0.6 (*)    All other components within normal limits  COMPREHENSIVE METABOLIC PANEL - Abnormal; Notable for the following components:   Glucose, Bld 155 (*)    Creatinine, Ser 1.66 (*)    Total Bilirubin 1.7 (*)    GFR calc non Af Amer 27 (*)    GFR calc Af Amer 31 (*)    All other components within normal limits  TSH    EKG None  Radiology No results found.  Procedures Procedures (including critical care time)  Medications Ordered in ED Medications  diphenhydrAMINE (BENADRYL) 12.5 MG/5ML elixir 25 mg (has no administration in time range)     Initial Impression / Assessment and Plan / ED Course  I have reviewed the triage vital signs and the nursing notes.  Pertinent labs & imaging results that were available during my care of the patient were reviewed by me and considered in my medical decision making (see chart for details).     84 year old female with diffuse itching of unclear etiology.  No new exposures that she is aware.  She is not significantly uremic.  LFTs are okay.  Plan symptomatic treatment at this time.  Final Clinical Impressions(s) / ED Diagnoses   Final diagnoses:  Itching    ED Discharge Orders    None       Virgel Manifold, MD 05/07/18 [phone]

## 2018-05-04 NOTE — ED Triage Notes (Signed)
Itching X 2 days

## 2018-05-04 NOTE — ED Notes (Signed)
Bed: WA04 Expected date:  Expected time:  Means of arrival:  Comments: 84 yo female; itching

## 2018-05-08 ENCOUNTER — Encounter: Payer: Self-pay | Admitting: Internal Medicine

## 2018-05-08 ENCOUNTER — Ambulatory Visit (INDEPENDENT_AMBULATORY_CARE_PROVIDER_SITE_OTHER): Payer: Medicare HMO | Admitting: Internal Medicine

## 2018-05-08 VITALS — BP 104/70 | HR 78 | Temp 97.5°F | Ht 72.0 in | Wt 241.0 lb

## 2018-05-08 DIAGNOSIS — L299 Pruritus, unspecified: Secondary | ICD-10-CM

## 2018-05-08 DIAGNOSIS — R300 Dysuria: Secondary | ICD-10-CM | POA: Diagnosis not present

## 2018-05-08 DIAGNOSIS — R0609 Other forms of dyspnea: Secondary | ICD-10-CM | POA: Diagnosis not present

## 2018-05-08 LAB — POCT URINALYSIS DIPSTICK
Bilirubin, UA: NEGATIVE
Glucose, UA: NEGATIVE
KETONES UA: NEGATIVE
NITRITE UA: POSITIVE
PH UA: 7.5 (ref 5.0–8.0)
PROTEIN UA: NEGATIVE
RBC UA: NEGATIVE
SPEC GRAV UA: 1.01 (ref 1.010–1.025)
UROBILINOGEN UA: 0.2 U/dL

## 2018-05-08 MED ORDER — NITROFURANTOIN MONOHYD MACRO 100 MG PO CAPS: 100.0000 mg | ORAL_CAPSULE | Freq: Two times a day (BID) | ORAL | 0 refills | Status: DC

## 2018-05-08 MED ORDER — HYDROXYZINE HCL 25 MG PO TABS: 12.5000 mg | ORAL_TABLET | Freq: Three times a day (TID) | ORAL | 0 refills | Status: DC | PRN

## 2018-05-08 MED ORDER — TRIAMCINOLONE ACETONIDE 0.1 % EX CREA: 1.0000 "application " | TOPICAL_CREAM | Freq: Two times a day (BID) | CUTANEOUS | 2 refills | Status: DC

## 2018-05-08 NOTE — Assessment & Plan Note (Signed)
Rx for triamcinolone cream and hydroxyzine today. Advised to avoid scratching and heat. Labs done in the ER without etiology. Will give it another 1-2 weeks and if persistent can consider further evaluation. No noted rash on exam.

## 2018-05-08 NOTE — Assessment & Plan Note (Signed)
Rx for macrobid today, U/A done in the office consistent with infection.

## 2018-05-08 NOTE — Progress Notes (Signed)
   Subjective:    Patient ID: [name] [name], female    DOB: 1931/04/11, 84 y.o.   MRN: [id number]  HPI The patient is an 84 YO female coming in for several concerns including possible uti (has had many, some increased frequency, some burning with going, started a few weeks ago, overall stable since onset), and itching (all over but especially back and arms, using cream on it which is helping, is taking hydroxyzine in the evening which does seem to help a little, has been itching on her arms more due to not being able to reach her back) and SOB (taking lasix twice a day still, denies change in diet, has been eating a lot more popsicles lately, denies chest pains, had ER visit about 1 month ago with episode of likely A fib with rvr stopped without medication, they have not seen cardiologist since that time).   Review of Systems  Constitutional: Positive for activity change and appetite change.  HENT: Negative.   Eyes: Negative.   Respiratory: Positive for shortness of breath. Negative for cough and chest tightness.   Cardiovascular: Negative for chest pain, palpitations and leg swelling.  Gastrointestinal: Positive for nausea. Negative for abdominal distention, abdominal pain, constipation, diarrhea and vomiting.  Genitourinary: Positive for dysuria, frequency and urgency.  Musculoskeletal: Negative.   Skin: Negative.        itching  Neurological: Negative.   Psychiatric/Behavioral: Negative.       Objective:   Physical Exam  Constitutional: She is oriented to person, place, and time. She appears well-developed and well-nourished.  HENT:  Head: Normocephalic and atraumatic.  Eyes: EOM are normal.  Neck: Normal range of motion.  Cardiovascular: Normal rate.  irreg irreg  Pulmonary/Chest: Effort normal and breath sounds normal. No respiratory distress. She has no wheezes. She has no rales.  Abdominal: Soft. Bowel sounds are normal. She exhibits no distension. There is no tenderness.  There is no rebound.  Musculoskeletal: She exhibits edema. She exhibits no tenderness.  2+ edema bilaterally  Neurological: She is alert and oriented to person, place, and time. Coordination normal.  Skin: Skin is warm and dry.  Some stigmata of scratching on the right arm in particular.   Psychiatric: She has a normal mood and affect.   Vitals:   05/08/18 1533  BP: 104/70  Pulse: 78  Temp: (!) 97.5 F (36.4 C)  TempSrc: Oral  SpO2: 94%  Weight: 241 lb (109.3 kg)  Height: 6' (1.829 m)      Assessment & Plan:

## 2018-05-08 NOTE — Assessment & Plan Note (Signed)
Could be related to recent A fib with rvr, she is in a fib today without rvr and she is asked to call cardiology and have them view device data to see if she is having a lot of high hr episodes. Treating for infection today.

## 2018-05-08 NOTE — Patient Instructions (Signed)
We have sent in the antibiotic for the urine infection called macrobid to take 1 pill twice a day for 1 week.  I have sent in a refill of the cream and the hydroxyzine. This hydroxyzine is like benadryl and can be taken for itching. This can make you sleepy, drowsy like benadryl can so should be taken with caution.   Try not to scratch and avoid hot shower or bath.

## 2018-05-11 ENCOUNTER — Other Ambulatory Visit: Payer: Self-pay

## 2018-05-11 ENCOUNTER — Observation Stay (HOSPITAL_COMMUNITY): Payer: Medicare HMO

## 2018-05-11 ENCOUNTER — Encounter (HOSPITAL_COMMUNITY): Payer: Self-pay

## 2018-05-11 ENCOUNTER — Telehealth: Payer: Self-pay | Admitting: Internal Medicine

## 2018-05-11 ENCOUNTER — Emergency Department (HOSPITAL_COMMUNITY): Payer: Medicare HMO

## 2018-05-11 ENCOUNTER — Observation Stay (HOSPITAL_COMMUNITY)
Admission: EM | Admit: 2018-05-11 | Discharge: 2018-05-14 | Disposition: A | Discharge status: Home / Self Care | Payer: Medicare HMO | Attending: Internal Medicine | Admitting: Internal Medicine

## 2018-05-11 DIAGNOSIS — R4182 Altered mental status, unspecified: Principal | ICD-10-CM | POA: Insufficient documentation

## 2018-05-11 DIAGNOSIS — I495 Sick sinus syndrome: Secondary | ICD-10-CM | POA: Diagnosis not present

## 2018-05-11 DIAGNOSIS — I083 Combined rheumatic disorders of mitral, aortic and tricuspid valves: Secondary | ICD-10-CM | POA: Insufficient documentation

## 2018-05-11 DIAGNOSIS — M8580 Other specified disorders of bone density and structure, unspecified site: Secondary | ICD-10-CM | POA: Insufficient documentation

## 2018-05-11 DIAGNOSIS — E041 Nontoxic single thyroid nodule: Secondary | ICD-10-CM | POA: Insufficient documentation

## 2018-05-11 DIAGNOSIS — F0390 Unspecified dementia without behavioral disturbance: Secondary | ICD-10-CM | POA: Diagnosis not present

## 2018-05-11 DIAGNOSIS — Z90710 Acquired absence of both cervix and uterus: Secondary | ICD-10-CM | POA: Insufficient documentation

## 2018-05-11 DIAGNOSIS — I872 Venous insufficiency (chronic) (peripheral): Secondary | ICD-10-CM | POA: Insufficient documentation

## 2018-05-11 DIAGNOSIS — Z981 Arthrodesis status: Secondary | ICD-10-CM | POA: Insufficient documentation

## 2018-05-11 DIAGNOSIS — Z86718 Personal history of other venous thrombosis and embolism: Secondary | ICD-10-CM | POA: Insufficient documentation

## 2018-05-11 DIAGNOSIS — I272 Pulmonary hypertension, unspecified: Secondary | ICD-10-CM | POA: Diagnosis not present

## 2018-05-11 DIAGNOSIS — I4891 Unspecified atrial fibrillation: Secondary | ICD-10-CM | POA: Diagnosis present

## 2018-05-11 DIAGNOSIS — Z79899 Other long term (current) drug therapy: Secondary | ICD-10-CM | POA: Diagnosis not present

## 2018-05-11 DIAGNOSIS — Z9889 Other specified postprocedural states: Secondary | ICD-10-CM | POA: Insufficient documentation

## 2018-05-11 DIAGNOSIS — N390 Urinary tract infection, site not specified: Secondary | ICD-10-CM | POA: Diagnosis not present

## 2018-05-11 DIAGNOSIS — E1142 Type 2 diabetes mellitus with diabetic polyneuropathy: Secondary | ICD-10-CM | POA: Insufficient documentation

## 2018-05-11 DIAGNOSIS — Z8249 Family history of ischemic heart disease and other diseases of the circulatory system: Secondary | ICD-10-CM | POA: Insufficient documentation

## 2018-05-11 DIAGNOSIS — E1151 Type 2 diabetes mellitus with diabetic peripheral angiopathy without gangrene: Secondary | ICD-10-CM | POA: Diagnosis not present

## 2018-05-11 DIAGNOSIS — G319 Degenerative disease of nervous system, unspecified: Secondary | ICD-10-CM | POA: Diagnosis not present

## 2018-05-11 DIAGNOSIS — I509 Heart failure, unspecified: Secondary | ICD-10-CM | POA: Diagnosis not present

## 2018-05-11 DIAGNOSIS — Z885 Allergy status to narcotic agent status: Secondary | ICD-10-CM | POA: Insufficient documentation

## 2018-05-11 DIAGNOSIS — Z950 Presence of cardiac pacemaker: Secondary | ICD-10-CM | POA: Insufficient documentation

## 2018-05-11 DIAGNOSIS — Z881 Allergy status to other antibiotic agents status: Secondary | ICD-10-CM | POA: Insufficient documentation

## 2018-05-11 DIAGNOSIS — I480 Paroxysmal atrial fibrillation: Secondary | ICD-10-CM | POA: Diagnosis not present

## 2018-05-11 DIAGNOSIS — I6782 Cerebral ischemia: Secondary | ICD-10-CM | POA: Diagnosis not present

## 2018-05-11 DIAGNOSIS — I420 Dilated cardiomyopathy: Secondary | ICD-10-CM | POA: Insufficient documentation

## 2018-05-11 DIAGNOSIS — I5033 Acute on chronic diastolic (congestive) heart failure: Secondary | ICD-10-CM | POA: Insufficient documentation

## 2018-05-11 DIAGNOSIS — Z91018 Allergy to other foods: Secondary | ICD-10-CM | POA: Insufficient documentation

## 2018-05-11 DIAGNOSIS — I130 Hypertensive heart and chronic kidney disease with heart failure and stage 1 through stage 4 chronic kidney disease, or unspecified chronic kidney disease: Secondary | ICD-10-CM | POA: Insufficient documentation

## 2018-05-11 DIAGNOSIS — E1122 Type 2 diabetes mellitus with diabetic chronic kidney disease: Secondary | ICD-10-CM | POA: Insufficient documentation

## 2018-05-11 DIAGNOSIS — I700 Atherosclerosis of aorta: Secondary | ICD-10-CM | POA: Diagnosis not present

## 2018-05-11 DIAGNOSIS — N184 Chronic kidney disease, stage 4 (severe): Secondary | ICD-10-CM | POA: Insufficient documentation

## 2018-05-11 DIAGNOSIS — Z888 Allergy status to other drugs, medicaments and biological substances status: Secondary | ICD-10-CM | POA: Insufficient documentation

## 2018-05-11 DIAGNOSIS — Z87891 Personal history of nicotine dependence: Secondary | ICD-10-CM | POA: Insufficient documentation

## 2018-05-11 DIAGNOSIS — E785 Hyperlipidemia, unspecified: Secondary | ICD-10-CM | POA: Diagnosis not present

## 2018-05-11 DIAGNOSIS — Z86010 Personal history of colonic polyps: Secondary | ICD-10-CM | POA: Insufficient documentation

## 2018-05-11 DIAGNOSIS — Z6831 Body mass index (BMI) 31.0-31.9, adult: Secondary | ICD-10-CM | POA: Insufficient documentation

## 2018-05-11 DIAGNOSIS — Z7901 Long term (current) use of anticoagulants: Secondary | ICD-10-CM | POA: Insufficient documentation

## 2018-05-11 DIAGNOSIS — E119 Type 2 diabetes mellitus without complications: Secondary | ICD-10-CM

## 2018-05-11 DIAGNOSIS — Z882 Allergy status to sulfonamides status: Secondary | ICD-10-CM | POA: Insufficient documentation

## 2018-05-11 DIAGNOSIS — Z86711 Personal history of pulmonary embolism: Secondary | ICD-10-CM | POA: Diagnosis present

## 2018-05-11 DIAGNOSIS — Z853 Personal history of malignant neoplasm of breast: Secondary | ICD-10-CM | POA: Insufficient documentation

## 2018-05-11 LAB — TROPONIN I: Troponin I: <0.03 ng/mL (ref <0.03)

## 2018-05-11 LAB — HEPATIC FUNCTION PANEL
ALBUMIN: 3.4 g/dL — AB (ref 3.5–5.0)
ALK PHOS: 102 U/L (ref 38–126)
ALT: 14 U/L (ref 0–44)
AST: 22 U/L (ref 15–41)
BILIRUBIN TOTAL: 1.7 mg/dL — AB (ref 0.3–1.2)
Bilirubin, Direct: 0.3 mg/dL — ABNORMAL HIGH (ref 0.0–0.2)
Indirect Bilirubin: 1.4 mg/dL — ABNORMAL HIGH (ref 0.3–0.9)
TOTAL PROTEIN: 7.6 g/dL (ref 6.5–8.1)

## 2018-05-11 LAB — CBC
HEMATOCRIT: 41.7 % (ref 36.0–46.0)
HEMOGLOBIN: 12.6 g/dL (ref 12.0–15.0)
MCH: 28 pg (ref 26.0–34.0)
MCHC: 30.2 g/dL (ref 30.0–36.0)
MCV: 92.7 fL (ref 78.0–100.0)
Platelets: 234 10*3/uL (ref 150–400)
RBC: 4.5 MIL/uL (ref 3.87–5.11)
RDW: 15.2 % (ref 11.5–15.5)
WBC: 4.4 10*3/uL (ref 4.0–10.5)

## 2018-05-11 LAB — BASIC METABOLIC PANEL
ANION GAP: 13 (ref 5–15)
BUN: 19 mg/dL (ref 8–23)
CHLORIDE: 105 mmol/L (ref 98–111)
CO2: 23 mmol/L (ref 22–32)
CREATININE: 1.84 mg/dL — AB (ref 0.44–1.00)
Calcium: 8.9 mg/dL (ref 8.9–10.3)
GFR calc non Af Amer: 24 mL/min — ABNORMAL LOW (ref >60)
GFR, EST AFRICAN AMERICAN: 28 mL/min — AB (ref >60)
Glucose, Bld: 194 mg/dL — ABNORMAL HIGH (ref 70–99)
POTASSIUM: 3.7 mmol/L (ref 3.5–5.1)
SODIUM: 141 mmol/L (ref 135–145)

## 2018-05-11 LAB — URINALYSIS, ROUTINE W REFLEX MICROSCOPIC
Bilirubin Urine: NEGATIVE
GLUCOSE, UA: NEGATIVE mg/dL
KETONES UR: NEGATIVE mg/dL
NITRITE: NEGATIVE
Protein, ur: NEGATIVE mg/dL
Specific Gravity, Urine: 1.006 (ref 1.005–1.030)
pH: 7 (ref 5.0–8.0)

## 2018-05-11 LAB — AMMONIA: Ammonia: 36 umol/L — ABNORMAL HIGH (ref 9–35)

## 2018-05-11 LAB — BRAIN NATRIURETIC PEPTIDE: B Natriuretic Peptide: 581.6 pg/mL — ABNORMAL HIGH (ref 0.0–100.0)

## 2018-05-11 LAB — GLUCOSE, CAPILLARY: Glucose-Capillary: 159 mg/dL — ABNORMAL HIGH (ref 70–99)

## 2018-05-11 LAB — SEDIMENTATION RATE: Sed Rate: 29 mm/hr — ABNORMAL HIGH (ref 0–22)

## 2018-05-11 LAB — PROTIME-INR
INR: 2.14
Prothrombin Time: 23.8 seconds — ABNORMAL HIGH (ref 11.4–15.2)

## 2018-05-11 LAB — VITAMIN B12: Vitamin B-12: 3076 pg/mL — ABNORMAL HIGH (ref 180–914)

## 2018-05-11 LAB — TSH: TSH: 1.906 u[IU]/mL (ref 0.350–4.500)

## 2018-05-11 MED ORDER — FUROSEMIDE 10 MG/ML IJ SOLN
40.0000 mg | Freq: Once | INTRAMUSCULAR | Status: AC
  Administered 2018-05-11: 40 mg via INTRAVENOUS
  Filled 2018-05-11: qty 4

## 2018-05-11 MED ORDER — VITAMIN B-12 1000 MCG PO TABS
500.0000 ug | ORAL_TABLET | Freq: Every day | ORAL | Status: DC
  Administered 2018-05-12 – 2018-05-14 (×3): 500 ug via ORAL
  Filled 2018-05-11 (×3): qty 1

## 2018-05-11 MED ORDER — ACETAMINOPHEN 325 MG PO TABS: 650.0000 mg | ORAL_TABLET | Freq: Four times a day (QID) | ORAL | Status: DC | PRN

## 2018-05-11 MED ORDER — VITAMIN B-6 100 MG PO TABS
200.0000 mg | ORAL_TABLET | Freq: Every day | ORAL | Status: DC
  Administered 2018-05-12 – 2018-05-14 (×3): 200 mg via ORAL
  Filled 2018-05-11 (×3): qty 2

## 2018-05-11 MED ORDER — MEMANTINE HCL 5 MG PO TABS
5.0000 mg | ORAL_TABLET | Freq: Two times a day (BID) | ORAL | Status: DC
  Administered 2018-05-11 – 2018-05-14 (×6): 5 mg via ORAL
  Filled 2018-05-11 (×8): qty 1

## 2018-05-11 MED ORDER — HYDROCORTISONE 1 % EX LOTN
TOPICAL_LOTION | Freq: Every day | CUTANEOUS | Status: DC | PRN
  Filled 2018-05-11 (×2): qty 118

## 2018-05-11 MED ORDER — DEXTROSE-FRUCTOSE-SOD CITRATE 968-175-230 MG PO CHEW: 1.0000 | CHEWABLE_TABLET | ORAL | Status: DC | PRN

## 2018-05-11 MED ORDER — SODIUM CHLORIDE 0.9 % IV SOLN: 250.0000 mL | INTRAVENOUS | Status: DC | PRN

## 2018-05-11 MED ORDER — SODIUM CHLORIDE 0.9% FLUSH: 3.0000 mL | INTRAVENOUS | Status: DC | PRN

## 2018-05-11 MED ORDER — ACETAMINOPHEN 650 MG RE SUPP: 650.0000 mg | Freq: Four times a day (QID) | RECTAL | Status: DC | PRN

## 2018-05-11 MED ORDER — SODIUM CHLORIDE 0.9% FLUSH
3.0000 mL | Freq: Two times a day (BID) | INTRAVENOUS | Status: DC
  Administered 2018-05-11 – 2018-05-14 (×5): 3 mL via INTRAVENOUS

## 2018-05-11 MED ORDER — GABAPENTIN 300 MG PO CAPS
600.0000 mg | ORAL_CAPSULE | Freq: Every day | ORAL | Status: DC
  Administered 2018-05-12 – 2018-05-14 (×3): 600 mg via ORAL
  Filled 2018-05-11 (×3): qty 2

## 2018-05-11 MED ORDER — WARFARIN - PHARMACIST DOSING INPATIENT
Freq: Every day | Status: DC
  Administered 2018-05-12 – 2018-05-13 (×2)

## 2018-05-11 MED ORDER — FUROSEMIDE 20 MG PO TABS
80.0000 mg | ORAL_TABLET | Freq: Two times a day (BID) | ORAL | Status: DC
  Administered 2018-05-11: 80 mg via ORAL
  Filled 2018-05-11: qty 4

## 2018-05-11 MED ORDER — GABAPENTIN 300 MG PO CAPS
300.0000 mg | ORAL_CAPSULE | Freq: Every day | ORAL | Status: DC
  Administered 2018-05-11 – 2018-05-13 (×3): 300 mg via ORAL
  Filled 2018-05-11 (×3): qty 1

## 2018-05-11 MED ORDER — PROMETHAZINE HCL 25 MG PO TABS
25.0000 mg | ORAL_TABLET | Freq: Three times a day (TID) | ORAL | Status: DC | PRN
Start: 2018-05-11 — End: 2018-05-14

## 2018-05-11 MED ORDER — BETHANECHOL CHLORIDE 25 MG PO TABS
25.0000 mg | ORAL_TABLET | Freq: Two times a day (BID) | ORAL | Status: DC
  Administered 2018-05-11 – 2018-05-14 (×6): 25 mg via ORAL
  Filled 2018-05-11 (×7): qty 1

## 2018-05-11 MED ORDER — GABAPENTIN 300 MG PO CAPS
300.0000 mg | ORAL_CAPSULE | ORAL | Status: DC
Start: 2018-05-11 — End: 2018-05-11

## 2018-05-11 MED ORDER — INSULIN ASPART 100 UNIT/ML ~~LOC~~ SOLN
0.0000 [IU] | SUBCUTANEOUS | Status: DC
  Administered 2018-05-11: 2 [IU] via SUBCUTANEOUS
  Administered 2018-05-12 (×2): 1 [IU] via SUBCUTANEOUS

## 2018-05-11 MED ORDER — METOPROLOL SUCCINATE ER 25 MG PO TB24
25.0000 mg | ORAL_TABLET | Freq: Every day | ORAL | Status: DC
  Administered 2018-05-12: 25 mg via ORAL
  Filled 2018-05-11: qty 1

## 2018-05-11 MED ORDER — SODIUM CHLORIDE 0.9 % IV SOLN
1.0000 g | Freq: Once | INTRAVENOUS | Status: AC
  Administered 2018-05-11: 1 g via INTRAVENOUS
  Filled 2018-05-11: qty 10

## 2018-05-11 NOTE — Progress Notes (Signed)
Patient complaining of itching on back. Hydrocortisone cream ordered per standing order. Will continue to monitor patient.

## 2018-05-11 NOTE — H&P (Addendum)
TRH H&P   Patient Demographics:    [name] [name], is a 84 y.o. female  MRN: [id number]   DOB - 04-23-1931  Admit Date - 05/11/2018  Outpatient Primary MD for the patient is Hoyt Koch, MD  Referring MD/NP/PA:   Julianne Rice  Outpatient Specialists:     Patient coming from:   home  Chief Complaint  Patient presents with  . Weakness  . Atrial Fibrillation      HPI:    [name] [name]  is a 84 y.o. female, w gerd, PUD, hypertension, hyperlipidemia, dm2, mitral regurgitation, Pafib, chronic diastolic CHF, apparently presents with altered mental status and noted to have UTI and mild afib with RVR. Pt denies fever, chills, cough, cp, palp, sob, n/v, diarrhea, dysuria, hematuria.  Pt recently tx for uti.   In ED,   Na 141, K 3.7, Bun 19, Creatinine 1.84 Wbc 4.4, Hgb 12.6, Plt 234 INR 2.14 Ast 22, Alt 14 Trop <0.03 BNP 581.6  Urinalysis wbc 6-10,   CXR IMPRESSION: Chronic cardiomegaly and aortic atherosclerosis. Pacemaker. No active process otherwise.   CT brain IMPRESSION: Atrophy with extensive supratentorial small vessel disease. No acute infarct evident. No mass or hemorrhage.  There are foci of arterial vascular calcification. Mucosal thickening noted in several ethmoid air cells.   Ekg afib at 117, nl axis,   Pt will be admitted for altered mental status secondary to UTI, and brief episode of Afib with RVR    Review of systems:    In addition to the HPI above,  No Fever-chills, No Headache, No changes with Vision or hearing, No problems swallowing food or Liquids, No Chest pain, Cough or Shortness of Breath, No Abdominal pain, No Nausea or Vommitting, Bowel movements are regular, No Blood in stool or Urine, No dysuria, No new skin rashes or bruises, No new joints pains-aches,  No new weakness, tingling, numbness in any  extremity, No recent weight gain or loss, No polyuria, polydypsia or polyphagia, No significant Mental Stressors.  A full 10 point Review of Systems was done, except as stated above, all other Review of Systems were negative.   With Past History of the following :    Past Medical History:  Diagnosis Date  . Acute gastric ulcer without mention of hemorrhage, perforation, or obstruction 2006   EGD  . Anxiety   . Atrial fibrillation (HCC)    flecainide; coumadin  . Breast cancer ([place])    left  . Carotid artery disease ([place])    62/26: RICA 3-33%, LICA 54-56%  //  Carotiud [place] 25/63: RICA 8-93%; LICA 73-42% >> FU 1 year   . Chronic diastolic heart failure (HCC)    a. echo 10/10: mild LVH, EF 55-60%, mild AI, mild MR, mild to mod LAE, mild RVE, severe RAE, mod TR, PASP 53  /  b.  Echo 3/14:  Mild LVH, EF  55%, Tr AI, MAC, mild MR, mod LAE, PASP 31, trivial eff. // c. echo 1/17: EF 55-60%, mild AI, MAC, mild MR, moderate BAE, moderate TR  . Chronic lower back pain   . Diabetic peripheral neuropathy ([place]) 03/09/2015  . Diverticulosis   . Esophagitis, unspecified 2006   EGD  . Hiatal hernia 2006   EGD  . History of nuclear stress test    a. Myoview 2/12: EF 69%, no scar or ischemia  . Hx of adenomatous colonic polyps   . Hypertension   . Mediastinal lymphadenopathy   . Memory difficulty 11/14/2016  . Mitral regurgitation    mild, echo, October, 2010  . Osteopenia   . Presence of permanent cardiac pacemaker   . Pulmonary embolus [facility]) 2006   2006, with DVT  . Pulmonary HTN ([place])    53 mmHg echo, 2010, moderate TR, mild right ventricular enlargement  . Tachy-brady syndrome ([place])    s/p pacer 07/2009  . Thyroid nodule    non-neoplastic goiter  . Type II diabetes mellitus ([place])   . Venous insufficiency    Chronic      Past Surgical History:  Procedure Laterality Date  . ABDOMINAL HYSTERECTOMY    . ANKLE FRACTURE SURGERY Right   . BACK SURGERY    . BREAST LUMPECTOMY Left   .  FRACTURE SURGERY    . INSERT / REPLACE / REMOVE PACEMAKER  06/30/2009   MDT Adalpta L implanted by Dr Olevia Perches  . LUMBAR LAMINECTOMY  1973, 06/22/2011   "no hardware either time"      Social History:     Social History   Tobacco Use  . Smoking status: Former Smoker    Packs/day: 1.00    Years: 47.00    Pack years: 47.00    Types: Cigarettes    Last attempt to quit: 10/26/1998    Years since quitting: 19.5  . Smokeless tobacco: Never Used  Substance Use Topics  . Alcohol use: No    Comment: 10/14/2016 "I'll have a beer q once in awhile"     Lives - at home  Mobility - walks by self   Family History :     Family History  Problem Relation Age of Onset  . Heart disease Mother   . Hypertension Mother   . COPD Father   . Hypertension Father   . Hypertension Sister   . Hypertension Brother   . Hypertension Daughter   . Colon cancer Neg Hx   . Heart attack Neg Hx   . Stroke Neg Hx        Home Medications:   Prior to Admission medications   Medication Sig Start Date End Date Taking? Authorizing Provider  acetaminophen (TYLENOL) 325 MG tablet Take 650 mg by mouth every 6 (six) hours as needed for mild pain.   Yes [provider]  ALPRAZolam (XANAX) 0.25 MG tablet TAKE 1 TABLET TWICE A DAY AS NEEDED FOR ANXIETY Patient taking differently: Take 0.25 mg by mouth 2 (two) times daily as needed for anxiety.  03/11/18  Yes Hoyt Koch, MD  bethanechol (URECHOLINE) 25 MG tablet Take 25 mg by mouth 2 (two) times daily.   Yes [provider]  Dextrose-Fructose-Sod Citrate Almon Hercules) [phone] MG CHEW Chew 1 tablet by mouth as needed (for nausea).   Yes [provider]  furosemide (LASIX) 80 MG tablet TAKE 1 TABLET (80 MG TOTAL) BY MOUTH 2 (TWO) TIMES DAILY. 01/13/18  Yes Isaiah Serge, NP  gabapentin (NEURONTIN) 300 MG capsule Take 1 capsule (300 mg total) by mouth 3 (three) times daily. Patient taking differently: Take 300-600 mg by mouth See  admin instructions. Take 600 mg by mouth in the morning and 300 mg in the evening 02/17/18  Yes Hoyt Koch, MD  hydrOXYzine (ATARAX/VISTARIL) 25 MG tablet Take 0.5-1 tablets (12.5-25 mg total) by mouth every 8 (eight) hours as needed for itching. 05/08/18  Yes Hoyt Koch, MD  memantine (NAMENDA) 5 MG tablet TAKE 1 TABLET BY MOUTH TWICE A DAY 01/20/18  Yes Ward Givens, NP  metoprolol succinate (TOPROL-XL) 25 MG 24 hr tablet Take 1 tablet (25 mg total) by mouth daily. Please make yearly appt with Dr. Rayann Heman for September for future refills. 1st attempt Patient taking differently: Take 25 mg by mouth daily.  01/29/18  Yes Allred, Jeneen Rinks, MD  nitrofurantoin, macrocrystal-monohydrate, (MACROBID) 100 MG capsule Take 1 capsule (100 mg total) by mouth 2 (two) times daily. 05/08/18  Yes Hoyt Koch, MD  potassium chloride 20 MEQ/15ML (10%) SOLN Take 20 mEq by mouth daily as needed (if have not eaten a banana).   Yes [provider]  promethazine (PHENERGAN) 25 MG tablet Take 1 tablet (25 mg total) by mouth every 8 (eight) hours as needed for nausea or vomiting. 10/22/16  Yes Hoyt Koch, MD  pyridoxine (B-6) 200 MG tablet Take 200 mg by mouth daily.   Yes [provider]  triamcinolone cream (KENALOG) 0.1 % Apply 1 application topically 2 (two) times daily. 05/08/18  Yes Hoyt Koch, MD  vitamin B-12 (CYANOCOBALAMIN) 500 MCG tablet Take 500 mcg by mouth daily.    Yes [provider]  warfarin (COUMADIN) 2.5 MG tablet TAKE 1/2 TABLET TO 1 TABLET DAILY OR AS DIRECTED BY COUMADIN CLINIC Patient taking differently: Take 1.25-2.5 mg by mouth See admin instructions. Take 2.5 mg by mouth in the morning on Sunday/Monday/Wednesday/Friday and 1.25 mg on Tuesday/Thursday/Saturday 01/08/18  Yes Dorothy Spark, MD  blood glucose meter kit and supplies KIT Dispense based on patient and insurance preference. Use before breakfast and at  bedtime. Diagnosis code: E11.9 03/26/17   Nche, Charlene Brooke, NP     Allergies:     Allergies  Allergen Reactions  . Amiodarone Shortness Of Breath and Nausea Only    "Toxicity," also  . Flecainide Shortness Of Breath and Other (See Comments)    "Toxicity," also   . Morphine And Related Nausea And Vomiting and Other (See Comments)    Family requests no morphine due to past reaction. Pt stated it made her sick, severe nausea and vomiting  . Bactrim Ds [Sulfamethoxazole-Trimethoprim] Diarrhea  . Cephalexin Nausea And Vomiting  . Pineapple Itching    Severe itching     Physical Exam:   Vitals  Blood pressure 115/76, pulse 89, temperature 98.9 F (37.2 C), temperature source Oral, resp. rate (!) 25, SpO2 93 %.   1. General  lying in bed in NAD,   2. Normal affect and insight, Not Suicidal or Homicidal, Awake Alert, Oriented X 2, person, place.  3. No F.N deficits, ALL C.Nerves Intact, Strength 5/5 all 4 extremities, Sensation intact all 4 extremities, Plantars down going.  4. Ears and Eyes appear Normal, Conjunctivae clear, PERRLA. Moist Oral Mucosa.  5. Supple Neck, No JVD, No cervical lymphadenopathy appriciated, No Carotid Bruits.  6. Symmetrical Chest wall movement, Good air movement bilaterally, CTAB.  7. Irr, irr, s1, s2, 2/6 sem apex  8. Positive Bowel  Sounds, Abdomen Soft, No tenderness, No organomegaly appriciated,No rebound -guarding or rigidity.  9.  No Cyanosis, Normal Skin Turgor, No Skin Rash or Bruise.  10. Good muscle tone,  joints appear normal , no effusions, Normal ROM.  11. No Palpable Lymph Nodes in Neck or Axillae      Data Review:    CBC Recent Labs  Lab 05/11/18 1238  WBC 4.4  HGB 12.6  HCT 41.7  PLT 234  MCV 92.7  MCH 28.0  MCHC 30.2  RDW 15.2   ------------------------------------------------------------------------------------------------------------------  Chemistries  Recent Labs  Lab 05/11/18 1238  NA 141  K 3.7   CL 105  CO2 23  GLUCOSE 194*  BUN 19  CREATININE 1.84*  CALCIUM 8.9  AST 22  ALT 14  ALKPHOS 102  BILITOT 1.7*   ------------------------------------------------------------------------------------------------------------------ estimated creatinine clearance is 30.9 mL/min (A) (by C-G formula based on SCr of 1.84 mg/dL (H)). ------------------------------------------------------------------------------------------------------------------ No results for input(s): TSH, T4TOTAL, T3FREE, THYROIDAB in the last 72 hours.  Invalid input(s): FREET3  Coagulation profile Recent Labs  Lab 05/11/18 1238  INR 2.14   ------------------------------------------------------------------------------------------------------------------- No results for input(s): DDIMER in the last 72 hours. -------------------------------------------------------------------------------------------------------------------  Cardiac Enzymes Recent Labs  Lab 05/11/18 1238  TROPONINI <0.03   ------------------------------------------------------------------------------------------------------------------    Component Value Date/Time   BNP 581.6 (H) 05/11/2018 1238   BNP 607.5 (H) 09/27/2015 1500     ---------------------------------------------------------------------------------------------------------------  Urinalysis    Component Value Date/Time   COLORURINE YELLOW 05/11/2018 [place] 05/11/2018 1605   APPEARANCEUR Clear 01/06/2017 1601   LABSPEC 1.006 05/11/2018 1605   LABSPEC 1.008 11/30/2011 0748   PHURINE 7.0 05/11/2018 [place] 05/11/2018 1605   GLUCOSEU NEGATIVE 10/22/2016 1628   HGBUR SMALL (A) 05/11/2018 1605   BILIRUBINUR NEGATIVE 05/11/2018 1605   BILIRUBINUR Neg 05/08/2018 1611   BILIRUBINUR Negative 01/06/2017 1601   BILIRUBINUR Negative 11/30/2011 0748   KETONESUR NEGATIVE 05/11/2018 1605   PROTEINUR NEGATIVE 05/11/2018 1605   UROBILINOGEN 0.2  05/08/2018 1611   UROBILINOGEN 0.2 10/22/2016 1628   NITRITE NEGATIVE 05/11/2018 1605   LEUKOCYTESUR SMALL (A) 05/11/2018 1605   LEUKOCYTESUR Negative 01/06/2017 1601   LEUKOCYTESUR 3+ 11/30/2011 0748    ----------------------------------------------------------------------------------------------------------------   Imaging Results:    Dg Chest 2 View  Result Date: 05/11/2018 CLINICAL DATA:  Congestive heart failure.  Atrial fibrillation. EXAM: CHEST - 2 VIEW COMPARISON:  03/31/2018 FINDINGS: Chronic cardiomegaly. Chronic aortic atherosclerosis. Pacemaker leads appear the same. Pulmonary vascularity within normal limits. No interstitial or alveolar edema. No visible effusion. No significant bone finding. IMPRESSION: Chronic cardiomegaly and aortic atherosclerosis. Pacemaker. No active process otherwise. Electronically Signed   By: Nelson Chimes M.D.   On: 05/11/2018 13:32   Ct Head Wo Contrast  Result Date: 05/11/2018 CLINICAL DATA:  Increased lethargy with altered mental status EXAM: CT HEAD WITHOUT CONTRAST TECHNIQUE: Contiguous axial images were obtained from the base of the skull through the vertex without intravenous contrast. COMPARISON:  March 14, 2017 FINDINGS: Brain: Mild to moderate diffuse atrophy is stable. There is no intracranial mass, hemorrhage, extra-axial fluid collection, or midline shift. There is small vessel disease throughout much of the centra semiovale bilaterally. Small vessel disease is noted in each external capsule anteriorly. There is small vessel disease in the left thalamus. No acute infarct is evident. Vascular: There is no hyperdense vessel. There is calcification in each carotid siphon region, somewhat more on the left than on the right. Skull: The bony calvarium  appears intact. Sinuses/Orbits: There is mucosal thickening in several ethmoid air cells bilaterally. Other visualized paranasal sinuses are clear. Orbits appear symmetric bilaterally. Other: Visualized  mastoid air cells are clear. IMPRESSION: Atrophy with extensive supratentorial small vessel disease. No acute infarct evident. No mass or hemorrhage. There are foci of arterial vascular calcification. Mucosal thickening noted in several ethmoid air cells. Electronically Signed   By: Lowella Grip III M.D.   On: 05/11/2018 13:56      Assessment & Plan:    Principal Problem:   Altered mental status Active Problems:   History of pulmonary embolism   Diabetes mellitus type 2, noninsulin dependent (HCC)   A-fib (HCC)   Acute lower UTI    AMS on baseline dementia Check b12, folate, esr, ana, rpr, tsh , ammonia MRI brain HOLD xanax  tx UTI with rocephin 1gm iv qday  Afib with RVR, resolved Tele Trop I q6h x3 Check TSH Cont coumadin Cont metoprolol 60m po qday  Chronic diastolic CHF Cont Lasix 829WKpo bid Cont Metoprolol 232mpo qday  H/o PE Coumadin pharmacy to dose  Dm2 fsbs ac and qhs, ISS  Dm2 neuropathy Cont gabapentin 3006mo tid  Urinary retention Continue bethanechol 62m40m bid   DVT Prophylaxis Coumadin pharmacy to dose  AM Labs Ordered, also please review Full Orders  Family Communication: Admission, patients condition and plan of care including tests being ordered have been discussed with the patient  who indicate understanding and agree with the plan and Code Status.  Code Status  FULL CODE  Likely DC to  home  Condition GUARDED    Consults called: none  Admission status: observation   Time spent in minutes : 60   JameJani Gravel on 05/11/2018 at 7:13 PM  Between 7am to 7pm - Pager - [phone] 7pm go to [email or web address] - password [email or web address] - Office  [phone]

## 2018-05-11 NOTE — ED Notes (Signed)
Patient transported to X-ray 

## 2018-05-11 NOTE — Progress Notes (Signed)
Pt has MR unsafe Pacemaker generator. Unable to scan. RN aware.

## 2018-05-11 NOTE — Progress Notes (Signed)
MRI cannot be completed per MRI tech. Patient's pacemaker is not safe for MRI.

## 2018-05-11 NOTE — ED Notes (Signed)
Pt c/o pain while attempting to urinate. Purewick in place, no urine captured as of yet. edp notified.

## 2018-05-11 NOTE — Telephone Encounter (Signed)
Spoke with Dr. Radford Pax, DOD she said that the patient should be evaluated in the ED. Called the patient's daughter and she said she would go to the ED with her.

## 2018-05-11 NOTE — ED Triage Notes (Signed)
Pt sent over by PCP for complaint of weakness, SOB, possible afib. Pt lethargic in triage, dozing off but answers questions appropriately History of afib and recently diagnosed with UTI.

## 2018-05-11 NOTE — Telephone Encounter (Signed)
New Message    Pt c/o Shortness Of Breath: STAT if SOB developed within the last 24 hours or pt is noticeably SOB on the phone  1. Are you currently SOB (can you hear that pt is SOB on the phone)? No, spoke with daughter.   2. How long have you been experiencing SOB? For about 3 weeks.  3. Are you SOB when sitting or when up moving around? Sitting or moving   4. Are you currently experiencing any other symptoms? No  Patients daughter called to advise that the patient has sob. She did recently see her PCP who advised the patient to make an appt with her cardiologist because when they listened they felt she may be in afib. Please call to discuss.

## 2018-05-11 NOTE — Telephone Encounter (Signed)
Patient's daughter was calling, per DPR, the patient has been SOB, fatigued and nauseous for a month. The symptoms have worsened and now the patient will experience difficultly breathing when she is relaxed in a chair. The patient weighs 241 lbs and used to be around 250 lb. The patient has swelling in her ankles, but no where else. Went to primary care office on Friday and her PCP was concerned about afib. Heard irregular rhythm upon assessment, advised getting an appointment with [facility]. Patients blood pressure at PCP appointment was 104/70. Advised the patient if symptoms worsen to call back.

## 2018-05-11 NOTE — ED Notes (Signed)
Pt expressed pain with urination. Bladder scan showed >958mL. EDP notified.

## 2018-05-11 NOTE — Progress Notes (Signed)
ANTICOAGULATION CONSULT NOTE - Initial Consult  Pharmacy Consult for coumadin Indication: atrial fibrillation  Allergies  Allergen Reactions  . Amiodarone Shortness Of Breath and Nausea Only    "Toxicity," also  . Flecainide Shortness Of Breath and Other (See Comments)    "Toxicity," also   . Morphine And Related Nausea And Vomiting and Other (See Comments)    Family requests no morphine due to past reaction. Pt stated it made her sick, severe nausea and vomiting  . Bactrim Ds [Sulfamethoxazole-Trimethoprim] Diarrhea  . Cephalexin Nausea And Vomiting  . Pineapple Itching    Severe itching    Patient Measurements:   Vital Signs: Temp: 98.7 F (37.1 C) (08/12 1925) Temp Source: Oral (08/12 1213) BP: 115/76 (08/12 1745) Pulse Rate: 89 (08/12 1745)  Labs: Recent Labs    05/11/18 1238  HGB 12.6  HCT 41.7  PLT 234  LABPROT 23.8*  INR 2.14  CREATININE 1.84*  TROPONINI <0.03    Estimated Creatinine Clearance: 30.9 mL/min (A) (by C-G formula based on SCr of 1.84 mg/dL (H)).   Medical History: Past Medical History:  Diagnosis Date  . Acute gastric ulcer without mention of hemorrhage, perforation, or obstruction 2006   EGD  . Anxiety   . Atrial fibrillation (HCC)    flecainide; coumadin  . Breast cancer ([place])    left  . Carotid artery disease ([place])    28/63: RICA 8-17%, LICA 71-16%  //  Carotiud [place] 57/90: RICA 3-83%; LICA 33-83% >> FU 1 year   . Chronic diastolic heart failure (HCC)    a. echo 10/10: mild LVH, EF 55-60%, mild AI, mild MR, mild to mod LAE, mild RVE, severe RAE, mod TR, PASP 53  /  b.  Echo 3/14:  Mild LVH, EF 55%, Tr AI, MAC, mild MR, mod LAE, PASP 31, trivial eff. // c. echo 1/17: EF 55-60%, mild AI, MAC, mild MR, moderate BAE, moderate TR  . Chronic lower back pain   . Diabetic peripheral neuropathy ([place]) 03/09/2015  . Diverticulosis   . Esophagitis, unspecified 2006   EGD  . Hiatal hernia 2006   EGD  . History of nuclear stress test    a.  Myoview 2/12: EF 69%, no scar or ischemia  . Hx of adenomatous colonic polyps   . Hypertension   . Mediastinal lymphadenopathy   . Memory difficulty 11/14/2016  . Mitral regurgitation    mild, echo, October, 2010  . Osteopenia   . Presence of permanent cardiac pacemaker   . Pulmonary embolus [facility]) 2006   2006, with DVT  . Pulmonary HTN ([place])    53 mmHg echo, 2010, moderate TR, mild right ventricular enlargement  . Tachy-brady syndrome ([place])    s/p pacer 07/2009  . Thyroid nodule    non-neoplastic goiter  . Type II diabetes mellitus ([place])   . Venous insufficiency    Chronic   Assessment: [name] [name] is an 84 year old female presenting to [facility] ED with altered mental status, noted to have UTI and mild afib with RVR. Patient on coumadin PTA regimen: 2.31m every Su/M/W/F and 1.233mon T/T/Sa. CBC WNL. No s/sx of bleeding noted.  Last dose of warfarin 8/12 prior to admission, as patient takes in the morning. INR today therapeutic at 2.14  Goal of Therapy:  INR 2-3 Monitor platelets by anticoagulation protocol: Yes   Plan:  No need for extra dose tonight, as patient currently therapeutic. Daily INR, CBC, monitor for s/sx of bleeding  Thank you  for involving pharmacy in this patient's care.  Janae Bridgeman, PharmD PGY1 Pharmacy Resident Phone: [phone] 05/11/2018 7:43 PM

## 2018-05-11 NOTE — ED Provider Notes (Signed)
[facility] EMERGENCY DEPARTMENT Provider Note   CSN: [id number] Arrival date & time: 05/11/18  1158     History   Chief Complaint Chief Complaint  Patient presents with  . Weakness  . Atrial Fibrillation    HPI [name] [name] is a 84 y.o. female.  HPI Patient presents with her daughter.  She is drowsy and a poor historian.  Level 5 caveat. patient lives alone.  Per daughter, patient has had increasing dyspnea especially with minor exertion.  She has been complaining of itching especially to her back for the past month.  She also has recently been diagnosed with a urinary tract infection.  She is currently taking Macrobid.  Patient has been increasingly lethargic per daughter.  Has been taking Atarax for her itching.  States she took a dose this morning. Past Medical History:  Diagnosis Date  . Acute gastric ulcer without mention of hemorrhage, perforation, or obstruction 2006   EGD  . Anxiety   . Atrial fibrillation (HCC)    flecainide; coumadin  . Breast cancer ([place])    left  . Carotid artery disease ([place])    80/16: RICA 5-53%, LICA 74-82%  //  [place] 70/78: RICA 6-75%; LICA 44-92% >> FU 1 year   . Chronic diastolic heart failure (HCC)    a. echo 10/10: mild LVH, EF 55-60%, mild AI, mild MR, mild to mod LAE, mild RVE, severe RAE, mod TR, PASP 53  /  b.  Echo 3/14:  Mild LVH, EF 55%, Tr AI, MAC, mild MR, mod LAE, PASP 31, trivial eff. // c. echo 1/17: EF 55-60%, mild AI, MAC, mild MR, moderate BAE, moderate TR  . Chronic lower back pain   . Diabetic peripheral neuropathy ([place]) 03/09/2015  . Diverticulosis   . Esophagitis, unspecified 2006   EGD  . Hiatal hernia 2006   EGD  . History of nuclear stress test    a. Myoview 2/12: EF 69%, no scar or ischemia  . Hx of adenomatous colonic polyps   . Hypertension   . Mediastinal lymphadenopathy   . Memory difficulty 11/14/2016  . Mitral regurgitation    mild, echo, October, 2010  . Osteopenia   .  Presence of permanent cardiac pacemaker   . Pulmonary embolus [facility]) 2006   2006, with DVT  . Pulmonary HTN ([place])    53 mmHg echo, 2010, moderate TR, mild right ventricular enlargement  . Tachy-brady syndrome ([place])    s/p pacer 07/2009  . Thyroid nodule    non-neoplastic goiter  . Type II diabetes mellitus ([place])   . Venous insufficiency    Chronic    Patient Active Problem List   Diagnosis Date Noted  . Pruritus 05/08/2018  . Morbid obesity due to excess calories ([place]) complicated by hbp 01/00/7121  . Solitary pulmonary nodule on lung CT 03/21/2017  . Amiodarone toxicity 03/21/2017  . Pacemaker complications 97/58/8325  . CKD (chronic kidney disease), stage IV ([place]) 03/14/2017  . Recurrent falls 01/13/2017  . Cognitive change   . A-fib ([place]) 10/14/2016  . Dysuria 10/10/2016  . Chronic back pain 07/28/2016  . Diabetes mellitus type 2, noninsulin dependent ([place])   . CRD (chronic renal disease), stage III ([place]) 04/12/2016  . Dyspnea on exertion 04/12/2016  . Hypertensive heart disease 03/28/2016  . Anemia, iron deficiency 10/22/2015  . Diabetic peripheral neuropathy ([place]) 03/09/2015  . Acute on chronic diastolic (congestive) heart failure ([place]) 09/07/2014  . Sick sinus syndrome ([place]) 03/09/2014  .  Hypertension 06/21/2013  . Osteopenia   . Pulmonary HTN ([place])   . History of pulmonary embolism   . Carotid artery disease ([place])   . History of breast cancer   . Pacemaker-Medtronic   . Syncope   . Mitral regurgitation   . Mediastinal lymphadenopathy   . THYROID NODULE, HX OF 07/19/2009    Past Surgical History:  Procedure Laterality Date  . ABDOMINAL HYSTERECTOMY    . ANKLE FRACTURE SURGERY Right   . BACK SURGERY    . BREAST LUMPECTOMY Left   . FRACTURE SURGERY    . INSERT / REPLACE / REMOVE PACEMAKER  06/30/2009   MDT Adalpta L implanted by Dr Olevia Perches  . LUMBAR LAMINECTOMY  1973, 06/22/2011   "no hardware either time"     OB History   None      Home  Medications    Prior to Admission medications   Medication Sig Start Date End Date Taking? Authorizing Provider  acetaminophen (TYLENOL) 325 MG tablet Take 650 mg by mouth every 6 (six) hours as needed for mild pain.   Yes [provider]  ALPRAZolam (XANAX) 0.25 MG tablet TAKE 1 TABLET TWICE A DAY AS NEEDED FOR ANXIETY Patient taking differently: Take 0.25 mg by mouth 2 (two) times daily as needed for anxiety.  03/11/18  Yes Hoyt Koch, MD  bethanechol (URECHOLINE) 25 MG tablet Take 25 mg by mouth 2 (two) times daily.   Yes [provider]  Dextrose-Fructose-Sod Citrate Almon Hercules) [phone] MG CHEW Chew 1 tablet by mouth as needed (for nausea).   Yes [provider]  furosemide (LASIX) 80 MG tablet TAKE 1 TABLET (80 MG TOTAL) BY MOUTH 2 (TWO) TIMES DAILY. 01/13/18  Yes Isaiah Serge, NP  gabapentin (NEURONTIN) 300 MG capsule Take 1 capsule (300 mg total) by mouth 3 (three) times daily. Patient taking differently: Take 300-600 mg by mouth See admin instructions. Take 600 mg by mouth in the morning and 300 mg in the evening 02/17/18  Yes Hoyt Koch, MD  hydrOXYzine (ATARAX/VISTARIL) 25 MG tablet Take 0.5-1 tablets (12.5-25 mg total) by mouth every 8 (eight) hours as needed for itching. 05/08/18  Yes Hoyt Koch, MD  memantine (NAMENDA) 5 MG tablet TAKE 1 TABLET BY MOUTH TWICE A DAY 01/20/18  Yes Ward Givens, NP  metoprolol succinate (TOPROL-XL) 25 MG 24 hr tablet Take 1 tablet (25 mg total) by mouth daily. Please make yearly appt with Dr. Rayann Heman for September for future refills. 1st attempt Patient taking differently: Take 25 mg by mouth daily.  01/29/18  Yes Allred, Jeneen Rinks, MD  nitrofurantoin, macrocrystal-monohydrate, (MACROBID) 100 MG capsule Take 1 capsule (100 mg total) by mouth 2 (two) times daily. 05/08/18  Yes Hoyt Koch, MD  potassium chloride 20 MEQ/15ML (10%) SOLN Take 20 mEq by mouth daily as needed (if have not eaten a  banana).   Yes [provider]  promethazine (PHENERGAN) 25 MG tablet Take 1 tablet (25 mg total) by mouth every 8 (eight) hours as needed for nausea or vomiting. 10/22/16  Yes Hoyt Koch, MD  pyridoxine (B-6) 200 MG tablet Take 200 mg by mouth daily.   Yes [provider]  triamcinolone cream (KENALOG) 0.1 % Apply 1 application topically 2 (two) times daily. 05/08/18  Yes Hoyt Koch, MD  vitamin B-12 (CYANOCOBALAMIN) 500 MCG tablet Take 500 mcg by mouth daily.    Yes [provider]  warfarin (COUMADIN) 2.5 MG tablet TAKE 1/2 TABLET  TO 1 TABLET DAILY OR AS DIRECTED BY COUMADIN CLINIC Patient taking differently: Take 1.25-2.5 mg by mouth See admin instructions. Take 2.5 mg by mouth in the morning on Sunday/Monday/Wednesday/Friday and 1.25 mg on Tuesday/Thursday/Saturday 01/08/18  Yes Dorothy Spark, MD  blood glucose meter kit and supplies KIT Dispense based on patient and insurance preference. Use before breakfast and at bedtime. Diagnosis code: E11.9 03/26/17   Nche, Charlene Brooke, NP    Family History Family History  Problem Relation Age of Onset  . Heart disease Mother   . Hypertension Mother   . COPD Father   . Hypertension Father   . Hypertension Sister   . Hypertension Brother   . Hypertension Daughter   . Colon cancer Neg Hx   . Heart attack Neg Hx   . Stroke Neg Hx     Social History Social History   Tobacco Use  . Smoking status: Former Smoker    Packs/day: 1.00    Years: 47.00    Pack years: 47.00    Types: Cigarettes    Last attempt to quit: 10/26/1998    Years since quitting: 19.5  . Smokeless tobacco: Never Used  Substance Use Topics  . Alcohol use: No    Comment: 10/14/2016 "I'll have a beer q once in awhile"  . Drug use: No     Allergies   Amiodarone; Flecainide; Morphine and related; Bactrim ds [sulfamethoxazole-trimethoprim]; Cephalexin; and Pineapple   Review of Systems Review of Systems  Unable to  perform ROS: Mental status change     Physical Exam Updated Vital Signs BP 115/76   Pulse 89   Temp 98.9 F (37.2 C) (Oral)   Resp (!) 25   SpO2 93%   Physical Exam  Constitutional: She appears well-developed and well-nourished. No distress.  HENT:  Head: Normocephalic and atraumatic.  Mouth/Throat: Oropharynx is clear and moist.  No obvious head trauma.  Eyes: Pupils are equal, round, and reactive to light. EOM are normal.  Neck: Normal range of motion. Neck supple.  No posterior midline cervical tenderness to palpation.  Cardiovascular:  Irregularly irregular.  Pulmonary/Chest: Effort normal. No stridor. No respiratory distress. She has no wheezes. She has no rales. She exhibits no tenderness.  No respiratory distress.  Diminished breath sounds in bilateral bases.  Abdominal: Soft. Bowel sounds are normal. There is no tenderness. There is no rebound and no guarding.  Musculoskeletal: Normal range of motion. She exhibits no edema or tenderness.  2+ bilateral lower extremity edema.  Distal pulses intact.  Neurological:  Patient is drowsy but arousable.  Appears to move all extremities without focal deficit.  Sensation grossly intact.  Skin: Skin is warm and dry. Capillary refill takes less than 2 seconds. No rash noted. She is not diaphoretic. No erythema.  Psychiatric: She has a normal mood and affect. Her behavior is normal.  Nursing note and vitals reviewed.    ED Treatments / Results  Labs (all labs ordered are listed, but only abnormal results are displayed) Labs Reviewed  BASIC METABOLIC PANEL - Abnormal; Notable for the following components:      Result Value   Glucose, Bld 194 (*)    Creatinine, Ser 1.84 (*)    GFR calc non Af Amer 24 (*)    GFR calc Af Amer 28 (*)    All other components within normal limits  PROTIME-INR - Abnormal; Notable for the following components:   Prothrombin Time 23.8 (*)    All other components within  normal limits  HEPATIC  FUNCTION PANEL - Abnormal; Notable for the following components:   Albumin 3.4 (*)    Total Bilirubin 1.7 (*)    Bilirubin, Direct 0.3 (*)    Indirect Bilirubin 1.4 (*)    All other components within normal limits  BRAIN NATRIURETIC PEPTIDE - Abnormal; Notable for the following components:   B Natriuretic Peptide 581.6 (*)    All other components within normal limits  URINALYSIS, ROUTINE W REFLEX MICROSCOPIC - Abnormal; Notable for the following components:   Hgb urine dipstick SMALL (*)    Leukocytes, UA SMALL (*)    Bacteria, UA RARE (*)    All other components within normal limits  URINE CULTURE  CBC  TROPONIN I    EKG EKG Interpretation  Date/Time:  Monday May 11 2018 12:10:02 EDT Ventricular Rate:  117 PR Interval:    QRS Duration: 84 QT Interval:  344 QTC Calculation: 479 R Axis:   81 Text Interpretation:  Atrial fibrillation with rapid ventricular response Abnormal ECG Confirmed by Julianne Rice [phone]) on 05/11/2018 1:28:25 PM   Radiology Dg Chest 2 View  Result Date: 05/11/2018 CLINICAL DATA:  Congestive heart failure.  Atrial fibrillation. EXAM: CHEST - 2 VIEW COMPARISON:  03/31/2018 FINDINGS: Chronic cardiomegaly. Chronic aortic atherosclerosis. Pacemaker leads appear the same. Pulmonary vascularity within normal limits. No interstitial or alveolar edema. No visible effusion. No significant bone finding. IMPRESSION: Chronic cardiomegaly and aortic atherosclerosis. Pacemaker. No active process otherwise. Electronically Signed   By: Nelson Chimes M.D.   On: 05/11/2018 13:32   Ct Head Wo Contrast  Result Date: 05/11/2018 CLINICAL DATA:  Increased lethargy with altered mental status EXAM: CT HEAD WITHOUT CONTRAST TECHNIQUE: Contiguous axial images were obtained from the base of the skull through the vertex without intravenous contrast. COMPARISON:  March 14, 2017 FINDINGS: Brain: Mild to moderate diffuse atrophy is stable. There is no intracranial mass, hemorrhage,  extra-axial fluid collection, or midline shift. There is small vessel disease throughout much of the centra semiovale bilaterally. Small vessel disease is noted in each external capsule anteriorly. There is small vessel disease in the left thalamus. No acute infarct is evident. Vascular: There is no hyperdense vessel. There is calcification in each carotid siphon region, somewhat more on the left than on the right. Skull: The bony calvarium appears intact. Sinuses/Orbits: There is mucosal thickening in several ethmoid air cells bilaterally. Other visualized paranasal sinuses are clear. Orbits appear symmetric bilaterally. Other: Visualized mastoid air cells are clear. IMPRESSION: Atrophy with extensive supratentorial small vessel disease. No acute infarct evident. No mass or hemorrhage. There are foci of arterial vascular calcification. Mucosal thickening noted in several ethmoid air cells. Electronically Signed   By: Lowella Grip III M.D.   On: 05/11/2018 13:56    Procedures Procedures (including critical care time)  Medications Ordered in ED Medications  cefTRIAXone (ROCEPHIN) 1 g in sodium chloride 0.9 % 100 mL IVPB (has no administration in time range)  furosemide (LASIX) injection 40 mg (40 mg Intravenous Given 05/11/18 1436)     Initial Impression / Assessment and Plan / ED Course  I have reviewed the triage vital signs and the nursing notes.  Pertinent labs & imaging results that were available during my care of the patient were reviewed by me and considered in my medical decision making (see chart for details).     Patient still appears drowsy but is more alert.  Mild slurring of words.  Bladder scan with greater than 1  L of urine and Foley catheter was placed.  Question partially treated UTI.  Urine was sent for culture and given dose of IV Rocephin.  CT head without acute findings.  Discussed with hospitalist will see patient in the emergency department and admit for altered mental  status which is likely multifactorial.  Final Clinical Impressions(s) / ED Diagnoses   Final diagnoses:  Atrial fibrillation with rapid ventricular response ([place])  Polypharmacy  Altered mental status, unspecified altered mental status type    ED Discharge Orders    None       Julianne Rice, MD 05/11/18 1909

## 2018-05-11 NOTE — ED Notes (Signed)
Pt returned from CT °

## 2018-05-12 DIAGNOSIS — Z7901 Long term (current) use of anticoagulants: Secondary | ICD-10-CM | POA: Diagnosis not present

## 2018-05-12 DIAGNOSIS — I5033 Acute on chronic diastolic (congestive) heart failure: Secondary | ICD-10-CM | POA: Diagnosis not present

## 2018-05-12 DIAGNOSIS — I4891 Unspecified atrial fibrillation: Secondary | ICD-10-CM | POA: Diagnosis not present

## 2018-05-12 DIAGNOSIS — R4182 Altered mental status, unspecified: Secondary | ICD-10-CM

## 2018-05-12 DIAGNOSIS — E119 Type 2 diabetes mellitus without complications: Secondary | ICD-10-CM

## 2018-05-12 DIAGNOSIS — I480 Paroxysmal atrial fibrillation: Secondary | ICD-10-CM | POA: Diagnosis not present

## 2018-05-12 DIAGNOSIS — Z86711 Personal history of pulmonary embolism: Secondary | ICD-10-CM | POA: Diagnosis not present

## 2018-05-12 DIAGNOSIS — I481 Persistent atrial fibrillation: Secondary | ICD-10-CM | POA: Diagnosis not present

## 2018-05-12 LAB — COMPREHENSIVE METABOLIC PANEL
ALT: 13 U/L (ref 0–44)
ANION GAP: 12 (ref 5–15)
AST: 18 U/L (ref 15–41)
Albumin: 3.1 g/dL — ABNORMAL LOW (ref 3.5–5.0)
Alkaline Phosphatase: 91 U/L (ref 38–126)
BUN: 19 mg/dL (ref 8–23)
CHLORIDE: 103 mmol/L (ref 98–111)
CO2: 27 mmol/L (ref 22–32)
Calcium: 9 mg/dL (ref 8.9–10.3)
Creatinine, Ser: 1.73 mg/dL — ABNORMAL HIGH (ref 0.44–1.00)
GFR calc non Af Amer: 26 mL/min — ABNORMAL LOW (ref >60)
GFR, EST AFRICAN AMERICAN: 30 mL/min — AB (ref >60)
Glucose, Bld: 119 mg/dL — ABNORMAL HIGH (ref 70–99)
POTASSIUM: 3.6 mmol/L (ref 3.5–5.1)
SODIUM: 142 mmol/L (ref 135–145)
Total Bilirubin: 1.5 mg/dL — ABNORMAL HIGH (ref 0.3–1.2)
Total Protein: 6.6 g/dL (ref 6.5–8.1)

## 2018-05-12 LAB — URINE CULTURE: Culture: NO GROWTH

## 2018-05-12 LAB — CBC
HEMATOCRIT: 38.4 % (ref 36.0–46.0)
HEMOGLOBIN: 11.8 g/dL — AB (ref 12.0–15.0)
MCH: 27.8 pg (ref 26.0–34.0)
MCHC: 30.7 g/dL (ref 30.0–36.0)
MCV: 90.4 fL (ref 78.0–100.0)
Platelets: 219 10*3/uL (ref 150–400)
RBC: 4.25 MIL/uL (ref 3.87–5.11)
RDW: 15.2 % (ref 11.5–15.5)
WBC: 4.7 10*3/uL (ref 4.0–10.5)

## 2018-05-12 LAB — GLUCOSE, CAPILLARY
GLUCOSE-CAPILLARY: 150 mg/dL — AB (ref 70–99)
GLUCOSE-CAPILLARY: 110 mg/dL — AB (ref 70–99)
GLUCOSE-CAPILLARY: 129 mg/dL — AB (ref 70–99)
Glucose-Capillary: 104 mg/dL — ABNORMAL HIGH (ref 70–99)
Glucose-Capillary: 108 mg/dL — ABNORMAL HIGH (ref 70–99)
Glucose-Capillary: 160 mg/dL — ABNORMAL HIGH (ref 70–99)

## 2018-05-12 LAB — TROPONIN I
Troponin I: <0.03 ng/mL (ref <0.03)
Troponin I: <0.03 ng/mL (ref <0.03)

## 2018-05-12 LAB — PROTIME-INR
INR: 2.15
PROTHROMBIN TIME: 23.8 s — AB (ref 11.4–15.2)

## 2018-05-12 LAB — RPR: RPR Ser Ql: NONREACTIVE

## 2018-05-12 IMAGING — DX DG CHEST 1V PORT
1 series · 1 of 1 positions shown · non-contrast
Comparison: 12/28/2016 chest radiograph.

CLINICAL DATA: Dyspnea

EXAM:
PORTABLE CHEST 1 VIEW

[chest ap]
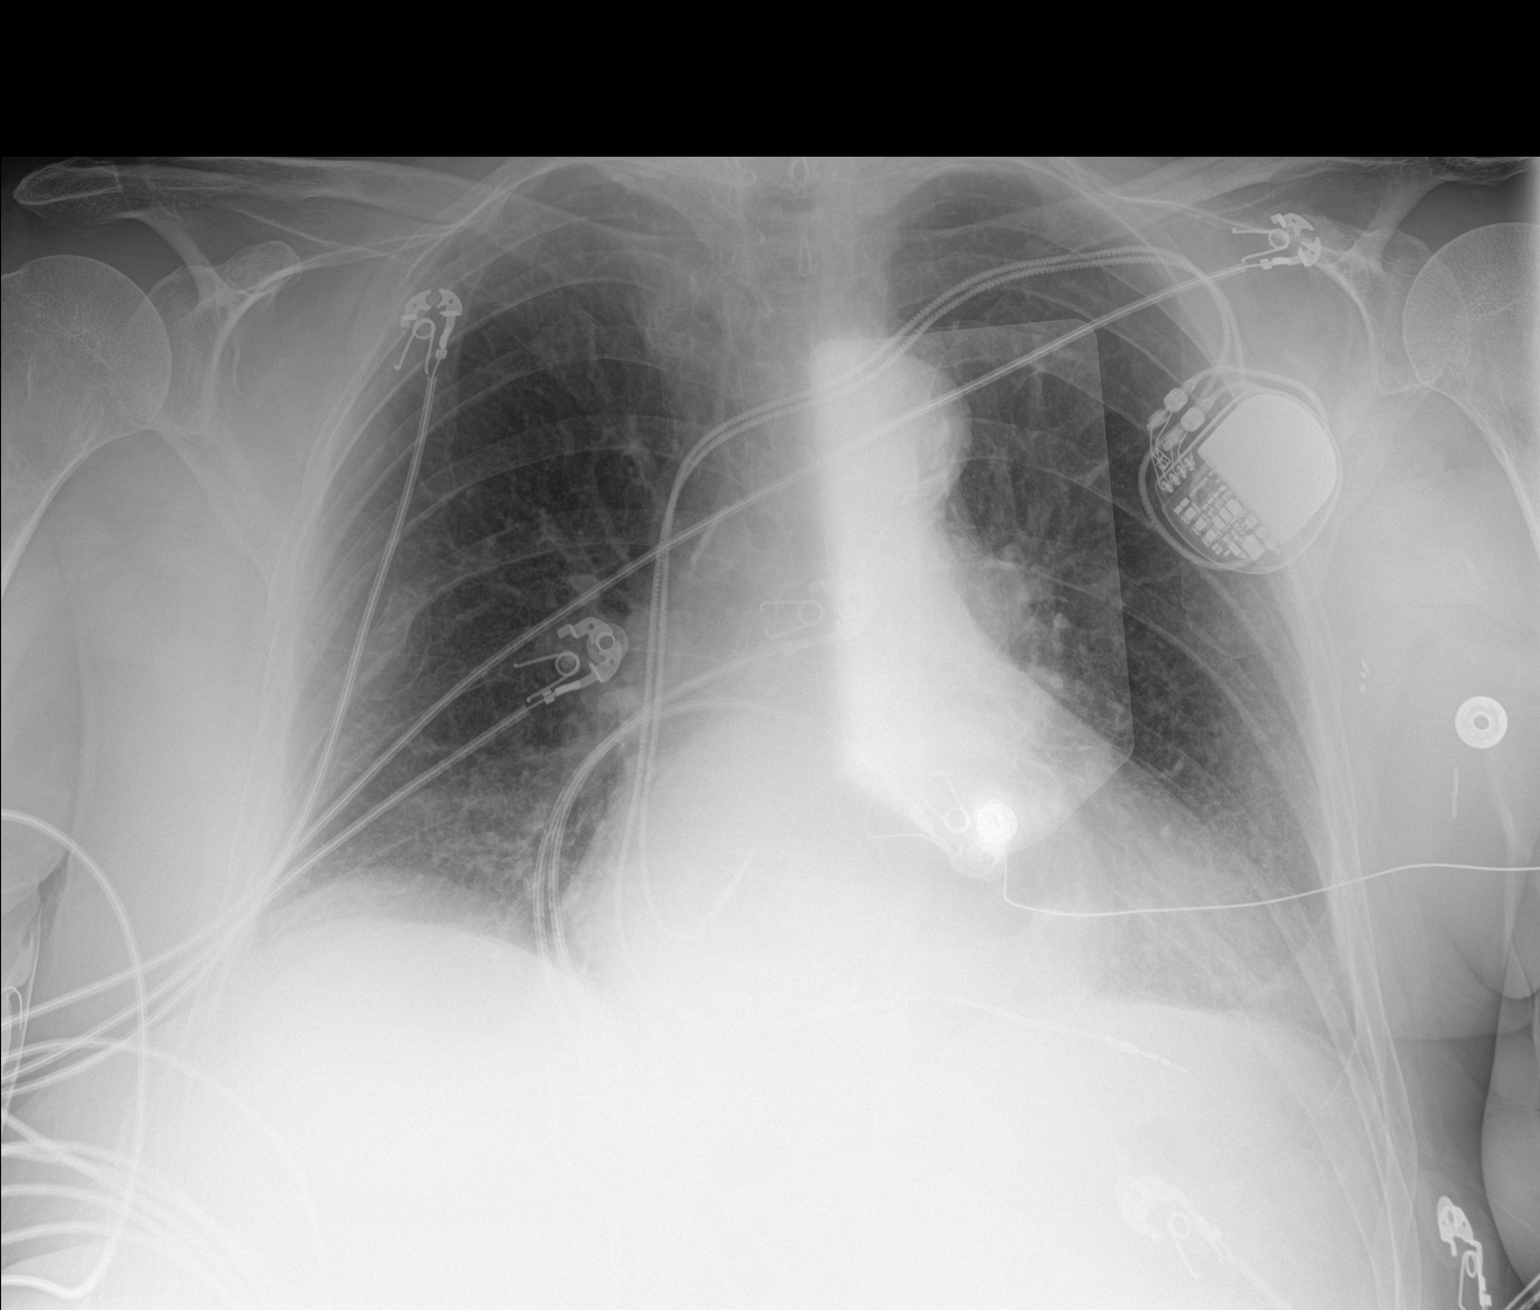

[1 of 1 positions shown; findings below may reference images not displayed]

FINDINGS: Stable configuration of 2 lead left subclavian pacemaker. Pacer pad
overlies the left chest. Stable cardiomediastinal silhouette with
mild cardiomegaly, moderate to large hiatal hernia and aortic
atherosclerosis. No pneumothorax. Trace bilateral pleural effusions.
No overt pulmonary edema. Stable compressive atelectasis in the
medial lower lobes. No acute consolidative airspace disease.
IMPRESSION: 1. Stable cardiomegaly without overt pulmonary edema.
2. Stable moderate to large hiatal hernia with associated
compressive atelectasis in the medial lower lobes.
3. No acute pulmonary disease.

## 2018-05-12 MED ORDER — FUROSEMIDE 10 MG/ML IJ SOLN
60.0000 mg | Freq: Two times a day (BID) | INTRAMUSCULAR | Status: DC
  Administered 2018-05-12 – 2018-05-13 (×3): 60 mg via INTRAVENOUS
  Filled 2018-05-12 (×3): qty 6

## 2018-05-12 MED ORDER — METOPROLOL SUCCINATE ER 50 MG PO TB24
50.0000 mg | ORAL_TABLET | Freq: Every day | ORAL | Status: DC
  Administered 2018-05-13: 50 mg via ORAL
  Filled 2018-05-12: qty 1

## 2018-05-12 MED ORDER — INSULIN ASPART 100 UNIT/ML ~~LOC~~ SOLN: 0.0000 [IU] | Freq: Every day | SUBCUTANEOUS | Status: DC

## 2018-05-12 MED ORDER — WARFARIN SODIUM 2.5 MG PO TABS
2.5000 mg | ORAL_TABLET | ORAL | Status: DC
  Administered 2018-05-13: 2.5 mg via ORAL
  Filled 2018-05-12: qty 1

## 2018-05-12 MED ORDER — HYDROCORTISONE 1 % EX CREA
TOPICAL_CREAM | Freq: Every day | CUTANEOUS | Status: DC | PRN
  Administered 2018-05-12 – 2018-05-14 (×2): 1 via TOPICAL
  Filled 2018-05-12: qty 28

## 2018-05-12 MED ORDER — SODIUM CHLORIDE 0.9 % IV SOLN
1.0000 g | Freq: Every day | INTRAVENOUS | Status: DC
  Administered 2018-05-12 – 2018-05-13 (×2): 1 g via INTRAVENOUS
  Filled 2018-05-12 (×2): qty 10

## 2018-05-12 MED ORDER — INSULIN ASPART 100 UNIT/ML ~~LOC~~ SOLN
0.0000 [IU] | Freq: Three times a day (TID) | SUBCUTANEOUS | Status: DC
  Administered 2018-05-13 (×2): 2 [IU] via SUBCUTANEOUS
  Administered 2018-05-14: 1 [IU] via SUBCUTANEOUS
  Administered 2018-05-14: 2 [IU] via SUBCUTANEOUS

## 2018-05-12 MED ORDER — WARFARIN SODIUM 2.5 MG PO TABS
1.2500 mg | ORAL_TABLET | ORAL | Status: DC
  Administered 2018-05-12: 1.25 mg via ORAL
  Filled 2018-05-12 (×2): qty 0.5

## 2018-05-12 NOTE — Progress Notes (Signed)
Progress Note    Lorie Cleckley  MAU:[id number] DOB: Dec 25, 1930  DOA: 05/11/2018 PCP: Hoyt Koch, MD    Brief Narrative:     Medical records reviewed and are as summarized below:  [name] Schubring is an 84 y.o. female  gerd, PUD, hypertension, hyperlipidemia, dm2, mitral regurgitation, Pafib, chronic diastolic CHF, apparently presents with altered mental status and noted to have UTI and mild afib with RVR. Pt denies fever, chills, cough, cp, palp, sob, n/v, diarrhea, dysuria, hematuria.  Pt recently tx for uti.   Assessment/Plan:   Principal Problem:   Altered mental status Active Problems:   History of pulmonary embolism   Diabetes mellitus type 2, noninsulin dependent (HCC)   A-fib (HCC)   Acute lower UTI  Acute respiratory failure- O2 sats down to 85 % on RA -BNP elevated -afib with RVR at times -wean O2 as tolerated -IV lasix  AMS-- lives alone at baseline -? Hypoxia? -recently treated for UTI-- rocephin IV -dose of Neurontin increased recently-- ? If needs reduced -b12 high -ammonia 36 (normal 35) MRI brain- unable to get due to pacemaker HOLD xanax -TSH normal 8 days ago  Acute urinary retention -foley placed in ER  CKD stage iV -baseline around 1.6  Afib with RVR Cont coumadin per pharmacy Cont metoprolol 25mg  po qday -cardiology consult to adjust meds ? If this caused chf flare  Acute on Chronic diastolic CHF IV lasix for diuresis Cont Metoprolol 25mg  po qday  H/o PE Coumadin pharmacy to dose   Dm2 with neuropathy Cont gabapentin 300mg  po tid -SSI   obesity Body mass index is 31.56 kg/m.   Family Communication/Anticipated D/C date and plan/Code Status   DVT prophylaxis: coumadin Code Status: Full Code.  Family Communication: called daughter Disposition Plan:    Medical Consultants:    cards     Subjective:   Where am I?  Objective:    Vitals:   05/12/18 0825 05/12/18 0826 05/12/18 0941  05/12/18 1230  BP:   110/72 115/78  Pulse:   66 66  Resp:      Temp:    (!) 97.4 F (36.3 C)  TempSrc:    Oral  SpO2: (!) 88% 96%  99%  Weight:      Height:        Intake/Output Summary (Last 24 hours) at 05/12/2018 1437 Last data filed at 05/12/2018 1000 Gross per 24 hour  Intake 583 ml  Output 2900 ml  Net -2317 ml   Filed Weights   05/11/18 2047 05/12/18 0351  Weight: 105.6 kg 105.6 kg    Exam: Pleasantly confused Oriented to person not place/time Crackles at base +BS, soft  Data Reviewed:   I have personally reviewed following labs and imaging studies:  Labs: Labs show the following:   Basic Metabolic Panel: Recent Labs  Lab 05/11/18 1238 05/12/18 0228  NA 141 142  K 3.7 3.6  CL 105 103  CO2 23 27  GLUCOSE 194* 119*  BUN 19 19  CREATININE 1.84* 1.73*  CALCIUM 8.9 9.0   GFR Estimated Creatinine Clearance: 32.3 mL/min (A) (by C-G formula based on SCr of 1.73 mg/dL (H)). Liver Function Tests: Recent Labs  Lab 05/11/18 1238 05/12/18 0228  AST 22 18  ALT 14 13  ALKPHOS 102 91  BILITOT 1.7* 1.5*  PROT 7.6 6.6  ALBUMIN 3.4* 3.1*   No results for input(s): LIPASE, AMYLASE in the last 168 hours. Recent Labs  Lab 05/11/18 2027  AMMONIA 36*  Coagulation profile Recent Labs  Lab 05/11/18 1238 05/12/18 0859  INR 2.14 2.15    CBC: Recent Labs  Lab 05/11/18 1238 05/12/18 0228  WBC 4.4 4.7  HGB 12.6 11.8*  HCT 41.7 38.4  MCV 92.7 90.4  PLT 234 219   Cardiac Enzymes: Recent Labs  Lab 05/11/18 1238 05/11/18 2027 05/12/18 0228 05/12/18 0859  TROPONINI <0.03 <0.03 <0.03 <0.03   BNP (last 3 results) Recent Labs    05/20/17 1659  PROBNP 387.0*   CBG: Recent Labs  Lab 05/11/18 2231 05/12/18 0311 05/12/18 0828 05/12/18 1128  GLUCAP 159* 108* 150* 110*   D-Dimer: No results for input(s): DDIMER in the last 72 hours. Hgb A1c: No results for input(s): HGBA1C in the last 72 hours. Lipid Profile: No results for input(s):  CHOL, HDL, LDLCALC, TRIG, CHOLHDL, LDLDIRECT in the last 72 hours. Thyroid function studies: Recent Labs    05/11/18 12-22-25  TSH 1.906   Anemia work up: Recent Labs    05/11/18 2027  VITAMINB12 3,076*   Sepsis Labs: Recent Labs  Lab 05/11/18 1238 05/12/18 0228  WBC 4.4 4.7    Microbiology Recent Results (from the past 240 hour(s))  Urine culture     Status: None   Collection Time: 05/11/18  4:10 PM  Result Value Ref Range Status   Specimen Description URINE, RANDOM  Final   Special Requests NONE  Final   Culture   Final    NO GROWTH Performed at [facility], [address] [place], [place] [address]    Report Status 05/12/2018 FINAL  Final    Procedures and diagnostic studies:  Dg Chest 2 View  Result Date: 05/11/2018 CLINICAL DATA:  Congestive heart failure.  Atrial fibrillation. EXAM: CHEST - 2 VIEW COMPARISON:  03/31/2018 FINDINGS: Chronic cardiomegaly. Chronic aortic atherosclerosis. Pacemaker leads appear the same. Pulmonary vascularity within normal limits. No interstitial or alveolar edema. No visible effusion. No significant bone finding. IMPRESSION: Chronic cardiomegaly and aortic atherosclerosis. Pacemaker. No active process otherwise. Electronically Signed   By: Nelson Chimes M.D.   On: 05/11/2018 13:32   Ct Head Wo Contrast  Result Date: 05/11/2018 CLINICAL DATA:  Increased lethargy with altered mental status EXAM: CT HEAD WITHOUT CONTRAST TECHNIQUE: Contiguous axial images were obtained from the base of the skull through the vertex without intravenous contrast. COMPARISON:  March 14, 2017 FINDINGS: Brain: Mild to moderate diffuse atrophy is stable. There is no intracranial mass, hemorrhage, extra-axial fluid collection, or midline shift. There is small vessel disease throughout much of the centra semiovale bilaterally. Small vessel disease is noted in each external capsule anteriorly. There is small vessel disease in the left thalamus. No acute infarct is  evident. Vascular: There is no hyperdense vessel. There is calcification in each carotid siphon region, somewhat more on the left than on the right. Skull: The bony calvarium appears intact. Sinuses/Orbits: There is mucosal thickening in several ethmoid air cells bilaterally. Other visualized paranasal sinuses are clear. Orbits appear symmetric bilaterally. Other: Visualized mastoid air cells are clear. IMPRESSION: Atrophy with extensive supratentorial small vessel disease. No acute infarct evident. No mass or hemorrhage. There are foci of arterial vascular calcification. Mucosal thickening noted in several ethmoid air cells. Electronically Signed   By: Lowella Grip III M.D.   On: 05/11/2018 13:56    Medications:   . bethanechol  25 mg Oral BID  . furosemide  60 mg Intravenous BID  . gabapentin  600 mg Oral Daily   And  .  gabapentin  300 mg Oral QHS  . insulin aspart  0-9 Units Subcutaneous Q4H  . memantine  5 mg Oral BID  . metoprolol succinate  25 mg Oral Daily  . pyridoxine  200 mg Oral Daily  . sodium chloride flush  3 mL Intravenous Q12H  . vitamin B-12  500 mcg Oral Daily  . warfarin  1.25 mg Oral Once per day on Tue Thu Sat  . [START ON 05/13/2018] warfarin  2.5 mg Oral Once per day on Sun Mon Wed Fri  . Warfarin - Pharmacist Dosing Inpatient   Does not apply q1800   Continuous Infusions: . sodium chloride    . cefTRIAXone (ROCEPHIN)  IV 1 g (05/12/18 1033)     LOS: 0 days   Geradine Girt  [facility]   *Please refer to [email or web address], password [email or web address] to get updated schedule on who will round on this patient, as hospitalists switch teams weekly. If 7PM-7AM, please contact night-coverage at [email or web address], password [email or web address] for any overnight needs.  05/12/2018, 2:37 PM

## 2018-05-12 NOTE — Evaluation (Signed)
Physical Therapy Evaluation Patient Details Name: [name] [name] MRN: [id number] DOB: 10/20/1930 Today's Date: 05/12/2018   History of Present Illness  [name] is a 84 y.o F who presented to the ED with confusion, weakness, and SOB. Per notes, recent diagnosis of UTI. PMH includes A-Fib, PE, DVT, T2DM, Breast Cx, CHF, neuropathy, HTN, CRD, CKD, Osteopenia.   Clinical Impression  Pt presents with problems above and deficits below. Pt presenting with some altered mental status, but is pleasant. Pt performed bed mobility with Supervision. Pt performed sit<>stand transfers and ambulated with RW and Supervision-MinG. VSS on 3L of O2. Feel patient will require 24 hour assist to increase safety at home given current cognitive deficits. Will continue to follow acutely to support independence, mobility, and safety.    Follow Up Recommendations Supervision/Assistance - 24 hour;Home health PT    Equipment Recommendations  None recommended by PT    Recommendations for Other Services OT consult     Precautions / Restrictions Precautions Precautions: Fall Restrictions Weight Bearing Restrictions: No      Mobility  Bed Mobility Overal bed mobility: Needs Assistance Bed Mobility: Supine to Sit     Supine to sit: Supervision     General bed mobility comments: Pt was able to sit up in the bed without physical assist. Pt required cues to scoot hips to EOB. Pt reported cramping in L side with sitting up at EOB. Cramping subsided.   Transfers Overall transfer level: Needs assistance Equipment used: Rolling walker (2 wheeled) Transfers: Sit to/from Stand Sit to Stand: Min guard;Supervision;From elevated surface         General transfer comment: Pt was able to perform sit<>stand transfers with MinG-Supervision. Pt required cues for safe hand placement and requested the bed be elevated for transfer. Pt was able to power up without significant difficulty from a slightly elevated surface and  had no visible LOB during transfer. Pt had some difficulty controlling the last ~2 inches of descent when transferring to recliner, and "plopped" down rather than demonstrating safe control.  Ambulation/Gait Ambulation/Gait assistance: Supervision;Min guard Gait Distance (Feet): 175 Feet Assistive device: Rolling walker (2 wheeled) Gait Pattern/deviations: Step-through pattern;Decreased stride length;Wide base of support Gait velocity: Decreased   General Gait Details: Pt ambulated slowly, with toes pointed outward, and with a short step-through gait pattern. Pt had no visible LOB during ambulation, and O2 Sats WFL on 3L of O2. Pt responded to cues to stand tall. Pt easily distracted by conversation during ambulation. Pt was able to find her way back to her room after walking to nurse's station and back.  Stairs            Wheelchair Mobility    Modified Rankin (Stroke Patients Only)       Balance Overall balance assessment: Needs assistance Sitting-balance support: No upper extremity supported;Feet supported Sitting balance-Leahy Scale: Good Sitting balance - Comments: Pt able to scoot hips to EOB without visible LOB   Standing balance support: Bilateral upper extremity supported;Single extremity supported Standing balance-Leahy Scale: Poor Standing balance comment: Pt reliant on BUE support for balance during ambulation. Pt was able to stand with Unilateral UE support multiple times while PT took pulse-ox measurements. Suspect pt would be unsteady without RW for support.                               Pertinent Vitals/Pain Pain Assessment: No/denies pain    Home Living Family/patient expects to be  discharged to:: Private residence Living Arrangements: Alone(With dog) Available Help at Discharge: Family;Available PRN/intermittently Type of Home: House Home Access: Stairs to enter Entrance Stairs-Rails: Can reach both Entrance Stairs-Number of Steps: 2 Home  Layout: One level Home Equipment: Walker - 2 wheels;Bedside commode;Grab bars - tub/shower      Prior Function Level of Independence: Independent;Independent with assistive device(s)         Comments: Pt reports she would use RW when she felt she needed it, or for longer distances.      Hand Dominance   Dominant Hand: Right    Extremity/Trunk Assessment   Upper Extremity Assessment Upper Extremity Assessment: Defer to OT evaluation    Lower Extremity Assessment Lower Extremity Assessment: Generalized weakness    Cervical / Trunk Assessment Cervical / Trunk Assessment: Normal  Communication   Communication: No difficulties  Cognition Arousal/Alertness: Awake/alert Behavior During Therapy: WFL for tasks assessed/performed Overall Cognitive Status: No family/caregiver present to determine baseline cognitive functioning                                 General Comments: Pt is pleasant. Pt is easily distracted, and answers question appropriately, but seems to have decreased awareness of surroundings. Pt was able to find her room after being walked down a straight hallway, but PT suspect patient would have difficulty in a more complex environment.        General Comments General comments (skin integrity, edema, etc.): Pt is pleasant    Exercises     Assessment/Plan    PT Assessment Patient needs continued PT services  PT Problem List Decreased range of motion;Decreased strength;Decreased mobility;Decreased knowledge of use of DME;Decreased cognition;Decreased balance       PT Treatment Interventions DME instruction;Gait training;Stair training;Therapeutic activities;Functional mobility training;Therapeutic exercise;Balance training;Patient/family education    PT Goals (Current goals can be found in the Care Plan section)  Acute Rehab PT Goals Patient Stated Goal: To go home and play with her dog PT Goal Formulation: With patient Time For Goal  Achievement: 05/12/18 Potential to Achieve Goals: Good    Frequency Min 3X/week   Barriers to discharge Decreased caregiver support      Co-evaluation               AM-PAC PT "6 Clicks" Daily Activity  Outcome Measure Difficulty turning over in bed (including adjusting bedclothes, sheets and blankets)?: A Little Difficulty moving from lying on back to sitting on the side of the bed? : A Little Difficulty sitting down on and standing up from a chair with arms (e.g., wheelchair, bedside commode, etc,.)?: Unable Help needed moving to and from a bed to chair (including a wheelchair)?: A Little Help needed walking in hospital room?: A Little Help needed climbing 3-5 steps with a railing? : A Lot 6 Click Score: 15    End of Session Equipment Utilized During Treatment: Gait belt;Oxygen Activity Tolerance: Patient tolerated treatment well Patient left: in chair;with call bell/phone within reach;with chair alarm set;with nursing/sitter in room Nurse Communication: Mobility status PT Visit Diagnosis: Other abnormalities of gait and mobility (R26.89);Muscle weakness (generalized) (M62.81);Difficulty in walking, not elsewhere classified (R26.2);Unsteadiness on feet (R26.81)    Time: 1740-8144 PT Time Calculation (min) (ACUTE ONLY): 32 min   Charges:   PT Evaluation $PT Eval Low Complexity: 1 Low PT Treatments $Gait Training: 8-22 mins      Elwin Mocha, S-DPT [place] Student [phone]  05/12/2018, 2:25 PM

## 2018-05-12 NOTE — Consult Note (Addendum)
Cardiology Consult    Patient ID: [name] [name] MRN: [id number], DOB/AGE: 05-11-1931   Admit date: 05/11/2018 Date of Consult: 05/12/2018  Primary Physician: Hoyt Koch, MD Primary Cardiologist: Dr. Rayann Heman Requesting Provider: Dr. Eliseo Squires Reason for Consultation: Afib  [name] [name] is a 84 y.o. female who is being seen today for the evaluation of Afib at the request of Dr. Eliseo Squires.  Patient Profile    84 yo female with PMH of PAF, Breast Ca, Chronic diastolic HF, HTN, PE, DM and peripheral neuropathy who presented with AMS and AFib RVR.   Past Medical History   Past Medical History:  Diagnosis Date  . Acute gastric ulcer without mention of hemorrhage, perforation, or obstruction 2006   EGD  . Anxiety   . Atrial fibrillation (HCC)    flecainide; coumadin  . Breast cancer ([place])    left  . Carotid artery disease ([place])    62/13: RICA 0-86%, LICA 57-84%  //  [place] 69/62: RICA 9-52%; LICA 84-13% >> FU 1 year   . Chronic diastolic heart failure (HCC)    a. echo 10/10: mild LVH, EF 55-60%, mild AI, mild MR, mild to mod LAE, mild RVE, severe RAE, mod TR, PASP 53  /  b.  Echo 3/14:  Mild LVH, EF 55%, Tr AI, MAC, mild MR, mod LAE, PASP 31, trivial eff. // c. echo 1/17: EF 55-60%, mild AI, MAC, mild MR, moderate BAE, moderate TR  . Chronic lower back pain   . Diabetic peripheral neuropathy ([place]) 03/09/2015  . Diverticulosis   . Esophagitis, unspecified 2006   EGD  . Hiatal hernia 2006   EGD  . History of nuclear stress test    a. Myoview 2/12: EF 69%, no scar or ischemia  . Hx of adenomatous colonic polyps   . Hypertension   . Mediastinal lymphadenopathy   . Memory difficulty 11/14/2016  . Mitral regurgitation    mild, echo, October, 2010  . Osteopenia   . Presence of permanent cardiac pacemaker   . Pulmonary embolus [facility]) 2006   2006, with DVT  . Pulmonary HTN ([place])    53 mmHg echo, 2010, moderate TR, mild right ventricular enlargement  . Tachy-brady  syndrome ([place])    s/p pacer 07/2009  . Thyroid nodule    non-neoplastic goiter  . Type II diabetes mellitus ([place])   . Venous insufficiency    Chronic    Past Surgical History:  Procedure Laterality Date  . ABDOMINAL HYSTERECTOMY    . ANKLE FRACTURE SURGERY Right   . BACK SURGERY    . BREAST LUMPECTOMY Left   . FRACTURE SURGERY    . INSERT / REPLACE / REMOVE PACEMAKER  06/30/2009   MDT Adalpta L implanted by Dr Olevia Perches  . LUMBAR LAMINECTOMY  1973, 06/22/2011   "no hardware either time"    Allergies  Allergies  Allergen Reactions  . Amiodarone Shortness Of Breath and Nausea Only    "Toxicity," also  . Flecainide Shortness Of Breath and Other (See Comments)    "Toxicity," also   . Morphine And Related Nausea And Vomiting and Other (See Comments)    Family requests no morphine due to past reaction. Pt stated it made her sick, severe nausea and vomiting  . Bactrim Ds [Sulfamethoxazole-Trimethoprim] Diarrhea  . Cephalexin Nausea And Vomiting  . Pineapple Itching    Severe itching    History of Present Illness    [name] [name] is an 84 yo female with PMH of PAF,  Breast Ca, Chronic diastolic HF, HTN, PE, DM and peripheral neuropathy. She is followed by Dr. Rayann Heman as an outpatient. Notes indicate she has been tried on amiodarone and flecainide in the past but stopped 2/2 to toxicity. Followed in the coumadin clinic. She was last seen in the office on 9/18 and reported doing well. Noted to be in SR with just Toprol 71m daily. No adjustments made to her medications. Last echo in 6/18 showed normal EF with G2DD, and severely enlarged LA, with elevated PA pressure.   She was seen by her PCP in the office on 05/08/18 and diagnosed with UTI, placed on Macrobid. Presented back to the office for follow up and noted to be in Afib RVR and AMS. Sent to the ED for further evaluation. In the ED her EKG showed AFib RVR with a rate of 117. Labs showed stable electrolytes, BNP 581, Hgb 12.6, Trop neg x3,  Cr 1.83. She was started on IV rocephin, and admitted to IM for further management. Foley was placed in the ED 2/2 to urinary retention. Diuresed with IV lasix. Remains in AFib, but HR has improved into the 90s. She is asymptomatic at the time of exam.   Inpatient Medications    . bethanechol  25 mg Oral BID  . furosemide  60 mg Intravenous BID  . gabapentin  600 mg Oral Daily   And  . gabapentin  300 mg Oral QHS  . insulin aspart  0-9 Units Subcutaneous Q4H  . memantine  5 mg Oral BID  . metoprolol succinate  25 mg Oral Daily  . pyridoxine  200 mg Oral Daily  . sodium chloride flush  3 mL Intravenous Q12H  . vitamin B-12  500 mcg Oral Daily  . warfarin  1.25 mg Oral Once per day on Tue Thu Sat  . [START ON 05/13/2018] warfarin  2.5 mg Oral Once per day on Sun Mon Wed Fri  . Warfarin - Pharmacist Dosing Inpatient   Does not apply q1800    Family History    Family History  Problem Relation Age of Onset  . Heart disease Mother   . Hypertension Mother   . COPD Father   . Hypertension Father   . Hypertension Sister   . Hypertension Brother   . Hypertension Daughter   . Colon cancer Neg Hx   . Heart attack Neg Hx   . Stroke Neg Hx     Social History    Social History   Socioeconomic History  . Marital status: Widowed    Spouse name: Not on file  . Number of children: 3  . Years of education: Not on file  . Highest education level: Not on file  Occupational History  . Occupation: Retired   SScientific laboratory technician . Financial resource strain: Not on file  . Food insecurity:    Worry: Not on file    Inability: Not on file  . Transportation needs:    Medical: Not on file    Non-medical: Not on file  Tobacco Use  . Smoking status: Former Smoker    Packs/day: 1.00    Years: 47.00    Pack years: 47.00    Types: Cigarettes    Last attempt to quit: 10/26/1998    Years since quitting: 19.5  . Smokeless tobacco: Never Used  Substance and Sexual Activity  . Alcohol use: No     Comment: 10/14/2016 "I'll have a beer q once in awhile"  . Drug use: No  .  Sexual activity: Not Currently  Lifestyle  . Physical activity:    Days per week: Not on file    Minutes per session: Not on file  . Stress: Not on file  Relationships  . Social connections:    Talks on phone: Not on file    Gets together: Not on file    Attends religious service: Not on file    Active member of club or organization: Not on file    Attends meetings of clubs or organizations: Not on file    Relationship status: Not on file  . Intimate partner violence:    Fear of current or ex partner: Not on file    Emotionally abused: Not on file    Physically abused: Not on file    Forced sexual activity: Not on file  Other Topics Concern  . Not on file  Social History Narrative   Widowed.  Lives in [place] by herself.  Avoids caffeine.  Very active, taking care of her brother who also lives by himself and is in his late 76's.     Review of Systems    See HPI  All other systems reviewed and are otherwise negative except as noted above.  Physical Exam    Blood pressure 115/78, pulse 66, temperature (!) 97.4 F (36.3 C), temperature source Oral, resp. rate 20, height 6' (1.829 m), weight 105.6 kg, SpO2 99 %.  General: Pleasant, older WF, NAD Psych: Normal affect. Neuro: Alert and oriented X 3. Moves all extremities spontaneously. HEENT: Normal  Neck: Supple, no JVD. Lungs:  Resp regular and unlabored, CTA. Heart: Irreg Irreg no murmurs. Abdomen: Soft, non-tender, non-distended, BS + x 4.  Extremities: No clubbing, cyanosis or edema. DP/PT/Radials 2+ and equal bilaterally.  Labs    Troponin (Point of Care Test) No results for input(s): TROPIPOC in the last 72 hours. Recent Labs    05/11/18 1238 05/11/18 2027 05/12/18 0228 05/12/18 0859  TROPONINI <0.03 <0.03 <0.03 <0.03   Lab Results  Component Value Date   WBC 4.7 05/12/2018   HGB 11.8 (L) 05/12/2018   HCT 38.4 05/12/2018   MCV 90.4  05/12/2018   PLT 219 05/12/2018    Recent Labs  Lab 05/12/18 0228  NA 142  K 3.6  CL 103  CO2 27  BUN 19  CREATININE 1.73*  CALCIUM 9.0  PROT 6.6  BILITOT 1.5*  ALKPHOS 91  ALT 13  AST 18  GLUCOSE 119*   No results found for: CHOL, HDL, LDLCALC, TRIG Lab Results  Component Value Date   DDIMER <0.27 03/18/2017     Radiology Studies    Dg Chest 2 View  Result Date: 05/11/2018 CLINICAL DATA:  Congestive heart failure.  Atrial fibrillation. EXAM: CHEST - 2 VIEW COMPARISON:  03/31/2018 FINDINGS: Chronic cardiomegaly. Chronic aortic atherosclerosis. Pacemaker leads appear the same. Pulmonary vascularity within normal limits. No interstitial or alveolar edema. No visible effusion. No significant bone finding. IMPRESSION: Chronic cardiomegaly and aortic atherosclerosis. Pacemaker. No active process otherwise. Electronically Signed   By: Nelson Chimes M.D.   On: 05/11/2018 13:32   Ct Head Wo Contrast  Result Date: 05/11/2018 CLINICAL DATA:  Increased lethargy with altered mental status EXAM: CT HEAD WITHOUT CONTRAST TECHNIQUE: Contiguous axial images were obtained from the base of the skull through the vertex without intravenous contrast. COMPARISON:  March 14, 2017 FINDINGS: Brain: Mild to moderate diffuse atrophy is stable. There is no intracranial mass, hemorrhage, extra-axial fluid collection, or midline shift. There is  small vessel disease throughout much of the centra semiovale bilaterally. Small vessel disease is noted in each external capsule anteriorly. There is small vessel disease in the left thalamus. No acute infarct is evident. Vascular: There is no hyperdense vessel. There is calcification in each carotid siphon region, somewhat more on the left than on the right. Skull: The bony calvarium appears intact. Sinuses/Orbits: There is mucosal thickening in several ethmoid air cells bilaterally. Other visualized paranasal sinuses are clear. Orbits appear symmetric bilaterally. Other:  Visualized mastoid air cells are clear. IMPRESSION: Atrophy with extensive supratentorial small vessel disease. No acute infarct evident. No mass or hemorrhage. There are foci of arterial vascular calcification. Mucosal thickening noted in several ethmoid air cells. Electronically Signed   By: Lowella Grip III M.D.   On: 05/11/2018 13:56    ECG & Cardiac Imaging    EKG:  The EKG was personally reviewed and demonstrates Afib RVR  Echo: 03/15/17  Study Conclusions  - Left ventricle: The cavity size was normal. Wall thickness was   increased in a pattern of mild LVH. Systolic function was normal.   The estimated ejection fraction was in the range of 60% to 65%.   Wall motion was normal; there were no regional wall motion   abnormalities. Features are consistent with a pseudonormal left   ventricular filling pattern, with concomitant abnormal relaxation   and increased filling pressure (grade 2 diastolic dysfunction).   Doppler parameters are consistent with high ventricular filling   pressure. - Aortic valve: There was mild regurgitation. Valve area (VTI):   2.22 cm^2. Valve area (Vmax): 2.44 cm^2. Valve area (Vmean): 2.14   cm^2. - Mitral valve: Mildly calcified annulus. Mildly thickened leaflets   . There was mild regurgitation. Valve area by continuity equation   (using LVOT flow): 2.06 cm^2. - Left atrium: The atrium was moderately to severely dilated. - Right ventricle: The cavity size was mildly dilated. - Right atrium: The atrium was moderately dilated. - Atrial septum: No defect or patent foramen ovale was identified. - Tricuspid valve: There was moderate-severe regurgitation. - Pulmonary arteries: Systolic pressure was moderately increased.   PA peak pressure: 61 mm Hg (S).  Assessment & Plan    84 yo female with PMH of PAF, Breast Ca, Chronic diastolic HF, HTN, PE, DM and peripheral neuropathy who presented with AMS and AFib RVR.   1. AFib RVR: Hx of the same and  followed by Dr. Rayann Heman as an outpatient. Presented with AMS and elevated rate from PCP office. Being treated for UTI currently. Rates were elevated more so on admission, but have improved. She has been therapeutic with her coumadin. She is asymptomatic at this time. Appears to have been on amiodarone in the past but stopped 2/2 to difficultly breathing. For now would focus on rate control as her options for rhythm control are limited. Given her enlarge LA would not attempt DCCV as she would likely not maintain SR.  -- coumadin per PharmD -- will further increase her Toprol to 31m daily  2. UTI: treated with Macrobid as an outpatient by PCP. UA improved on admission.   3. Chronic diastolic HF: BNP was elevated >500 on admission. Has been on IV lasix with 2.2L UOP. Breathing is stable. Would continue IV lasix today, and plan to transition back to oral dosing tomorrow.  -- check echo to ensure no decline in EF.   4. HTN: stable with current therapy  5. Hx of PE: on coumadin  Signed, LRia Comment  Mancel Bale, NP-C Pager [phone] 05/12/2018, 2:21 PM   Patient seen and examined. Agree with assessment and plan.  [name] [name] is a very pleasant 84 year old female who is followed by Dr. Rayann Heman for her cardiology care.  She has a history of paroxysmal atrial fibrillation and remotely had been on amiodarone and flecainide but these were stopped secondary to toxicity.  She has a history of pacemaker insertion for symptomatic sinus bradycardia and this had been programmed to DDI and when last seen by Dr. Rayann Heman in September 2018 she was 100% V pacing andhe had turned MVP back on.  Her cha2ds2vasc score is 5 and she has been on chronic warfarin.  She has a history of PE, breast CA status post left mastectomy, diabetes mellitus, and PE.  Her last echo Doppler study was in June 2018 which showed an EF of 60 to 76%, grade 2 diastolic dysfunction, mild aortic insufficiency, mitral annular calcification with mild  MR, moderate to severe biatrial enlargement with mild RV dilatation, moderate to severe TR, and moderate pulmonary hypertension with a PA peak pressure at 61 mm.  Had recently developed a UTI for which she was treated with antibiotics.  She presented back to her primary care doctor yesterday and she was noted to be in atrial fibrillation with increased ventricular rate.  Her initial ECG has shown she will fibrillation with rate at 117.  QTc interval was 479 ms.  Telemetry now reveals some improvement in ventricular rate now in the 80s to 90s.  She is on warfarin anticoagulation.  INR today is therapeutic at 2.15.  Her exam is notable for stable blood pressure.  Her pulse is irregularly irregular in the 80s.  HEENT was unremarkable.  There is a soft left carotid bruit.  (She was previously noted to have 50 to 69% distal left carotid stenosis on duplex imaging in February 2018), her lungs are clear without wheezing.  There was no chest wall pain.  Rhythm was irregular irregular with rate in the 80s.  Abdomen was soft and nontender with positive bowel sounds.  There was no clubbing cyanosis or edema.  Homans sign was negative.  Neurologic exam was grossly nonfocal.  At present, the patient is asymptomatic regarding her atrial fibrillation.  Mostly she had been on antiarrhythmic therapy consisting of amiodarone which led to some shortness of breath in addition to flecainide.  At present, recommend further rate control and will increase metoprolol to 50 mg from her present dose of 25 mg.  She has a pacemaker in place which would prevent any bradycardia arrhythmic events.  She is therapeutically anticoagulated.  BNP is increased at 581 and agree with IV Lasix.  Creatinine is increased to 1.73.  Will reassess echo Doppler study.  Follow-up renal function and BMP in a.m.  At present, continue with rate control and would not institute antiarrhythmic therapy but defer to Dr. Rayann Heman when he sees her for follow-up evaluation  post discharge.   Troy Sine, MD, [facility] 05/12/2018 3:16 PM

## 2018-05-12 NOTE — Discharge Instructions (Signed)

## 2018-05-12 NOTE — Progress Notes (Signed)
Patient refusing to wear oxygen tonight. Patient desatas on room air periodically. Placed patient on continuous pulse oximetry. Will continue to monitor O2 sats overnight. Patient sating 92% on room air currently.

## 2018-05-12 NOTE — Progress Notes (Signed)
Pharmacist - Physician Communication  Concerning - Appropriate dosing of gabapentin  Description: Pharmacy asked to look at gabapentin dosing to make sure it is appropriate.  Gabapentin dosing of 600mg  at bedtime and 300mg  in the morning (PTA dose) is appropriate for pt's CrCl and age.    Recommendation: Continue current gabapentin dosing If patient experiences side effects (such as nystagmus, somnolence, dizziness), consider decreasing to 300mg  BID.  Sherlon Handing, PharmD, BCPS Clinical pharmacist

## 2018-05-12 NOTE — Progress Notes (Signed)
ANTICOAGULATION CONSULT NOTE - Follow Up Consult  Pharmacy Consult for Coumadin Indication: afib, h/o DVT/PE  Allergies  Allergen Reactions  . Amiodarone Shortness Of Breath and Nausea Only    "Toxicity," also  . Flecainide Shortness Of Breath and Other (See Comments)    "Toxicity," also   . Morphine And Related Nausea And Vomiting and Other (See Comments)    Family requests no morphine due to past reaction. Pt stated it made her sick, severe nausea and vomiting  . Bactrim Ds [Sulfamethoxazole-Trimethoprim] Diarrhea  . Cephalexin Nausea And Vomiting  . Pineapple Itching    Severe itching    Patient Measurements: Height: 6' (182.9 cm) Weight: 232 lb 11.2 oz (105.6 kg)(scale b) IBW/kg (Calculated) : 73.1   Vital Signs: Temp: 97.4 F (36.3 C) (08/13 0351) Temp Source: Oral (08/13 0351) BP: 110/72 (08/13 0941) Pulse Rate: 66 (08/13 0941)  Labs: Recent Labs    05/11/18 1238 05/11/18 2027 05/12/18 0228 05/12/18 0859  HGB 12.6  --  11.8*  --   HCT 41.7  --  38.4  --   PLT 234  --  219  --   LABPROT 23.8*  --   --  23.8*  INR 2.14  --   --  2.15  CREATININE 1.84*  --  1.73*  --   TROPONINI <0.03 <0.03 <0.03 <0.03    Estimated Creatinine Clearance: 32.3 mL/min (A) (by C-G formula based on SCr of 1.73 mg/dL (H)).  Assessment: Anticoag: Afib and h/o DVT/PE on warfarin. Hgb 11.8, Plts 219 both relatively stable. INR 2.15 in goal -PTA regimen: 2.5mg  every Su/M/W/F and 1.25mg  on T/T/Sa  Goal of Therapy:  INR 2-3 Monitor platelets by anticoagulation protocol: Yes   Plan:  Continue home warfarin regimen Daily INR  Randy Castrejon S. Alford Highland, PharmD, BCPS Clinical Staff Pharmacist Pager [phone]  Eilene Ghazi Stillinger 05/12/2018,11:09 AM

## 2018-05-12 NOTE — Plan of Care (Signed)
  Problem: Education: Goal: Knowledge of General Education information will improve Description Including pain rating scale, medication(s)/side effects and non-pharmacologic comfort measures Outcome: Progressing   Problem: Health Behavior/Discharge Planning: Goal: Ability to manage health-related needs will improve Outcome: Progressing   Problem: Clinical Measurements: Goal: Ability to maintain clinical measurements within normal limits will improve Outcome: Progressing   Problem: Activity: Goal: Risk for activity intolerance will decrease Outcome: Progressing   Problem: Nutrition: Goal: Adequate nutrition will be maintained Outcome: Progressing   Problem: Coping: Goal: Level of anxiety will decrease Outcome: Progressing   Problem: Safety: Goal: Ability to remain free from injury will improve Outcome: Progressing   Problem: Skin Integrity: Goal: Risk for impaired skin integrity will decrease Outcome: Progressing   

## 2018-05-13 ENCOUNTER — Observation Stay (HOSPITAL_BASED_OUTPATIENT_CLINIC_OR_DEPARTMENT_OTHER): Payer: Medicare HMO

## 2018-05-13 DIAGNOSIS — I481 Persistent atrial fibrillation: Secondary | ICD-10-CM

## 2018-05-13 DIAGNOSIS — R4182 Altered mental status, unspecified: Secondary | ICD-10-CM | POA: Diagnosis not present

## 2018-05-13 DIAGNOSIS — I4891 Unspecified atrial fibrillation: Secondary | ICD-10-CM | POA: Diagnosis not present

## 2018-05-13 DIAGNOSIS — I361 Nonrheumatic tricuspid (valve) insufficiency: Secondary | ICD-10-CM | POA: Diagnosis not present

## 2018-05-13 DIAGNOSIS — Z86711 Personal history of pulmonary embolism: Secondary | ICD-10-CM

## 2018-05-13 DIAGNOSIS — Z7901 Long term (current) use of anticoagulants: Secondary | ICD-10-CM | POA: Diagnosis not present

## 2018-05-13 DIAGNOSIS — E119 Type 2 diabetes mellitus without complications: Secondary | ICD-10-CM | POA: Diagnosis not present

## 2018-05-13 DIAGNOSIS — I480 Paroxysmal atrial fibrillation: Secondary | ICD-10-CM | POA: Diagnosis not present

## 2018-05-13 DIAGNOSIS — I5033 Acute on chronic diastolic (congestive) heart failure: Secondary | ICD-10-CM | POA: Diagnosis not present

## 2018-05-13 LAB — GLUCOSE, CAPILLARY
GLUCOSE-CAPILLARY: 177 mg/dL — AB (ref 70–99)
GLUCOSE-CAPILLARY: 165 mg/dL — AB (ref 70–99)
Glucose-Capillary: 113 mg/dL — ABNORMAL HIGH (ref 70–99)
Glucose-Capillary: 115 mg/dL — ABNORMAL HIGH (ref 70–99)

## 2018-05-13 LAB — ECHOCARDIOGRAM COMPLETE
HEIGHTINCHES: 72 in
WEIGHTICAEL: 3756.8 [oz_av]

## 2018-05-13 LAB — BASIC METABOLIC PANEL
Anion gap: 11 (ref 5–15)
BUN: 23 mg/dL (ref 8–23)
CALCIUM: 8.8 mg/dL — AB (ref 8.9–10.3)
CHLORIDE: 104 mmol/L (ref 98–111)
CO2: 26 mmol/L (ref 22–32)
CREATININE: 1.76 mg/dL — AB (ref 0.44–1.00)
GFR calc non Af Amer: 25 mL/min — ABNORMAL LOW (ref >60)
GFR, EST AFRICAN AMERICAN: 29 mL/min — AB (ref >60)
Glucose, Bld: 136 mg/dL — ABNORMAL HIGH (ref 70–99)
Potassium: 3.6 mmol/L (ref 3.5–5.1)
SODIUM: 141 mmol/L (ref 135–145)

## 2018-05-13 LAB — CBC
HCT: 39.1 % (ref 36.0–46.0)
Hemoglobin: 12.1 g/dL (ref 12.0–15.0)
MCH: 28 pg (ref 26.0–34.0)
MCHC: 30.9 g/dL (ref 30.0–36.0)
MCV: 90.5 fL (ref 78.0–100.0)
Platelets: 245 10*3/uL (ref 150–400)
RBC: 4.32 MIL/uL (ref 3.87–5.11)
RDW: 15.2 % (ref 11.5–15.5)
WBC: 4.9 10*3/uL (ref 4.0–10.5)

## 2018-05-13 LAB — PROTIME-INR
INR: 2.32
PROTHROMBIN TIME: 25.3 s — AB (ref 11.4–15.2)

## 2018-05-13 LAB — ANA: Anti Nuclear Antibody(ANA): NEGATIVE

## 2018-05-13 MED ORDER — METOPROLOL SUCCINATE ER 50 MG PO TB24
75.0000 mg | ORAL_TABLET | Freq: Every day | ORAL | Status: DC
  Administered 2018-05-14: 75 mg via ORAL
  Filled 2018-05-13: qty 1

## 2018-05-13 MED ORDER — CAMPHOR-MENTHOL 0.5-0.5 % EX LOTN
TOPICAL_LOTION | CUTANEOUS | Status: DC | PRN
  Filled 2018-05-13: qty 222

## 2018-05-13 MED ORDER — METOPROLOL SUCCINATE ER 25 MG PO TB24
25.0000 mg | ORAL_TABLET | Freq: Every day | ORAL | Status: AC
  Administered 2018-05-13: 25 mg via ORAL
  Filled 2018-05-13: qty 1

## 2018-05-13 MED ORDER — FUROSEMIDE 80 MG PO TABS
80.0000 mg | ORAL_TABLET | Freq: Two times a day (BID) | ORAL | Status: DC
  Administered 2018-05-13 – 2018-05-14 (×2): 80 mg via ORAL
  Filled 2018-05-13 (×2): qty 1

## 2018-05-13 NOTE — Progress Notes (Signed)
SATURATION QUALIFICATIONS: (This note is used to comply with regulatory documentation for home oxygen)  Patient Saturations on Room Air at Rest = 97%  Patient Saturations on Room Air while Ambulating = 90%  Pt ambulated in hallway with mobility tech, [place]. Pt used walker, tolerated well

## 2018-05-13 NOTE — Progress Notes (Signed)
Pt unable to tolerate straight catheterization, pt continues to clench legs  Pt educated on need for straight catheterization, pt understating and verbalizes teach back  Pt continues to cross legs Asked pt if it is painful, pt denies pain  Called 4W to complete 4W NT completed at bedside

## 2018-05-13 NOTE — Progress Notes (Signed)
Patient right arm presents with redness/pain as infusion of abx begins. Abx d/c. IV team consult entered.

## 2018-05-13 NOTE — Progress Notes (Signed)
ANTICOAGULATION CONSULT NOTE - Follow Up Consult  Pharmacy Consult for Coumadin Indication: afib, h/o DVT/PE  Allergies  Allergen Reactions  . Amiodarone Shortness Of Breath and Nausea Only    "Toxicity," also  . Flecainide Shortness Of Breath and Other (See Comments)    "Toxicity," also   . Morphine And Related Nausea And Vomiting and Other (See Comments)    Family requests no morphine due to past reaction. Pt stated it made her sick, severe nausea and vomiting  . Bactrim Ds [Sulfamethoxazole-Trimethoprim] Diarrhea  . Cephalexin Nausea And Vomiting  . Pineapple Itching    Severe itching    Patient Measurements: Height: 6' (182.9 cm) Weight: 234 lb 12.8 oz (106.5 kg)(scale b) IBW/kg (Calculated) : 73.1   Vital Signs: Temp: 97.6 F (36.4 C) (08/14 0453) Temp Source: Oral (08/14 0453) BP: 115/90 (08/14 0907) Pulse Rate: 92 (08/14 0907)  Labs: Recent Labs    05/11/18 1238 05/11/18 2027 05/12/18 0228 05/12/18 0859 05/13/18 0409  HGB 12.6  --  11.8*  --  12.1  HCT 41.7  --  38.4  --  39.1  PLT 234  --  219  --  245  LABPROT 23.8*  --   --  23.8* 25.3*  INR 2.14  --   --  2.15 2.32  CREATININE 1.84*  --  1.73*  --  1.76*  TROPONINI <0.03 <0.03 <0.03 <0.03  --     Estimated Creatinine Clearance: 31.9 mL/min (A) (by C-G formula based on SCr of 1.76 mg/dL (H)).  Assessment: [name] [name] on warfarin PTA for Afib and h/o DVT/PE. INR 2.14 on admission. -PTA regimen: 2.5mg  every Su/M/W/F and 1.25mg  on T/T/Sa  INR today is 2.32. Hgb is 12.1, plt 245. No s/sx of bleeding. On concurrent ceftriaxone - plan to finish today.   Goal of Therapy:  INR 2-3 Monitor platelets by anticoagulation protocol: Yes   Plan:  Continue home warfarin regimen - plan for 2.5 mg tonight Daily INR  Doylene Canard, PharmD Clinical Pharmacist  Pager: [phone] Phone: [phone] 05/13/2018,11:00 AM

## 2018-05-13 NOTE — Progress Notes (Signed)
NP cardiology stated increasing metoprolol, adding additional dose to be given now

## 2018-05-13 NOTE — Progress Notes (Addendum)
Progress Note    [name] [name]  SLH:[id number] DOB: 1931-03-12  DOA: 05/11/2018 PCP: Hoyt Koch, MD    Brief Narrative:     Medical records reviewed and are as summarized below:  [name] [name] is an 84 y.o. female  gerd, PUD, hypertension, hyperlipidemia, dm2, mitral regurgitation, Pafib, chronic diastolic CHF, apparently presents with altered mental status and noted to have UTI and mild afib with RVR. Pt denies fever, chills, cough, cp, palp, sob, n/v, diarrhea, dysuria, hematuria.  Pt recently tx for uti.   Assessment/Plan:   Principal Problem:   Altered mental status Active Problems:   History of pulmonary embolism   Diabetes mellitus type 2, noninsulin dependent (HCC)   A-fib (HCC)   Acute lower UTI  Acute respiratory failure- O2 sats down to 85 % on RA -BNP elevated -afib with RVR at times -have been able to wean O2 off with IV lasix-- down >3L -echo: - The right ventricular systolic pressure was increased consistent  with mild pulmonary hypertension  AMS-- lives alone at baseline -? Hypoxia? -recently treated for UTI-- rocephin IV x 3 days -- culture NGTD -b12 high -ammonia 36 (normal 35) MRI brain- unable to get due to pacemaker HOLDing xanax -TSH normal 8 days ago -improved this AM  Acute urinary retention -foley placed in ER -will do voiding trial today  CKD stage iV -baseline around 1.6  Afib with RVR Cont coumadin per pharmacy -continue to titrate metoprolol  Acute on Chronic diastolic CHF -change IV lasix to home PO lasix and monitor  H/o PE Coumadin pharmacy to dose  Dm2 with neuropathy Cont gabapentin  -SSI  obesity Body mass index is 31.84 kg/m.   Plan to continue to titrate medications for better HR control.  Patient lives alone and plan is for her to return home.  Needs to be monitored in hospital while medications are being adjusted.  She is on coumadin and falls could be catastrophic.     Family  Communication/Anticipated D/C date and plan/Code Status   DVT prophylaxis: coumadin Code Status: Full Code.  Family Communication: called daughter 8/13 Disposition Plan: home in AM   Medical Consultants:    cards     Subjective:   My back is itching  Objective:    Vitals:   05/13/18 0505 05/13/18 0907 05/13/18 1011 05/13/18 1209  BP:  115/90  104/65  Pulse:  92  78  Resp:   19 19  Temp:    (!) 97.5 F (36.4 C)  TempSrc:    Oral  SpO2:   93% 96%  Weight: 106.5 kg     Height:        Intake/Output Summary (Last 24 hours) at 05/13/2018 1337 Last data filed at 05/13/2018 1119 Gross per 24 hour  Intake 440.42 ml  Output 1250 ml  Net -809.58 ml   Filed Weights   05/11/18 2047 05/12/18 0351 05/13/18 0505  Weight: 105.6 kg 105.6 kg 106.5 kg    Exam: Pleasant in bed irr and slightly fast about 103 No wheezing No rash on back, no lesions, no dry skin  Data Reviewed:   I have personally reviewed following labs and imaging studies:  Labs: Labs show the following:   Basic Metabolic Panel: Recent Labs  Lab 05/11/18 1238 05/12/18 0228 05/13/18 0409  NA 141 142 141  K 3.7 3.6 3.6  CL 105 103 104  CO2 23 27 26   GLUCOSE 194* 119* 136*  BUN 19 19 23   CREATININE  1.84* 1.73* 1.76*  CALCIUM 8.9 9.0 8.8*   GFR Estimated Creatinine Clearance: 31.9 mL/min (A) (by C-G formula based on SCr of 1.76 mg/dL (H)). Liver Function Tests: Recent Labs  Lab 05/11/18 1238 05/12/18 0228  AST 22 18  ALT 14 13  ALKPHOS 102 91  BILITOT 1.7* 1.5*  PROT 7.6 6.6  ALBUMIN 3.4* 3.1*   No results for input(s): LIPASE, AMYLASE in the last 168 hours. Recent Labs  Lab 05/11/18 2026-01-10  AMMONIA 36*   Coagulation profile Recent Labs  Lab 05/11/18 1238 05/12/18 0859 05/13/18 0409  INR 2.14 2.15 2.32    CBC: Recent Labs  Lab 05/11/18 1238 05/12/18 0228 05/13/18 0409  WBC 4.4 4.7 4.9  HGB 12.6 11.8* 12.1  HCT 41.7 38.4 39.1  MCV 92.7 90.4 90.5  PLT 234 219 245    Cardiac Enzymes: Recent Labs  Lab 05/11/18 1238 05/11/18 2027 05/12/18 0228 05/12/18 0859  TROPONINI <0.03 <0.03 <0.03 <0.03   BNP (last 3 results) Recent Labs    05/20/17 1659  PROBNP 387.0*   CBG: Recent Labs  Lab 05/12/18 1128 05/12/18 1633 05/12/18 01/10/17 05/13/18 0808 05/13/18 1152  GLUCAP 110* 129* 160* 113* 177*   D-Dimer: No results for input(s): DDIMER in the last 72 hours. Hgb A1c: No results for input(s): HGBA1C in the last 72 hours. Lipid Profile: No results for input(s): CHOL, HDL, LDLCALC, TRIG, CHOLHDL, LDLDIRECT in the last 72 hours. Thyroid function studies: Recent Labs    05/11/18 10-Jan-2026  TSH 1.906   Anemia work up: Recent Labs    05/11/18 2027  VITAMINB12 3,076*   Sepsis Labs: Recent Labs  Lab 05/11/18 1238 05/12/18 0228 05/13/18 0409  WBC 4.4 4.7 4.9    Microbiology Recent Results (from the past 240 hour(s))  Urine culture     Status: None   Collection Time: 05/11/18  4:10 PM  Result Value Ref Range Status   Specimen Description URINE, RANDOM  Final   Special Requests NONE  Final   Culture   Final    NO GROWTH Performed at [facility], [address] [place], [place] [address]    Report Status 05/12/2018 FINAL  Final    Procedures and diagnostic studies:  Ct Head Wo Contrast  Result Date: 05/11/2018 CLINICAL DATA:  Increased lethargy with altered mental status EXAM: CT HEAD WITHOUT CONTRAST TECHNIQUE: Contiguous axial images were obtained from the base of the skull through the vertex without intravenous contrast. COMPARISON:  March 14, 2017 FINDINGS: Brain: Mild to moderate diffuse atrophy is stable. There is no intracranial mass, hemorrhage, extra-axial fluid collection, or midline shift. There is small vessel disease throughout much of the centra semiovale bilaterally. Small vessel disease is noted in each external capsule anteriorly. There is small vessel disease in the left thalamus. No acute infarct is evident.  Vascular: There is no hyperdense vessel. There is calcification in each carotid siphon region, somewhat more on the left than on the right. Skull: The bony calvarium appears intact. Sinuses/Orbits: There is mucosal thickening in several ethmoid air cells bilaterally. Other visualized paranasal sinuses are clear. Orbits appear symmetric bilaterally. Other: Visualized mastoid air cells are clear. IMPRESSION: Atrophy with extensive supratentorial small vessel disease. No acute infarct evident. No mass or hemorrhage. There are foci of arterial vascular calcification. Mucosal thickening noted in several ethmoid air cells. Electronically Signed   By: Lowella Grip III M.D.   On: 05/11/2018 13:56    Medications:   . bethanechol  25  mg Oral BID  . furosemide  80 mg Oral BID  . gabapentin  600 mg Oral Daily   And  . gabapentin  300 mg Oral QHS  . insulin aspart  0-5 Units Subcutaneous QHS  . insulin aspart  0-9 Units Subcutaneous TID WC  . memantine  5 mg Oral BID  . [START ON 05/14/2018] metoprolol succinate  75 mg Oral Daily  . pyridoxine  200 mg Oral Daily  . sodium chloride flush  3 mL Intravenous Q12H  . vitamin B-12  500 mcg Oral Daily  . warfarin  1.25 mg Oral Once per day on Tue Thu Sat  . warfarin  2.5 mg Oral Once per day on Sun Mon Wed Fri  . Warfarin - Pharmacist Dosing Inpatient   Does not apply q1800   Continuous Infusions: . sodium chloride       LOS: 0 days   Geradine Girt  [facility]   *Please refer to [email or web address], password [email or web address] to get updated schedule on who will round on this patient, as hospitalists switch teams weekly. If 7PM-7AM, please contact night-coverage at [email or web address], password [email or web address] for any overnight needs.  05/13/2018, 1:37 PM

## 2018-05-13 NOTE — Progress Notes (Addendum)
Progress Note  Patient Name: [name] [name] Date of Encounter: 05/13/2018  Primary Cardiologist: No primary care provider on file.  Subjective   Sitting in bed this morning. No complaints. Just had her echo.   Inpatient Medications    Scheduled Meds: . bethanechol  25 mg Oral BID  . gabapentin  600 mg Oral Daily   And  . gabapentin  300 mg Oral QHS  . insulin aspart  0-5 Units Subcutaneous QHS  . insulin aspart  0-9 Units Subcutaneous TID WC  . memantine  5 mg Oral BID  . metoprolol succinate  50 mg Oral Daily  . pyridoxine  200 mg Oral Daily  . sodium chloride flush  3 mL Intravenous Q12H  . vitamin B-12  500 mcg Oral Daily  . warfarin  1.25 mg Oral Once per day on Tue Thu Sat  . warfarin  2.5 mg Oral Once per day on Sun Mon Wed Fri  . Warfarin - Pharmacist Dosing Inpatient   Does not apply q1800   Continuous Infusions: . sodium chloride     PRN Meds: sodium chloride, acetaminophen **OR** acetaminophen, hydrocortisone cream, promethazine, sodium chloride flush   Vital Signs    Vitals:   05/13/18 0453 05/13/18 0505 05/13/18 0907 05/13/18 1011  BP: 110/60  115/90   Pulse: (!) 103  92   Resp: 18   19  Temp: 97.6 F (36.4 C)     TempSrc: Oral     SpO2: 95%   93%  Weight:  106.5 kg    Height:        Intake/Output Summary (Last 24 hours) at 05/13/2018 1119 Last data filed at 05/13/2018 0957 Gross per 24 hour  Intake 140.42 ml  Output 1250 ml  Net -1109.58 ml   Filed Weights   05/11/18 2047 05/12/18 0351 05/13/18 0505  Weight: 105.6 kg 105.6 kg 106.5 kg    Telemetry    Afib with elevated rate at times - Personally Reviewed  Physical Exam   General: Well developed, well nourished, older W female appearing in no acute distress. Head: Normocephalic, atraumatic.  Neck: Supple, no JVD. Lungs:  Resp regular and unlabored, CTA. Heart: Irreg Irreg, S1, S2, no murmur; no rub. Abdomen: Soft, non-tender, non-distended with normoactive bowel sounds.    Extremities: No clubbing, cyanosis, edema. Distal pedal pulses are 2+ bilaterally. Neuro: Alert and oriented X 3. Moves all extremities spontaneously. Psych: Normal affect.  Labs    Chemistry Recent Labs  Lab 05/11/18 1238 05/12/18 0228 05/13/18 0409  NA 141 142 141  K 3.7 3.6 3.6  CL 105 103 104  CO2 23 27 26   GLUCOSE 194* 119* 136*  BUN 19 19 23   CREATININE 1.84* 1.73* 1.76*  CALCIUM 8.9 9.0 8.8*  PROT 7.6 6.6  --   ALBUMIN 3.4* 3.1*  --   AST 22 18  --   ALT 14 13  --   ALKPHOS 102 91  --   BILITOT 1.7* 1.5*  --   GFRNONAA 24* 26* 25*  GFRAA 28* 30* 29*  ANIONGAP 13 12 11      Hematology Recent Labs  Lab 05/11/18 1238 05/12/18 0228 05/13/18 0409  WBC 4.4 4.7 4.9  RBC 4.50 4.25 4.32  HGB 12.6 11.8* 12.1  HCT 41.7 38.4 39.1  MCV 92.7 90.4 90.5  MCH 28.0 27.8 28.0  MCHC 30.2 30.7 30.9  RDW 15.2 15.2 15.2  PLT 234 219 245    Cardiac Enzymes Recent Labs  Lab 05/11/18  1238 05/11/18 2027 05/12/18 0228 05/12/18 0859  TROPONINI <0.03 <0.03 <0.03 <0.03   No results for input(s): TROPIPOC in the last 168 hours.   BNP Recent Labs  Lab 05/11/18 1238  BNP 581.6*     DDimer No results for input(s): DDIMER in the last 168 hours.    Radiology    Dg Chest 2 View  Result Date: 05/11/2018 CLINICAL DATA:  Congestive heart failure.  Atrial fibrillation. EXAM: CHEST - 2 VIEW COMPARISON:  03/31/2018 FINDINGS: Chronic cardiomegaly. Chronic aortic atherosclerosis. Pacemaker leads appear the same. Pulmonary vascularity within normal limits. No interstitial or alveolar edema. No visible effusion. No significant bone finding. IMPRESSION: Chronic cardiomegaly and aortic atherosclerosis. Pacemaker. No active process otherwise. Electronically Signed   By: Nelson Chimes M.D.   On: 05/11/2018 13:32   Ct Head Wo Contrast  Result Date: 05/11/2018 CLINICAL DATA:  Increased lethargy with altered mental status EXAM: CT HEAD WITHOUT CONTRAST TECHNIQUE: Contiguous axial images  were obtained from the base of the skull through the vertex without intravenous contrast. COMPARISON:  March 14, 2017 FINDINGS: Brain: Mild to moderate diffuse atrophy is stable. There is no intracranial mass, hemorrhage, extra-axial fluid collection, or midline shift. There is small vessel disease throughout much of the centra semiovale bilaterally. Small vessel disease is noted in each external capsule anteriorly. There is small vessel disease in the left thalamus. No acute infarct is evident. Vascular: There is no hyperdense vessel. There is calcification in each carotid siphon region, somewhat more on the left than on the right. Skull: The bony calvarium appears intact. Sinuses/Orbits: There is mucosal thickening in several ethmoid air cells bilaterally. Other visualized paranasal sinuses are clear. Orbits appear symmetric bilaterally. Other: Visualized mastoid air cells are clear. IMPRESSION: Atrophy with extensive supratentorial small vessel disease. No acute infarct evident. No mass or hemorrhage. There are foci of arterial vascular calcification. Mucosal thickening noted in several ethmoid air cells. Electronically Signed   By: Lowella Grip III M.D.   On: 05/11/2018 13:56    Cardiac Studies   TTE: pending  Patient Profile     84 y.o. female with PMH of PAF, Breast Ca, Chronic diastolic HF, HTN, PE, DM and peripheral neuropathy who presented with AMS and AFib RVR.   Assessment & Plan    1. AFib RVR: Hx of the same and followed by Dr. Rayann Heman as an outpatient. Presented with AMS and elevated rate from PCP office. Being treated for UTI currently. She has been therapeutic with her coumadin. She is asymptomatic. Appears to have been on amiodarone/flecainide in the past but stopped 2/2 to difficultly breathing. For now would focus on rate control as her options for rhythm control are limited. Given her enlarge LA would not attempt DCCV as she would likely not maintain SR.  -- coumadin per  PharmD -- rates are still elevated with activity. Will further increase Toprol to 75mg  daily.   2. UTI: treated with Macrobid as an outpatient by PCP. UA improved on admission.   3. Chronic diastolic HF: BNP was elevated >500 on admission. Has been on IV lasix with 2.2L UOP. Breathing is stable. Transition back to oral lasix 80mg  BID today.  -- echo pending  4. HTN: stable with current therapy  5. Hx of PE: on coumadin  6. Symptomatic bradycardia s/p PPM: Followed by Dr. Rayann Heman. Demand pacing noted.    Signed, Reino Bellis, NP  05/13/2018, 11:19 AM  Pager # [phone]   For questions or updates, please contact [facility] Please consult [email or web address] for contact info under Cardiology/STEMI.   Patient seen and examined. Agree with assessment and plan. Feels better. AF persists, with rate in the 90s agree with toprol titration for more optimal rate control. Excellent diuresis -3126 since admission. Will f/u BNP in am.  Transition to oral lasix. Echo pending.    Troy Sine, MD, [facility] 05/13/2018 11:57 AM

## 2018-05-13 NOTE — Progress Notes (Signed)
Physical Therapy Treatment Patient Details Name: [name] [name] MRN: [id number] DOB: 06-19-1931 Today's Date: 05/13/2018    History of Present Illness [name] [name] is a 84 y.o F who presented to the ED with confusion, weakness, and SOB. Per notes, recent diagnosis of UTI. PMH includes A-Fib, PE, DVT, T2DM, Breast Cx, CHF, neuropathy, HTN, CRD, CKD, Osteopenia.     PT Comments    Patient progressing with therapy, ambulating stairs this visit with min guard. Pt is on RA, no SOB or DOE during visit. Pt with poor safety awareness overall, with inadequate obstacle avoidence with RW. Agree with HHPT provided pt can have some 24 hour support at home with family.      Follow Up Recommendations  Supervision/Assistance - 24 hour;Home health PT     Equipment Recommendations  None recommended by PT    Recommendations for Other Services OT consult     Precautions / Restrictions Precautions Precautions: Fall Restrictions Weight Bearing Restrictions: No    Mobility  Bed Mobility Overal bed mobility: Needs Assistance Bed Mobility: Supine to Sit     Supine to sit: Supervision     General bed mobility comments: OOB at entry  Transfers Overall transfer level: Needs assistance Equipment used: Rolling walker (2 wheeled) Transfers: Sit to/from Stand Sit to Stand: Min guard;From elevated surface         General transfer comment: cues for safety, easily distracted  Ambulation/Gait Ambulation/Gait assistance: Supervision;Min guard Gait Distance (Feet): 150 Feet Assistive device: Rolling walker (2 wheeled) Gait Pattern/deviations: Step-through pattern;Decreased stride length;Wide base of support Gait velocity: Decreased   General Gait Details: Pt ambulating without physical assistance, no SOB or overt LOB, no DOE. Patient demonstrating poor awareness with obstacle avoidance    Stairs Stairs: Yes Stairs assistance: Min guard Stair Management: One rail Left;One rail Right Number  of Stairs: 4 General stair comments: pt able to perform stairs with min guard alternating patten today.    Wheelchair Mobility    Modified Rankin (Stroke Patients Only)       Balance Overall balance assessment: Needs assistance Sitting-balance support: No upper extremity supported;Feet supported Sitting balance-Leahy Scale: Good     Standing balance support: Bilateral upper extremity supported;Single extremity supported;During functional activity Standing balance-Leahy Scale: Poor Standing balance comment: Pt reliant on BUE support for balance during ambulation. Pt was able to stand with Unilateral UE support multiple times while PT took pulse-ox measurements. Suspect pt would be unsteady without RW for support.                              Cognition Arousal/Alertness: Awake/alert Behavior During Therapy: WFL for tasks assessed/performed Overall Cognitive Status: No family/caregiver present to determine baseline cognitive functioning                                 General Comments: easily distracted, STM/recall impaired. Answers questions appropriately, however demos decreased awareness of surroundings and deficits      Exercises      General Comments        Pertinent Vitals/Pain Pain Assessment: No/denies pain    Home Living Family/patient expects to be discharged to:: Private residence Living Arrangements: Alone Available Help at Discharge: Family;Available PRN/intermittently Type of Home: House Home Access: Stairs to enter Entrance Stairs-Rails: Can reach both Home Layout: One level Home Equipment: Walker - 2 wheels;Bedside commode;Grab bars - tub/shower  Prior Function Level of Independence: Independent;Independent with assistive device(s)      Comments: Pt reports she would use RW when she felt she needed it, or for longer distances. Indpendent with ADLs and home mgt, does not drive anymore   PT Goals (current goals can now  be found in the care plan section) Acute Rehab PT Goals Patient Stated Goal: To go home and play with her dog PT Goal Formulation: With patient Time For Goal Achievement: 05/12/18 Potential to Achieve Goals: Good Progress towards PT goals: Progressing toward goals    Frequency    Min 3X/week      PT Plan Current plan remains appropriate    Co-evaluation              AM-PAC PT "6 Clicks" Daily Activity  Outcome Measure  Difficulty turning over in bed (including adjusting bedclothes, sheets and blankets)?: A Little Difficulty moving from lying on back to sitting on the side of the bed? : A Little Difficulty sitting down on and standing up from a chair with arms (e.g., wheelchair, bedside commode, etc,.)?: Unable Help needed moving to and from a bed to chair (including a wheelchair)?: A Little Help needed walking in hospital room?: A Little Help needed climbing 3-5 steps with a railing? : A Little 6 Click Score: 16    End of Session Equipment Utilized During Treatment: Gait belt Activity Tolerance: Patient tolerated treatment well Patient left: in chair;with call bell/phone within reach;with chair alarm set;with nursing/sitter in room Nurse Communication: Mobility status PT Visit Diagnosis: Other abnormalities of gait and mobility (R26.89);Muscle weakness (generalized) (M62.81);Difficulty in walking, not elsewhere classified (R26.2);Unsteadiness on feet (R26.81)     Time: 1430-1450 PT Time Calculation (min) (ACUTE ONLY): 20 min  Charges:  $Gait Training: 8-22 mins                    Reinaldo Berber, PT, DPT Acute Rehab Services Pager: [phone]    Reinaldo Berber 05/13/2018, 3:09 PM

## 2018-05-13 NOTE — Evaluation (Signed)
Occupational Therapy Evaluation Patient Details Name: [name] [name] MRN: [id number] DOB: 17-Apr-1931 Today's Date: 05/13/2018    History of Present Illness [name] is a 84 y.o F who presented to the ED with confusion, weakness, and SOB. Per notes, recent diagnosis of UTI. PMH includes A-Fib, PE, DVT, T2DM, Breast Cx, CHF, neuropathy, HTN, CRD, CKD, Osteopenia.    Clinical Impression   Pt with decline in function and safety with ADLs and ADL mobility with decreased strength, balance, endurance and cognition. Pt answers questions appropriately, however STM/recall is impaired and pt is easily distracted. PTA, pt lived at home alone independent with ADLs, home mgt and used cane and RW for mobility. Pt reports that she does not drive anymore due to neuropathy in her feet. Pt reports that she has 3 children in the ares an that he daughter lives the closest to her and provides transportation for her. Pt would benefit from acute OT services to address impairments to maximize level of function and safety    Follow Up Recommendations  Home health OT;Supervision/Assistance - 24 hour    Equipment Recommendations  None recommended by OT    Recommendations for Other Services       Precautions / Restrictions Precautions Precautions: Fall Restrictions Weight Bearing Restrictions: No      Mobility Bed Mobility Overal bed mobility: Needs Assistance Bed Mobility: Supine to Sit     Supine to sit: Supervision     General bed mobility comments: Pt was able to sit up in the bed without physical assist. Pt required cues for task continuation x 3, easily distracted  Transfers Overall transfer level: Needs assistance Equipment used: Rolling walker (2 wheeled) Transfers: Sit to/from Stand Sit to Stand: Min guard;From elevated surface         General transfer comment: cues for safety, easily distracted    Balance Overall balance assessment: Needs assistance Sitting-balance support: No  upper extremity supported;Feet supported Sitting balance-Leahy Scale: Good     Standing balance support: Bilateral upper extremity supported;Single extremity supported;During functional activity Standing balance-Leahy Scale: Poor                             ADL either performed or assessed with clinical judgement   ADL Overall ADL's : Needs assistance/impaired Eating/Feeding: Independent;Sitting   Grooming: Wash/dry hands;Wash/dry face;Standing   Upper Body Bathing: Set up;Supervision/ safety;Sitting   Lower Body Bathing: Minimal assistance;Min guard;Sit to/from stand   Upper Body Dressing : Moderate assistance;Supervision/safety;Sitting   Lower Body Dressing: Minimal assistance;Min guard;Sit to/from stand   Toilet Transfer: Min guard;Ambulation;RW;Comfort height toilet;Grab bars;Cueing for safety   Toileting- Clothing Manipulation and Hygiene: Min guard;Sit to/from stand;Cueing for safety   Tub/ Shower Transfer: Min guard;Ambulation;Rolling walker;Grab bars;3 in 1;Cueing for safety   Functional mobility during ADLs: Min guard;Cueing for safety;Rolling walker       Vision Baseline Vision/History: Wears glasses Wears Glasses: Reading only Patient Visual Report: No change from baseline       Perception     Praxis      Pertinent Vitals/Pain Pain Assessment: No/denies pain     Hand Dominance Right   Extremity/Trunk Assessment Upper Extremity Assessment Upper Extremity Assessment: Generalized weakness   Lower Extremity Assessment Lower Extremity Assessment: Defer to PT evaluation   Cervical / Trunk Assessment Cervical / Trunk Assessment: Normal   Communication Communication Communication: No difficulties   Cognition Arousal/Alertness: Awake/alert Behavior During Therapy: WFL for tasks assessed/performed Overall Cognitive Status: No family/caregiver  present to determine baseline cognitive functioning                                  General Comments: easily distracted, STM/recall impaired. Answers questions appropriately, however demos decreased awareness of surroundings and deficits   General Comments       Exercises     Shoulder Instructions      Home Living Family/patient expects to be discharged to:: Private residence Living Arrangements: Alone Available Help at Discharge: Family;Available PRN/intermittently Type of Home: House Home Access: Stairs to enter [employer] of Steps: 2 Entrance Stairs-Rails: Can reach both Home Layout: One level     Bathroom Shower/Tub: Teacher, early years/pre: Handicapped height     Home Equipment: Environmental consultant - 2 wheels;Bedside commode;Grab bars - tub/shower          Prior Functioning/Environment Level of Independence: Independent;Independent with assistive device(s)        Comments: Pt reports she would use RW when she felt she needed it, or for longer distances. Indpendent with ADLs and home mgt, does not drive anymore        OT Problem List: Decreased strength;Decreased activity tolerance;Decreased cognition;Decreased knowledge of use of DME or AE;Impaired balance (sitting and/or standing);Decreased knowledge of precautions      OT Treatment/Interventions: Self-care/ADL training;Therapeutic exercise;DME and/or AE instruction;Therapeutic activities;Patient/family education    OT Goals(Current goals can be found in the care plan section) Acute Rehab OT Goals Patient Stated Goal: To go home and play with her dog OT Goal Formulation: With patient Time For Goal Achievement: 05/27/18 Potential to Achieve Goals: Good ADL Goals Pt Will Perform Grooming: with set-up;with supervision;standing Pt Will Perform Upper Body Bathing: with modified independence;sitting;standing Pt Will Perform Lower Body Bathing: with min guard assist;with supervision;with set-up;sit to/from stand Pt Will Perform Upper Body Dressing: with set-up;with modified  independence Pt Will Perform Lower Body Dressing: with min guard assist;with supervision;with set-up;sitting/lateral leans;sit to/from stand Pt Will Transfer to Toilet: with supervision;with modified independence;ambulating;regular height toilet Pt Will Perform Toileting - Clothing Manipulation and hygiene: with supervision;with modified independence;sit to/from stand Pt Will Perform Tub/Shower Transfer: with supervision;with modified independence;ambulating;rolling walker;shower seat;grab bars Additional ADL Goal #1: pt will retrieve and put away ADL/toiletry items safely  OT Frequency: Min 2X/week   Barriers to D/C:    no barriers       Co-evaluation              AM-PAC PT "6 Clicks" Daily Activity     Outcome Measure Help from another person eating meals?: None Help from another person taking care of personal grooming?: A Little Help from another person toileting, which includes using toliet, bedpan, or urinal?: A Little Help from another person bathing (including washing, rinsing, drying)?: A Little Help from another person to put on and taking off regular upper body clothing?: A Little Help from another person to put on and taking off regular lower body clothing?: A Little 6 Click Score: 19   End of Session Equipment Utilized During Treatment: Rolling walker;Other (comment);Gait belt(3 in 1)  Activity Tolerance: Patient tolerated treatment well Patient left: in chair;with call bell/phone within reach;with chair alarm set  OT Visit Diagnosis: Unsteadiness on feet (R26.81);Muscle weakness (generalized) (M62.81);Other symptoms and signs involving cognitive function                Time: 1601-0932 OT Time Calculation (min): 22 min Charges:  OT General  Charges $OT Visit: 1 Visit OT Evaluation $OT Eval Low Complexity: 1 Low    Britt Bottom 05/13/2018, 2:50 PM

## 2018-05-13 NOTE — Progress Notes (Signed)
  Echocardiogram 2D Echocardiogram has been performed.  [name] [name] 05/13/2018, 11:20 AM

## 2018-05-14 DIAGNOSIS — I4891 Unspecified atrial fibrillation: Secondary | ICD-10-CM | POA: Diagnosis not present

## 2018-05-14 DIAGNOSIS — Z7901 Long term (current) use of anticoagulants: Secondary | ICD-10-CM | POA: Diagnosis not present

## 2018-05-14 DIAGNOSIS — Z86711 Personal history of pulmonary embolism: Secondary | ICD-10-CM | POA: Diagnosis not present

## 2018-05-14 DIAGNOSIS — I5033 Acute on chronic diastolic (congestive) heart failure: Secondary | ICD-10-CM | POA: Diagnosis not present

## 2018-05-14 DIAGNOSIS — E119 Type 2 diabetes mellitus without complications: Secondary | ICD-10-CM | POA: Diagnosis not present

## 2018-05-14 DIAGNOSIS — I481 Persistent atrial fibrillation: Secondary | ICD-10-CM | POA: Diagnosis not present

## 2018-05-14 DIAGNOSIS — I480 Paroxysmal atrial fibrillation: Secondary | ICD-10-CM | POA: Diagnosis not present

## 2018-05-14 DIAGNOSIS — R4182 Altered mental status, unspecified: Secondary | ICD-10-CM | POA: Diagnosis not present

## 2018-05-14 LAB — BASIC METABOLIC PANEL
Anion gap: 12 (ref 5–15)
BUN: 26 mg/dL — AB (ref 8–23)
CHLORIDE: 104 mmol/L (ref 98–111)
CO2: 24 mmol/L (ref 22–32)
CREATININE: 1.78 mg/dL — AB (ref 0.44–1.00)
Calcium: 8.9 mg/dL (ref 8.9–10.3)
GFR calc Af Amer: 29 mL/min — ABNORMAL LOW (ref >60)
GFR calc non Af Amer: 25 mL/min — ABNORMAL LOW (ref >60)
GLUCOSE: 129 mg/dL — AB (ref 70–99)
Potassium: 3.9 mmol/L (ref 3.5–5.1)
SODIUM: 140 mmol/L (ref 135–145)

## 2018-05-14 LAB — CBC
HCT: 41.9 % (ref 36.0–46.0)
HEMOGLOBIN: 12.6 g/dL (ref 12.0–15.0)
MCH: 27.6 pg (ref 26.0–34.0)
MCHC: 30.1 g/dL (ref 30.0–36.0)
MCV: 91.7 fL (ref 78.0–100.0)
PLATELETS: 267 10*3/uL (ref 150–400)
RBC: 4.57 MIL/uL (ref 3.87–5.11)
RDW: 15.4 % (ref 11.5–15.5)
WBC: 4.3 10*3/uL (ref 4.0–10.5)

## 2018-05-14 LAB — PROTIME-INR
INR: 2.07
Prothrombin Time: 23.1 seconds — ABNORMAL HIGH (ref 11.4–15.2)

## 2018-05-14 LAB — GLUCOSE, CAPILLARY
GLUCOSE-CAPILLARY: 132 mg/dL — AB (ref 70–99)
Glucose-Capillary: 195 mg/dL — ABNORMAL HIGH (ref 70–99)

## 2018-05-14 MED ORDER — GABAPENTIN 300 MG PO CAPS: 300.0000 mg | ORAL_CAPSULE | ORAL | Status: DC

## 2018-05-14 MED ORDER — METOPROLOL SUCCINATE ER 25 MG PO TB24: 75.0000 mg | ORAL_TABLET | Freq: Every day | ORAL | 0 refills | Status: DC

## 2018-05-14 MED ORDER — HYDROXYZINE HCL 25 MG PO TABS: 12.5000 mg | ORAL_TABLET | Freq: Three times a day (TID) | ORAL | 0 refills | Status: DC | PRN

## 2018-05-14 NOTE — Discharge Summary (Signed)
Physician Discharge Summary  [name] [name] BWL:[id number] DOB: [birth year] DOA: 05/11/2018  PCP: Hoyt Koch, MD  Admit date: 05/11/2018 Discharge date: 05/14/2018  Admitted From: home Discharge disposition: home   Recommendations for Outpatient Follow-Up:   1. Home health 2. Supervision by family   Discharge Diagnosis:   Principal Problem:   Altered mental status Active Problems:   History of pulmonary embolism   Diabetes mellitus type 2, noninsulin dependent ([place])   A-fib ([place])   Acute lower UTI    Discharge Condition: Improved.  Diet recommendation: Low sodium, heart healthy.  Carbohydrate-modified  Wound care: None.  Code status: Full.   History of Present Illness:   [name] [name]  is a 84 y.o. female, w gerd, PUD, hypertension, hyperlipidemia, dm2, mitral regurgitation, Pafib, chronic diastolic CHF, apparently presents with altered mental status and noted to have UTI and mild afib with RVR. Pt denies fever, chills, cough, cp, palp, sob, n/v, diarrhea, dysuria, hematuria.  Pt recently tx for uti.    Hospital Course by Problem:   Acute respiratory failure- O2 sats down to 85 % on RA -BNP elevated -afib with RVR at times -have been able to wean O2 off -echo: - The right ventricular systolic pressure was increased consistent with mild pulmonary hypertension -resolved  AMS-- lives alone at baseline -? Hypoxia vs urinary retention -recently treated for UTI-- rocephin IV x 3 days -- culture NGTD -b12 high -ammonia 36 (normal 35) MRI brain- unable to get due to pacemaker held xanax -TSH normal 8 days ago -confusion resolved  Acute urinary retention -successful voiding trial yesterday  CKD stage iV -baseline around 1.6  Afib with RVR Cont coumadin  - titrate metoprolol  Acute on Chronic diastolic CHF -changed IV lasix to home PO lasix-- had good diuresis: 5L  H/o PE Coumadin -- resume home regimen  Dm2 with  neuropathy Cont gabapentin   obesity Body mass index is 31.84 kg/m.     Medical Consultants:   cards   Discharge Exam:   Vitals:   05/14/18 1019 05/14/18 1149  BP: 114/69 (!) 95/54  Pulse: 71 63  Resp:    Temp:  (!) 97.4 F (36.3 C)  SpO2:  100%   Vitals:   05/13/18 1949 05/14/18 0430 05/14/18 1019 05/14/18 1149  BP: 120/67 104/69 114/69 (!) 95/54  Pulse: 89 65 71 63  Resp: 18 18    Temp: 97.6 F (36.4 C) (!) 97.5 F (36.4 C)  (!) 97.4 F (36.3 C)  TempSrc: Oral Oral  Oral  SpO2: 94% 92%  100%  Weight:  106.5 kg    Height:        General exam: Appears calm and comfortable.  No shortness of breath   The results of significant diagnostics from this hospitalization (including imaging, microbiology, ancillary and laboratory) are listed below for reference.     Procedures and Diagnostic Studies:   Dg Chest 2 View  Result Date: 05/11/2018 CLINICAL DATA:  Congestive heart failure.  Atrial fibrillation. EXAM: CHEST - 2 VIEW COMPARISON:  03/31/2018 FINDINGS: Chronic cardiomegaly. Chronic aortic atherosclerosis. Pacemaker leads appear the same. Pulmonary vascularity within normal limits. No interstitial or alveolar edema. No visible effusion. No significant bone finding. IMPRESSION: Chronic cardiomegaly and aortic atherosclerosis. Pacemaker. No active process otherwise. Electronically Signed   By: Nelson Chimes M.D.   On: 05/11/2018 13:32   Ct Head Wo Contrast  Result Date: 05/11/2018 CLINICAL DATA:  Increased lethargy with altered mental status EXAM: CT  HEAD WITHOUT CONTRAST TECHNIQUE: Contiguous axial images were obtained from the base of the skull through the vertex without intravenous contrast. COMPARISON:  March 14, 2017 FINDINGS: Brain: Mild to moderate diffuse atrophy is stable. There is no intracranial mass, hemorrhage, extra-axial fluid collection, or midline shift. There is small vessel disease throughout much of the centra semiovale bilaterally. Small vessel  disease is noted in each external capsule anteriorly. There is small vessel disease in the left thalamus. No acute infarct is evident. Vascular: There is no hyperdense vessel. There is calcification in each carotid siphon region, somewhat more on the left than on the right. Skull: The bony calvarium appears intact. Sinuses/Orbits: There is mucosal thickening in several ethmoid air cells bilaterally. Other visualized paranasal sinuses are clear. Orbits appear symmetric bilaterally. Other: Visualized mastoid air cells are clear. IMPRESSION: Atrophy with extensive supratentorial small vessel disease. No acute infarct evident. No mass or hemorrhage. There are foci of arterial vascular calcification. Mucosal thickening noted in several ethmoid air cells. Electronically Signed   By: Lowella Grip III M.D.   On: 05/11/2018 13:56     Labs:   Basic Metabolic Panel: Recent Labs  Lab 05/11/18 1238 05/12/18 0228 05/13/18 0409 05/14/18 0811  NA 141 142 141 140  K 3.7 3.6 3.6 3.9  CL 105 103 104 104  CO2 23 27 26 24   GLUCOSE 194* 119* 136* 129*  BUN 19 19 23  26*  CREATININE 1.84* 1.73* 1.76* 1.78*  CALCIUM 8.9 9.0 8.8* 8.9   GFR Estimated Creatinine Clearance: 31.6 mL/min (A) (by C-G formula based on SCr of 1.78 mg/dL (H)). Liver Function Tests: Recent Labs  Lab 05/11/18 1238 05/12/18 0228  AST 22 18  ALT 14 13  ALKPHOS 102 91  BILITOT 1.7* 1.5*  PROT 7.6 6.6  ALBUMIN 3.4* 3.1*   No results for input(s): LIPASE, AMYLASE in the last 168 hours. Recent Labs  Lab 05/11/18 2027  AMMONIA 36*   Coagulation profile Recent Labs  Lab 05/11/18 1238 05/12/18 0859 05/13/18 0409 05/14/18 0518  INR 2.14 2.15 2.32 2.07    CBC: Recent Labs  Lab 05/11/18 1238 05/12/18 0228 05/13/18 0409 05/14/18 0811  WBC 4.4 4.7 4.9 4.3  HGB 12.6 11.8* 12.1 12.6  HCT 41.7 38.4 39.1 41.9  MCV 92.7 90.4 90.5 91.7  PLT 234 219 245 267   Cardiac Enzymes: Recent Labs  Lab 05/11/18 1238  05/11/18 2027 05/12/18 0228 05/12/18 0859  TROPONINI <0.03 <0.03 <0.03 <0.03   BNP: Invalid input(s): POCBNP CBG: Recent Labs  Lab 05/13/18 1152 05/13/18 1620 05/13/18 2006 05/14/18 0752 05/14/18 1124  GLUCAP 177* 165* 115* 132* 195*   D-Dimer No results for input(s): DDIMER in the last 72 hours. Hgb A1c No results for input(s): HGBA1C in the last 72 hours. Lipid Profile No results for input(s): CHOL, HDL, LDLCALC, TRIG, CHOLHDL, LDLDIRECT in the last 72 hours. Thyroid function studies Recent Labs    05/11/18 2027  TSH 1.906   Anemia work up Recent Labs    05/11/18 2027  [place] 3,076*   Microbiology Recent Results (from the past 240 hour(s))  Urine culture     Status: None   Collection Time: 05/11/18  4:10 PM  Result Value Ref Range Status   Specimen Description URINE, RANDOM  Final   Special Requests NONE  Final   Culture   Final    NO GROWTH Performed at [facility], [address] [place], [place] [address]    Report Status 05/12/2018  FINAL  Final     Discharge Instructions:   Discharge Instructions    Diet - low sodium heart healthy   Complete by:  As directed    Diet Carb Modified   Complete by:  As directed    Increase activity slowly   Complete by:  As directed      Allergies as of 05/14/2018      Reactions   Amiodarone Shortness Of Breath, Nausea Only   "Toxicity," also   Flecainide Shortness Of Breath, Other (See Comments)   "Toxicity," also   Morphine And Related Nausea And Vomiting, Other (See Comments)   Family requests no morphine due to past reaction. Pt stated it made her sick, severe nausea and vomiting   Bactrim Ds [sulfamethoxazole-trimethoprim] Diarrhea   Cephalexin Nausea And Vomiting   Pineapple Itching   Severe itching      Medication List    STOP taking these medications   ALPRAZolam 0.25 MG tablet Commonly known as:  XANAX   nitrofurantoin (macrocrystal-monohydrate) 100 MG capsule Commonly known as:   MACROBID     TAKE these medications   acetaminophen 325 MG tablet Commonly known as:  TYLENOL Take 650 mg by mouth every 6 (six) hours as needed for mild pain.   bethanechol 25 MG tablet Commonly known as:  URECHOLINE Take 25 mg by mouth 2 (two) times daily.   blood glucose meter kit and supplies Kit Dispense based on patient and insurance preference. Use before breakfast and at bedtime. Diagnosis code: E11.9   furosemide 80 MG tablet Commonly known as:  LASIX TAKE 1 TABLET (80 MG TOTAL) BY MOUTH 2 (TWO) TIMES DAILY.   gabapentin 300 MG capsule Commonly known as:  NEURONTIN Take 1-2 capsules (300-600 mg total) by mouth See admin instructions. Take 600 mg by mouth in the morning and 300 mg in the evening   hydrOXYzine 25 MG tablet Commonly known as:  ATARAX/VISTARIL Take 0.5-1 tablets (12.5-25 mg total) by mouth every 8 (eight) hours as needed for anxiety or itching. What changed:  reasons to take this   memantine 5 MG tablet Commonly known as:  NAMENDA TAKE 1 TABLET BY MOUTH TWICE A DAY   metoprolol succinate 25 MG 24 hr tablet Commonly known as:  TOPROL-XL Take 3 tablets (75 mg total) by mouth daily. Start taking on:  05/15/2018 What changed:    how much to take  additional instructions   NAUZENE [phone] MG Chew Generic drug:  Dextrose-Fructose-Sod Citrate Chew 1 tablet by mouth as needed (for nausea).   potassium chloride 20 MEQ/15ML (10%) Soln Take 20 mEq by mouth daily as needed (if have not eaten a banana).   promethazine 25 MG tablet Commonly known as:  PHENERGAN Take 1 tablet (25 mg total) by mouth every 8 (eight) hours as needed for nausea or vomiting.   pyridoxine 200 MG tablet Commonly known as:  B-6 Take 200 mg by mouth daily.   triamcinolone cream 0.1 % Commonly known as:  KENALOG Apply 1 application topically 2 (two) times daily.   vitamin B-12 500 MCG tablet Commonly known as:  CYANOCOBALAMIN Take 500 mcg by mouth daily.   warfarin  2.5 MG tablet Commonly known as:  COUMADIN Take as directed. If you are unsure how to take this medication, talk to your nurse or doctor. Original instructions:  TAKE 1/2 TABLET TO 1 TABLET DAILY OR AS DIRECTED BY COUMADIN CLINIC What changed:  See the new instructions.      Follow-up Information  Hoyt Koch, MD. Go on 05/22/2018.   Specialty:  Internal Medicine Why:  @2pm  Contact information: [place] [id number] [phone]        Thompson Grayer, MD Follow up.   Specialty:  Cardiology Contact information: [place] [address] [phone]            Time coordinating discharge: 25 min  Signed:  Geradine Girt  [facility] 05/14/2018, 11:57 AM

## 2018-05-14 NOTE — Progress Notes (Signed)
Patient discharged: Home with family after ambulating   Via: Wheelchair   Discharge paperwork given: to patient and family  Reviewed with teach back  IV and telemetry disconnected  Belongings given to patient   Venetia Night, RN

## 2018-05-14 NOTE — Progress Notes (Addendum)
Progress Note  Patient Name: [name] [name] Date of Encounter: 05/14/2018  Primary Cardiologist: Dr. Rayann Heman  Subjective   Patient napping and pleasant upon waking.   Denies chest pain, chest pressure, racing heart rate, palpitations, SOB, n/v/d, HA, syncope, or near syncope symptoms. Only c/o "itchy back," which has reportedly improved since admission. Otherwise, no complaints.  Inpatient Medications    Scheduled Meds: . bethanechol  25 mg Oral BID  . furosemide  80 mg Oral BID  . gabapentin  600 mg Oral Daily   And  . gabapentin  300 mg Oral QHS  . insulin aspart  0-5 Units Subcutaneous QHS  . insulin aspart  0-9 Units Subcutaneous TID WC  . memantine  5 mg Oral BID  . metoprolol succinate  75 mg Oral Daily  . pyridoxine  200 mg Oral Daily  . sodium chloride flush  3 mL Intravenous Q12H  . vitamin B-12  500 mcg Oral Daily  . warfarin  1.25 mg Oral Once per day on Tue Thu Sat  . warfarin  2.5 mg Oral Once per day on Sun Mon Wed Fri  . Warfarin - Pharmacist Dosing Inpatient   Does not apply q1800   Continuous Infusions: . sodium chloride     PRN Meds: sodium chloride, acetaminophen **OR** acetaminophen, camphor-menthol, hydrocortisone cream, promethazine, sodium chloride flush   Vital Signs    Vitals:   05/13/18 1011 05/13/18 1209 05/13/18 1949 05/14/18 0430  BP:  104/65 120/67 104/69  Pulse:  78 89 65  Resp: 19 19 18 18   Temp:  (!) 97.5 F (36.4 C) 97.6 F (36.4 C) (!) 97.5 F (36.4 C)  TempSrc:  Oral Oral Oral  SpO2: 93% 96% 94% 92%  Weight:    106.5 kg  Height:        Intake/Output Summary (Last 24 hours) at 05/14/2018 0905 Last data filed at 05/14/2018 0743 Gross per 24 hour  Intake 817.42 ml  Output 1000 ml  Net -182.58 ml   Filed Weights   05/12/18 0351 05/13/18 0505 05/14/18 0430  Weight: 105.6 kg 106.5 kg 106.5 kg    Telemetry    IRIR, 80s-low 100s with intermittent PVCs - Personally Reviewed  ECG    N/A - Personally  Reviewed  Physical Exam   Physical Exam  Constitutional: She is oriented to person, place, and time. She appears well-developed and well-nourished. No distress.  Elderly female  HENT:  Head: Normocephalic and atraumatic.  Neck: Normal range of motion.  Cardiovascular: Exam reveals no gallop and no friction rub.  No murmur heard. IRIR  Pulmonary/Chest: Effort normal and breath sounds normal. No stridor. No respiratory distress. She has no wheezes. She has no rales. She exhibits no tenderness.  Abdominal: Soft. Bowel sounds are normal. She exhibits no distension and no mass. There is no tenderness. There is no rebound and no guarding.  Musculoskeletal: Normal range of motion. She exhibits edema. She exhibits no tenderness or deformity.  Trivial ankle edema noted  Neurological: She is alert and oriented to person, place, and time.  Skin: Skin is warm and dry. No rash noted. She is not diaphoretic. No erythema. No pallor.  Psychiatric: She has a normal mood and affect. Her behavior is normal. Judgment and thought content normal.     Labs    Chemistry Recent Labs  Lab 05/11/18 1238 05/12/18 0228 05/13/18 0409  NA 141 142 141  K 3.7 3.6 3.6  CL 105 103 104  CO2 23 27 26  GLUCOSE 194* 119* 136*  BUN 19 19 23   CREATININE 1.84* 1.73* 1.76*  CALCIUM 8.9 9.0 8.8*  PROT 7.6 6.6  --   ALBUMIN 3.4* 3.1*  --   AST 22 18  --   ALT 14 13  --   ALKPHOS 102 91  --   BILITOT 1.7* 1.5*  --   GFRNONAA 24* 26* 25*  GFRAA 28* 30* 29*  ANIONGAP 13 12 11      Hematology Recent Labs  Lab 05/11/18 1238 05/12/18 0228 05/13/18 0409  WBC 4.4 4.7 4.9  RBC 4.50 4.25 4.32  HGB 12.6 11.8* 12.1  HCT 41.7 38.4 39.1  MCV 92.7 90.4 90.5  MCH 28.0 27.8 28.0  MCHC 30.2 30.7 30.9  RDW 15.2 15.2 15.2  PLT 234 219 245    Cardiac Enzymes Recent Labs  Lab 05/11/18 1238 05/11/18 2027 05/12/18 0228 05/12/18 0859  TROPONINI <0.03 <0.03 <0.03 <0.03   No results for input(s): TROPIPOC in the  last 168 hours.   BNP Recent Labs  Lab 05/11/18 1238  BNP 581.6*     DDimer No results for input(s): DDIMER in the last 168 hours.   Radiology    No results found.  Cardiac Studies   05/13/2018 TTE *RVSP consistent with mild pulmonary HTN * Recommendations:  Definity contrast study to accurate EF and wall motion assessment. -Left ventricle: Overall systolic function appears reduced but many views are foreshortened and   poor quality. Recommend definity contrast study. Images were  inadequate for LV wall motion assessment. The study was not technically sufficient to allow evaluation of LV diastolic dysfunction due to atrial fibrillation. - Aortic valve: Trileaflet; moderately thickened, mildly calcified  leaflets. There was mild regurgitation.  - Mitral valve: Calcified annulus. There was mild to moderate regurgitation.  - Right ventricle: The cavity size was severely dilated. Wall thickness was normal. Systolic function was moderately reduced. - Right atrium: The atrium was severely dilated. - Tricuspid valve: There was moderate regurgitation. - Pulmonic valve: There was trivial regurgitation. - Pulmonary arteries: PA peak pressure: 36 mm Hg (S).  03/15/2017 TTE - Left ventricle: The cavity size was normal. Wall thickness was   increased in a pattern of mild LVH. Systolic function was normal.   The estimated ejection fraction was in the range of 60% to 65%.   Wall motion was normal; there were no regional wall motion   abnormalities. Features are consistent with a pseudonormal left   ventricular filling pattern, with concomitant abnormal relaxation   and increased filling pressure (grade 2 diastolic dysfunction).   Doppler parameters are consistent with high ventricular filling   pressure. - Aortic valve: There was mild regurgitation. Valve area (VTI):   2.22 cm^2. Valve area (Vmax): 2.44 cm^2. Valve area (Vmean): 2.14   cm^2. - Mitral valve: Mildly calcified annulus.  Mildly thickened leaflets   . There was mild regurgitation. Valve area by continuity equation   (using LVOT flow): 2.06 cm^2. - Left atrium: The atrium was moderately to severely dilated. - Right ventricle: The cavity size was mildly dilated. - Right atrium: The atrium was moderately dilated. - Atrial septum: No defect or patent foramen ovale was identified. - Tricuspid valve: There was moderate-severe regurgitation. - Pulmonary arteries: Systolic pressure was moderately increased.   PA peak pressure: 61 mm Hg (S).  Patient Profile     84 y.o. female with PMH of PAF, Breast Ca, Chronic diastolic HF, s/p pacemaker, HTN, PE, DM and peripheral neuropathy who presented with  AMS and AFib RVR.  Assessment & Plan    1. Afib with RVR - Asymptomatic. - Presented with AMS and elevated HR from PCP and treated for UTI.   - Dr. Rayann Heman as outpatient. Afib & s/p pacemaker for bradycardia as below. - Taking Coumadin, therapeutic. Amiodarone and Flecainide in past, stopped 2/2 SOB. - Echo results as above with recommendation for definity contrast study for accurate EF. Current estimates are reduced systolic function and RVSP consistent with mild pulmonary HTN. Dilated RV, RA. Results do not indicate enlarged atrium. ? DCCV. - Titrate medical management for rate control. - Rates improved today 8/15: Current HR ~mid 60s- high 80s on  blocker. BP 104-120 / 65-69. - Continue Coumadin, per pharmacy. - Continue Toprol XL 75mg  po qd. Future titration if BP allows, current pressures borderline / soft.   2. UTI - Macrobid as outpatient by PCP.  - UA improved on admission.  3. Chronic Diastolic HF - BNP 662.9 (4/76). - Continue po lasix 80mg  BID po. - K+ 3.7  3.6  3.6 on 8/14.     Pending 8/15 BMP. - Cr 1.84  1.73  1.76 on 8/14.    Pending 8/15 BMP. - Strict I/O. - Continue to monitor renal function and electrolytes with diuresis and h/o recent UTI.   4. Essential HTN - Stable as above in #1. -  Continue medical management   4. H/o PE - Continue Coumadin per pharmacy  6. Symptomatic Bradycardia s/p PPM - Dr. Rayann Heman follows as outpatient - Demand pacing noted   For questions or updates, please contact [facility] Please consult [email or web address] for contact info under Cardiology/STEMI.      Dorthula Nettles, PA-C  Pager [phone] 05/14/2018, 9:05 AM     Patient seen and examined. Agree with assessment and plan. AF rate controlled in the 70s on increased Toprol. On warfarin anticoagulation INR 2.07. No chest pain or dyspnea. OK from cardiology perspective to dc today and F/U with Dr. Rayann Heman.   Troy Sine, MD, [facility] 05/14/2018 11:39 AM

## 2018-05-14 NOTE — Care Management Note (Signed)
Case Management Note  Patient Details  Name: [name] [name] MRN: [id number] Date of Birth: [birth year]  Subjective/Objective:     Altered Mental Status              Action/Plan: CM talked to patient at the bedside; lives at home alone, her daughter checks on her often, assist with errands; PCP is Dr Leighton Ruff; has private insurance with [employer] with prescription drug coverage; pharmacy of choice is [facility]; DME - walker and cane at home; [facility] choice offered, pt chose [facility]; Dan with [facility] called for arrangements;  Expected Discharge Date:  05/14/18               Expected Discharge Plan:  [place]  Discharge planning Services  CM Consult Choice offered to:  Patient  HH Arranged:  RN, PT [facility] Agency:  [facility]  Status of Service:  In process, will continue to follow  Sherrilyn Rist [phone] 05/14/2018, 12:38 PM

## 2018-05-14 NOTE — Progress Notes (Signed)
Physical Therapy Treatment Patient Details Name: [name] [name] MRN: [id number] DOB: [birth year] Today's Date: 05/14/2018    History of Present Illness [name] [name] is a 84 y.o F who presented to the ED with confusion, weakness, and SOB. Per notes, recent diagnosis of UTI. PMH includes A-Fib, PE, DVT, T2DM, Breast Cx, CHF, neuropathy, HTN, CRD, CKD, Osteopenia.     PT Comments    Pt progressing with therapy demonstrating improved safety and balance no longer hands on guarding required this session. Supervision level. Still concerned for patients cognition and overall safety awareness, feel she should have 24/7 supervision at home for safety. Plans to d/c this afternoon, no family present to discuss. HHPT will be beneficial to increase safety.    Follow Up Recommendations  Supervision/Assistance - 24 hour;Home health PT     Equipment Recommendations  None recommended by PT    Recommendations for Other Services OT consult     Precautions / Restrictions Precautions Precautions: Fall Restrictions Weight Bearing Restrictions: No    Mobility  Bed Mobility               General bed mobility comments: OOB at entry  Transfers Overall transfer level: Needs assistance Equipment used: Rolling walker (2 wheeled) Transfers: Sit to/from Stand Sit to Stand: Supervision         General transfer comment: cueing for safety, easily distracted  Ambulation/Gait Ambulation/Gait assistance: Supervision Gait Distance (Feet): 150 Feet Assistive device: Rolling walker (2 wheeled) Gait Pattern/deviations: Step-through pattern;Decreased stride length;Wide base of support     General Gait Details: supervision, SpO2 WNL on RA, pt reports feeling SOB, DOE 1/4, returned to room. better avoidence of obstacles, overall supervision level for safety   Stairs             Wheelchair Mobility    Modified Rankin (Stroke Patients Only)       Balance Overall balance assessment:  Needs assistance Sitting-balance support: No upper extremity supported;Feet supported Sitting balance-Leahy Scale: Good     Standing balance support: No upper extremity supported;During functional activity Standing balance-Leahy Scale: Fair Standing balance comment: 0 hand support grooming tasks with supervision at sink                             Cognition Arousal/Alertness: Awake/alert Behavior During Therapy: WFL for tasks assessed/performed Overall Cognitive Status: Impaired/Different from baseline Area of Impairment: Attention;Memory;Safety/judgement;Following commands;Awareness;Problem solving                   Current Attention Level: Selective Memory: Decreased short-term memory Following Commands: Follows multi-step commands with increased time Safety/Judgement: Decreased awareness of safety;Decreased awareness of deficits Awareness: Emergent Problem Solving: Slow processing;Requires verbal cues General Comments: short blessed test completed, scoring 19/28 (significant impairment) noted in areas of sequencing and short term memory      Exercises      General Comments        Pertinent Vitals/Pain Pain Assessment: No/denies pain    Home Living                      Prior Function            PT Goals (current goals can now be found in the care plan section) Acute Rehab PT Goals Patient Stated Goal: To go home and play with her dog PT Goal Formulation: With patient Time For Goal Achievement: 05/21/18 Potential to Achieve Goals: Good Progress towards PT goals: Progressing  toward goals    Frequency    Min 3X/week      PT Plan Current plan remains appropriate    Co-evaluation              AM-PAC PT "6 Clicks" Daily Activity  Outcome Measure  Difficulty turning over in bed (including adjusting bedclothes, sheets and blankets)?: A Little Difficulty moving from lying on back to sitting on the side of the bed? : A  Little Difficulty sitting down on and standing up from a chair with arms (e.g., wheelchair, bedside commode, etc,.)?: Unable Help needed moving to and from a bed to chair (including a wheelchair)?: A Little Help needed walking in hospital room?: A Little Help needed climbing 3-5 steps with a railing? : A Little 6 Click Score: 16    End of Session Equipment Utilized During Treatment: Gait belt Activity Tolerance: Patient tolerated treatment well Patient left: in chair;with call bell/phone within reach;with chair alarm set;with nursing/sitter in room Nurse Communication: Mobility status PT Visit Diagnosis: Other abnormalities of gait and mobility (R26.89);Muscle weakness (generalized) (M62.81);Difficulty in walking, not elsewhere classified (R26.2);Unsteadiness on feet (R26.81)     Time: 1300-1320 PT Time Calculation (min) (ACUTE ONLY): 20 min  Charges:  $Gait Training: 8-22 mins                    Reinaldo Berber, PT, DPT Acute Rehab Services Pager: [phone]     Reinaldo Berber 05/14/2018, 1:23 PM

## 2018-05-14 NOTE — Progress Notes (Signed)
ANTICOAGULATION CONSULT NOTE - Follow Up Consult  Pharmacy Consult for Coumadin Indication: afib, h/o DVT/PE  Allergies  Allergen Reactions  . Amiodarone Shortness Of Breath and Nausea Only    "Toxicity," also  . Flecainide Shortness Of Breath and Other (See Comments)    "Toxicity," also   . Morphine And Related Nausea And Vomiting and Other (See Comments)    Family requests no morphine due to past reaction. Pt stated it made her sick, severe nausea and vomiting  . Bactrim Ds [Sulfamethoxazole-Trimethoprim] Diarrhea  . Cephalexin Nausea And Vomiting  . Pineapple Itching    Severe itching    Patient Measurements: Height: 6' (182.9 cm) Weight: 234 lb 11.2 oz (106.5 kg)(scale b) IBW/kg (Calculated) : 73.1   Vital Signs: Temp: 97.4 F (36.3 C) (08/15 1149) Temp Source: Oral (08/15 1149) BP: 95/54 (08/15 1149) Pulse Rate: 63 (08/15 1149)  Labs: Recent Labs    05/11/18 2027 05/12/18 0228 05/12/18 0859 05/13/18 0409 05/14/18 0518 05/14/18 0811  HGB  --  11.8*  --  12.1  --  12.6  HCT  --  38.4  --  39.1  --  41.9  PLT  --  219  --  245  --  267  LABPROT  --   --  23.8* 25.3* 23.1*  --   INR  --   --  2.15 2.32 2.07  --   CREATININE  --  1.73*  --  1.76*  --  1.78*  TROPONINI <0.03 <0.03 <0.03  --   --   --     Estimated Creatinine Clearance: 31.6 mL/min (A) (by C-G formula based on SCr of 1.78 mg/dL (H)).  Assessment: 36 yof on warfarin PTA for Afib and h/o DVT/PE. INR 2.14 on admission. -PTA regimen: 2.5mg  every Su/M/W/F and 1.25mg  on T/T/Sa  INR today is 2.07. Hgb is 12.6, plt 267. No s/sx of bleeding. Ceftriaxone completed yesterday.  Goal of Therapy:  INR 2-3 Monitor platelets by anticoagulation protocol: Yes   Plan:  Continue home warfarin regimen - plan for 1.25 mg tonight Daily INR  Vertis Kelch, PharmD PGY1 Pharmacy Resident Phone [phone] 05/14/2018       12:15 PM

## 2018-05-14 NOTE — Progress Notes (Signed)
Occupational Therapy Treatment Patient Details Name: [name] [name] MRN: [id number] DOB: 1931-04-12 Today's Date: 05/14/2018    History of present illness [name] is a 84 y.o F who presented to the ED with confusion, weakness, and SOB. Per notes, recent diagnosis of UTI. PMH includes A-Fib, PE, DVT, T2DM, Breast Cx, CHF, neuropathy, HTN, CRD, CKD, Osteopenia.    OT comments  Patient progressing well.  Complete mobility, transfers, and grooming standing at sink using RW with supervision, min cueing for safety awareness.  Completed short blessed test with patient, scoring 19/28 (significant impairment) in areas of short term memory and sequencing, noted decreased attention to tasks as well.  Recommend 24/7 care for safety at discharge.  Will continue to follow.    Follow Up Recommendations  Home health OT;Supervision/Assistance - 24 hour    Equipment Recommendations  None recommended by OT    Recommendations for Other Services      Precautions / Restrictions Precautions Precautions: Fall Restrictions Weight Bearing Restrictions: No       Mobility Bed Mobility               General bed mobility comments: OOB at entry  Transfers Overall transfer level: Needs assistance Equipment used: Rolling walker (2 wheeled) Transfers: Sit to/from Stand Sit to Stand: Supervision         General transfer comment: cueing for safety, easily distracted    Balance Overall balance assessment: Needs assistance Sitting-balance support: No upper extremity supported;Feet supported Sitting balance-Leahy Scale: Good     Standing balance support: No upper extremity supported;During functional activity Standing balance-Leahy Scale: Fair Standing balance comment: 0 hand support grooming tasks with supervision at sink                            ADL either performed or assessed with clinical judgement   ADL Overall ADL's : Needs assistance/impaired     Grooming: Wash/dry  hands;Standing;Supervision/safety Grooming Details (indicate cue type and reason): close stand by for safety, min cueing for problem sovling                  Toilet Transfer: Supervision/safety;Transfer board;RW(simulated to recliner ) [employer] forces technical officer Details (indicate cue type and reason): close stand by for safety          Functional mobility during ADLs: Supervision/safety;Rolling walker General ADL Comments: patient with decreased attention and short term memory affecting safety      Vision       Perception     Praxis      Cognition Arousal/Alertness: Awake/alert Behavior During Therapy: WFL for tasks assessed/performed Overall Cognitive Status: Impaired/Different from baseline Area of Impairment: Attention;Memory;Safety/judgement;Following commands;Awareness;Problem solving                   Current Attention Level: Selective Memory: Decreased short-term memory Following Commands: Follows multi-step commands with increased time Safety/Judgement: Decreased awareness of safety;Decreased awareness of deficits Awareness: Emergent Problem Solving: Slow processing;Requires verbal cues General Comments: short blessed test completed, scoring 19/28 (significant impairment) noted in areas of sequencing and short term memory        Exercises     Shoulder Instructions       General Comments      Pertinent Vitals/ Pain       Pain Assessment: No/denies pain  Home Living  Prior Functioning/Environment              Frequency  Min 2X/week        Progress Toward Goals  OT Goals(current goals can now be found in the care plan section)  Progress towards OT goals: Progressing toward goals  Acute Rehab OT Goals Patient Stated Goal: To go home and play with her dog OT Goal Formulation: With patient Time For Goal Achievement: 05/27/18 Potential to Achieve Goals: Good  Plan Discharge plan  remains appropriate;Frequency remains appropriate    Co-evaluation                 AM-PAC PT "6 Clicks" Daily Activity     Outcome Measure   Help from another person eating meals?: None Help from another person taking care of personal grooming?: A Little Help from another person toileting, which includes using toliet, bedpan, or urinal?: A Little Help from another person bathing (including washing, rinsing, drying)?: A Little Help from another person to put on and taking off regular upper body clothing?: A Little Help from another person to put on and taking off regular lower body clothing?: A Little 6 Click Score: 19    End of Session Equipment Utilized During Treatment: Rolling walker  OT Visit Diagnosis: Unsteadiness on feet (R26.81);Muscle weakness (generalized) (M62.81);Other symptoms and signs involving cognitive function   Activity Tolerance Patient tolerated treatment well   Patient Left in chair;with call bell/phone within reach;with chair alarm set   Nurse Communication Mobility status        Time: 1021-1173 OT Time Calculation (min): 14 min  Charges: OT General Charges $OT Visit: 1 Visit OT Treatments $Self Care/Home Management : 8-22 mins  Delight Stare, OTR/L  Pager [place] 05/14/2018, 12:39 PM

## 2018-05-14 NOTE — Progress Notes (Signed)
Patient with D/C order. Awaiting on patient to reach her daughter at work so she can  pick her up.   Camilia Caywood, RN

## 2018-05-14 NOTE — Plan of Care (Signed)
  Problem: Clinical Measurements: Goal: Ability to maintain clinical measurements within normal limits will improve Outcome: Progressing   Problem: Coping: Goal: Level of anxiety will decrease Outcome: Progressing   

## 2018-05-14 NOTE — Progress Notes (Signed)
HR seems better controlled today with adjustment of medications 1.  ambulate 2.  Plan to d/c if ok with cardiology [name] [name]

## 2018-05-15 ENCOUNTER — Telehealth: Payer: Self-pay | Admitting: *Deleted

## 2018-05-15 NOTE — Telephone Encounter (Signed)
Transition Care Management Follow-up Telephone Call   Date discharged? 05/14/18   How have you been since you were released from the hospital? Called pt spoke w/grand-daughter she states pt is doing fine   Do you understand why you were in the hospital? YES   Do you understand the discharge instructions? YES   Where were you discharged to? Home   Items Reviewed:  Medications reviewed: YES  Allergies reviewed: YES  Dietary changes reviewed: YES, heart healthy & carb modified  Referrals reviewed: No referral needed   Functional Questionnaire:   Activities of Daily Living (ADLs):   She states she are independent in the following: bathing and hygiene, feeding, continence, grooming and toileting States they require assistance with the following: ambulation and dressing   Any transportation issues/concerns?: NO   Any patient concerns? NO   Confirmed importance and date/time of follow-up visits scheduled YES, appt 05/26/18  Provider Appointment booked with Dr. Sharlet Salina  Confirmed with patient if condition begins to worsen call PCP or go to the ER.  Patient was given the office number and encouraged to call back with question or concerns.  : YES

## 2018-05-16 IMAGING — DX DG CHEST 2V
2 series · 2 of 2 positions shown · non-contrast
Comparison: Chest radiograph performed 03/14/2017

CLINICAL DATA: Acute onset of shortness of breath. Initial
encounter.

EXAM:
CHEST  2 VIEW

[chest pa]
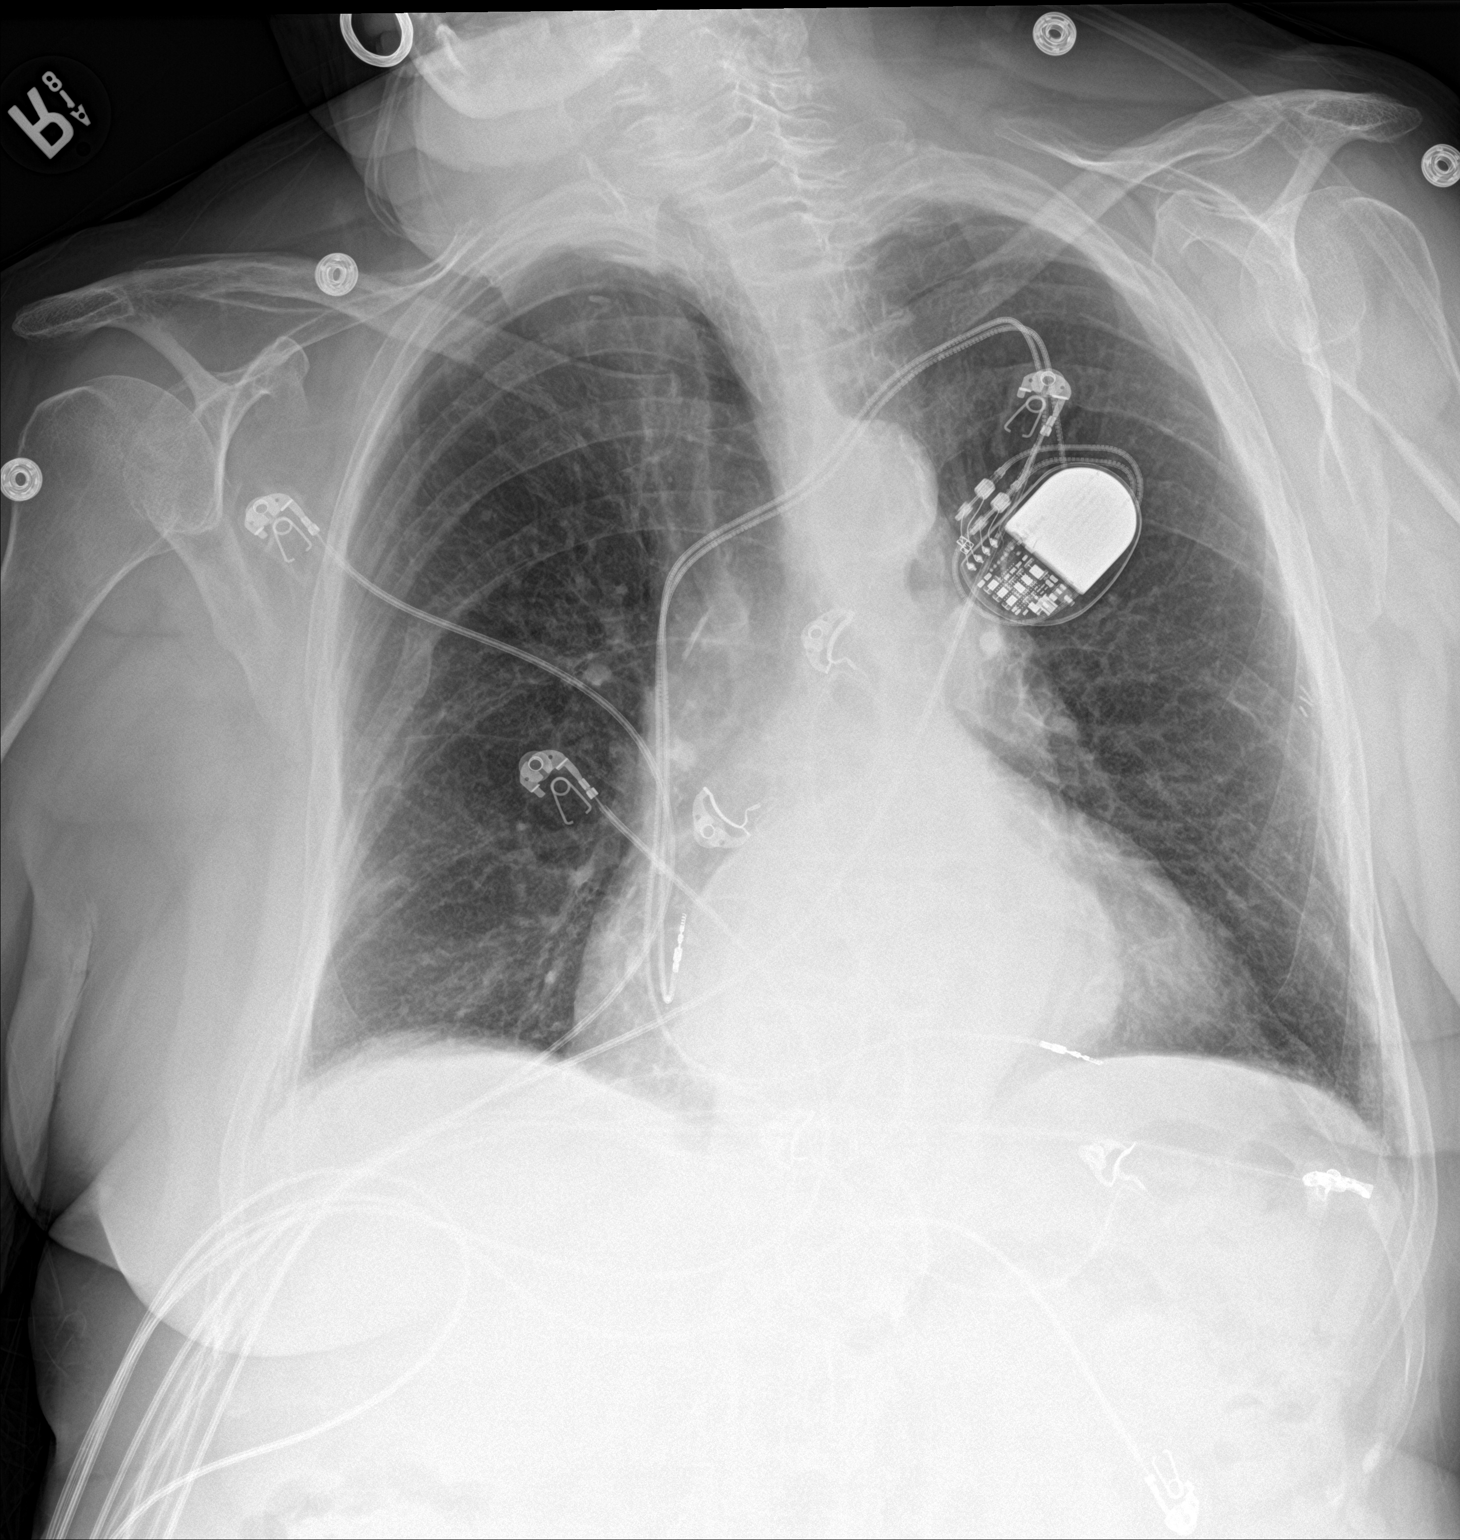

[chest lat]
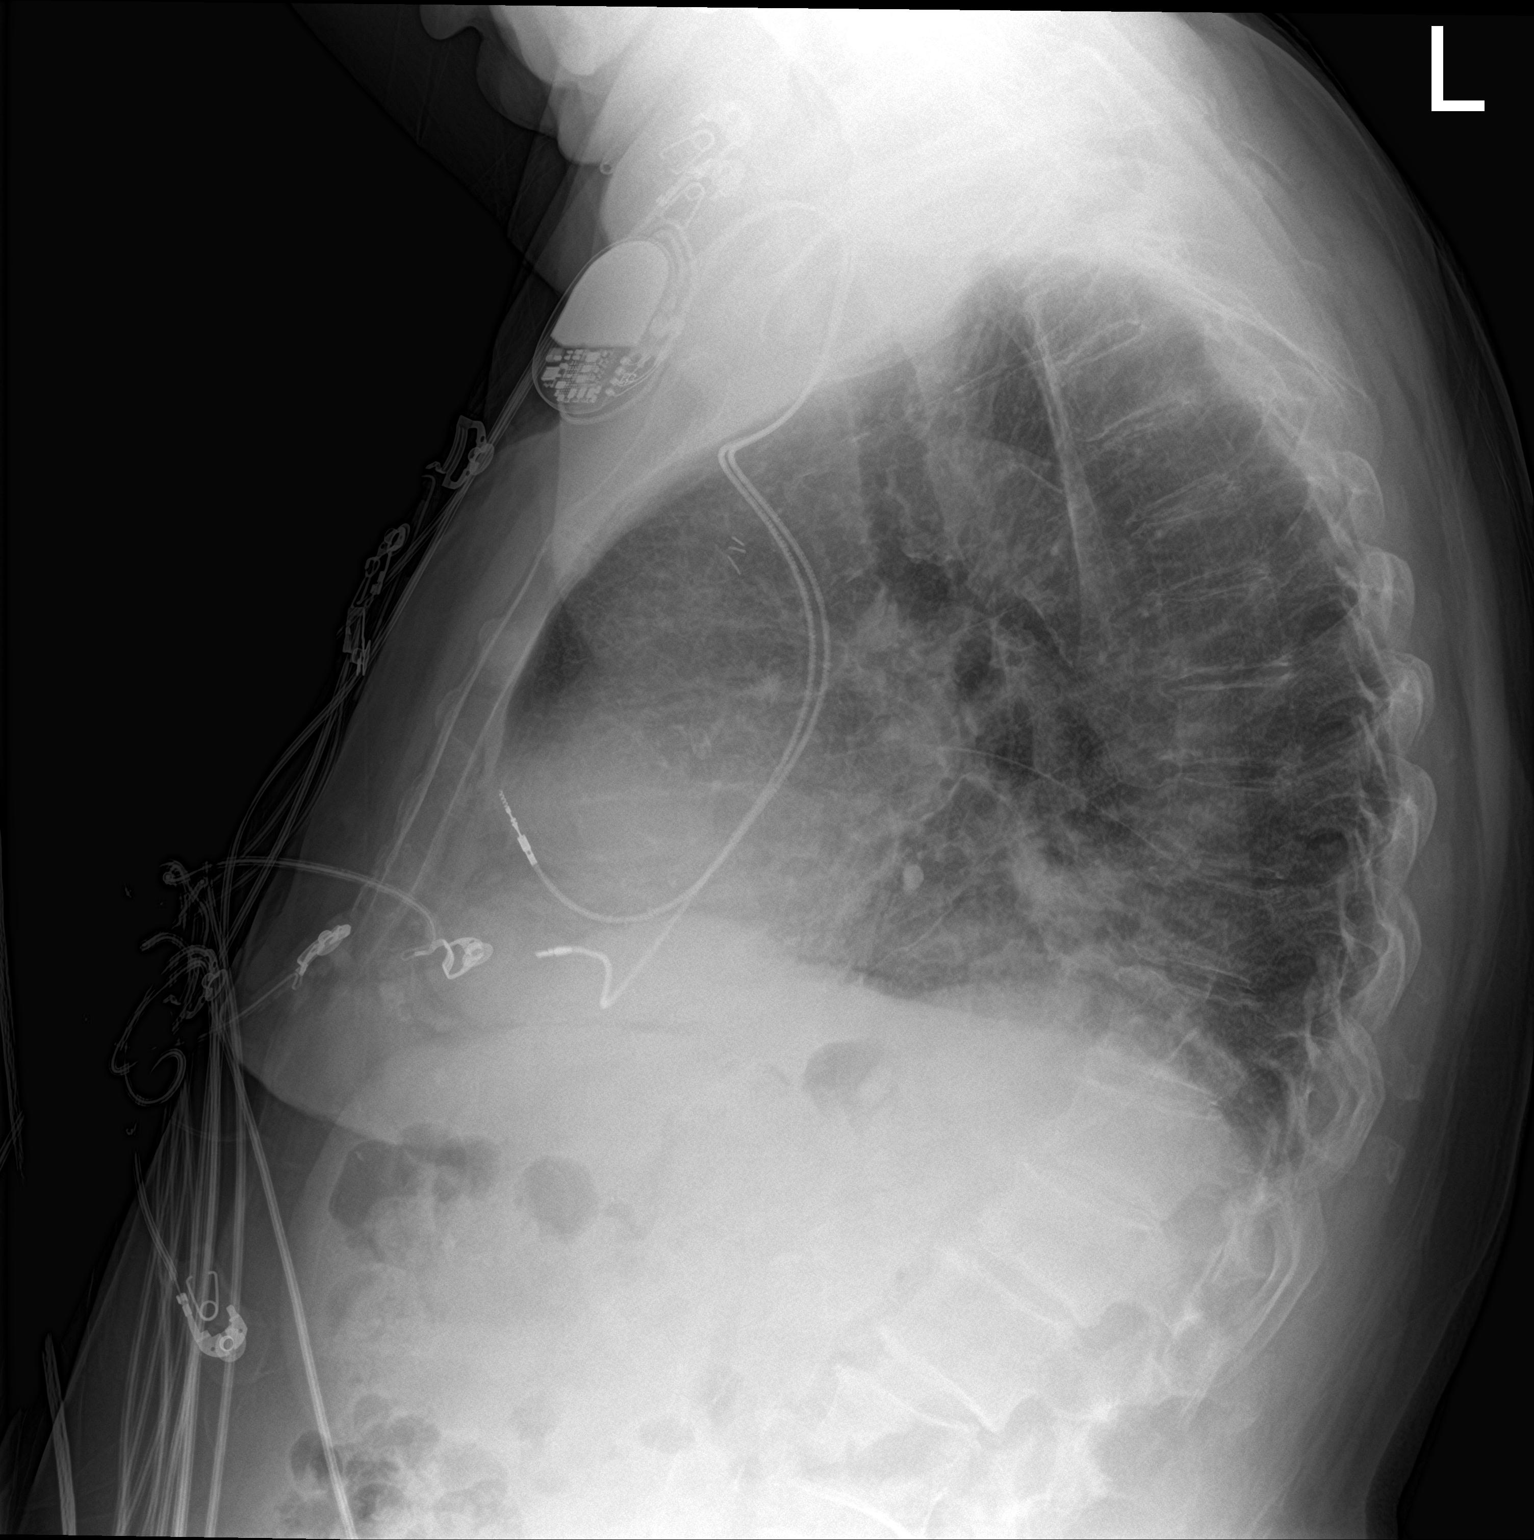

[2 of 2 positions shown; findings below may reference images not displayed]

FINDINGS: The lungs are well-aerated. Mild retrocardiac airspace opacity
raises concern for mild pneumonia, particularly on the lateral view.
There is no evidence of focal opacification, pleural effusion or
pneumothorax.

The heart is mildly enlarged. A pacemaker is noted at the left chest
wall, with leads ending at the right atrium and right ventricle. No
acute osseous abnormalities are seen. Mild chronic right-sided rib
deformities are noted. A moderate to large hiatal hernia is noted.
IMPRESSION: 1. Mild retrocardiac airspace opacity raises concern for mild
pneumonia, particularly on the lateral view.
2. Moderate to large hiatal hernia noted.

## 2018-05-17 IMAGING — DX DG CHEST 1V PORT
1 series · 1 of 1 positions shown · non-contrast
Comparison: 03/18/2017

CLINICAL DATA: Shortness of Breath

EXAM:
PORTABLE CHEST 1 VIEW

[chest ap]
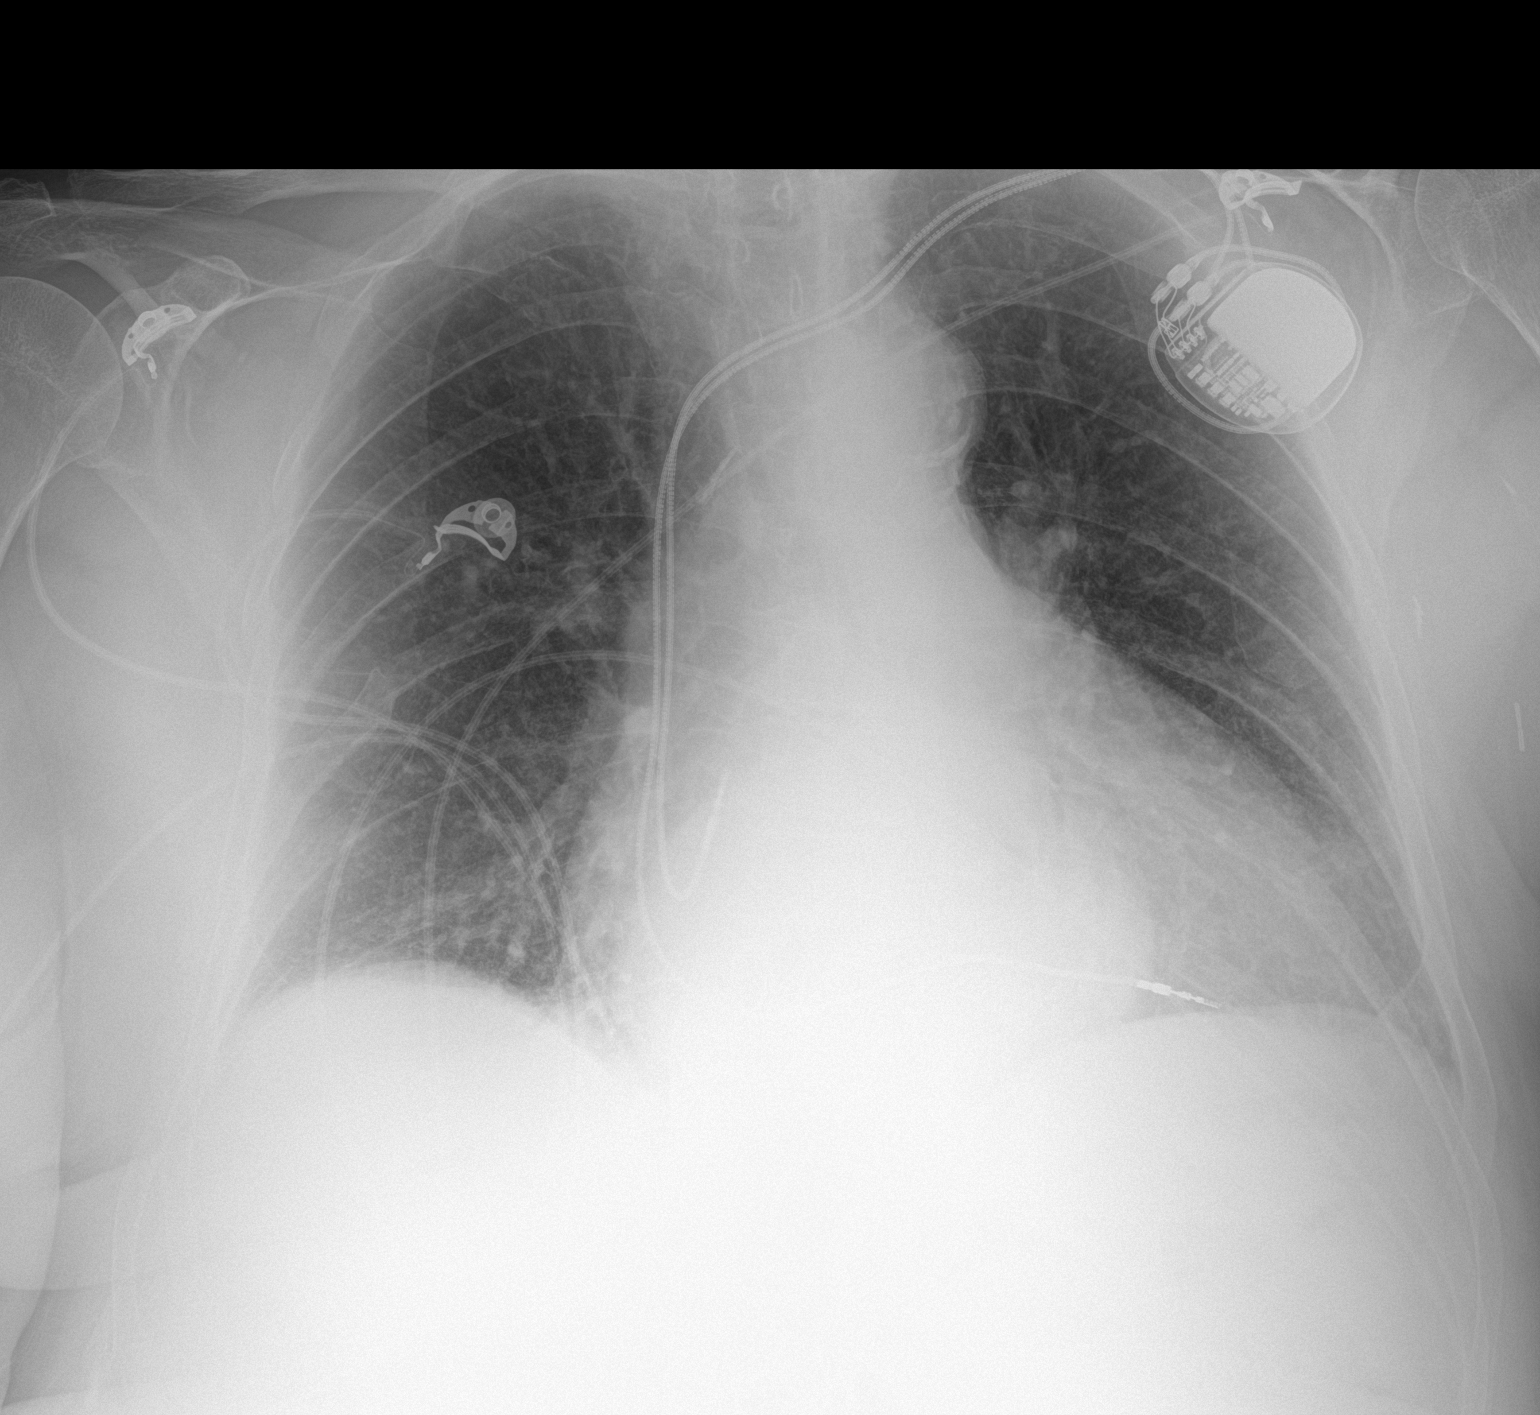

[1 of 1 positions shown; findings below may reference images not displayed]

FINDINGS: Cardiac shadow remains enlarged. A pacing device is again seen.
Large hiatal hernia is again seen. The lungs are well aerated
bilaterally. Old rib fractures are noted on the right.
IMPRESSION: No acute abnormality noted.

## 2018-05-20 ENCOUNTER — Encounter (HOSPITAL_COMMUNITY): Payer: Self-pay

## 2018-05-20 ENCOUNTER — Emergency Department (HOSPITAL_COMMUNITY)
Admission: EM | Admit: 2018-05-20 | Discharge: 2018-05-20 | Disposition: A | Discharge status: Home / Self Care | Payer: Medicare HMO | Attending: Emergency Medicine | Admitting: Emergency Medicine

## 2018-05-20 ENCOUNTER — Ambulatory Visit: Payer: Self-pay | Admitting: *Deleted

## 2018-05-20 ENCOUNTER — Other Ambulatory Visit: Payer: Self-pay

## 2018-05-20 ENCOUNTER — Emergency Department (HOSPITAL_COMMUNITY): Payer: Medicare HMO

## 2018-05-20 DIAGNOSIS — Z79899 Other long term (current) drug therapy: Secondary | ICD-10-CM | POA: Insufficient documentation

## 2018-05-20 DIAGNOSIS — I130 Hypertensive heart and chronic kidney disease with heart failure and stage 1 through stage 4 chronic kidney disease, or unspecified chronic kidney disease: Secondary | ICD-10-CM | POA: Diagnosis not present

## 2018-05-20 DIAGNOSIS — Z87891 Personal history of nicotine dependence: Secondary | ICD-10-CM | POA: Diagnosis not present

## 2018-05-20 DIAGNOSIS — E1122 Type 2 diabetes mellitus with diabetic chronic kidney disease: Secondary | ICD-10-CM | POA: Insufficient documentation

## 2018-05-20 DIAGNOSIS — E1140 Type 2 diabetes mellitus with diabetic neuropathy, unspecified: Secondary | ICD-10-CM | POA: Diagnosis not present

## 2018-05-20 DIAGNOSIS — R54 Age-related physical debility: Secondary | ICD-10-CM | POA: Diagnosis not present

## 2018-05-20 DIAGNOSIS — R000 Tachycardia, unspecified: Secondary | ICD-10-CM | POA: Diagnosis not present

## 2018-05-20 DIAGNOSIS — R0602 Shortness of breath: Secondary | ICD-10-CM | POA: Diagnosis not present

## 2018-05-20 DIAGNOSIS — N184 Chronic kidney disease, stage 4 (severe): Secondary | ICD-10-CM | POA: Insufficient documentation

## 2018-05-20 DIAGNOSIS — I5032 Chronic diastolic (congestive) heart failure: Secondary | ICD-10-CM | POA: Insufficient documentation

## 2018-05-20 DIAGNOSIS — Z7901 Long term (current) use of anticoagulants: Secondary | ICD-10-CM | POA: Diagnosis not present

## 2018-05-20 DIAGNOSIS — L299 Pruritus, unspecified: Secondary | ICD-10-CM | POA: Diagnosis not present

## 2018-05-20 DIAGNOSIS — R5381 Other malaise: Secondary | ICD-10-CM

## 2018-05-20 DIAGNOSIS — I4891 Unspecified atrial fibrillation: Secondary | ICD-10-CM | POA: Diagnosis not present

## 2018-05-20 DIAGNOSIS — L509 Urticaria, unspecified: Secondary | ICD-10-CM | POA: Diagnosis not present

## 2018-05-20 LAB — CBC WITH DIFFERENTIAL/PLATELET
Basophils Absolute: 0.1 10*3/uL (ref 0.0–0.1)
Basophils Relative: 2 %
Eosinophils Absolute: 0.1 10*3/uL (ref 0.0–0.7)
Eosinophils Relative: 2 %
HCT: 40.1 % (ref 36.0–46.0)
Hemoglobin: 12.7 g/dL (ref 12.0–15.0)
Lymphocytes Relative: 20 %
Lymphs Abs: 0.8 10*3/uL (ref 0.7–4.0)
MCH: 28.4 pg (ref 26.0–34.0)
MCHC: 31.7 g/dL (ref 30.0–36.0)
MCV: 89.7 fL (ref 78.0–100.0)
Monocytes Absolute: 0.4 10*3/uL (ref 0.1–1.0)
Monocytes Relative: 10 %
Neutro Abs: 2.5 10*3/uL (ref 1.7–7.7)
Neutrophils Relative %: 66 %
Platelets: 225 10*3/uL (ref 150–400)
RBC: 4.47 MIL/uL (ref 3.87–5.11)
RDW: 15.5 % (ref 11.5–15.5)
WBC: 3.8 10*3/uL — ABNORMAL LOW (ref 4.0–10.5)

## 2018-05-20 LAB — TROPONIN I: Troponin I: <0.03 ng/mL (ref <0.03)

## 2018-05-20 LAB — BASIC METABOLIC PANEL
Anion gap: 10 (ref 5–15)
BUN: 31 mg/dL — ABNORMAL HIGH (ref 8–23)
CO2: 28 mmol/L (ref 22–32)
Calcium: 9.1 mg/dL (ref 8.9–10.3)
Chloride: 106 mmol/L (ref 98–111)
Creatinine, Ser: 1.98 mg/dL — ABNORMAL HIGH (ref 0.44–1.00)
GFR calc Af Amer: 25 mL/min — ABNORMAL LOW (ref >60)
GFR calc non Af Amer: 22 mL/min — ABNORMAL LOW (ref >60)
Glucose, Bld: 168 mg/dL — ABNORMAL HIGH (ref 70–99)
Potassium: 4.1 mmol/L (ref 3.5–5.1)
Sodium: 144 mmol/L (ref 135–145)

## 2018-05-20 LAB — PROTIME-INR
INR: 2.22
Prothrombin Time: 24.5 seconds — ABNORMAL HIGH (ref 11.4–15.2)

## 2018-05-20 LAB — BRAIN NATRIURETIC PEPTIDE: B Natriuretic Peptide: 914.7 pg/mL — ABNORMAL HIGH (ref 0.0–100.0)

## 2018-05-20 MED ORDER — POTASSIUM CHLORIDE CRYS ER 20 MEQ PO TBCR
40.0000 meq | EXTENDED_RELEASE_TABLET | Freq: Once | ORAL | Status: AC
  Administered 2018-05-20: 40 meq via ORAL
  Filled 2018-05-20: qty 2

## 2018-05-20 MED ORDER — DIPHENHYDRAMINE HCL 25 MG PO CAPS
25.0000 mg | ORAL_CAPSULE | Freq: Once | ORAL | Status: AC
  Administered 2018-05-20: 25 mg via ORAL
  Filled 2018-05-20: qty 1

## 2018-05-20 MED ORDER — FUROSEMIDE 10 MG/ML IJ SOLN
60.0000 mg | Freq: Once | INTRAMUSCULAR | Status: AC
  Administered 2018-05-20: 60 mg via INTRAVENOUS
  Filled 2018-05-20: qty 8

## 2018-05-20 NOTE — ED Provider Notes (Signed)
[place] DEPT Provider Note   CSN: [id number] Arrival date & time: 05/20/18  1356     History   Chief Complaint Chief Complaint  Patient presents with  . Shortness of Breath    HPI [name] [name] is a 84 y.o. female.  HPI   87yF sent for evaluation of weakness. Pt is pleasant but not a good historian. She is only complaining of itching which is an ongoing complaint and I've actually seen her recently for this. Discharged on 8/15 after being admitted with afib with mild RVR and hypoxemia.   She denies dyspnea to me. Denies any acute pain. No cough. No fevers or chills. When asked about any difficulty at home getting around she denies this. "I can walk. I'm just not as fast as she (daughter) wants me to be."  Past Medical History:  Diagnosis Date  . Acute gastric ulcer without mention of hemorrhage, perforation, or obstruction 2006   EGD  . Anxiety   . Atrial fibrillation (HCC)    flecainide; coumadin  . Breast cancer ([place])    left  . Carotid artery disease ([place])    11/94: RICA 1-74%, LICA 08-14%  //  [place] 48/18: RICA 5-63%; LICA 14-97% >> FU 1 year   . Chronic diastolic heart failure (HCC)    a. echo 10/10: mild LVH, EF 55-60%, mild AI, mild MR, mild to mod LAE, mild RVE, severe RAE, mod TR, PASP 53  /  b.  Echo 3/14:  Mild LVH, EF 55%, Tr AI, MAC, mild MR, mod LAE, PASP 31, trivial eff. // c. echo 1/17: EF 55-60%, mild AI, MAC, mild MR, moderate BAE, moderate TR  . Chronic lower back pain   . Diabetic peripheral neuropathy ([place]) 03/09/2015  . Diverticulosis   . Esophagitis, unspecified 2006   EGD  . Hiatal hernia 2006   EGD  . History of nuclear stress test    a. Myoview 2/12: EF 69%, no scar or ischemia  . Hx of adenomatous colonic polyps   . Hypertension   . Mediastinal lymphadenopathy   . Memory difficulty 11/14/2016  . Mitral regurgitation    mild, echo, October, 2010  . Osteopenia   . Presence of permanent cardiac  pacemaker   . Pulmonary embolus [facility]) 2006   2006, with DVT  . Pulmonary HTN ([place])    53 mmHg echo, 2010, moderate TR, mild right ventricular enlargement  . Tachy-brady syndrome ([place])    s/p pacer 07/2009  . Thyroid nodule    non-neoplastic goiter  . Type II diabetes mellitus ([place])   . Venous insufficiency    Chronic    Patient Active Problem List   Diagnosis Date Noted  . Altered mental status 05/11/2018  . Acute lower UTI 05/11/2018  . Pruritus 05/08/2018  . Morbid obesity due to excess calories ([place]) complicated by hbp 02/63/7858  . Solitary pulmonary nodule on lung CT 03/21/2017  . Amiodarone toxicity 03/21/2017  . Pacemaker complications 85/11/7739  . CKD (chronic kidney disease), stage IV ([place]) 03/14/2017  . Recurrent falls 01/13/2017  . Cognitive change   . A-fib ([place]) 10/14/2016  . Dysuria 10/10/2016  . Chronic back pain 07/28/2016  . Diabetes mellitus type 2, noninsulin dependent ([place])   . CRD (chronic renal disease), stage III ([place]) 04/12/2016  . Dyspnea on exertion 04/12/2016  . Hypertensive heart disease 03/28/2016  . Anemia, iron deficiency 10/22/2015  . Diabetic peripheral neuropathy ([place]) 03/09/2015  . Acute on chronic diastolic (congestive)  heart failure ([place]) 09/07/2014  . Sick sinus syndrome ([place]) 03/09/2014  . Hypertension 06/21/2013  . Osteopenia   . Pulmonary HTN ([place])   . History of pulmonary embolism   . Carotid artery disease ([place])   . History of breast cancer   . Pacemaker-Medtronic   . Syncope   . Mitral regurgitation   . Mediastinal lymphadenopathy   . THYROID NODULE, HX OF 07/19/2009    Past Surgical History:  Procedure Laterality Date  . ABDOMINAL HYSTERECTOMY    . ANKLE FRACTURE SURGERY Right   . BACK SURGERY    . BREAST LUMPECTOMY Left   . FRACTURE SURGERY    . INSERT / REPLACE / REMOVE PACEMAKER  06/30/2009   MDT Adalpta L implanted by Dr Olevia Perches  . LUMBAR LAMINECTOMY  1973, 06/22/2011   "no hardware either time"     OB  History   None      Home Medications    Prior to Admission medications   Medication Sig Start Date End Date Taking? Authorizing Provider  acetaminophen (TYLENOL) 325 MG tablet Take 650 mg by mouth every 6 (six) hours as needed for mild pain.   Yes [provider]  bethanechol (URECHOLINE) 25 MG tablet Take 25 mg by mouth 2 (two) times daily.   Yes [provider]  Dextrose-Fructose-Sod Citrate Almon Hercules) [phone] MG CHEW Chew 1 tablet by mouth as needed (for nausea).   Yes [provider]  furosemide (LASIX) 80 MG tablet TAKE 1 TABLET (80 MG TOTAL) BY MOUTH 2 (TWO) TIMES DAILY. 01/13/18  Yes Isaiah Serge, NP  gabapentin (NEURONTIN) 300 MG capsule Take 1-2 capsules (300-600 mg total) by mouth See admin instructions. Take 600 mg by mouth in the morning and 300 mg in the evening 05/14/18  Yes Vann, [name] U, DO  memantine (NAMENDA) 5 MG tablet TAKE 1 TABLET BY MOUTH TWICE A DAY 01/20/18  Yes Ward Givens, NP  metoprolol succinate (TOPROL-XL) 25 MG 24 hr tablet Take 3 tablets (75 mg total) by mouth daily. 05/15/18  Yes Eulogio Bear U, DO  pyridoxine (B-6) 200 MG tablet Take 200 mg by mouth daily.   Yes [provider]  triamcinolone cream (KENALOG) 0.1 % Apply 1 application topically 2 (two) times daily. 05/08/18  Yes Hoyt Koch, MD  vitamin B-12 (CYANOCOBALAMIN) 500 MCG tablet Take 500 mcg by mouth daily.    Yes [provider]  warfarin (COUMADIN) 2.5 MG tablet TAKE 1/2 TABLET TO 1 TABLET DAILY OR AS DIRECTED BY COUMADIN CLINIC Patient taking differently: Take 1.25-2.5 mg by mouth See admin instructions. Take 2.5 mg by mouth in the morning on Sunday/Monday/Wednesday/Friday and 1.25 mg on Tuesday/Thursday/Saturday 01/08/18  Yes Dorothy Spark, MD  blood glucose meter kit and supplies KIT Dispense based on patient and insurance preference. Use before breakfast and at bedtime. Diagnosis code: E11.9 03/26/17   Nche, Charlene Brooke, NP    hydrOXYzine (ATARAX/VISTARIL) 25 MG tablet Take 0.5-1 tablets (12.5-25 mg total) by mouth every 8 (eight) hours as needed for anxiety or itching. 05/14/18   Geradine Girt, DO  potassium chloride 20 MEQ/15ML (10%) SOLN Take 20 mEq by mouth daily as needed (if have not eaten a banana).    [provider]  promethazine (PHENERGAN) 25 MG tablet Take 1 tablet (25 mg total) by mouth every 8 (eight) hours as needed for nausea or vomiting. 10/22/16   Hoyt Koch, MD    Family History Family History  Problem Relation Age  of Onset  . Heart disease Mother   . Hypertension Mother   . COPD Father   . Hypertension Father   . Hypertension Sister   . Hypertension Brother   . Hypertension Daughter   . Colon cancer Neg Hx   . Heart attack Neg Hx   . Stroke Neg Hx     Social History Social History   Tobacco Use  . Smoking status: Former Smoker    Packs/day: 1.00    Years: 47.00    Pack years: 47.00    Types: Cigarettes    Last attempt to quit: 10/26/1998    Years since quitting: 19.5  . Smokeless tobacco: Never Used  Substance Use Topics  . Alcohol use: No    Comment: 10/14/2016 "I'll have a beer q once in awhile"  . Drug use: No     Allergies   Amiodarone; Flecainide; Morphine and related; Bactrim ds [sulfamethoxazole-trimethoprim]; Cephalexin; and Pineapple   Review of Systems Review of Systems  All systems reviewed and negative, other than as noted in HPI.  Physical Exam Updated Vital Signs BP 120/66   Pulse 81   Temp 98.2 F (36.8 C) (Oral)   Resp (!) 21   Ht 6' (1.829 m)   Wt 106 kg   SpO2 98%   BMI 31.69 kg/m   Physical Exam  Constitutional: She appears well-developed and well-nourished. No distress.  Laying in bed. NAD. obese  HENT:  Head: Normocephalic and atraumatic.  Eyes: Conjunctivae are normal. Right eye exhibits no discharge. Left eye exhibits no discharge.  Neck: Neck supple.  Cardiovascular: Normal rate and normal heart sounds. Exam  reveals no gallop and no friction rub.  No murmur heard. irreg irreg  Pulmonary/Chest: Effort normal and breath sounds normal. No respiratory distress.  Abdominal: Soft. She exhibits no distension. There is no tenderness.  Musculoskeletal: She exhibits no edema or tenderness.  Neurological: She is alert.  Skin: Skin is warm and dry.  Psychiatric: She has a normal mood and affect. Her behavior is normal. Thought content normal.  Nursing note and vitals reviewed.    ED Treatments / Results  Labs (all labs ordered are listed, but only abnormal results are displayed) Labs Reviewed  BRAIN NATRIURETIC PEPTIDE - Abnormal; Notable for the following components:      Result Value   B Natriuretic Peptide 914.7 (*)    All other components within normal limits  PROTIME-INR - Abnormal; Notable for the following components:   Prothrombin Time 24.5 (*)    All other components within normal limits  BASIC METABOLIC PANEL - Abnormal; Notable for the following components:   Glucose, Bld 168 (*)    BUN 31 (*)    Creatinine, Ser 1.98 (*)    GFR calc non Af Amer 22 (*)    GFR calc Af Amer 25 (*)    All other components within normal limits  CBC WITH DIFFERENTIAL/PLATELET - Abnormal; Notable for the following components:   WBC 3.8 (*)    All other components within normal limits  TROPONIN I    EKG EKG Interpretation  Date/Time:  Wednesday May 20 2018 16:55:37 EDT Ventricular Rate:  106 PR Interval:    QRS Duration: 87 QT Interval:  384 QTC Calculation: 510 R Axis:   96 Text Interpretation:  Atrial fibrillation Right axis deviation Borderline T abnormalities, lateral leads Confirmed by Virgel Manifold [phone]) on 05/20/2018 5:29:45 PM   Radiology Dg Chest 2 View  Result Date: 05/20/2018 CLINICAL DATA:  Weakness.  Shortness of breath. EXAM: CHEST - 2 VIEW COMPARISON:  05/11/2018 FINDINGS: Dual lead pacemaker as seen previously. Chronic cardiomegaly and aortic atherosclerosis. There  probably small effusions in the posterior costophrenic angles. There is a hiatal hernia. Lungs otherwise appear clear. No frank edema. IMPRESSION: Cardiomegaly. Tiny effusions in the posterior costophrenic angles. Hiatal hernia. Electronically Signed   By: Nelson Chimes M.D.   On: 05/20/2018 14:53    Procedures Procedures (including critical care time)  Medications Ordered in ED Medications  furosemide (LASIX) injection 60 mg (60 mg Intravenous Given 05/20/18 1657)  potassium chloride SA (K-DUR,KLOR-CON) CR tablet 40 mEq (40 mEq Oral Given 05/20/18 1656)     Initial Impression / Assessment and Plan / ED Course  I have reviewed the triage vital signs and the nursing notes.  Pertinent labs & imaging results that were available during my care of the patient were reviewed by me and considered in my medical decision making (see chart for details).     87yF with possible dyspnea with exertion? She denies to me. She only complaints of itching. She doesn't clinically appear to be volume overloaded. BNP is up a little, bit otherwise, no LE edema, CXR w/o overt edema, I don't appreciate crackles and weight is down 1 lbs from discharge on 8/15. She is in afib which she has a history of but rate is controlled. EKG with rate of 106 but primarily 60-80 through out ED stay.   5:27 PM Daughters now at bedside. Concerned primarily about ability to take care of herself at home. They can take stay with her overnight but are more concerned about longer term. I think there is a component of deconditioning and she could benefit from home health or possible rehab. Will see if social work can possibly assist with these needs, but otherwise I think she is appropriate for outpt FU.   Final Clinical Impressions(s) / ED Diagnoses   Final diagnoses:  SOB (shortness of breath)  Physical deconditioning    ED Discharge Orders    None       Virgel Manifold, MD 05/21/18 1134

## 2018-05-20 NOTE — ED Notes (Signed)
Per MD order I&O completed. 1121ml approx drained from bladder w/assistance from Carmine NT. Pt tolerated well. MD and RN advised. [employer]

## 2018-05-20 NOTE — ED Triage Notes (Signed)
Pt BIBA from home. Pt is normally ambulatory. Since this AM, [facility] with exertion and unable to walk around house. Pt also complains of "itching" all over- no hives per EMS. No med changes, etc. 90% room air while ambulating vs 99% sitting.

## 2018-05-20 NOTE — ED Notes (Addendum)
Pt's family states she has not urinated since being in the ED. EDP has been notified. Bladder scanned performed and results given to EDP.

## 2018-05-20 NOTE — ED Provider Notes (Signed)
Patient had not made any urine throughout her ED stay.  Family requested in and out catheterization.  Declined urinary catheter and Foley bag at this time.  Patient has a history of the same.  They will follow-up with urology or primary care doctor tomorrow morning if patient fails trial of void tonight at home.  Social work came by to talk to the patient and family and patient was discharged.   Lennice Sites, DO 05/20/18 2022

## 2018-05-20 NOTE — Telephone Encounter (Signed)
Patient's daughter called. Patient is experiencing increased weakness this morning, had nausea all night last night and not eating today. Also experiencing increased jerking and twitching this morning. Patient reported she had a brief episode of CP this morning that has resolved.  Temp. 99.1 at this time. Daughter had to assist her to the bathroom earlier but she wasn't able to void at that time but felt she had to. She has voided since then. She was discharge from the hospital on 05/14/18 after being treated for UTI, Afib with RVR, CHF and decreased 02 sats.  Advised daughter to call for emergency services at this time. Stated she would.  Reason for Disposition . [1] SEVERE weakness (i.e., unable to walk or barely able to walk, requires support) AND     [2] new onset or worsening  Answer Assessment - Initial Assessment Questions 1. DESCRIPTION: "Describe how you are feeling."     Weak-not able to get up by herself. 2. SEVERITY: "How bad is it?"  "Can you stand and walk?"   - MILD - Feels weak or tired, but does not interfere with work, school or normal activities   - [place] to stand and walk; weakness interferes with work, school, or normal activities   - SEVERE - Unable to stand or walk     Moderate. Daughter had to help her to the restroom today.  3. ONSET:  "When did the weakness begin?"     Nausea last night. But weakness this morning.  4. CAUSE: "What do you think is causing the weakness?"     Unsure.  5. MEDICINES: "Have you recently started a new medicine or had a change in the amount of a medicine?"     Metoprolol increased to 75 MG after last week's hospital discharge on 05/14/18. 6. OTHER SYMPTOMS: "Do you have any other symptoms?" (e.g., chest pain, fever, cough, SOB, vomiting, diarrhea, bleeding, other areas of pain)     Mild SOB today , nausea since last night.  7. PREGNANCY: "Is there any chance you are pregnant?" "When was your last menstrual period?"     no  Protocols  used: WEAKNESS (GENERALIZED) AND FATIGUE-A-AH

## 2018-05-20 NOTE — ED Notes (Signed)
Bed: WA21 Expected date:  Expected time:  Means of arrival:  Comments: EMS/shob

## 2018-05-20 NOTE — Progress Notes (Addendum)
CSW spoke to family and pt and counseled both on the requirements for [employer] Medicare to pay for SNF stay.  Family and pt state pt wants to return home.  Family asked that the ED be notified that pt cannot void and request to speak to the EDP.  CSW updated EDP.  CSW will provide family with list for local area SNF's for possible future SNF stays and ALF stays for possible respite care if needed.  CSW will ask EPD to place a consult for CM with an order for face-to-face with RN/Aide/PT/OT and Social Work.   8:44 PM' EPD placed above consult/orders, per EPD.  CSW provided above lists to pt/pt family who were appreciative and thanked the CSW.  Please reconsult if future social work needs arise.  CSW signing off, as social work intervention is no longer needed.  Alphonse Guild. [place] Skipper, LCSW, LCAS, CSI Clinical Social Worker Ph: [phone]

## 2018-05-20 NOTE — Telephone Encounter (Signed)
At ED

## 2018-05-21 ENCOUNTER — Ambulatory Visit (INDEPENDENT_AMBULATORY_CARE_PROVIDER_SITE_OTHER): Payer: Medicare HMO | Admitting: Pharmacist

## 2018-05-21 DIAGNOSIS — I110 Hypertensive heart disease with heart failure: Secondary | ICD-10-CM | POA: Diagnosis not present

## 2018-05-21 DIAGNOSIS — L299 Pruritus, unspecified: Secondary | ICD-10-CM | POA: Diagnosis not present

## 2018-05-21 DIAGNOSIS — I700 Atherosclerosis of aorta: Secondary | ICD-10-CM | POA: Diagnosis not present

## 2018-05-21 DIAGNOSIS — E1142 Type 2 diabetes mellitus with diabetic polyneuropathy: Secondary | ICD-10-CM | POA: Diagnosis not present

## 2018-05-21 DIAGNOSIS — I5032 Chronic diastolic (congestive) heart failure: Secondary | ICD-10-CM | POA: Diagnosis not present

## 2018-05-21 DIAGNOSIS — R339 Retention of urine, unspecified: Secondary | ICD-10-CM | POA: Diagnosis not present

## 2018-05-21 DIAGNOSIS — I779 Disorder of arteries and arterioles, unspecified: Secondary | ICD-10-CM | POA: Diagnosis not present

## 2018-05-21 DIAGNOSIS — N3000 Acute cystitis without hematuria: Secondary | ICD-10-CM | POA: Diagnosis not present

## 2018-05-21 DIAGNOSIS — I480 Paroxysmal atrial fibrillation: Secondary | ICD-10-CM | POA: Diagnosis not present

## 2018-05-21 DIAGNOSIS — Z86711 Personal history of pulmonary embolism: Secondary | ICD-10-CM

## 2018-05-21 DIAGNOSIS — R69 Illness, unspecified: Secondary | ICD-10-CM | POA: Diagnosis not present

## 2018-05-22 ENCOUNTER — Other Ambulatory Visit: Payer: Self-pay

## 2018-05-22 ENCOUNTER — Inpatient Hospital Stay: Payer: Medicare HMO | Admitting: Internal Medicine

## 2018-05-22 NOTE — Patient Outreach (Signed)
[facility]) Care Management  05/22/2018  [name] [name] 18-Oct-1930 [id number]   EMMI- heart failure RED ON EMMI ALERT Day # 2 Date: 05/21/18 Red Alert Reason: Weighed themselves today? no  Outreach attempt: spoke with daughter [name].  Discussed reason for call.  She reports that patient normally weighs daily but she did not yesterday.  She states that patient has been short of breath and mobility has been a challenge.  She states that patient has been in or to the hospital multiple times but patient does not seem to get better.  She is the primary caregiver for patient and expresses concern of patient status.  She states that the therapist from [place] is involved.  She states she has not seen a nurse.   Discussed with her CHF and perimeters to monitor with patient and notify physician as necessary.  .  She verbalized understanding.  She states that patient will see PCP next week and she will be taking her.  Daughter also states that patient has DM but has been taken off diabetic medication due kidney issues.  She also reports patient has some problems with itching that the physicians have not been able to track down the cause.  Discussed possible medication causing problems or possible allergist being involved but due to patient other issues that maybe a challenge.  She verbalized understanding.    Discussed with daughter [facility] services and she is agreeable to community nurse due to patient multiple medical issues, high risk for readmit, and any care coordination needs.     Plan: RN CM will refer to community nurse due to multiple medical issues, high risk for readmit,and for any care coordination needs.   Jone Baseman, RN, MSN [facility] Care Management Care Management Coordinator Direct Line [phone] Toll Free: [phone]  Fax: [phone]

## 2018-05-25 ENCOUNTER — Other Ambulatory Visit: Payer: Self-pay | Admitting: *Deleted

## 2018-05-25 NOTE — Patient Outreach (Signed)
[facility]) Care Management  05/25/2018  [name] [name] [birth year] [id number]   Referral received from telephonic care manager as member has had multiple admissions to the hospital and ED visits for heart failure as well as atrial fibrillation.  Per chart, she also has history of pulmonary hypertension, carotid artery disease, hypertension, sick sinus syndrome, diabetes, and chronic kidney disease.     Call placed to [facility] daughter/caregiver, this care manager introduced self and purpose of call. She report member is not doing too well at this time, has been having concern with itching, no rash, for about a month.  State she has discussed with MD's but have received no answers.  Has Kenalog cream, provides minimal relief.    Continues to weigh member daily (triggered red for EMMI heart failure on 8/25 for no weight, took weight after call).  Weight over the weekend as been 239 pounds, 235 pounds, and 236 pounds.  She has appointment with primary MD tomorrow, will discuss persistent itching as well as follow up on heart failure.    Denies any urgent concerns, agrees to home visit within the next 2 weeks.  Will perform initial assessment and develop individualized plan of care at that time.  Valente David, [place], MSN [place] [phone]

## 2018-05-26 ENCOUNTER — Other Ambulatory Visit (INDEPENDENT_AMBULATORY_CARE_PROVIDER_SITE_OTHER): Payer: Medicare HMO

## 2018-05-26 ENCOUNTER — Encounter: Payer: Self-pay | Admitting: Internal Medicine

## 2018-05-26 ENCOUNTER — Telehealth: Payer: Self-pay | Admitting: Internal Medicine

## 2018-05-26 ENCOUNTER — Ambulatory Visit (INDEPENDENT_AMBULATORY_CARE_PROVIDER_SITE_OTHER): Payer: Medicare HMO | Admitting: Internal Medicine

## 2018-05-26 VITALS — BP 100/60 | HR 71 | Temp 97.5°F | Ht 72.0 in | Wt 237.0 lb

## 2018-05-26 DIAGNOSIS — I5033 Acute on chronic diastolic (congestive) heart failure: Secondary | ICD-10-CM

## 2018-05-26 DIAGNOSIS — L299 Pruritus, unspecified: Secondary | ICD-10-CM

## 2018-05-26 DIAGNOSIS — R0609 Other forms of dyspnea: Secondary | ICD-10-CM

## 2018-05-26 DIAGNOSIS — R350 Frequency of micturition: Secondary | ICD-10-CM

## 2018-05-26 DIAGNOSIS — N390 Urinary tract infection, site not specified: Secondary | ICD-10-CM

## 2018-05-26 LAB — URINALYSIS, ROUTINE W REFLEX MICROSCOPIC
Bilirubin Urine: NEGATIVE
HGB URINE DIPSTICK: NEGATIVE
Ketones, ur: NEGATIVE
Nitrite: NEGATIVE
RBC / HPF: NONE SEEN (ref 0–?)
SPECIFIC GRAVITY, URINE: 1.01 (ref 1.000–1.030)
Total Protein, Urine: NEGATIVE
URINE GLUCOSE: NEGATIVE
Urobilinogen, UA: 0.2 (ref 0.0–1.0)
pH: 7 (ref 5.0–8.0)

## 2018-05-26 MED ORDER — PREGABALIN 100 MG PO CAPS: 100.0000 mg | ORAL_CAPSULE | Freq: Two times a day (BID) | ORAL | 1 refills | Status: DC

## 2018-05-26 NOTE — Patient Instructions (Addendum)
We will check the urine today.   We have given you a sample of anoro to use 1 puff daily. Try it for 1-2 weeks to see if this helps. Call us to let us know how it is going.   We will change you to lyrica from gabapentin. You can switch to lyrica once you get it. Just do not take lyrica within 12 hours of taking gabapentin (I.e. You could take lyrica in the evening if you took gabapentin in the morning or lyrica in the morning after taking gabapentin the evening before).   After 1 week of lyrica we can change the dose if needed. Call to let us know if it is working.

## 2018-05-26 NOTE — Assessment & Plan Note (Signed)
Concern that emphysema/COPD may be playing a larger role than previously suspected. Given sample of anoro to try to see if this helps. She has emphysema on CT lung recent. Could also be coming from higher dose of metoprolol and a fib.

## 2018-05-26 NOTE — Assessment & Plan Note (Signed)
Weight is stable currently and taking meds appropriately.

## 2018-05-26 NOTE — Telephone Encounter (Signed)
Fine

## 2018-05-26 NOTE — Assessment & Plan Note (Signed)
Will change gabapentin to lyrica to see if this helps more with itching and can do as well with her neuropathy.

## 2018-05-26 NOTE — Telephone Encounter (Signed)
Copied from [place] [id number]. Topic: Quick Communication - See Telephone Encounter >> May 26, 2018  1:03 PM Bea Graff, NT wrote: CRM for notification. See Telephone encounter for: 05/26/18. Melinda with [facility] calling to get verbal orders for PT for eval and treatment. Frequency of 1 time a week for 1 week. CB#: [phone] Ext [phone]

## 2018-05-26 NOTE — Progress Notes (Signed)
   Subjective:    Patient ID: [name] [name], female    DOB: 12-21-1930, 84 y.o.   MRN: [id number]  HPI The patient is an 84 YO female coming in for hospital follow up (in for A fib with rvr and altered mental status, treated for uti, held xanax, some urinary retention). She is doing poorly since being home. She has been back to the ER since leaving the hospital. She was there mostly because family was concerned about not being able to take care of her at home. She was evaluated with labs and EKG and no real changes. Weight was stable from hospital discharge. Denies chest pains. She is still itching significantly and this limits her life. She is not able to do ADLs. Her daughter is staying home with her and needs FMLA forms done. She is getting very SOB with activity. So much so that when PT came her BP and oxygen level is normal but she was not able to do anything. She is barely able to walk from bedroom to bathroom or kitchen. She is not able to stay alone currently. Good urine output and no retention.   PMH, [facility], social history reviewed and updated.   Review of Systems  Constitutional: Positive for activity change, appetite change and fatigue. Negative for chills, fever and unexpected weight change.  HENT: Negative.   Eyes: Negative.   Respiratory: Positive for shortness of breath. Negative for cough and chest tightness.   Cardiovascular: Positive for leg swelling. Negative for chest pain and palpitations.  Gastrointestinal: Negative for abdominal distention, abdominal pain, constipation, diarrhea, nausea and vomiting.  Genitourinary: Positive for difficulty urinating.  Musculoskeletal: Negative.   Skin: Negative.        itching  Neurological: Negative.   Psychiatric/Behavioral: Negative.       Objective:   Physical Exam  Constitutional: She is oriented to person, place, and time. She appears well-developed and well-nourished.  HENT:  Head: Normocephalic and atraumatic.  Eyes: EOM  are normal.  Neck: Normal range of motion.  Cardiovascular: Normal rate.  irreg irreg, rate normal  Pulmonary/Chest: Effort normal and breath sounds normal. No respiratory distress. She has no wheezes. She has no rales.  Stable lung exam, clear  Abdominal: Soft. Bowel sounds are normal. She exhibits no distension. There is no tenderness. There is no rebound.  Musculoskeletal: She exhibits no edema.  Neurological: She is alert and oriented to person, place, and time. Coordination abnormal.  wheelchair  Skin: Skin is warm and dry.  Psychiatric: She has a normal mood and affect.   Vitals:   05/26/18 1357  BP: 100/60  Pulse: 71  Temp: (!) 97.5 F (36.4 C)  TempSrc: Oral  SpO2: 95%  Weight: 237 lb (107.5 kg)  Height: 6' (1.829 m)      Assessment & Plan:

## 2018-05-26 NOTE — Assessment & Plan Note (Signed)
Checking U/A and culture today to ensure clearance. Mental status is clear today on exam.

## 2018-05-26 NOTE — Telephone Encounter (Signed)
Called [name] [name] no answer LMOM w/MD response.Marland KitchenJohny [name]

## 2018-05-28 ENCOUNTER — Other Ambulatory Visit: Payer: Self-pay

## 2018-05-28 LAB — URINE CULTURE
MICRO NUMBER:: 91023240
SPECIMEN QUALITY:: ADEQUATE

## 2018-05-28 NOTE — Patient Outreach (Signed)
[facility]) Care Management  05/28/2018  [name] [name] Jul 03, 1931 [id number]   BSW contacted patient's daughter, [name] [name], regarding social work referral for "assistance with applying for Medicaid".  [name] reported that patient previously applied but was denied.  Due to financial changes, she believes that her mom may qualify at this time.  BSW offered to assist with the application process but [name] said that she would prefer to go to the Department of Social Services to apply in person and discuss options with a case worker.   BSW inquired about other needs.  [name] denied the need for transportation.  She said that she prefers to take her mother to all appointments so that she can stay abreast of her treatment.  She denied the need for any other resources.  BSW is closing case at this time due to no other social work needs being identified.   Ronn Melena, BSW Social Worker [phone]

## 2018-06-03 ENCOUNTER — Encounter: Payer: Self-pay | Admitting: Internal Medicine

## 2018-06-03 ENCOUNTER — Ambulatory Visit: Payer: Medicare HMO | Admitting: Internal Medicine

## 2018-06-03 VITALS — BP 110/72 | HR 94 | Ht 72.0 in | Wt 232.8 lb

## 2018-06-03 DIAGNOSIS — I4819 Other persistent atrial fibrillation: Secondary | ICD-10-CM

## 2018-06-03 DIAGNOSIS — I480 Paroxysmal atrial fibrillation: Secondary | ICD-10-CM

## 2018-06-03 DIAGNOSIS — I495 Sick sinus syndrome: Secondary | ICD-10-CM

## 2018-06-03 DIAGNOSIS — I10 Essential (primary) hypertension: Secondary | ICD-10-CM

## 2018-06-03 DIAGNOSIS — I481 Persistent atrial fibrillation: Secondary | ICD-10-CM

## 2018-06-03 DIAGNOSIS — Z950 Presence of cardiac pacemaker: Secondary | ICD-10-CM

## 2018-06-03 LAB — CUP PACEART INCLINIC DEVICE CHECK
Battery Impedance: 1503 Ohm
Battery Voltage: 2.76 V
Brady Statistic AP VP Percent: 1 %
Brady Statistic AP VS Percent: 45 %
Brady Statistic AS VP Percent: 1 %
Brady Statistic AS VS Percent: 53 %
Implantable Lead Implant Date: 20101001
Implantable Lead Location: 753860
Implantable Lead Model: 5076
Implantable Pulse Generator Implant Date: 20101001
Lead Channel Impedance Value: 405 Ohm
Lead Channel Pacing Threshold Amplitude: 0.5 V
Lead Channel Pacing Threshold Amplitude: 0.75 V
Lead Channel Pacing Threshold Pulse Width: 0.4 ms
Lead Channel Pacing Threshold Pulse Width: 0.4 ms
Lead Channel Pacing Threshold Pulse Width: 1 ms
Lead Channel Sensing Intrinsic Amplitude: 11.2 mV
Lead Channel Setting Pacing Amplitude: 2 V
Lead Channel Setting Pacing Amplitude: 2.5 V
Lead Channel Setting Pacing Pulse Width: 1 ms
Lead Channel Setting Sensing Sensitivity: 2.8 mV
MDC IDC LEAD IMPLANT DT: 20101001
MDC IDC LEAD LOCATION: 753859
MDC IDC MSMT BATTERY REMAINING LONGEVITY: 43 mo
MDC IDC MSMT LEADCHNL RA SENSING INTR AMPL: 0.25 mV
MDC IDC MSMT LEADCHNL RV IMPEDANCE VALUE: 385 Ohm
MDC IDC MSMT LEADCHNL RV PACING THRESHOLD AMPLITUDE: 1.125 V
MDC IDC SESS DTM: 20190904143510

## 2018-06-03 NOTE — Patient Instructions (Signed)
Medication Instructions:  Your physician recommends that you continue on your current medications as directed. Please refer to the Current Medication list given to you today.  Labwork: None ordered.  Testing/Procedures: None ordered.  Follow-Up: Your physician recommends that you schedule a follow-up appointment in:   8 weeks with Tommye Standard, PA  Remote monitoring is used to monitor your Pacemaker from home. This monitoring reduces the number of office visits required to check your device to one time per year. It allows [place] to keep an eye on the functioning of your device to ensure it is working properly. You are scheduled for a device check from home on 09/02/18. You may send your transmission at any time that day. If you have a wireless device, the transmission will be sent automatically. After your physician reviews your transmission, you will receive a postcard with your next transmission date.    Any Other Special Instructions Will Be Listed Below (If Applicable).     If you need a refill on your cardiac medications before your next appointment, please call your pharmacy.

## 2018-06-03 NOTE — Progress Notes (Signed)
PCP: Hoyt Koch, MD   Primary EP:  Dr Rayann Heman  [name] [name] is a 84 y.o. female who presents today for routine electrophysiology followup.  Since last being seen in our clinic, the patient continues to have cognitive decline.  She was recently hospitalized.  She is not very active and has some SOB with moderate activity.  She has been in persistent afib for quite some time.  She is tired during the day.  Today, she denies symptoms of palpitations, chest pain,  lower extremity edema, dizziness, presyncope, or syncope.  The patient is otherwise without complaint today.   Past Medical History:  Diagnosis Date  . Acute gastric ulcer without mention of hemorrhage, perforation, or obstruction 2006   EGD  . Anxiety   . Atrial fibrillation (HCC)    flecainide; coumadin  . Breast cancer ([place])    left  . Carotid artery disease ([place])    16/10: RICA 9-60%, LICA 45-40%  //  [place] 98/11: RICA 9-14%; LICA 78-29% >> FU 1 year   . Chronic diastolic heart failure (HCC)    a. echo 10/10: mild LVH, EF 55-60%, mild AI, mild MR, mild to mod LAE, mild RVE, severe RAE, mod TR, PASP 53  /  b.  Echo 3/14:  Mild LVH, EF 55%, Tr AI, MAC, mild MR, mod LAE, PASP 31, trivial eff. // c. echo 1/17: EF 55-60%, mild AI, MAC, mild MR, moderate BAE, moderate TR  . Chronic lower back pain   . Diabetic peripheral neuropathy ([place]) 03/09/2015  . Diverticulosis   . Esophagitis, unspecified 2006   EGD  . Hiatal hernia 2006   EGD  . History of nuclear stress test    a. Myoview 2/12: EF 69%, no scar or ischemia  . Hx of adenomatous colonic polyps   . Hypertension   . Mediastinal lymphadenopathy   . Memory difficulty 11/14/2016  . Mitral regurgitation    mild, echo, October, 2010  . Osteopenia   . Presence of permanent cardiac pacemaker   . Pulmonary embolus [facility]) 2006   2006, with DVT  . Pulmonary HTN ([place])    53 mmHg echo, 2010, moderate TR, mild right ventricular enlargement  . Tachy-brady  syndrome ([place])    s/p pacer 07/2009  . Thyroid nodule    non-neoplastic goiter  . Type II diabetes mellitus ([place])   . Venous insufficiency    Chronic   Past Surgical History:  Procedure Laterality Date  . ABDOMINAL HYSTERECTOMY    . ANKLE FRACTURE SURGERY Right   . BACK SURGERY    . BREAST LUMPECTOMY Left   . FRACTURE SURGERY    . INSERT / REPLACE / REMOVE PACEMAKER  06/30/2009   MDT Adalpta L implanted by Dr Olevia Perches  . LUMBAR LAMINECTOMY  1973, 06/22/2011   "no hardware either time"    ROS- all systems are reviewed and negative except as per HPI above  Current Outpatient Medications  Medication Sig Dispense Refill  . acetaminophen (TYLENOL) 325 MG tablet Take 650 mg by mouth every 6 (six) hours as needed for mild pain.    . bethanechol (URECHOLINE) 25 MG tablet Take 25 mg by mouth 2 (two) times daily.    . blood glucose meter kit and supplies KIT Dispense based on patient and insurance preference. Use before breakfast and at bedtime. Diagnosis code: E11.9 1 each 2  . Dextrose-Fructose-Sod Citrate (NAUZENE) [phone] MG CHEW Chew 1 tablet by mouth as needed (for nausea).    Marland Kitchen  furosemide (LASIX) 80 MG tablet TAKE 1 TABLET (80 MG TOTAL) BY MOUTH 2 (TWO) TIMES DAILY. 180 tablet 3  . memantine (NAMENDA) 5 MG tablet TAKE 1 TABLET BY MOUTH TWICE A DAY 180 tablet 1  . metoprolol succinate (TOPROL-XL) 25 MG 24 hr tablet Take 3 tablets (75 mg total) by mouth daily. 90 tablet 0  . potassium chloride 20 MEQ/15ML (10%) SOLN Take 20 mEq by mouth daily as needed (if have not eaten a banana).    . pregabalin (LYRICA) 100 MG capsule Take 1 capsule (100 mg total) by mouth 2 (two) times daily. 60 capsule 1  . promethazine (PHENERGAN) 25 MG tablet Take 1 tablet (25 mg total) by mouth every 8 (eight) hours as needed for nausea or vomiting. 20 tablet 0  . pyridoxine (B-6) 200 MG tablet Take 200 mg by mouth daily.    Marland Kitchen triamcinolone cream (KENALOG) 0.1 % Apply 1 application topically 2 (two) times  daily. 100 g 2  . vitamin B-12 (CYANOCOBALAMIN) 500 MCG tablet Take 500 mcg by mouth daily.     Marland Kitchen warfarin (COUMADIN) 2.5 MG tablet TAKE 1/2 TABLET TO 1 TABLET DAILY OR AS DIRECTED BY COUMADIN CLINIC (Patient taking differently: Take 1.25-2.5 mg by mouth See admin instructions. Take 2.5 mg by mouth in the morning on Sunday/Monday/Wednesday/Friday and 1.25 mg on Tuesday/Thursday/Saturday) 90 tablet 1   No current facility-administered medications for this visit.     Physical Exam: Vitals:   06/03/18 1228  BP: 110/72  Pulse: 94  SpO2: 92%  Weight: 232 lb 12.8 oz (105.6 kg)  Height: 6' (1.829 m)    GEN- The patient is well appearing, alert but confused Head- normocephalic, atraumatic Eyes-  Sclera clear, conjunctiva pink Ears- hearing intact Oropharynx- clear Lungs- Clear to ausculation bilaterally, normal work of breathing Chest- pacemaker pocket is well healed Heart- irregular rate and rhythm, no murmurs, rubs or gallops, PMI not laterally displaced GI- soft, NT, ND, + BS Extremities- no clubbing, cyanosis, or edema  Pacemaker interrogation- reviewed in detail today,  See PACEART report  ekg tracing ordered today is personally reviewed and shows afib, V rate 94 bpm  Assessment and Plan:  1. Symptomatic sinus bradycardia  Normal pacemaker function See Pace Art report No changes today  2. Persistent afib Rate controlled afib Given severe LA enlargement, I think rate control is the best plan going forward chads2vasc score is 5. Continue on coumadin  3. HTN Stable No change required today  4. SOB multifactoral Primarily due to deconditioning Does not appear volume overloaded today  Ultimately a conservative approach is best for her care.  I am not convinced that additional CV procedures would be beneficial at this time.  Carelink Return to see EP PA in 2 months  Thompson Grayer MD, [facility] 06/03/2018 12:35 PM

## 2018-06-04 ENCOUNTER — Ambulatory Visit: Payer: Medicare HMO | Admitting: Physician Assistant

## 2018-06-04 ENCOUNTER — Ambulatory Visit: Payer: Medicare HMO | Admitting: Adult Health

## 2018-06-04 ENCOUNTER — Ambulatory Visit: Payer: Self-pay | Admitting: *Deleted

## 2018-06-04 ENCOUNTER — Other Ambulatory Visit: Payer: Self-pay | Admitting: *Deleted

## 2018-06-04 DIAGNOSIS — I779 Disorder of arteries and arterioles, unspecified: Secondary | ICD-10-CM | POA: Diagnosis not present

## 2018-06-04 DIAGNOSIS — I480 Paroxysmal atrial fibrillation: Secondary | ICD-10-CM | POA: Diagnosis not present

## 2018-06-04 DIAGNOSIS — E1142 Type 2 diabetes mellitus with diabetic polyneuropathy: Secondary | ICD-10-CM | POA: Diagnosis not present

## 2018-06-04 DIAGNOSIS — L299 Pruritus, unspecified: Secondary | ICD-10-CM | POA: Diagnosis not present

## 2018-06-04 DIAGNOSIS — R69 Illness, unspecified: Secondary | ICD-10-CM | POA: Diagnosis not present

## 2018-06-04 DIAGNOSIS — N3000 Acute cystitis without hematuria: Secondary | ICD-10-CM | POA: Diagnosis not present

## 2018-06-04 DIAGNOSIS — I5032 Chronic diastolic (congestive) heart failure: Secondary | ICD-10-CM | POA: Diagnosis not present

## 2018-06-04 DIAGNOSIS — I110 Hypertensive heart disease with heart failure: Secondary | ICD-10-CM | POA: Diagnosis not present

## 2018-06-04 DIAGNOSIS — R339 Retention of urine, unspecified: Secondary | ICD-10-CM | POA: Diagnosis not present

## 2018-06-04 DIAGNOSIS — I700 Atherosclerosis of aorta: Secondary | ICD-10-CM | POA: Diagnosis not present

## 2018-06-04 MED ORDER — PREGABALIN 50 MG PO CAPS: 50.0000 mg | ORAL_CAPSULE | Freq: Two times a day (BID) | ORAL | 1 refills | Status: DC

## 2018-06-04 NOTE — Patient Outreach (Signed)
[facility] [facility]) Care Management  06/04/2018  [name] Sciortino 13-Aug-1931 [id number]   Notified member triggered red on EMMI heart failure dashboard regarding weight increase.  Call placed to daughter, state member was seen by cardiologist yesterday and no significant weight gain per their scale.  Member does continue to have intermittent shortness of breath, but that is not their major concern. They are now concern that she was given Lyrica instead of Gabapentin, itching stopped but now increased sleeping.  Has placed a call to primary MD office with concerns, awaiting call back.  Denies any urgent concerns, home visit remains scheduled for next week, advised to contact this care manager with questions.  Valente David, [place], MSN [place] [phone]

## 2018-06-04 NOTE — Telephone Encounter (Signed)
Will advise to cut dose in half. We have sent in new rx for lyrica 50 mg to take daily at night. If doing well can increase to 1 pill twice a day. Stop taking lyrica 100 mg.

## 2018-06-04 NOTE — Telephone Encounter (Signed)
Called daughter and informed of MD response and stated understanding

## 2018-06-04 NOTE — Addendum Note (Signed)
Addended by: Pricilla Holm A on: 06/04/2018 10:56 AM   Modules accepted: Orders

## 2018-06-04 NOTE — Telephone Encounter (Signed)
Call from [place], patient's daughter. Patient began taking Lyrica 100 MG BID on 05/26/18. Several days later, she began sleeping much more than her usual. She will fall asleep on the toilet and at the table with meals. She also "rambles" and talks as though she is having hallucinations.The lyrica was given to replace gabapentin to help with itching and her neuropathy. Daughter states, this (lyrica is not working). Itchy has resolved. Patient continues to have some SOB on exertion-she is not able to manage using the [place] inhaler herself so not using it.  Please advise.  Reason for Disposition . Caller has NON-URGENT medication question about med that PCP prescribed and triager unable to answer question  Answer Assessment - Initial Assessment Questions 1. SYMPTOMS: "Do you have any symptoms?"     Yes, sleepiness, shortness of breath on exertion that resolves when she rest. Talking about things that aren't happening-talking out of her head.  2. SEVERITY: If symptoms are present, ask "Are they mild, moderate or severe?" Patient's daughter feels symptoms are related to her taking Lyrica.  Protocols used: MEDICATION QUESTION CALL-A-AH

## 2018-06-04 NOTE — Addendum Note (Signed)
Addended by: Rose Phi on: 06/04/2018 03:45 PM   Modules accepted: Orders

## 2018-06-05 ENCOUNTER — Telehealth: Payer: Self-pay | Admitting: Internal Medicine

## 2018-06-05 NOTE — Telephone Encounter (Signed)
Copied from [place]. Topic: Inquiry >> Jun 04, 2018  9:49 AM Berneta Levins wrote: Reason for CRM:   Benjamine Mola from [employer] calling to check on forms filled out by Dr. Sharlet Salina for pt's daughter, [name] [name], to take care of mother.  They are needing a diagnosis code for the pt. Benjamine Mola can be reached at [phone], claim number [id number].   What CPT code would you like for this? On the FMLA form in the medical condition you stated " Patient has several health conditions." They want the CPT code for these conditions.

## 2018-06-08 ENCOUNTER — Other Ambulatory Visit: Payer: Self-pay | Admitting: *Deleted

## 2018-06-08 NOTE — Telephone Encounter (Signed)
Ruzie called to check status of [employer] getting the codes from pt's pcp  Also she is asking if the medication pregabalin (LYRICA) 50 MG capsule [[id number]]  Can stay on current dosage or does she need to increase dosage as suggested

## 2018-06-08 NOTE — Patient Outreach (Signed)
[place] [facility]) Care Management   06/08/2018  [name] [name] 01-28-1931 [id number]  [name] [name] is an 84 y.o. female  Subjective:   Member alert and oriented x3, denies pain or discomfort at this time.  Report she is improving, was able walk down the hall today and do some light housework without shortness of breath, but state she was tired afterwards.  Both she and daughter report compliance with medications.  Report she was seen by cardiologist and primary MD within the last 2 weeks.  Report Lyrica has been decreased, not sleeping as much, adjusted dose working well.  Objective:   Review of Systems  Constitutional: Negative.   HENT: Negative.   Eyes: Negative.   Respiratory: Negative.   Cardiovascular: Positive for leg swelling.  Gastrointestinal: Negative.   Genitourinary: Negative.   Musculoskeletal: Negative.   Skin: Negative.   Neurological: Negative.   Endo/Heme/Allergies: Negative.   Psychiatric/Behavioral: Negative.     Physical Exam  Constitutional: She is oriented to person, place, and time. She appears well-developed and well-nourished.  Neck: Normal range of motion.  Cardiovascular:  Atrial fibrillation  Respiratory: Effort normal and breath sounds normal.  GI: Soft. Bowel sounds are normal.  Musculoskeletal: Normal range of motion.  Neurological: She is alert and oriented to person, place, and time.  Skin: Skin is warm and dry.   BP 114/70   Pulse 80   Resp 18   Ht 1.829 m (6')   Wt 233 lb (105.7 kg)   SpO2 98%   BMI 31.60 kg/m   Encounter Medications:   Outpatient Encounter Medications as of 06/08/2018  Medication Sig Note  . acetaminophen (TYLENOL) 325 MG tablet Take 650 mg by mouth every 6 (six) hours as needed for mild pain.   . bethanechol (URECHOLINE) 25 MG tablet Take 25 mg by mouth 2 (two) times daily.   . blood glucose meter kit and supplies KIT Dispense based on patient and insurance preference. Use before breakfast and  at bedtime. Diagnosis code: E11.9   . Dextrose-Fructose-Sod Citrate (NAUZENE) [phone] MG CHEW Chew 1 tablet by mouth as needed (for nausea).   . furosemide (LASIX) 80 MG tablet TAKE 1 TABLET (80 MG TOTAL) BY MOUTH 2 (TWO) TIMES DAILY.   . memantine (NAMENDA) 5 MG tablet TAKE 1 TABLET BY MOUTH TWICE A DAY   . metoprolol succinate (TOPROL-XL) 25 MG 24 hr tablet Take 3 tablets (75 mg total) by mouth daily.   . potassium chloride 20 MEQ/15ML (10%) SOLN Take 20 mEq by mouth daily as needed (if have not eaten a banana). 05/11/2018: Not taken often  . pregabalin (LYRICA) 50 MG capsule Take 1 capsule (50 mg total) by mouth 2 (two) times daily.   Marland Kitchen pyridoxine (B-6) 200 MG tablet Take 200 mg by mouth daily.   Marland Kitchen triamcinolone cream (KENALOG) 0.1 % Apply 1 application topically 2 (two) times daily.   . vitamin B-12 (CYANOCOBALAMIN) 500 MCG tablet Take 500 mcg by mouth daily.    Marland Kitchen warfarin (COUMADIN) 2.5 MG tablet TAKE 1/2 TABLET TO 1 TABLET DAILY OR AS DIRECTED BY COUMADIN CLINIC (Patient taking differently: Take 1.25-2.5 mg by mouth See admin instructions. Take 2.5 mg by mouth in the morning on Sunday/Monday/Wednesday/Friday and 1.25 mg on Tuesday/Thursday/Saturday) 05/11/2018: Regimen confirmed to be accurate by the patient's daughter  . promethazine (PHENERGAN) 25 MG tablet Take 1 tablet (25 mg total) by mouth every 8 (eight) hours as needed for nausea or vomiting. (Patient not taking: Reported on 06/08/2018)  No facility-administered encounter medications on file as of 06/08/2018.     Functional Status:   In your present state of health, do you have any difficulty performing the following activities: 05/11/2018  Hearing? N  Vision? N  Difficulty concentrating or making decisions? N  Walking or climbing stairs? N  Dressing or bathing? Y  Doing errands, shopping? Y  Some recent data might be hidden    Fall/Depression Screening:    Fall Risk  01/21/2018 11/14/2016 10/20/2015  Falls in the past year?  No No Yes  Number falls in past yr: - - 1  Injury with Fall? - - No   PHQ 2/9 Scores 01/21/2018 10/20/2015  PHQ - 2 Score 0 0    Assessment:    Met with member at scheduled time, daughter present during visit.  She is main caregiver for member, has other friends/family that are able to assist when needed.  Member reportedly has some dementia, but able to function independently in her home with her daughter's help.  Daughter report she is currently on FMLA, but will soon return to work.  She will continue to check on member daily.    Discussed option of adult day services, they are skeptical due to transportation.  Daughter and her friend provide all transportation to appointments, hesitant to have member try other modes of transportation as they have experienced a situation in the past where member's sister (who also had dementia) disappeared and was missing for over a month, was found deceased.  Unsure if PACE will be a good option as they are concerned with price and loss of current primary MD.  Daughter has started process for Medicaid application in effort to have PCS started.  She has contact information for BSW, will contact her if she need assistance with process.   Member does weigh self daily, but is not consistent with wearing compression socks.  Daughter report she has some with zippers that are easier to put on, but she still does not wear them.  State she will start tomorrow.  Also not consistent with elevating legs/feet at rest, will attempt to improve on that as well.  Daughter manages diet as much as she can to accommodate low sodium foods, however state member's favorite food is hot dogs and will have other family member's bring her food at times.  Educated on low sodium diet and complications of not following diet plan, she verbalizes understanding.  Denies any urgent concerns at this time, provided with contact information.  Advised to contact with questions.  Plan:   Will  follow up within the next month.  [facility] CM Care Plan Problem One     Most Recent Value  Care Plan Problem One  Risks for hospitalization related to heart failure/a-fib as evidenced by recent hospitalization and ED visit  Role Documenting the Problem One  Care Management [place] for Problem One  Active  [facility] Long Term Goal   Member will not have any ED visits/hospitalizations within the next 31 days  [facility] Long Term Goal Start Date  05/25/18  Interventions for Problem One Long Term Goal  Provided member/daughter with "Know before you go" brochure regarding when to present to ED/hospital.  [facility] CM Short Term Goal #1   Member will have follow up appointment with cardiology and primary MD within the next 2 weeks  [facility] CM Short Term Goal #1 Start Date  05/25/18  [facility] CM Short Term Goal #1 Met Date  06/08/18  THN CM Short Term Goal #2   Member will weigh self and record reading daily over the next 4 weeks  THN CM Short Term Goal #2 Start Date  05/25/18  Interventions for Short Term Goal #2  Educated member and daughter on heart failure zones and signs/symptoms of heart failure exacerbation     Valente David, Therapist, sports, MSN [place] [phone]

## 2018-06-08 NOTE — Telephone Encounter (Signed)
[employer] and spoke with [facility] & give the diagnosis code that was messing.

## 2018-06-08 NOTE — Telephone Encounter (Signed)
Noted  

## 2018-06-08 NOTE — Telephone Encounter (Signed)
I50.33, N39.0,

## 2018-06-09 NOTE — Telephone Encounter (Signed)
Fine to change

## 2018-06-09 NOTE — Telephone Encounter (Signed)
Changes have been made and faxed to [place].  [name] has been informed.

## 2018-06-09 NOTE — Telephone Encounter (Signed)
Spoke with daughter, she has been informed that [place] does have the CPT codes.   Daughter is asking if she can go back to work on Monday 9/16 because her mother is doing well and home health has been coming in. Her return to work date was 10/1. She would then like to have her FMLA changed to intermitting 1 to 8 hours a day up to 4x a month if her mother ever did need her.   If you approve the changes can just be made to the other FMLA forms.

## 2018-06-09 NOTE — Telephone Encounter (Signed)
Pts daughter calling to check status on the FMLA paperwork being updated and being faxed to [place]. She states [employer] needs the forms by 06/18/18 or they will deny her FMLA. She is requesting a call with an update.

## 2018-06-11 DIAGNOSIS — N3000 Acute cystitis without hematuria: Secondary | ICD-10-CM | POA: Diagnosis not present

## 2018-06-11 DIAGNOSIS — I700 Atherosclerosis of aorta: Secondary | ICD-10-CM | POA: Diagnosis not present

## 2018-06-11 DIAGNOSIS — I779 Disorder of arteries and arterioles, unspecified: Secondary | ICD-10-CM | POA: Diagnosis not present

## 2018-06-11 DIAGNOSIS — E1142 Type 2 diabetes mellitus with diabetic polyneuropathy: Secondary | ICD-10-CM | POA: Diagnosis not present

## 2018-06-11 DIAGNOSIS — L299 Pruritus, unspecified: Secondary | ICD-10-CM | POA: Diagnosis not present

## 2018-06-11 DIAGNOSIS — R339 Retention of urine, unspecified: Secondary | ICD-10-CM | POA: Diagnosis not present

## 2018-06-11 DIAGNOSIS — I480 Paroxysmal atrial fibrillation: Secondary | ICD-10-CM | POA: Diagnosis not present

## 2018-06-11 DIAGNOSIS — R69 Illness, unspecified: Secondary | ICD-10-CM | POA: Diagnosis not present

## 2018-06-11 DIAGNOSIS — I5032 Chronic diastolic (congestive) heart failure: Secondary | ICD-10-CM | POA: Diagnosis not present

## 2018-06-11 DIAGNOSIS — I110 Hypertensive heart disease with heart failure: Secondary | ICD-10-CM | POA: Diagnosis not present

## 2018-06-16 ENCOUNTER — Encounter: Payer: Self-pay | Admitting: *Deleted

## 2018-06-16 DIAGNOSIS — Z6835 Body mass index (BMI) 35.0-35.9, adult: Secondary | ICD-10-CM | POA: Diagnosis not present

## 2018-06-16 DIAGNOSIS — N184 Chronic kidney disease, stage 4 (severe): Secondary | ICD-10-CM | POA: Diagnosis not present

## 2018-06-16 DIAGNOSIS — Z23 Encounter for immunization: Secondary | ICD-10-CM | POA: Diagnosis not present

## 2018-06-16 DIAGNOSIS — I129 Hypertensive chronic kidney disease with stage 1 through stage 4 chronic kidney disease, or unspecified chronic kidney disease: Secondary | ICD-10-CM | POA: Diagnosis not present

## 2018-06-18 ENCOUNTER — Telehealth: Payer: Self-pay | Admitting: *Deleted

## 2018-06-18 ENCOUNTER — Encounter: Payer: Self-pay | Admitting: Internal Medicine

## 2018-06-18 ENCOUNTER — Ambulatory Visit (INDEPENDENT_AMBULATORY_CARE_PROVIDER_SITE_OTHER): Payer: Medicare HMO | Admitting: Internal Medicine

## 2018-06-18 ENCOUNTER — Other Ambulatory Visit (INDEPENDENT_AMBULATORY_CARE_PROVIDER_SITE_OTHER): Payer: Medicare HMO

## 2018-06-18 ENCOUNTER — Ambulatory Visit: Payer: Self-pay | Admitting: Internal Medicine

## 2018-06-18 VITALS — BP 110/80 | HR 73 | Temp 97.5°F | Ht 72.0 in | Wt 230.0 lb

## 2018-06-18 DIAGNOSIS — I5032 Chronic diastolic (congestive) heart failure: Secondary | ICD-10-CM

## 2018-06-18 DIAGNOSIS — I700 Atherosclerosis of aorta: Secondary | ICD-10-CM | POA: Diagnosis not present

## 2018-06-18 DIAGNOSIS — E1142 Type 2 diabetes mellitus with diabetic polyneuropathy: Secondary | ICD-10-CM | POA: Diagnosis not present

## 2018-06-18 DIAGNOSIS — I110 Hypertensive heart disease with heart failure: Secondary | ICD-10-CM | POA: Diagnosis not present

## 2018-06-18 DIAGNOSIS — I481 Persistent atrial fibrillation: Secondary | ICD-10-CM

## 2018-06-18 DIAGNOSIS — I480 Paroxysmal atrial fibrillation: Secondary | ICD-10-CM | POA: Diagnosis not present

## 2018-06-18 DIAGNOSIS — N3000 Acute cystitis without hematuria: Secondary | ICD-10-CM | POA: Diagnosis not present

## 2018-06-18 DIAGNOSIS — R69 Illness, unspecified: Secondary | ICD-10-CM | POA: Diagnosis not present

## 2018-06-18 DIAGNOSIS — R0609 Other forms of dyspnea: Secondary | ICD-10-CM | POA: Diagnosis not present

## 2018-06-18 DIAGNOSIS — R339 Retention of urine, unspecified: Secondary | ICD-10-CM | POA: Diagnosis not present

## 2018-06-18 DIAGNOSIS — I779 Disorder of arteries and arterioles, unspecified: Secondary | ICD-10-CM | POA: Diagnosis not present

## 2018-06-18 DIAGNOSIS — R4189 Other symptoms and signs involving cognitive functions and awareness: Secondary | ICD-10-CM

## 2018-06-18 DIAGNOSIS — I4819 Other persistent atrial fibrillation: Secondary | ICD-10-CM

## 2018-06-18 DIAGNOSIS — L299 Pruritus, unspecified: Secondary | ICD-10-CM | POA: Diagnosis not present

## 2018-06-18 LAB — PROTIME-INR
INR: 4.5 ratio — ABNORMAL HIGH (ref 0.8–1.0)
Prothrombin Time: 51.1 s — ABNORMAL HIGH (ref 9.6–13.1)

## 2018-06-18 NOTE — Progress Notes (Signed)
   Subjective:    Patient ID: [name] [name], female    DOB: [birth year], 84 y.o.   MRN: [id number]  HPI The patient is an 84 YO female coming in for visit today due to home health worker calling for visit. She was okay this morning when her daughter checked on her. She then was feeling nauseated and did not eat well during the day. She started feeling weak after this and some SOB. The home health worker did not know how her legs normally look and thought that they looked swollen. Denies fevers or chills. Denies vomiting. Denies diarrhea or constipation. Denies chest pains or SOB. The home health worker checked her sugar at the time and it was 180 about. She is feeling improved now and still mild nausea but overall well. Denies SOB currently. Was becoming more active except today when feeling poorly.   Review of Systems  Constitutional: Negative.   HENT: Negative.   Eyes: Negative.   Respiratory: Negative for cough, chest tightness and shortness of breath.   Cardiovascular: Negative for chest pain, palpitations and leg swelling.  Gastrointestinal: Positive for nausea. Negative for abdominal distention, abdominal pain, constipation, diarrhea and vomiting.  Musculoskeletal: Negative.   Skin: Negative.   Neurological: Negative.   Psychiatric/Behavioral: Negative.       Objective:   Physical Exam  Constitutional: She is oriented to person, place, and time. She appears well-developed and well-nourished.  HENT:  Head: Normocephalic and atraumatic.  Eyes: EOM are normal.  Neck: Normal range of motion.  Cardiovascular: Normal rate.  irreg irreg  Pulmonary/Chest: Effort normal and breath sounds normal. No respiratory distress. She has no wheezes. She has no rales.  Abdominal: Soft. Bowel sounds are normal. She exhibits no distension. There is no tenderness. There is no rebound.  Musculoskeletal: She exhibits edema.  Mild edema but improved overall from prior  Neurological: She is alert and  oriented to person, place, and time. Coordination normal.  Skin: Skin is warm and dry.  Psychiatric: She has a normal mood and affect.   Vitals:   06/18/18 1609  BP: 110/80  Pulse: 73  Temp: (!) 97.5 F (36.4 C)  TempSrc: Oral  SpO2: 96%  Weight: 230 lb (104.3 kg)  Height: 6' (1.829 m)      Assessment & Plan:  Visit time 25 minutes: greater than 50% of that time was spent in face to face counseling and coordination of care with the patient: counseled about overall well being and unpacking the concerns of the home health worker as well as how the patient is feeling currently. Also addressing concerns from the patient's daughter as well

## 2018-06-18 NOTE — Assessment & Plan Note (Signed)
She is forgetting to eat sometimes and is not always aware of her medical problems. She is working with home health to work on stability of her medical problems and overall health and care.

## 2018-06-18 NOTE — Assessment & Plan Note (Signed)
Appears to be well compensated today without acute flare. We discussed indications for change in meds. She is down about 2-3 pounds today which is stable.

## 2018-06-18 NOTE — Telephone Encounter (Signed)
Daughter called and stated that pt saw her PCP today and the pt's INR was taken and that they would fax it to [place]. At this time No INR resulted as it was just drawn at 1639; will await results and then call dtr/pt back with results. Advised dtr/pt that if we haven't called by noon than we don't have INR and will await the results.

## 2018-06-18 NOTE — Telephone Encounter (Signed)
noted 

## 2018-06-18 NOTE — Telephone Encounter (Signed)
[place]  O2 sat 97 Lungs clear  CBG 183  Patient is complaining of SOB and chills. No temperature - 97.5, BP 110/70 P 64   [facility] is monitoring the patient-patient states she was SOB this morning and cold. Change reported- measurement 26 cm in both legs with L area of skin breakdown with discoloration. Pitting+3  Reason for Disposition . [1] MILD difficulty breathing (e.g., minimal/no SOB at rest, SOB with walking, pulse <100) AND [2] NEW-onset or WORSE than normal  Answer Assessment - Initial Assessment Questions 1. RESPIRATORY STATUS: "Describe your breathing?" (e.g., wheezing, shortness of breath, unable to speak, severe coughing)      Patient reports she didn't have problems today- this morning she felt short of breath when she was cold. She turned the thermostat up patient is warmer and her breathing is better. 2. ONSET: "When did this breathing problem begin?"      This morning 3. PATTERN "Does the difficult breathing come and go, or has it been constant since it started?"      Breathing improved when she warmed up 4. SEVERITY: "How bad is your breathing?" (e.g., mild, moderate, severe)    - MILD: No SOB at rest, mild SOB with walking, speaks normally in sentences, can lay down, no retractions, pulse < 100.    - MODERATE: SOB at rest, SOB with minimal exertion and prefers to sit, cannot lie down flat, speaks in phrases, mild retractions, audible wheezing, pulse 100-120.    - SEVERE: Very SOB at rest, speaks in single words, struggling to breathe, sitting hunched forward, retractions, pulse > 120      mild 5. RECURRENT SYMPTOM: "Have you had difficulty breathing before?" If so, ask: "When was the last time?" and "What happened that time?"      Yes- patient rested and she felt better 6. CARDIAC HISTORY: "Do you have any history of heart disease?" (e.g., heart attack, angina, bypass surgery, angioplasty)      no 7. LUNG HISTORY: "Do you have any history of  lung disease?"  (e.g., pulmonary embolus, asthma, emphysema)     no 8. CAUSE: "What do you think is causing the breathing problem?"      No idea 9. OTHER SYMPTOMS: "Do you have any other symptoms? (e.g., dizziness, runny nose, cough, chest pain, fever)     no 10. PREGNANCY: "Is there any chance you are pregnant?" "When was your last menstrual period?"       n/a 11. TRAVEL: "Have you traveled out of the country in the last month?" (e.g., travel history, exposures)       n/a  Protocols used: BREATHING DIFFICULTY-A-AH

## 2018-06-18 NOTE — Assessment & Plan Note (Signed)
She was not able to use anoro and is overall doing better when working with PT at home to help with endurance overall.

## 2018-06-19 ENCOUNTER — Ambulatory Visit (INDEPENDENT_AMBULATORY_CARE_PROVIDER_SITE_OTHER): Payer: Medicare HMO | Admitting: General Practice

## 2018-06-19 DIAGNOSIS — Z5181 Encounter for therapeutic drug level monitoring: Secondary | ICD-10-CM | POA: Diagnosis not present

## 2018-06-19 DIAGNOSIS — Z86711 Personal history of pulmonary embolism: Secondary | ICD-10-CM | POA: Diagnosis not present

## 2018-06-19 NOTE — Telephone Encounter (Signed)
Results reviewed by Su Grand at PCP and me; Please refer to Anticoagulation Encounter for this date.

## 2018-06-19 NOTE — Patient Instructions (Addendum)
Pre visit review using our clinic review tool, if applicable. No additional management support is needed unless otherwise documented below in the visit note.   Hold coumadin today and tomorrow.  On Monday resume current dosage of  coumadin 1 tablet (2.5mg ) daily except 1/2 tablet Tuesdays, Thursdays, and Saturdays.  Spoke with patient's daughter, [name] and gave dosing instructions.  Keep dark greens consistent at 4 times a week on Tuesday, Thursday, Saturday, and Sunday.  Recheck INR in 2 weeks. Call if needed any new medications or if scheduled for any procedures  any concerns  [phone] - Asked daughter to contact PCP and coumadin clinic if patient is not feeling better by Monday. Description   Spoke with [name], pt's dtr and instructed to have pt hold today and tomorrow's dose of Coumadin  then continue taking normal dose of Coumadin 1 tablet (2.5mg ) daily except 1/2 tablet (1.25mg ) Tuesdays, Thursdays, and Saturdays.  Keep dark greens consistent at 4 times a week on Tuesday, Thursday, Saturday, and Sunday.  Recheck INR in 1 week. Call if needed any new medications or if scheduled for any procedures  any concerns  [phone]    Last instructions given to Dtr Ruzie and she was instructed to call with any issues-H[place]ill, Lauralyn Primes, RN .

## 2018-06-22 DIAGNOSIS — L851 Acquired keratosis [keratoderma] palmaris et plantaris: Secondary | ICD-10-CM | POA: Diagnosis not present

## 2018-06-22 DIAGNOSIS — E1142 Type 2 diabetes mellitus with diabetic polyneuropathy: Secondary | ICD-10-CM | POA: Diagnosis not present

## 2018-06-22 DIAGNOSIS — B351 Tinea unguium: Secondary | ICD-10-CM | POA: Diagnosis not present

## 2018-06-23 ENCOUNTER — Other Ambulatory Visit: Payer: Self-pay | Admitting: *Deleted

## 2018-06-23 ENCOUNTER — Other Ambulatory Visit: Payer: Self-pay | Admitting: Internal Medicine

## 2018-06-23 NOTE — Patient Outreach (Signed)
[place] [facility]) Care Management  06/23/2018  [name] [name] [birth year] [id number]   Notified that member triggered red with EMMO heart failure dashboard for worsening problems/concerns.  Call placed to member, she state she was feeling bad over the past couple days, increased shortness of breath, but is much better today.  Her weight has been stable, 223 pounds yesterday.  He daughter has gone back to work, she has been able to manage her care independently.  Denies any urgent concern at this time, will follow up within the next month.  THN CM Care Plan Problem One     Most Recent Value  Care Plan Problem One  Risks for hospitalization related to heart failure/a-fib as evidenced by recent hospitalization and ED visit  Role Documenting the Problem One  Care Management [place] for Problem One  Active  [facility] Long Term Goal   Member will not have any ED visits/hospitalizations within the next 31 days  THN Long Term Goal Start Date  05/25/18  [facility] Long Term Goal Met Date  06/23/18  [facility] CM Short Term Goal #2   Member will weigh self and record reading daily over the next 4 weeks  THN CM Short Term Goal #2 Start Date  05/25/18  [facility] CM Short Term Goal #2 Met Date  06/23/18     Valente David, RN, MSN [place] [phone]

## 2018-06-26 ENCOUNTER — Ambulatory Visit (INDEPENDENT_AMBULATORY_CARE_PROVIDER_SITE_OTHER): Payer: Medicare HMO | Admitting: *Deleted

## 2018-06-26 DIAGNOSIS — Z86711 Personal history of pulmonary embolism: Secondary | ICD-10-CM | POA: Diagnosis not present

## 2018-06-26 DIAGNOSIS — Z5181 Encounter for therapeutic drug level monitoring: Secondary | ICD-10-CM

## 2018-06-26 LAB — POCT INR: INR: 2.2 (ref 2.0–3.0)

## 2018-06-26 NOTE — Patient Instructions (Signed)
Description   Continue taking normal dose of Coumadin 1 tablet (2.5mg ) daily except 1/2 tablet (1.25mg ) Tuesdays, Thursdays, and Saturdays.  Keep dark greens consistent at 4 times a week on Tuesday, Thursday, Saturday, and Sunday.  Recheck INR in 4 weeks.  Call if needed any new medications or if scheduled for any procedures  any concerns  [phone]

## 2018-06-30 ENCOUNTER — Other Ambulatory Visit: Payer: Self-pay | Admitting: Cardiology

## 2018-07-10 ENCOUNTER — Ambulatory Visit: Payer: Medicare HMO | Admitting: Internal Medicine

## 2018-07-15 ENCOUNTER — Other Ambulatory Visit: Payer: Self-pay | Admitting: Internal Medicine

## 2018-07-17 ENCOUNTER — Other Ambulatory Visit: Payer: Self-pay | Admitting: *Deleted

## 2018-07-17 NOTE — Patient Outreach (Signed)
[place] [facility]) Care Management  07/17/2018  [name] [name] 10-Jul-1931 [id number]   Call placed to member to follow up on current health status and to schedule home visit.  She report she is doing well, itching has returned but denies shortness breath.  Will have her daughter contact MD again regarding itching.  Denies any recent weight gain or any urgent concerns.  Agrees to home visit within the next 2 weeks.  Advised to contact this care manager with questions.  Valente David, [place], MSN [place] [phone]

## 2018-07-23 ENCOUNTER — Ambulatory Visit: Payer: Medicare HMO | Admitting: Neurology

## 2018-07-23 ENCOUNTER — Encounter: Payer: Self-pay | Admitting: Neurology

## 2018-07-23 ENCOUNTER — Other Ambulatory Visit: Payer: Self-pay

## 2018-07-23 VITALS — BP 118/90 | HR 86 | Resp 18 | Ht 72.0 in | Wt 224.0 lb

## 2018-07-23 DIAGNOSIS — R4189 Other symptoms and signs involving cognitive functions and awareness: Secondary | ICD-10-CM

## 2018-07-23 DIAGNOSIS — E1142 Type 2 diabetes mellitus with diabetic polyneuropathy: Secondary | ICD-10-CM

## 2018-07-23 MED ORDER — MEMANTINE HCL 5 MG PO TABS: 5.0000 mg | ORAL_TABLET | Freq: Two times a day (BID) | ORAL | 3 refills | Status: AC

## 2018-07-23 NOTE — Progress Notes (Addendum)
Reason for visit: Memory disturbance, diabetic peripheral neuropathy  [name] [name] is an 84 y.o. female  History of present illness:  [name] [name] is an 84 year old right-handed Native American female with a history of diabetes with a diabetic peripheral neuropathy, and a gait disturbance.  [name] [name] currently is on Lyrica 50 mg at night which seemed to help initially but she is now having increased symptoms in [name] evening hours, she is not sleeping well.  [name] [name] is having itching problems throughout, she does have stage IV chronic renal insufficiency.  [name] [name] could not tolerate Benadryl or Vistaril previously.  [name] [name] has not had any falls, she lives alone but her family checks on her frequently.  She is managing her own medications with [name] pill dispenser, her family help to follow up with [name] dispenser.  [name] [name] cooks for herself.  She does not operate a motor vehicle.  [name] [name] returns to this office for an evaluation.   Past Medical History:  Diagnosis Date  . Acute gastric ulcer without mention of hemorrhage, perforation, or obstruction 2006   EGD  . Anxiety   . Atrial fibrillation (HCC)    flecainide; coumadin  . Breast cancer ([place])    left  . Carotid artery disease ([place])    54/62: RICA 7-03%, LICA 50-09%  //  [place] 38/18: RICA 2-99%; LICA 37-16% >> FU 1 year   . Chronic diastolic heart failure (HCC)    a. echo 10/10: mild LVH, EF 55-60%, mild AI, mild MR, mild to mod LAE, mild RVE, severe RAE, mod TR, PASP 53  /  b.  Echo 3/14:  Mild LVH, EF 55%, Tr AI, MAC, mild MR, mod LAE, PASP 31, trivial eff. // c. echo 1/17: EF 55-60%, mild AI, MAC, mild MR, moderate BAE, moderate TR  . Chronic lower back pain   . Diabetic peripheral neuropathy ([place]) 03/09/2015  . Diverticulosis   . Esophagitis, unspecified 2006   EGD  . Hiatal hernia 2006   EGD  . History of nuclear stress test    a. Myoview 2/12: EF 69%, no scar or ischemia  . Hx of adenomatous  colonic polyps   . Hypertension   . Mediastinal lymphadenopathy   . Memory difficulty 11/14/2016  . Mitral regurgitation    mild, echo, October, 2010  . Osteopenia   . Presence of permanent cardiac pacemaker   . Pulmonary embolus [facility]) 2006   2006, with DVT  . Pulmonary HTN ([place])    53 mmHg echo, 2010, moderate TR, mild right ventricular enlargement  . Tachy-brady syndrome ([place])    s/p pacer 07/2009  . Thyroid nodule    non-neoplastic goiter  . Type II diabetes mellitus ([place])   . Venous insufficiency    Chronic    Past Surgical History:  Procedure Laterality Date  . ABDOMINAL HYSTERECTOMY    . ANKLE FRACTURE SURGERY Right   . BACK SURGERY    . BREAST LUMPECTOMY Left   . FRACTURE SURGERY    . INSERT / REPLACE / REMOVE PACEMAKER  06/30/2009   MDT Adalpta L implanted by Dr Olevia Perches  . LUMBAR LAMINECTOMY  1973, 06/22/2011   "no hardware either time"    Family History  Problem Relation Age of Onset  . Heart disease Mother   . Hypertension Mother   . COPD Father   . Hypertension Father   . Hypertension Sister   . Hypertension Brother   . Hypertension Daughter   .  Colon cancer Neg Hx   . Heart attack Neg Hx   . Stroke Neg Hx     Social history:  reports that she quit smoking about 19 years ago. Her smoking use included cigarettes. She has a 47.00 pack-year smoking history. She has never used smokeless tobacco. She reports that she does not drink alcohol or use drugs.    Allergies  Allergen Reactions  . Amiodarone Shortness Of Breath and Nausea Only    "Toxicity," also  . Flecainide Shortness Of Breath and Other (See Comments)    "Toxicity," also   . Morphine And Related Nausea And Vomiting and Other (See Comments)    Family requests no morphine due to past reaction. Pt stated it made her sick, severe nausea and vomiting  . Bactrim Ds [Sulfamethoxazole-Trimethoprim] Diarrhea  . Cephalexin Nausea And Vomiting  . Pineapple Itching    Severe itching    Medications:    Prior to Admission medications   Medication Sig Start Date End Date Taking? Authorizing Provider  acetaminophen (TYLENOL) 325 MG tablet Take 650 mg by mouth every 6 (six) hours as needed for mild pain.   Yes [provider]  bethanechol (URECHOLINE) 25 MG tablet Take 25 mg by mouth 2 (two) times daily.   Yes [provider]  blood glucose meter kit and supplies KIT Dispense based on [name] and insurance preference. Use before breakfast and at bedtime. Diagnosis code: E11.9 03/26/17  Yes Nche, Charlene Brooke, NP  Dextrose-Fructose-Sod Citrate Almon [place]) [phone] MG CHEW Chew 1 tablet by mouth as needed (for nausea).   Yes [provider]  furosemide (LASIX) 80 MG tablet TAKE 1 TABLET (80 MG TOTAL) BY MOUTH 2 (TWO) TIMES DAILY. 01/13/18  Yes Isaiah Serge, NP  memantine (NAMENDA) 5 MG tablet Take 1 tablet (5 mg total) by mouth 2 (two) times daily. 07/23/18  Yes Kathrynn Ducking, MD  metoprolol succinate (TOPROL-XL) 25 MG 24 hr tablet TAKE 3 TABLETS BY MOUTH DAILY 06/23/18  Yes Hoyt Koch, MD  potassium chloride 20 MEQ/15ML (10%) SOLN Take 20 mEq by mouth daily as needed (if have not eaten a banana).   Yes [provider]  promethazine (PHENERGAN) 25 MG tablet Take 1 tablet (25 mg total) by mouth every 8 (eight) hours as needed for nausea or vomiting. 10/22/16  Yes Hoyt Koch, MD  pyridoxine (B-6) 200 MG tablet Take 200 mg by mouth daily.   Yes [provider]  triamcinolone cream (KENALOG) 0.1 % Apply 1 application topically 2 (two) times daily. 05/08/18  Yes Hoyt Koch, MD  vitamin B-12 (CYANOCOBALAMIN) 500 MCG tablet Take 500 mcg by mouth daily.    Yes [provider]  warfarin (COUMADIN) 2.5 MG tablet TAKE 1/2 TABLET TO 1 TABLET DAILY OR AS DIRECTED BY COUMADIN CLINIC 06/30/18  Yes Dorothy Spark, MD  pregabalin (LYRICA) 50 MG capsule Take 1 capsule (50 mg total) by mouth 2 (two) times daily. 06/04/18    Hoyt Koch, MD    ROS:  Out of a complete 14 system review of symptoms, [name] [name] complains only of [name] following symptoms, and all other reviewed systems are negative.  Decreased activity, decreased appetite, chills, fatigue Leg swelling Cold intolerance Nausea Incontinence of [name] bladder Joint pain, back pain, aching muscles, muscle cramps Itching Memory loss, numbness, weakness Confusion, depression, anxiety  Blood pressure 118/90, pulse 86, resp. rate 18, height 6' (1.829 m), weight 224 lb (101.6 kg).  Physical Exam  General:  [name] [name] is alert and cooperative at [name] time of [name] examination.  Skin: No significant peripheral edema is noted.   Neurologic Exam  Mental status: [name] [name] is alert and oriented x 3 at [name] time of [name] examination. [name] Mini-Mental status examination done today shows a total score 21/30.  [name] [name] is able to name 7 four-legged animals in 1 minute.   Cranial nerves: Facial symmetry is present. Speech is normal, no aphasia or dysarthria is noted. Extraocular movements are full. Visual fields are full.  Motor: [name] [name] has good strength in all 4 extremities.  Sensory examination: Soft touch sensation is symmetric on [name] face, arms, and legs.  Coordination: [name] [name] has good finger-nose-finger and heel-to-shin bilaterally.  Gait and station: [name] [name] has a wide-based, unsteady gait.  Tandem gait was not attempted.  Romberg is negative, but is unsteady. No drift is seen.  Reflexes: Deep tendon reflexes are symmetric, but are depressed.   Assessment/Plan:  1.  Memory disturbance  2.  Diabetic peripheral neuropathy  3.  Gait disturbance  [name] [name] is having increased discomfort in [name] feet and [name] evening hours and itching throughout, she will go up on [name] Lyrica taking 100 mg at night.  [name] [name] will need to watch out for increasing gait instability.  [name] [name] is doing well on her current dose of Namenda at  5 mg twice daily, a prescription was sent in.  [name] [name] will follow-up in 6 months.  Jill Alexanders MD 07/23/2018 4:24 PM  [facility] [address] [place], [place] [address]  Phone [phone] Fax [phone]

## 2018-07-24 ENCOUNTER — Ambulatory Visit (INDEPENDENT_AMBULATORY_CARE_PROVIDER_SITE_OTHER): Payer: Medicare HMO | Admitting: *Deleted

## 2018-07-24 DIAGNOSIS — Z5181 Encounter for therapeutic drug level monitoring: Secondary | ICD-10-CM

## 2018-07-24 DIAGNOSIS — Z86711 Personal history of pulmonary embolism: Secondary | ICD-10-CM

## 2018-07-24 LAB — POCT INR: INR: 4.3 — AB (ref 2.0–3.0)

## 2018-07-24 NOTE — Patient Instructions (Signed)
Description   Do not take take any coumadin tomorrow and take 1/2 tablet Sunday then continue taking normal dose of Coumadin 1 tablet (2.5mg ) daily except 1/2 tablet (1.25mg ) Tuesdays, Thursdays, and Saturdays.  Keep dark greens consistent at 4 times a week on Tuesday, Thursday, Saturday, and Sunday.  Recheck INR in 2 weeks.  Call if needed any new medications or if scheduled for any procedures  any concerns  [phone]

## 2018-07-27 ENCOUNTER — Other Ambulatory Visit: Payer: Self-pay | Admitting: *Deleted

## 2018-07-27 ENCOUNTER — Encounter: Payer: Self-pay | Admitting: Physician Assistant

## 2018-07-27 NOTE — Patient Outreach (Signed)
[place] [facility]) Care Management   07/27/2018  [name] Sprick 1931/07/28 [id number]  Gearline Spilman is an 84 y.o. female  Subjective:   Member alert and oriented x3, but very forgetful repeating self multiple times during conversation.  Denies complaints of pain, report itching is better (per note, Lyrica increased).  Member has scale, but does not weigh daily because she forgets.  Her daughter provides as much support as she can, manages medications.  Objective:   Review of Systems  Constitutional: Negative.   HENT: Negative.   Eyes: Negative.   Respiratory: Positive for shortness of breath.        Intermittent  Cardiovascular: Positive for leg swelling.  Gastrointestinal: Negative.   Genitourinary: Negative.   Musculoskeletal: Negative.   Skin: Negative.   Endo/Heme/Allergies: Negative.   Psychiatric/Behavioral: Negative.     Physical Exam  Constitutional: She is oriented to person, place, and time. She appears well-developed and well-nourished.  Neck: Normal range of motion.  Cardiovascular: Normal rate.  Respiratory: Effort normal.  GI: Soft. Bowel sounds are normal.  Musculoskeletal: Normal range of motion.  Neurological: She is alert and oriented to person, place, and time.  Skin: Skin is warm and dry.   BP 114/62 (BP Location: Right Arm, Patient Position: Sitting, Cuff Size: Normal)   Pulse 74   Resp 18   SpO2 98%   Encounter Medications:   Outpatient Encounter Medications as of 07/27/2018  Medication Sig Note  . acetaminophen (TYLENOL) 325 MG tablet Take 650 mg by mouth every 6 (six) hours as needed for mild pain.   . bethanechol (URECHOLINE) 25 MG tablet Take 25 mg by mouth 2 (two) times daily.   . blood glucose meter kit and supplies KIT Dispense based on patient and insurance preference. Use before breakfast and at bedtime. Diagnosis code: E11.9   . Dextrose-Fructose-Sod Citrate (NAUZENE) [phone] MG CHEW Chew 1 tablet by mouth as  needed (for nausea).   . furosemide (LASIX) 80 MG tablet TAKE 1 TABLET (80 MG TOTAL) BY MOUTH 2 (TWO) TIMES DAILY.   . memantine (NAMENDA) 5 MG tablet Take 1 tablet (5 mg total) by mouth 2 (two) times daily.   . metoprolol succinate (TOPROL-XL) 25 MG 24 hr tablet TAKE 3 TABLETS BY MOUTH DAILY   . potassium chloride 20 MEQ/15ML (10%) SOLN Take 20 mEq by mouth daily as needed (if have not eaten a banana). 05/11/2018: Not taken often  . pregabalin (LYRICA) 50 MG capsule Take 1 capsule (50 mg total) by mouth 2 (two) times daily. 07/23/2018: Pt. reports taking Lyrica once daily/fim  . promethazine (PHENERGAN) 25 MG tablet Take 1 tablet (25 mg total) by mouth every 8 (eight) hours as needed for nausea or vomiting.   . pyridoxine (B-6) 200 MG tablet Take 200 mg by mouth daily.   Marland Kitchen triamcinolone cream (KENALOG) 0.1 % Apply 1 application topically 2 (two) times daily.   . vitamin B-12 (CYANOCOBALAMIN) 500 MCG tablet Take 500 mcg by mouth daily.    Marland Kitchen warfarin (COUMADIN) 2.5 MG tablet TAKE 1/2 TABLET TO 1 TABLET DAILY OR AS DIRECTED BY COUMADIN CLINIC    No facility-administered encounter medications on file as of 07/27/2018.     Functional Status:   In your present state of health, do you have any difficulty performing the following activities: 06/08/2018 05/11/2018  Hearing? N N  Vision? Y N  Difficulty concentrating or making decisions? Y N  Walking or climbing stairs? Y N  Dressing or bathing? Aggie Moats  Doing errands, shopping? Tempie Donning  Preparing Food and eating ? Y -  Using the Toilet? N -  In the past six months, have you accidently leaked urine? Y -  Do you have problems with loss of bowel control? N -  Managing your Medications? Y -  Managing your Finances? Y -  Housekeeping or managing your Housekeeping? Y -  Some recent data might be hidden    Fall/Depression Screening:    Fall Risk  06/08/2018 01/21/2018 11/14/2016  Falls in the past year? No No No  Number falls in past yr: - - -  Injury with  Fall? - - -   PHQ 2/9 Scores 06/08/2018 01/21/2018 10/20/2015  PHQ - 2 Score 0 0 0    Assessment:    Met with member at scheduled time.  She still has some shortness of breath with activity, swelling in legs.  She has been advised by this care manager in consistently by her daughter to wear her compression socks as well as elevate legs while sitting, however she does not.  Unsure if she is noncompliant or forget to do so.  Weight complete today, no weight gain from previous reading.  State she will try to weigh daily.    This care manager has concern about member living alone, discussed with daughter during previous visit.  They are not interested in placement, nor are they interested in PACE.  They have had a bad experience in the past with a family member and prefer to manage member's care independently.    Member denies any concerns at this time, advised to contact this care manager with questions.  Plan:   Will follow up within the next month.  THN CM Care Plan Problem One     Most Recent Value  Care Plan Problem One  Knowledge deficit regarding management of heart failure due to dementia   Role Documenting the Problem One  Care Management [place] for Problem One  Active  THN Long Term Goal   Member will verbalize yellow zone on heart failure action plan within the next 45 days  THN Long Term Goal Start Date  07/27/18  [place] for Problem One Long Term Goal  Member re-educated on heart failure zones and when to contact MD  [facility] CM Short Term Goal #1   Member will report weighing self daily and recording weights over the next 4 weeks  THN CM Short Term Goal #1 Start Date  07/27/18  Interventions for Short Term Goal #1  re-educated on importance of daily weights and fluid management. provided with calendar/log for recording     Valente David, RN, MSN [place] Manager [phone]

## 2018-08-07 ENCOUNTER — Ambulatory Visit (INDEPENDENT_AMBULATORY_CARE_PROVIDER_SITE_OTHER): Payer: Medicare HMO | Admitting: Pharmacist

## 2018-08-07 ENCOUNTER — Encounter: Payer: Medicare HMO | Admitting: Physician Assistant

## 2018-08-07 DIAGNOSIS — N184 Chronic kidney disease, stage 4 (severe): Secondary | ICD-10-CM | POA: Diagnosis not present

## 2018-08-07 DIAGNOSIS — Z5181 Encounter for therapeutic drug level monitoring: Secondary | ICD-10-CM | POA: Diagnosis not present

## 2018-08-07 DIAGNOSIS — Z86711 Personal history of pulmonary embolism: Secondary | ICD-10-CM

## 2018-08-07 LAB — POCT INR: INR: 2.7 (ref 2.0–3.0)

## 2018-08-07 NOTE — Patient Instructions (Signed)
Description   Continue taking normal dose of Coumadin 1 tablet (2.5mg ) daily except 1/2 tablet (1.25mg ) Tuesdays, Thursdays, and Saturdays.  Keep dark greens consistent at 4 times a week on Tuesday, Thursday, Saturday, and Sunday.  Recheck INR in 4 weeks.  Call if needed any new medications or if scheduled for any procedures  any concerns  [phone]

## 2018-08-12 ENCOUNTER — Telehealth: Payer: Self-pay | Admitting: Neurology

## 2018-08-12 MED ORDER — PREGABALIN 75 MG PO CAPS: 75.0000 mg | ORAL_CAPSULE | Freq: Every day | ORAL | 2 refills | Status: AC

## 2018-08-12 NOTE — Telephone Encounter (Signed)
I called the daughter.  The patient is too sedated on the 100 mg of Lyrica at night, he is not getting pain relief with 50 mg, we will go to 75 mg at night and see if this helps her symptoms.

## 2018-08-12 NOTE — Telephone Encounter (Signed)
Pt daughter(on dpr-[place],Ruzie) calling re: the Lyrica, she states when pt was taking the 2 a day she noticed falling asleep at the table not really being herself so daughter has reduced her down to 1 a day for about a week now and pt called her last night complaining of pain in hands keeping her awake at night.  Daughter Artemio Aly is asking for a call please

## 2018-08-12 NOTE — Addendum Note (Signed)
Addended by: Kathrynn Ducking on: 08/12/2018 10:28 AM   Modules accepted: Orders

## 2018-08-17 NOTE — Progress Notes (Signed)
Cardiology Office Note Date:  08/17/2018  Patient ID:  [name] [name], [name] [name] 06/03/1931, MRN [id number] PCP:  Hoyt Koch, MD  Cardiologist:  Dr. Rayann Heman   Chief Complaint: planned visit  History of Present Illness: [name] [name] is a 84 y.o. female with history of SSSx w/PPM, anxiety, AFib, chronic CHF (diastolic), CBP, DM, remote hx of DVT, HTN, chronic venous insufficiency comes in today to be seen for Dr. Rayann Heman, last seen by him 12/25/16, at that time despite amiodarone was still having a fair amount of AF, her BB was up-titrated and suspected that rate control strategies may be the ultimate POC, and not felt to be an ablation candidate.  Older hospital record notes some concern of ability to care for herself, though the family had reported strong support with daily home visits  She was seen by L.Dorene Ar, NP for ER f/u visit with c/o SOB, CXR with Stable cardiomegaly, changes of COPD and chronic bronchitis, aortic atherosclerosis and large hiatal hernia, she was found with fluid OL and recommended temp increase in her diuresis, suspected her PAF was contributing as well to her acute/chronic CHF.  The patient was seen in f/u by myself 01/10/17 accompanied by her daughter.  They reported then in  regards to her swelling, this had completely resolved.  Her weight was only down one pound but they report her initially as having severe swelling, and now back to baseline whih was none.  She fell unfortunately, 01/04/17.   She reported an event in the BR, urinated and upon standing feels like her legs just gave out.  She is certain she did not black out or faint, she recalls the event, did not hit her head or have an injury but was to weak to to get up.   In general since her hospital stay in January she doesn't feel like she has gotten back to her self.    It was noted that there was confusion in exactly how she was taking her medicines, particularly her diurtetic was mildly othostatic,  though still felt volume OL and planned for short course of increased lasix and planned for close f/u.  Rate response was programmed on in effort to improve her feeling of fatigue  She was seen in close f/u 01/20/17, she was feeling improvement in her SOB and much less edematous. Her lasix adjusted with continued waxing/waning edema, she was stioll having PAF with SR (APing 77% and her amiodarone continued with thought to do PFTs when fluid OL was resolved/better controlled.  She had June hospital visit with persistent and onoing functioinal decline, progressive fatigue and SOB, unable to get home PT arranged via PMD.  She was observed in the ER to have wide complex rhythm and EP was consulted.  She was in SR, though had been having PA.  Wide complex rhythm was able to be re-produced and felt to be pacing behavior 2/2 rate response and this was programmed off, she was not felt to be having VT.  She was observed upon getting up apparently by ER staff to be quite weak and desaturated and admitted for observation discharged after a day or so, reportedly feeling some improvement.  Readmitted 03/18/17 with acutely worsening SOB, initially suspected to be pneumonia though CT findings were suspect to be amioarone toxicity, the amiodarone was stopped by medicine team and given Prednisone dose pack. And instructed for out patient pulmonary evaluation  July 2018 I saw her, she was seen, report to be taking the steroid dose  pack as directed, her daughter accompanies her there is a home visiting RN and PT and a  Caregiver that now visit twice daily.  She suspected her Mom was over using her pain medicine that was contributing to her weakness and lethargy, and has been discontinued.  Both the patient and her daughter have reported a marked improvement.  The patient denies any kind of CP or palpitations, her SOB was steadily improving, no dizziness, near syncope or syncope.  She has seen her PMD and has been  recommended/referred for  a nephrology evaluation but was not referred to a pulmonologist.  She was referred to pulmonary.  Most recently saw Dr. Rayann Heman in Sept, noted continued cognitive decline (follows with neurology), DOE, and has been in persistent AFib.  Given severe bi-atrial enlargement, rate control felt best strategy. DOE thought to be multifocal, including deconditioning and recommended conservative strategy likely most beneficial, did not recommend CV procedures.  She is accompanied by her daughter who cares for her.  The patient's only complaint today is of terrible pain in her hands.  There are no c/o CP, palpitations, no rest SOB, no dizziness, near syncope or syncope.  She is very sedentary, gets winded easily, this dates back well over a year.     Device information: MDT dual chamber PPM, implanted 06/30/09, SSSx, Dr. Rayann Heman AF hx: AAD amiodarone, started Jan 2018 w/episode rapid AFlutter Hx of Flecainide d/c 2/2 CHF and marked PR prolongation to >434m Amio stopped June 2018 concerns of pulm tox  Past Medical History:  Diagnosis Date  . Acute gastric ulcer without mention of hemorrhage, perforation, or obstruction 2006   EGD  . Anxiety   . Atrial fibrillation (HCC)    flecainide; coumadin  . Breast cancer ([place]    left  . Carotid artery disease ([facility]    154/27 RICA 00-62% LICA 437-62% //  [place]/15 RICA 11-76% LICA 616-07%>> FU 1 year   . Chronic diastolic heart failure (HCC)    a. echo 10/10: mild LVH, EF 55-60%, mild AI, mild MR, mild to mod LAE, mild RVE, severe RAE, mod TR, PASP 53  /  b.  Echo 3/14:  Mild LVH, EF 55%, Tr AI, MAC, mild MR, mod LAE, PASP 31, trivial eff. // c. echo 1/17: EF 55-60%, mild AI, MAC, mild MR, moderate BAE, moderate TR  . Chronic lower back pain   . Diabetic peripheral neuropathy ([facility] 03/09/2015  . Diverticulosis   . Esophagitis, unspecified 2006   EGD  . Hiatal hernia 2006   EGD  . History of nuclear stress test    a. Myoview  2/12: EF 69%, no scar or ischemia  . Hx of adenomatous colonic polyps   . Hypertension   . Mediastinal lymphadenopathy   . Memory difficulty 11/14/2016  . Mitral regurgitation    mild, echo, October, 2010  . Osteopenia   . Presence of permanent cardiac pacemaker   . [facility]   2006, with DVT  . Pulmonary HTN ([place]    53 mmHg echo, 2010, moderate TR, mild right ventricular enlargement  . Tachy-brady syndrome ([facility]    s/p pacer 07/2009  . Thyroid nodule    non-neoplastic goiter  . Type II diabetes mellitus ([facility]   . Venous insufficiency    Chronic    Past Surgical History:  Procedure Laterality Date  . ABDOMINAL HYSTERECTOMY    . ANKLE FRACTURE SURGERY Right   . BACK SURGERY    .  BREAST LUMPECTOMY Left   . FRACTURE SURGERY    . INSERT / REPLACE / REMOVE PACEMAKER  06/30/2009   MDT Adalpta L implanted by Dr Olevia Perches  . LUMBAR LAMINECTOMY  1973, 06/22/2011   "no hardware either time"    Current Outpatient Medications  Medication Sig Dispense Refill  . acetaminophen (TYLENOL) 325 MG tablet Take 650 mg by mouth every 6 (six) hours as needed for mild pain.    . bethanechol (URECHOLINE) 25 MG tablet Take 25 mg by mouth 2 (two) times daily.    . blood glucose meter kit and supplies KIT Dispense based on patient and insurance preference. Use before breakfast and at bedtime. Diagnosis code: E11.9 1 each 2  . Dextrose-Fructose-Sod Citrate (NAUZENE) [phone] MG CHEW Chew 1 tablet by mouth as needed (for nausea).    . furosemide (LASIX) 80 MG tablet TAKE 1 TABLET (80 MG TOTAL) BY MOUTH 2 (TWO) TIMES DAILY. 180 tablet 3  . memantine (NAMENDA) 5 MG tablet Take 1 tablet (5 mg total) by mouth 2 (two) times daily. 180 tablet 3  . metoprolol succinate (TOPROL-XL) 25 MG 24 hr tablet TAKE 3 TABLETS BY MOUTH DAILY 90 tablet 0  . potassium chloride 20 MEQ/15ML (10%) SOLN Take 20 mEq by mouth daily as needed (if have not eaten a banana).    . pregabalin (LYRICA) 75 MG capsule  Take 1 capsule (75 mg total) by mouth at bedtime. 30 capsule 2  . promethazine (PHENERGAN) 25 MG tablet Take 1 tablet (25 mg total) by mouth every 8 (eight) hours as needed for nausea or vomiting. 20 tablet 0  . pyridoxine (B-6) 200 MG tablet Take 200 mg by mouth daily.    Marland Kitchen triamcinolone cream (KENALOG) 0.1 % Apply 1 application topically 2 (two) times daily. 100 g 2  . vitamin B-12 (CYANOCOBALAMIN) 500 MCG tablet Take 500 mcg by mouth daily.     Marland Kitchen warfarin (COUMADIN) 2.5 MG tablet TAKE 1/2 TABLET TO 1 TABLET DAILY OR AS DIRECTED BY COUMADIN CLINIC 90 tablet 1   No current facility-administered medications for this visit.     Allergies:   Amiodarone; Flecainide; Morphine and related; Bactrim ds [sulfamethoxazole-trimethoprim]; Cephalexin; and Pineapple   Social History:  The patient  reports that she quit smoking about 19 years ago. Her smoking use included cigarettes. She has a 47.00 pack-year smoking history. She has never used smokeless tobacco. She reports that she does not drink alcohol or use drugs.   Family History:  The patient's family history includes COPD in her father; Heart disease in her mother; Hypertension in her brother, daughter, father, mother, and sister.   ROS:  Please see the history of present illness.  All other systems are reviewed and otherwise negative.   PHYSICAL EXAM:  VS:  There were no vitals taken for this visit. BMI: There is no height or weight on file to calculate BMI. Well nourished, well developed, in no acute distress  HEENT: normocephalic, atraumatic  Neck: no JVD, carotid bruits or masses Cardiac:  irreg-irreg; no significant murmurs, no rubs, or gallops Lungs: CTA b/l, no wheezing, rhonchi or rales, no abnormal lungs sounds are appreciated today Abd: soft, nontender MS: no deformity or atrophy Ext:  no edema b/l Skin: warm and dry, no rash Neuro:  No gross deficits appreciated Psych: euthymic mood, full affect  PPM site is stable, no  tethering or discomfort   EKG:  Not done today PPM interrogation done today and reviewed by myself: battery and  lead measurement are good, 100% AF, avg HR 80's-100, though has some HR faster, though not unlike prior checks  06/17/16: [facility]   Nuclear stress EF: 69%.  There was no ST segment deviation noted during stress.  Defect 1: There is a small defect of mild severity present in the basal inferolateral and mid inferolateral location. This defect is partially reversible and could represent a very small area of ischemia. Clinical correlation recommended.  This is a low risk study.  The left ventricular ejection fraction is hyperdynamic (>65%).    04/13/16 ECHO Study Conclusions - Left ventricle: The cavity size was normal. There was mild concentric hypertrophy. Systolic function was normal. The estimated ejection fraction was in the range of 55% to 60%. Wall motion was normal; there were no regional wall motion abnormalities. Doppler parameters are consistent with restrictive physiology, indicative of decreased left ventricular diastolic compliance and/or increased left atrial pressure. Doppler parameters are consistent with elevated ventricular end-diastolic filling pressure. - Aortic valve: There was mild regurgitation. - Aortic root: The aortic root was normal in size. - Ascending aorta: The ascending aorta was normal in size. - Mitral valve: Calcified annulus. Mildly thickened leaflets . There was mild regurgitation. - Left atrium: The atrium was moderately dilated. - Right ventricle: Pacer wire or catheter noted in right ventricle. - Right atrium: Pacer wire or catheter noted in right atrium. - Tricuspid valve: There was moderate regurgitation. - Pulmonic valve: There was no regurgitation. - Pulmonary arteries: Systolic pressure was mildly increased. PA peak pressure: 38 mm Hg (S). - Inferior vena cava: The vessel was  normal in size. - Pericardium, extracardiac: There was no pericardial effusion.    Recent Labs: 05/11/2018: TSH 1.906 05/12/2018: ALT 13 05/20/2018: B Natriuretic Peptide 914.7; BUN 31; Creatinine, Ser 1.98; Hemoglobin 12.7; Platelets 225; Potassium 4.1; Sodium 144  No results found for requested labs within last 8760 hours.   CrCl cannot be calculated (Patient's most recent lab result is older than the maximum 21 days allowed.).   Wt Readings from Last 3 Encounters:  07/27/18 223 lb (101.2 kg)  07/23/18 224 lb (101.6 kg)  06/18/18 230 lb (104.3 kg)     Other studies reviewed: Additional studies/records reviewed today include: summarized above  ASSESSMENT AND PLAN:  1. CHF disatolic     Her exam is euvolemic      2. persistent AFib  >> permanent     CHA2Ds2Vasc is 5, on warfarin, monitored and managed with the coumadin clnic      3. HTN    No changes  4. PPM     Intact function  5. DOE     Agree, likely is multifactorial, is not new     Advancing age, deconditioning, +/- Emphysema, she has been rx inhaler though not using     Discussed with the patient/daughter Dr. Jackalyn Lombard thoughts regarding a more conservative approach from our perspective and avoid cardiac procedures, they are in agreement     They are recommended to see if her PMD felt like she may benefit from home PT/aid, help at home     Disposition:  Q 3 mo remotes, in-clinic visit 6 months.  She is seeing her PMD, neurologist and nephrologist regularly, feel like we can move out her visits with [place] some.  Current medicines are reviewed at length with the patient today.  The patient did not have any concerns regarding medicines.  Haywood Lasso, PA-C 08/17/2018 11:25 AM  [place]  [place] [address] [phone] (office)  [phone] (fax)

## 2018-08-18 ENCOUNTER — Ambulatory Visit: Payer: Medicare HMO | Admitting: Physician Assistant

## 2018-08-18 VITALS — BP 110/72 | HR 86 | Ht 72.0 in | Wt 220.0 lb

## 2018-08-18 DIAGNOSIS — R0602 Shortness of breath: Secondary | ICD-10-CM

## 2018-08-18 DIAGNOSIS — I10 Essential (primary) hypertension: Secondary | ICD-10-CM

## 2018-08-18 DIAGNOSIS — I4821 Permanent atrial fibrillation: Secondary | ICD-10-CM | POA: Diagnosis not present

## 2018-08-18 DIAGNOSIS — I5032 Chronic diastolic (congestive) heart failure: Secondary | ICD-10-CM | POA: Diagnosis not present

## 2018-08-18 DIAGNOSIS — Z950 Presence of cardiac pacemaker: Secondary | ICD-10-CM

## 2018-08-18 NOTE — Patient Instructions (Addendum)
Medication Instructions:   Your physician recommends that you continue on your current medications as directed. Please refer to the Current Medication list given to you today.  If you need a refill on your cardiac medications before your next appointment, please call your pharmacy.   Lab work:  NONE ORDERED  TODAY  If you have labs (blood work) drawn today and your tests are completely normal, you will receive your results only by: Marland Kitchen MyChart Message (if you have MyChart) OR . A paper copy in the mail If you have any lab test that is abnormal or we need to change your treatment, we will call you to review the results.  Testing/Procedures:  NONE ORDERED  TODAY   Follow-Up: At [facility], you and your health needs are our priority.  As part of our continuing mission to provide you with exceptional heart care, we have created designated Provider Care Teams.  These Care Teams include your primary Cardiologist (physician) and Advanced Practice Providers (APPs -  Physician Assistants and Nurse Practitioners) who all work together to provide you with the care you need, when you need it. You will need a follow up appointment in 6 months.  Please call our office 2 months in advance to schedule this appointment.  You may see  URSUY  or one of the following Advanced Practice Providers on your dAny Other Special Instructions Will Be Listed Below (If Applicable).  Remote monitoring is used to monitor your Pacemaker of ICD from home. This monitoring reduces the number of office visits required to check your device to one time per year. It allows [place] to keep an eye on the functioning of your device to ensure it is working properly. You are scheduled for a device check from home on .09-02-18  You may send your transmission at any time that day. If you have a wireless device, the transmission will be sent automatically. After your physician reviews your transmission, you will receive a postcard with your  next transmission date.

## 2018-08-24 ENCOUNTER — Other Ambulatory Visit: Payer: Self-pay | Admitting: Neurology

## 2018-08-24 ENCOUNTER — Other Ambulatory Visit: Payer: Self-pay | Admitting: *Deleted

## 2018-08-24 MED ORDER — DULOXETINE HCL 30 MG PO CPEP: 30.0000 mg | ORAL_CAPSULE | Freq: Every day | ORAL | 3 refills | Status: DC

## 2018-08-24 NOTE — Patient Outreach (Signed)
[facility]) Care Management  08/24/2018  [name] Cumbie [birth year] [id number]   Call placed to member to follow up on current health status.  She report she is doing well.  Denies any shortness of breath today, has not weighed herself.  Does not remember being advised to weigh daily.  Report compliance with medications and MD appointments  Call placed to daughter [name], no answer.  HIPAA compliant voice message left.  Will await call back.     Update:  Call received back from daughter. She state member has some good days, some bad.  Overall member has been doing well.  Was seen by the neurologist due to nerve pain, Lyrica was increased and member was placed on Cymbalta.  She report was seen by cardiology and PT was recommended, she has contacted primary MD office for referral.    Daughter expresses frustration regarding assisting member with following plan of care regarding heart failure management due to dementia.  She is interested in getting additional support in the home but is unable to afford independently.  She has 2 brothers that provide minimal support to member.  Daughter will go to social services to apply for Medicaid on behalf of member in effort to obtain in home support.  She has contact information for social worker should she need assistance.  This care manager discussed transition to health coach as member has been stable the past few months, daughter agrees.  Will place referral and notify primary MD of transition.  [facility] CM Care Plan Problem One     Most Recent Value  Care Plan Problem One  Knowledge deficit regarding management of heart failure due to dementia   Role Documenting the Problem One  Care Management [place] for Problem One  Active  [facility] Long Term Goal   Member will verbalize yellow zone on heart failure action plan within the next 45 days  TH[facility] Long Term Goal Start Date  07/27/18  [facility] Long Term Goal Met Date  08/24/18   [facility] CM Short Term Goal #1   Member will report weighing self daily and recording weights over the next 4 weeks  [facility] CM Short Term Goal #1 Start Date  07/27/18  [facility] CM Short Term Goal #1 Met Date  -- [not consistently met]     Valente David, RN, MSN [place] Manager [phone]

## 2018-08-30 ENCOUNTER — Other Ambulatory Visit: Payer: Self-pay

## 2018-08-30 ENCOUNTER — Emergency Department (HOSPITAL_COMMUNITY): Payer: Medicare HMO

## 2018-08-30 ENCOUNTER — Emergency Department (HOSPITAL_COMMUNITY)
Admission: EM | Admit: 2018-08-30 | Discharge: 2018-08-30 | Disposition: A | Discharge status: Home / Self Care | Payer: Medicare HMO | Attending: Emergency Medicine | Admitting: Emergency Medicine

## 2018-08-30 ENCOUNTER — Encounter (HOSPITAL_COMMUNITY): Payer: Self-pay

## 2018-08-30 DIAGNOSIS — I4891 Unspecified atrial fibrillation: Secondary | ICD-10-CM | POA: Insufficient documentation

## 2018-08-30 DIAGNOSIS — N184 Chronic kidney disease, stage 4 (severe): Secondary | ICD-10-CM | POA: Insufficient documentation

## 2018-08-30 DIAGNOSIS — E1122 Type 2 diabetes mellitus with diabetic chronic kidney disease: Secondary | ICD-10-CM | POA: Diagnosis not present

## 2018-08-30 DIAGNOSIS — I130 Hypertensive heart and chronic kidney disease with heart failure and stage 1 through stage 4 chronic kidney disease, or unspecified chronic kidney disease: Secondary | ICD-10-CM | POA: Diagnosis not present

## 2018-08-30 DIAGNOSIS — Z7901 Long term (current) use of anticoagulants: Secondary | ICD-10-CM | POA: Diagnosis not present

## 2018-08-30 DIAGNOSIS — R52 Pain, unspecified: Secondary | ICD-10-CM | POA: Diagnosis not present

## 2018-08-30 DIAGNOSIS — M25531 Pain in right wrist: Secondary | ICD-10-CM | POA: Diagnosis not present

## 2018-08-30 DIAGNOSIS — Z87891 Personal history of nicotine dependence: Secondary | ICD-10-CM | POA: Diagnosis not present

## 2018-08-30 DIAGNOSIS — I2510 Atherosclerotic heart disease of native coronary artery without angina pectoris: Secondary | ICD-10-CM | POA: Diagnosis not present

## 2018-08-30 DIAGNOSIS — I5032 Chronic diastolic (congestive) heart failure: Secondary | ICD-10-CM | POA: Diagnosis not present

## 2018-08-30 DIAGNOSIS — R609 Edema, unspecified: Secondary | ICD-10-CM | POA: Diagnosis not present

## 2018-08-30 DIAGNOSIS — R0902 Hypoxemia: Secondary | ICD-10-CM | POA: Diagnosis not present

## 2018-08-30 DIAGNOSIS — M25431 Effusion, right wrist: Secondary | ICD-10-CM | POA: Diagnosis not present

## 2018-08-30 DIAGNOSIS — M25539 Pain in unspecified wrist: Secondary | ICD-10-CM | POA: Diagnosis not present

## 2018-08-30 LAB — CBC WITH DIFFERENTIAL/PLATELET
Abs Immature Granulocytes: 0.05 10*3/uL (ref 0.00–0.07)
BASOS ABS: 0 10*3/uL (ref 0.0–0.1)
Basophils Relative: 0 %
Eosinophils Absolute: 0 10*3/uL (ref 0.0–0.5)
Eosinophils Relative: 0 %
HCT: 42.9 % (ref 36.0–46.0)
Hemoglobin: 13.3 g/dL (ref 12.0–15.0)
Immature Granulocytes: 1 %
LYMPHS ABS: 0.5 10*3/uL — AB (ref 0.7–4.0)
Lymphocytes Relative: 7 %
MCH: 27.5 pg (ref 26.0–34.0)
MCHC: 31 g/dL (ref 30.0–36.0)
MCV: 88.8 fL (ref 80.0–100.0)
Monocytes Absolute: 0.6 10*3/uL (ref 0.1–1.0)
Monocytes Relative: 9 %
NEUTROS PCT: 83 %
NRBC: 0 % (ref 0.0–0.2)
Neutro Abs: 5.5 10*3/uL (ref 1.7–7.7)
Platelets: 231 10*3/uL (ref 150–400)
RBC: 4.83 MIL/uL (ref 3.87–5.11)
RDW: 20.2 % — ABNORMAL HIGH (ref 11.5–15.5)
WBC: 6.6 10*3/uL (ref 4.0–10.5)

## 2018-08-30 LAB — BASIC METABOLIC PANEL
Anion gap: 12 (ref 5–15)
BUN: 26 mg/dL — ABNORMAL HIGH (ref 8–23)
CO2: 23 mmol/L (ref 22–32)
Calcium: 9.1 mg/dL (ref 8.9–10.3)
Chloride: 106 mmol/L (ref 98–111)
Creatinine, Ser: 1.53 mg/dL — ABNORMAL HIGH (ref 0.44–1.00)
GFR calc non Af Amer: 31 mL/min — ABNORMAL LOW (ref >60)
GFR, EST AFRICAN AMERICAN: 36 mL/min — AB (ref >60)
Glucose, Bld: 161 mg/dL — ABNORMAL HIGH (ref 70–99)
Potassium: 3.6 mmol/L (ref 3.5–5.1)
Sodium: 141 mmol/L (ref 135–145)

## 2018-08-30 LAB — PROTIME-INR
INR: 2.33
Prothrombin Time: 25.2 seconds — ABNORMAL HIGH (ref 11.4–15.2)

## 2018-08-30 LAB — SEDIMENTATION RATE: Sed Rate: 43 mm/hr — ABNORMAL HIGH (ref 0–22)

## 2018-08-30 LAB — I-STAT CG4 LACTIC ACID, ED: Lactic Acid, Venous: 1.65 mmol/L (ref 0.5–1.9)

## 2018-08-30 LAB — C-REACTIVE PROTEIN: CRP: 6.5 mg/dL — AB (ref <1.0)

## 2018-08-30 MED ORDER — ACETAMINOPHEN 500 MG PO TABS
1000.0000 mg | ORAL_TABLET | Freq: Once | ORAL | Status: AC
  Administered 2018-08-30: 1000 mg via ORAL
  Filled 2018-08-30: qty 2

## 2018-08-30 NOTE — ED Notes (Signed)
Bed: WA01 Expected date:  Expected time:  Means of arrival:  Comments: 

## 2018-08-30 NOTE — ED Triage Notes (Signed)
Patient presented to ed with c/o right wrist pain and swelling and redness.

## 2018-08-30 NOTE — Discharge Instructions (Signed)
You were seen in the ED today with right wrist pain. This is likely due to an injury from a recent fall. There is a questionable fracture on x-ray. We have placed you in a splint which will need to stay clean and dry. You need to call the Orthopedic surgeon listed below, Dr. Griffin Basil. Call tomorrow to schedule a follow up appointment.   Return to the ED immediately with any worsening pain, fever, chills, or redness spreading up the arm.

## 2018-08-30 NOTE — ED Provider Notes (Signed)
Emergency Department Provider Note   I have reviewed the triage vital signs and the nursing notes.   HISTORY  Chief Complaint No chief complaint on file.   HPI [name] [name] is a 84 y.o. female with PMH of A-fib on Coumadin, CAD, dCHF, prior PE, and pulmonary HTN presents to the emergency department for evaluation of right wrist pain and swelling.  Patient has had symptoms for the past 3 to 4 days which are progressively worsening.  Patient initially was prescribing a cream used to treat neuropathy but symptoms worsen.  She is noticed swelling and redness over the wrist and has severe pain with even very mild movement of the wrist.  Denies any trauma to the area.  She has not had fevers or chills.  No prior history of gout. No other modifying factors.   Past Medical History:  Diagnosis Date  . Acute gastric ulcer without mention of hemorrhage, perforation, or obstruction 2006   EGD  . Anxiety   . Atrial fibrillation (HCC)    flecainide; coumadin  . Breast cancer ([place])    left  . Carotid artery disease ([place])    56/38: RICA 9-37%, LICA 34-28%  //  [place] 76/81: RICA 1-57%; LICA 26-20% >> FU 1 year   . Chronic diastolic heart failure (HCC)    a. echo 10/10: mild LVH, EF 55-60%, mild AI, mild MR, mild to mod LAE, mild RVE, severe RAE, mod TR, PASP 53  /  b.  Echo 3/14:  Mild LVH, EF 55%, Tr AI, MAC, mild MR, mod LAE, PASP 31, trivial eff. // c. echo 1/17: EF 55-60%, mild AI, MAC, mild MR, moderate BAE, moderate TR  . Chronic lower back pain   . Diabetic peripheral neuropathy ([place]) 03/09/2015  . Diverticulosis   . Esophagitis, unspecified 2006   EGD  . Hiatal hernia 2006   EGD  . History of nuclear stress test    a. Myoview 2/12: EF 69%, no scar or ischemia  . Hx of adenomatous colonic polyps   . Hypertension   . Mediastinal lymphadenopathy   . Memory difficulty 11/14/2016  . Mitral regurgitation    mild, echo, October, 2010  . Osteopenia   . Presence of permanent  cardiac pacemaker   . Pulmonary embolus [facility]) 2006   2006, with DVT  . Pulmonary HTN ([place])    53 mmHg echo, 2010, moderate TR, mild right ventricular enlargement  . Tachy-brady syndrome ([place])    s/p pacer 07/2009  . Thyroid nodule    non-neoplastic goiter  . Type II diabetes mellitus ([place])   . Venous insufficiency    Chronic    Patient Active Problem List   Diagnosis Date Noted  . Pruritus 05/08/2018  . Morbid obesity due to excess calories ([place]) complicated by hbp 35/59/7416  . Solitary pulmonary nodule on lung CT 03/21/2017  . Amiodarone toxicity 03/21/2017  . Pacemaker complications 38/45/3646  . CKD (chronic kidney disease), stage IV ([place]) 03/14/2017  . Recurrent falls 01/13/2017  . Cognitive change   . A-fib ([place]) 10/14/2016  . Dysuria 10/10/2016  . Chronic back pain 07/28/2016  . Diabetes mellitus type 2, noninsulin dependent ([place])   . Dyspnea on exertion 04/12/2016  . Hypertensive heart disease 03/28/2016  . Anemia, iron deficiency 10/22/2015  . Diabetic peripheral neuropathy ([place]) 03/09/2015  . Chronic diastolic (congestive) heart failure ([place]) 09/07/2014  . Sick sinus syndrome ([place]) 03/09/2014  . Hypertension 06/21/2013  . Osteopenia   . Pulmonary HTN ([place])   .  History of pulmonary embolism   . Carotid artery disease ([place])   . History of breast cancer   . Pacemaker-Medtronic   . Syncope   . Mitral regurgitation   . Mediastinal lymphadenopathy   . THYROID NODULE, HX OF 07/19/2009    Past Surgical History:  Procedure Laterality Date  . ABDOMINAL HYSTERECTOMY    . ANKLE FRACTURE SURGERY Right   . BACK SURGERY    . BREAST LUMPECTOMY Left   . FRACTURE SURGERY    . INSERT / REPLACE / REMOVE PACEMAKER  06/30/2009   MDT Adalpta L implanted by Dr Olevia Perches  . LUMBAR LAMINECTOMY  1973, 06/22/2011   "no hardware either time"    Allergies Amiodarone; Flecainide; Morphine and related; Bactrim ds [sulfamethoxazole-trimethoprim]; Cephalexin; and Pineapple  Family  History  Problem Relation Age of Onset  . Heart disease Mother   . Hypertension Mother   . COPD Father   . Hypertension Father   . Hypertension Sister   . Hypertension Brother   . Hypertension Daughter   . Colon cancer Neg Hx   . Heart attack Neg Hx   . Stroke Neg Hx     Social History Social History   Tobacco Use  . Smoking status: Former Smoker    Packs/day: 1.00    Years: 47.00    Pack years: 47.00    Types: Cigarettes    Last attempt to quit: 10/26/1998    Years since quitting: 19.8  . Smokeless tobacco: Never Used  Substance Use Topics  . Alcohol use: No    Comment: 10/14/2016 "I'll have a beer q once in awhile"  . Drug use: No    Review of Systems  Constitutional: No fever/chills Eyes: No visual changes. ENT: No sore throat. Cardiovascular: Denies chest pain. Respiratory: Denies shortness of breath. Gastrointestinal: No abdominal pain.  No nausea, no vomiting.  No diarrhea.  No constipation. Genitourinary: Negative for dysuria. Musculoskeletal: Severe right wrist pain and swelling.  Skin: Right wrist redness.  Neurological: Negative for headaches, focal weakness or numbness.  10-point ROS otherwise negative.  ____________________________________________   PHYSICAL EXAM:  VITAL SIGNS: ED Triage Vitals  Enc Vitals Group     BP 08/30/18 1143 (!) 126/97     Pulse Rate 08/30/18 1143 77     Resp 08/30/18 1143 18     Temp 08/30/18 1143 98.2 F (36.8 C)     Temp Source 08/30/18 1143 Oral     SpO2 08/30/18 1115 94 %     Pain Score 08/30/18 1143 5   Constitutional: Alert and oriented. Well appearing and in no acute distress. Eyes: Conjunctivae are normal. Head: Atraumatic. Nose: No congestion/rhinnorhea. Mouth/Throat: Mucous membranes are moist.  Neck: No stridor.  Cardiovascular: Normal rate, regular rhythm. Good peripheral circulation. Grossly normal heart sounds.   Respiratory: Normal respiratory effort.  No retractions. Lungs  CTAB. Gastrointestinal: Soft and nontender. No distention.  Musculoskeletal: Positive right wrist swelling dorsally with mild warmth and mild focal erythema. Limited ROM due to pain.  Neurologic:  Normal speech and language. No gross focal neurologic deficits are appreciated.  Skin:  Skin is warm, dry and intact. No rash noted.  ____________________________________________   LABS (all labs ordered are listed, but only abnormal results are displayed)  Labs Reviewed  BASIC METABOLIC PANEL - Abnormal; Notable for the following components:      Result Value   Glucose, Bld 161 (*)    BUN 26 (*)    Creatinine, Ser 1.53 (*)  GFR calc non Af Amer 31 (*)    GFR calc Af Amer 36 (*)    All other components within normal limits  CBC WITH DIFFERENTIAL/PLATELET - Abnormal; Notable for the following components:   RDW 20.2 (*)    Lymphs Abs 0.5 (*)    All other components within normal limits  SEDIMENTATION RATE - Abnormal; Notable for the following components:   Sed Rate 43 (*)    All other components within normal limits  C-REACTIVE PROTEIN - Abnormal; Notable for the following components:   CRP 6.5 (*)    All other components within normal limits  PROTIME-INR - Abnormal; Notable for the following components:   Prothrombin Time 25.2 (*)    All other components within normal limits  I-STAT CG4 LACTIC ACID, ED   ____________________________________________  RADIOLOGY  Dg Wrist Complete Right  Result Date: 08/30/2018 CLINICAL DATA:  Right wrist pain after injury EXAM: RIGHT WRIST - COMPLETE 3+ VIEW COMPARISON:  None. FINDINGS: Mild diffuse right wrist soft tissue swelling. Slight cortical irregularity along the radial margin of the mid scaphoid, cannot exclude nondisplaced scaphoid fracture. No additional potential fracture. No dislocation. Mild chondrocalcinosis in the triangular fibrocartilage. Mild radial side right wrist osteoarthritis. No suspicious focal osseous lesions. No radiopaque  foreign body. IMPRESSION: Mild diffuse right wrist soft tissue swelling. Slight cortical irregularity along the radial mid scaphoid margin, cannot exclude nondisplaced scaphoid fracture. MRI of the right wrist could be obtained for further evaluation as clinically warranted. No dislocation. Electronically Signed   By: Ilona Sorrel M.D.   On: 08/30/2018 13:10    ____________________________________________   PROCEDURES  Procedure(s) performed:   Procedures  None ____________________________________________   INITIAL IMPRESSION / ASSESSMENT AND PLAN / ED COURSE  Pertinent labs & imaging results that were available during my care of the patient were reviewed by me and considered in my medical decision making (see chart for details).  Patient presents to the emergency department for evaluation of right wrist pain, swelling, redness.  The area is warm to touch.  There is focal erythema over the dorsal right wrist with diffuse swelling.  No fevers at home.  No spreading of erythema up the arm.  No history of gout.  No trauma history.  Suspicion is elevated for possible septic joint given exam.  Could also be an inflammatory arthritis.  Plan for x-ray of the right wrist to rule out bony involvement along with labs, ESR, CRP, Ortho consultation.  Daughter now at bedside. She reports a fall yesterday. Patient told her that she fell from a chair and caught herself with both hands. She began complaining of pain shortly afterwards. Daughter soaked in epsom salts and applied an ACE wrap. With swelling this AM and pain the patient called EMS.   Plain film and labs reviewed. Possible scaphoid fracture which corresponds with pain and swelling. Discussed with Dr. Griffin Basil on for Ortho. Discussed the redness, swelling, pain, and labs. No fever. New story from daughter with trauma yesterday and pain shortly afterwards. Agree that septic joint in this setting is far less likely. Plan for splinting and outpatient  f/u with Dr. Griffin Basil this week. Discussed results and plan with patient who will call office tomorrow for an appointment.  ____________________________________________  FINAL CLINICAL IMPRESSION(S) / ED DIAGNOSES  Final diagnoses:  Right wrist pain    MEDICATIONS GIVEN DURING THIS VISIT:  Medications  acetaminophen (TYLENOL) tablet 1,000 mg (1,000 mg Oral Given 08/30/18 1155)    Note:  This document  was prepared using Systems analyst and may include unintentional dictation errors.  Nanda Quinton, MD Emergency Medicine    Long, Wonda Olds, MD 08/30/18 2024

## 2018-08-31 ENCOUNTER — Ambulatory Visit (INDEPENDENT_AMBULATORY_CARE_PROVIDER_SITE_OTHER): Payer: Medicare HMO | Admitting: Cardiovascular Disease

## 2018-08-31 DIAGNOSIS — Z86711 Personal history of pulmonary embolism: Secondary | ICD-10-CM

## 2018-09-01 ENCOUNTER — Ambulatory Visit: Payer: Self-pay | Admitting: *Deleted

## 2018-09-01 DIAGNOSIS — M25531 Pain in right wrist: Secondary | ICD-10-CM | POA: Diagnosis not present

## 2018-09-01 NOTE — Telephone Encounter (Signed)
Patient's daughter is calling to report that her mother is having increased weakness. Mother did not have any energy in her legs today. Mother has hand injury- she is not able to use it to help push up or lift. Patient is not eating a lot. Mother has memory issues and daughter states when she arrived today- her mother had slept in her lift chair- she could not stand on her on. She had not eaten or taken medication. She did manage to get her to the orthopedic appointment and back home. Throughout the day- mother has improved- and is able to get up and go to the bathroom. Patient has taken off work this week to monitor her and make sure she eats and takes her medication.  Patient has appointment this Friday for follow up to hospital visit- she can bring her any time this week-scheduled appointment for patient to be evaluate tomorrow. Advised she needs to start to monitor her to see if she is improving with the help she is giving her and if she feels patient is safe to stay alone- she may need assistance in the home if she can not care for herself.    Daughter is answering questions: Reason for Disposition . [1] MODERATE weakness (i.e., interferes with work, school, normal activities) AND [2] cause unknown  (Exceptions: weakness with acute minor illness, or weakness from poor fluid intake)  Answer Assessment - Initial Assessment Questions 1. DESCRIPTION: "Describe how you are feeling."     Arm pain- patient is having trouble getting up 2. SEVERITY: "How bad is it?"  "Can you stand and walk?"   - MILD - Feels weak or tired, but does not interfere with work, school or normal activities   - [place] to stand and walk; weakness interferes with work, school, or normal activities   - SEVERE - Unable to stand or walk     Since the injury- patient was unable to get up unassisted this morning- she sat up all night- daughter has taken off work this week to stay with her 3. ONSET:  "When did the weakness  begin?"     today 4. CAUSE: "What do you think is causing the weakness?"     Injury along with the neuropathy- and she sat all night  5. MEDICINES: "Have you recently started a new medicine or had a change in the amount of a medicine?"     no 6. OTHER SYMPTOMS: "Do you have any other symptoms?" (e.g., chest pain, fever, cough, SOB, vomiting, diarrhea, bleeding, other areas of pain)     Pain in hand from injury 7. PREGNANCY: "Is there any chance you are pregnant?" "When was your last menstrual period?"     n/a  Protocols used: WEAKNESS (GENERALIZED) AND FATIGUE-A-AH

## 2018-09-01 NOTE — Telephone Encounter (Signed)
Noted  

## 2018-09-02 ENCOUNTER — Ambulatory Visit (INDEPENDENT_AMBULATORY_CARE_PROVIDER_SITE_OTHER): Payer: Medicare HMO | Admitting: Internal Medicine

## 2018-09-02 ENCOUNTER — Encounter: Payer: Self-pay | Admitting: Internal Medicine

## 2018-09-02 ENCOUNTER — Ambulatory Visit (INDEPENDENT_AMBULATORY_CARE_PROVIDER_SITE_OTHER): Payer: Medicare HMO

## 2018-09-02 ENCOUNTER — Telehealth: Payer: Self-pay

## 2018-09-02 VITALS — BP 118/80 | HR 97 | Temp 97.9°F | Ht 72.0 in | Wt 221.0 lb

## 2018-09-02 DIAGNOSIS — R300 Dysuria: Secondary | ICD-10-CM

## 2018-09-02 DIAGNOSIS — R29818 Other symptoms and signs involving the nervous system: Secondary | ICD-10-CM | POA: Diagnosis not present

## 2018-09-02 DIAGNOSIS — I495 Sick sinus syndrome: Secondary | ICD-10-CM

## 2018-09-02 DIAGNOSIS — R531 Weakness: Secondary | ICD-10-CM

## 2018-09-02 NOTE — Patient Instructions (Signed)
We are checking the urine and also a CT scan of the head to make sure she has not had a stroke.

## 2018-09-02 NOTE — Telephone Encounter (Signed)
Confirmed remote transmission w/ pt daughter Gari Crown.

## 2018-09-03 ENCOUNTER — Other Ambulatory Visit (INDEPENDENT_AMBULATORY_CARE_PROVIDER_SITE_OTHER): Payer: Medicare HMO

## 2018-09-03 ENCOUNTER — Other Ambulatory Visit: Payer: Self-pay | Admitting: Internal Medicine

## 2018-09-03 ENCOUNTER — Encounter: Payer: Self-pay | Admitting: Internal Medicine

## 2018-09-03 DIAGNOSIS — R300 Dysuria: Secondary | ICD-10-CM | POA: Diagnosis not present

## 2018-09-03 DIAGNOSIS — R531 Weakness: Secondary | ICD-10-CM | POA: Insufficient documentation

## 2018-09-03 LAB — URINALYSIS, ROUTINE W REFLEX MICROSCOPIC
Bilirubin Urine: NEGATIVE
Ketones, ur: NEGATIVE
Nitrite: POSITIVE — AB
Specific Gravity, Urine: 1.01 (ref 1.000–1.030)
Total Protein, Urine: NEGATIVE
Urine Glucose: NEGATIVE
Urobilinogen, UA: 1 (ref 0.0–1.0)
pH: 8 (ref 5.0–8.0)

## 2018-09-03 MED ORDER — CIPROFLOXACIN HCL 500 MG PO TABS: 500.0000 mg | ORAL_TABLET | Freq: Two times a day (BID) | ORAL | 0 refills | Status: AC

## 2018-09-03 NOTE — Progress Notes (Signed)
   Subjective:    Patient ID: [name] [name], female    DOB: November 19, 1930, 84 y.o.   MRN: [id number]  HPI The patient is an 84 YO female coming in with her daughter for sudden weakness. Her daughter went to check on her mother and she was sleeping in chair (not normal) and the daughter found that no food had been eaten since last visit 1 day ago and she did not take medicines. She was then unable to help her mother up. Her legs would not support her. She got her some help to get her up and she had to go to orthopedic appointment for broken scaphoid in wrist from recent fall. This has been hurting her. They deny new medications although she was given some pain medication in the ER. She does have history of dementia with some changes. Last CT head August with moderate atrophy. She was some sleepy also on Monday when she found her mom like this. They went to the visit and she was able to eat and drink some. Throughout the day she did improve clinically. She was able to stand on her own that evening and walk some. Her daughter is currently staying with her and taking this week off work. She has some major concerns about her mom's safety at home and feels like her mom either needs to go somewhere else or they need to get someone to stay with her at least some of the time. Her daughter is not sure if she might have a urine infection as her mom gets these sometimes. She is not quite back to normal today but better than yesterday. Symptoms noticed Monday but last normal was Sunday. ER visit with pain medication was 1 day prior to last normal.   PMH, [facility], social history reviewed and updated   Review of Systems  Unable to perform ROS: Mental status change  Constitutional: Positive for activity change, appetite change and fatigue. Negative for chills, fever and unexpected weight change.  Respiratory: Negative.   Cardiovascular: Negative.   Gastrointestinal: Negative.   Musculoskeletal: Positive for arthralgias  and myalgias.  Skin: Negative.   Neurological: Positive for speech difficulty and weakness. Negative for dizziness, seizures, light-headedness and numbness.  Psychiatric/Behavioral: Positive for confusion and decreased concentration.      Objective:   Physical Exam  Constitutional: She appears well-developed and well-nourished.  HENT:  Head: Normocephalic and atraumatic.  Eyes: EOM are normal.  Neck: Normal range of motion.  Cardiovascular: Normal rate.  irreg   Pulmonary/Chest: Effort normal and breath sounds normal. No respiratory distress. She has no wheezes. She has no rales.  Abdominal: Soft. Bowel sounds are normal. She exhibits no distension. There is no tenderness. There is no rebound.  Musculoskeletal: She exhibits tenderness. She exhibits no edema.  Slight edema but no significant edema in lower extremities. Right wrist in brace.  Neurological: She is alert. Coordination abnormal.  Weakness diffuse, can follow 1 step commands and answer simple questions  Skin: Skin is warm and dry.  Psychiatric: She has a normal mood and affect.   Vitals:   09/02/18 1050  BP: 118/80  Pulse: 97  Temp: 97.9 F (36.6 C)  TempSrc: Axillary  SpO2: 98%  Weight: 221 lb (100.2 kg)  Height: 6' (1.829 m)      Assessment & Plan:

## 2018-09-03 NOTE — Assessment & Plan Note (Signed)
Concern for acute stroke. She is outside the window for tPA and her daughter would like to avoid ER visit as they have been a lot recently. Will get CT head and evaluate if there are any changes. Another possibility could be no nutrition or liquids to cause weakness as symptoms did improve with nutrition later in the day. Regardless of etiology she will need either ALF level of care at least or care at home. She is not currently able to take care of herself and should not be left alone for long periods of time.

## 2018-09-03 NOTE — Assessment & Plan Note (Signed)
Some new confusion and weakness and need to rule out UTI. She was not able to urinate at the office so sent home with collection to check for infection.

## 2018-09-03 NOTE — Progress Notes (Signed)
Remote pacemaker transmission.   

## 2018-09-04 ENCOUNTER — Telehealth: Payer: Self-pay

## 2018-09-04 ENCOUNTER — Ambulatory Visit: Payer: Medicare HMO | Admitting: Internal Medicine

## 2018-09-04 LAB — URINE CULTURE
MICRO NUMBER:: 91457887
SPECIMEN QUALITY:: ADEQUATE

## 2018-09-04 NOTE — Telephone Encounter (Signed)
LVM with MD Response

## 2018-09-04 NOTE — Telephone Encounter (Signed)
Copied from [place] [id number]. Topic: Quick Communication - Rx Refill/Question >> Sep 04, 2018  8:29 AM Scherrie Gerlach wrote: Medication: ciprofloxacin (CIPRO) 500 MG tablet Daughter called concerning the abx ciprofloxacin (CIPRO) 500 MG tablet the dr prescribed for the pt on 12/5.  After she picked Rx up yesterday and reading the warning signs and in cert, she does not feel pt should take this med with her other health issues. She would like a call back to discuss if dr feels this is safe for the pt, and/or prescribe another Rx

## 2018-09-04 NOTE — Telephone Encounter (Signed)
This is safe for patient and would recommend taking. Also if condition is same does not likely need visit today again.

## 2018-09-05 ENCOUNTER — Other Ambulatory Visit: Payer: Self-pay | Admitting: Internal Medicine

## 2018-09-07 ENCOUNTER — Telehealth: Payer: Self-pay

## 2018-09-07 NOTE — Telephone Encounter (Signed)
Daughter of pt called said pt had uti put on ciprofloxacin for five days at 500mg  started 12/6. [place] scheduled appt for 12/10 @ 3:45pm

## 2018-09-08 ENCOUNTER — Encounter: Payer: Self-pay | Admitting: Cardiology

## 2018-09-08 ENCOUNTER — Ambulatory Visit (INDEPENDENT_AMBULATORY_CARE_PROVIDER_SITE_OTHER): Payer: Medicare HMO | Admitting: *Deleted

## 2018-09-08 DIAGNOSIS — Z5181 Encounter for therapeutic drug level monitoring: Secondary | ICD-10-CM

## 2018-09-08 DIAGNOSIS — Z86711 Personal history of pulmonary embolism: Secondary | ICD-10-CM | POA: Diagnosis not present

## 2018-09-08 LAB — POCT INR: INR: 4.8 — AB (ref 2.0–3.0)

## 2018-09-08 NOTE — Patient Instructions (Addendum)
Description   Do not take Coumadin tomorrow and No Coumadin on Thursday then continue taking normal dose of Coumadin 1 tablet (2.5mg ) daily except 1/2 tablet (1.25mg ) Tuesdays, Thursdays, and Saturdays.  Keep dark greens consistent at 4 times a week on Tuesday, Thursday, Saturday, and Sunday.  Recheck INR in 1 week.  Call if needed any new medications or if scheduled for any procedures  any concerns  [phone]

## 2018-09-08 NOTE — Progress Notes (Signed)
Letter  

## 2018-09-11 ENCOUNTER — Other Ambulatory Visit: Payer: Medicare HMO

## 2018-09-11 ENCOUNTER — Other Ambulatory Visit: Payer: Self-pay | Admitting: Internal Medicine

## 2018-09-14 ENCOUNTER — Other Ambulatory Visit: Payer: Self-pay | Admitting: Internal Medicine

## 2018-09-14 MED ORDER — METOPROLOL SUCCINATE ER 25 MG PO TB24: 75.0000 mg | ORAL_TABLET | Freq: Every day | ORAL | 1 refills | Status: DC

## 2018-09-16 ENCOUNTER — Other Ambulatory Visit: Payer: Self-pay | Admitting: Neurology

## 2018-09-16 ENCOUNTER — Encounter: Payer: Self-pay | Admitting: Internal Medicine

## 2018-09-16 MED ORDER — DULOXETINE HCL 20 MG PO CPEP: 20.0000 mg | ORAL_CAPSULE | Freq: Every day | ORAL | 1 refills | Status: DC

## 2018-09-17 ENCOUNTER — Encounter: Payer: Self-pay | Admitting: Internal Medicine

## 2018-09-18 ENCOUNTER — Ambulatory Visit (INDEPENDENT_AMBULATORY_CARE_PROVIDER_SITE_OTHER): Payer: Medicare HMO

## 2018-09-18 DIAGNOSIS — Z86711 Personal history of pulmonary embolism: Secondary | ICD-10-CM

## 2018-09-18 DIAGNOSIS — Z5181 Encounter for therapeutic drug level monitoring: Secondary | ICD-10-CM | POA: Diagnosis not present

## 2018-09-18 LAB — CBC
Hematocrit: 41 % (ref 34.0–46.6)
Hemoglobin: 13.5 g/dL (ref 11.1–15.9)
MCH: 27.8 pg (ref 26.6–33.0)
MCHC: 32.9 g/dL (ref 31.5–35.7)
MCV: 84 fL (ref 79–97)
Platelets: 308 10*3/uL (ref 150–450)
RBC: 4.86 x10E6/uL (ref 3.77–5.28)
RDW: 19.2 % — ABNORMAL HIGH (ref 12.3–15.4)
WBC: 2.8 10*3/uL — ABNORMAL LOW (ref 3.4–10.8)

## 2018-09-18 LAB — PROTIME-INR
INR: >10 (ref 0.8–1.2)
Prothrombin Time: >120 s — ABNORMAL HIGH (ref 9.1–12.0)

## 2018-09-18 LAB — POCT INR: INR: 8 — AB (ref 2.0–3.0)

## 2018-09-18 NOTE — Patient Instructions (Addendum)
Description   Hold your Coumadin, pt has already taken today's dosage of Coumadin.  Sent for STAT lab.  Go to the ED with any abnormal bleeding.  Increase green leafy vegetables over the weekend.   Hold Coumadin until recheck on Tuesday.  Call if needed any new medications or if scheduled for any procedures  any concerns  [phone]

## 2018-09-22 ENCOUNTER — Ambulatory Visit (INDEPENDENT_AMBULATORY_CARE_PROVIDER_SITE_OTHER): Payer: Medicare HMO | Admitting: *Deleted

## 2018-09-22 DIAGNOSIS — Z5181 Encounter for therapeutic drug level monitoring: Secondary | ICD-10-CM

## 2018-09-22 DIAGNOSIS — Z86711 Personal history of pulmonary embolism: Secondary | ICD-10-CM | POA: Diagnosis not present

## 2018-09-22 LAB — POCT INR: INR: 3.9 — AB (ref 2.0–3.0)

## 2018-09-22 NOTE — Patient Instructions (Signed)
Description   Do not take any Coumadin today then start taking 1/2 tablet daily except 1 tablet on Monday, Wednesday, and Friday. Recheck INR in 1 week. Call if needed any new medications or if scheduled for any procedures  any concerns  [phone]

## 2018-09-25 ENCOUNTER — Telehealth: Payer: Self-pay | Admitting: Internal Medicine

## 2018-09-25 NOTE — Telephone Encounter (Signed)
Patient's daughter Artemio Aly) dropped off FMLA papers to be filled out by Dr Sharlet Salina. Please call Ruzie when completed.  Placed in Brittany's box.

## 2018-09-28 NOTE — Telephone Encounter (Signed)
Are you okay with this renewal being completed. It looks like there is a another TE for mychart the patient sent you. Please advise if you want me to complete. Thank you.

## 2018-10-02 ENCOUNTER — Ambulatory Visit (INDEPENDENT_AMBULATORY_CARE_PROVIDER_SITE_OTHER): Payer: Medicare HMO

## 2018-10-02 DIAGNOSIS — Z5181 Encounter for therapeutic drug level monitoring: Secondary | ICD-10-CM

## 2018-10-02 DIAGNOSIS — Z86711 Personal history of pulmonary embolism: Secondary | ICD-10-CM | POA: Diagnosis not present

## 2018-10-02 LAB — POCT INR: INR: 4.2 — AB (ref 2.0–3.0)

## 2018-10-02 NOTE — Telephone Encounter (Signed)
Forms have been completed & signed, Faxed to [facility] @ [phone], Copy sent to scan & charged for under daughter.   Daughter [name] has been informed and original mailed to her. )

## 2018-10-02 NOTE — Patient Instructions (Signed)
Description   Skip today and tomorrow's dosage of Coumadin, then start taking 1/2 tablet daily except 1 tablet on Mondays and Fridays. Recheck INR in 2 weeks. Call if needed any new medications or if scheduled for any procedures  any concerns  [phone]

## 2018-10-16 ENCOUNTER — Ambulatory Visit (INDEPENDENT_AMBULATORY_CARE_PROVIDER_SITE_OTHER): Payer: Medicare HMO | Admitting: *Deleted

## 2018-10-16 DIAGNOSIS — Z86711 Personal history of pulmonary embolism: Secondary | ICD-10-CM

## 2018-10-16 DIAGNOSIS — Z5181 Encounter for therapeutic drug level monitoring: Secondary | ICD-10-CM

## 2018-10-16 LAB — POCT INR: INR: 3 (ref 2.0–3.0)

## 2018-10-16 NOTE — Patient Instructions (Addendum)
Description   Start taking 1/2 tablet daily except 1 tablet on Mondays. Recheck INR in 2 weeks. Call if needed any new medications or if scheduled for any procedures  any concerns  [phone]

## 2018-10-18 ENCOUNTER — Emergency Department (HOSPITAL_COMMUNITY): Payer: Medicare HMO

## 2018-10-18 ENCOUNTER — Inpatient Hospital Stay (HOSPITAL_COMMUNITY)
Admission: EM | Admit: 2018-10-18 | Discharge: 2018-10-27 | DRG: 683 | Disposition: A | Discharge status: Skilled Nursing Facility | Payer: Medicare HMO | Attending: Family Medicine | Admitting: Family Medicine

## 2018-10-18 ENCOUNTER — Inpatient Hospital Stay (HOSPITAL_COMMUNITY): Payer: Medicare HMO

## 2018-10-18 ENCOUNTER — Other Ambulatory Visit: Payer: Self-pay

## 2018-10-18 ENCOUNTER — Encounter (HOSPITAL_COMMUNITY): Payer: Self-pay | Admitting: Emergency Medicine

## 2018-10-18 DIAGNOSIS — E1122 Type 2 diabetes mellitus with diabetic chronic kidney disease: Secondary | ICD-10-CM | POA: Diagnosis not present

## 2018-10-18 DIAGNOSIS — I4891 Unspecified atrial fibrillation: Secondary | ICD-10-CM | POA: Diagnosis present

## 2018-10-18 DIAGNOSIS — Z853 Personal history of malignant neoplasm of breast: Secondary | ICD-10-CM

## 2018-10-18 DIAGNOSIS — Z87891 Personal history of nicotine dependence: Secondary | ICD-10-CM

## 2018-10-18 DIAGNOSIS — I4821 Permanent atrial fibrillation: Secondary | ICD-10-CM | POA: Diagnosis not present

## 2018-10-18 DIAGNOSIS — R911 Solitary pulmonary nodule: Secondary | ICD-10-CM | POA: Diagnosis present

## 2018-10-18 DIAGNOSIS — E1165 Type 2 diabetes mellitus with hyperglycemia: Secondary | ICD-10-CM | POA: Diagnosis not present

## 2018-10-18 DIAGNOSIS — Y92009 Unspecified place in unspecified non-institutional (private) residence as the place of occurrence of the external cause: Secondary | ICD-10-CM

## 2018-10-18 DIAGNOSIS — W19XXXA Unspecified fall, initial encounter: Secondary | ICD-10-CM | POA: Diagnosis not present

## 2018-10-18 DIAGNOSIS — Z91018 Allergy to other foods: Secondary | ICD-10-CM

## 2018-10-18 DIAGNOSIS — Z8249 Family history of ischemic heart disease and other diseases of the circulatory system: Secondary | ICD-10-CM

## 2018-10-18 DIAGNOSIS — E860 Dehydration: Secondary | ICD-10-CM | POA: Diagnosis present

## 2018-10-18 DIAGNOSIS — E1142 Type 2 diabetes mellitus with diabetic polyneuropathy: Secondary | ICD-10-CM | POA: Diagnosis present

## 2018-10-18 DIAGNOSIS — N184 Chronic kidney disease, stage 4 (severe): Secondary | ICD-10-CM | POA: Diagnosis not present

## 2018-10-18 DIAGNOSIS — E119 Type 2 diabetes mellitus without complications: Secondary | ICD-10-CM | POA: Diagnosis not present

## 2018-10-18 DIAGNOSIS — R278 Other lack of coordination: Secondary | ICD-10-CM | POA: Diagnosis not present

## 2018-10-18 DIAGNOSIS — R590 Localized enlarged lymph nodes: Secondary | ICD-10-CM | POA: Diagnosis present

## 2018-10-18 DIAGNOSIS — I272 Pulmonary hypertension, unspecified: Secondary | ICD-10-CM | POA: Diagnosis present

## 2018-10-18 DIAGNOSIS — K449 Diaphragmatic hernia without obstruction or gangrene: Secondary | ICD-10-CM | POA: Diagnosis present

## 2018-10-18 DIAGNOSIS — N179 Acute kidney failure, unspecified: Principal | ICD-10-CM | POA: Diagnosis present

## 2018-10-18 DIAGNOSIS — R41841 Cognitive communication deficit: Secondary | ICD-10-CM | POA: Diagnosis not present

## 2018-10-18 DIAGNOSIS — R4189 Other symptoms and signs involving cognitive functions and awareness: Secondary | ICD-10-CM

## 2018-10-18 DIAGNOSIS — R5381 Other malaise: Secondary | ICD-10-CM | POA: Diagnosis not present

## 2018-10-18 DIAGNOSIS — Z825 Family history of asthma and other chronic lower respiratory diseases: Secondary | ICD-10-CM

## 2018-10-18 DIAGNOSIS — L89896 Pressure-induced deep tissue damage of other site: Secondary | ICD-10-CM | POA: Diagnosis not present

## 2018-10-18 DIAGNOSIS — L89326 Pressure-induced deep tissue damage of left buttock: Secondary | ICD-10-CM | POA: Diagnosis not present

## 2018-10-18 DIAGNOSIS — R0609 Other forms of dyspnea: Secondary | ICD-10-CM | POA: Diagnosis not present

## 2018-10-18 DIAGNOSIS — S0990XA Unspecified injury of head, initial encounter: Secondary | ICD-10-CM | POA: Diagnosis not present

## 2018-10-18 DIAGNOSIS — I5023 Acute on chronic systolic (congestive) heart failure: Secondary | ICD-10-CM | POA: Diagnosis not present

## 2018-10-18 DIAGNOSIS — Z7984 Long term (current) use of oral hypoglycemic drugs: Secondary | ICD-10-CM

## 2018-10-18 DIAGNOSIS — N390 Urinary tract infection, site not specified: Secondary | ICD-10-CM | POA: Diagnosis not present

## 2018-10-18 DIAGNOSIS — Z950 Presence of cardiac pacemaker: Secondary | ICD-10-CM

## 2018-10-18 DIAGNOSIS — Z888 Allergy status to other drugs, medicaments and biological substances status: Secondary | ICD-10-CM

## 2018-10-18 DIAGNOSIS — I130 Hypertensive heart and chronic kidney disease with heart failure and stage 1 through stage 4 chronic kidney disease, or unspecified chronic kidney disease: Secondary | ICD-10-CM | POA: Diagnosis present

## 2018-10-18 DIAGNOSIS — R55 Syncope and collapse: Secondary | ICD-10-CM | POA: Diagnosis not present

## 2018-10-18 DIAGNOSIS — I872 Venous insufficiency (chronic) (peripheral): Secondary | ICD-10-CM | POA: Diagnosis present

## 2018-10-18 DIAGNOSIS — I5042 Chronic combined systolic (congestive) and diastolic (congestive) heart failure: Secondary | ICD-10-CM | POA: Diagnosis not present

## 2018-10-18 DIAGNOSIS — Z7409 Other reduced mobility: Secondary | ICD-10-CM | POA: Diagnosis not present

## 2018-10-18 DIAGNOSIS — Z86711 Personal history of pulmonary embolism: Secondary | ICD-10-CM | POA: Diagnosis present

## 2018-10-18 DIAGNOSIS — F419 Anxiety disorder, unspecified: Secondary | ICD-10-CM | POA: Diagnosis present

## 2018-10-18 DIAGNOSIS — R52 Pain, unspecified: Secondary | ICD-10-CM | POA: Diagnosis not present

## 2018-10-18 DIAGNOSIS — R69 Illness, unspecified: Secondary | ICD-10-CM | POA: Diagnosis not present

## 2018-10-18 DIAGNOSIS — G8929 Other chronic pain: Secondary | ICD-10-CM | POA: Diagnosis present

## 2018-10-18 DIAGNOSIS — Z90710 Acquired absence of both cervix and uterus: Secondary | ICD-10-CM

## 2018-10-18 DIAGNOSIS — R0600 Dyspnea, unspecified: Secondary | ICD-10-CM | POA: Diagnosis not present

## 2018-10-18 DIAGNOSIS — Z515 Encounter for palliative care: Secondary | ICD-10-CM

## 2018-10-18 DIAGNOSIS — F0390 Unspecified dementia without behavioral disturbance: Secondary | ICD-10-CM | POA: Diagnosis present

## 2018-10-18 DIAGNOSIS — L8990 Pressure ulcer of unspecified site, unspecified stage: Secondary | ICD-10-CM

## 2018-10-18 DIAGNOSIS — Z7901 Long term (current) use of anticoagulants: Secondary | ICD-10-CM

## 2018-10-18 DIAGNOSIS — E042 Nontoxic multinodular goiter: Secondary | ICD-10-CM | POA: Diagnosis present

## 2018-10-18 DIAGNOSIS — R1312 Dysphagia, oropharyngeal phase: Secondary | ICD-10-CM | POA: Diagnosis not present

## 2018-10-18 DIAGNOSIS — I482 Chronic atrial fibrillation, unspecified: Secondary | ICD-10-CM | POA: Diagnosis not present

## 2018-10-18 DIAGNOSIS — I340 Nonrheumatic mitral (valve) insufficiency: Secondary | ICD-10-CM | POA: Diagnosis not present

## 2018-10-18 DIAGNOSIS — R000 Tachycardia, unspecified: Secondary | ICD-10-CM | POA: Diagnosis not present

## 2018-10-18 DIAGNOSIS — Z7401 Bed confinement status: Secondary | ICD-10-CM | POA: Diagnosis not present

## 2018-10-18 DIAGNOSIS — Z883 Allergy status to other anti-infective agents status: Secondary | ICD-10-CM

## 2018-10-18 DIAGNOSIS — I10 Essential (primary) hypertension: Secondary | ICD-10-CM | POA: Diagnosis not present

## 2018-10-18 DIAGNOSIS — I081 Rheumatic disorders of both mitral and tricuspid valves: Secondary | ICD-10-CM | POA: Diagnosis present

## 2018-10-18 DIAGNOSIS — I2699 Other pulmonary embolism without acute cor pulmonale: Secondary | ICD-10-CM | POA: Diagnosis not present

## 2018-10-18 DIAGNOSIS — S199XXA Unspecified injury of neck, initial encounter: Secondary | ICD-10-CM | POA: Diagnosis not present

## 2018-10-18 DIAGNOSIS — M6282 Rhabdomyolysis: Secondary | ICD-10-CM | POA: Diagnosis not present

## 2018-10-18 DIAGNOSIS — S299XXA Unspecified injury of thorax, initial encounter: Secondary | ICD-10-CM | POA: Diagnosis not present

## 2018-10-18 DIAGNOSIS — M542 Cervicalgia: Secondary | ICD-10-CM | POA: Diagnosis not present

## 2018-10-18 DIAGNOSIS — Z881 Allergy status to other antibiotic agents status: Secondary | ICD-10-CM

## 2018-10-18 DIAGNOSIS — M2550 Pain in unspecified joint: Secondary | ICD-10-CM | POA: Diagnosis not present

## 2018-10-18 DIAGNOSIS — L89316 Pressure-induced deep tissue damage of right buttock: Secondary | ICD-10-CM | POA: Diagnosis not present

## 2018-10-18 DIAGNOSIS — I4811 Longstanding persistent atrial fibrillation: Secondary | ICD-10-CM | POA: Diagnosis not present

## 2018-10-18 DIAGNOSIS — Z885 Allergy status to narcotic agent status: Secondary | ICD-10-CM

## 2018-10-18 DIAGNOSIS — B372 Candidiasis of skin and nail: Secondary | ICD-10-CM | POA: Diagnosis present

## 2018-10-18 DIAGNOSIS — I5032 Chronic diastolic (congestive) heart failure: Secondary | ICD-10-CM | POA: Diagnosis present

## 2018-10-18 DIAGNOSIS — R791 Abnormal coagulation profile: Secondary | ICD-10-CM | POA: Diagnosis present

## 2018-10-18 DIAGNOSIS — Z7189 Other specified counseling: Secondary | ICD-10-CM

## 2018-10-18 DIAGNOSIS — Z9012 Acquired absence of left breast and nipple: Secondary | ICD-10-CM

## 2018-10-18 DIAGNOSIS — I495 Sick sinus syndrome: Secondary | ICD-10-CM | POA: Diagnosis present

## 2018-10-18 DIAGNOSIS — Z79899 Other long term (current) drug therapy: Secondary | ICD-10-CM

## 2018-10-18 DIAGNOSIS — M6281 Muscle weakness (generalized): Secondary | ICD-10-CM | POA: Diagnosis not present

## 2018-10-18 DIAGNOSIS — Z86010 Personal history of colonic polyps: Secondary | ICD-10-CM

## 2018-10-18 LAB — CUP PACEART REMOTE DEVICE CHECK
Battery Impedance: 1589 Ohm
Battery Remaining Longevity: 40 mo
Battery Voltage: 2.75 V
Brady Statistic AP VP Percent: 3 %
Brady Statistic AP VS Percent: 1 %
Brady Statistic AS VP Percent: 3 %
Brady Statistic AS VS Percent: 93 %
Date Time Interrogation Session: 20191204191421
Implantable Lead Implant Date: 20101001
Implantable Lead Implant Date: 20101001
Implantable Lead Location: 753859
Implantable Lead Location: 753860
Implantable Lead Model: 5076
Implantable Lead Model: 5076
Implantable Pulse Generator Implant Date: 20101001
Lead Channel Impedance Value: 393 Ohm
Lead Channel Impedance Value: 400 Ohm
Lead Channel Pacing Threshold Amplitude: 0.5 V
Lead Channel Pacing Threshold Pulse Width: 0.4 ms
Lead Channel Pacing Threshold Pulse Width: 0.4 ms
Lead Channel Setting Pacing Amplitude: 2 V
Lead Channel Setting Pacing Amplitude: 2.5 V
Lead Channel Setting Pacing Pulse Width: 1 ms
MDC IDC MSMT LEADCHNL RV PACING THRESHOLD AMPLITUDE: 1.25 V
MDC IDC SET LEADCHNL RV SENSING SENSITIVITY: 2.8 mV

## 2018-10-18 LAB — COMPREHENSIVE METABOLIC PANEL
ALT: 26 U/L (ref 0–44)
AST: 44 U/L — ABNORMAL HIGH (ref 15–41)
Albumin: 2.6 g/dL — ABNORMAL LOW (ref 3.5–5.0)
Alkaline Phosphatase: 123 U/L (ref 38–126)
Anion gap: 14 (ref 5–15)
BUN: 40 mg/dL — ABNORMAL HIGH (ref 8–23)
CO2: 23 mmol/L (ref 22–32)
Calcium: 9.3 mg/dL (ref 8.9–10.3)
Chloride: 98 mmol/L (ref 98–111)
Creatinine, Ser: 2.52 mg/dL — ABNORMAL HIGH (ref 0.44–1.00)
GFR calc Af Amer: 19 mL/min — ABNORMAL LOW (ref >60)
GFR calc non Af Amer: 17 mL/min — ABNORMAL LOW (ref >60)
Glucose, Bld: 195 mg/dL — ABNORMAL HIGH (ref 70–99)
Potassium: 4.3 mmol/L (ref 3.5–5.1)
Sodium: 135 mmol/L (ref 135–145)
Total Bilirubin: 3.1 mg/dL — ABNORMAL HIGH (ref 0.3–1.2)
Total Protein: 7.7 g/dL (ref 6.5–8.1)

## 2018-10-18 LAB — URINALYSIS, ROUTINE W REFLEX MICROSCOPIC
Bilirubin Urine: NEGATIVE
Glucose, UA: NEGATIVE mg/dL
Ketones, ur: NEGATIVE mg/dL
Nitrite: NEGATIVE
Protein, ur: 30 mg/dL — AB
RBC / HPF: >50 RBC/hpf — ABNORMAL HIGH (ref 0–5)
Specific Gravity, Urine: 1.011 (ref 1.005–1.030)
WBC, UA: >50 WBC/hpf — ABNORMAL HIGH (ref 0–5)
pH: 5 (ref 5.0–8.0)

## 2018-10-18 LAB — CBC WITH DIFFERENTIAL/PLATELET
Abs Immature Granulocytes: 0.04 10*3/uL (ref 0.00–0.07)
Basophils Absolute: 0 10*3/uL (ref 0.0–0.1)
Basophils Relative: 0 %
Eosinophils Absolute: 0 10*3/uL (ref 0.0–0.5)
Eosinophils Relative: 0 %
HCT: 46.9 % — ABNORMAL HIGH (ref 36.0–46.0)
Hemoglobin: 14.5 g/dL (ref 12.0–15.0)
Immature Granulocytes: 0 %
Lymphocytes Relative: 4 %
Lymphs Abs: 0.4 10*3/uL — ABNORMAL LOW (ref 0.7–4.0)
MCH: 27 pg (ref 26.0–34.0)
MCHC: 30.9 g/dL (ref 30.0–36.0)
MCV: 87.2 fL (ref 80.0–100.0)
Monocytes Absolute: 0.6 10*3/uL (ref 0.1–1.0)
Monocytes Relative: 7 %
Neutro Abs: 8 10*3/uL — ABNORMAL HIGH (ref 1.7–7.7)
Neutrophils Relative %: 89 %
Platelets: 351 10*3/uL (ref 150–400)
RBC: 5.38 MIL/uL — ABNORMAL HIGH (ref 3.87–5.11)
RDW: 18.7 % — ABNORMAL HIGH (ref 11.5–15.5)
WBC: 9.1 10*3/uL (ref 4.0–10.5)
nRBC: 0 % (ref 0.0–0.2)

## 2018-10-18 LAB — I-STAT CHEM 8, ED
BUN: 39 mg/dL — ABNORMAL HIGH (ref 8–23)
Calcium, Ion: 1.14 mmol/L — ABNORMAL LOW (ref 1.15–1.40)
Chloride: 101 mmol/L (ref 98–111)
Creatinine, Ser: 2.3 mg/dL — ABNORMAL HIGH (ref 0.44–1.00)
Glucose, Bld: 192 mg/dL — ABNORMAL HIGH (ref 70–99)
HCT: 49 % — ABNORMAL HIGH (ref 36.0–46.0)
Hemoglobin: 16.7 g/dL — ABNORMAL HIGH (ref 12.0–15.0)
Potassium: 4.2 mmol/L (ref 3.5–5.1)
Sodium: 137 mmol/L (ref 135–145)
TCO2: 24 mmol/L (ref 22–32)

## 2018-10-18 LAB — I-STAT ARTERIAL BLOOD GAS, ED
Bicarbonate: 21.9 mmol/L (ref 20.0–28.0)
O2 SAT: 94 %
PCO2 ART: 28.3 mmHg — AB (ref 32.0–48.0)
Patient temperature: 98.6
TCO2: 23 mmol/L (ref 22–32)
pH, Arterial: 7.497 — ABNORMAL HIGH (ref 7.350–7.450)
pO2, Arterial: 62 mmHg — ABNORMAL LOW (ref 83.0–108.0)

## 2018-10-18 LAB — PROTIME-INR
INR: 2.61
Prothrombin Time: 27.5 seconds — ABNORMAL HIGH (ref 11.4–15.2)

## 2018-10-18 LAB — I-STAT TROPONIN, ED: TROPONIN I, POC: 0.05 ng/mL (ref 0.00–0.08)

## 2018-10-18 LAB — CK: CK TOTAL: 499 U/L — AB (ref 38–234)

## 2018-10-18 MED ORDER — ACETAMINOPHEN 325 MG PO TABS
650.0000 mg | ORAL_TABLET | Freq: Four times a day (QID) | ORAL | Status: DC | PRN
  Administered 2018-10-18 – 2018-10-26 (×3): 650 mg via ORAL
  Filled 2018-10-18 (×4): qty 2

## 2018-10-18 MED ORDER — SODIUM CHLORIDE 0.9% FLUSH
3.0000 mL | Freq: Two times a day (BID) | INTRAVENOUS | Status: DC
  Administered 2018-10-18 – 2018-10-27 (×15): 3 mL via INTRAVENOUS

## 2018-10-18 MED ORDER — SODIUM CHLORIDE 0.9 % IV BOLUS
500.0000 mL | Freq: Once | INTRAVENOUS | Status: AC
  Administered 2018-10-18: 500 mL via INTRAVENOUS

## 2018-10-18 MED ORDER — SODIUM CHLORIDE 0.9 % IV BOLUS
250.0000 mL | Freq: Once | INTRAVENOUS | Status: AC
  Administered 2018-10-18: 250 mL via INTRAVENOUS

## 2018-10-18 MED ORDER — SODIUM CHLORIDE 0.9 % IV SOLN
INTRAVENOUS | Status: DC
  Administered 2018-10-18 – 2018-10-20 (×5): via INTRAVENOUS

## 2018-10-18 MED ORDER — SODIUM CHLORIDE 0.9 % IV SOLN: 1.0000 g | Freq: Once | INTRAVENOUS | Status: DC

## 2018-10-18 MED ORDER — SODIUM CHLORIDE 0.9 % IV SOLN
1.0000 g | INTRAVENOUS | Status: DC
  Administered 2018-10-18 – 2018-10-19 (×2): 1 g via INTRAVENOUS
  Filled 2018-10-18 (×3): qty 10

## 2018-10-18 MED ORDER — MEMANTINE HCL 5 MG PO TABS
5.0000 mg | ORAL_TABLET | Freq: Two times a day (BID) | ORAL | Status: DC
  Administered 2018-10-18 – 2018-10-27 (×19): 5 mg via ORAL
  Filled 2018-10-18 (×20): qty 1

## 2018-10-18 MED ORDER — BETHANECHOL CHLORIDE 25 MG PO TABS
25.0000 mg | ORAL_TABLET | Freq: Two times a day (BID) | ORAL | Status: DC
  Administered 2018-10-18 – 2018-10-27 (×19): 25 mg via ORAL
  Filled 2018-10-18 (×19): qty 1

## 2018-10-18 MED ORDER — WARFARIN - PHARMACIST DOSING INPATIENT
Freq: Every day | Status: DC
  Administered 2018-10-21 – 2018-10-27 (×2)

## 2018-10-18 MED ORDER — PREGABALIN 75 MG PO CAPS
75.0000 mg | ORAL_CAPSULE | Freq: Every day | ORAL | Status: DC
  Administered 2018-10-18 – 2018-10-27 (×10): 75 mg via ORAL
  Filled 2018-10-18 (×10): qty 1

## 2018-10-18 MED ORDER — WARFARIN 1.25 MG HALF TABLET: 1.2500 mg | ORAL_TABLET | ORAL | Status: DC

## 2018-10-18 MED ORDER — SENNOSIDES-DOCUSATE SODIUM 8.6-50 MG PO TABS: 1.0000 | ORAL_TABLET | Freq: Every evening | ORAL | Status: DC | PRN

## 2018-10-18 MED ORDER — METOPROLOL TARTRATE 5 MG/5ML IV SOLN
5.0000 mg | Freq: Once | INTRAVENOUS | Status: AC
  Administered 2018-10-18: 5 mg via INTRAVENOUS
  Filled 2018-10-18: qty 5

## 2018-10-18 MED ORDER — METOPROLOL SUCCINATE ER 25 MG PO TB24
25.0000 mg | ORAL_TABLET | Freq: Every day | ORAL | Status: DC
  Administered 2018-10-18 – 2018-10-20 (×3): 25 mg via ORAL
  Filled 2018-10-18 (×3): qty 1

## 2018-10-18 MED ORDER — DILTIAZEM LOAD VIA INFUSION
10.0000 mg | Freq: Once | INTRAVENOUS | Status: AC
  Administered 2018-10-18: 10 mg via INTRAVENOUS
  Filled 2018-10-18 (×2): qty 10

## 2018-10-18 MED ORDER — VITAMIN B-12 1000 MCG PO TABS
500.0000 ug | ORAL_TABLET | Freq: Every day | ORAL | Status: DC
  Administered 2018-10-18 – 2018-10-27 (×10): 500 ug via ORAL
  Filled 2018-10-18 (×10): qty 1

## 2018-10-18 MED ORDER — DILTIAZEM HCL-DEXTROSE 100-5 MG/100ML-% IV SOLN (PREMIX)
5.0000 mg/h | INTRAVENOUS | Status: DC
  Administered 2018-10-18: 5 mg/h via INTRAVENOUS
  Filled 2018-10-18 (×2): qty 100

## 2018-10-18 MED ORDER — WARFARIN 1.25 MG HALF TABLET
1.2500 mg | ORAL_TABLET | Freq: Once | ORAL | Status: AC
  Administered 2018-10-18: 1.25 mg via ORAL
  Filled 2018-10-18: qty 1

## 2018-10-18 MED ORDER — DIMETHICONE 1 % EX CREA
TOPICAL_CREAM | Freq: Two times a day (BID) | CUTANEOUS | Status: DC | PRN
  Filled 2018-10-18: qty 113

## 2018-10-18 NOTE — Progress Notes (Signed)
Page to Dr Kyung Bacca  3e29 [place] in Afib RVR please review/advise.

## 2018-10-18 NOTE — Progress Notes (Signed)
Paged MD, no order for lopressor at this time  Pt sustaining HR 140-150s Awaiting call back

## 2018-10-18 NOTE — Progress Notes (Signed)
   10/18/18 1941  MEWS Score  Resp 19  Pulse Rate (!) 129  BP 105/80  Temp (!) 101.7 F (38.7 C)  MEWS RR 0  MEWS Pulse 2  MEWS Systolic 0  MEWS LOC 0  MEWS Temp 2  MEWS Score 4  MEWS Score Color Red  MEWS ECG HR/ Pulse 129  Pt's HR sustained in the 120's-150's. Pt is asymptomatic. Bodenheimer NP made aware, orders made.

## 2018-10-18 NOTE — H&P (Addendum)
History and Physical  [name] [name] HER:[id number] DOB: [birth year] DOA: 10/18/2018  Referring physician: Dr. Quintella Reichert PCP: Hoyt Koch, MD  Outpatient Specialists: None Patient coming from: Home & is able to ambulate   Chief Complaint: Fall  HPI: [name] [name] is a 85 y.o. female with medical history significant for atrial fibrillation on Coumadin dementia, history of breast cancer status post left mastectomy pacemaker who was brought in by family member her daughter and son due to fall.  The patient lives alone at home and she was last seen last night by her daughter and then apparently she got up to the bathroom during the night and fell this morning she was found between her bed and nightstand is unknown how long she had been there the family stated that it was difficult to move her patient denies any pain but complaining of pain and swelling in the right left eye from where she was wedged between the nightstand and the bed.  Family is complaining that she has been progressively weak over the past 3 weeks with dyspnea on exertion they feel like she might need rehabilitation and advised her that since she lives alone with increased dementia the rehab would not really do anything they might need to consider assisted living or nursing home placement in any case  physical therapy will be ordered for evaluation.  Also the complaint of increased confusion especially lately and that whenever she falls that she usually has a urinary tract infection, family is also concerned about oxygen being low with shortness of breath and wanted to know if she would go home on oxygen they were advised that her oxygen is between 88 and 100 off-and-on and that ABG will be ordered and if she meets criteria she will be able to go home with oxygen patient lives alone but her family comes over to give her her medication she was found to be severely dehydrated in the ED and received 2 boluses of normal  saline total of the first 1 was 500 in the ED and the second was 250 on the floor because she continued to be tachycardic   ED Course: IV fluids ,metoprolol  Review of Systems: Patient seen Fen/Phen and examined. Pt complains of left temporal headache and pain from contusion and fall  Pt denies any chest pain fever diarrhea.  Review of systems are otherwise negative   Past Medical History:  Diagnosis Date  . Acute gastric ulcer without mention of hemorrhage, perforation, or obstruction 2006   EGD  . Anxiety   . Atrial fibrillation (HCC)    flecainide; coumadin  . Breast cancer ([place])    left  . Carotid artery disease ([place])    85/63: RICA 1-49%, LICA 70-26%  //  [place] 37/85: RICA 8-85%; LICA 02-77% >> FU 1 year   . Chronic diastolic heart failure (HCC)    a. echo 10/10: mild LVH, EF 55-60%, mild AI, mild MR, mild to mod LAE, mild RVE, severe RAE, mod TR, PASP 53  /  b.  Echo 3/14:  Mild LVH, EF 55%, Tr AI, MAC, mild MR, mod LAE, PASP 31, trivial eff. // c. echo 1/17: EF 55-60%, mild AI, MAC, mild MR, moderate BAE, moderate TR  . Chronic lower back pain   . Diabetic peripheral neuropathy ([place]) 03/09/2015  . Diverticulosis   . Esophagitis, unspecified 2006   EGD  . Hiatal hernia 2006   EGD  . History of nuclear stress test    a.  Myoview 2/12: EF 69%, no scar or ischemia  . Hx of adenomatous colonic polyps   . Hypertension   . Mediastinal lymphadenopathy   . Memory difficulty 11/14/2016  . Mitral regurgitation    mild, echo, October, 2010  . Osteopenia   . Presence of permanent cardiac pacemaker   . Pulmonary embolus [facility]) 2006   2006, with DVT  . Pulmonary HTN ([place])    53 mmHg echo, 2010, moderate TR, mild right ventricular enlargement  . Tachy-brady syndrome ([place])    s/p pacer 07/2009  . Thyroid nodule    non-neoplastic goiter  . Type II diabetes mellitus ([place])   . Venous insufficiency    Chronic   Past Surgical History:  Procedure Laterality Date  . ABDOMINAL  HYSTERECTOMY    . ANKLE FRACTURE SURGERY Right   . BACK SURGERY    . BREAST LUMPECTOMY Left   . FRACTURE SURGERY    . INSERT / REPLACE / REMOVE PACEMAKER  06/30/2009   MDT Adalpta L implanted by Dr Olevia Perches  . LUMBAR LAMINECTOMY  1973, 06/22/2011   "no hardware either time"    Social History:  reports that she quit smoking about 19 years ago. Her smoking use included cigarettes. She has a 47.00 pack-year smoking history. She has never used smokeless tobacco. She reports that she does not drink alcohol or use drugs.   Allergies  Allergen Reactions  . Amiodarone Shortness Of Breath and Nausea Only    "Toxicity," also  . Flecainide Shortness Of Breath and Other (See Comments)    "Toxicity," also   . Morphine And Related Nausea And Vomiting and Other (See Comments)    Family requests no morphine due to past reaction. Pt stated it made her sick, severe nausea and vomiting  . Bactrim Ds [Sulfamethoxazole-Trimethoprim] Diarrhea  . Cephalexin Nausea And Vomiting  . Pineapple Itching    Severe itching    Family History  Problem Relation Age of Onset  . Heart disease Mother   . Hypertension Mother   . COPD Father   . Hypertension Father   . Hypertension Sister   . Hypertension Brother   . Hypertension Daughter   . Colon cancer Neg Hx   . Heart attack Neg Hx   . Stroke Neg Hx       Prior to Admission medications   Medication Sig Start Date End Date Taking? Authorizing Provider  acetaminophen (TYLENOL) 325 MG tablet Take 650 mg by mouth every 6 (six) hours as needed (back pain).    Yes [provider]  bethanechol (URECHOLINE) 25 MG tablet Take 25 mg by mouth 2 (two) times daily.   Yes [provider]  furosemide (LASIX) 80 MG tablet TAKE 1 TABLET (80 MG TOTAL) BY MOUTH 2 (TWO) TIMES DAILY. 01/13/18  Yes Isaiah Serge, NP  memantine (NAMENDA) 5 MG tablet Take 1 tablet (5 mg total) by mouth 2 (two) times daily. 07/23/18  Yes Kathrynn Ducking, MD  metoprolol  succinate (TOPROL-XL) 25 MG 24 hr tablet Take 3 tablets (75 mg total) by mouth daily. 09/14/18  Yes Hoyt Koch, MD  potassium chloride 20 MEQ/15ML (10%) SOLN Take 20 mEq by mouth daily as needed (if have not eaten a banana).   Yes [provider]  pregabalin (LYRICA) 75 MG capsule Take 1 capsule (75 mg total) by mouth at bedtime. 08/12/18  Yes Kathrynn Ducking, MD  promethazine (PHENERGAN) 25 MG tablet Take 1 tablet (25 mg total) by mouth every  8 (eight) hours as needed for nausea or vomiting. 10/22/16  Yes Hoyt Koch, MD  vitamin B-12 (CYANOCOBALAMIN) 500 MCG tablet Take 500 mcg by mouth daily.    Yes [provider]  warfarin (COUMADIN) 2.5 MG tablet TAKE 1/2 TABLET TO 1 TABLET DAILY OR AS DIRECTED BY COUMADIN CLINIC Patient taking differently: Take 1.25-2.5 mg by mouth See admin instructions. Take 1.25 mg tablet Tues, Thurs and Sat  2.5 mg Mon, Wed, Fri and Sun 06/30/18  Yes Dorothy Spark, MD  blood glucose meter kit and supplies KIT Dispense based on patient and insurance preference. Use before breakfast and at bedtime. Diagnosis code: E11.9 03/26/17   NcheCharlene Brooke, NP  triamcinolone cream (KENALOG) 0.1 % Apply 1 application topically 2 (two) times daily. Patient not taking: Reported on 10/18/2018 05/08/18   Hoyt Koch, MD    Physical Exam: BP 113/87   Pulse (!) 113   Temp 98.4 F (36.9 C) (Oral)   Resp 19   Ht 6' (1.829 m)   Wt 95.7 kg   SpO2 98%   BMI 28.62 kg/m   Exam:  . General: 85 y.o. year-old female well developed well nourished in no acute distress.  Alert and oriented x3. . Cardiovascular: Regular rate and rhythm with no rubs or gallops.  No thyromegaly or JVD noted.  Pacemaker is in place left upper chest wall upper chest wall . Respiratory: Clear to auscultation with no wheezes or rales. Good inspiratory effort. . Abdomen: Soft nontender nondistended with normal bowel sounds x4 quadrants. . Musculoskeletal: No  lower extremity edema. 2/4 pulses in all 4 extremities. . Skin: No ulcerative lesions noted but she does have mild redness in the groin area for which,  criticaid clear barrier moisture barrier cream has been ordered, contusion and swelling of left temple . Psychiatry: Mood is appropriate for condition and setting           Labs on Admission:  Basic Metabolic Panel: Recent Labs  Lab 10/18/18 1005 10/18/18 1016  NA 135 137  K 4.3 4.2  CL 98 101  CO2 23  --   GLUCOSE 195* 192*  BUN 40* 39*  CREATININE 2.52* 2.30*  CALCIUM 9.3  --    Liver Function Tests: Recent Labs  Lab 10/18/18 1005  AST 44*  ALT 26  ALKPHOS 123  BILITOT 3.1*  PROT 7.7  ALBUMIN 2.6*   No results for input(s): LIPASE, AMYLASE in the last 168 hours. No results for input(s): AMMONIA in the last 168 hours. CBC: Recent Labs  Lab 10/18/18 1005 10/18/18 1016  WBC 9.1  --   NEUTROABS 8.0*  --   HGB 14.5 16.7*  HCT 46.9* 49.0*  MCV 87.2  --   PLT 351  --    Cardiac Enzymes: Recent Labs  Lab 10/18/18 1005  CKTOTAL 499*    BNP (last 3 results) Recent Labs    05/11/18 1238 05/20/18 1504  BNP 581.6* 914.7*    ProBNP (last 3 results) No results for input(s): PROBNP in the last 8760 hours.  CBG: No results for input(s): GLUCAP in the last 168 hours.  Radiological Exams on Admission: Ct Head Wo Contrast  Result Date: 10/18/2018 CLINICAL DATA:  Fall EXAM: CT HEAD WITHOUT CONTRAST CT CERVICAL SPINE WITHOUT CONTRAST TECHNIQUE: Multidetector CT imaging of the head and cervical spine was performed following the standard protocol without intravenous contrast. Multiplanar CT image reconstructions of the cervical spine were also generated. COMPARISON:  CT head  05/11/2018 FINDINGS: CT HEAD FINDINGS Brain: Moderate atrophy and moderate to severe chronic microvascular ischemic change in the white matter. Negative for acute infarct, hemorrhage, mass. No midline shift. Vascular: Atherosclerotic  calcification. Negative for hyperdense vessel Skull: Negative for skull fracture Sinuses/Orbits: Negative Other: None CT CERVICAL SPINE FINDINGS Alignment: Normal Skull base and vertebrae: Negative for fracture Soft tissues and spinal canal: 11 x 16 mm posterior right thyroid nodule. Additional right anterior thyroid nodule approximately 10 mm. No adenopathy. Atherosclerotic calcification aortic arch and carotid arteries. Disc levels: Moderate to advanced disc degeneration and spondylosis C3 through C7 causing spinal and foraminal stenosis. Upper chest: Apical pleuroparenchymal scarring bilaterally. No acute abnormality Other: None IMPRESSION: 1. No acute intracranial abnormality. Moderate atrophy and moderate to severe chronic microvascular ischemia 2. Advanced cervical spondylosis.  Negative for cervical fracture 3. Right thyroid nodules. Electronically Signed   By: Franchot Gallo M.D.   On: 10/18/2018 10:47   Ct Cervical Spine Wo Contrast  Result Date: 10/18/2018 CLINICAL DATA:  Fall EXAM: CT HEAD WITHOUT CONTRAST CT CERVICAL SPINE WITHOUT CONTRAST TECHNIQUE: Multidetector CT imaging of the head and cervical spine was performed following the standard protocol without intravenous contrast. Multiplanar CT image reconstructions of the cervical spine were also generated. COMPARISON:  CT head 05/11/2018 FINDINGS: CT HEAD FINDINGS Brain: Moderate atrophy and moderate to severe chronic microvascular ischemic change in the white matter. Negative for acute infarct, hemorrhage, mass. No midline shift. Vascular: Atherosclerotic calcification. Negative for hyperdense vessel Skull: Negative for skull fracture Sinuses/Orbits: Negative Other: None CT CERVICAL SPINE FINDINGS Alignment: Normal Skull base and vertebrae: Negative for fracture Soft tissues and spinal canal: 11 x 16 mm posterior right thyroid nodule. Additional right anterior thyroid nodule approximately 10 mm. No adenopathy. Atherosclerotic calcification aortic  arch and carotid arteries. Disc levels: Moderate to advanced disc degeneration and spondylosis C3 through C7 causing spinal and foraminal stenosis. Upper chest: Apical pleuroparenchymal scarring bilaterally. No acute abnormality Other: None IMPRESSION: 1. No acute intracranial abnormality. Moderate atrophy and moderate to severe chronic microvascular ischemia 2. Advanced cervical spondylosis.  Negative for cervical fracture 3. Right thyroid nodules. Electronically Signed   By: Franchot Gallo M.D.   On: 10/18/2018 10:47   Dg Chest Port 1 View  Result Date: 10/18/2018 CLINICAL DATA:  Status post fall.  No chest pain EXAM: PORTABLE CHEST 1 VIEW COMPARISON:  05/20/2018 FINDINGS: Bilateral diffuse interstitial thickening. No pleural effusion or pneumothorax. Stable cardiomegaly. Dual lead cardiac pacemaker. No acute osseous abnormality. IMPRESSION: Cardiomegaly with mild pulmonary vascular congestion. Electronically Signed   By: Kathreen Devoid   On: 10/18/2018 12:59    EKG: Independently reviewed.    Assessment/Plan Present on Admission: . Acute lower UTI . History of pulmonary embolism . Pacemaker-Medtronic . Syncope . Chronic diastolic (congestive) heart failure ([place]) . Dyspnea on exertion . A-fib ([place]) . Cognitive change . CKD (chronic kidney disease), stage IV ([place]) . Solitary pulmonary nodule on lung CT . Syncope and collapse  Principal Problem:   Syncope Active Problems:   Acute lower UTI   History of pulmonary embolism   History of breast cancer   Pacemaker-Medtronic   Chronic diastolic (congestive) heart failure (HCC)   Dyspnea on exertion   Diabetes mellitus type 2, noninsulin dependent (HCC)   A-fib (HCC)   Cognitive change   CKD (chronic kidney disease), stage IV (HCC)   Solitary pulmonary nodule on lung CT   Fall at home, initial encounter   Pulmonary embolism on long-term anticoagulation therapy ([place])  Syncope and collapse   1.  Fall adult at home.  Her head CT was  negative except for microvascular ischemic calcification or ischemia in the brain, C-spine CT was also negative for fracture but had DJD advanced  2.  Acute urinary tract infection we will start her on ceftriaxone  3.  Dementia that is worsening continue Aricept  4.  Dehydration IV hydration  5.  Acute kidney injury with her creatinine increased to 2.3 most likely secondary to dehydration  6.  Chronic atrial fibrillation heart rate running between 110 and 120.  Patient will be continued on metoprolol she received an extra dose with IV bolus in the ED  7.  Troponin slightly elevated might be due to creatinine being elevated due to dehydration  8.  Mild rhabdomyolysis with CK increased to 499  9.  Incidental finding of right thyroid nodule on CT scan nothing to do patient is asymptomatic and due to her advanced age and dementia family are aware and does not pursuing any treatment  Severity of Illness: The appropriate patient status for this patient is INPATIENT. Inpatient status is judged to be reasonable and necessary in order to provide the required intensity of service to ensure the patient's safety. The patient's presenting symptoms, physical exam findings, and initial radiographic and laboratory data in the context of their chronic comorbidities is felt to place them at high risk for further clinical deterioration. Furthermore, it is not anticipated that the patient will be medically stable for discharge from the hospital within 2 midnights of admission. The following factors support the patient status of inpatient.   " The patient's presenting symptoms include fall atrial fibrillation with rapid ventricular rate. " The worrisome physical exam findings include head contusion CT. " The initial radiographic and laboratory data are worrisome because of CT scan was negative.  Abnormal UA " The chronic co-morbidities include dementia 8 atrial fibrillation Coumadin therapy.   * I certify that  at the point of admission it is my clinical judgment that the patient will require inpatient hospital care spanning beyond 2 midnights from the point of admission due to high intensity of service, high risk for further deterioration and high frequency of surveillance required.*    DVT prophylaxis: Coumadin TED hose  Code Status: Full  Family Communication: Daughter Hardie Shackleton and her son shayne  Disposition Plan: Home or to be determined  Consults called: None  Admission status: inpatient    Cristal Deer MD [facility] Pager [phone]  If 7PM-7AM, please contact night-coverage [email or web address] Password [email or web address]  10/18/2018, 1:51 PM

## 2018-10-18 NOTE — ED Notes (Signed)
Patient transported to CT 

## 2018-10-18 NOTE — ED Notes (Signed)
Hospitalist paged regarding patients tachycardia. IV meds ordered.

## 2018-10-18 NOTE — Progress Notes (Signed)
Per Dr Kyung Bacca:   Bolus pt with 267ml and return to maintanence level of 4ml/hr.  Expecting order for 5mg  Lopressor IV; to be administered asap.   Pt presents no more symptomatic than her arrival to [facility], which was promptly followed by departure to radiology department (not allowing time to initiate telemetry monitoring).   Upon initiating telemetry, pt presented in Afib RVR. Immediately noted and page sent to MD.

## 2018-10-18 NOTE — ED Notes (Signed)
C-collar removed per Dr Ayesha Rumpf.

## 2018-10-18 NOTE — ED Provider Notes (Signed)
[facility] EMERGENCY DEPARTMENT Provider Note   CSN: [id number] Arrival date & time: 10/18/18  0940     History   Chief Complaint Chief Complaint  Patient presents with  . Fall    HPI [name] Pecor is a 85 y.o. female.  The history is provided by the patient, a relative, the EMS personnel and medical records. No language interpreter was used.  Fall    [name] Donlan is a 85 y.o. female who presents to the Emergency Department complaining of fall. Level V caveat due to dementia. History is provided by EMS, the patient and her daughter. The patient lives at home alone and was last seen last night. Sometime during the night she got up to use the bathroom and she fell, landing between her bed and nightstand. She was stuck in that position with her head wedged. She was difficult to remove. Her daughter came to check on her this morning to give her morning medications and found her on the floor. Patient denies any pain or complaints. She is unsure how she fell or when she fell. She does have a history of atrial fibrillation and takes Coumadin. She has been seen for elevated Coumadin levels and was last seen on Friday for an INR check and it was noted to be three at that time. She is also experienced increased confusion over the last several weeks and has had medication adjustments. Past Medical History:  Diagnosis Date  . Acute gastric ulcer without mention of hemorrhage, perforation, or obstruction 2006   EGD  . Anxiety   . Atrial fibrillation (HCC)    flecainide; coumadin  . Breast cancer ([place])    left  . Carotid artery disease ([place])    63/87: RICA 5-64%, LICA 33-29%  //  Carotiud [place] 51/88: RICA 4-16%; LICA 60-63% >> FU 1 year   . Chronic diastolic heart failure (HCC)    a. echo 10/10: mild LVH, EF 55-60%, mild AI, mild MR, mild to mod LAE, mild RVE, severe RAE, mod TR, PASP 53  /  b.  Echo 3/14:  Mild LVH, EF 55%, Tr AI, MAC, mild MR, mod LAE, PASP 31,  trivial eff. // c. echo 1/17: EF 55-60%, mild AI, MAC, mild MR, moderate BAE, moderate TR  . Chronic lower back pain   . Diabetic peripheral neuropathy ([place]) 03/09/2015  . Diverticulosis   . Esophagitis, unspecified 2006   EGD  . Hiatal hernia 2006   EGD  . History of nuclear stress test    a. Myoview 2/12: EF 69%, no scar or ischemia  . Hx of adenomatous colonic polyps   . Hypertension   . Mediastinal lymphadenopathy   . Memory difficulty 11/14/2016  . Mitral regurgitation    mild, echo, October, 2010  . Osteopenia   . Presence of permanent cardiac pacemaker   . Pulmonary embolus [facility]) 2006   2006, with DVT  . Pulmonary HTN ([place])    53 mmHg echo, 2010, moderate TR, mild right ventricular enlargement  . Tachy-brady syndrome ([place])    s/p pacer 07/2009  . Thyroid nodule    non-neoplastic goiter  . Type II diabetes mellitus ([place])   . Venous insufficiency    Chronic    Patient Active Problem List   Diagnosis Date Noted  . Weakness 09/03/2018  . Pruritus 05/08/2018  . Morbid obesity due to excess calories ([place]) complicated by hbp 01/60/1093  . Solitary pulmonary nodule on lung CT 03/21/2017  . Amiodarone toxicity 03/21/2017  .  Pacemaker complications 57/26/2035  . CKD (chronic kidney disease), stage IV ([place]) 03/14/2017  . Recurrent falls 01/13/2017  . Cognitive change   . A-fib ([place]) 10/14/2016  . Dysuria 10/10/2016  . Chronic back pain 07/28/2016  . Diabetes mellitus type 2, noninsulin dependent ([place])   . Dyspnea on exertion 04/12/2016  . Hypertensive heart disease 03/28/2016  . Anemia, iron deficiency 10/22/2015  . Diabetic peripheral neuropathy ([place]) 03/09/2015  . Chronic diastolic (congestive) heart failure ([place]) 09/07/2014  . Sick sinus syndrome ([place]) 03/09/2014  . Hypertension 06/21/2013  . Osteopenia   . Pulmonary HTN ([place])   . History of pulmonary embolism   . Carotid artery disease ([place])   . History of breast cancer   . Pacemaker-[employer]tronic   . Syncope   .  Mitral regurgitation   . Mediastinal lymphadenopathy   . THYROID NODULE, HX OF 07/19/2009    Past Surgical History:  Procedure Laterality Date  . ABDOMINAL HYSTERECTOMY    . ANKLE FRACTURE SURGERY Right   . BACK SURGERY    . BREAST LUMPECTOMY Left   . FRACTURE SURGERY    . INSERT / REPLACE / REMOVE PACEMAKER  06/30/2009   MDT Adalpta L implanted by Dr Olevia Perches  . LUMBAR LAMINECTOMY  1973, 06/22/2011   "no hardware either time"     OB History   No obstetric history on file.      Home Medications    Prior to Admission medications   Medication Sig Start Date End Date Taking? Authorizing Provider  acetaminophen (TYLENOL) 325 MG tablet Take 650 mg by mouth every 6 (six) hours as needed (back pain).    Yes [provider]  bethanechol (URECHOLINE) 25 MG tablet Take 25 mg by mouth 2 (two) times daily.   Yes [provider]  furosemide (LASIX) 80 MG tablet TAKE 1 TABLET (80 MG TOTAL) BY MOUTH 2 (TWO) TIMES DAILY. 01/13/18  Yes Isaiah Serge, NP  memantine (NAMENDA) 5 MG tablet Take 1 tablet (5 mg total) by mouth 2 (two) times daily. 07/23/18  Yes Kathrynn Ducking, MD  metoprolol succinate (TOPROL-XL) 25 MG 24 hr tablet Take 3 tablets (75 mg total) by mouth daily. 09/14/18  Yes Hoyt Koch, MD  potassium chloride 20 MEQ/15ML (10%) SOLN Take 20 mEq by mouth daily as needed (if have not eaten a banana).   Yes [provider]  pregabalin (LYRICA) 75 MG capsule Take 1 capsule (75 mg total) by mouth at bedtime. 08/12/18  Yes Kathrynn Ducking, MD  promethazine (PHENERGAN) 25 MG tablet Take 1 tablet (25 mg total) by mouth every 8 (eight) hours as needed for nausea or vomiting. 10/22/16  Yes Hoyt Koch, MD  vitamin B-12 (CYANOCOBALAMIN) 500 MCG tablet Take 500 mcg by mouth daily.    Yes [provider]  warfarin (COUMADIN) 2.5 MG tablet TAKE 1/2 TABLET TO 1 TABLET DAILY OR AS DIRECTED BY COUMADIN CLINIC Patient taking differently: Take  1.25-2.5 mg by mouth See admin instructions. Take 1.25 mg tablet Tues, Thurs and Sat  2.5 mg Mon, Wed, Fri and Sun 06/30/18  Yes Dorothy Spark, MD  blood glucose meter kit and supplies KIT Dispense based on patient and insurance preference. Use before breakfast and at bedtime. Diagnosis code: E11.9 03/26/17   NcheCharlene Brooke, NP  triamcinolone cream (KENALOG) 0.1 % Apply 1 application topically 2 (two) times daily. Patient not taking: Reported on 10/18/2018 05/08/18   Hoyt Koch, MD    Family  History Family History  Problem Relation Age of Onset  . Heart disease Mother   . Hypertension Mother   . COPD Father   . Hypertension Father   . Hypertension Sister   . Hypertension Brother   . Hypertension Daughter   . Colon cancer Neg Hx   . Heart attack Neg Hx   . Stroke Neg Hx     Social History Social History   Tobacco Use  . Smoking status: Former Smoker    Packs/day: 1.00    Years: 47.00    Pack years: 47.00    Types: Cigarettes    Last attempt to quit: 10/26/1998    Years since quitting: 19.9  . Smokeless tobacco: Never Used  Substance Use Topics  . Alcohol use: No    Comment: 10/14/2016 "I'll have a beer q once in awhile"  . Drug use: No     Allergies   Amiodarone; Flecainide; Morphine and related; Bactrim ds [sulfamethoxazole-trimethoprim]; Cephalexin; and Pineapple   Review of Systems Review of Systems  All other systems reviewed and are negative.    Physical Exam Updated Vital Signs BP 110/75   Pulse 100   Temp 98.4 F (36.9 C) (Oral)   Resp 17   Ht 6' (1.829 m)   Wt 95.7 kg   SpO2 91%   BMI 28.62 kg/m   Physical Exam Vitals signs and nursing note reviewed.  Constitutional:      Appearance: She is well-developed.  HENT:     Head: Normocephalic.     Comments: Ecchymosis and swelling to the left temple Cardiovascular:     Rate and Rhythm: Regular rhythm.     Heart sounds: No murmur.     Comments: Tachycardic and  irregular Pulmonary:     Effort: Pulmonary effort is normal. No respiratory distress.     Breath sounds: Normal breath sounds.  Abdominal:     Palpations: Abdomen is soft.     Tenderness: There is no abdominal tenderness. There is no guarding or rebound.  Musculoskeletal:        General: No tenderness.  Skin:    General: Skin is warm and dry.     Capillary Refill: Capillary refill takes less than 2 seconds.  Neurological:     Mental Status: She is alert.     Comments: Alert. Disoriented to time. Oriented to location in person. There is horizontal nystagmus to the left. Five out of five grip strength and bilateral upper extremities. 3 to 4/5 strength and bilateral lower extremities.  Psychiatric:        Behavior: Behavior normal.      ED Treatments / Results  Labs (all labs ordered are listed, but only abnormal results are displayed) Labs Reviewed  COMPREHENSIVE METABOLIC PANEL - Abnormal; Notable for the following components:      Result Value   Glucose, Bld 195 (*)    BUN 40 (*)    Creatinine, Ser 2.52 (*)    Albumin 2.6 (*)    AST 44 (*)    Total Bilirubin 3.1 (*)    GFR calc non Af Amer 17 (*)    GFR calc Af Amer 19 (*)    All other components within normal limits  CBC WITH DIFFERENTIAL/PLATELET - Abnormal; Notable for the following components:   RBC 5.38 (*)    HCT 46.9 (*)    RDW 18.7 (*)    Neutro Abs 8.0 (*)    Lymphs Abs 0.4 (*)    All other  components within normal limits  URINALYSIS, ROUTINE W REFLEX MICROSCOPIC - Abnormal; Notable for the following components:   Color, Urine AMBER (*)    APPearance CLOUDY (*)    Hgb urine dipstick LARGE (*)    Protein, ur 30 (*)    Leukocytes, UA LARGE (*)    RBC / HPF >50 (*)    WBC, UA >50 (*)    Bacteria, UA MANY (*)    Non Squamous Epithelial 0-5 (*)    All other components within normal limits  PROTIME-INR - Abnormal; Notable for the following components:   Prothrombin Time 27.5 (*)    All other components  within normal limits  CK - Abnormal; Notable for the following components:   Total CK 499 (*)    All other components within normal limits  I-STAT CHEM 8, ED - Abnormal; Notable for the following components:   BUN 39 (*)    Creatinine, Ser 2.30 (*)    Glucose, Bld 192 (*)    Calcium, Ion 1.14 (*)    Hemoglobin 16.7 (*)    HCT 49.0 (*)    All other components within normal limits  URINE CULTURE  I-STAT TROPONIN, ED    EKG EKG Interpretation  Date/Time:  Sunday October 18 2018 09:45:28 EST Ventricular Rate:  117 PR Interval:    QRS Duration: 90 QT Interval:  352 QTC Calculation: 492 R Axis:   18 Text Interpretation:  Atrial fibrillation Borderline prolonged QT interval Confirmed by Quintella Reichert [phone]) on 10/18/2018 10:10:20 AM   Radiology Ct Head Wo Contrast  Result Date: 10/18/2018 CLINICAL DATA:  Fall EXAM: CT HEAD WITHOUT CONTRAST CT CERVICAL SPINE WITHOUT CONTRAST TECHNIQUE: Multidetector CT imaging of the head and cervical spine was performed following the standard protocol without intravenous contrast. Multiplanar CT image reconstructions of the cervical spine were also generated. COMPARISON:  CT head 05/11/2018 FINDINGS: CT HEAD FINDINGS Brain: Moderate atrophy and moderate to severe chronic microvascular ischemic change in the white matter. Negative for acute infarct, hemorrhage, mass. No midline shift. Vascular: Atherosclerotic calcification. Negative for hyperdense vessel Skull: Negative for skull fracture Sinuses/Orbits: Negative Other: None CT CERVICAL SPINE FINDINGS Alignment: Normal Skull base and vertebrae: Negative for fracture Soft tissues and spinal canal: 11 x 16 mm posterior right thyroid nodule. Additional right anterior thyroid nodule approximately 10 mm. No adenopathy. Atherosclerotic calcification aortic arch and carotid arteries. Disc levels: Moderate to advanced disc degeneration and spondylosis C3 through C7 causing spinal and foraminal stenosis. Upper  chest: Apical pleuroparenchymal scarring bilaterally. No acute abnormality Other: None IMPRESSION: 1. No acute intracranial abnormality. Moderate atrophy and moderate to severe chronic microvascular ischemia 2. Advanced cervical spondylosis.  Negative for cervical fracture 3. Right thyroid nodules. Electronically Signed   By: Franchot Gallo M.D.   On: 10/18/2018 10:47   Ct Cervical Spine Wo Contrast  Result Date: 10/18/2018 CLINICAL DATA:  Fall EXAM: CT HEAD WITHOUT CONTRAST CT CERVICAL SPINE WITHOUT CONTRAST TECHNIQUE: Multidetector CT imaging of the head and cervical spine was performed following the standard protocol without intravenous contrast. Multiplanar CT image reconstructions of the cervical spine were also generated. COMPARISON:  CT head 05/11/2018 FINDINGS: CT HEAD FINDINGS Brain: Moderate atrophy and moderate to severe chronic microvascular ischemic change in the white matter. Negative for acute infarct, hemorrhage, mass. No midline shift. Vascular: Atherosclerotic calcification. Negative for hyperdense vessel Skull: Negative for skull fracture Sinuses/Orbits: Negative Other: None CT CERVICAL SPINE FINDINGS Alignment: Normal Skull base and vertebrae: Negative for fracture Soft tissues and  spinal canal: 11 x 16 mm posterior right thyroid nodule. Additional right anterior thyroid nodule approximately 10 mm. No adenopathy. Atherosclerotic calcification aortic arch and carotid arteries. Disc levels: Moderate to advanced disc degeneration and spondylosis C3 through C7 causing spinal and foraminal stenosis. Upper chest: Apical pleuroparenchymal scarring bilaterally. No acute abnormality Other: None IMPRESSION: 1. No acute intracranial abnormality. Moderate atrophy and moderate to severe chronic microvascular ischemia 2. Advanced cervical spondylosis.  Negative for cervical fracture 3. Right thyroid nodules. Electronically Signed   By: Franchot Gallo M.D.   On: 10/18/2018 10:47    Procedures Procedures  (including critical care time)  Medications Ordered in ED Medications  sodium chloride 0.9 % bolus 500 mL (500 mLs Intravenous New Bag/Given 10/18/18 1145)  metoprolol succinate (TOPROL-XL) 24 hr tablet 25 mg (has no administration in time range)     Initial Impression / Assessment and Plan / ED Course  I have reviewed the triage vital signs and the nursing notes.  Pertinent labs & imaging results that were available during my care of the patient were reviewed by me and considered in my medical decision making (see chart for details).     Patient here for evaluation following an unwitnessed fall. CT had and C-spine are negative for acute injuries. Labs do demonstrate acute kidney injury in CK is mildly elevated. She was treated with gentle IV fluid hydration. Given her progressive confusion, weakness and fall with acute kidney injury medicine consulted for admission. Patient and family updated of findings of studies and recommendation for admission and they are in agreement with treatment plan. Final Clinical Impressions(s) / ED Diagnoses   Final diagnoses:  Fall, initial encounter  AKI (acute kidney injury) [facility])    ED Discharge Orders    None       Quintella Reichert, MD 10/18/18 1212

## 2018-10-18 NOTE — ED Triage Notes (Signed)
Per [facility] pt coming from home after having a fall while going to the restroom. Patient remembers getting up and falling, unsure of time. Patient has mild dementia, per family patient is at baseline. Disorientated to time. Patient has c-collar in place. Bruising and swelling to left side of face. Restricted left arm from mastectomy.

## 2018-10-18 NOTE — ED Notes (Signed)
RN spoke with hospitalist about patients heart rate after medication. MD agrees with continued plan of care and okay with transport to floor. Rapid response RN and 3E nurse aware. Patient in NAD and denies any pain.

## 2018-10-18 NOTE — Progress Notes (Signed)
ANTICOAGULATION CONSULT NOTE - Initial Consult  Pharmacy Consult for Coumadin Indication: atrial fibrillation and history of PE (2006)  Allergies  Allergen Reactions  . Amiodarone Shortness Of Breath and Nausea Only    "Toxicity," also  . Flecainide Shortness Of Breath and Other (See Comments)    "Toxicity," also   . Morphine And Related Nausea And Vomiting and Other (See Comments)    Family requests no morphine due to past reaction. Pt stated it made her sick, severe nausea and vomiting  . Bactrim Ds [Sulfamethoxazole-Trimethoprim] Diarrhea  . Cephalexin Nausea And Vomiting  . Pineapple Itching    Severe itching    Patient Measurements: Height: 6' (182.9 cm) Weight: 211 lb (95.7 kg) IBW/kg (Calculated) : 73.1  Vital Signs: Temp: 98.4 F (36.9 C) (01/19 1000) Temp Source: Oral (01/19 1000) BP: 113/87 (01/19 1300) Pulse Rate: 113 (01/19 1300)  Labs: Recent Labs    10/16/18 1605 10/18/18 1005 10/18/18 1016  HGB  --  14.5 16.7*  HCT  --  46.9* 49.0*  PLT  --  351  --   LABPROT  --  27.5*  --   INR 3.0 2.61  --   CREATININE  --  2.52* 2.30*  CKTOTAL  --  499*  --     Estimated Creatinine Clearance: 23.2 mL/min (A) (by C-G formula based on SCr of 2.3 mg/dL (H)).   Medical History: Past Medical History:  Diagnosis Date  . Acute gastric ulcer without mention of hemorrhage, perforation, or obstruction 2006   EGD  . Anxiety   . Atrial fibrillation (HCC)    flecainide; coumadin  . Breast cancer ([place])    left  . Carotid artery disease ([place])    29/92: RICA 4-26%, LICA 83-41%  //  Carotiud [place] 96/22: RICA 2-97%; LICA 98-92% >> FU 1 year   . Chronic diastolic heart failure (HCC)    a. echo 10/10: mild LVH, EF 55-60%, mild AI, mild MR, mild to mod LAE, mild RVE, severe RAE, mod TR, PASP 53  /  b.  Echo 3/14:  Mild LVH, EF 55%, Tr AI, MAC, mild MR, mod LAE, PASP 31, trivial eff. // c. echo 1/17: EF 55-60%, mild AI, MAC, mild MR, moderate BAE, moderate TR  . Chronic  lower back pain   . Diabetic peripheral neuropathy ([place]) 03/09/2015  . Diverticulosis   . Esophagitis, unspecified 2006   EGD  . Hiatal hernia 2006   EGD  . History of nuclear stress test    a. Myoview 2/12: EF 69%, no scar or ischemia  . Hx of adenomatous colonic polyps   . Hypertension   . Mediastinal lymphadenopathy   . Memory difficulty 11/14/2016  . Mitral regurgitation    mild, echo, October, 2010  . Osteopenia   . Presence of permanent cardiac pacemaker   . Pulmonary embolus [facility]) 2006   2006, with DVT  . Pulmonary HTN ([place])    53 mmHg echo, 2010, moderate TR, mild right ventricular enlargement  . Tachy-brady syndrome ([place])    s/p pacer 07/2009  . Thyroid nodule    non-neoplastic goiter  . Type II diabetes mellitus ([place])   . Venous insufficiency    Chronic   Assessment: 85 year old female found down at home. Patient was on chronic Coumadin therapy for atrial fibrillation and history of a PE (2006). INR on admission is therapeutic at 2.61. No overt bleeding noted. Head CT was negative for bleeding. CBC is within normal limits.  Pharmacy  was consult to resume Coumadin therapy.   Home regimen was 2.60m every Monday and 1.25 all other days. Patient's last dose was on 10/17/18 in the PM.   Goal of Therapy:  INR 2-3 Monitor platelets by anticoagulation protocol: Yes   Plan:  Coumadin 1.230mpo x1 today. Daily PT/INR  JeSloan LeiterPharmD, BCPS, BCCCP Clinical Pharmacist Please refer to AMBassett Army Community Hospitalor MCFort Thomasumbers 10/18/2018,1:59 PM

## 2018-10-19 ENCOUNTER — Inpatient Hospital Stay (HOSPITAL_COMMUNITY): Payer: Medicare HMO

## 2018-10-19 DIAGNOSIS — I340 Nonrheumatic mitral (valve) insufficiency: Secondary | ICD-10-CM

## 2018-10-19 LAB — BASIC METABOLIC PANEL
Anion gap: 12 (ref 5–15)
BUN: 37 mg/dL — ABNORMAL HIGH (ref 8–23)
CALCIUM: 7.5 mg/dL — AB (ref 8.9–10.3)
CO2: 19 mmol/L — ABNORMAL LOW (ref 22–32)
CREATININE: 2.06 mg/dL — AB (ref 0.44–1.00)
Chloride: 107 mmol/L (ref 98–111)
GFR calc Af Amer: 25 mL/min — ABNORMAL LOW (ref >60)
GFR calc non Af Amer: 21 mL/min — ABNORMAL LOW (ref >60)
Glucose, Bld: 181 mg/dL — ABNORMAL HIGH (ref 70–99)
Potassium: 3.9 mmol/L (ref 3.5–5.1)
Sodium: 138 mmol/L (ref 135–145)

## 2018-10-19 LAB — CBC
HCT: 37.9 % (ref 36.0–46.0)
Hemoglobin: 11.7 g/dL — ABNORMAL LOW (ref 12.0–15.0)
MCH: 27.1 pg (ref 26.0–34.0)
MCHC: 30.9 g/dL (ref 30.0–36.0)
MCV: 87.9 fL (ref 80.0–100.0)
Platelets: 286 10*3/uL (ref 150–400)
RBC: 4.31 MIL/uL (ref 3.87–5.11)
RDW: 18.8 % — ABNORMAL HIGH (ref 11.5–15.5)
WBC: 11.5 10*3/uL — ABNORMAL HIGH (ref 4.0–10.5)
nRBC: 0 % (ref 0.0–0.2)

## 2018-10-19 LAB — ECHOCARDIOGRAM COMPLETE
Height: 72 in
WEIGHTICAEL: 3520 [oz_av]

## 2018-10-19 LAB — URINE CULTURE: Culture: NO GROWTH

## 2018-10-19 LAB — PROTIME-INR
INR: 3.81
Prothrombin Time: 36.9 seconds — ABNORMAL HIGH (ref 11.4–15.2)

## 2018-10-19 LAB — APTT: aPTT: 49 seconds — ABNORMAL HIGH (ref 24–36)

## 2018-10-19 LAB — CK: Total CK: 283 U/L — ABNORMAL HIGH (ref 38–234)

## 2018-10-19 LAB — GLUCOSE, CAPILLARY: Glucose-Capillary: 137 mg/dL — ABNORMAL HIGH (ref 70–99)

## 2018-10-19 MED ORDER — DILTIAZEM HCL 30 MG PO TABS
30.0000 mg | ORAL_TABLET | Freq: Three times a day (TID) | ORAL | Status: DC
  Administered 2018-10-19 – 2018-10-20 (×3): 30 mg via ORAL
  Filled 2018-10-19 (×3): qty 1

## 2018-10-19 NOTE — Progress Notes (Signed)
  Echocardiogram 2D Echocardiogram has been performed.  [name] [name] 10/19/2018, 12:34 PM

## 2018-10-19 NOTE — Progress Notes (Signed)
ANTICOAGULATION CONSULT NOTE -follow up Pharmacy Consult for Coumadin Indication: atrial fibrillation and history of PE (2006)  Allergies  Allergen Reactions  . Amiodarone Shortness Of Breath and Nausea Only    "Toxicity," also  . Flecainide Shortness Of Breath and Other (See Comments)    "Toxicity," also   . Morphine And Related Nausea And Vomiting and Other (See Comments)    Family requests no morphine due to past reaction. Pt stated it made her sick, severe nausea and vomiting  . Bactrim Ds [Sulfamethoxazole-Trimethoprim] Diarrhea  . Cephalexin Nausea And Vomiting  . Pineapple Itching    Severe itching    Patient Measurements: Height: 6' (182.9 cm) Weight: 220 lb (99.8 kg) IBW/kg (Calculated) : 73.1  Vital Signs: Temp: 98.8 F (37.1 C) (01/20 0849) Temp Source: Oral (01/20 0849) BP: 95/60 (01/20 0827) Pulse Rate: 101 (01/20 0827)  Labs: Recent Labs    10/16/18 1605  10/18/18 1005 10/18/18 1016 10/19/18 0528  HGB  --    < > 14.5 16.7* 11.7*  HCT  --   --  46.9* 49.0* 37.9  PLT  --   --  351  --  286  APTT  --   --   --   --  49*  LABPROT  --   --  27.5*  --  36.9*  INR 3.0  --  2.61  --  3.81  CREATININE  --   --  2.52* 2.30* 2.06*  CKTOTAL  --   --  499*  --  283*   < > = values in this interval not displayed.    Estimated Creatinine Clearance: 26.4 mL/min (A) (by C-G formula based on SCr of 2.06 mg/dL (H)).   Medical History: Past Medical History:  Diagnosis Date  . Acute gastric ulcer without mention of hemorrhage, perforation, or obstruction 2006   EGD  . Anxiety   . Atrial fibrillation (HCC)    flecainide; coumadin  . Breast cancer ([place])    left  . Carotid artery disease ([place])    37/10: RICA 6-26%, LICA 94-85%  //  Carotiud [place] 46/27: RICA 0-35%; LICA 00-93% >> FU 1 year   . Chronic diastolic heart failure (HCC)    a. echo 10/10: mild LVH, EF 55-60%, mild AI, mild MR, mild to mod LAE, mild RVE, severe RAE, mod TR, PASP 53  /  b.  Echo 3/14:  Mild  LVH, EF 55%, Tr AI, MAC, mild MR, mod LAE, PASP 31, trivial eff. // c. echo 1/17: EF 55-60%, mild AI, MAC, mild MR, moderate BAE, moderate TR  . Chronic lower back pain   . Diabetic peripheral neuropathy ([place]) 03/09/2015  . Diverticulosis   . Esophagitis, unspecified 2006   EGD  . Hiatal hernia 2006   EGD  . History of nuclear stress test    a. Myoview 2/12: EF 69%, no scar or ischemia  . Hx of adenomatous colonic polyps   . Hypertension   . Mediastinal lymphadenopathy   . Memory difficulty 11/14/2016  . Mitral regurgitation    mild, echo, October, 2010  . Osteopenia   . Presence of permanent cardiac pacemaker   . Pulmonary embolus [facility]) 2006   2006, with DVT  . Pulmonary HTN ([place])    53 mmHg echo, 2010, moderate TR, mild right ventricular enlargement  . Tachy-brady syndrome ([place])    s/p pacer 07/2009  . Thyroid nodule    non-neoplastic goiter  . Type II diabetes mellitus ([place])   .  Venous insufficiency    Chronic   Assessment: 85 year old female found down at home. Patient was on chronic Coumadin therapy for atrial fibrillation and history of a PE (2006). INR on admission is therapeutic at 2.61. No overt bleeding noted. Head CT was negative for bleeding. CBC is within normal limits.  Pharmacy was consulted 10/18/18 to resume Coumadin therapy.   Home regimen was 2.43m every Monday and 1.25 all other days. Patient's last dose PTA was on 10/17/18 in the PM.   Today's INR = 3.81 , increased, supratherapeutic. No bleeding noted.   Goal of Therapy:  INR 2-3 Monitor platelets by anticoagulation protocol: Yes   Plan:  Hold Coumadin today. Daily PT/INR  RNicole Cella RPh Clinical Pharmacist [phone] check AMION for all MEast Troyphone numbers After 10:00 PM, call [phone]/20/2020,12:56 PM

## 2018-10-19 NOTE — Progress Notes (Signed)
   10/19/18 0511  MEWS Score  Resp 18  Pulse Rate (!) 101  BP (!) 87/61  Temp 98 F (36.7 C)  MEWS RR 0  MEWS Pulse 1  MEWS Systolic 1  MEWS LOC 0  MEWS Temp 0  MEWS Score 2  MEWS Score Color Yellow  MEWS ECG HR/ Pulse 101  Pt is asymptomatic, pt on cardizem drip. Bodenheimer NP notified, will recheck BP in an hour.

## 2018-10-19 NOTE — Progress Notes (Addendum)
PROGRESS NOTE    [name] [name]  OJJ:[id number] DOB: 03-14-1931 DOA: 10/18/2018 PCP: Hoyt Koch, MD   Brief Narrative: Patient is 85 year old female with history of atrial fibrillation on Coumadin, dementia, history of breast cancer status post left mastectomy, status post breast replacement who was brought by her family because she fell at home.  Reported to have been progressively weak for last 3 weeks. Also found to be dyspneaic. She was also confused.  Found to be in acute kidney injury on presentation.Admitted for further work-up.  Assessment & Plan:   Principal Problem:   Syncope Active Problems:   Acute lower UTI   History of pulmonary embolism   History of breast cancer   Pacemaker-[employer]   Chronic diastolic (congestive) heart failure (HCC)   Dyspnea on exertion   Diabetes mellitus type 2, noninsulin dependent (HCC)   A-fib (HCC)   Cognitive change   CKD (chronic kidney disease), stage IV (HCC)   Solitary pulmonary nodule on lung CT   Fall at home, initial encounter   Pulmonary embolism on long-term anticoagulation therapy ([place])   Syncope and collapse  Fall: Sustained fall at home.  CT head did not show any acute intracranial abnormalities  C-spine CT negative for fracture. Physical therapy already evaluated the patient and recommended skilled nursing facility.  Social work consulted.  Urinary tract infection: Could not clarify about dysuria due to her dementia.  UA was frankly abnormal.  Urine culture sent.  Started on ceftriaxone.  Acute kidney injury on CKD stage 3: Presented with creatinine of more than 2.  Baseline creatinine around 1.5 .started on IV fluids.  Slowly improving.  Chronic atrial fibrillation: Found to be A. fib with RVR on presentation.  Started on Cardizem drip.  Currently heart rate is well controlled.  We will stop Cardizem drip and start on oral Cardizem which will later change to long-acting .Continue metoprolol.  On  anticoagulation with warfarin at home.  Use of warfarin on this elderly lady with falls is very risky.  INR is supratherapeutic so we will hold warfarin for now.  Will talk to her family about discontinuation of warfarin.  Dyspnea:Echo has also been ordered.  Rule out heart failure.  Mild rhabdomyolysis: CK elevated to 499 on presentation.  Associated with fall.  Continue hydration  Incidental finding of right thyroid nodule: On CT scan.  Patient is asymptomatic.  Due to her advanced age and dementia family does not want to pursue any further investigation.  Dementia: Patient is pleasantly demented.  Continue supportive care.  Continue Aricept.         DVT prophylaxis: Supratherapeutic INR Code Status: Full Family Communication: None present at the bedside Disposition Plan: Skilled nursing facility   Consultants: None  Procedures:none  Antimicrobials:  Anti-infectives (From admission, onward)   Start     Dose/Rate Route Frequency Ordered Stop   10/18/18 1415  cefTRIAXone (ROCEPHIN) 1 g in sodium chloride 0.9 % 100 mL IVPB     1 g 200 mL/hr over 30 Minutes Intravenous Every 24 hours 10/18/18 1400     10/18/18 1300  cefTRIAXone (ROCEPHIN) 1 g in sodium chloride 0.9 % 100 mL IVPB  Status:  Discontinued     1 g 200 mL/hr over 30 Minutes Intravenous  Once 10/18/18 1257 10/18/18 1258      Subjective: Patient seen and examined at bedside this morning. Remains comfortable.  Heart rate better controlled this morning.  Pleasantly  demented.  Denies any complaints.  Objective: Vitals:  10/19/18 0511 10/19/18 0627 10/19/18 0827 10/19/18 0849  BP: (!) 87/61 94/65 95/60    Pulse: (!) 101 98 (!) 101   Resp: 18   19  Temp: 98 F (36.7 C)   98.8 F (37.1 C)  TempSrc: Oral   Oral  SpO2: 91%  92%   Weight: 99.8 kg     Height:        Intake/Output Summary (Last 24 hours) at 10/19/2018 1225 Last data filed at 10/19/2018 0934 Gross per 24 hour  Intake 2343.21 ml  Output 900 ml    Net 1443.21 ml   Filed Weights   10/18/18 1026 10/19/18 0511  Weight: 95.7 kg 99.8 kg    Examination:  General exam: Appears calm and comfortable ,Not in distress,average built HEENT:PERRL,Oral mucosa moist, Ear/Nose normal on gross exam Respiratory system: Decreased air entry in the bases Cardiovascular system: Irregularly irregular, No JVD, murmurs, rubs, gallops or clicks. No pedal edema.Pacemaker Gastrointestinal system: Abdomen is nondistended, soft and nontender. No organomegaly or masses felt. Normal bowel sounds heard. Central nervous system: Alert but not  oriented. No focal neurological deficits. Extremities: No edema, no clubbing ,no cyanosis, distal peripheral pulses palpable. Skin: No rashes, lesions or ulcers,no icterus ,no pallor     Data Reviewed: I have personally reviewed following labs and imaging studies  CBC: Recent Labs  Lab 10/18/18 1005 10/18/18 1016 10/19/18 0528  WBC 9.1  --  11.5*  NEUTROABS 8.0*  --   --   HGB 14.5 16.7* 11.7*  HCT 46.9* 49.0* 37.9  MCV 87.2  --  87.9  PLT 351  --  706   Basic Metabolic Panel: Recent Labs  Lab 10/18/18 1005 10/18/18 1016 10/19/18 0528  NA 135 137 138  K 4.3 4.2 3.9  CL 98 101 107  CO2 23  --  19*  GLUCOSE 195* 192* 181*  BUN 40* 39* 37*  CREATININE 2.52* 2.30* 2.06*  CALCIUM 9.3  --  7.5*   GFR: Estimated Creatinine Clearance: 26.4 mL/min (A) (by C-G formula based on SCr of 2.06 mg/dL (H)). Liver Function Tests: Recent Labs  Lab 10/18/18 1005  AST 44*  ALT 26  ALKPHOS 123  BILITOT 3.1*  PROT 7.7  ALBUMIN 2.6*   No results for input(s): LIPASE, AMYLASE in the last 168 hours. No results for input(s): AMMONIA in the last 168 hours. Coagulation Profile: Recent Labs  Lab 10/16/18 1605 10/18/18 1005 10/19/18 0528  INR 3.0 2.61 3.81   Cardiac Enzymes: Recent Labs  Lab 10/18/18 1005 10/19/18 0528  CKTOTAL 499* 283*   BNP (last 3 results) No results for input(s): PROBNP in the last  8760 hours. HbA1C: No results for input(s): HGBA1C in the last 72 hours. CBG: Recent Labs  Lab 10/19/18 0733  GLUCAP 137*   Lipid Profile: No results for input(s): CHOL, HDL, LDLCALC, TRIG, CHOLHDL, LDLDIRECT in the last 72 hours. Thyroid Function Tests: No results for input(s): TSH, T4TOTAL, FREET4, T3FREE, THYROIDAB in the last 72 hours. Anemia Panel: No results for input(s): VITAMINB12, FOLATE, FERRITIN, TIBC, IRON, RETICCTPCT in the last 72 hours. Sepsis Labs: No results for input(s): PROCALCITON, LATICACIDVEN in the last 168 hours.  Recent Results (from the past 240 hour(s))  Urine culture     Status: None   Collection Time: 10/18/18 11:48 AM  Result Value Ref Range Status   Specimen Description URINE, RANDOM  Final   Special Requests   Final    NONE Performed at [facility], [address], [place], [place] [address]    Culture NO GROWTH  Final   Report Status 10/19/2018 FINAL  Final         Radiology Studies: Dg Chest 1 View  Result Date: 10/18/2018 CLINICAL DATA:  Dyspnea EXAM: CHEST  1 VIEW COMPARISON:  05/20/2018 FINDINGS: Diffuse bilateral mild interstitial thickening. No pleural effusion or pneumothorax. Stable cardiomegaly. Dual lead cardiac pacemaker. No acute osseous abnormality. Small hiatal hernia. Surgical clips in the left axilla. IMPRESSION: Findings concerning for mild CHF. Electronically Signed   By: Kathreen Devoid   On: 10/18/2018 16:54   Ct Head Wo Contrast  Result Date: 10/18/2018 CLINICAL DATA:  Fall EXAM: CT HEAD WITHOUT CONTRAST CT CERVICAL SPINE WITHOUT CONTRAST TECHNIQUE: Multidetector CT imaging of the head and cervical spine was performed following the standard protocol without intravenous contrast. Multiplanar CT image reconstructions of the cervical spine were also generated. COMPARISON:  CT head 05/11/2018 FINDINGS: CT HEAD FINDINGS Brain: Moderate atrophy and moderate to severe chronic microvascular ischemic change in the white  matter. Negative for acute infarct, hemorrhage, mass. No midline shift. Vascular: Atherosclerotic calcification. Negative for hyperdense vessel Skull: Negative for skull fracture Sinuses/Orbits: Negative Other: None CT CERVICAL SPINE FINDINGS Alignment: Normal Skull base and vertebrae: Negative for fracture Soft tissues and spinal canal: 11 x 16 mm posterior right thyroid nodule. Additional right anterior thyroid nodule approximately 10 mm. No adenopathy. Atherosclerotic calcification aortic arch and carotid arteries. Disc levels: Moderate to advanced disc degeneration and spondylosis C3 through C7 causing spinal and foraminal stenosis. Upper chest: Apical pleuroparenchymal scarring bilaterally. No acute abnormality Other: None IMPRESSION: 1. No acute intracranial abnormality. Moderate atrophy and moderate to severe chronic microvascular ischemia 2. Advanced cervical spondylosis.  Negative for cervical fracture 3. Right thyroid nodules. Electronically Signed   By: Franchot Gallo M.D.   On: 10/18/2018 10:47   Ct Cervical Spine Wo Contrast  Result Date: 10/18/2018 CLINICAL DATA:  Fall EXAM: CT HEAD WITHOUT CONTRAST CT CERVICAL SPINE WITHOUT CONTRAST TECHNIQUE: Multidetector CT imaging of the head and cervical spine was performed following the standard protocol without intravenous contrast. Multiplanar CT image reconstructions of the cervical spine were also generated. COMPARISON:  CT head 05/11/2018 FINDINGS: CT HEAD FINDINGS Brain: Moderate atrophy and moderate to severe chronic microvascular ischemic change in the white matter. Negative for acute infarct, hemorrhage, mass. No midline shift. Vascular: Atherosclerotic calcification. Negative for hyperdense vessel Skull: Negative for skull fracture Sinuses/Orbits: Negative Other: None CT CERVICAL SPINE FINDINGS Alignment: Normal Skull base and vertebrae: Negative for fracture Soft tissues and spinal canal: 11 x 16 mm posterior right thyroid nodule. Additional right  anterior thyroid nodule approximately 10 mm. No adenopathy. Atherosclerotic calcification aortic arch and carotid arteries. Disc levels: Moderate to advanced disc degeneration and spondylosis C3 through C7 causing spinal and foraminal stenosis. Upper chest: Apical pleuroparenchymal scarring bilaterally. No acute abnormality Other: None IMPRESSION: 1. No acute intracranial abnormality. Moderate atrophy and moderate to severe chronic microvascular ischemia 2. Advanced cervical spondylosis.  Negative for cervical fracture 3. Right thyroid nodules. Electronically Signed   By: Franchot Gallo M.D.   On: 10/18/2018 10:47   Dg Chest Port 1 View  Result Date: 10/18/2018 CLINICAL DATA:  Status post fall.  No chest pain EXAM: PORTABLE CHEST 1 VIEW COMPARISON:  05/20/2018 FINDINGS: Bilateral diffuse interstitial thickening. No pleural effusion or pneumothorax. Stable cardiomegaly. Dual lead cardiac pacemaker. No acute osseous abnormality. IMPRESSION: Cardiomegaly with mild pulmonary vascular congestion. Electronically Signed   By: Kathreen Devoid  On: 10/18/2018 12:59        Scheduled Meds: . bethanechol  25 mg Oral BID  . memantine  5 mg Oral BID  . metoprolol succinate  25 mg Oral Daily  . pregabalin  75 mg Oral QHS  . sodium chloride flush  3 mL Intravenous Q12H  . vitamin B-12  500 mcg Oral Daily  . Warfarin - Pharmacist Dosing Inpatient   Does not apply q1800   Continuous Infusions: . sodium chloride 50 mL/hr at 10/19/18 0829  . cefTRIAXone (ROCEPHIN)  IV Stopped (10/18/18 1611)  . diltiazem (CARDIZEM) infusion 5 mg/hr (10/18/18 2112)     LOS: 1 day    Time spent:35 mins. More than 50% of that time was spent in counseling and/or coordination of care.      Shelly Coss, MD [facility] Pager [phone]  If 7PM-7AM, please contact night-coverage [email or web address] Password [facility] 10/19/2018, 12:25 PM

## 2018-10-19 NOTE — Evaluation (Signed)
Physical Therapy Evaluation Patient Details Name: [name] [name] MRN: [id number] DOB: 1931-03-03 Today's Date: 10/19/2018   History of Present Illness  Pt is an 85 y.o. female admitted 10/18/18 after falling at home, found down by family; reports progressive weakness and DOE. Found to be severely dehydrated with UTI; mild rhabdomyolysis. Head and neck CT negative for acute abnormality. PMH Includes afib (on Coumadin), dementia, pacemaker, breast CA.     Clinical Impression  Pt presents with an overall decrease in functional mobility secondary to above. PTA, pt reports indep without device, unsure if reliable historian; lives alone with family available for PRN assist. Today's evaluation limited to bed-level mobility secondary to pt's fatigue. Pt with bilateral LE weakness; at risk for additional falls. SpO2 >90% on RA. Discussed recommendation for SNF-level therapies secondary to weakness and fall risk; pt not responding with an opinion on this despite answering all other questions appropriately. Will follow acutely to address established goals.    Follow Up Recommendations SNF;Supervision for mobility/OOB    Equipment Recommendations  None recommended by PT    Recommendations for Other Services       Precautions / Restrictions Precautions Precautions: Fall Restrictions Weight Bearing Restrictions: No      Mobility  Bed Mobility Overal bed mobility: Needs Assistance             General bed mobility comments: Able to initiate BLE movement towards EOB with max cues; further movement evaluation limited by fatigue   Transfers                 General transfer comment: NT  Ambulation/Gait             General Gait Details: NT  Stairs            Wheelchair Mobility    Modified Rankin (Stroke Patients Only)       Balance                                             Pertinent Vitals/Pain Pain Assessment: No/denies pain    Home  Living Family/patient expects to be discharged to:: Private residence Living Arrangements: Alone Available Help at Discharge: Family;Available PRN/intermittently Type of Home: House Home Access: Level entry     Home Layout: One level Home Equipment: Walker - 2 wheels;Bedside commode;Cane - single point      Prior Function Level of Independence: Independent with assistive device(s)         Comments: Pt reports indep without device, although owns cane and walker. Indep with ADLs and household tasks. Reports she drives (per chart 5 months ago, not driving anymore). Reports this was only fall in the past year(Unsure if reliable historian)     Hand Dominance        Extremity/Trunk Assessment   Upper Extremity Assessment Upper Extremity Assessment: Generalized weakness    Lower Extremity Assessment Lower Extremity Assessment: RLE deficits/detail;LLE deficits/detail RLE Deficits / Details: R hip flex <3/5; knee flex/ext 3-4/5; hip abd/add at least 3/5; ankle ROM WFL LLE Deficits / Details: L hip flex 3/5; knee flex/ext 3-4/5; abd/add at least 3/5; ankle ROM WFL       Communication   Communication: No difficulties  Cognition Arousal/Alertness: Lethargic Behavior During Therapy: Flat affect Overall Cognitive Status: History of cognitive impairments - at baseline  General Comments: Pt tired having just woken up, initially falling asleep during conversation, but then awake and conversant, although keeping eyes closed majority of session. Pt continued to state she was agreeable for OOB, but never moving; able to initiate movement to EOB, but stopping despite max encouragement      General Comments General comments (skin integrity, edema, etc.): SpO2 >90% on RA    Exercises     Assessment/Plan    PT Assessment Patient needs continued PT services  PT Problem List Decreased strength;Decreased range of motion;Decreased activity  tolerance;Decreased balance;Decreased mobility;Decreased cognition;Decreased knowledge of use of DME       PT Treatment Interventions DME instruction;Gait training;Stair training;Functional mobility training;Therapeutic activities;Therapeutic exercise;Balance training;Patient/family education    PT Goals (Current goals can be found in the Care Plan section)  Acute Rehab PT Goals Patient Stated Goal: None stated PT Goal Formulation: With patient Time For Goal Achievement: 11/02/18    Frequency Min 2X/week   Barriers to discharge Decreased caregiver support      Co-evaluation               AM-PAC PT "6 Clicks" Mobility  Outcome Measure Help needed turning from your back to your side while in a flat bed without using bedrails?: A Little Help needed moving from lying on your back to sitting on the side of a flat bed without using bedrails?: A Little Help needed moving to and from a bed to a chair (including a wheelchair)?: A Lot Help needed standing up from a chair using your arms (e.g., wheelchair or bedside chair)?: A Lot Help needed to walk in hospital room?: A Lot Help needed climbing 3-5 steps with a railing? : Total 6 Click Score: 13    End of Session Equipment Utilized During Treatment: Gait belt Activity Tolerance: Patient limited by fatigue Patient left: in bed;with call bell/phone within reach;with bed alarm set Nurse Communication: Mobility status PT Visit Diagnosis: Other abnormalities of gait and mobility (R26.89)    Time: 7893-8101 PT Time Calculation (min) (ACUTE ONLY): 14 min   Charges:   PT Evaluation $PT Eval Moderate Complexity: [place], PT, DPT Acute Rehabilitation Services  Pager [phone] Office [place] 10/19/2018, 8:32 AM

## 2018-10-19 NOTE — Clinical Social Work Note (Signed)
Clinical Social Work Assessment  Patient Details  Name: [name] [name] MRN: [id number] Date of Birth: [birth year]  Date of referral:  10/19/18               Reason for consult:  Facility Placement, Discharge Planning                Permission sought to share information with:  Facility Sport and exercise psychologist, Family Supports Permission granted to share information::  Yes, Verbal Permission Granted  Name::     Artemio Aly  Agency::  SNFs  Relationship::  daughter  Contact Information:  [phone]  Housing/Transportation Living arrangements for the past 2 months:  Single Family Home Source of Information:  Patient, Adult Children Patient Interpreter Needed:  None Criminal Activity/Legal Involvement Pertinent to Current Situation/Hospitalization:  No - Comment as needed Significant Relationships:  Adult Children Lives with:  Self Do you feel safe going back to the place where you live?  Yes Need for family participation in patient care:  Yes (Comment)  Care giving concerns: Patient from home alone. PT recommending SNF.   Social Worker assessment / plan: CSW met with patient at bedside. Patient alert and oriented. CSW introduced self and role and discussed disposition planning - PT recommendation for SNF.   Patient was agreeable to rehab at [facility]. She stated she lives alone and is usually able to manage somewhat independently. Her daughter comes over to help her with cooking and cleaning.   CSW also met with patient's daughter, [name] [name], at bedside in the afternoon; patient was asleep. Patient's daughter also agreeable to SNF. Daughter is concerned about patient's multiple falls at home.   Explained referral and insurance authorization process to patient and daughter. Faxed out initial referrals. Provided daughter with CMS SNF list and bed offers received so far. Daughter interested in [facility], but [facility] offer still pending. [facility].   Patient will need  [employer] authorization prior to admitting to a facility; [facility] can take several days. Facility to initiate auth once identified. Patient's PASRR is also pending; unable to screen today as PASRR office closed for the holiday and patient's information not matching in the system. CSW to follow and support with discharge planning.  Employment status:  Retired Astronomer) PT Recommendations:  [place] / Referral to community resources:  [place]  Patient/Family's Response to care: Patient and daughter appreciative of care.  Patient/Family's Understanding of and Emotional Response to Diagnosis, Current Treatment, and Prognosis: Patient and daughter with understanding of patient's condition and recommendation for SNF. They are agreeable to rehab at [facility].  Emotional Assessment Appearance:  Appears stated age Attitude/Demeanor/Rapport:  Engaged, Lethargic Affect (typically observed):  Accepting, Calm, Pleasant, Appropriate Orientation:  Oriented to Self, Oriented to Place, Oriented to Situation Alcohol / Substance use:  Not Applicable Psych involvement (Current and /or in the community):  No (Comment)  Discharge Needs  Concerns to be addressed:  Discharge Planning Concerns, Care Coordination Readmission within the last 30 days:  No Current discharge risk:  Physical Impairment Barriers to Discharge:  Continued Medical Work up, Ship broker, Programmer, applications ([place])   Estanislado Emms, LCSW 10/19/2018, 3:12 PM

## 2018-10-19 NOTE — [place] FL2 (Signed)
[place] MEDICAID FL2 LEVEL OF CARE SCREENING TOOL     IDENTIFICATION  Patient Name: [name] [name] Birthdate: 1931-08-15 Sex: female Admission Date (Current Location): 10/18/2018  [facility] and [place] Number:  Herbalist and Address:  [facility] [place]. [facility], [place] [address], [place], [place] [address]      Provider Number: [id number]  Attending Physician Name and Address:  Shelly Coss, MD  Relative Name and Phone Number:  Artemio Aly, daughter, [phone]    Current Level of Care: Hospital Recommended Level of Care: [place] Prior Approval Number:    Date Approved/Denied:   PASRR Number: unable to screen today  Discharge Plan: SNF    Current Diagnoses: Patient Active Problem List   Diagnosis Date Noted  . Fall at home, initial encounter 10/18/2018  . Pulmonary embolism on long-term anticoagulation therapy ([place]) 10/18/2018  . Syncope and collapse 10/18/2018  . Weakness 09/03/2018  . Pruritus 05/08/2018  . Morbid obesity due to excess calories ([place]) complicated by hbp 26/71/2458  . Solitary pulmonary nodule on lung CT 03/21/2017  . Amiodarone toxicity 03/21/2017  . Pacemaker complications 09/98/3382  . CKD (chronic kidney disease), stage IV ([place]) 03/14/2017  . Recurrent falls 01/13/2017  . Cognitive change   . A-fib ([place]) 10/14/2016  . Dysuria 10/10/2016  . Chronic back pain 07/28/2016  . Diabetes mellitus type 2, noninsulin dependent ([place])   . Dyspnea on exertion 04/12/2016  . Hypertensive heart disease 03/28/2016  . Anemia, iron deficiency 10/22/2015  . Diabetic peripheral neuropathy ([place]) 03/09/2015  . Chronic diastolic (congestive) heart failure ([place]) 09/07/2014  . Sick sinus syndrome ([place]) 03/09/2014  . Hypertension 06/21/2013  . Osteopenia   . Pulmonary HTN ([place])   . History of pulmonary embolism   . Carotid artery disease ([place])   . History of breast cancer   . Pacemaker-Medtronic   . Syncope   .  Mitral regurgitation   . Mediastinal lymphadenopathy   . Acute lower UTI 07/19/2009    Orientation RESPIRATION BLADDER Height & Weight     Self, Situation, Place  O2(nasal cannula 2L) Incontinent, External catheter Weight: 99.8 kg Height:  6' (182.9 cm)  BEHAVIORAL SYMPTOMS/MOOD NEUROLOGICAL BOWEL NUTRITION STATUS      Continent Diet(please see DC summary)  AMBULATORY STATUS COMMUNICATION OF NEEDS Skin   Extensive Assist Verbally Normal                       Personal Care Assistance Level of Assistance  Bathing, Feeding, Dressing Bathing Assistance: Limited assistance Feeding assistance: Independent Dressing Assistance: Limited assistance     Functional Limitations Info  Sight, Hearing, Speech Sight Info: Adequate Hearing Info: Adequate Speech Info: Adequate    SPECIAL CARE FACTORS FREQUENCY  PT (By licensed PT)     PT Frequency: 5x/week              Contractures Contractures Info: Not present    Additional Factors Info  Code Status, Allergies Code Status Info: Full Allergies Info: Amiodarone, Flecainide, Morphine And Related, Bactrim Ds (Sulfamethoxazole-trimethoprim), Cephalexin, Pineapple           Current Medications (10/19/2018):  This is the current hospital active medication list Current Facility-Administered Medications  Medication Dose Route Frequency Provider Last Rate Last Dose  . 0.9 %  sodium chloride infusion   Intravenous Continuous Shelly Coss, MD 100 mL/hr at 10/19/18 1239    . acetaminophen (TYLENOL) tablet 650 mg  650 mg Oral Q6H PRN Kyung Bacca,  Forrest Moron, MD   650 mg at 10/18/18 2040  . bethanechol (URECHOLINE) tablet 25 mg  25 mg Oral BID Cristal Deer, MD   25 mg at 10/19/18 8421  . cefTRIAXone (ROCEPHIN) 1 g in sodium chloride 0.9 % 100 mL IVPB  1 g Intravenous Q24H Cristal Deer, MD 200 mL/hr at 10/19/18 1316 1 g at 10/19/18 1316  . diltiazem (CARDIZEM) tablet 30 mg  30 mg Oral Q8H Adhikari, Amrit, MD   30 mg at 10/19/18 1316   . dimethicone 1 % cream   Topical BID PRN Cristal Deer, MD      . memantine [facility]) tablet 5 mg  5 mg Oral BID Cristal Deer, MD   5 mg at 10/19/18 0312  . metoprolol succinate (TOPROL-XL) 24 hr tablet 25 mg  25 mg Oral Daily Cristal Deer, MD   25 mg at 10/19/18 8118  . pregabalin (LYRICA) capsule 75 mg  75 mg Oral QHS Cristal Deer, MD   75 mg at 10/18/18 2234  . senna-docusate (Senokot-S) tablet 1 tablet  1 tablet Oral QHS PRN Cristal Deer, MD      . sodium chloride flush (NS) 0.9 % injection 3 mL  3 mL Intravenous Q12H Cristal Deer, MD   3 mL at 10/18/18 1439  . vitamin B-12 (CYANOCOBALAMIN) tablet 500 mcg  500 mcg Oral Daily Cristal Deer, MD   500 mcg at 10/19/18 8677  . Warfarin - Pharmacist Dosing Inpatient   Does not apply [id number] Cristal Deer, MD         Discharge Medications: Please see discharge summary for a list of discharge medications.  Relevant Imaging Results:  Relevant Lab Results:   Additional Information SSN: [id number]  Estanislado Emms, LCSW

## 2018-10-20 DIAGNOSIS — I4821 Permanent atrial fibrillation: Secondary | ICD-10-CM

## 2018-10-20 LAB — BASIC METABOLIC PANEL
Anion gap: 11 (ref 5–15)
BUN: 39 mg/dL — ABNORMAL HIGH (ref 8–23)
CO2: 22 mmol/L (ref 22–32)
CREATININE: 2.11 mg/dL — AB (ref 0.44–1.00)
Calcium: 8.7 mg/dL — ABNORMAL LOW (ref 8.9–10.3)
Chloride: 106 mmol/L (ref 98–111)
GFR calc Af Amer: 24 mL/min — ABNORMAL LOW (ref >60)
GFR calc non Af Amer: 21 mL/min — ABNORMAL LOW (ref >60)
Glucose, Bld: 147 mg/dL — ABNORMAL HIGH (ref 70–99)
Potassium: 4.3 mmol/L (ref 3.5–5.1)
Sodium: 139 mmol/L (ref 135–145)

## 2018-10-20 LAB — PROTIME-INR
INR: 3.27
Prothrombin Time: 32.8 seconds — ABNORMAL HIGH (ref 11.4–15.2)

## 2018-10-20 LAB — CBC WITH DIFFERENTIAL/PLATELET
Abs Immature Granulocytes: 0.07 10*3/uL (ref 0.00–0.07)
Basophils Absolute: 0 10*3/uL (ref 0.0–0.1)
Basophils Relative: 0 %
Eosinophils Absolute: 0.2 10*3/uL (ref 0.0–0.5)
Eosinophils Relative: 2 %
HCT: 41.9 % (ref 36.0–46.0)
HEMOGLOBIN: 12.9 g/dL (ref 12.0–15.0)
Immature Granulocytes: 1 %
Lymphocytes Relative: 5 %
Lymphs Abs: 0.6 10*3/uL — ABNORMAL LOW (ref 0.7–4.0)
MCH: 27.1 pg (ref 26.0–34.0)
MCHC: 30.8 g/dL (ref 30.0–36.0)
MCV: 88 fL (ref 80.0–100.0)
Monocytes Absolute: 1.2 10*3/uL — ABNORMAL HIGH (ref 0.1–1.0)
Monocytes Relative: 10 %
Neutro Abs: 10.4 10*3/uL — ABNORMAL HIGH (ref 1.7–7.7)
Neutrophils Relative %: 82 %
Platelets: 291 10*3/uL (ref 150–400)
RBC: 4.76 MIL/uL (ref 3.87–5.11)
RDW: 19 % — ABNORMAL HIGH (ref 11.5–15.5)
WBC: 12.5 10*3/uL — ABNORMAL HIGH (ref 4.0–10.5)
nRBC: 0 % (ref 0.0–0.2)

## 2018-10-20 LAB — GLUCOSE, CAPILLARY: Glucose-Capillary: 137 mg/dL — ABNORMAL HIGH (ref 70–99)

## 2018-10-20 MED ORDER — DILTIAZEM HCL ER COATED BEADS 120 MG PO CP24: 120.0000 mg | ORAL_CAPSULE | Freq: Every day | ORAL | Status: DC

## 2018-10-20 MED ORDER — METOPROLOL SUCCINATE ER 50 MG PO TB24: 50.0000 mg | ORAL_TABLET | Freq: Every day | ORAL | Status: DC

## 2018-10-20 MED ORDER — METOPROLOL SUCCINATE ER 50 MG PO TB24
75.0000 mg | ORAL_TABLET | Freq: Every day | ORAL | Status: DC
  Filled 2018-10-20: qty 1

## 2018-10-20 MED ORDER — WARFARIN SODIUM 1 MG PO TABS
1.0000 mg | ORAL_TABLET | Freq: Once | ORAL | Status: AC
  Administered 2018-10-20: 1 mg via ORAL
  Filled 2018-10-20: qty 1

## 2018-10-20 NOTE — Clinical Social Work Note (Signed)
Spoke with daughter. She has toured [facility] and would like patient to discharge there. If no beds for extended period of time, [facility] is second preference. Spoke with admissions coordinator who stated they would not have a bed until next week at the earliest. [facility] will start insurance authorization. CSW called and notified daughter.  Dayton Scrape, [place]

## 2018-10-20 NOTE — Progress Notes (Signed)
PROGRESS NOTE    [name] [name]  VZD:[id number] DOB: [birth year] DOA: 10/18/2018 PCP: Hoyt Koch, MD   Brief Narrative: Patient is 85 year old female with history of atrial fibrillation on Coumadin, dementia, history of breast cancer status post left mastectomy, status post breast replacement who was brought by her family because she fell at home.  Reported to have been progressively weak for last 3 weeks. Also found to be dyspneaic. She was also confused.  Found to be in acute kidney injury on presentation.Admitted for further work-up.  Assessment & Plan:   Principal Problem:   Syncope Active Problems:   Acute lower UTI   History of pulmonary embolism   History of breast cancer   Pacemaker-Medtronic   Chronic diastolic (congestive) heart failure (HCC)   Dyspnea on exertion   Diabetes mellitus type 2, noninsulin dependent (HCC)   A-fib (HCC)   Cognitive change   CKD (chronic kidney disease), stage IV (HCC)   Solitary pulmonary nodule on lung CT   Fall at home, initial encounter   Pulmonary embolism on long-term anticoagulation therapy ([place])   Syncope and collapse  Fall: Sustained fall at home.  CT head did not show any acute intracranial abnormalities  C-spine CT negative for fracture. Physical therapy already evaluated the patient and recommended skilled nursing facility.  Social work consulted.  Combined Congestive heart failure/Dyspnea: History of diastolic CHF.  Echo done here showed ejection fraction of 45%,severe TR,  Moderate pulmonary HTN. Continue Toprol.  Not on ACE/ARB due to acute kidney injury/CKD.  Cardiology consulted today.  Urinary tract infection:Urine culture did not show any growth.  Antibiotics discontinued.  Acute kidney injury on CKD stage 3: Presented with creatinine of more than 2.  Baseline creatinine around 1.5 .Fluids stopped today.  Chronic atrial fibrillation: Found to be A. fib with RVR on presentation.  She was Started on Cardizem  drip.    We will stop Cardizem drip . Dose of  Metoprolol increased.  On anticoagulation with warfarin at home.  Use of warfarin on this elderly lady with falls is very risky.  INR is supratherapeutic so we will hold warfarin for now.   She follows with Dr. Rayann Heman  She needs to follow-up with him as an outpatient and he will decide on the discontinuation of warfarin .  Mild rhabdomyolysis: CK elevated to 499 on presentation.  Associated with fall.  C  Incidental finding of right thyroid nodule: On CT scan.  Patient is asymptomatic.  Due to her advanced age and dementia family does not want to pursue any further investigation.  Dementia: Patient is pleasantly demented.  Continue supportive care.  Continue Aricept.         DVT prophylaxis: Supratherapeutic INR Code Status: Full Family Communication: None present at the bedside Disposition Plan: Skilled nursing facility after cardiology evaluation   Consultants: None  Procedures:none  Antimicrobials:  Anti-infectives (From admission, onward)   Start     Dose/Rate Route Frequency Ordered Stop   10/18/18 1415  cefTRIAXone (ROCEPHIN) 1 g in sodium chloride 0.9 % 100 mL IVPB  Status:  Discontinued     1 g 200 mL/hr over 30 Minutes Intravenous Every 24 hours 10/18/18 1400 10/20/18 0814   10/18/18 1300  cefTRIAXone (ROCEPHIN) 1 g in sodium chloride 0.9 % 100 mL IVPB  Status:  Discontinued     1 g 200 mL/hr over 30 Minutes Intravenous  Once 10/18/18 1257 10/18/18 1258      Subjective: Patient seen and examined at bedside  this morning. Remains comfortable.  Heart rate in the range of 100-110 this morning. Discussed extensively with the daughter on phone.  Objective: Vitals:   10/19/18 2025 10/20/18 0353 10/20/18 0808 10/20/18 0915  BP: (!) 100/52 (!) 95/48 (!) 143/131 123/66  Pulse: 96 98 (!) 111 (!) 107  Resp: 17 18    Temp: 98.6 F (37 C) (!) 97 F (36.1 C)  98.2 F (36.8 C)  TempSrc: Oral   Oral  SpO2: 92% 95%  94%    Weight:      Height:        Intake/Output Summary (Last 24 hours) at 10/20/2018 1053 Last data filed at 10/20/2018 0800 Gross per 24 hour  Intake 2828.14 ml  Output 500 ml  Net 2328.14 ml   Filed Weights   10/18/18 1026 10/19/18 0511  Weight: 95.7 kg 99.8 kg    Examination:  General exam: Appears calm and comfortable ,Not in distress,elderly female  HEENT:PERRL,Oral mucosa moist, Ear/Nose normal on gross exam Respiratory system: Bilateral decreased air entry in the bases Cardiovascular system: Irregularly irregular. No JVD, murmurs, rubs, gallops or clicks. Gastrointestinal system: Abdomen is nondistended, soft and nontender. No organomegaly or masses felt. Normal bowel sounds heard. Central nervous system: Alert but not  oriented. No focal neurological deficits. Extremities: No edema, no clubbing ,no cyanosis, distal peripheral pulses palpable.    Data Reviewed: I have personally reviewed following labs and imaging studies  CBC: Recent Labs  Lab 10/18/18 1005 10/18/18 1016 10/19/18 0528 10/20/18 0438  WBC 9.1  --  11.5* 12.5*  NEUTROABS 8.0*  --   --  10.4*  HGB 14.5 16.7* 11.7* 12.9  HCT 46.9* 49.0* 37.9 41.9  MCV 87.2  --  87.9 88.0  PLT 351  --  286 017   Basic Metabolic Panel: Recent Labs  Lab 10/18/18 1005 10/18/18 1016 10/19/18 0528 10/20/18 0438  NA 135 137 138 139  K 4.3 4.2 3.9 4.3  CL 98 101 107 106  CO2 23  --  19* 22  GLUCOSE 195* 192* 181* 147*  BUN 40* 39* 37* 39*  CREATININE 2.52* 2.30* 2.06* 2.11*  CALCIUM 9.3  --  7.5* 8.7*   GFR: Estimated Creatinine Clearance: 25.8 mL/min (A) (by C-G formula based on SCr of 2.11 mg/dL (H)). Liver Function Tests: Recent Labs  Lab 10/18/18 1005  AST 44*  ALT 26  ALKPHOS 123  BILITOT 3.1*  PROT 7.7  ALBUMIN 2.6*   No results for input(s): LIPASE, AMYLASE in the last 168 hours. No results for input(s): AMMONIA in the last 168 hours. Coagulation Profile: Recent Labs  Lab 10/16/18 1605  10/18/18 1005 10/19/18 0528 10/20/18 0438  INR 3.0 2.61 3.81 3.27   Cardiac Enzymes: Recent Labs  Lab 10/18/18 1005 10/19/18 0528  CKTOTAL 499* 283*   BNP (last 3 results) No results for input(s): PROBNP in the last 8760 hours. HbA1C: No results for input(s): HGBA1C in the last 72 hours. CBG: Recent Labs  Lab 10/19/18 0733 10/20/18 0739  GLUCAP 137* 137*   Lipid Profile: No results for input(s): CHOL, HDL, LDLCALC, TRIG, CHOLHDL, LDLDIRECT in the last 72 hours. Thyroid Function Tests: No results for input(s): TSH, T4TOTAL, FREET4, T3FREE, THYROIDAB in the last 72 hours. Anemia Panel: No results for input(s): VITAMINB12, FOLATE, FERRITIN, TIBC, IRON, RETICCTPCT in the last 72 hours. Sepsis Labs: No results for input(s): PROCALCITON, LATICACIDVEN in the last 168 hours.  Recent Results (from the past 240 hour(s))  Urine culture  Status: None   Collection Time: 10/18/18 11:48 AM  Result Value Ref Range Status   Specimen Description URINE, RANDOM  Final   Special Requests   Final    NONE Performed at [facility], [address], [place], [place] [address]    Culture NO GROWTH  Final   Report Status 10/19/2018 FINAL  Final         Radiology Studies: Dg Chest 1 View  Result Date: 10/18/2018 CLINICAL DATA:  Dyspnea EXAM: CHEST  1 VIEW COMPARISON:  05/20/2018 FINDINGS: Diffuse bilateral mild interstitial thickening. No pleural effusion or pneumothorax. Stable cardiomegaly. Dual lead cardiac pacemaker. No acute osseous abnormality. Small hiatal hernia. Surgical clips in the left axilla. IMPRESSION: Findings concerning for mild CHF. Electronically Signed   By: Kathreen Devoid   On: 10/18/2018 16:54   Dg Chest Port 1 View  Result Date: 10/18/2018 CLINICAL DATA:  Status post fall.  No chest pain EXAM: PORTABLE CHEST 1 VIEW COMPARISON:  05/20/2018 FINDINGS: Bilateral diffuse interstitial thickening. No pleural effusion or pneumothorax. Stable cardiomegaly. Dual lead  cardiac pacemaker. No acute osseous abnormality. IMPRESSION: Cardiomegaly with mild pulmonary vascular congestion. Electronically Signed   By: Kathreen Devoid   On: 10/18/2018 12:59        Scheduled Meds: . bethanechol  25 mg Oral BID  . memantine  5 mg Oral BID  . [START ON 10/21/2018] metoprolol succinate  50 mg Oral Daily  . pregabalin  75 mg Oral QHS  . sodium chloride flush  3 mL Intravenous Q12H  . vitamin B-12  500 mcg Oral Daily  . warfarin  1 mg Oral ONCE-1800  . Warfarin - Pharmacist Dosing Inpatient   Does not apply q1800   Continuous Infusions:    LOS: 2 days    Time spent:35 mins. More than 50% of that time was spent in counseling and/or coordination of care.      Shelly Coss, MD [facility] Pager [phone]  If 7PM-7AM, please contact night-coverage [email or web address] Password [facility] 10/20/2018, 10:53 AM

## 2018-10-20 NOTE — Progress Notes (Signed)
ANTICOAGULATION CONSULT NOTE -follow up Pharmacy Consult for Coumadin Indication: atrial fibrillation and history of PE (2006)  Allergies  Allergen Reactions  . Amiodarone Shortness Of Breath and Nausea Only    "Toxicity," also  . Flecainide Shortness Of Breath and Other (See Comments)    "Toxicity," also   . Morphine And Related Nausea And Vomiting and Other (See Comments)    Family requests no morphine due to past reaction. Pt stated it made her sick, severe nausea and vomiting  . Bactrim Ds [Sulfamethoxazole-Trimethoprim] Diarrhea  . Cephalexin Nausea And Vomiting  . Pineapple Itching    Severe itching    Patient Measurements: Height: 6' (182.9 cm) Weight: 220 lb (99.8 kg) IBW/kg (Calculated) : 73.1  Vital Signs: Temp: 98.2 F (36.8 C) (01/21 0915) Temp Source: Oral (01/21 0915) BP: 123/66 (01/21 0915) Pulse Rate: 107 (01/21 0915)  Labs: Recent Labs    10/18/18 1005 10/18/18 1016 10/19/18 0528 10/20/18 0438  HGB 14.5 16.7* 11.7* 12.9  HCT 46.9* 49.0* 37.9 41.9  PLT 351  --  286 291  APTT  --   --  49*  --   LABPROT 27.5*  --  36.9* 32.8*  INR 2.61  --  3.81 3.27  CREATININE 2.52* 2.30* 2.06* 2.11*  CKTOTAL 499*  --  283*  --     Estimated Creatinine Clearance: 25.8 mL/min (A) (by C-G formula based on SCr of 2.11 mg/dL (H)).   Medical History: Past Medical History:  Diagnosis Date  . Acute gastric ulcer without mention of hemorrhage, perforation, or obstruction 2006   EGD  . Anxiety   . Atrial fibrillation (HCC)    flecainide; coumadin  . Breast cancer ([place])    left  . Carotid artery disease ([place])    64/40: RICA 3-47%, LICA 42-59%  //  [place] 56/38: RICA 7-56%; LICA 43-32% >> FU 1 year   . Chronic diastolic heart failure (HCC)    a. echo 10/10: mild LVH, EF 55-60%, mild AI, mild MR, mild to mod LAE, mild RVE, severe RAE, mod TR, PASP 53  /  b.  Echo 3/14:  Mild LVH, EF 55%, Tr AI, MAC, mild MR, mod LAE, PASP 31, trivial eff. // c. echo 1/17: EF  55-60%, mild AI, MAC, mild MR, moderate BAE, moderate TR  . Chronic lower back pain   . Diabetic peripheral neuropathy ([place]) 03/09/2015  . Diverticulosis   . Esophagitis, unspecified 2006   EGD  . Hiatal hernia 2006   EGD  . History of nuclear stress test    a. Myoview 2/12: EF 69%, no scar or ischemia  . Hx of adenomatous colonic polyps   . Hypertension   . Mediastinal lymphadenopathy   . Memory difficulty 11/14/2016  . Mitral regurgitation    mild, echo, October, 2010  . Osteopenia   . Presence of permanent cardiac pacemaker   . Pulmonary embolus [facility]) 2006   2006, with DVT  . Pulmonary HTN ([place])    53 mmHg echo, 2010, moderate TR, mild right ventricular enlargement  . Tachy-brady syndrome ([place])    s/p pacer 07/2009  . Thyroid nodule    non-neoplastic goiter  . Type II diabetes mellitus ([place])   . Venous insufficiency    Chronic   Assessment: 85 year old female found down at home. Patient was on chronic Coumadin therapy for atrial fibrillation and history of a PE (2006). INR on admission is therapeutic at 2.61. No overt bleeding noted on admission. Head CT  was negative for bleeding.   Pharmacy was consulted 10/18/18 to resume Coumadin therapy.   Home regimen was 2.55m every Monday and 1.25 all other days. Patient's last dose PTA was on 10/17/18 in the PM.   Today INR = 3.27, still supratherapeutic but has decreased from  <3.81 yesterday.   CBC wnl. No bleeding noted.  Warfarin held 10/19/18 thus anticipate INR to trend down within therapeutic range tomorrow. Will give warfarin dose lower than PTA dose.    Goal of Therapy:  INR 2-3 Monitor platelets by anticoagulation protocol: Yes   Plan:  Give Coumadin 1 mg today. Daily PT/INR  RNicole Cella RPh Clinical Pharmacist [phone] check AMION for all [place]phone numbers After 10:00 PM, call [phone]/21/2020,10:23 AM

## 2018-10-20 NOTE — Progress Notes (Signed)
Physical Therapy Treatment Patient Details Name: [name] [name] MRN: [id number] DOB: 1931-05-09 Today's Date: 10/20/2018    History of Present Illness Pt is an 85 y.o. female admitted 10/18/18 after falling at home, found down by family; reports progressive weakness and DOE. Found to be severely dehydrated with UTI; mild rhabdomyolysis. Head and neck CT negative for acute abnormality. PMH includes afib (on Coumadin), dementia, pacemaker, breast CA.    PT Comments    Pt with continued fatigue/lethargy; per nursing, has been asleep majority of day. Currently requiring maxA+2 to [place] for bed mobility this session; able to achieve partial sitting with maxA, pt with heavy reliance on UE support but limited by c/o back and arm pain. Recommend use of maxisky for OOB transfers with nursing staff if pt remains lethargic.     Follow Up Recommendations  SNF;Supervision for mobility/OOB     Equipment Recommendations  None recommended by PT    Recommendations for Other Services       Precautions / Restrictions Precautions Precautions: Fall Restrictions Weight Bearing Restrictions: No    Mobility  Bed Mobility Overal bed mobility: Needs Assistance Bed Mobility: Rolling;Supine to Sit Rolling: Max assist;+2 for physical assistance   Supine to sit: Total assist     General bed mobility comments: TotalA to roll R/L. Pt able to initiate BLE movement to EOB and reach to hand rails with cues, but resistent when assist given; totalA for repositioning in bed and scooting up towards [facility]  Transfers                 General transfer comment: NT- may plan for transfer with maximove if pt remains fatigued/needing significant assist  Ambulation/Gait                 Stairs             Wheelchair Mobility    Modified Rankin (Stroke Patients Only)       Balance                                            Cognition Arousal/Alertness:  Lethargic Behavior During Therapy: Flat affect Overall Cognitive Status: History of cognitive impairments - at baseline                                 General Comments: Pt opening eyes and answering questions, able to speak up and not mumble when cued, but still keeping eyes closed majority of session. Would initiate movement with max cues      Exercises Other Exercises Other Exercises: PROM of bilateral knees and bilateral shoulder flexion/abd; after this, pt able to reach ~150' R shoulder flexion, but L shoulder limited by pain. Able to perform ankle pumps    General Comments        Pertinent Vitals/Pain Pain Assessment: Faces Faces Pain Scale: Hurts little more Pain Location: Back, legs, arms... generalized with any movement Pain Descriptors / Indicators: Moaning;Grimacing;Guarding Pain Intervention(s): Limited activity within patient's tolerance;Monitored during session    Home Living                      Prior Function            PT Goals (current goals can now be found in the care plan section) Acute Rehab PT  Goals Patient Stated Goal: None stated PT Goal Formulation: With patient Time For Goal Achievement: 11/02/18 Progress towards PT goals: Not progressing toward goals - comment(Limited by fatigue)    Frequency    Min 2X/week      PT Plan Current plan remains appropriate    Co-evaluation              AM-PAC PT "6 Clicks" Mobility   Outcome Measure  Help needed turning from your back to your side while in a flat bed without using bedrails?: A Lot Help needed moving from lying on your back to sitting on the side of a flat bed without using bedrails?: Total Help needed moving to and from a bed to a chair (including a wheelchair)?: Total Help needed standing up from a chair using your arms (e.g., wheelchair or bedside chair)?: Total Help needed to walk in hospital room?: Total Help needed climbing 3-5 steps with a railing? :  Total 6 Click Score: 7    End of Session   Activity Tolerance: Patient limited by fatigue Patient left: in bed;with call bell/phone within reach;with bed alarm set Nurse Communication: Mobility status PT Visit Diagnosis: Other abnormalities of gait and mobility (R26.89)     Time: 1443-1540 PT Time Calculation (min) (ACUTE ONLY): 15 min  Charges:  $Therapeutic Activity: 8-22 mins                    Mabeline Caras, PT, DPT Acute Rehabilitation Services  Pager [phone] Office [phone]  Derry Lory 10/20/2018, 4:48 PM

## 2018-10-20 NOTE — Consult Note (Addendum)
Cardiology Consultation:   Patient ID: [name] [name] MRN: [id number]; DOB: [birth year]  Admit date: 10/18/2018 Date of Consult: 10/20/2018  Primary Care Provider: Hoyt Koch, MD Primary Cardiologist: Thompson Grayer, MD  Primary Electrophysiologist:  Thompson Grayer, MD   Patient Profile:   [name] [name] is a 85 y.o. female with a hx of dementia, chronic diastolic CHF, chronic atrial fibrillation on warfarin, history of DVT, hypertension, CKD stage III, diabetes type 2, SSS s/p PPM 2010, chronic venous insufficiency pulmonary hypertension, hiatal hernia, breast cancer s/p left mastectomy and anxiety who is being seen today for the evaluation of CHF at the request of Dr. Tawanna Solo.  History of Present Illness:   Ms. Mulhall was brought to the hospital on 10/18/2017 after a fall.  It was reported that the patient had been getting progressively weaker over the last 3 weeks.  Apparently she was living on her own and had fallen, later found by her daughter after being down for some time. She was also found to be dyspneic and confused.  He was found to have mild rhabdomyolysis.    She was seen most recently on 08/18/2018 in our office by Tommye Standard, PA and noted to have dyspnea on exertion, likely multifactorial, not new, related to advancing age, deconditioning, possible emphysema.  Her condition was discussed with the patient and her daughter regarding a more conservative approach and avoiding cardiac procedures.  They were advised to pursue home physical therapy.   The patient was in A. fib with RVR on presentation.  Initially Cardizem drip was started but then stopped.  At home the patient is on Toprol-XL 75 mg daily.  Currently she is on 25 mg daily.  An echocardiogram was done and showed EF 45% with severe TR, moderate pulmonary hypertension.   She was started on IV antibiotics for UTI however urine culture showed no growth and antibiotic discontinued today.  Urine is still amber and  cloudy.  On my examination the patient is sleeping but arouses easily, alert, oriented to self only.  She is pleasantly confused and denies any discomfort, chest pain, shortness of breath, palpitations.  She has possibly very mild JVD, not significantly.   She has been evaluated by physical therapy with recommendation for SNF placement which is in process.   Troponin negative. Chest x-ray on 10/18/2018 showed cardiomegaly with mild pulmonary vascular congestion Serum creatinine was elevated at 2.52, trended down to 2.06 yesterday and then up slightly to 2.11 today  Device information: MDT dual chamber PPM, implanted 06/30/09, SSSx, Dr. Rayann Heman AF hx: AAD amiodarone, started Jan 2018 w/episode rapid AFlutter Hx of Flecainide d/c 2/2 CHF and marked PR prolongation to >461m Amio stopped June 2018 concerns of pulm tox  Past Medical History:  Diagnosis Date  . Acute gastric ulcer without mention of hemorrhage, perforation, or obstruction 2006   EGD  . Anxiety   . Atrial fibrillation (HCC)    flecainide; coumadin  . Breast cancer ([place]    left  . Carotid artery disease ([place]    102/58 RICA 05-27% LICA 478-24% //  [place]/53 RICA 16-14% LICA 643-15%>> FU 1 year   . Chronic diastolic heart failure (HCC)    a. echo 10/10: mild LVH, EF 55-60%, mild AI, mild MR, mild to mod LAE, mild RVE, severe RAE, mod TR, PASP 53  /  b.  Echo 3/14:  Mild LVH, EF 55%, Tr AI, MAC, mild MR, mod LAE, PASP 31, trivial eff. // c. echo 1/17:  EF 55-60%, mild AI, MAC, mild MR, moderate BAE, moderate TR  . Chronic lower back pain   . Diabetic peripheral neuropathy ([place]) 03/09/2015  . Diverticulosis   . Esophagitis, unspecified 2006   EGD  . Hiatal hernia 2006   EGD  . History of nuclear stress test    a. Myoview 2/12: EF 69%, no scar or ischemia  . Hx of adenomatous colonic polyps   . Hypertension   . Mediastinal lymphadenopathy   . Memory difficulty 11/14/2016  . Mitral regurgitation    mild, echo,  October, 2010  . Osteopenia   . Presence of permanent cardiac pacemaker   . Pulmonary embolus [facility]) 2006   2006, with DVT  . Pulmonary HTN ([place])    53 mmHg echo, 2010, moderate TR, mild right ventricular enlargement  . Tachy-brady syndrome ([place])    s/p pacer 07/2009  . Thyroid nodule    non-neoplastic goiter  . Type II diabetes mellitus ([place])   . Venous insufficiency    Chronic    Past Surgical History:  Procedure Laterality Date  . ABDOMINAL HYSTERECTOMY    . ANKLE FRACTURE SURGERY Right   . BACK SURGERY    . BREAST LUMPECTOMY Left   . FRACTURE SURGERY    . INSERT / REPLACE / REMOVE PACEMAKER  06/30/2009   MDT Adalpta L implanted by Dr Olevia Perches  . LUMBAR LAMINECTOMY  1973, 06/22/2011   "no hardware either time"     Home Medications:  Prior to Admission medications   Medication Sig Start Date End Date Taking? Authorizing Provider  acetaminophen (TYLENOL) 325 MG tablet Take 650 mg by mouth every 6 (six) hours as needed (back pain).    Yes [provider]  bethanechol (URECHOLINE) 25 MG tablet Take 25 mg by mouth 2 (two) times daily.   Yes [provider]  furosemide (LASIX) 80 MG tablet TAKE 1 TABLET (80 MG TOTAL) BY MOUTH 2 (TWO) TIMES DAILY. 01/13/18  Yes Isaiah Serge, NP  memantine (NAMENDA) 5 MG tablet Take 1 tablet (5 mg total) by mouth 2 (two) times daily. 07/23/18  Yes Kathrynn Ducking, MD  metoprolol succinate (TOPROL-XL) 25 MG 24 hr tablet Take 3 tablets (75 mg total) by mouth daily. 09/14/18  Yes Hoyt Koch, MD  potassium chloride 20 MEQ/15ML (10%) SOLN Take 20 mEq by mouth daily as needed (if have not eaten a banana).   Yes [provider]  pregabalin (LYRICA) 75 MG capsule Take 1 capsule (75 mg total) by mouth at bedtime. 08/12/18  Yes Kathrynn Ducking, MD  promethazine (PHENERGAN) 25 MG tablet Take 1 tablet (25 mg total) by mouth every 8 (eight) hours as needed for nausea or vomiting. 10/22/16  Yes Hoyt Koch, MD    vitamin B-12 (CYANOCOBALAMIN) 500 MCG tablet Take 500 mcg by mouth daily.    Yes [provider]  warfarin (COUMADIN) 2.5 MG tablet TAKE 1/2 TABLET TO 1 TABLET DAILY OR AS DIRECTED BY COUMADIN CLINIC Patient taking differently: Take 1.25-2.5 mg by mouth See admin instructions. Take 1.25 mg tablet daily EXCEPT 2.5 mg tablet on Mondays. 06/30/18  Yes Dorothy Spark, MD  blood glucose meter kit and supplies KIT Dispense based on patient and insurance preference. Use before breakfast and at bedtime. Diagnosis code: E11.9 03/26/17   NcheCharlene Brooke, NP  triamcinolone cream (KENALOG) 0.1 % Apply 1 application topically 2 (two) times daily. Patient not taking: Reported on 10/18/2018 05/08/18   Hoyt Koch,  MD    Inpatient Medications: Scheduled Meds: . bethanechol  25 mg Oral BID  . memantine  5 mg Oral BID  . [START ON 10/21/2018] metoprolol succinate  50 mg Oral Daily  . pregabalin  75 mg Oral QHS  . sodium chloride flush  3 mL Intravenous Q12H  . vitamin B-12  500 mcg Oral Daily  . warfarin  1 mg Oral ONCE-1800  . Warfarin - Pharmacist Dosing Inpatient   Does not apply q1800   Continuous Infusions:  PRN Meds: acetaminophen, dimethicone, senna-docusate  Allergies:    Allergies  Allergen Reactions  . Amiodarone Shortness Of Breath and Nausea Only    "Toxicity," also  . Flecainide Shortness Of Breath and Other (See Comments)    "Toxicity," also   . Morphine And Related Nausea And Vomiting and Other (See Comments)    Family requests no morphine due to past reaction. Pt stated it made her sick, severe nausea and vomiting  . Bactrim Ds [Sulfamethoxazole-Trimethoprim] Diarrhea  . Cephalexin Nausea And Vomiting  . Pineapple Itching    Severe itching    Social History:   Social History   Socioeconomic History  . Marital status: Widowed    Spouse name: Not on file  . Number of children: 3  . Years of education: Not on file  . Highest education level: Not on  file  Occupational History  . Occupation: Retired   Scientific laboratory technician  . Financial resource strain: Not on file  . Food insecurity:    Worry: Not on file    Inability: Not on file  . Transportation needs:    Medical: Not on file    Non-medical: Not on file  Tobacco Use  . Smoking status: Former Smoker    Packs/day: 1.00    Years: 47.00    Pack years: 47.00    Types: Cigarettes    Last attempt to quit: 10/26/1998    Years since quitting: 19.9  . Smokeless tobacco: Never Used  Substance and Sexual Activity  . Alcohol use: No    Comment: 10/14/2016 "I'll have a beer q once in awhile"  . Drug use: No  . Sexual activity: Not Currently  Lifestyle  . Physical activity:    Days per week: Not on file    Minutes per session: Not on file  . Stress: Not on file  Relationships  . Social connections:    Talks on phone: Not on file    Gets together: Not on file    Attends religious service: Not on file    Active member of club or organization: Not on file    Attends meetings of clubs or organizations: Not on file    Relationship status: Not on file  . Intimate partner violence:    Fear of current or ex partner: Not on file    Emotionally abused: Not on file    Physically abused: Not on file    Forced sexual activity: Not on file  Other Topics Concern  . Not on file  Social History Narrative   Widowed.  Lives in [place] by herself.  Avoids caffeine.  Very active, taking care of her brother who also lives by himself and is in his late 80's.    Family History:    Family History  Problem Relation Age of Onset  . Heart disease Mother   . Hypertension Mother   . COPD Father   . Hypertension Father   . Hypertension Sister   . Hypertension  Brother   . Hypertension Daughter   . Colon cancer Neg Hx   . Heart attack Neg Hx   . Stroke Neg Hx      ROS:  Please see the history of present illness.  Unable to obtain full review of systems due to dementia in this patient  Physical Exam/Data:    Vitals:   10/20/18 0808 10/20/18 0915 10/20/18 1247 10/20/18 1600  BP: (!) 143/131 123/66 124/85 119/79  Pulse: (!) 111 (!) 107 (!) 111 (!) 114  Resp:   18   Temp:  98.2 F (36.8 C) 99.2 F (37.3 C) 98.1 F (36.7 C)  TempSrc:  Oral Oral Oral  SpO2:  94% 98% 96%  Weight:      Height:        Intake/Output Summary (Last 24 hours) at 10/20/2018 1703 Last data filed at 10/20/2018 0800 Gross per 24 hour  Intake 2468.14 ml  Output 450 ml  Net 2018.14 ml   Last 3 Weights 10/19/2018 10/18/2018 09/02/2018  Weight (lbs) 220 lb 211 lb 221 lb  Weight (kg) 99.791 kg 95.709 kg 100.245 kg     Body mass index is 29.84 kg/m.  General: Frail elderly female, in no acute distress HEENT: normal Lymph: no adenopathy Neck: 0.5 cm JVD Endocrine:  No thryomegaly Vascular: No carotid bruits; pedal pulses 2+  Cardiac:  normal S1, S2; irregularly irregular rhythm; no murmur Lungs:  clear to auscultation bilaterally, no wheezing, rhonchi or rales  Abd: soft, nontender, no hepatomegaly  Ext: Trace lower leg edema Musculoskeletal:  No deformities, BUE and BLE strength normal and equal Skin: warm and dry  Neuro: Patient oriented to self only, pleasantly confused Psych:  Normal affect   EKG:  The EKG was personally reviewed and demonstrates: Atrial fibrillation, 129 bpm Telemetry:  Telemetry was personally reviewed and demonstrates: A. fib in the 100s-110s  Relevant CV Studies:  Echocardiogram 10/19/2018 Study Conclusions - Left ventricle: The cavity size was normal. Wall thickness was   normal. The estimated ejection fraction was 45%. Images were   inadequate for LV wall motion assessment. The study was not   technically sufficient to allow evaluation of LV diastolic   dysfunction due to atrial fibrillation. - Ventricular septum: Mildly D-shaped interventricular septum   suggesting RV pressure/volume overload. - Aortic valve: There was no stenosis. There was trivial   regurgitation. - Mitral  valve: Moderately calcified annulus. Mildly calcified   leaflets . There was mild regurgitation. - Left atrium: The atrium was moderately dilated. - Right ventricle: The cavity size was moderately dilated. Pacer   wire or catheter noted in right ventricle. Systolic function was   mildly reduced. - Right atrium: The atrium was severely dilated. - Tricuspid valve: There was severe regurgitation. Peak RV-RA   gradient (S): 37 mm Hg. - Pulmonary arteries: PA peak pressure: 52 mm Hg (S). - Systemic veins: IVC measured 2.7 cm with < 50% respirophasic   variation, suggesting RA pressure 15 mmHg. - Pericardium, extracardiac: A trivial pericardial effusion was   identified.  Impressions: - The patient was in atrial fibrillation. Normal LV size with EF   45%. LV not well visualized, unable to comment well on individual   wall segments. Moderately dilated RV with mildly decreased   systolic function. D-shaped interventricular septum suggesting RV   pressure/volume overload. Severe TR. Moderate pulmonary   hypertension. Dilated IVC suggestive of elevated RV filling   pressure. Mild MR.  Echo 7/51/0258: Overall systolic function appeared reduced but  poor quality images inadequate for LV wall motion assessment and EF.  Mild AI, mild-mod MR, mild TR   06/17/16: Gated myoview Study Highlights   Nuclear stress EF: 69%.  There was no ST segment deviation noted during stress.  Defect 1: There is a small defect of mild severity present in the basal inferolateral and mid inferolateral location. This defect is partially reversible and could represent a very small area of ischemia. Clinical correlation recommended.  This is a low risk study.  The left ventricular ejection fraction is hyperdynamic (>65%).    ECHO 04/13/16: EF 55-60%, No RWMA, mild AI, Mild MR, Mod TR   Laboratory Data:  Chemistry Recent Labs  Lab 10/18/18 1005 10/18/18 1016 10/19/18 0528 10/20/18 0438  NA 135 137 138  139  K 4.3 4.2 3.9 4.3  CL 98 101 107 106  CO2 23  --  19* 22  GLUCOSE 195* 192* 181* 147*  BUN 40* 39* 37* 39*  CREATININE 2.52* 2.30* 2.06* 2.11*  CALCIUM 9.3  --  7.5* 8.7*  GFRNONAA 17*  --  21* 21*  GFRAA 19*  --  25* 24*  ANIONGAP 14  --  12 11    Recent Labs  Lab 10/18/18 1005  PROT 7.7  ALBUMIN 2.6*  AST 44*  ALT 26  ALKPHOS 123  BILITOT 3.1*   Hematology Recent Labs  Lab 10/18/18 1005 10/18/18 1016 10/19/18 0528 10/20/18 0438  WBC 9.1  --  11.5* 12.5*  RBC 5.38*  --  4.31 4.76  HGB 14.5 16.7* 11.7* 12.9  HCT 46.9* 49.0* 37.9 41.9  MCV 87.2  --  87.9 88.0  MCH 27.0  --  27.1 27.1  MCHC 30.9  --  30.9 30.8  RDW 18.7*  --  18.8* 19.0*  PLT 351  --  286 291   Cardiac EnzymesNo results for input(s): TROPONINI in the last 168 hours.  Recent Labs  Lab 10/18/18 1014  TROPIPOC 0.05    BNPNo results for input(s): BNP, PROBNP in the last 168 hours.  DDimer No results for input(s): DDIMER in the last 168 hours.  Radiology/Studies:  Dg Chest 1 View  Result Date: 10/18/2018 CLINICAL DATA:  Dyspnea EXAM: CHEST  1 VIEW COMPARISON:  05/20/2018 FINDINGS: Diffuse bilateral mild interstitial thickening. No pleural effusion or pneumothorax. Stable cardiomegaly. Dual lead cardiac pacemaker. No acute osseous abnormality. Small hiatal hernia. Surgical clips in the left axilla. IMPRESSION: Findings concerning for mild CHF. Electronically Signed   By: Kathreen Devoid   On: 10/18/2018 16:54   Ct Head Wo Contrast  Result Date: 10/18/2018 CLINICAL DATA:  Fall EXAM: CT HEAD WITHOUT CONTRAST CT CERVICAL SPINE WITHOUT CONTRAST TECHNIQUE: Multidetector CT imaging of the head and cervical spine was performed following the standard protocol without intravenous contrast. Multiplanar CT image reconstructions of the cervical spine were also generated. COMPARISON:  CT head 05/11/2018 FINDINGS: CT HEAD FINDINGS Brain: Moderate atrophy and moderate to severe chronic microvascular ischemic change  in the white matter. Negative for acute infarct, hemorrhage, mass. No midline shift. Vascular: Atherosclerotic calcification. Negative for hyperdense vessel Skull: Negative for skull fracture Sinuses/Orbits: Negative Other: None CT CERVICAL SPINE FINDINGS Alignment: Normal Skull base and vertebrae: Negative for fracture Soft tissues and spinal canal: 11 x 16 mm posterior right thyroid nodule. Additional right anterior thyroid nodule approximately 10 mm. No adenopathy. Atherosclerotic calcification aortic arch and carotid arteries. Disc levels: Moderate to advanced disc degeneration and spondylosis C3 through C7 causing spinal and foraminal stenosis. Upper  chest: Apical pleuroparenchymal scarring bilaterally. No acute abnormality Other: None IMPRESSION: 1. No acute intracranial abnormality. Moderate atrophy and moderate to severe chronic microvascular ischemia 2. Advanced cervical spondylosis.  Negative for cervical fracture 3. Right thyroid nodules. Electronically Signed   By: Franchot Gallo M.D.   On: 10/18/2018 10:47   Ct Cervical Spine Wo Contrast  Result Date: 10/18/2018 CLINICAL DATA:  Fall EXAM: CT HEAD WITHOUT CONTRAST CT CERVICAL SPINE WITHOUT CONTRAST TECHNIQUE: Multidetector CT imaging of the head and cervical spine was performed following the standard protocol without intravenous contrast. Multiplanar CT image reconstructions of the cervical spine were also generated. COMPARISON:  CT head 05/11/2018 FINDINGS: CT HEAD FINDINGS Brain: Moderate atrophy and moderate to severe chronic microvascular ischemic change in the white matter. Negative for acute infarct, hemorrhage, mass. No midline shift. Vascular: Atherosclerotic calcification. Negative for hyperdense vessel Skull: Negative for skull fracture Sinuses/Orbits: Negative Other: None CT CERVICAL SPINE FINDINGS Alignment: Normal Skull base and vertebrae: Negative for fracture Soft tissues and spinal canal: 11 x 16 mm posterior right thyroid nodule.  Additional right anterior thyroid nodule approximately 10 mm. No adenopathy. Atherosclerotic calcification aortic arch and carotid arteries. Disc levels: Moderate to advanced disc degeneration and spondylosis C3 through C7 causing spinal and foraminal stenosis. Upper chest: Apical pleuroparenchymal scarring bilaterally. No acute abnormality Other: None IMPRESSION: 1. No acute intracranial abnormality. Moderate atrophy and moderate to severe chronic microvascular ischemia 2. Advanced cervical spondylosis.  Negative for cervical fracture 3. Right thyroid nodules. Electronically Signed   By: Franchot Gallo M.D.   On: 10/18/2018 10:47   Dg Chest Port 1 View  Result Date: 10/18/2018 CLINICAL DATA:  Status post fall.  No chest pain EXAM: PORTABLE CHEST 1 VIEW COMPARISON:  05/20/2018 FINDINGS: Bilateral diffuse interstitial thickening. No pleural effusion or pneumothorax. Stable cardiomegaly. Dual lead cardiac pacemaker. No acute osseous abnormality. IMPRESSION: Cardiomegaly with mild pulmonary vascular congestion. Electronically Signed   By: Kathreen Devoid   On: 10/18/2018 12:59    Assessment and Plan:   Acute on chronic combined systolic and diastolic CHF -Patient admitted after a fall.  Mild rhabdo after bine down for a time. Found to be in A. fib with RVR and dyspneic. -Patient has had chronic diastolic CHF and now will be systolic function appears to be reduced at about 45%.  It appeared reduced by echo in 04/2018 but the study was too poor to estimate the EF. -Patient continues on her home Toprol-XL, was 75 mg at home.  Here she is on 25 mg with increased to 50 mg starting tomorrow.  Blood pressure has been mostly in the 90s to 110s, however up to 124/85 today. -Not on ACE/ARB due to CKD -Decline in LV function may be related to permanent atrial fibrillation.  Patient was not felt to be a candidate for ablation and due to severely dilated atria, rate control strategy has been elected. -Patient does not  appear significantly volume overloaded.  -Resume home Toprol Dose of 75 mg tomorrow.   Permanent atrial fibrillation -Amiodarone was discontinued in 02/2017 for concerns of pulmonary toxicity.  Flecainide was stopped in the past due to CHF and marked PR prolongation.  She has been rate controlled on Toprol-XL 75 mg daily -She has been anticoagulated with warfarin for CHA2Ds2Vasc is 5 (also has remote history of DVT). Pt is to be placed in SNF so should be OK to continue anticoagulation.   Acute on chronic kidney disease stage III -Serum creatinine levels over the last year  have ranged 1.6- 1.9 -Serum creatinine was elevated at 2.52 on admission, trended down to 2.06 yesterday and then up slightly to 2.11 today -No diuretic for now.   Hypertension -BP initially soft, improved now. Will increase her Toprol to home dose of 75 mg starting tomorrow.   Presence of permanent pacemaker -Placed for sick sinus syndrome, followed by Dr. Rayann Heman  UTI -UA was suspicious and Abx started, but culture with no growth. Abx dc's. Urine still appears dark and cloudy.     For questions or updates, please contact [place] Please consult [email or web address] for contact info under   Signed, Daune Perch, NP  10/20/2018 5:03 PM  History and all data above reviewed.  Patient examined.  I agree with the findings as above.  Patient has no memory of the events above.  Confused about place.  She has been updated in time.  Call syncope.  She denies palpitations.  She is denying shortness of breath or chest pain.  This was apparently not a witnessed event.  She does not report any falls or syncope.  She is noted to have a slightly reduced direct ejection fraction compared with previous.  Creatinine is elevated.  The patient exam reveals [id number]  ,  Lungs: Decreased breath sounds without crackles  ,  Abd: Positive bowel sounds, no rebound no guarding, Ext Mild lower extremity edema  .  All available labs, radiology  testing, previous records reviewed. Agree with documented assessment and plan.  Atrial fib: My suggestion at this point would be to continue the warfarin if she is going to be placed.  If so then her fall risk will be much reduced.  Her risk of stroke without anticoagulation is high.  Rate is elevated at present and will manage this by resuming her previous beta-blocker dose tomorrow.  Cardiomyopathy: Her EF is slightly low.  However, she does not seem to be significantly volume overloaded at this point.  Agree with holding diuretics given her elevated creatinine.  Is probably some element of intravascular volume depletion as she was down for some time on her floor and attended to.  Also be some slight rhabdo.  Given her progressive decline as noted in previous office notes frail condition no invasive or other evaluation is indicated.  We will titrate medications going forward as her heart rate blood pressure and creatinine allow.  At this point medication management is limited.  UTI: Is probably attributes to her acute on chronic decline.  This is being managed by the primary team.  Minus Breeding  5:59 PM  10/20/2018

## 2018-10-21 DIAGNOSIS — R55 Syncope and collapse: Secondary | ICD-10-CM

## 2018-10-21 DIAGNOSIS — I482 Chronic atrial fibrillation, unspecified: Secondary | ICD-10-CM

## 2018-10-21 LAB — BASIC METABOLIC PANEL
Anion gap: 9 (ref 5–15)
BUN: 38 mg/dL — ABNORMAL HIGH (ref 8–23)
CO2: 19 mmol/L — ABNORMAL LOW (ref 22–32)
Calcium: 8.6 mg/dL — ABNORMAL LOW (ref 8.9–10.3)
Chloride: 110 mmol/L (ref 98–111)
Creatinine, Ser: 2.01 mg/dL — ABNORMAL HIGH (ref 0.44–1.00)
GFR calc Af Amer: 26 mL/min — ABNORMAL LOW (ref >60)
GFR calc non Af Amer: 22 mL/min — ABNORMAL LOW (ref >60)
Glucose, Bld: 148 mg/dL — ABNORMAL HIGH (ref 70–99)
Potassium: 3.9 mmol/L (ref 3.5–5.1)
Sodium: 138 mmol/L (ref 135–145)

## 2018-10-21 LAB — CBC WITH DIFFERENTIAL/PLATELET
ABS IMMATURE GRANULOCYTES: 0.11 10*3/uL — AB (ref 0.00–0.07)
BASOS PCT: 1 %
Basophils Absolute: 0.1 10*3/uL (ref 0.0–0.1)
Eosinophils Absolute: 0.1 10*3/uL (ref 0.0–0.5)
Eosinophils Relative: 1 %
HCT: 39 % (ref 36.0–46.0)
Hemoglobin: 12.2 g/dL (ref 12.0–15.0)
Immature Granulocytes: 1 %
Lymphocytes Relative: 5 %
Lymphs Abs: 0.5 10*3/uL — ABNORMAL LOW (ref 0.7–4.0)
MCH: 26.8 pg (ref 26.0–34.0)
MCHC: 31.3 g/dL (ref 30.0–36.0)
MCV: 85.5 fL (ref 80.0–100.0)
Monocytes Absolute: 1.1 10*3/uL — ABNORMAL HIGH (ref 0.1–1.0)
Monocytes Relative: 10 %
NEUTROS ABS: 8.3 10*3/uL — AB (ref 1.7–7.7)
Neutrophils Relative %: 82 %
Platelets: 331 10*3/uL (ref 150–400)
RBC: 4.56 MIL/uL (ref 3.87–5.11)
RDW: 19.1 % — ABNORMAL HIGH (ref 11.5–15.5)
WBC: 10.2 10*3/uL (ref 4.0–10.5)
nRBC: 0 % (ref 0.0–0.2)

## 2018-10-21 LAB — PROTIME-INR
INR: 2.73
Prothrombin Time: 28.5 seconds — ABNORMAL HIGH (ref 11.4–15.2)

## 2018-10-21 LAB — GLUCOSE, CAPILLARY: Glucose-Capillary: 142 mg/dL — ABNORMAL HIGH (ref 70–99)

## 2018-10-21 MED ORDER — METOPROLOL TARTRATE 5 MG/5ML IV SOLN
5.0000 mg | Freq: Once | INTRAVENOUS | Status: AC
  Administered 2018-10-21: 5 mg via INTRAVENOUS
  Filled 2018-10-21: qty 5

## 2018-10-21 MED ORDER — SODIUM CHLORIDE 0.9 % IV BOLUS
250.0000 mL | Freq: Once | INTRAVENOUS | Status: AC
  Administered 2018-10-21: 250 mL via INTRAVENOUS

## 2018-10-21 MED ORDER — METOPROLOL SUCCINATE ER 100 MG PO TB24
100.0000 mg | ORAL_TABLET | Freq: Every day | ORAL | Status: DC
  Administered 2018-10-21 – 2018-10-27 (×7): 100 mg via ORAL
  Filled 2018-10-21 (×7): qty 1

## 2018-10-21 MED ORDER — NYSTATIN 100000 UNIT/GM EX CREA
TOPICAL_CREAM | Freq: Two times a day (BID) | CUTANEOUS | Status: DC
  Administered 2018-10-21 – 2018-10-26 (×11): via TOPICAL
  Administered 2018-10-26 – 2018-10-27 (×2): 1 via TOPICAL
  Administered 2018-10-27: 10:00:00 via TOPICAL
  Filled 2018-10-21: qty 15

## 2018-10-21 MED ORDER — WARFARIN SODIUM 2 MG PO TABS
2.0000 mg | ORAL_TABLET | Freq: Once | ORAL | Status: AC
  Administered 2018-10-21: 2 mg via ORAL
  Filled 2018-10-21: qty 1

## 2018-10-21 NOTE — Progress Notes (Signed)
ANTICOAGULATION CONSULT NOTE -follow up Pharmacy Consult for Coumadin Indication: atrial fibrillation and history of PE (2006)  Allergies  Allergen Reactions  . Amiodarone Shortness Of Breath and Nausea Only    "Toxicity," also  . Flecainide Shortness Of Breath and Other (See Comments)    "Toxicity," also   . Morphine And Related Nausea And Vomiting and Other (See Comments)    Family requests no morphine due to past reaction. Pt stated it made her sick, severe nausea and vomiting  . Bactrim Ds [Sulfamethoxazole-Trimethoprim] Diarrhea  . Cephalexin Nausea And Vomiting  . Pineapple Itching    Severe itching    Patient Measurements: Height: 6' (182.9 cm) Weight: 210 lb (95.3 kg) IBW/kg (Calculated) : 73.1  Vital Signs: Temp: 97.9 F (36.6 C) (01/22 0511) Temp Source: Oral (01/22 0511) BP: 119/68 (01/22 0953) Pulse Rate: 100 (01/22 0953)  Labs: Recent Labs    10/19/18 0528 10/20/18 0438 10/21/18 0441  HGB 11.7* 12.9 12.2  HCT 37.9 41.9 39.0  PLT 286 291 331  APTT 49*  --   --   LABPROT 36.9* 32.8* 28.5*  INR 3.81 3.27 2.73  CREATININE 2.06* 2.11* 2.01*  CKTOTAL 283*  --   --     Estimated Creatinine Clearance: 26.5 mL/min (A) (by C-G formula based on SCr of 2.01 mg/dL (H)).   Medical History: Past Medical History:  Diagnosis Date  . Acute gastric ulcer without mention of hemorrhage, perforation, or obstruction 2006   EGD  . Anxiety   . Atrial fibrillation (HCC)    flecainide; coumadin  . Breast cancer ([place])    left  . Carotid artery disease ([place])    39/03: RICA 0-09%, LICA 23-30%  //  [place] 07/62: RICA 2-63%; LICA 33-54% >> FU 1 year   . Chronic diastolic heart failure (HCC)    a. echo 10/10: mild LVH, EF 55-60%, mild AI, mild MR, mild to mod LAE, mild RVE, severe RAE, mod TR, PASP 53  /  b.  Echo 3/14:  Mild LVH, EF 55%, Tr AI, MAC, mild MR, mod LAE, PASP 31, trivial eff. // c. echo 1/17: EF 55-60%, mild AI, MAC, mild MR, moderate BAE, moderate TR  .  Chronic lower back pain   . Diabetic peripheral neuropathy ([place]) 03/09/2015  . Diverticulosis   . Esophagitis, unspecified 2006   EGD  . Hiatal hernia 2006   EGD  . History of nuclear stress test    a. Myoview 2/12: EF 69%, no scar or ischemia  . Hx of adenomatous colonic polyps   . Hypertension   . Mediastinal lymphadenopathy   . Memory difficulty 11/14/2016  . Mitral regurgitation    mild, echo, October, 2010  . Osteopenia   . Presence of permanent cardiac pacemaker   . Pulmonary embolus [facility]) 2006   2006, with DVT  . Pulmonary HTN ([place])    53 mmHg echo, 2010, moderate TR, mild right ventricular enlargement  . Tachy-brady syndrome ([place])    s/p pacer 07/2009  . Thyroid nodule    non-neoplastic goiter  . Type II diabetes mellitus ([place])   . Venous insufficiency    Chronic   Assessment: 85 year old female found down at home after a fall. Patient was on chronic Coumadin therapy for atrial fibrillation and history of a PE (2006). INR on admission is therapeutic at 2.61. No overt bleeding noted on admission. Head CT was negative for bleeding.   Pharmacy was consulted 10/18/18 to resume Coumadin therapy.  Home regimen was 2.78m every Monday and 1.25 all other days. Patient's last dose PTA was on 10/17/18 in the PM.   Today INR 2.73, now therapeutic after previously being elevated to 3.27 <3.81.   CBC remains wnl. No bleeding noted.  Warfarin held 10/19/18.   Received ceftriaxone 1/19-1/20 which possibly contributed to increase warfarin effect/ increase INR. Antibiotics now discontinued due to urine culture no growth.  - Patient has eaten 50 % meal. -AKI on CKD stage 3: SCr 2.01   Goal of Therapy:  INR 2-3 Monitor platelets by anticoagulation protocol: Yes   Plan:  Give Coumadin 2 mg today. Daily PT/INR  RNicole Cella RPh Clinical Pharmacist [phone] check AMION for all MPalm Valleyphone numbers After 10:00 PM, call [phone]/22/2020,11:36 AM

## 2018-10-21 NOTE — Progress Notes (Signed)
Patient's HR was sustaining in the 120s-140s. [facility] was paged and placed orders for metoprolol 5mg  IV. Will continue to monitor.

## 2018-10-21 NOTE — Care Management Important Message (Signed)
Important Message  Patient Details  Name: [name] [name] MRN: [id number] Date of Birth: 14-Apr-1931   Medicare Important Message Given:  Yes    Codylee Patil P Fitzhugh 10/21/2018, 10:14 AM

## 2018-10-21 NOTE — Progress Notes (Signed)
Progress Note  Patient Name: [name] [name] Date of Encounter: 10/21/2018  Primary Cardiologist:   Thompson Grayer, MD   Subjective   Atrial fib with rapid rate overnight.  Confused to place and time this morning.  Denies pain or SOB   Inpatient Medications    Scheduled Meds: . bethanechol  25 mg Oral BID  . memantine  5 mg Oral BID  . metoprolol succinate  75 mg Oral Daily  . pregabalin  75 mg Oral QHS  . sodium chloride flush  3 mL Intravenous Q12H  . vitamin B-12  500 mcg Oral Daily  . Warfarin - Pharmacist Dosing Inpatient   Does not apply q1800   Continuous Infusions:  PRN Meds: acetaminophen, dimethicone, senna-docusate   Vital Signs    Vitals:   10/20/18 1916 10/21/18 0404 10/21/18 0409 10/21/18 0511  BP: 112/74 129/69    Pulse: (!) 112 (!) 121    Resp: 18 18    Temp: 98 F (36.7 C) 100 F (37.8 C)  97.9 F (36.6 C)  TempSrc:  Oral  Oral  SpO2: (!) 84% 98%    Weight:   95.3 kg   Height:   6' (1.829 m)     Intake/Output Summary (Last 24 hours) at 10/21/2018 0829 Last data filed at 10/21/2018 0500 Gross per 24 hour  Intake 204.7 ml  Output 550 ml  Net -345.3 ml   Filed Weights   10/18/18 1026 10/19/18 0511 10/21/18 0409  Weight: 95.7 kg 99.8 kg 95.3 kg    Telemetry    Atrial fib with rapid rate - Personally Reviewed  ECG    NA - Personally Reviewed  Physical Exam   GEN: No acute distress.   Neck: No  JVD Cardiac: Irregular RR, no murmurs, rubs, or gallops.  Respiratory: Clear  to auscultation bilaterally. GI: Soft, nontender, non-distended  MS: No edema; No deformity. Neuro:  Nonfocal  Psych: Normal affect but confused.   Labs    Chemistry Recent Labs  Lab 10/18/18 1005  10/19/18 0528 10/20/18 0438 10/21/18 0441  NA 135   < > 138 139 138  K 4.3   < > 3.9 4.3 3.9  CL 98   < > 107 106 110  CO2 23  --  19* 22 19*  GLUCOSE 195*   < > 181* 147* 148*  BUN 40*   < > 37* 39* 38*  CREATININE 2.52*   < > 2.06* 2.11* 2.01*    CALCIUM 9.3  --  7.5* 8.7* 8.6*  PROT 7.7  --   --   --   --   ALBUMIN 2.6*  --   --   --   --   AST 44*  --   --   --   --   ALT 26  --   --   --   --   ALKPHOS 123  --   --   --   --   BILITOT 3.1*  --   --   --   --   GFRNONAA 17*  --  21* 21* 22*  GFRAA 19*  --  25* 24* 26*  ANIONGAP 14  --  12 11 9    < > = values in this interval not displayed.     Hematology Recent Labs  Lab 10/19/18 0528 10/20/18 0438 10/21/18 0441  WBC 11.5* 12.5* 10.2  RBC 4.31 4.76 4.56  HGB 11.7* 12.9 12.2  HCT 37.9 41.9 39.0  MCV 87.9  88.0 85.5  MCH 27.1 27.1 26.8  MCHC 30.9 30.8 31.3  RDW 18.8* 19.0* 19.1*  PLT 286 291 331    Cardiac EnzymesNo results for input(s): TROPONINI in the last 168 hours.  Recent Labs  Lab 10/18/18 1014  TROPIPOC 0.05     BNPNo results for input(s): BNP, PROBNP in the last 168 hours.   DDimer No results for input(s): DDIMER in the last 168 hours.   Radiology    No results found.  Cardiac Studies   Echo  Study Conclusions  - Left ventricle: The cavity size was normal. Wall thickness was   normal. The estimated ejection fraction was 45%. Images were   inadequate for LV wall motion assessment. The study was not   technically sufficient to allow evaluation of LV diastolic   dysfunction due to atrial fibrillation. - Ventricular septum: Mildly D-shaped interventricular septum   suggesting RV pressure/volume overload. - Aortic valve: There was no stenosis. There was trivial   regurgitation. - Mitral valve: Moderately calcified annulus. Mildly calcified   leaflets . There was mild regurgitation. - Left atrium: The atrium was moderately dilated. - Right ventricle: The cavity size was moderately dilated. Pacer   wire or catheter noted in right ventricle. Systolic function was   mildly reduced. - Right atrium: The atrium was severely dilated. - Tricuspid valve: There was severe regurgitation. Peak RV-RA   gradient (S): 37 mm Hg. - Pulmonary arteries:  PA peak pressure: 52 mm Hg (S). - Systemic veins: IVC measured 2.7 cm with < 50% respirophasic   variation, suggesting RA pressure 15 mmHg. - Pericardium, extracardiac: A trivial pericardial effusion was   identified.  Patient Profile     85 y.o. female with a hx of dementia, chronic diastolic CHF, chronic atrial fibrillation on warfarin, history of DVT, hypertension, CKD stage III, diabetes type 2, SSS s/p PPM 2010, chronic venous insufficiency pulmonary hypertension, hiatal hernia, breast cancer s/p left mastectomy and anxiety who is being seen today for the evaluation of CHF at the request of Dr. Tawanna Solo.   Assessment & Plan    ACUTE ON CHRONIC SYSTOLIC AND DIASTOLIC HF:  Still euvolemic.  Holding diuretic secondary to AKI.   Continue to hold the diuretic  PERMANENT ATRIAL FIB:  Rate elevated overnight. .  Continue warfarin. Will give Toprol XL 100 mg tdoay.    ACUTE ON CHRONIC STAGE IV CKD:  Creat is stable but slightly higher than baseline.  Follow  HTN:   BP controlled.      For questions or updates, please contact [place] Please consult [email or web address] for contact info under Cardiology/STEMI.   Signed, Minus Breeding, MD  10/21/2018, 8:29 AM

## 2018-10-21 NOTE — Progress Notes (Signed)
PROGRESS NOTE    [name] [name]  LFY:[id number] DOB: 09-26-1931 DOA: 10/18/2018 PCP: Hoyt Koch, MD   Brief Narrative: Patient is 85 year old female with history of atrial fibrillation on Coumadin, dementia, history of breast cancer status post left mastectomy, status post breast replacement who was brought by her family because she fell at home.  Reported to have been progressively weak for last 3 weeks. Also found to be dyspneaic at home. She was also confused.  Found to be in acute kidney injury on presentation.Admitted for further work-up.  Echocardiogram showed reduced ejection fraction.  Cardiology following.  Current plan is to discharge her to skilled nursing facility after stabilization of her heart rate.  Assessment & Plan:   Principal Problem:   Syncope Active Problems:   Acute lower UTI   History of pulmonary embolism   History of breast cancer   Pacemaker-[employer]   Chronic diastolic (congestive) heart failure (HCC)   Dyspnea on exertion   Diabetes mellitus type 2, noninsulin dependent (HCC)   A-fib (HCC)   Cognitive change   CKD (chronic kidney disease), stage IV (HCC)   Solitary pulmonary nodule on lung CT   Fall at home, initial encounter   Pulmonary embolism on long-term anticoagulation therapy ([place])   Syncope and collapse  Fall: Sustained fall at home.  CT head did not show any acute intracranial abnormalities  C-spine CT negative for fracture. Physical therapy already evaluated the patient and recommended skilled nursing facility.  Social work consulted.  Combined Congestive heart failure/Dyspnea: History of diastolic CHF.  Echo done here showed ejection fraction of 45%,severe TR,  Moderate pulmonary HTN. Continue Toprol.  Not on ACE/ARB due to acute kidney injury/CKD.  Cardiology consulted and following.  Urinary tract infection:Urine culture did not show any growth.  Antibiotics discontinued.  Acute kidney injury on CKD stage 3: Presented with  creatinine of more than 2.  Baseline creatinine around 1.5 .Fluids stopped .Monitor BMP  Chronic atrial fibrillation: Found to be A. fib with RVR on presentation.  She was Started on Cardizem drip.   Dose of  Metoprolol increased.  On anticoagulation with warfarin at home.  Use of warfarin on this elderly lady with falls is very risky.  INR is supratherapeutic so we will hold warfarin for now.   She follows with Dr. Rayann Heman  She needs to follow-up with him as an outpatient and he will decide on the discontinuation of warfarin .  Mild rhabdomyolysis: CK elevated to 499 on presentation.  Associated with fall.   Incidental finding of right thyroid nodule: On CT scan.  Patient is asymptomatic.  Due to her advanced age and dementia family does not want to pursue any further investigation.  Dementia: Patient is pleasantly demented.  Continue supportive care.  Continue Aricept.  Candidal rash on her bilateral groins: Continue nystatin         DVT prophylaxis: Supratherapeutic INR Code Status: Full Family Communication: Discussed with the daughter at the bedside  disposition Plan: Skilled nursing facility after stabilization of her heart rate .She was still in A. fib with RVR this morning.   Consultants: Cardiology  Procedures:none  Antimicrobials:  Anti-infectives (From admission, onward)   Start     Dose/Rate Route Frequency Ordered Stop   10/18/18 1415  cefTRIAXone (ROCEPHIN) 1 g in sodium chloride 0.9 % 100 mL IVPB  Status:  Discontinued     1 g 200 mL/hr over 30 Minutes Intravenous Every 24 hours 10/18/18 1400 10/20/18 0814   10/18/18 1300  cefTRIAXone (ROCEPHIN) 1 g in sodium chloride 0.9 % 100 mL IVPB  Status:  Discontinued     1 g 200 mL/hr over 30 Minutes Intravenous  Once 10/18/18 1257 10/18/18 1258      Subjective: Patient seen and examined at bedside this morning.  Found to be A. fib and RVR.  She denies any chest pain or shortness of breath.  Looked comfortable during my  evaluation.  Objective: Vitals:   10/21/18 0404 10/21/18 0409 10/21/18 0511 10/21/18 0953  BP: 129/69   119/68  Pulse: (!) 121   100  Resp: 18     Temp: 100 F (37.8 C)  97.9 F (36.6 C)   TempSrc: Oral  Oral   SpO2: 98%     Weight:  95.3 kg    Height:  6' (1.829 m)      Intake/Output Summary (Last 24 hours) at 10/21/2018 1132 Last data filed at 10/21/2018 0930 Gross per 24 hour  Intake 444.7 ml  Output 550 ml  Net -105.3 ml   Filed Weights   10/18/18 1026 10/19/18 0511 10/21/18 0409  Weight: 95.7 kg 99.8 kg 95.3 kg    Examination:   General exam: Elderly debilitated female, pleasantly demented HEENT:PERRL,Oral mucosa moist, Ear/Nose normal on gross exam Respiratory system: Bilateral decreased air entry in the bases Cardiovascular system: A. fib with RVR. No JVD, murmurs, rubs, gallops or clicks. Gastrointestinal system: Abdomen is nondistended, soft and nontender. No organomegaly or masses felt. Normal bowel sounds heard. Central nervous system: Alert but not oriented. No focal neurological deficits. Extremities: No edema, no clubbing ,no cyanosis, distal peripheral pulses palpable. Skin: No rashes, lesions or ulcers,no icterus ,no pallor   Data Reviewed: I have personally reviewed following labs and imaging studies  CBC: Recent Labs  Lab 10/18/18 1005 10/18/18 1016 10/19/18 0528 10/20/18 0438 10/21/18 0441  WBC 9.1  --  11.5* 12.5* 10.2  NEUTROABS 8.0*  --   --  10.4* 8.3*  HGB 14.5 16.7* 11.7* 12.9 12.2  HCT 46.9* 49.0* 37.9 41.9 39.0  MCV 87.2  --  87.9 88.0 85.5  PLT 351  --  286 291 761   Basic Metabolic Panel: Recent Labs  Lab 10/18/18 1005 10/18/18 1016 10/19/18 0528 10/20/18 0438 10/21/18 0441  NA 135 137 138 139 138  K 4.3 4.2 3.9 4.3 3.9  CL 98 101 107 106 110  CO2 23  --  19* 22 19*  GLUCOSE 195* 192* 181* 147* 148*  BUN 40* 39* 37* 39* 38*  CREATININE 2.52* 2.30* 2.06* 2.11* 2.01*  CALCIUM 9.3  --  7.5* 8.7* 8.6*   GFR: Estimated  Creatinine Clearance: 26.5 mL/min (A) (by C-G formula based on SCr of 2.01 mg/dL (H)). Liver Function Tests: Recent Labs  Lab 10/18/18 1005  AST 44*  ALT 26  ALKPHOS 123  BILITOT 3.1*  PROT 7.7  ALBUMIN 2.6*   No results for input(s): LIPASE, AMYLASE in the last 168 hours. No results for input(s): AMMONIA in the last 168 hours. Coagulation Profile: Recent Labs  Lab 10/16/18 1605 10/18/18 1005 10/19/18 0528 10/20/18 0438 10/21/18 0441  INR 3.0 2.61 3.81 3.27 2.73   Cardiac Enzymes: Recent Labs  Lab 10/18/18 1005 10/19/18 0528  CKTOTAL 499* 283*   BNP (last 3 results) No results for input(s): PROBNP in the last 8760 hours. HbA1C: No results for input(s): HGBA1C in the last 72 hours. CBG: Recent Labs  Lab 10/19/18 0733 10/20/18 0739 10/21/18 0731  GLUCAP 137* 137* 142*  Lipid Profile: No results for input(s): CHOL, HDL, LDLCALC, TRIG, CHOLHDL, LDLDIRECT in the last 72 hours. Thyroid Function Tests: No results for input(s): TSH, T4TOTAL, FREET4, T3FREE, THYROIDAB in the last 72 hours. Anemia Panel: No results for input(s): VITAMINB12, FOLATE, FERRITIN, TIBC, IRON, RETICCTPCT in the last 72 hours. Sepsis Labs: No results for input(s): PROCALCITON, LATICACIDVEN in the last 168 hours.  Recent Results (from the past 240 hour(s))  Urine culture     Status: None   Collection Time: 10/18/18 11:48 AM  Result Value Ref Range Status   Specimen Description URINE, RANDOM  Final   Special Requests   Final    NONE Performed at [facility], [address] [place], [place] [address]    Culture NO GROWTH  Final   Report Status 10/19/2018 FINAL  Final         Radiology Studies: No results found.      Scheduled Meds: . bethanechol  25 mg Oral BID  . memantine  5 mg Oral BID  . metoprolol succinate  100 mg Oral Daily  . nystatin cream   Topical BID  . pregabalin  75 mg Oral QHS  . sodium chloride flush  3 mL Intravenous Q12H  . vitamin B-12  500 mcg  Oral Daily  . Warfarin - Pharmacist Dosing Inpatient   Does not apply q1800   Continuous Infusions:    LOS: 3 days    Time spent:35 mins. More than 50% of that time was spent in counseling and/or coordination of care.      Shelly Coss, MD [facility] Pager [phone]  If 7PM-7AM, please contact night-coverage [email or web address] Password [facility] 10/21/2018, 11:32 AM

## 2018-10-21 NOTE — Consult Note (Signed)
   [facility] CM Inpatient Consult   10/21/2018  [name] [name] 08-24-1931 [id number]  Patient is currently listed as active with [facility] for chronic disease management services.  Patient has been engaged by a [facility] and was assigned to [facility].  Patient is currently not on [facility] BI report however she is listed on the last given roster.  Met with the patient briefly at the bedside.  She is up in the chair but states, "I am pretty tired."  Notes reveals the patient is being recommended for skill nursing facility.  Chart review reveals per MD notes:  Patient is 85 year old female with history of atrial fibrillation on Coumadin, dementia, history of breast cancer status post left mastectomy, status post breast replacement who was brought by her family because she fell at home.  Reported to have been progressively weak for last 3 weeks. Also found to be dyspneaic. She was also confused.  Found to be in acute kidney injury on presentation. No support person at the bedside. Patient listed with [facility]. Will follow for progress and needs for disposition and transition needs if appropriate.  Of note, [facility] Care Management services does not replace or interfere with any services that are needed or arranged by inpatient case management or social work.  For additional questions or referrals please contact:  Natividad Brood, RN BSN [facility] Liaison  [phone] business mobile phone Toll free office [phone]

## 2018-10-21 NOTE — Evaluation (Signed)
Occupational Therapy Evaluation Patient Details Name: [name] [name] MRN: [id number] DOB: 1931-06-12 Today's Date: 10/21/2018    History of Present Illness Pt is an 85 y.o. female admitted 10/18/18 after falling at home, found down by family; reports progressive weakness and DOE. Found to be severely dehydrated with UTI; mild rhabdomyolysis. Head and neck CT negative for acute abnormality. PMH includes afib (on Coumadin), dementia, pacemaker, breast CA.    Clinical Impression   Pt was living independently, reports driving, although chart states she has not in several months, and ambulating without a device. No family available to confirm PLOF. Pt presents with impaired cognition and generalized weakness and requires lift equipment for OOB. She requires set up to total assist for ADL. Pt will need post acute rehab in SNF. Will follow acutely.    Follow Up Recommendations  SNF;Supervision/Assistance - 24 hour    Equipment Recommendations       Recommendations for Other Services       Precautions / Restrictions Precautions Precautions: Fall Restrictions Weight Bearing Restrictions: No      Mobility Bed Mobility               General bed mobility comments: pt seated in chair  Transfers                 General transfer comment: requires lift equipment    Balance Overall balance assessment: Needs assistance Sitting-balance support: Bilateral upper extremity supported;Feet supported Sitting balance-Leahy Scale: Fair Sitting balance - Comments: at edge of chair                                   ADL either performed or assessed with clinical judgement   ADL Overall ADL's : Needs assistance/impaired Eating/Feeding: Set up;Sitting Eating/Feeding Details (indicate cue type and reason): assist to open packages, set up tray Grooming: Wash/dry hands;Sitting;Moderate assistance;Oral care;Maximal assistance Grooming Details (indicate cue type and  reason): decreased thoroughness Upper Body Bathing: Maximal assistance;Sitting   Lower Body Bathing: Total assistance;Bed level   Upper Body Dressing : Moderate assistance;Sitting   Lower Body Dressing: Total assistance;Bed level                       Vision Patient Visual Report: No change from baseline       Perception     Praxis      Pertinent Vitals/Pain Pain Assessment: Faces Faces Pain Scale: No hurt     Hand Dominance Right   Extremity/Trunk Assessment Upper Extremity Assessment Upper Extremity Assessment: Generalized weakness   Lower Extremity Assessment Lower Extremity Assessment: Defer to PT evaluation       Communication Communication Communication: No difficulties   Cognition Arousal/Alertness: Awake/alert(sluggish) Behavior During Therapy: WFL for tasks assessed/performed Overall Cognitive Status: No family/caregiver present to determine baseline cognitive functioning Area of Impairment: Orientation;Attention;Memory;Following commands;Safety/judgement;Awareness;Problem solving                 Orientation Level: Disoriented to;Place;Time;Situation Current Attention Level: Sustained Memory: Decreased short-term memory Following Commands: Follows one step commands with increased time Safety/Judgement: Decreased awareness of deficits;Decreased awareness of safety Awareness: Intellectual Problem Solving: Slow processing;Requires verbal cues     General Comments       Exercises     Shoulder Instructions      Home Living Family/patient expects to be discharged to:: Private residence Living Arrangements: Alone Available Help at Discharge: Family;Available PRN/intermittently Type of Home:  House Home Access: Level entry     Home Layout: One level     Bathroom Shower/Tub: Teacher, early years/pre: Handicapped height     Home Equipment: Environmental consultant - 2 wheels;Bedside commode;Cane - single point          Prior  Functioning/Environment Level of Independence: Independent with assistive device(s)        Comments: Pt reports indep without device, although owns cane and walker. Indep with ADLs and household tasks. Reports she drives (per chart 5 months ago, not driving anymore). Reports this was only fall in the past year        OT Problem List: Decreased activity tolerance;Impaired balance (sitting and/or standing);Decreased cognition;Decreased safety awareness;Decreased knowledge of use of DME or AE;Decreased strength      OT Treatment/Interventions: Self-care/ADL training;Patient/family education;Balance training;DME and/or AE instruction;Cognitive remediation/compensation    OT Goals(Current goals can be found in the care plan section) Acute Rehab OT Goals Patient Stated Goal: None stated OT Goal Formulation: Patient unable to participate in goal setting Time For Goal Achievement: 11/04/18 Potential to Achieve Goals: Fair ADL Goals Pt Will Perform Grooming: with supervision;sitting;with set-up Pt Will Perform Upper Body Bathing: with min assist;sitting Pt Will Perform Upper Body Dressing: with min assist;sitting Pt Will Transfer to Toilet: with +2 assist;with mod assist;stand pivot transfer;bedside commode Additional ADL Goal #1: Pt will use environmental cues to respond to orientation questions appropriately.  OT Frequency: Min 2X/week   Barriers to D/C:            Co-evaluation              AM-PAC OT "6 Clicks" Daily Activity     Outcome Measure Help from another person eating meals?: A Little Help from another person taking care of personal grooming?: A Lot Help from another person toileting, which includes using toliet, bedpan, or urinal?: Total Help from another person bathing (including washing, rinsing, drying)?: A Lot Help from another person to put on and taking off regular upper body clothing?: A Lot Help from another person to put on and taking off regular lower body  clothing?: Total 6 Click Score: 11   End of Session    Activity Tolerance: Patient tolerated treatment well Patient left: in chair;with call bell/phone within reach;with chair alarm set  OT Visit Diagnosis: Muscle weakness (generalized) (M62.81)                Time: 3474-2595 OT Time Calculation (min): 16 min Charges:  OT General Charges $OT Visit: 1 Visit OT Evaluation $OT Eval Moderate Complexity: 1 Mod  [name] [name], OTR/L Acute Rehabilitation Services Pager: [phone] Office: [phone]  [name] [name] 10/21/2018, 12:51 PM

## 2018-10-22 LAB — BASIC METABOLIC PANEL
Anion gap: 11 (ref 5–15)
BUN: 44 mg/dL — ABNORMAL HIGH (ref 8–23)
CO2: 19 mmol/L — ABNORMAL LOW (ref 22–32)
Calcium: 9.1 mg/dL (ref 8.9–10.3)
Chloride: 104 mmol/L (ref 98–111)
Creatinine, Ser: 2.14 mg/dL — ABNORMAL HIGH (ref 0.44–1.00)
GFR calc non Af Amer: 20 mL/min — ABNORMAL LOW (ref >60)
GFR, EST AFRICAN AMERICAN: 24 mL/min — AB (ref >60)
Glucose, Bld: 159 mg/dL — ABNORMAL HIGH (ref 70–99)
Potassium: 4.2 mmol/L (ref 3.5–5.1)
SODIUM: 134 mmol/L — AB (ref 135–145)

## 2018-10-22 LAB — PROTIME-INR
INR: 2.43
Prothrombin Time: 26.1 seconds — ABNORMAL HIGH (ref 11.4–15.2)

## 2018-10-22 LAB — GLUCOSE, CAPILLARY: Glucose-Capillary: 144 mg/dL — ABNORMAL HIGH (ref 70–99)

## 2018-10-22 MED ORDER — WARFARIN 1.25 MG HALF TABLET
1.2500 mg | ORAL_TABLET | Freq: Once | ORAL | Status: AC
  Administered 2018-10-22: 1.25 mg via ORAL
  Filled 2018-10-22: qty 1

## 2018-10-22 NOTE — Progress Notes (Addendum)
PROGRESS NOTE    [name] [name]  VXB:[id number] DOB: 1930/12/01 DOA: 10/18/2018 PCP: Hoyt Koch, MD   Brief Narrative: Patient is 85 year old female with history of atrial fibrillation on Coumadin, dementia, history of breast cancer status post left mastectomy, status post breast replacement who was brought by her family because she fell at home.  Reported to have been progressively weak for last 3 weeks. Also found to be dyspneaic at home. She was also confused.  Found to be in acute kidney injury on presentation.Admitted for further work-up.  Echocardiogram showed reduced ejection fraction.  Cardiology following.  Current plan is to discharge her to skilled nursing facility after stabilization of her heart rate.  Assessment & Plan:   Principal Problem:   Syncope Active Problems:   Acute lower UTI   History of pulmonary embolism   History of breast cancer   Pacemaker-[employer]   Chronic diastolic (congestive) heart failure (HCC)   Dyspnea on exertion   Diabetes mellitus type 2, noninsulin dependent (HCC)   A-fib (HCC)   Cognitive change   CKD (chronic kidney disease), stage IV (HCC)   Solitary pulmonary nodule on lung CT   Fall at home, initial encounter   Pulmonary embolism on long-term anticoagulation therapy ([place])   Syncope and collapse  Fall: Sustained fall at home.  CT head did not show any acute intracranial abnormalities  C-spine CT negative for fracture. Physical therapy already evaluated the patient and recommended skilled nursing facility.  Social work consulted.  Combined Congestive heart failure/Dyspnea: History of diastolic CHF.  Echo done here showed ejection fraction of 45%,severe TR,  Moderate pulmonary HTN. Continue Toprol.  Not on ACE/ARB due to acute kidney injury/CKD.  Cardiology consulted and following.  She is on Lasix 80 mg twice daily at home.  Lasix held due to acute kidney injury.  Currently she is euvolemic.  Urinary tract infection:Urine  culture did not show any growth.  Antibiotics discontinued.  Acute kidney injury on CKD stage 3: Presented with creatinine of more than 2.  Baseline creatinine around 1.5 .Fluids stopped .Monitor BMP  Chronic atrial fibrillation: Found to be A. fib with RVR on presentation.  Heart  rate still high in the range of 110-120 this morning She was Started on Cardizem drip on admission which has been stopped.   Dose of  Metoprolol increased.  On anticoagulation with warfarin at home.  Use of warfarin on this elderly lady with falls is very risky.  INR is supratherapeutic so we will hold warfarin for now.   She follows with Dr. Rayann Heman  She needs to follow-up with him as an outpatient and he will decide on the discontinuation of warfarin .  Mild rhabdomyolysis: CK elevated to 499 on presentation.  Associated with fall.   Incidental finding of right thyroid nodule: On CT scan.  Patient is asymptomatic.  Due to her advanced age and dementia family does not want to pursue any further investigation.  Dementia: Patient is pleasantly demented.  Continue supportive care.  Continue Aricept.  Candidal rash on her bilateral groins: Continue nystatin   Debility/deconditioning: PT/OT evaluations performed. SNF recommended. SNF appropriate as the patient has received 3 days of hospital care (or the 3 day period has been waived by the patient's insurance company) and is felt to need rehab services to restore this patient to their prior level of function to achieve safe transition back to home care. This patient needs rehab services for at least 5 days per week and skilled nursing  services daily to facilitate this transition. Rehab is being requested as the most appropriate d/c option for this patient and is NOT felt to be for custodial care as evidenced by her ability to ambulate and perform her daily activities at home.      DVT prophylaxis: Supratherapeutic INR Code Status: Full Family Communication: Discussed  with the daughter at the bedside yesterday  disposition Plan: Skilled nursing facility tomorrow after her heart rate remained stable   Consultants: Cardiology  Procedures:none  Antimicrobials:  Anti-infectives (From admission, onward)   Start     Dose/Rate Route Frequency Ordered Stop   10/18/18 1415  cefTRIAXone (ROCEPHIN) 1 g in sodium chloride 0.9 % 100 mL IVPB  Status:  Discontinued     1 g 200 mL/hr over 30 Minutes Intravenous Every 24 hours 10/18/18 1400 10/20/18 0814   10/18/18 1300  cefTRIAXone (ROCEPHIN) 1 g in sodium chloride 0.9 % 100 mL IVPB  Status:  Discontinued     1 g 200 mL/hr over 30 Minutes Intravenous  Once 10/18/18 1257 10/18/18 1258      Subjective: Patient seen and examined at bedside this morning.  Remains comfortable.  Sitting in the chair.  Heart rate is still on the higher side.  Denies any chest pain or shortness of breath.  Objective: Vitals:   10/21/18 0953 10/21/18 1220 10/21/18 2134 10/22/18 0856  BP: 119/68 115/76 (!) 107/54 115/72  Pulse: 100 96 99 88  Resp:  18 20   Temp:  98.4 F (36.9 C) 98.9 F (37.2 C)   TempSrc:  Oral Oral   SpO2:  94% 95%   Weight:      Height:        Intake/Output Summary (Last 24 hours) at 10/22/2018 1043 Last data filed at 10/21/2018 2242 Gross per 24 hour  Intake 240 ml  Output -  Net 240 ml   Filed Weights   10/18/18 1026 10/19/18 0511 10/21/18 0409  Weight: 95.7 kg 99.8 kg 95.3 kg    Examination:  General exam: Elderly debilitated female, pleasantly demented  HEENT:PERRL,Oral mucosa moist, Ear/Nose normal on gross exam Respiratory system: Bilateral air entry on the bases Cardiovascular system: Irregularly irregular elevated JVD, Positive  murmurs, No rubs, gallops or clicks. Gastrointestinal system: Abdomen is nondistended, soft and nontender. No organomegaly or masses felt. Normal bowel sounds heard. Central nervous system: Alert but not  oriented. No focal neurological deficits. Extremities:  Trace edema bilateral lower extremities, no clubbing ,no cyanosis, distal peripheral pulses palpable.     Data Reviewed: I have personally reviewed following labs and imaging studies  CBC: Recent Labs  Lab 10/18/18 1005 10/18/18 1016 10/19/18 0528 10/20/18 0438 10/21/18 0441  WBC 9.1  --  11.5* 12.5* 10.2  NEUTROABS 8.0*  --   --  10.4* 8.3*  HGB 14.5 16.7* 11.7* 12.9 12.2  HCT 46.9* 49.0* 37.9 41.9 39.0  MCV 87.2  --  87.9 88.0 85.5  PLT 351  --  286 291 891   Basic Metabolic Panel: Recent Labs  Lab 10/18/18 1005 10/18/18 1016 10/19/18 0528 10/20/18 0438 10/21/18 0441 10/22/18 0500  NA 135 137 138 139 138 134*  K 4.3 4.2 3.9 4.3 3.9 4.2  CL 98 101 107 106 110 104  CO2 23  --  19* 22 19* 19*  GLUCOSE 195* 192* 181* 147* 148* 159*  BUN 40* 39* 37* 39* 38* 44*  CREATININE 2.52* 2.30* 2.06* 2.11* 2.01* 2.14*  CALCIUM 9.3  --  7.5* 8.7* 8.6*  9.1   GFR: Estimated Creatinine Clearance: 24.9 mL/min (A) (by C-G formula based on SCr of 2.14 mg/dL (H)). Liver Function Tests: Recent Labs  Lab 10/18/18 1005  AST 44*  ALT 26  ALKPHOS 123  BILITOT 3.1*  PROT 7.7  ALBUMIN 2.6*   No results for input(s): LIPASE, AMYLASE in the last 168 hours. No results for input(s): AMMONIA in the last 168 hours. Coagulation Profile: Recent Labs  Lab 10/18/18 1005 10/19/18 0528 10/20/18 0438 10/21/18 0441 10/22/18 0500  INR 2.61 3.81 3.27 2.73 2.43   Cardiac Enzymes: Recent Labs  Lab 10/18/18 1005 10/19/18 0528  CKTOTAL 499* 283*   BNP (last 3 results) No results for input(s): PROBNP in the last 8760 hours. HbA1C: No results for input(s): HGBA1C in the last 72 hours. CBG: Recent Labs  Lab 10/19/18 0733 10/20/18 0739 10/21/18 0731 10/22/18 0736  GLUCAP 137* 137* 142* 144*   Lipid Profile: No results for input(s): CHOL, HDL, LDLCALC, TRIG, CHOLHDL, LDLDIRECT in the last 72 hours. Thyroid Function Tests: No results for input(s): TSH, T4TOTAL, FREET4, T3FREE,  THYROIDAB in the last 72 hours. Anemia Panel: No results for input(s): VITAMINB12, FOLATE, FERRITIN, TIBC, IRON, RETICCTPCT in the last 72 hours. Sepsis Labs: No results for input(s): PROCALCITON, LATICACIDVEN in the last 168 hours.  Recent Results (from the past 240 hour(s))  Urine culture     Status: None   Collection Time: 10/18/18 11:48 AM  Result Value Ref Range Status   Specimen Description URINE, RANDOM  Final   Special Requests   Final    NONE Performed at [facility], [address] [place], [place] [address]    Culture NO GROWTH  Final   Report Status 10/19/2018 FINAL  Final         Radiology Studies: No results found.      Scheduled Meds: . bethanechol  25 mg Oral BID  . memantine  5 mg Oral BID  . metoprolol succinate  100 mg Oral Daily  . nystatin cream   Topical BID  . pregabalin  75 mg Oral QHS  . sodium chloride flush  3 mL Intravenous Q12H  . vitamin B-12  500 mcg Oral Daily  . Warfarin - Pharmacist Dosing Inpatient   Does not apply q1800   Continuous Infusions:    LOS: 4 days    Time spent:35 mins. More than 50% of that time was spent in counseling and/or coordination of care.      Shelly Coss, MD [facility] Pager [phone]  If 7PM-7AM, please contact night-coverage [email or web address] Password [facility] 10/22/2018, 10:43 AM

## 2018-10-22 NOTE — Progress Notes (Signed)
ANTICOAGULATION CONSULT NOTE -follow up Pharmacy Consult for Coumadin Indication: atrial fibrillation and history of PE (2006)  Allergies  Allergen Reactions  . Amiodarone Shortness Of Breath and Nausea Only    "Toxicity," also  . Flecainide Shortness Of Breath and Other (See Comments)    "Toxicity," also   . Morphine And Related Nausea And Vomiting and Other (See Comments)    Family requests no morphine due to past reaction. Pt stated it made her sick, severe nausea and vomiting  . Bactrim Ds [Sulfamethoxazole-Trimethoprim] Diarrhea  . Cephalexin Nausea And Vomiting  . Pineapple Itching    Severe itching    Patient Measurements: Height: 6' (182.9 cm) Weight: 210 lb (95.3 kg) IBW/kg (Calculated) : 73.1  Vital Signs: BP: 115/72 (01/23 0856) Pulse Rate: 88 (01/23 0856)  Labs: Recent Labs    10/20/18 0438 10/21/18 0441 10/22/18 0500  HGB 12.9 12.2  --   HCT 41.9 39.0  --   PLT 291 331  --   LABPROT 32.8* 28.5* 26.1*  INR 3.27 2.73 2.43  CREATININE 2.11* 2.01* 2.14*    Estimated Creatinine Clearance: 24.9 mL/min (A) (by C-G formula based on SCr of 2.14 mg/dL (H)).   Medical History: Past Medical History:  Diagnosis Date  . Acute gastric ulcer without mention of hemorrhage, perforation, or obstruction 2006   EGD  . Anxiety   . Atrial fibrillation (HCC)    flecainide; coumadin  . Breast cancer ([place])    left  . Carotid artery disease ([place])    84/69: RICA 6-29%, LICA 52-84%  //  [place] 13/24: RICA 4-01%; LICA 02-72% >> FU 1 year   . Chronic diastolic heart failure (HCC)    a. echo 10/10: mild LVH, EF 55-60%, mild AI, mild MR, mild to mod LAE, mild RVE, severe RAE, mod TR, PASP 53  /  b.  Echo 3/14:  Mild LVH, EF 55%, Tr AI, MAC, mild MR, mod LAE, PASP 31, trivial eff. // c. echo 1/17: EF 55-60%, mild AI, MAC, mild MR, moderate BAE, moderate TR  . Chronic lower back pain   . Diabetic peripheral neuropathy ([place]) 03/09/2015  . Diverticulosis   . Esophagitis,  unspecified 2006   EGD  . Hiatal hernia 2006   EGD  . History of nuclear stress test    a. Myoview 2/12: EF 69%, no scar or ischemia  . Hx of adenomatous colonic polyps   . Hypertension   . Mediastinal lymphadenopathy   . Memory difficulty 11/14/2016  . Mitral regurgitation    mild, echo, October, 2010  . Osteopenia   . Presence of permanent cardiac pacemaker   . Pulmonary embolus [facility]) 2006   2006, with DVT  . Pulmonary HTN ([place])    53 mmHg echo, 2010, moderate TR, mild right ventricular enlargement  . Tachy-brady syndrome ([place])    s/p pacer 07/2009  . Thyroid nodule    non-neoplastic goiter  . Type II diabetes mellitus ([place])   . Venous insufficiency    Chronic   Assessment: 85 year old female found down at home after a fall. Patient was on chronic Coumadin therapy for atrial fibrillation and history of a PE (2006). INR on admission is therapeutic at 2.61. No overt bleeding noted on admission. Head CT was negative for bleeding.   Pharmacy was consulted 10/18/18 to resume Coumadin therapy.   Home regimen was 2.102m every Monday and 1.25 all other days, last dose PTA was on 10/17/18 in the PM.   Today  INR 2.43, remains therapeutic.  Previously INR was supratherapeutic on 1/20 and warfarin dose held x1 day 10/19/18, then resumed at lower dose 10/20/18.   CBC remained wnl. No bleeding noted.   Off antibiotics, received ceftriaxone 1/19-1/20. Patient has eaten 50 -75% of some meals, uncertain if accurate charting. AKI on CKD stage 3: SCr 2.14   Goal of Therapy:  INR 2-3 Monitor platelets by anticoagulation protocol: Yes   Plan:  Give Coumadin 1.25 mg today, her usual home dosage.  Daily PT/INR  Nicole Cella, RPh Clinical Pharmacist [phone] Please check AMION for all [place] phone numbers After 10:00 PM, call [place] [phone] 10/22/2018,12:35 PM

## 2018-10-22 NOTE — Progress Notes (Signed)
Progress Note  Patient Name: [name] [name] Date of Encounter: 10/22/2018  Primary Cardiologist:   Thompson Grayer, MD   Subjective   Confused.  Denies chest pain or SOB.    Inpatient Medications    Scheduled Meds: . bethanechol  25 mg Oral BID  . memantine  5 mg Oral BID  . metoprolol succinate  100 mg Oral Daily  . nystatin cream   Topical BID  . pregabalin  75 mg Oral QHS  . sodium chloride flush  3 mL Intravenous Q12H  . vitamin B-12  500 mcg Oral Daily  . Warfarin - Pharmacist Dosing Inpatient   Does not apply q1800   Continuous Infusions:  PRN Meds: acetaminophen, dimethicone, senna-docusate   Vital Signs    Vitals:   10/21/18 0511 10/21/18 0953 10/21/18 1220 10/21/18 2134  BP:  119/68 115/76 (!) 107/54  Pulse:  100 96 99  Resp:   18 20  Temp: 97.9 F (36.6 C)  98.4 F (36.9 C) 98.9 F (37.2 C)  TempSrc: Oral  Oral Oral  SpO2:   94% 95%  Weight:      Height:        Intake/Output Summary (Last 24 hours) at 10/22/2018 0804 Last data filed at 10/21/2018 2242 Gross per 24 hour  Intake 480 ml  Output -  Net 480 ml   Filed Weights   10/18/18 1026 10/19/18 0511 10/21/18 0409  Weight: 95.7 kg 99.8 kg 95.3 kg    Telemetry    Atrial fib with intermittent rapid rate - Personally Reviewed  ECG    NA - Personally Reviewed  Physical Exam   GEN: No  acute distress.   Neck: No  JVD Cardiac: Irregular RR, no murmurs, rubs, or gallops.  Respiratory: Clear   to auscultation bilaterally. GI: Soft, nontender, non-distended, normal bowel sounds  MS:  No edema; No deformity. Neuro:   Nonfocal  Psych: Oriented and appropriate    Labs    Chemistry Recent Labs  Lab 10/18/18 1005  10/20/18 0438 10/21/18 0441 10/22/18 0500  NA 135   < > 139 138 134*  K 4.3   < > 4.3 3.9 4.2  CL 98   < > 106 110 104  CO2 23   < > 22 19* 19*  GLUCOSE 195*   < > 147* 148* 159*  BUN 40*   < > 39* 38* 44*  CREATININE 2.52*   < > 2.11* 2.01* 2.14*  CALCIUM 9.3   < >  8.7* 8.6* 9.1  PROT 7.7  --   --   --   --   ALBUMIN 2.6*  --   --   --   --   AST 44*  --   --   --   --   ALT 26  --   --   --   --   ALKPHOS 123  --   --   --   --   BILITOT 3.1*  --   --   --   --   GFRNONAA 17*   < > 21* 22* 20*  GFRAA 19*   < > 24* 26* 24*  ANIONGAP 14   < > 11 9 11    < > = values in this interval not displayed.     Hematology Recent Labs  Lab 10/19/18 0528 10/20/18 0438 10/21/18 0441  WBC 11.5* 12.5* 10.2  RBC 4.31 4.76 4.56  HGB 11.7* 12.9 12.2  HCT 37.9 41.9 39.0  MCV 87.9 88.0 85.5  MCH 27.1 27.1 26.8  MCHC 30.9 30.8 31.3  RDW 18.8* 19.0* 19.1*  PLT 286 291 331    Cardiac EnzymesNo results for input(s): TROPONINI in the last 168 hours.  Recent Labs  Lab 10/18/18 1014  TROPIPOC 0.05     BNPNo results for input(s): BNP, PROBNP in the last 168 hours.   DDimer No results for input(s): DDIMER in the last 168 hours.   Radiology    No results found.  Cardiac Studies   Echo  Study Conclusions  - Left ventricle: The cavity size was normal. Wall thickness was   normal. The estimated ejection fraction was 45%. Images were   inadequate for LV wall motion assessment. The study was not   technically sufficient to allow evaluation of LV diastolic   dysfunction due to atrial fibrillation. - Ventricular septum: Mildly D-shaped interventricular septum   suggesting RV pressure/volume overload. - Aortic valve: There was no stenosis. There was trivial   regurgitation. - Mitral valve: Moderately calcified annulus. Mildly calcified   leaflets . There was mild regurgitation. - Left atrium: The atrium was moderately dilated. - Right ventricle: The cavity size was moderately dilated. Pacer   wire or catheter noted in right ventricle. Systolic function was   mildly reduced. - Right atrium: The atrium was severely dilated. - Tricuspid valve: There was severe regurgitation. Peak RV-RA   gradient (S): 37 mm Hg. - Pulmonary arteries: PA peak pressure:  52 mm Hg (S). - Systemic veins: IVC measured 2.7 cm with < 50% respirophasic   variation, suggesting RA pressure 15 mmHg. - Pericardium, extracardiac: A trivial pericardial effusion was   identified.  Patient Profile     85 y.o. female with a hx of dementia, chronic diastolic CHF, chronic atrial fibrillation on warfarin, history of DVT, hypertension, CKD stage III, diabetes type 2, SSS s/p PPM 2010, chronic venous insufficiency pulmonary hypertension, hiatal hernia, breast cancer s/p left mastectomy and anxiety who is being seen for the evaluation of CHF at the request of Dr. Tawanna Solo.   Assessment & Plan    ACUTE ON CHRONIC SYSTOLIC AND DIASTOLIC HF:   Seems to still be euvolemic.    Intake and output is not accurate.  I agree with continuing to hold diuretic given the increased creat.   PERMANENT ATRIAL FIB:    Therapeutic INR.  Increased Toprol XL last night.   Continue current meds.  Rate is not perfect but we can follow.  Might use dig if creat begins to fall.  No indication for amiodarone at this point.  Would use Cardizem if her BP allows before discharge.   ACUTE ON CHRONIC STAGE IV CKD:  Creat increased.  Agree with continuing to hold Lasix.     HTN:   BP low but tolerating beta blocker.    For questions or updates, please contact [place] Please consult [email or web address] for contact info under Cardiology/STEMI.   Signed, Minus Breeding, MD  10/22/2018, 8:04 AM

## 2018-10-22 NOTE — Clinical Social Work Note (Signed)
Insurance denied authorization for SNF. Sent MD a message with phone number for peer-to-peer. Daughters concerned therapy has not been pushing patient to her full potential. She was ambulating to the bathroom and ambulating short distances with rollator prior to admission.  Dayton Scrape, [place]

## 2018-10-23 DIAGNOSIS — Z7189 Other specified counseling: Secondary | ICD-10-CM

## 2018-10-23 DIAGNOSIS — Z515 Encounter for palliative care: Secondary | ICD-10-CM

## 2018-10-23 LAB — BASIC METABOLIC PANEL
Anion gap: 10 (ref 5–15)
BUN: 47 mg/dL — ABNORMAL HIGH (ref 8–23)
CALCIUM: 8.8 mg/dL — AB (ref 8.9–10.3)
CO2: 18 mmol/L — ABNORMAL LOW (ref 22–32)
Chloride: 106 mmol/L (ref 98–111)
Creatinine, Ser: 1.99 mg/dL — ABNORMAL HIGH (ref 0.44–1.00)
GFR calc non Af Amer: 22 mL/min — ABNORMAL LOW (ref >60)
GFR, EST AFRICAN AMERICAN: 26 mL/min — AB (ref >60)
Glucose, Bld: 123 mg/dL — ABNORMAL HIGH (ref 70–99)
Potassium: 4.3 mmol/L (ref 3.5–5.1)
Sodium: 134 mmol/L — ABNORMAL LOW (ref 135–145)

## 2018-10-23 LAB — PROTIME-INR
INR: 3.68
Prothrombin Time: 35.9 seconds — ABNORMAL HIGH (ref 11.4–15.2)

## 2018-10-23 LAB — ALBUMIN: Albumin: 1.6 g/dL — ABNORMAL LOW (ref 3.5–5.0)

## 2018-10-23 LAB — GLUCOSE, CAPILLARY: Glucose-Capillary: 118 mg/dL — ABNORMAL HIGH (ref 70–99)

## 2018-10-23 MED ORDER — DILTIAZEM HCL 30 MG PO TABS
30.0000 mg | ORAL_TABLET | Freq: Three times a day (TID) | ORAL | Status: DC
  Administered 2018-10-23 – 2018-10-26 (×10): 30 mg via ORAL
  Filled 2018-10-23 (×10): qty 1

## 2018-10-23 MED ORDER — ADULT MULTIVITAMIN W/MINERALS CH
1.0000 | ORAL_TABLET | Freq: Every day | ORAL | Status: DC
  Administered 2018-10-23 – 2018-10-27 (×5): 1 via ORAL
  Filled 2018-10-23 (×5): qty 1

## 2018-10-23 MED ORDER — ZINC OXIDE 40 % EX OINT
TOPICAL_OINTMENT | Freq: Every day | CUTANEOUS | Status: DC
  Administered 2018-10-23: 17:00:00 via TOPICAL
  Administered 2018-10-23: 1 via TOPICAL
  Administered 2018-10-23 – 2018-10-25 (×12): via TOPICAL
  Administered 2018-10-26: 1 via TOPICAL
  Administered 2018-10-26 – 2018-10-27 (×5): via TOPICAL
  Administered 2018-10-27: 1 via TOPICAL
  Administered 2018-10-27 (×3): via TOPICAL
  Filled 2018-10-23 (×2): qty 57

## 2018-10-23 MED ORDER — ENSURE ENLIVE PO LIQD
237.0000 mL | Freq: Two times a day (BID) | ORAL | Status: DC
  Administered 2018-10-24 – 2018-10-27 (×6): 237 mL via ORAL

## 2018-10-23 NOTE — Progress Notes (Signed)
Occupational Therapy Treatment Patient Details Name: [name] [name] MRN: [id number] DOB: 06/15/1931 Today's Date: 10/23/2018    History of present illness Pt is an 85 y.o. female admitted 10/18/18 after falling at home, found down by family; reports progressive weakness and DOE. Found to be severely dehydrated with UTI; mild rhabdomyolysis. Head and neck CT negative for acute abnormality. PMH includes afib (on Coumadin), dementia, pacemaker, breast CA.    OT comments  Pt requiring extensive 2 person assist for rolling for pericare and changing linens. Pt confused and lethargic, but answering appropriately to yes/no questions. Positioned on R side with pillow between knees for pressure relief. RN aware of skin breakdown on buttocks and brief shaking episode with urinary incontinence during session. Pt continues to be appropriate for SNF level rehab. Will continue to follow.  Follow Up Recommendations  SNF;Supervision/Assistance - 24 hour    Equipment Recommendations       Recommendations for Other Services      Precautions / Restrictions Precautions Precautions: Fall Precaution Comments: Wounds on perineum/buttocks Restrictions Weight Bearing Restrictions: No       Mobility Bed Mobility Overal bed mobility: Needs Assistance Bed Mobility: Rolling Rolling: Max assist;+2 for physical assistance;Total assist         General bed mobility comments: MaxA+2 to totalA for rolling R/L multiple times due to multiple bouts of urine/bowel incontinence. Additional repositioning due to buttocks wounds; pt left turned onto R side with schedule written on whiteboard for turning every 2 hours  Transfers                 General transfer comment: Deferred secondary to pt's fatigue and pain after prolonged mobilization in bed. Geomat ordered for future transfers to recliner for pressure relief while seated    Balance                                           ADL  either performed or assessed with clinical judgement   ADL Overall ADL's : Needs assistance/impaired                             Toileting- Clothing Manipulation and Hygiene: +2 for physical assistance;Total assistance;Bed level Toileting - Clothing Manipulation Details (indicate cue type and reason): pt with bowel and urinary incontinence             Vision       Perception     Praxis      Cognition Arousal/Alertness: Lethargic Behavior During Therapy: Flat affect Overall Cognitive Status: Impaired/Different from baseline Area of Impairment: Orientation;Attention;Following commands;Safety/judgement;Awareness;Problem solving                 Orientation Level: Disoriented to;Place;Time;Situation Current Attention Level: Focused Memory: Decreased short-term memory Following Commands: Follows one step commands inconsistently(with multimodal cues) Safety/Judgement: Decreased awareness of deficits;Decreased awareness of safety Awareness: Intellectual Problem Solving: Slow processing;Requires verbal cues;Decreased initiation General Comments: pt with accurate yes/no response        Exercises     Shoulder Instructions       General Comments RN aware of skin integrity of buttocks    Pertinent Vitals/ Pain       Pain Assessment: Faces Faces Pain Scale: Hurts whole lot Pain Location: Back, legs, arms, buttocks... generalized with any movement Pain Descriptors / Indicators: Moaning;Grimacing;Guarding Pain Intervention(s): Monitored during session;Repositioned;Limited  activity within patient's tolerance  Home Living                                          Prior Functioning/Environment              Frequency  Min 2X/week        Progress Toward Goals  OT Goals(current goals can now be found in the care plan section)  Progress towards OT goals: Not progressing toward goals - comment(pt continues to be profoundly  weak)  Acute Rehab OT Goals Patient Stated Goal: None stated OT Goal Formulation: Patient unable to participate in goal setting Time For Goal Achievement: 11/04/18 Potential to Achieve Goals: [place] Discharge plan remains appropriate    Co-evaluation    PT/OT/SLP Co-Evaluation/Treatment: Yes Reason for Co-Treatment: For patient/therapist safety PT goals addressed during session: Mobility/safety with mobility OT goals addressed during session: ADL's and self-care      AM-PAC OT "6 Clicks" Daily Activity     Outcome Measure   Help from another person eating meals?: A Little Help from another person taking care of personal grooming?: A Lot Help from another person toileting, which includes using toliet, bedpan, or urinal?: Total Help from another person bathing (including washing, rinsing, drying)?: Total Help from another person to put on and taking off regular upper body clothing?: Total Help from another person to put on and taking off regular lower body clothing?: Total 6 Click Score: 9    End of Session    OT Visit Diagnosis: Muscle weakness (generalized) (M62.81);Other symptoms and signs involving cognitive function;Pain   Activity Tolerance Patient limited by fatigue;Patient limited by pain   Patient Left with call bell/phone within reach;in bed   Nurse Communication          Time: 8786-7672 OT Time Calculation (min): 48 min  Charges: OT General Charges $OT Visit: 1 Visit OT Treatments $Self Care/Home Management : 23-37 mins  Nestor Lewandowsky, OTR/L Acute Rehabilitation Services Pager: [phone] Office: [phone]   Malka So 10/23/2018, 12:45 PM

## 2018-10-23 NOTE — Consult Note (Signed)
[facility] Nurse wound consult note Patient receiving care in [place].  No family present. Reason for Consult: Multiple DTPIs Wound type: DTPIs Pressure Injury POA: No Measurements: Left inner thigh maroon colored DTPI measures 2 cm x 5.3 cm.  The second maroon colored DTPI on the left inner thigh measures 5.7 cm x 2 cm. A third DTPI closer to the labia on the left inner thigh measures 0.3 cm x 1.3 cm and is missing a portion of the overlying epithelial tissue. The right inner thigh has 4 distinct DTPIs.  The smallest is approximately 0.3 cm x 0.3 cm.  Below that is another DTPI that measures 1 cm x 0.7 cm. Below this one is another DTPI that measures 4 cm x 0.4 cm with an open area missing the epithelial tissue.  The DTPI closest to the labia measures 1 cm x 1.2 cm. The left posterior upper thigh has a DTPI that measures 5 cm x 6 cm, and another DTPI that measures 8 cm x 9 cm.  This wound has scattered areas that are missing the overlying epithelial tissue and several scattered fluid filled bullas.  The left buttock has a DTPI that measures 12 cm x 4.3 cm.  The right buttock has a DTPI that measures 14 cm x 13 cm with two distinct areas that are without overlying epithelial tissue.   Plan of care:  Turn right or left every 2 hours, do not place on back.  Air mattress. ONE disposable underpad at a times. Hourly pericare. Seek medical advice on an indwelling urinary catheter; I recommend discontinuing the PureWick urinary collection system. Foam dressing over each area that has missing epithelial tissue. Assess under all foam dressings placed to areas with missing epithelial tissue and record observations each shift.  Monitor the wound area(s) for worsening of condition such as: Signs/symptoms of infection,  Increase in size,  Development of or worsening of odor, Development of pain, or increased pain at the affected locations.  Notify the medical team if any of these develop.  Thank you for the consult.   Discussed plan of care with the patient and bedside nurse.  [facility] nurse will follow up next week. Val Riles, RN, MSN, CWOCN, CNS-BC, pager [phone]

## 2018-10-23 NOTE — Progress Notes (Signed)
Physical Therapy Treatment Patient Details Name: [name] [name] MRN: [id number] DOB: March 18, 1931 Today's Date: 10/23/2018    History of Present Illness Pt is an 85 y.o. female admitted 10/18/18 after falling at home, found down by family; reports progressive weakness and DOE. Found to be severely dehydrated with UTI; mild rhabdomyolysis. Head and neck CT negative for acute abnormality. PMH includes afib (on Coumadin), dementia, pacemaker, breast CA.    PT Comments    Pt remains fatigued/lethargic; will open eyes and answer questions when asked, but remains confused. MaxA+2 to roll R/L for pericare. Pt with an episode of shaking and incontinence (RN notified). Repositioned to encourage pressure relief due to perineal/buttocks wounds. Turning schedule added to whiteboard for RN/NT to reference. Continue to recommend SNF-level therapies to maximize functional mobility and return to PLOF; per family, pt ambulatory with rollator around her home. Geomat ordered for pressure relief with future transfers to recliner.   Follow Up Recommendations  SNF;Supervision for mobility/OOB     Equipment Recommendations  None recommended by PT    Recommendations for Other Services       Precautions / Restrictions Precautions Precautions: Fall Precaution Comments: Wounds on perineum/buttocks Restrictions Weight Bearing Restrictions: No    Mobility  Bed Mobility Overal bed mobility: Needs Assistance Bed Mobility: Rolling Rolling: Max assist;+2 for physical assistance;Total assist         General bed mobility comments: MaxA+2 to totalA for rolling R/L multiple times due to multiple bouts of urine/bowel incontinence. Additional repositioning due to buttocks wounds; pt left turned onto R side with schedule written on whiteboard for turning every 2 hours  Transfers                 General transfer comment: Deferred secondary to pt's fatigue and pain after prolonged mobilization in bed. Geomat  ordered for future transfers to recliner for pressure relief while seated  Ambulation/Gait                 Stairs             Wheelchair Mobility    Modified Rankin (Stroke Patients Only)       Balance                                            Cognition Arousal/Alertness: Lethargic Behavior During Therapy: Flat affect Overall Cognitive Status: No family/caregiver present to determine baseline cognitive functioning Area of Impairment: Orientation;Attention;Following commands;Safety/judgement;Awareness;Problem solving                   Current Attention Level: Focused   Following Commands: Follows one step commands with increased time;Follows one step commands inconsistently Safety/Judgement: Decreased awareness of deficits;Decreased awareness of safety Awareness: Intellectual Problem Solving: Slow processing;Requires verbal cues;Decreased initiation General Comments: Focused attention as pt will open eyes/answer question, but then returning to lethargic state      Exercises      General Comments General comments (skin integrity, edema, etc.): RN present to observe buttocks/perineal wounds      Pertinent Vitals/Pain Pain Assessment: Faces Faces Pain Scale: Hurts even more Pain Location: Back, legs, arms, buttocks... generalized with any movement Pain Descriptors / Indicators: Moaning;Grimacing;Guarding Pain Intervention(s): Limited activity within patient's tolerance;Monitored during session;Repositioned    Home Living  Prior Function            PT Goals (current goals can now be found in the care plan section) Acute Rehab PT Goals Patient Stated Goal: None stated PT Goal Formulation: With patient Time For Goal Achievement: 11/02/18 Progress towards PT goals: Not progressing toward goals - comment(Limited by fatigue)    Frequency    Min 2X/week      PT Plan Current plan remains  appropriate    Co-evaluation PT/OT/SLP Co-Evaluation/Treatment: Yes Reason for Co-Treatment: Necessary to address cognition/behavior during functional activity;For patient/therapist safety PT goals addressed during session: Mobility/safety with mobility        AM-PAC PT "6 Clicks" Mobility   Outcome Measure  Help needed turning from your back to your side while in a flat bed without using bedrails?: A Lot Help needed moving from lying on your back to sitting on the side of a flat bed without using bedrails?: Total Help needed moving to and from a bed to a chair (including a wheelchair)?: Total Help needed standing up from a chair using your arms (e.g., wheelchair or bedside chair)?: Total Help needed to walk in hospital room?: Total Help needed climbing 3-5 steps with a railing? : Total 6 Click Score: 7    End of Session Equipment Utilized During Treatment: Gait belt Activity Tolerance: Patient limited by fatigue;Patient limited by pain Patient left: in bed;with call bell/phone within reach Nurse Communication: Mobility status PT Visit Diagnosis: Other abnormalities of gait and mobility (R26.89)     Time: 1657-9038 PT Time Calculation (min) (ACUTE ONLY): 34 min  Charges:  $Therapeutic Activity: 8-22 mins                    Mabeline Caras, PT, DPT Acute Rehabilitation Services  Pager [phone] Office [place] 10/23/2018, 12:17 PM

## 2018-10-23 NOTE — Consult Note (Signed)
Consultation Note Date: 10/23/2018   Patient Name: [name] [name]  DOB: 1930/11/06  MRN: [id number]  Age / Sex: 85 y.o., female  PCP: Hoyt Koch, MD Referring Physician: Shelly Coss, MD  Reason for Consultation: Establishing goals of care, Hospice Evaluation and Psychosocial/spiritual support  HPI/Patient Profile: 85 y.o. female  with past medical history of atrial fib, dementia, history of breast cancer status post mastectomy, pacemaker 2006, coronary artery disease, diastolic heart failure, admitted on 10/18/2018 after being found down for an undetermined period of time in her home secondary to a fall.  Family reports the patient has looked weaker for 3 weeks prior to admission.  Upon admission, she was found to be dehydrated and was started on IV fluid  Consult ordered for goals of care and hospice evaluation .   Clinical Assessment and Goals of Care: Patient seen, chart reviewed.  Spoke to patient's daughter, Artemio Aly via phone.  Patient herself is not at her baseline.  She is oriented only to herself, that she is in the hospital, but required prompting to remember what brought her here; does not know who the president is, does not know the year.  Daughter states that normally she has some short-term memory deficits but not like we are noting here in the hospital.  Her baseline prior to admission was independent in the home with the assistance of a rolling walker.  She was having some assistance to come in and ensure that the  meals were being prepared.  Family is acknowledging increased frailty but they are hopeful that patient can get back to her baseline at least using her walker and being in her own home for a period of time.  They are open to hospice support but again would like to see patient go to rehab and see if she can make recover some of her strength, and maintain her  independence as long as possible.  Per physical therapy notes, patient was a 2 person max assist today and skilled nursing facility was recommended, as per occupational notes as well.  Daughter, Artemio Aly is her healthcare power of attorney.  Her telephone number is: [phone].  SUMMARY OF RECOMMENDATIONS   Full code Patient probably would qualify for her hospice in-home benefit secondary to multisystem frailty but this does not accomplish what family needs in terms of physical strengthening, formal physical therapy program prior to returning to her home Family is desirous of skilled nursing facility for rehab.  Per chart review, this is her attending, as well as physical therapist, occupational therapist recommendation as well She would benefit from ongoing goals of care in the community, SNF.  Those services can be reached through [facility] at [phone] or [facility] division at [phone] Code Status/Advance Care Planning:  Full code   Palliative Prophylaxis:   Aspiration, Bowel Regimen, Delirium Protocol, Eye Care, Frequent Pain Assessment, Oral Care and Turn Reposition  Additional Recommendations (Limitations, Scope, Preferences):  Full Scope Treatment  Psycho-social/Spiritual:   Desire for  further Chaplaincy support:no  Additional Recommendations: Referral to [employer]   Prognosis:   Unable to determine  Discharge Planning: [facility] for rehab with Palliative care service follow-up      Primary Diagnoses: Present on Admission: . Acute lower UTI . History of pulmonary embolism . Pacemaker-[employer] . Syncope . Chronic diastolic (congestive) heart failure ([place]) . Dyspnea on exertion . A-fib ([place]) . Cognitive change . CKD (chronic kidney disease), stage IV ([place]) . Solitary pulmonary nodule on lung CT . Syncope and collapse   I have reviewed the medical record,  interviewed the patient and family, and examined the patient. The following aspects are pertinent.  Past Medical History:  Diagnosis Date  . Acute gastric ulcer without mention of hemorrhage, perforation, or obstruction 2006   EGD  . Anxiety   . Atrial fibrillation (HCC)    flecainide; coumadin  . Breast cancer ([place])    left  . Carotid artery disease ([place])    47/82: RICA 9-56%, LICA 21-30%  //  Carotiud [place] 86/57: RICA 8-46%; LICA 96-29% >> FU 1 year   . Chronic diastolic heart failure (HCC)    a. echo 10/10: mild LVH, EF 55-60%, mild AI, mild MR, mild to mod LAE, mild RVE, severe RAE, mod TR, PASP 53  /  b.  Echo 3/14:  Mild LVH, EF 55%, Tr AI, MAC, mild MR, mod LAE, PASP 31, trivial eff. // c. echo 1/17: EF 55-60%, mild AI, MAC, mild MR, moderate BAE, moderate TR  . Chronic lower back pain   . Diabetic peripheral neuropathy ([place]) 03/09/2015  . Diverticulosis   . Esophagitis, unspecified 2006   EGD  . Hiatal hernia 2006   EGD  . History of nuclear stress test    a. Myoview 2/12: EF 69%, no scar or ischemia  . Hx of adenomatous colonic polyps   . Hypertension   . Mediastinal lymphadenopathy   . Memory difficulty 11/14/2016  . Mitral regurgitation    mild, echo, October, 2010  . Osteopenia   . Presence of permanent cardiac pacemaker   . Pulmonary embolus [facility]) 2006   2006, with DVT  . Pulmonary HTN ([place])    53 mmHg echo, 2010, moderate TR, mild right ventricular enlargement  . Tachy-brady syndrome ([place])    s/p pacer 07/2009  . Thyroid nodule    non-neoplastic goiter  . Type II diabetes mellitus ([place])   . Venous insufficiency    Chronic   Social History   Socioeconomic History  . Marital status: Widowed    Spouse name: Not on file  . Number of children: 3  . Years of education: Not on file  . Highest education level: Not on file  Occupational History  . Occupation: Retired   Scientific laboratory technician  . Financial resource strain: Not on file  . Food insecurity:    Worry: Not on  file    Inability: Not on file  . Transportation needs:    Medical: Not on file    Non-medical: Not on file  Tobacco Use  . Smoking status: Former Smoker    Packs/day: 1.00    Years: 47.00    Pack years: 47.00    Types: Cigarettes    Last attempt to quit: 10/26/1998    Years since quitting: 20.0  . Smokeless tobacco: Never Used  Substance and Sexual Activity  . Alcohol use: No    Comment: 10/14/2016 "I'll have a beer q once in awhile"  . Drug use: No  .  Sexual activity: Not Currently  Lifestyle  . Physical activity:    Days per week: Not on file    Minutes per session: Not on file  . Stress: Not on file  Relationships  . Social connections:    Talks on phone: Not on file    Gets together: Not on file    Attends religious service: Not on file    Active member of club or organization: Not on file    Attends meetings of clubs or organizations: Not on file    Relationship status: Not on file  Other Topics Concern  . Not on file  Social History Narrative   Widowed.  Lives in [place] by herself.  Avoids caffeine.  Very active, taking care of her brother who also lives by himself and is in his late 25's.   Family History  Problem Relation Age of Onset  . Heart disease Mother   . Hypertension Mother   . COPD Father   . Hypertension Father   . Hypertension Sister   . Hypertension Brother   . Hypertension Daughter   . Colon cancer Neg Hx   . Heart attack Neg Hx   . Stroke Neg Hx    Scheduled Meds: . bethanechol  25 mg Oral BID  . diltiazem  30 mg Oral Q8H  . liver oil-zinc oxide   Topical 6 X Daily  . memantine  5 mg Oral BID  . metoprolol succinate  100 mg Oral Daily  . nystatin cream   Topical BID  . pregabalin  75 mg Oral QHS  . sodium chloride flush  3 mL Intravenous Q12H  . vitamin B-12  500 mcg Oral Daily  . Warfarin - Pharmacist Dosing Inpatient   Does not apply q1800   Continuous Infusions: PRN Meds:.acetaminophen, dimethicone, senna-docusate Medications Prior  to Admission:  Prior to Admission medications   Medication Sig Start Date End Date Taking? Authorizing Provider  acetaminophen (TYLENOL) 325 MG tablet Take 650 mg by mouth every 6 (six) hours as needed (back pain).    Yes [provider]  bethanechol (URECHOLINE) 25 MG tablet Take 25 mg by mouth 2 (two) times daily.   Yes [provider]  furosemide (LASIX) 80 MG tablet TAKE 1 TABLET (80 MG TOTAL) BY MOUTH 2 (TWO) TIMES DAILY. 01/13/18  Yes Isaiah Serge, NP  memantine (NAMENDA) 5 MG tablet Take 1 tablet (5 mg total) by mouth 2 (two) times daily. 07/23/18  Yes Kathrynn Ducking, MD  metoprolol succinate (TOPROL-XL) 25 MG 24 hr tablet Take 3 tablets (75 mg total) by mouth daily. 09/14/18  Yes Hoyt Koch, MD  potassium chloride 20 MEQ/15ML (10%) SOLN Take 20 mEq by mouth daily as needed (if have not eaten a banana).   Yes [provider]  pregabalin (LYRICA) 75 MG capsule Take 1 capsule (75 mg total) by mouth at bedtime. 08/12/18  Yes Kathrynn Ducking, MD  promethazine (PHENERGAN) 25 MG tablet Take 1 tablet (25 mg total) by mouth every 8 (eight) hours as needed for nausea or vomiting. 10/22/16  Yes Hoyt Koch, MD  vitamin B-12 (CYANOCOBALAMIN) 500 MCG tablet Take 500 mcg by mouth daily.    Yes [provider]  warfarin (COUMADIN) 2.5 MG tablet TAKE 1/2 TABLET TO 1 TABLET DAILY OR AS DIRECTED BY COUMADIN CLINIC Patient taking differently: Take 1.25-2.5 mg by mouth See admin instructions. Take 1.25 mg tablet daily EXCEPT 2.5 mg tablet on Mondays. 06/30/18  Yes Meda Coffee,  Jamse Belfast, MD  blood glucose meter kit and supplies KIT Dispense based on patient and insurance preference. Use before breakfast and at bedtime. Diagnosis code: E11.9 03/26/17   NcheCharlene Brooke, NP  triamcinolone cream (KENALOG) 0.1 % Apply 1 application topically 2 (two) times daily. Patient not taking: Reported on 10/18/2018 05/08/18   Hoyt Koch, MD   Allergies    Allergen Reactions  . Amiodarone Shortness Of Breath and Nausea Only    "Toxicity," also  . Flecainide Shortness Of Breath and Other (See Comments)    "Toxicity," also   . Morphine And Related Nausea And Vomiting and Other (See Comments)    Family requests no morphine due to past reaction. Pt stated it made her sick, severe nausea and vomiting  . Bactrim Ds [Sulfamethoxazole-Trimethoprim] Diarrhea  . Cephalexin Nausea And Vomiting  . Pineapple Itching    Severe itching   Review of Systems  Unable to perform ROS: Dementia    Physical Exam Vitals signs and nursing note reviewed.  HENT:     Head: Normocephalic and atraumatic.  Neck:     Musculoskeletal: Normal range of motion.  Cardiovascular:     Rate and Rhythm: Normal rate.  Pulmonary:     Effort: Pulmonary effort is normal.  Musculoskeletal: Normal range of motion.     Comments: Very weak  Skin:    General: Skin is warm and dry.  Neurological:     Mental Status: She is alert.     Comments: Oriented to self that she is in the hospital in the town in which she lives she does not know the date, the year or who the president is  Psychiatric:        Mood and Affect: Mood normal.     Comments: Thought processes circumstantial     Vital Signs: BP 114/74   Pulse 87   Temp 97.8 F (36.6 C) (Oral)   Resp 20   Ht 6' (1.829 m)   Wt 104.8 kg   SpO2 92%   BMI 31.33 kg/m  Pain Scale: 0-10   Pain Score: 0-No pain   SpO2: SpO2: 92 % O2 Device:SpO2: 92 % O2 Flow Rate: .O2 Flow Rate (L/min): 2 L/min  IO: Intake/output summary:   Intake/Output Summary (Last 24 hours) at 10/23/2018 1509 Last data filed at 10/23/2018 1235 Gross per 24 hour  Intake -  Output 200 ml  Net -200 ml    LBM: Last BM Date: 10/22/18 Baseline Weight: Weight: 95.7 kg Most recent weight: Weight: 104.8 kg     Palliative Assessment/Data:   Flowsheet Rows     Most Recent Value  Intake Tab  Referral Department  Hospitalist  Unit at Time of  Referral  Med/Surg Unit  Palliative Care Type  New Palliative care  Reason for referral  Clarify Goals of Care, Psychosocial or Spiritual support  Date of Admission  10/18/18  Date first seen by Palliative Care  10/23/18  Clinical Assessment  Palliative Performance Scale Score  40%  Pain Max last 24 hours  Not able to report  Pain Min Last 24 hours  Not able to report  Dyspnea Max Last 24 Hours  Not able to report  Dyspnea Min Last 24 hours  Not able to report  Nausea Max Last 24 Hours  Not able to report  Nausea Min Last 24 Hours  Not able to report  Anxiety Max Last 24 Hours  Not able to report  Anxiety Min Last 24 Hours  Not able to report  Other Max Last 24 Hours  Not able to report  Psychosocial & Spiritual Assessment  Palliative Care Outcomes  Patient/Family meeting held?  Yes  Who was at the meeting?  pt, pt's dtr      Time In: 1430 Time Out: 1520 Time Total: 50 min Greater than 50%  of this time was spent counseling and coordinating care related to the above assessment and plan.Staffed with clinical SW, [place] Scrape  Signed by: Dory Horn, NP   Please contact Palliative Medicine Team phone at [phone] for questions and concerns.  For individual provider: See Shea Evans

## 2018-10-23 NOTE — Progress Notes (Signed)
Progress Note  Patient Name: [name] [name] Date of Encounter: 10/23/2018  Primary Cardiologist:   Thompson Grayer, MD   Subjective   She is confused this morning.  Denies chest pain or SOB.   Very weak.  Inpatient Medications    Scheduled Meds: . bethanechol  25 mg Oral BID  . memantine  5 mg Oral BID  . metoprolol succinate  100 mg Oral Daily  . nystatin cream   Topical BID  . pregabalin  75 mg Oral QHS  . sodium chloride flush  3 mL Intravenous Q12H  . vitamin B-12  500 mcg Oral Daily  . Warfarin - Pharmacist Dosing Inpatient   Does not apply q1800   Continuous Infusions:  PRN Meds: acetaminophen, dimethicone, senna-docusate   Vital Signs    Vitals:   10/22/18 1244 10/22/18 1935 10/23/18 0511 10/23/18 0514  BP: (!) 87/55 123/75  118/89  Pulse: 75 85  98  Resp: 18 18  20   Temp: 98.4 F (36.9 C) 98 F (36.7 C)  97.8 F (36.6 C)  TempSrc: Oral Oral  Oral  SpO2: 94% 95%  94%  Weight:   104.8 kg   Height:        Intake/Output Summary (Last 24 hours) at 10/23/2018 0751 Last data filed at 10/23/2018 0600 Gross per 24 hour  Intake -  Output 100 ml  Net -100 ml   Filed Weights   10/19/18 0511 10/21/18 0409 10/23/18 0511  Weight: 99.8 kg 95.3 kg 104.8 kg    Telemetry    Atrial fib with elevated rate - Personally Reviewed  ECG    NA - Personally Reviewed  Physical Exam   GEN: No  acute distress.   Neck: No  JVD Cardiac: Irregular RR, no murmurs, rubs, or gallops.  Respiratory: Clear to auscultation bilaterally. GI: Soft, nontender, non-distended, normal bowel sounds  MS:  No edema; No deformity. Neuro:   Nonfocal but diffusely weak.   Psych: Oriented and appropriate     Labs    Chemistry Recent Labs  Lab 10/18/18 1005  10/20/18 0438 10/21/18 0441 10/22/18 0500  NA 135   < > 139 138 134*  K 4.3   < > 4.3 3.9 4.2  CL 98   < > 106 110 104  CO2 23   < > 22 19* 19*  GLUCOSE 195*   < > 147* 148* 159*  BUN 40*   < > 39* 38* 44*    CREATININE 2.52*   < > 2.11* 2.01* 2.14*  CALCIUM 9.3   < > 8.7* 8.6* 9.1  PROT 7.7  --   --   --   --   ALBUMIN 2.6*  --   --   --   --   AST 44*  --   --   --   --   ALT 26  --   --   --   --   ALKPHOS 123  --   --   --   --   BILITOT 3.1*  --   --   --   --   GFRNONAA 17*   < > 21* 22* 20*  GFRAA 19*   < > 24* 26* 24*  ANIONGAP 14   < > 11 9 11    < > = values in this interval not displayed.     Hematology Recent Labs  Lab 10/19/18 0528 10/20/18 0438 10/21/18 0441  WBC 11.5* 12.5* 10.2  RBC 4.31 4.76  4.56  HGB 11.7* 12.9 12.2  HCT 37.9 41.9 39.0  MCV 87.9 88.0 85.5  MCH 27.1 27.1 26.8  MCHC 30.9 30.8 31.3  RDW 18.8* 19.0* 19.1*  PLT 286 291 331    Cardiac EnzymesNo results for input(s): TROPONINI in the last 168 hours.  Recent Labs  Lab 10/18/18 1014  TROPIPOC 0.05     BNPNo results for input(s): BNP, PROBNP in the last 168 hours.   DDimer No results for input(s): DDIMER in the last 168 hours.   Radiology    No results found.  Cardiac Studies   Echo  Study Conclusions  - Left ventricle: The cavity size was normal. Wall thickness was   normal. The estimated ejection fraction was 45%. Images were   inadequate for LV wall motion assessment. The study was not   technically sufficient to allow evaluation of LV diastolic   dysfunction due to atrial fibrillation. - Ventricular septum: Mildly D-shaped interventricular septum   suggesting RV pressure/volume overload. - Aortic valve: There was no stenosis. There was trivial   regurgitation. - Mitral valve: Moderately calcified annulus. Mildly calcified   leaflets . There was mild regurgitation. - Left atrium: The atrium was moderately dilated. - Right ventricle: The cavity size was moderately dilated. Pacer   wire or catheter noted in right ventricle. Systolic function was   mildly reduced. - Right atrium: The atrium was severely dilated. - Tricuspid valve: There was severe regurgitation. Peak RV-RA    gradient (S): 37 mm Hg. - Pulmonary arteries: PA peak pressure: 52 mm Hg (S). - Systemic veins: IVC measured 2.7 cm with < 50% respirophasic   variation, suggesting RA pressure 15 mmHg. - Pericardium, extracardiac: A trivial pericardial effusion was   identified.  Patient Profile     85 y.o. female with a hx of dementia, chronic diastolic CHF, chronic atrial fibrillation on warfarin, history of DVT, hypertension, CKD stage III, diabetes type 2, SSS s/p PPM 2010, chronic venous insufficiency pulmonary hypertension, hiatal hernia, breast cancer s/p left mastectomy and anxiety who is being seen for the evaluation of CHF at the request of Dr. Tawanna Solo.   Assessment & Plan    ACUTE ON CHRONIC SYSTOLIC AND DIASTOLIC HF:    I/O incorrect.    Her weights also are all over the place.  However, clinically she is lying flat and seems to be euvolemic.  I think she is doing OK without the Lasix.  However, reassess this daily to determine when this can/should be resumed.   PERMANENT ATRIAL FIB:    Rate is still slightly high.  Her beta blocker dose was increased.  I will give low dose of IR Cardizem if her BP allows and we can consolidate the dose before discharge.   ACUTE ON CHRONIC STAGE IV CKD:  Creat above baseline but lower today.  Agree with continuing to hold the Lasix.  HTN:   BP low but tolerating beta blocker.  Will add Cardizem as above.    For questions or updates, please contact [place] Please consult [email or web address] for contact info under Cardiology/STEMI.   Signed, Minus Breeding, MD  10/23/2018, 7:51 AM

## 2018-10-23 NOTE — Progress Notes (Signed)
ANTICOAGULATION CONSULT NOTE -follow up Pharmacy Consult for Coumadin Indication: h/o atrial fibrillation and history of PE (2006)  Allergies  Allergen Reactions  . Amiodarone Shortness Of Breath and Nausea Only    "Toxicity," also  . Flecainide Shortness Of Breath and Other (See Comments)    "Toxicity," also   . Morphine And Related Nausea And Vomiting and Other (See Comments)    Family requests no morphine due to past reaction. Pt stated it made her sick, severe nausea and vomiting  . Bactrim Ds [Sulfamethoxazole-Trimethoprim] Diarrhea  . Cephalexin Nausea And Vomiting  . Pineapple Itching    Severe itching    Patient Measurements: Height: 6' (182.9 cm) Weight: 231 lb (104.8 kg) IBW/kg (Calculated) : 73.1  Vital Signs: Temp: 97.8 F (36.6 C) (01/24 1330) Temp Source: Oral (01/24 1330) BP: 114/74 (01/24 1330) Pulse Rate: 87 (01/24 1330)  Labs: Recent Labs    10/21/18 0441 10/22/18 0500 10/23/18 0651  HGB 12.2  --   --   HCT 39.0  --   --   PLT 331  --   --   LABPROT 28.5* 26.1* 35.9*  INR 2.73 2.43 3.68  CREATININE 2.01* 2.14* 1.99*    Estimated Creatinine Clearance: 28 mL/min (A) (by C-G formula based on SCr of 1.99 mg/dL (H)).   Medical History: Past Medical History:  Diagnosis Date  . Acute gastric ulcer without mention of hemorrhage, perforation, or obstruction 2006   EGD  . Anxiety   . Atrial fibrillation (HCC)    flecainide; coumadin  . Breast cancer ([place])    left  . Carotid artery disease ([place])    59/97: RICA 7-41%, LICA 42-39%  //  Carotiud [place] 53/20: RICA 2-33%; LICA 43-56% >> FU 1 year   . Chronic diastolic heart failure (HCC)    a. echo 10/10: mild LVH, EF 55-60%, mild AI, mild MR, mild to mod LAE, mild RVE, severe RAE, mod TR, PASP 53  /  b.  Echo 3/14:  Mild LVH, EF 55%, Tr AI, MAC, mild MR, mod LAE, PASP 31, trivial eff. // c. echo 1/17: EF 55-60%, mild AI, MAC, mild MR, moderate BAE, moderate TR  . Chronic lower back pain   . Diabetic  peripheral neuropathy ([place]) 03/09/2015  . Diverticulosis   . Esophagitis, unspecified 2006   EGD  . Hiatal hernia 2006   EGD  . History of nuclear stress test    a. Myoview 2/12: EF 69%, no scar or ischemia  . Hx of adenomatous colonic polyps   . Hypertension   . Mediastinal lymphadenopathy   . Memory difficulty 11/14/2016  . Mitral regurgitation    mild, echo, October, 2010  . Osteopenia   . Presence of permanent cardiac pacemaker   . Pulmonary embolus [facility]) 2006   2006, with DVT  . Pulmonary HTN ([place])    53 mmHg echo, 2010, moderate TR, mild right ventricular enlargement  . Tachy-brady syndrome ([place])    s/p pacer 07/2009  . Thyroid nodule    non-neoplastic goiter  . Type II diabetes mellitus ([place])   . Venous insufficiency    Chronic   Assessment: 85 year old female found down at home after a fall. Patient was on chronic Coumadin therapy for atrial fibrillation and history of a PE (2006). INR on admission is therapeutic at 2.61. No overt bleeding noted on admission. Head CT was negative for bleeding.   Pharmacy was consulted 10/18/18 to resume Coumadin therapy.   Home regimen was 2.65m  every Monday and 1.25 all other days, last dose PTA was on 10/17/18 in the PM.   Today INR up to 3.68 today from 2.43, supratherapeutic. Previously INR was supratherapeutic on 1/20, 1/21 then INR down within INR goal and low dose warfarin resumed 1/22 and 10/22/18. Possibly effected by decreased meal intake. Noted meal intake only 15% lunch today, nothing charted in Epic yesterday, down from  50 -75% of some meals 8/33,  uncertain if all meal intake charted.  CBC wnl 1/22. No bleeding reported. AKI on CKD stage 3: SCr 2.14>down to 1.99 today.   Goal of Therapy:  INR 2-3 Monitor platelets by anticoagulation protocol: Yes   Plan:  Hold Coumadin today  Daily PT/INR  Nicole Cella, RPh Clinical Pharmacist [phone] Please check AMION for all [place] phone numbers After 10:00 PM, call [place] [phone] 10/23/2018,1:35 PM

## 2018-10-23 NOTE — Progress Notes (Signed)
Initial Nutrition Assessment  DOCUMENTATION CODES:   Obesity unspecified  INTERVENTION:  Ensure Enlive po BID, each supplement provides 350 kcal and 20 grams of protein  Multivitamin with minerals daily.  NUTRITION DIAGNOSIS:   Increased nutrient needs related to wound healing as evidenced by estimated needs.  GOAL:   Patient will meet greater than or equal to 90% of their needs  MONITOR:   PO intake, Labs, I & O's, Weight trends, Skin  REASON FOR ASSESSMENT:   Low Braden   ASSESSMENT:   Pt with PMH of atrial fibrillation, dementia, breast cancer w/ left mastectomy pacemaker, chronic diastolic CHF, HTN, CKD stage III, T2DM and hiatal hernia. Presented to ED on 1/19 after she fell in the middle of the night and her daughter found her.  Pt laying flat in bed, not very responsive. She kept her eyes closed during most of the visit; she could open her eyes when asked to. When asking about where she fell at she was not sure and said "I guess here". Re-visist pt at lunch; pt was sitting up and eating, was more alert.  Pt said that she likes cheerios and remembers having them at home for breakfast and lunch. She did say she only eats 2 meals a day. Pt reported that her son and daughter bring her home cooked meals throughout the week. She was unable to recall much more and could not remember breakfast from today (1/24). Observed that there were water cups on her table and she says she drinks them sometimes. At re-visit pt reported she enjoys baked [place], fish and egg salad. She eats fruits and vegetables at both meals.   Saw the NT and she said that the pt tried to feed herself breakfast and that she did eat a little bit. Per pt chart meal completion is between 15-75%. At re-visit pt had ate ~50% of her meal and said she was full. Spoke with nurse who said he thinks her appetite and mobility were fine PTA.  NFPE revealed mild depletion in upper arm, temple, and hand. Was unable to assess  scapular area d/t pt lying down. Unable to obtain wt hx and UBW. Per pt chart insurance has denied SNF. Palliative care consulted.   Medications reviewed and include: Vit B12 573mcg daily Labs reviewed:  CBG (last 3)  Recent Labs    10/21/18 0731 10/22/18 0736 10/23/18 0837  GLUCAP 142* 144* 118*    NUTRITION - FOCUSED PHYSICAL EXAM:    Most Recent Value  Orbital Region  No depletion  Upper Arm Region  Mild depletion  Thoracic and Lumbar Region  No depletion  Buccal Region  No depletion  Temple Region  Mild depletion  Clavicle Bone Region  No depletion  Clavicle and Acromion Bone Region  No depletion  Scapular Bone Region  Unable to assess  Dorsal Hand  Mild depletion  Patellar Region  No depletion  Anterior Thigh Region  No depletion  Posterior Calf Region  No depletion  Edema (RD Assessment)  Mild  Hair  Reviewed  Eyes  Reviewed  Mouth  Reviewed  Skin  Reviewed  Nails  Reviewed     Diet Order:   Diet Order            Diet heart healthy/carb modified Room service appropriate? Yes; Fluid consistency: Thin  Diet effective now             EDUCATION NEEDS:   No education needs have been identified at this time  Skin:  Skin Assessment: Skin Integrity Issues: Skin Integrity Issues:: Stage II, Stage I, Other (Comment) Stage I: 1/23 Left buttocks, right thigh, left labia Stage II: 1/23 right buttocks Other: MASD Buttocks, groin, coccyx, labia  Last BM:  1/23  Height:   Ht Readings from Last 1 Encounters:  10/21/18 6' (1.829 m)    Weight:   Wt Readings from Last 1 Encounters:  10/23/18 104.8 kg    Ideal Body Weight:  72.72 kg  BMI:  Body mass index is 31.33 kg/m.  Estimated Nutritional Needs:   Kcal:  1800-2000 kcal  Protein:  100-110  Fluid:  >/=1.[place] Dietetic Intern

## 2018-10-23 NOTE — Clinical Social Work Note (Addendum)
MD tried doing peer-to-peer review but [facility] Falling told him the case is closed and that he was supposed to call before noon yesterday. SNF was not notified of denial until 4:30 yesterday. Notified daughter and gave her phone number for expedited appeal. SNF is looking for phone number of person from [facility] that called her yesterday with denial. They are willing to resubmit clinicals to start a new authorization.  Dayton Scrape, [place] [phone]  2:16 pm SNF is resubmitting insurance authorization.  Dayton Scrape, [place]

## 2018-10-23 NOTE — Progress Notes (Signed)
PROGRESS NOTE    [name] [name]  IOE:[id number] DOB: 1931/06/13 DOA: 10/18/2018 PCP: Hoyt Koch, MD   Brief Narrative: Patient is 85 year old female with history of atrial fibrillation on Coumadin, dementia, history of breast cancer status post left mastectomy who was brought by her family because she fell at home.  Reported to have been progressively weak for last 3 weeks. Also found to be dyspneaic at home. .  Found to be in acute kidney injury on presentation.Admitted for further work-up.  Echocardiogram showed reduced ejection fraction.  Cardiology following.  Hospital course remarkable for A. fib with RVR .  Cardiology following. Incidental finding of new pressure ulcers today.  Medical staff aware.  Risk management consulted. She has been denied skilled nursing facility by the her insurance saying that she might be more custodial than rehab.But she was reported to be doing well at home, ambulating, doing her usual activities.  Expedited rappelling  will be submitted to the insurance.SW aware. I also discussed with the daughter today.  Given her advanced age, comorbidities, history of breast cancer, dementia, she might be a candidate for home with hospice.Pallitiave care consulted.   Assessment & Plan:   Principal Problem:   Syncope Active Problems:   Acute lower UTI   History of pulmonary embolism   History of breast cancer   Pacemaker-[employer]   Chronic diastolic (congestive) heart failure (HCC)   Dyspnea on exertion   Diabetes mellitus type 2, noninsulin dependent (HCC)   A-fib (HCC)   Cognitive change   CKD (chronic kidney disease), stage IV (HCC)   Solitary pulmonary nodule on lung CT   Fall at home, initial encounter   Pulmonary embolism on long-term anticoagulation therapy ([place])   Syncope and collapse  Fall: Sustained fall at home.  CT head did not show any acute intracranial abnormalities  C-spine CT negative for fracture. Physical therapy already  evaluated the patient and recommended skilled nursing facility.  Social work following.  Combined Congestive heart failure/Dyspnea: History of diastolic CHF.  Echo done here showed ejection fraction of 45%,severe TR,  Moderate pulmonary HTN. Continue Toprol.  Not on ACE/ARB due to acute kidney injury/CKD.  Cardiology consulted and following.  She is on Lasix 80 mg twice daily at home.  Lasix held due to acute kidney injury.  Currently she is euvolemic.  Urinary tract infection:Urine culture did not show any growth.  Antibiotics discontinued.  Acute kidney injury on CKD stage 3: Presented with creatinine of more than 2.  Baseline creatinine around 1.5 .Fluids stopped .Monitor BMP.  Mild improvement in the kidney function today.  Chronic atrial fibrillation: Found to be A. fib with RVR on presentation.  Heart  rate still high in the range of 110-120 this morning She was Started on Cardizem drip on admission which has been stopped.   Dose of  Metoprolol increased.  On anticoagulation with warfarin at home.  Use of warfarin on this elderly lady with falls is very risky.  INR is supratherapeutic so we will hold warfarin for now.   She follows with Dr. Rayann Heman  She needs to follow-up with him as an outpatient and he will decide on the discontinuation of warfarin . Cardiology following her.  Added Cardizem today  Mild rhabdomyolysis: CK elevated to 499 on presentation.  Associated with fall.   Incidental finding of right thyroid nodule: On CT scan.  Patient is asymptomatic.  Due to her advanced age and dementia family does not want to pursue any further investigation.  Dementia: Patient is pleasantly demented.  Continue supportive care.  Continue Aricept.  Candidal rash on her bilateral groins: Continue nystatin  Bilateral deep tissue pressure injuries: New , developed during the hospitalization .Wound care following.  Continue supportive care.  Turn frequently.  Unit director and risk management  talking to the family.   Debility/deconditioning: PT/OT evaluations performed. SNF recommended. SNF appropriate as the patient has received 3 days of hospital care (or the 3 day period has been waived by the patient's insurance company) and is felt to need rehab services to restore this patient to their prior level of function to achieve safe transition back to home care. This patient needs rehab services for at least 5 days per week and skilled nursing services daily to facilitate this transition. Rehab is being requested as the most appropriate d/c option for this patient and is NOT felt to be for custodial care as evidenced by her ability to ambulate and perform her daily activities at home.  Her insurance has denied for skilled nursing facility.  Expedited appealing has been submitted.  Reference number [id number]      DVT prophylaxis: Supratherapeutic INR Code Status: Full Family Communication: Discussed with the daughter on phone today disposition Plan: Skilled nursing facility vs Home after improvement in her heart rate  Consultants: Cardiology  Procedures:none  Antimicrobials:  Anti-infectives (From admission, onward)   Start     Dose/Rate Route Frequency Ordered Stop   10/18/18 1415  cefTRIAXone (ROCEPHIN) 1 g in sodium chloride 0.9 % 100 mL IVPB  Status:  Discontinued     1 g 200 mL/hr over 30 Minutes Intravenous Every 24 hours 10/18/18 1400 10/20/18 0814   10/18/18 1300  cefTRIAXone (ROCEPHIN) 1 g in sodium chloride 0.9 % 100 mL IVPB  Status:  Discontinued     1 g 200 mL/hr over 30 Minutes Intravenous  Once 10/18/18 1257 10/18/18 1258      Subjective: Patient seen and examined at bedside this morning.  Her heart rate is still on the higher side this morning.  Looks very weak today.  Hardly able to speak.  Participated with physical therapy today  Objective: Vitals:   10/22/18 1244 10/22/18 1935 10/23/18 0511 10/23/18 0514  BP: (!) 87/55 123/75  118/89  Pulse: 75 85   98  Resp: 18 18  20   Temp: 98.4 F (36.9 C) 98 F (36.7 C)  97.8 F (36.6 C)  TempSrc: Oral Oral  Oral  SpO2: 94% 95%  94%  Weight:   104.8 kg   Height:        Intake/Output Summary (Last 24 hours) at 10/23/2018 1327 Last data filed at 10/23/2018 1235 Gross per 24 hour  Intake -  Output 200 ml  Net -200 ml   Filed Weights   10/19/18 0511 10/21/18 0409 10/23/18 0511  Weight: 99.8 kg 95.3 kg 104.8 kg    Examination:   General exam: Elderly debilitated female , pleasantly demented  HEENT:PERRL,Oral mucosa moist, Ear/Nose normal on gross exam Respiratory system: Bilateral decreased air entry on the bases Cardiovascular system: Irregularly irregular, elevated JVD, murmur present.No rubs, gallops or clicks. Gastrointestinal system: Abdomen is nondistended, soft and nontender. No organomegaly or masses felt. Normal bowel sounds heard. Central nervous system: Alert but not  oriented. No focal neurological deficits. Extremities: Trace edema, no clubbing ,no cyanosis, distal peripheral pulses palpable. Skin: Deep tissue pressure injury on the buttocks      Data Reviewed: I have personally reviewed following labs and imaging studies  CBC: Recent Labs  Lab 10/18/18 1005 10/18/18 1016 10/19/18 0528 10/20/18 0438 10/21/18 0441  WBC 9.1  --  11.5* 12.5* 10.2  NEUTROABS 8.0*  --   --  10.4* 8.3*  HGB 14.5 16.7* 11.7* 12.9 12.2  HCT 46.9* 49.0* 37.9 41.9 39.0  MCV 87.2  --  87.9 88.0 85.5  PLT 351  --  286 291 633   Basic Metabolic Panel: Recent Labs  Lab 10/19/18 0528 10/20/18 0438 10/21/18 0441 10/22/18 0500 10/23/18 0651  NA 138 139 138 134* 134*  K 3.9 4.3 3.9 4.2 4.3  CL 107 106 110 104 106  CO2 19* 22 19* 19* 18*  GLUCOSE 181* 147* 148* 159* 123*  BUN 37* 39* 38* 44* 47*  CREATININE 2.06* 2.11* 2.01* 2.14* 1.99*  CALCIUM 7.5* 8.7* 8.6* 9.1 8.8*   GFR: Estimated Creatinine Clearance: 28 mL/min (A) (by C-G formula based on SCr of 1.99 mg/dL (H)). Liver  Function Tests: Recent Labs  Lab 10/18/18 1005 10/23/18 0651  AST 44*  --   ALT 26  --   ALKPHOS 123  --   BILITOT 3.1*  --   PROT 7.7  --   ALBUMIN 2.6* 1.6*   No results for input(s): LIPASE, AMYLASE in the last 168 hours. No results for input(s): AMMONIA in the last 168 hours. Coagulation Profile: Recent Labs  Lab 10/19/18 0528 10/20/18 0438 10/21/18 0441 10/22/18 0500 10/23/18 0651  INR 3.81 3.27 2.73 2.43 3.68   Cardiac Enzymes: Recent Labs  Lab 10/18/18 1005 10/19/18 0528  CKTOTAL 499* 283*   BNP (last 3 results) No results for input(s): PROBNP in the last 8760 hours. HbA1C: No results for input(s): HGBA1C in the last 72 hours. CBG: Recent Labs  Lab 10/19/18 0733 10/20/18 0739 10/21/18 0731 10/22/18 0736 10/23/18 0837  GLUCAP 137* 137* 142* 144* 118*   Lipid Profile: No results for input(s): CHOL, HDL, LDLCALC, TRIG, CHOLHDL, LDLDIRECT in the last 72 hours. Thyroid Function Tests: No results for input(s): TSH, T4TOTAL, FREET4, T3FREE, THYROIDAB in the last 72 hours. Anemia Panel: No results for input(s): VITAMINB12, FOLATE, FERRITIN, TIBC, IRON, RETICCTPCT in the last 72 hours. Sepsis Labs: No results for input(s): PROCALCITON, LATICACIDVEN in the last 168 hours.  Recent Results (from the past 240 hour(s))  Urine culture     Status: None   Collection Time: 10/18/18 11:48 AM  Result Value Ref Range Status   Specimen Description URINE, RANDOM  Final   Special Requests   Final    NONE Performed at [facility], [address] [place], [place] [address]    Culture NO GROWTH  Final   Report Status 10/19/2018 FINAL  Final         Radiology Studies: No results found.      Scheduled Meds: . bethanechol  25 mg Oral BID  . diltiazem  30 mg Oral Q8H  . liver oil-zinc oxide   Topical 6 X Daily  . memantine  5 mg Oral BID  . metoprolol succinate  100 mg Oral Daily  . nystatin cream   Topical BID  . pregabalin  75 mg Oral QHS  .  sodium chloride flush  3 mL Intravenous Q12H  . vitamin B-12  500 mcg Oral Daily  . Warfarin - Pharmacist Dosing Inpatient   Does not apply q1800   Continuous Infusions:    LOS: 5 days    Time spent:35 mins. More than 50% of that time was spent in counseling  and/or coordination of care.      Shelly Coss, MD [facility] Pager [phone]  If 7PM-7AM, please contact night-coverage [email or web address] Password [email or web address] 10/23/2018, 1:27 PM

## 2018-10-24 DIAGNOSIS — R0609 Other forms of dyspnea: Secondary | ICD-10-CM

## 2018-10-24 DIAGNOSIS — L8990 Pressure ulcer of unspecified site, unspecified stage: Secondary | ICD-10-CM

## 2018-10-24 DIAGNOSIS — Z7901 Long term (current) use of anticoagulants: Secondary | ICD-10-CM

## 2018-10-24 DIAGNOSIS — I2699 Other pulmonary embolism without acute cor pulmonale: Secondary | ICD-10-CM

## 2018-10-24 LAB — BASIC METABOLIC PANEL
Anion gap: 12 (ref 5–15)
BUN: 44 mg/dL — ABNORMAL HIGH (ref 8–23)
CO2: 20 mmol/L — ABNORMAL LOW (ref 22–32)
Calcium: 8.7 mg/dL — ABNORMAL LOW (ref 8.9–10.3)
Chloride: 104 mmol/L (ref 98–111)
Creatinine, Ser: 1.7 mg/dL — ABNORMAL HIGH (ref 0.44–1.00)
GFR calc Af Amer: 31 mL/min — ABNORMAL LOW (ref >60)
GFR calc non Af Amer: 27 mL/min — ABNORMAL LOW (ref >60)
Glucose, Bld: 134 mg/dL — ABNORMAL HIGH (ref 70–99)
Potassium: 4.5 mmol/L (ref 3.5–5.1)
SODIUM: 136 mmol/L (ref 135–145)

## 2018-10-24 LAB — PROTIME-INR
INR: 3.55
Prothrombin Time: 35 seconds — ABNORMAL HIGH (ref 11.4–15.2)

## 2018-10-24 LAB — GLUCOSE, CAPILLARY: Glucose-Capillary: 111 mg/dL — ABNORMAL HIGH (ref 70–99)

## 2018-10-24 NOTE — Plan of Care (Signed)

## 2018-10-24 NOTE — Progress Notes (Signed)
Progress Note  Patient Name: [name] [name] Date of Encounter: 10/24/2018  Primary Cardiologist:   Thompson Grayer, MD   Subjective   No cardiac complaints alert     Inpatient Medications    Scheduled Meds: . bethanechol  25 mg Oral BID  . diltiazem  30 mg Oral Q8H  . feeding supplement (ENSURE ENLIVE)  237 mL Oral BID BM  . liver oil-zinc oxide   Topical 6 X Daily  . memantine  5 mg Oral BID  . metoprolol succinate  100 mg Oral Daily  . multivitamin with minerals  1 tablet Oral Daily  . nystatin cream   Topical BID  . pregabalin  75 mg Oral QHS  . sodium chloride flush  3 mL Intravenous Q12H  . vitamin B-12  500 mcg Oral Daily  . Warfarin - Pharmacist Dosing Inpatient   Does not apply q1800   Continuous Infusions:  PRN Meds: acetaminophen, dimethicone, senna-docusate   Vital Signs    Vitals:   10/23/18 1330 10/23/18 1704 10/23/18 1929 10/24/18 0408  BP: 114/74 (!) 106/59 108/73 98/81  Pulse: 87 74 (!) 44 92  Resp: 20 (!) 22 18 18   Temp: 97.8 F (36.6 C) 97.6 F (36.4 C) (!) 97.4 F (36.3 C) 98 F (36.7 C)  TempSrc: Oral Oral Oral   SpO2: 92% 100% 98% 94%  Weight:    101.7 kg  Height:        Intake/Output Summary (Last 24 hours) at 10/24/2018 0900 Last data filed at 10/24/2018 7353 Gross per 24 hour  Intake 363 ml  Output 2750 ml  Net -2387 ml   Filed Weights   10/21/18 0409 10/23/18 0511 10/24/18 0408  Weight: 95.3 kg 104.8 kg 101.7 kg    Telemetry    Atrial fib with elevated rate  100-120 bpm  - Personally Reviewed  ECG    NA - Personally Reviewed  Physical Exam   GEN: No  acute distress.   Neck: No  JVD Cardiac: Irregular RR, no murmurs, rubs, or gallops.  Respiratory: Clear to auscultation bilaterally. GI: Soft, nontender, non-distended, normal bowel sounds  MS:  No edema; No deformity. Neuro:   Nonfocal but diffusely weak.   Psych: Oriented and appropriate     Labs    Chemistry Recent Labs  Lab 10/18/18 1005   10/22/18 0500 10/23/18 0651 10/24/18 0421  NA 135   < > 134* 134* 136  K 4.3   < > 4.2 4.3 4.5  CL 98   < > 104 106 104  CO2 23   < > 19* 18* 20*  GLUCOSE 195*   < > 159* 123* 134*  BUN 40*   < > 44* 47* 44*  CREATININE 2.52*   < > 2.14* 1.99* 1.70*  CALCIUM 9.3   < > 9.1 8.8* 8.7*  PROT 7.7  --   --   --   --   ALBUMIN 2.6*  --   --  1.6*  --   AST 44*  --   --   --   --   ALT 26  --   --   --   --   ALKPHOS 123  --   --   --   --   BILITOT 3.1*  --   --   --   --   GFRNONAA 17*   < > 20* 22* 27*  GFRAA 19*   < > 24* 26* 31*  ANIONGAP 14   < >  11 10 12    < > = values in this interval not displayed.     Hematology Recent Labs  Lab 10/19/18 0528 10/20/18 0438 10/21/18 0441  WBC 11.5* 12.5* 10.2  RBC 4.31 4.76 4.56  HGB 11.7* 12.9 12.2  HCT 37.9 41.9 39.0  MCV 87.9 88.0 85.5  MCH 27.1 27.1 26.8  MCHC 30.9 30.8 31.3  RDW 18.8* 19.0* 19.1*  PLT 286 291 331    Cardiac EnzymesNo results for input(s): TROPONINI in the last 168 hours.  Recent Labs  Lab 10/18/18 1014  TROPIPOC 0.05     BNPNo results for input(s): BNP, PROBNP in the last 168 hours.   DDimer No results for input(s): DDIMER in the last 168 hours.   Radiology    No results found.  Cardiac Studies   Echo  Study Conclusions  - Left ventricle: The cavity size was normal. Wall thickness was   normal. The estimated ejection fraction was 45%. Images were   inadequate for LV wall motion assessment. The study was not   technically sufficient to allow evaluation of LV diastolic   dysfunction due to atrial fibrillation. - Ventricular septum: Mildly D-shaped interventricular septum   suggesting RV pressure/volume overload. - Aortic valve: There was no stenosis. There was trivial   regurgitation. - Mitral valve: Moderately calcified annulus. Mildly calcified   leaflets . There was mild regurgitation. - Left atrium: The atrium was moderately dilated. - Right ventricle: The cavity size was moderately  dilated. Pacer   wire or catheter noted in right ventricle. Systolic function was   mildly reduced. - Right atrium: The atrium was severely dilated. - Tricuspid valve: There was severe regurgitation. Peak RV-RA   gradient (S): 37 mm Hg. - Pulmonary arteries: PA peak pressure: 52 mm Hg (S). - Systemic veins: IVC measured 2.7 cm with < 50% respirophasic   variation, suggesting RA pressure 15 mmHg. - Pericardium, extracardiac: A trivial pericardial effusion was   identified.  Patient Profile     85 y.o. female with a hx of dementia, chronic diastolic CHF, chronic atrial fibrillation on warfarin, history of DVT, hypertension, CKD stage III, diabetes type 2, SSS s/p PPM 2010, chronic venous insufficiency pulmonary hypertension, hiatal hernia, breast cancer s/p left mastectomy and anxiety who is being seen for the evaluation of CHF at the request of Dr. Tawanna Solo.   Assessment & Plan    ACUTE ON CHRONIC SYSTOLIC AND DIASTOLIC HF:    Appears euvolemic continue current meds .   PERMANENT ATRIAL FIB:    Rate is still slightly high.  cardizem 30 q 8 added to Toprol 100 mg Yesterday by Dr Percival Spanish Consider increasing in am if rates still high   ACUTE ON CHRONIC STAGE IV CKD:  Creat better today 1.70  Agree with continuing to hold the Lasix.  HTN:   Stable     For questions or updates, please contact [place] Please consult [email or web address] for contact info under Cardiology/STEMI.   Signed, Jenkins Rouge, MD  10/24/2018, 9:00 AM

## 2018-10-24 NOTE — Progress Notes (Signed)
PROGRESS NOTE    [name] [name]  HWE:[id number] DOB: 1931/08/29 DOA: 10/18/2018 PCP: Hoyt Koch, MD   Brief Narrative: [name] [name] is a 85 y.o. female with history of atrial fibrillation on Coumadin, dementia, history of breast cancer status post left mastectomy who was brought by her family because she fell at home.  Reported to have been progressively weak for last 3 weeks. Also found to be dyspneaic at home. .  Found to be in acute kidney injury on presentation.Admitted for further work-up.  Echocardiogram showed reduced ejection fraction.  Cardiology following.  Hospital course remarkable for A. fib with RVR .  Cardiology following. Incidental finding of new pressure ulcers today.  Medical staff aware.  Risk management consulted. She has been denied skilled nursing facility by the her insurance saying that she might be more custodial than rehab.But she was reported to be doing well at home, ambulating, doing her usual activities.  Expedited rappelling  will be submitted to the insurance.SW aware. I also discussed with the daughter today.  Given her advanced age, comorbidities, history of breast cancer, dementia, she might be a candidate for home with hospice.Pallitiave care consulted.   Assessment & Plan:   Principal Problem:   Syncope Active Problems:   Acute lower UTI   History of pulmonary embolism   History of breast cancer   Pacemaker-[employer]   Goals of care, counseling/discussion   Chronic diastolic (congestive) heart failure (HCC)   Dyspnea on exertion   Diabetes mellitus type 2, noninsulin dependent (HCC)   A-fib (HCC)   Cognitive change   CKD (chronic kidney disease), stage IV (HCC)   Solitary pulmonary nodule on lung CT   Fall at home, initial encounter   Pulmonary embolism on long-term anticoagulation therapy ([place])   Syncope and collapse   Palliative care by specialist   Pressure injury of skin   Fall CT head without acute abnormalities.  C-spine without fracture. PT recommending SNF.  Chronic combined systolic and diastolic heart failure Currently on Lasix per cardiology. Transthoracic Echocardiogram significant for an EF of 45%.  -Cardiology management  Acute kidney injury on CKD stage III Baseline creatinine of 2.52. Peak creatinine of 1.7. Improving with diuresis, down to 1.7 today  Chronic atrial fibrillation with RVR Heart rate improving. Patient on Cardizem and metoprolol. -Cardiology management  Rhabdomyolsis Mild.  Thyroid nodule Family does not want further investigation.  Dementia Stable. -Continue Aricept  Urinary tract infection Urinalysis suggested infection. Urine culture with no growth. Patient received two days of ceftriaxone before discontinuation.  Candidal rash Treatment with Nystatin  Pressure injury Left buttock, right thigh, left labia. Not present on arrival.   DVT prophylaxis: Coumadin Code Status:   Code Status: Full Code Family Communication: Daughter at bedside Disposition Plan: Discharge to SNF likely in 2-3 days pending cardiology recommendations   Consultants:   Cardiology  Procedures:   Transthoracic Echocardiogram (1/20) Study Conclusions  - Left ventricle: The cavity size was normal. Wall thickness was   normal. The estimated ejection fraction was 45%. Images were   inadequate for LV wall motion assessment. The study was not   technically sufficient to allow evaluation of LV diastolic   dysfunction due to atrial fibrillation. - Ventricular septum: Mildly D-shaped interventricular septum   suggesting RV pressure/volume overload. - Aortic valve: There was no stenosis. There was trivial   regurgitation. - Mitral valve: Moderately calcified annulus. Mildly calcified   leaflets . There was mild regurgitation. - Left atrium: The atrium was moderately dilated. -  Right ventricle: The cavity size was moderately dilated. Pacer   wire or catheter noted in right  ventricle. Systolic function was   mildly reduced. - Right atrium: The atrium was severely dilated. - Tricuspid valve: There was severe regurgitation. Peak RV-RA   gradient (S): 37 mm Hg. - Pulmonary arteries: PA peak pressure: 52 mm Hg (S). - Systemic veins: IVC measured 2.7 cm with < 50% respirophasic   variation, suggesting RA pressure 15 mmHg. - Pericardium, extracardiac: A trivial pericardial effusion was   identified.  Impressions:  - The patient was in atrial fibrillation. Normal LV size with EF   45%. LV not well visualized, unable to comment well on individual   wall segments. Moderately dilated RV with mildly decreased   systolic function. D-shaped interventricular septum suggesting RV   pressure/volume overload. Severe TR. Moderate pulmonary   hypertension. Dilated IVC suggestive of elevated RV filling   pressure. Mild MR.  Antimicrobials:  Ceftriaxone (1/19>>1/20)    Subjective: No concerns today.  Objective: Vitals:   10/23/18 1704 10/23/18 1929 10/24/18 0408 10/24/18 1004  BP: (!) 106/59 108/73 98/81 107/81  Pulse: 74 (!) 44 92 79  Resp: (!) 22 18 18    Temp: 97.6 F (36.4 C) (!) 97.4 F (36.3 C) 98 F (36.7 C)   TempSrc: Oral Oral    SpO2: 100% 98% 94%   Weight:   101.7 kg   Height:        Intake/Output Summary (Last 24 hours) at 10/24/2018 1412 Last data filed at 10/24/2018 1017 Gross per 24 hour  Intake 483 ml  Output 3250 ml  Net -2767 ml   Filed Weights   10/21/18 0409 10/23/18 0511 10/24/18 0408  Weight: 95.3 kg 104.8 kg 101.7 kg    Examination:  General exam: Appears calm and comfortable Respiratory system: Clear to auscultation. Respiratory effort normal. Cardiovascular system: S1 & S2 heard, RRR. No murmurs, rubs, gallops or clicks. Gastrointestinal system: Abdomen is nondistended, soft and nontender. No organomegaly or masses felt. Normal bowel sounds heard. Central nervous system: Alert. No focal neurological  deficits. Extremities:  No calf tenderness Skin: No cyanosis. Psychiatry: Judgement and insight appear normal. Mood & affect appropriate.     Data Reviewed: I have personally reviewed following labs and imaging studies  CBC: Recent Labs  Lab 10/18/18 1005 10/18/18 1016 10/19/18 0528 10/20/18 0438 10/21/18 0441  WBC 9.1  --  11.5* 12.5* 10.2  NEUTROABS 8.0*  --   --  10.4* 8.3*  HGB 14.5 16.7* 11.7* 12.9 12.2  HCT 46.9* 49.0* 37.9 41.9 39.0  MCV 87.2  --  87.9 88.0 85.5  PLT 351  --  286 291 891   Basic Metabolic Panel: Recent Labs  Lab 10/20/18 0438 10/21/18 0441 10/22/18 0500 10/23/18 0651 10/24/18 0421  NA 139 138 134* 134* 136  K 4.3 3.9 4.2 4.3 4.5  CL 106 110 104 106 104  CO2 22 19* 19* 18* 20*  GLUCOSE 147* 148* 159* 123* 134*  BUN 39* 38* 44* 47* 44*  CREATININE 2.11* 2.01* 2.14* 1.99* 1.70*  CALCIUM 8.7* 8.6* 9.1 8.8* 8.7*   GFR: Estimated Creatinine Clearance: 32.3 mL/min (A) (by C-G formula based on SCr of 1.7 mg/dL (H)). Liver Function Tests: Recent Labs  Lab 10/18/18 1005 10/23/18 0651  AST 44*  --   ALT 26  --   ALKPHOS 123  --   BILITOT 3.1*  --   PROT 7.7  --   ALBUMIN 2.6* 1.6*  No results for input(s): LIPASE, AMYLASE in the last 168 hours. No results for input(s): AMMONIA in the last 168 hours. Coagulation Profile: Recent Labs  Lab 10/20/18 0438 10/21/18 0441 10/22/18 0500 10/23/18 0651 10/24/18 0421  INR 3.27 2.73 2.43 3.68 3.55   Cardiac Enzymes: Recent Labs  Lab 10/18/18 1005 10/19/18 0528  CKTOTAL 499* 283*   BNP (last 3 results) No results for input(s): PROBNP in the last 8760 hours. HbA1C: No results for input(s): HGBA1C in the last 72 hours. CBG: Recent Labs  Lab 10/20/18 0739 10/21/18 0731 10/22/18 0736 10/23/18 0837 10/24/18 0740  GLUCAP 137* 142* 144* 118* 111*   Lipid Profile: No results for input(s): CHOL, HDL, LDLCALC, TRIG, CHOLHDL, LDLDIRECT in the last 72 hours. Thyroid Function Tests: No  results for input(s): TSH, T4TOTAL, FREET4, T3FREE, THYROIDAB in the last 72 hours. Anemia Panel: No results for input(s): VITAMINB12, FOLATE, FERRITIN, TIBC, IRON, RETICCTPCT in the last 72 hours. Sepsis Labs: No results for input(s): PROCALCITON, LATICACIDVEN in the last 168 hours.  Recent Results (from the past 240 hour(s))  Urine culture     Status: None   Collection Time: 10/18/18 11:48 AM  Result Value Ref Range Status   Specimen Description URINE, RANDOM  Final   Special Requests   Final    NONE Performed at [facility], [address], [place], [place] [address]    Culture NO GROWTH  Final   Report Status 10/19/2018 FINAL  Final         Radiology Studies: No results found.      Scheduled Meds: . bethanechol  25 mg Oral BID  . diltiazem  30 mg Oral Q8H  . feeding supplement (ENSURE ENLIVE)  237 mL Oral BID BM  . liver oil-zinc oxide   Topical 6 X Daily  . memantine  5 mg Oral BID  . metoprolol succinate  100 mg Oral Daily  . multivitamin with minerals  1 tablet Oral Daily  . nystatin cream   Topical BID  . pregabalin  75 mg Oral QHS  . sodium chloride flush  3 mL Intravenous Q12H  . vitamin B-12  500 mcg Oral Daily  . Warfarin - Pharmacist Dosing Inpatient   Does not apply q1800   Continuous Infusions:   LOS: 6 days     Cordelia Poche, MD [facility] 10/24/2018, 2:12 PM  If 7PM-7AM, please contact night-coverage [email or web address]

## 2018-10-24 NOTE — Progress Notes (Signed)
ANTICOAGULATION CONSULT NOTE -follow up Pharmacy Consult for Coumadin Indication: h/o atrial fibrillation and history of PE (2006)  Allergies  Allergen Reactions  . Amiodarone Shortness Of Breath and Nausea Only    "Toxicity," also  . Flecainide Shortness Of Breath and Other (See Comments)    "Toxicity," also   . Morphine And Related Nausea And Vomiting and Other (See Comments)    Family requests no morphine due to past reaction. Pt stated it made her sick, severe nausea and vomiting  . Bactrim Ds [Sulfamethoxazole-Trimethoprim] Diarrhea  . Cephalexin Nausea And Vomiting  . Pineapple Itching    Severe itching    Patient Measurements: Height: 6' (182.9 cm) Weight: 224 lb 3.2 oz (101.7 kg) IBW/kg (Calculated) : 73.1  Vital Signs: Temp: 98 F (36.7 C) (01/25 0408) BP: 98/81 (01/25 0408) Pulse Rate: 92 (01/25 0408)  Labs: Recent Labs    10/22/18 0500 10/23/18 0651 10/24/18 0421  LABPROT 26.1* 35.9* 35.0*  INR 2.43 3.68 3.55  CREATININE 2.14* 1.99* 1.70*    Estimated Creatinine Clearance: 32.3 mL/min (A) (by C-G formula based on SCr of 1.7 mg/dL (H)).   Medical History: Past Medical History:  Diagnosis Date  . Acute gastric ulcer without mention of hemorrhage, perforation, or obstruction 2006   EGD  . Anxiety   . Atrial fibrillation (HCC)    flecainide; coumadin  . Breast cancer ([place])    left  . Carotid artery disease ([place])    10/31: RICA 5-94%, LICA 58-59%  //  [employer] [place] 29/24: RICA 4-62%; LICA 86-38% >> FU 1 year   . Chronic diastolic heart failure (HCC)    a. echo 10/10: mild LVH, EF 55-60%, mild AI, mild MR, mild to mod LAE, mild RVE, severe RAE, mod TR, PASP 53  /  b.  Echo 3/14:  Mild LVH, EF 55%, Tr AI, MAC, mild MR, mod LAE, PASP 31, trivial eff. // c. echo 1/17: EF 55-60%, mild AI, MAC, mild MR, moderate BAE, moderate TR  . Chronic lower back pain   . Diabetic peripheral neuropathy ([place]) 03/09/2015  . Diverticulosis   . Esophagitis, unspecified 2006   EGD  . Hiatal hernia 2006   EGD  . History of nuclear stress test    a. Myoview 2/12: EF 69%, no scar or ischemia  . Hx of adenomatous colonic polyps   . Hypertension   . Mediastinal lymphadenopathy   . Memory difficulty 11/14/2016  . Mitral regurgitation    mild, echo, October, 2010  . Osteopenia   . Presence of permanent cardiac pacemaker   . Pulmonary embolus [facility]) 2006   2006, with DVT  . Pulmonary HTN ([place])    53 mmHg echo, 2010, moderate TR, mild right ventricular enlargement  . Tachy-brady syndrome ([place])    s/p pacer 07/2009  . Thyroid nodule    non-neoplastic goiter  . Type II diabetes mellitus ([place])   . Venous insufficiency    Chronic   Assessment: 85 year old female found down at home after a fall. Patient was on chronic Coumadin therapy for atrial fibrillation and history of a PE (2006). INR on admission is therapeutic at 2.61. No overt bleeding noted on admission. Head CT was negative for bleeding. Pharmacy was consulted 10/18/18 to resume Coumadin therapy.   Home regimen was 2.60m every Monday and 1.25 all other days, last dose PTA was on 10/17/18 in the PM.   INR today is 3.55, slightly down from yesterday but still above goal. Previously INR was  supratherapeutic on 1/20, 1/21 then INR down within INR goal and low dose warfarin resumed 1/22 and 10/22/18. Possibly effected by decreased meal intake. No bleeding reported.  Goal of Therapy:  INR 2-3 Monitor platelets by anticoagulation protocol: Yes   Plan:  Hold Coumadin today  Daily PT/INR  Jackson Latino, PharmD PGY1 Pharmacy Resident 10/24/2018     8:48 AM  Please check AMION for all [place] phone numbers After 10:00 PM, call [place] [phone]

## 2018-10-25 LAB — PROTIME-INR
INR: 3.02
PROTHROMBIN TIME: 30.9 s — AB (ref 11.4–15.2)

## 2018-10-25 MED ORDER — WARFARIN SODIUM 1 MG PO TABS
1.0000 mg | ORAL_TABLET | Freq: Once | ORAL | Status: AC
  Administered 2018-10-25: 1 mg via ORAL
  Filled 2018-10-25: qty 1

## 2018-10-25 NOTE — Progress Notes (Signed)
CN and NT, [name] [name], went to bedside to rotate pt. Pt family at bedside stated he does not want to move pt at this time. Pt family stated after shift change so pt can finish eating dinner. Informed primary staff of family request  Primary RN aware

## 2018-10-25 NOTE — Progress Notes (Signed)
[facility] for Coumadin Indication: h/o atrial fibrillation and history of PE (2006)  Allergies  Allergen Reactions  . Amiodarone Shortness Of Breath and Nausea Only    "Toxicity," also  . Flecainide Shortness Of Breath and Other (See Comments)    "Toxicity," also   . Morphine And Related Nausea And Vomiting and Other (See Comments)    Family requests no morphine due to past reaction. Pt stated it made her sick, severe nausea and vomiting  . Bactrim Ds [Sulfamethoxazole-Trimethoprim] Diarrhea  . Cephalexin Nausea And Vomiting  . Pineapple Itching    Severe itching    Patient Measurements: Height: 6' (182.9 cm) Weight: 223 lb 9.6 oz (101.4 kg) IBW/kg (Calculated) : 73.1  Vital Signs: Temp: 98.6 F (37 C) (01/26 0435) Temp Source: Oral (01/26 0435) BP: 113/71 (01/26 0618) Pulse Rate: 105 (01/26 0618)  Labs: Recent Labs    10/23/18 0651 10/24/18 0421 10/25/18 0526  LABPROT 35.9* 35.0* 30.9*  INR 3.68 3.55 3.02  CREATININE 1.99* 1.70*  --     Estimated Creatinine Clearance: 32.2 mL/min (A) (by C-G formula based on SCr of 1.7 mg/dL (H)).   Medical History: Past Medical History:  Diagnosis Date  . Acute gastric ulcer without mention of hemorrhage, perforation, or obstruction 2006   EGD  . Anxiety   . Atrial fibrillation (HCC)    flecainide; coumadin  . Breast cancer ([place])    left  . Carotid artery disease ([place])    96/28: RICA 3-66%, LICA 29-47%  //  Car[place]ud [place] 65/46: RICA 5-03%; LICA 54-65% >> FU 1 year   . Chronic diastolic heart failure (HCC)    a. echo 10/10: mild LVH, EF 55-60%, mild AI, mild MR, mild to mod LAE, mild RVE, severe RAE, mod TR, PASP 53  /  b.  Echo 3/14:  Mild LVH, EF 55%, Tr AI, MAC, mild MR, mod LAE, PASP 31, trivial eff. // c. echo 1/17: EF 55-60%, mild AI, MAC, mild MR, moderate BAE, moderate TR  . Chronic lower back pain   . Diabetic peripheral neuropathy ([place]) 03/09/2015  . Diverticulosis   .  Esophagitis, unspecified 2006   EGD  . Hiatal hernia 2006   EGD  . History of nuclear stress test    a. Myoview 2/12: EF 69%, no scar or ischemia  . Hx of adenomatous colonic polyps   . Hypertension   . Mediastinal lymphadenopathy   . Memory difficulty 11/14/2016  . Mitral regurgitation    mild, echo, October, 2010  . Osteopenia   . Presence of permanent cardiac pacemaker   . Pulmonary embolus [facility] 2006   2006, with DVT  . Pulmonary HTN ([place])    53 mmHg echo, 2010, moderate TR, mild right ventricular enlargement  . Tachy-brady syndrome ([place])    s/p pacer 07/2009  . Thyroid nodule    non-neoplastic goiter  . Type II diabetes mellitus ([place])   . Venous insufficiency    Chronic   Assessment: 85 year old female found down at home after a fall. Patient was on chronic Coumadin therapy for atrial fibrillation and history of a PE (2006). INR on admission is therapeutic at 2.61. No overt bleeding noted on admission. Head CT was negative for bleeding. Pharmacy was consulted 10/18/18 to resume Coumadin therapy.   Home regimen was 2.75m every Monday and 1.25 all other days, last dose PTA was on 10/17/18 in the PM.   INR today is 3.02, down from yesterday but  still slightly above goal. Coumadin has been on hold for 2 days. Previous higher INR levels possibly effected by decreased meal intake. Patient now documented to be eating better and has been started on Ensure. No bleeding reported. Will give low dose of Coumadin today in anticipation that INR will continue to drop over next few days given increased meal intake and previously held doses.  Goal of Therapy:  INR 2-3 Monitor platelets by anticoagulation protocol: Yes   Plan:  Coumadin 1 mg PO x1 tonight  Daily PT/INR  Jackson Latino, PharmD PGY1 Pharmacy Resident 10/25/2018     8:16 AM  Please check AMION for all [place] phone numbers After 10:00 PM, call [place] [phone]

## 2018-10-25 NOTE — Progress Notes (Signed)
PROGRESS NOTE    [name] [name]  OHY:[id number] DOB: 1931-08-20 DOA: 10/18/2018 PCP: Hoyt Koch, MD   Brief Narrative: [name] [name] is a 85 y.o. female with history of atrial fibrillation on Coumadin, dementia, history of breast cancer status post left mastectomy who was brought by her family because she fell at home.  Reported to have been progressively weak for last 3 weeks. Also found to be dyspneaic at home. .  Found to be in acute kidney injury on presentation.Admitted for further work-up.  Echocardiogram showed reduced ejection fraction.  Cardiology following.  Hospital course remarkable for A. fib with RVR .  Cardiology following. Incidental finding of new pressure ulcers today.  Medical staff aware.  Risk management consulted. She has been denied skilled nursing facility by the her insurance saying that she might be more custodial than rehab.But she was reported to be doing well at home, ambulating, doing her usual activities.  Expedited rappelling  will be submitted to the insurance.SW aware. I also discussed with the daughter today.  Given her advanced age, comorbidities, history of breast cancer, dementia, she might be a candidate for home with hospice.Pallitiave care consulted.   Assessment & Plan:   Principal Problem:   Syncope Active Problems:   Acute lower UTI   History of pulmonary embolism   History of breast cancer   Pacemaker-[employer]tr[employer]   Goals of care, counseling/discussion   Chronic diastolic (congestive) heart failure (HCC)   Dyspnea on exertion   Diabetes mellitus type 2, noninsulin dependent (HCC)   A-fib (HCC)   Cognitive change   CKD (chronic kidney disease), stage IV (HCC)   Solitary pulmonary nodule on lung CT   Fall at home, initial encounter   Pulmonary embolism on long-term anticoagulation therapy ([place])   Syncope and collapse   Palliative care by specialist   Pressure injury of skin   Fall CT head without acute abnormalities.  C-spine without fracture. PT recommending SNF.  Chronic combined systolic and diastolic heart failure Currently on Lasix per cardiology. Transthoracic Echocardiogram significant for an EF of 45%.  -Cardiology management  Acute kidney injury on CKD stage III Baseline creatinine of 2.52. Peak creatinine of 1.7. Improving with diuresis, down to 1.7.  Chronic atrial fibrillation with RVR Heart rate still high. Patient on Cardizem and metoprolol. -Cardiology management  Rhabdomyolsis Mild.  Thyroid nodule Family does not want further investigation.  Dementia Stable. Appears more alert/oriented today -Continue Aricept  Urinary tract infection Urinalysis suggested infection. Urine culture with no growth. Patient received two days of ceftriaxone before discontinuation.  Candidal rash Treatment with Nystatin  Pressure injury Left buttock, right thigh, left labia. Not present on arrival.   DVT prophylaxis: Coumadin Code Status:   Code Status: Full Code Family Communication: Daughter at bedside Disposition Plan: Discharge to SNF likely in 1-2 days pending cardiology recommendations   Consultants:   Cardiology  Procedures:   Transthoracic Echocardiogram (1/20) Study Conclusions  - Left ventricle: The cavity size was normal. Wall thickness was   normal. The estimated ejection fraction was 45%. Images were   inadequate for LV wall motion assessment. The study was not   technically sufficient to allow evaluation of LV diastolic   dysfunction due to atrial fibrillation. - Ventricular septum: Mildly D-shaped interventricular septum   suggesting RV pressure/volume overload. - Aortic valve: There was no stenosis. There was trivial   regurgitation. - Mitral valve: Moderately calcified annulus. Mildly calcified   leaflets . There was mild regurgitation. - Left atrium: The  atrium was moderately dilated. - Right ventricle: The cavity size was moderately dilated. Pacer   wire or  catheter noted in right ventricle. Systolic function was   mildly reduced. - Right atrium: The atrium was severely dilated. - Tricuspid valve: There was severe regurgitation. Peak RV-RA   gradient (S): 37 mm Hg. - Pulmonary arteries: PA peak pressure: 52 mm Hg (S). - Systemic veins: IVC measured 2.7 cm with < 50% respirophasic   variation, suggesting RA pressure 15 mmHg. - Pericardium, extracardiac: A trivial pericardial effusion was   identified.  Impressions:  - The patient was in atrial fibrillation. Normal LV size with EF   45%. LV not well visualized, unable to comment well on individual   wall segments. Moderately dilated RV with mildly decreased   systolic function. D-shaped interventricular septum suggesting RV   pressure/volume overload. Severe TR. Moderate pulmonary   hypertension. Dilated IVC suggestive of elevated RV filling   pressure. Mild MR.  Antimicrobials:  Ceftriaxone (1/19>>1/20)    Subjective: No complaints today. No pain.  Objective: Vitals:   10/24/18 2153 10/25/18 0435 10/25/18 0618 10/25/18 1220  BP: (!) 119/93 107/72 113/71 128/87  Pulse: 78 92 (!) 105 (!) 101  Resp:  18  18  Temp:  98.6 F (37 C)  98.5 F (36.9 C)  TempSrc:  Oral  Oral  SpO2:  93%  93%  Weight:  101.4 kg    Height:        Intake/Output Summary (Last 24 hours) at 10/25/2018 1316 Last data filed at 10/25/2018 1008 Gross per 24 hour  Intake 760 ml  Output 1150 ml  Net -390 ml   Filed Weights   10/23/18 0511 10/24/18 0408 10/25/18 0435  Weight: 104.8 kg 101.7 kg 101.4 kg    Examination:  General exam: Appears calm and comfortable Respiratory system: Clear to auscultation. Respiratory effort normal. Cardiovascular system: S1 & S2 heard, Irregular rhythm with normal rate. No murmurs, rubs, gallops or clicks. Gastrointestinal system: Abdomen is nondistended, soft and nontender. No organomegaly or masses felt. Normal bowel sounds heard. Central nervous system: Alert and  oriented to person and place. No focal neurological deficits. Extremities: No edema. No calf tenderness Skin: No cyanosis. No rashes Psychiatry: Mood & affect appropriate.    Data Reviewed: I have personally reviewed following labs and imaging studies  CBC: Recent Labs  Lab 10/19/18 0528 10/20/18 0438 10/21/18 0441  WBC 11.5* 12.5* 10.2  NEUTROABS  --  10.4* 8.3*  HGB 11.7* 12.9 12.2  HCT 37.9 41.9 39.0  MCV 87.9 88.0 85.5  PLT 286 291 762   Basic Metabolic Panel: Recent Labs  Lab 10/20/18 0438 10/21/18 0441 10/22/18 0500 10/23/18 0651 10/24/18 0421  NA 139 138 134* 134* 136  K 4.3 3.9 4.2 4.3 4.5  CL 106 110 104 106 104  CO2 22 19* 19* 18* 20*  GLUCOSE 147* 148* 159* 123* 134*  BUN 39* 38* 44* 47* 44*  CREATININE 2.11* 2.01* 2.14* 1.99* 1.70*  CALCIUM 8.7* 8.6* 9.1 8.8* 8.7*   GFR: Estimated Creatinine Clearance: 32.2 mL/min (A) (by C-G formula based on SCr of 1.7 mg/dL (H)). Liver Function Tests: Recent Labs  Lab 10/23/18 0651  ALBUMIN 1.6*   No results for input(s): LIPASE, AMYLASE in the last 168 hours. No results for input(s): AMMONIA in the last 168 hours. Coagulation Profile: Recent Labs  Lab 10/21/18 0441 10/22/18 0500 10/23/18 0651 10/24/18 0421 10/25/18 0526  INR 2.73 2.43 3.68 3.55 3.02   Cardiac  Enzymes: Recent Labs  Lab 10/19/18 0528  CKTOTAL 283*   BNP (last 3 results) No results for input(s): PROBNP in the last 8760 hours. HbA1C: No results for input(s): HGBA1C in the last 72 hours. CBG: Recent Labs  Lab 10/20/18 0739 10/21/18 0731 10/22/18 0736 10/23/18 0837 10/24/18 0740  GLUCAP 137* 142* 144* 118* 111*   Lipid Profile: No results for input(s): CHOL, HDL, LDLCALC, TRIG, CHOLHDL, LDLDIRECT in the last 72 hours. Thyroid Function Tests: No results for input(s): TSH, T4TOTAL, FREET4, T3FREE, THYROIDAB in the last 72 hours. Anemia Panel: No results for input(s): VITAMINB12, FOLATE, FERRITIN, TIBC, IRON, RETICCTPCT in the  last 72 hours. Sepsis Labs: No results for input(s): PROCALCITON, LATICACIDVEN in the last 168 hours.  Recent Results (from the past 240 hour(s))  Urine culture     Status: None   Collection Time: 10/18/18 11:48 AM  Result Value Ref Range Status   Specimen Description URINE, RANDOM  Final   Special Requests   Final    NONE Performed at [facility], [address] [place], [place] [address]    Culture NO GROWTH  Final   Report Status 10/19/2018 FINAL  Final         Radiology Studies: No results found.      Scheduled Meds: . bethanechol  25 mg Oral BID  . diltiazem  30 mg Oral Q8H  . feeding supplement (ENSURE ENLIVE)  237 mL Oral BID BM  . liver oil-zinc oxide   Topical 6 X Daily  . memantine  5 mg Oral BID  . metoprolol succinate  100 mg Oral Daily  . multivitamin with minerals  1 tablet Oral Daily  . nystatin cream   Topical BID  . pregabalin  75 mg Oral QHS  . sodium chloride flush  3 mL Intravenous Q12H  . vitamin B-12  500 mcg Oral Daily  . warfarin  1 mg Oral ONCE-1800  . Warfarin - Pharmacist Dosing Inpatient   Does not apply q1800   Continuous Infusions:   LOS: 7 days     Cordelia Poche, MD [facility] 10/25/2018, 1:16 PM  If 7PM-7AM, please contact night-coverage [email or web address]

## 2018-10-25 NOTE — Progress Notes (Signed)
Pt family came to the desk stated pt was ready to be turned  NT and CN rotated pt Primary staff aware

## 2018-10-25 NOTE — Progress Notes (Signed)
Pt family came to desk, requesting pt be placed on a softer diet with chopped meats.  Informed MD  MD aware and will change diet to dysphasia

## 2018-10-26 DIAGNOSIS — I4811 Longstanding persistent atrial fibrillation: Secondary | ICD-10-CM

## 2018-10-26 DIAGNOSIS — N184 Chronic kidney disease, stage 4 (severe): Secondary | ICD-10-CM

## 2018-10-26 DIAGNOSIS — I5032 Chronic diastolic (congestive) heart failure: Secondary | ICD-10-CM

## 2018-10-26 LAB — BASIC METABOLIC PANEL
Anion gap: 8 (ref 5–15)
BUN: 30 mg/dL — ABNORMAL HIGH (ref 8–23)
CO2: 19 mmol/L — AB (ref 22–32)
Calcium: 8.8 mg/dL — ABNORMAL LOW (ref 8.9–10.3)
Chloride: 110 mmol/L (ref 98–111)
Creatinine, Ser: 1.27 mg/dL — ABNORMAL HIGH (ref 0.44–1.00)
GFR calc Af Amer: 45 mL/min — ABNORMAL LOW (ref >60)
GFR calc non Af Amer: 38 mL/min — ABNORMAL LOW (ref >60)
Glucose, Bld: 133 mg/dL — ABNORMAL HIGH (ref 70–99)
Potassium: 4.3 mmol/L (ref 3.5–5.1)
Sodium: 137 mmol/L (ref 135–145)

## 2018-10-26 LAB — PROTIME-INR
INR: 2.72
Prothrombin Time: 28.4 seconds — ABNORMAL HIGH (ref 11.4–15.2)

## 2018-10-26 MED ORDER — DILTIAZEM HCL ER COATED BEADS 120 MG PO CP24
120.0000 mg | ORAL_CAPSULE | Freq: Every day | ORAL | Status: DC
  Administered 2018-10-26 – 2018-10-27 (×2): 120 mg via ORAL
  Filled 2018-10-26 (×2): qty 1

## 2018-10-26 MED ORDER — WARFARIN SODIUM 2.5 MG PO TABS
2.5000 mg | ORAL_TABLET | Freq: Once | ORAL | Status: AC
  Administered 2018-10-26: 2.5 mg via ORAL
  Filled 2018-10-26: qty 1

## 2018-10-26 MED ORDER — DILTIAZEM HCL ER COATED BEADS 120 MG PO TB24
120.0000 mg | ORAL_TABLET | Freq: Every day | ORAL | Status: DC
  Filled 2018-10-26 (×2): qty 1

## 2018-10-26 NOTE — Progress Notes (Signed)
[name] [name] for Coumadin Indication: h/o atrial fibrillation and history of PE (2006)  Allergies  Allergen Reactions  . Amiodarone Shortness Of Breath and Nausea Only    "Toxicity," also  . Flecainide Shortness Of Breath and Other (See Comments)    "Toxicity," also   . Morphine And Related Nausea And Vomiting and Other (See Comments)    Family requests no morphine due to past reaction. Pt stated it made her sick, severe nausea and vomiting  . Bactrim Ds [Sulfamethoxazole-Trimethoprim] Diarrhea  . Cephalexin Nausea And Vomiting  . Pineapple Itching    Severe itching    Patient Measurements: Height: 6' (182.9 cm) Weight: 235 lb (106.6 kg) IBW/kg (Calculated) : 73.1  Vital Signs: Temp: 97.7 F (36.5 C) (01/27 0418) Temp Source: Oral (01/27 0418) BP: 110/64 (01/27 0620) Pulse Rate: 85 (01/27 0620)  Labs: Recent Labs    10/24/18 0421 10/25/18 0526 10/26/18 0438  LABPROT 35.0* 30.9* 28.4*  INR 3.55 3.02 2.72  CREATININE 1.70*  --   --     Estimated Creatinine Clearance: 33 mL/min (A) (by C-G formula based on SCr of 1.7 mg/dL (H)).   Medical History: Past Medical History:  Diagnosis Date  . Acute gastric ulcer without mention of hemorrhage, perforation, or obstruction 2006   EGD  . Anxiety   . Atrial fibrillation (HCC)    flecainide; coumadin  . Breast cancer ([place])    left  . Carotid artery disease ([place])    35/59: RICA 7-41%, LICA 63-84%  //  [place] 53/64: RICA 6-80%; LICA 32-12% >> FU 1 year   . Chronic diastolic heart failure (HCC)    a. echo 10/10: mild LVH, EF 55-60%, mild AI, mild MR, mild to mod LAE, mild RVE, severe RAE, mod TR, PASP 53  /  b.  Echo 3/14:  Mild LVH, EF 55%, Tr AI, MAC, mild MR, mod LAE, PASP 31, trivial eff. // c. echo 1/17: EF 55-60%, mild AI, MAC, mild MR, moderate BAE, moderate TR  . Chronic lower back pain   . Diabetic peripheral neuropathy ([place]) 03/09/2015  . Diverticulosis   . Esophagitis,  unspecified 2006   EGD  . Hiatal hernia 2006   EGD  . History of nuclear stress test    a. Myoview 2/12: EF 69%, no scar or ischemia  . Hx of adenomatous colonic polyps   . Hypertension   . Mediastinal lymphadenopathy   . Memory difficulty 11/14/2016  . Mitral regurgitation    mild, echo, October, 2010  . Osteopenia   . Presence of permanent cardiac pacemaker   . Pulmonary embolus [facility]) 2006   2006, with DVT  . Pulmonary HTN ([place])    53 mmHg echo, 2010, moderate TR, mild right ventricular enlargement  . Tachy-brady syndrome ([place])    s/p pacer 07/2009  . Thyroid nodule    non-neoplastic goiter  . Type II diabetes mellitus ([place])   . Venous insufficiency    Chronic   Assessment: 85 year old female found down at home after a fall. Patient was on chronic Coumadin therapy for atrial fibrillation and history of a PE (2006). INR on admission is therapeutic at 2.61. No overt bleeding noted on admission. Head CT was negative for bleeding. Pharmacy was consulted 10/18/18 to resume Coumadin therapy.   Home regimen was 2.35m every Monday and 1.25 all other days, last dose PTA was on 10/17/18 in the PM.   INR today decreased to 2.72 from 3.02, now  within goal range. Coumadin had been on hold for 2 days prior and received reduced dose of 1 mg last night. Previous higher INR levels possibly effected by decreased meal intake. Patient now documented to be eating better and has been started on Ensure (100% meal intake documented yesterday). No bleeding reported.   Goal of Therapy:  INR 2-3 Monitor platelets by anticoagulation protocol: Yes   Plan:  Coumadin 2.5 mg PO x1 tonight given anticipate INR dropping with meal intake increasing and previous held doses Daily PT/INR  Antonietta Jewel, PharmD, BCCCP Clinical Pharmacist  Pager: [phone] Phone: [phone] 10/26/2018     8:16 AM  Please check AMION for all [place] phone numbers After 10:00 PM, call [place] [phone]

## 2018-10-26 NOTE — Care Management Important Message (Signed)
Important Message  Patient Details  Name: [name] [name] MRN: [id number] Date of Birth: 1931/09/11   Medicare Important Message Given:  Yes    Shaylon Aden P Khyla Mccumbers 10/26/2018, 12:10 PM

## 2018-10-26 NOTE — Progress Notes (Signed)
PROGRESS NOTE    [name] [name]  UQJ:[id number] DOB: 08-31-1931 DOA: 10/18/2018 PCP: Hoyt Koch, MD   Brief Narrative: [name] [name] is a 85 y.o. female with history of atrial fibrillation on Coumadin, dementia, history of breast cancer status post left mastectomy who was brought by her family because she fell at home.  Reported to have been progressively weak for last 3 weeks. Also found to be dyspneaic at home. .  Found to be in acute kidney injury on presentation.Admitted for further work-up.  Echocardiogram showed reduced ejection fraction.  Cardiology following.  Hospital course remarkable for A. fib with RVR .  Cardiology following. Incidental finding of new pressure ulcers today.  Medical staff aware.  Risk management consulted. She has been denied skilled nursing facility by the her insurance saying that she might be more custodial than rehab.But she was reported to be doing well at home, ambulating, doing her usual activities.  Expedited rappelling  will be submitted to the insurance.SW aware. I also discussed with the daughter today.  Given her advanced age, comorbidities, history of breast cancer, dementia, she might be a candidate for home with hospice.Pallitiave care consulted.   Assessment & Plan:   Principal Problem:   Syncope Active Problems:   Acute lower UTI   History of pulmonary embolism   History of breast cancer   Pacemaker-[employer]   Goals of care, counseling/discussion   Chronic diastolic (congestive) heart failure (HCC)   Dyspnea on exertion   Diabetes mellitus type 2, noninsulin dependent (HCC)   A-fib (HCC)   Cognitive change   CKD (chronic kidney disease), stage IV (HCC)   Solitary pulmonary nodule on lung CT   Fall at home, initial encounter   Pulmonary embolism on long-term anticoagulation therapy ([place])   Syncope and collapse   Palliative care by specialist   Pressure injury of skin   Fall CT head without acute abnormalities.  C-spine without fracture. PT recommending SNF.  Chronic combined systolic and diastolic heart failure Was on Lasix per cardiology. Transthoracic Echocardiogram significant for an EF of 45%.  -Cardiology management  Acute kidney injury on CKD stage III Baseline creatinine of 2.52. Peak creatinine of 1.7. Improved with diuresis, down to 1.7.  Chronic atrial fibrillation with RVR Still with tachycardia Patient on Cardizem and metoprolol. Cardiology signed off. -Cardiology management  Rhabdomyolsis Mild.  Thyroid nodule Family does not want further investigation.  Dementia Stable. Appears more alert/oriented today -Continue Aricept  Urinary tract infection Urinalysis suggested infection. Urine culture with no growth. Patient received two days of ceftriaxone before discontinuation.  Candidal rash Treatment with Nystatin  Pressure injury Left buttock, right thigh, left labia. Not present on arrival.  Debility Patient requiring max assist for transfers. Cannot be managed by family at home as her daughter is her main available family. Patient was previously functional using her Rollator at home.   DVT prophylaxis: Coumadin Code Status:   Code Status: Full Code Family Communication: Daughter at bedside Disposition Plan: Discharge to SNF pending insurance authorization   Consultants:   Cardiology  Procedures:   Transthoracic Echocardiogram (1/20) Study Conclusions  - Left ventricle: The cavity size was normal. Wall thickness was   normal. The estimated ejection fraction was 45%. Images were   inadequate for LV wall motion assessment. The study was not   technically sufficient to allow evaluation of LV diastolic   dysfunction due to atrial fibrillation. - Ventricular septum: Mildly D-shaped interventricular septum   suggesting RV pressure/volume overload. - Aortic valve:  There was no stenosis. There was trivial   regurgitation. - Mitral valve: Moderately calcified  annulus. Mildly calcified   leaflets . There was mild regurgitation. - Left atrium: The atrium was moderately dilated. - Right ventricle: The cavity size was moderately dilated. Pacer   wire or catheter noted in right ventricle. Systolic function was   mildly reduced. - Right atrium: The atrium was severely dilated. - Tricuspid valve: There was severe regurgitation. Peak RV-RA   gradient (S): 37 mm Hg. - Pulmonary arteries: PA peak pressure: 52 mm Hg (S). - Systemic veins: IVC measured 2.7 cm with < 50% respirophasic   variation, suggesting RA pressure 15 mmHg. - Pericardium, extracardiac: A trivial pericardial effusion was   identified.  Impressions:  - The patient was in atrial fibrillation. Normal LV size with EF   45%. LV not well visualized, unable to comment well on individual   wall segments. Moderately dilated RV with mildly decreased   systolic function. D-shaped interventricular septum suggesting RV   pressure/volume overload. Severe TR. Moderate pulmonary   hypertension. Dilated IVC suggestive of elevated RV filling   pressure. Mild MR.  Antimicrobials:  Ceftriaxone (1/19>>1/20)    Subjective: Tired. No concerns  Objective: Vitals:   10/25/18 2016 10/25/18 2300 10/26/18 0418 10/26/18 0620  BP: 114/73 124/69 121/63 110/64  Pulse: 94 93 (!) 103 85  Resp: 18  18   Temp: 98.5 F (36.9 C)  97.7 F (36.5 C)   TempSrc: Oral  Oral   SpO2: 94% 95% 97%   Weight:   106.6 kg   Height:        Intake/Output Summary (Last 24 hours) at 10/26/2018 0947 Last data filed at 10/26/2018 0419 Gross per 24 hour  Intake 480 ml  Output 1600 ml  Net -1120 ml   Filed Weights   10/24/18 0408 10/25/18 0435 10/26/18 0418  Weight: 101.7 kg 101.4 kg 106.6 kg    Examination:  General exam: Appears calm and comfortable  Respiratory system: Clear to auscultation. Respiratory effort normal. Cardiovascular system: S1 & S2 heard, irregular rhythm with fast rate. No murmurs, rubs,  gallops or clicks. Gastrointestinal system: Abdomen is nondistended, soft and nontender. No organomegaly or masses felt. Normal bowel sounds heard. Central nervous system: Sleeping but arouses easily and alert. Extremities: Trace edema. No calf tenderness Skin: No cyanosis. No rashes Psychiatry: Judgement and insight appear normal. Mood & affect appropriate.    Data Reviewed: I have personally reviewed following labs and imaging studies  CBC: Recent Labs  Lab 10/20/18 0438 10/21/18 0441  WBC 12.5* 10.2  NEUTROABS 10.4* 8.3*  HGB 12.9 12.2  HCT 41.9 39.0  MCV 88.0 85.5  PLT 291 202   Basic Metabolic Panel: Recent Labs  Lab 10/20/18 0438 10/21/18 0441 10/22/18 0500 10/23/18 0651 10/24/18 0421  NA 139 138 134* 134* 136  K 4.3 3.9 4.2 4.3 4.5  CL 106 110 104 106 104  CO2 22 19* 19* 18* 20*  GLUCOSE 147* 148* 159* 123* 134*  BUN 39* 38* 44* 47* 44*  CREATININE 2.11* 2.01* 2.14* 1.99* 1.70*  CALCIUM 8.7* 8.6* 9.1 8.8* 8.7*   GFR: Estimated Creatinine Clearance: 33 mL/min (A) (by C-G formula based on SCr of 1.7 mg/dL (H)). Liver Function Tests: Recent Labs  Lab 10/23/18 0651  ALBUMIN 1.6*   No results for input(s): LIPASE, AMYLASE in the last 168 hours. No results for input(s): AMMONIA in the last 168 hours. Coagulation Profile: Recent Labs  Lab 10/22/18 0500 10/23/18  9476 10/24/18 0421 10/25/18 0526 10/26/18 0438  INR 2.43 3.68 3.55 3.02 2.72   Cardiac Enzymes: No results for input(s): CKTOTAL, CKMB, CKMBINDEX, TROPONINI in the last 168 hours. BNP (last 3 results) No results for input(s): PROBNP in the last 8760 hours. HbA1C: No results for input(s): HGBA1C in the last 72 hours. CBG: Recent Labs  Lab 10/20/18 0739 10/21/18 0731 10/22/18 0736 10/23/18 0837 10/24/18 0740  GLUCAP 137* 142* 144* 118* 111*   Lipid Profile: No results for input(s): CHOL, HDL, LDLCALC, TRIG, CHOLHDL, LDLDIRECT in the last 72 hours. Thyroid Function Tests: No results  for input(s): TSH, T4TOTAL, FREET4, T3FREE, THYROIDAB in the last 72 hours. Anemia Panel: No results for input(s): VITAMINB12, FOLATE, FERRITIN, TIBC, IRON, RETICCTPCT in the last 72 hours. Sepsis Labs: No results for input(s): PROCALCITON, LATICACIDVEN in the last 168 hours.  Recent Results (from the past 240 hour(s))  Urine culture     Status: None   Collection Time: 10/18/18 11:48 AM  Result Value Ref Range Status   Specimen Description URINE, RANDOM  Final   Special Requests   Final    NONE Performed at [facility], [address], [place], [place] [address]    Culture NO GROWTH  Final   Report Status 10/19/2018 FINAL  Final         Radiology Studies: No results found.      Scheduled Meds: . bethanechol  25 mg Oral BID  . diltiazem  120 mg Oral Daily  . feeding supplement (ENSURE ENLIVE)  237 mL Oral BID BM  . liver oil-zinc oxide   Topical 6 X Daily  . memantine  5 mg Oral BID  . metoprolol succinate  100 mg Oral Daily  . multivitamin with minerals  1 tablet Oral Daily  . nystatin cream   Topical BID  . pregabalin  75 mg Oral QHS  . sodium chloride flush  3 mL Intravenous Q12H  . vitamin B-12  500 mcg Oral Daily  . warfarin  2.5 mg Oral ONCE-1800  . Warfarin - Pharmacist Dosing Inpatient   Does not apply q1800   Continuous Infusions:   LOS: 8 days     Cordelia Poche, MD [facility] 10/26/2018, 9:47 AM  If 7PM-7AM, please contact night-coverage [email or web address]

## 2018-10-26 NOTE — Clinical Social Work Note (Addendum)
Received call from [place] with [employer] who stated she is working on patient's appeal. She requested CSW fax over most recent MD progress note, RN notes, PT/OT notes, and medication list. She said we should have an answer within around 2 hours after she has received the clinicals.  Dayton Scrape, [place] [phone]  2:32 pm Left voicemail for Cecille Rubin to check status of authorization.  Dayton Scrape, [place] [phone]  5:05 pm Insurance authorization approved for discharge to [facility]. Will plan for tomorrow since it is already late in the day. Daughter is aware.  Dayton Scrape, [place]

## 2018-10-26 NOTE — Progress Notes (Signed)
Progress Note  Patient Name: [name] [name] Date of Encounter: 10/26/2018  Primary Cardiologist:   Thompson Grayer, MD  Subjective   No issues overnight- HR remains elevated in afib. Pacer in place. INR therapeutic. Off lasix. No BMET since 1/25.  Inpatient Medications    Scheduled Meds: . bethanechol  25 mg Oral BID  . diltiazem  30 mg Oral Q8H  . feeding supplement (ENSURE ENLIVE)  237 mL Oral BID BM  . liver oil-zinc oxide   Topical 6 X Daily  . memantine  5 mg Oral BID  . metoprolol succinate  100 mg Oral Daily  . multivitamin with minerals  1 tablet Oral Daily  . nystatin cream   Topical BID  . pregabalin  75 mg Oral QHS  . sodium chloride flush  3 mL Intravenous Q12H  . vitamin B-12  500 mcg Oral Daily  . warfarin  2.5 mg Oral ONCE-1800  . Warfarin - Pharmacist Dosing Inpatient   Does not apply q1800   Continuous Infusions:  PRN Meds: acetaminophen, dimethicone, senna-docusate   Vital Signs    Vitals:   10/25/18 2016 10/25/18 2300 10/26/18 0418 10/26/18 0620  BP: 114/73 124/69 121/63 110/64  Pulse: 94 93 (!) 103 85  Resp: 18  18   Temp: 98.5 F (36.9 C)  97.7 F (36.5 C)   TempSrc: Oral  Oral   SpO2: 94% 95% 97%   Weight:   106.6 kg   Height:        Intake/Output Summary (Last 24 hours) at 10/26/2018 0839 Last data filed at 10/26/2018 0419 Gross per 24 hour  Intake 480 ml  Output 1600 ml  Net -1120 ml   Filed Weights   10/24/18 0408 10/25/18 0435 10/26/18 0418  Weight: 101.7 kg 101.4 kg 106.6 kg    Telemetry    Atrial fib with elevated rate  100-120 bpm  - Personally Reviewed  ECG    NA - Personally Reviewed  Physical Exam   General appearance: alert and no distress Lungs: clear to auscultation bilaterally Heart: irregularly irregular rhythm and tachycardic Neurologic: Mental status: sleepy, but arouses to voice   Labs    Chemistry Recent Labs  Lab 10/22/18 0500 10/23/18 0651 10/24/18 0421  NA 134* 134* 136  K 4.2 4.3 4.5    CL 104 106 104  CO2 19* 18* 20*  GLUCOSE 159* 123* 134*  BUN 44* 47* 44*  CREATININE 2.14* 1.99* 1.70*  CALCIUM 9.1 8.8* 8.7*  ALBUMIN  --  1.6*  --   GFRNONAA 20* 22* 27*  GFRAA 24* 26* 31*  ANIONGAP 11 10 12      Hematology Recent Labs  Lab 10/20/18 0438 10/21/18 0441  WBC 12.5* 10.2  RBC 4.76 4.56  HGB 12.9 12.2  HCT 41.9 39.0  MCV 88.0 85.5  MCH 27.1 26.8  MCHC 30.8 31.3  RDW 19.0* 19.1*  PLT 291 331    Cardiac EnzymesNo results for input(s): TROPONINI in the last 168 hours.  No results for input(s): TROPIPOC in the last 168 hours.   BNPNo results for input(s): BNP, PROBNP in the last 168 hours.   DDimer No results for input(s): DDIMER in the last 168 hours.   Radiology    No results found.  Cardiac Studies   Echo  Study Conclusions  - Left ventricle: The cavity size was normal. Wall thickness was   normal. The estimated ejection fraction was 45%. Images were   inadequate for LV wall motion assessment.  The study was not   technically sufficient to allow evaluation of LV diastolic   dysfunction due to atrial fibrillation. - Ventricular septum: Mildly D-shaped interventricular septum   suggesting RV pressure/volume overload. - Aortic valve: There was no stenosis. There was trivial   regurgitation. - Mitral valve: Moderately calcified annulus. Mildly calcified   leaflets . There was mild regurgitation. - Left atrium: The atrium was moderately dilated. - Right ventricle: The cavity size was moderately dilated. Pacer   wire or catheter noted in right ventricle. Systolic function was   mildly reduced. - Right atrium: The atrium was severely dilated. - Tricuspid valve: There was severe regurgitation. Peak RV-RA   gradient (S): 37 mm Hg. - Pulmonary arteries: PA peak pressure: 52 mm Hg (S). - Systemic veins: IVC measured 2.7 cm with < 50% respirophasic   variation, suggesting RA pressure 15 mmHg. - Pericardium, extracardiac: A trivial pericardial  effusion was   identified.  Patient Profile     86 y.o. female with a hx of dementia, chronic diastolic CHF, chronic atrial fibrillation on warfarin, history of DVT, hypertension, CKD stage III, diabetes type 2, SSS s/p PPM 2010, chronic venous insufficiency pulmonary hypertension, hiatal hernia, breast cancer s/p left mastectomy and anxiety who is being seen for the evaluation of CHF at the request of Dr. Tawanna Solo.  Assessment & Plan    ACUTE ON CHRONIC SYSTOLIC AND DIASTOLIC HF:    Appears euvolemic - lasix on hold.   PERMANENT ATRIAL FIB:  Rate remains fast at times. On Toprol XL 100 mg daily and diltiazem 30 mg q8hrs. Will change to diltiazem LA 120 daily.    ACUTE ON CHRONIC STAGE IV CKD:  Creat improved - no checks over the weekend. Needs repeat BMET per primary service - consider restart lasix at lower dose, ?40 mg po daily - once creatinine stabilizes at baseline.  HTN:   Stable  - well controlled.  [facility] will sign off.   Medication Recommendations:  Toprol XL 100 mg daily, Diltiazem LA 120 mg daily, restart lasix at lower dose, ?40 mg daily (at home was on 80 mg po BID) Other recommendations (labs, testing, etc):  None Follow up as an outpatient:  Dr. Rayann Heman  For questions or updates, please contact [place] Please consult [email or web address] for contact info under Cardiology/STEMI.  Pixie Casino, MD, [facility], [place] Director of the Advanced Lipid Disorders &  Cardiovascular Risk Reduction Clinic Diplomate of the American Board of Clinical Lipidology Attending Cardiologist  Direct Dial: [phone]  Fax: [phone]  Website:  [email or web address]  Pixie Casino, MD  10/26/2018, 8:39 AM

## 2018-10-26 NOTE — Progress Notes (Signed)
PIV consult: Pt currently has no IV meds ordered.  Discussed with Melva, RN: Hold off on new IV for now. She will re-enter consult if access is needed.

## 2018-10-26 NOTE — Progress Notes (Signed)
Patient was refusing to be turned. Nursing leadership called POA on the phone to ask permission to turn patient despite her refusal due to her dementia. Daughter in agreement. Patient was agreeable and was made aware that daughter had been consulted and in agreement with the plan of care. Patient was pleasant.

## 2018-10-27 DIAGNOSIS — M6282 Rhabdomyolysis: Secondary | ICD-10-CM | POA: Diagnosis not present

## 2018-10-27 DIAGNOSIS — Z7401 Bed confinement status: Secondary | ICD-10-CM | POA: Diagnosis not present

## 2018-10-27 DIAGNOSIS — I5023 Acute on chronic systolic (congestive) heart failure: Secondary | ICD-10-CM | POA: Diagnosis not present

## 2018-10-27 DIAGNOSIS — R278 Other lack of coordination: Secondary | ICD-10-CM | POA: Diagnosis not present

## 2018-10-27 DIAGNOSIS — I4891 Unspecified atrial fibrillation: Secondary | ICD-10-CM | POA: Diagnosis not present

## 2018-10-27 DIAGNOSIS — M2550 Pain in unspecified joint: Secondary | ICD-10-CM | POA: Diagnosis not present

## 2018-10-27 DIAGNOSIS — R41841 Cognitive communication deficit: Secondary | ICD-10-CM | POA: Diagnosis not present

## 2018-10-27 DIAGNOSIS — I480 Paroxysmal atrial fibrillation: Secondary | ICD-10-CM | POA: Diagnosis not present

## 2018-10-27 DIAGNOSIS — N178 Other acute kidney failure: Secondary | ICD-10-CM | POA: Diagnosis not present

## 2018-10-27 DIAGNOSIS — N390 Urinary tract infection, site not specified: Secondary | ICD-10-CM | POA: Diagnosis not present

## 2018-10-27 DIAGNOSIS — R1312 Dysphagia, oropharyngeal phase: Secondary | ICD-10-CM | POA: Diagnosis not present

## 2018-10-27 DIAGNOSIS — I2699 Other pulmonary embolism without acute cor pulmonale: Secondary | ICD-10-CM | POA: Diagnosis not present

## 2018-10-27 DIAGNOSIS — E119 Type 2 diabetes mellitus without complications: Secondary | ICD-10-CM | POA: Diagnosis not present

## 2018-10-27 DIAGNOSIS — R55 Syncope and collapse: Secondary | ICD-10-CM | POA: Diagnosis not present

## 2018-10-27 DIAGNOSIS — Z7409 Other reduced mobility: Secondary | ICD-10-CM | POA: Diagnosis not present

## 2018-10-27 DIAGNOSIS — R2689 Other abnormalities of gait and mobility: Secondary | ICD-10-CM | POA: Diagnosis not present

## 2018-10-27 DIAGNOSIS — R0609 Other forms of dyspnea: Secondary | ICD-10-CM | POA: Diagnosis not present

## 2018-10-27 DIAGNOSIS — I10 Essential (primary) hypertension: Secondary | ICD-10-CM | POA: Diagnosis not present

## 2018-10-27 DIAGNOSIS — M6281 Muscle weakness (generalized): Secondary | ICD-10-CM | POA: Diagnosis not present

## 2018-10-27 DIAGNOSIS — Z7901 Long term (current) use of anticoagulants: Secondary | ICD-10-CM | POA: Diagnosis not present

## 2018-10-27 DIAGNOSIS — R4189 Other symptoms and signs involving cognitive functions and awareness: Secondary | ICD-10-CM | POA: Diagnosis not present

## 2018-10-27 DIAGNOSIS — N184 Chronic kidney disease, stage 4 (severe): Secondary | ICD-10-CM | POA: Diagnosis not present

## 2018-10-27 DIAGNOSIS — I5032 Chronic diastolic (congestive) heart failure: Secondary | ICD-10-CM | POA: Diagnosis not present

## 2018-10-27 DIAGNOSIS — R69 Illness, unspecified: Secondary | ICD-10-CM | POA: Diagnosis not present

## 2018-10-27 DIAGNOSIS — R5381 Other malaise: Secondary | ICD-10-CM | POA: Diagnosis not present

## 2018-10-27 DIAGNOSIS — Z79899 Other long term (current) drug therapy: Secondary | ICD-10-CM | POA: Diagnosis not present

## 2018-10-27 DIAGNOSIS — I5042 Chronic combined systolic (congestive) and diastolic (congestive) heart failure: Secondary | ICD-10-CM | POA: Diagnosis not present

## 2018-10-27 LAB — BASIC METABOLIC PANEL
Anion gap: 9 (ref 5–15)
BUN: 31 mg/dL — ABNORMAL HIGH (ref 8–23)
CO2: 20 mmol/L — ABNORMAL LOW (ref 22–32)
Calcium: 8.8 mg/dL — ABNORMAL LOW (ref 8.9–10.3)
Chloride: 109 mmol/L (ref 98–111)
Creatinine, Ser: 1.31 mg/dL — ABNORMAL HIGH (ref 0.44–1.00)
GFR calc Af Amer: 43 mL/min — ABNORMAL LOW (ref >60)
GFR calc non Af Amer: 37 mL/min — ABNORMAL LOW (ref >60)
Glucose, Bld: 144 mg/dL — ABNORMAL HIGH (ref 70–99)
Potassium: 4.4 mmol/L (ref 3.5–5.1)
Sodium: 138 mmol/L (ref 135–145)

## 2018-10-27 LAB — PROTIME-INR
INR: 2.9
Prothrombin Time: 29.9 seconds — ABNORMAL HIGH (ref 11.4–15.2)

## 2018-10-27 LAB — GLUCOSE, CAPILLARY: Glucose-Capillary: 126 mg/dL — ABNORMAL HIGH (ref 70–99)

## 2018-10-27 MED ORDER — ADULT MULTIVITAMIN W/MINERALS CH: 1.0000 | ORAL_TABLET | Freq: Every day | ORAL | Status: AC

## 2018-10-27 MED ORDER — ENSURE ENLIVE PO LIQD: 237.0000 mL | Freq: Two times a day (BID) | ORAL | Status: AC

## 2018-10-27 MED ORDER — METOPROLOL SUCCINATE ER 100 MG PO TB24: 100.0000 mg | ORAL_TABLET | Freq: Every day | ORAL | Status: AC

## 2018-10-27 MED ORDER — DILTIAZEM HCL ER COATED BEADS 120 MG PO CP24: 120.0000 mg | ORAL_CAPSULE | Freq: Every day | ORAL | Status: AC

## 2018-10-27 MED ORDER — WARFARIN 1.25 MG HALF TABLET
1.2500 mg | ORAL_TABLET | Freq: Once | ORAL | Status: AC
  Administered 2018-10-27: 1.25 mg via ORAL
  Filled 2018-10-27: qty 1

## 2018-10-27 MED ORDER — SENNOSIDES-DOCUSATE SODIUM 8.6-50 MG PO TABS: 1.0000 | ORAL_TABLET | Freq: Every evening | ORAL | Status: AC | PRN

## 2018-10-27 MED ORDER — NYSTATIN 100000 UNIT/GM EX CREA: TOPICAL_CREAM | Freq: Two times a day (BID) | CUTANEOUS | 0 refills | Status: AC

## 2018-10-27 MED ORDER — DIMETHICONE 1 % EX CREA: TOPICAL_CREAM | Freq: Two times a day (BID) | CUTANEOUS | Status: AC | PRN

## 2018-10-27 MED ORDER — ZINC OXIDE 40 % EX OINT: TOPICAL_OINTMENT | Freq: Every day | CUTANEOUS | Status: AC

## 2018-10-27 NOTE — Progress Notes (Signed)
RN removed urethral catheter per MD verbal order. Pt due to void before going to SNF. Tech states she will let RN know when pt voids.

## 2018-10-27 NOTE — Discharge Instructions (Signed)
[name] [name],  You were in the hospital because of a fall. You have no fractures. You also had fast heart rates and your medications have been adjusted. You developed sores while you were in the hospital; this will required continued wound care and prevention practices to promote healing.

## 2018-10-27 NOTE — Progress Notes (Signed)
RN attempted to call report to SNF. RN transferred to [facility], but no answer or voicemail option.

## 2018-10-27 NOTE — Progress Notes (Signed)
[place] for Coumadin Indication: h/o atrial fibrillation and history of PE (2006)  Allergies  Allergen Reactions  . Amiodarone Shortness Of Breath and Nausea Only    "Toxicity," also  . Flecainide Shortness Of Breath and Other (See Comments)    "Toxicity," also   . Morphine And Related Nausea And Vomiting and Other (See Comments)    Family requests no morphine due to past reaction. Pt stated it made her sick, severe nausea and vomiting  . Bactrim Ds [Sulfamethoxazole-Trimethoprim] Diarrhea  . Cephalexin Nausea And Vomiting  . Pineapple Itching    Severe itching    Patient Measurements: Height: 6' (182.9 cm) Weight: 226 lb 6.6 oz (102.7 kg) IBW/kg (Calculated) : 73.1  Vital Signs: Temp: 97.9 F (36.6 C) (01/28 0409) Temp Source: Oral (01/28 0409) BP: 118/62 (01/28 0818) Pulse Rate: 81 (01/28 0818)  Labs: Recent Labs    10/25/18 0526 10/26/18 0438 10/26/18 0906 10/27/18 0426  LABPROT 30.9* 28.4*  --  29.9*  INR 3.02 2.72  --  2.90  CREATININE  --   --  1.27* 1.31*    Estimated Creatinine Clearance: 42.1 mL/min (A) (by C-G formula based on SCr of 1.31 mg/dL (H)).   Medical History: Past Medical History:  Diagnosis Date  . Acute gastric ulcer without mention of hemorrhage, perforation, or obstruction 2006   EGD  . Anxiety   . Atrial fibrillation (HCC)    flecainide; coumadin  . Breast cancer ([place])    left  . Carotid artery disease ([place])    82/99: RICA 3-71%, LICA 69-67%  //  [place] 89/38: RICA 1-01%; LICA 75-10% >> FU 1 year   . Chronic diastolic heart failure (HCC)    a. echo 10/10: mild LVH, EF 55-60%, mild AI, mild MR, mild to mod LAE, mild RVE, severe RAE, mod TR, PASP 53  /  b.  Echo 3/14:  Mild LVH, EF 55%, Tr AI, MAC, mild MR, mod LAE, PASP 31, trivial eff. // c. echo 1/17: EF 55-60%, mild AI, MAC, mild MR, moderate BAE, moderate TR  . Chronic lower back pain   . Diabetic peripheral neuropathy ([place]) 03/09/2015   . Diverticulosis   . Esophagitis, unspecified 2006   EGD  . Hiatal hernia 2006   EGD  . History of nuclear stress test    a. Myoview 2/12: EF 69%, no scar or ischemia  . Hx of adenomatous colonic polyps   . Hypertension   . Mediastinal lymphadenopathy   . Memory difficulty 11/14/2016  . Mitral regurgitation    mild, echo, October, 2010  . Osteopenia   . Presence of permanent cardiac pacemaker   . Pulmonary embolus [facility]) 2006   2006, with DVT  . Pulmonary HTN ([place])    53 mmHg echo, 2010, moderate TR, mild right ventricular enlargement  . Tachy-brady syndrome ([place])    s/p pacer 07/2009  . Thyroid nodule    non-neoplastic goiter  . Type II diabetes mellitus ([place])   . Venous insufficiency    Chronic   Assessment: 85 year old female found down at home after a fall. Patient was on chronic Coumadin therapy for atrial fibrillation and history of a PE (2006). INR on admission is therapeutic at 2.61. No overt bleeding noted on admission. Head CT was negative for bleeding. Pharmacy was consulted 10/18/18 to resume Coumadin therapy.   Home regimen was 2.57m every Monday and 1.25 all other days, last dose PTA was on 10/17/18 in the  PM.   INR remains therapeutic at 2.9 (from 2.72 yesterday). Coumadin had been on hold for 2 days recently. Previous higher INR levels possibly effected by decreased meal intake. Patient now documented to be eating better and has been started on Ensure (no meal intake documented yesterday). No bleeding reported.   Goal of Therapy:  INR 2-3 Monitor platelets by anticoagulation protocol: Yes   Plan:  Coumadin 1.25 mg PO x1 tonight  Daily PT/INR  Antonietta Jewel, PharmD, BCCCP Clinical Pharmacist  Pager: [phone] Phone: [phone] 10/27/2018     9:05 AM  Please check AMION for all [place] phone numbers After 10:00 PM, call [place] [phone]

## 2018-10-27 NOTE — Plan of Care (Signed)
  Problem: Education: Goal: Knowledge of General Education information will improve Description: Including pain rating scale, medication(s)/side effects and non-pharmacologic comfort measures Outcome: Progressing   Problem: Health Behavior/Discharge Planning: Goal: Ability to manage health-related needs will improve Outcome: Progressing   Problem: Clinical Measurements: Goal: Ability to maintain clinical measurements within normal limits will improve Outcome: Progressing Goal: Will remain free from infection Outcome: Progressing Goal: Diagnostic test results will improve Outcome: Progressing Goal: Respiratory complications will improve Outcome: Progressing Goal: Cardiovascular complication will be avoided Outcome: Progressing   Problem: Activity: Goal: Risk for activity intolerance will decrease Outcome: Progressing   Problem: Nutrition: Goal: Adequate nutrition will be maintained Outcome: Progressing   Problem: Coping: Goal: Level of anxiety will decrease Outcome: Progressing   Problem: Elimination: Goal: Will not experience complications related to bowel motility Outcome: Progressing Goal: Will not experience complications related to urinary retention Outcome: Progressing   Problem: Pain Managment: Goal: General experience of comfort will improve Outcome: Progressing   Problem: Safety: Goal: Ability to remain free from injury will improve Outcome: Progressing   Problem: Skin Integrity: Goal: Risk for impaired skin integrity will decrease Outcome: Progressing   Problem: Education: Goal: Knowledge of disease or condition will improve Outcome: Progressing Goal: Understanding of medication regimen will improve Outcome: Progressing Goal: Individualized Educational Video(s) Outcome: Progressing   Problem: Activity: Goal: Ability to tolerate increased activity will improve Outcome: Progressing   Problem: Cardiac: Goal: Ability to achieve and maintain  adequate cardiopulmonary perfusion will improve Outcome: Progressing   Problem: Health Behavior/Discharge Planning: Goal: Ability to safely manage health-related needs after discharge will improve Outcome: Progressing   Problem: Education: Goal: Ability to demonstrate management of disease process will improve Outcome: Progressing Goal: Ability to verbalize understanding of medication therapies will improve Outcome: Progressing Goal: Individualized Educational Video(s) Outcome: Progressing   Problem: Activity: Goal: Capacity to carry out activities will improve Outcome: Progressing   Problem: Cardiac: Goal: Ability to achieve and maintain adequate cardiopulmonary perfusion will improve Outcome: Progressing   

## 2018-10-27 NOTE — Clinical Social Work Placement (Signed)
   CLINICAL SOCIAL WORK PLACEMENT  NOTE  Date:  10/27/2018  Patient Details  Name: [name] [name] MRN: [id number] Date of Birth: [birth year]  Clinical Social Work is seeking post-discharge placement for this patient at the   level of care (*CSW will initial, date and re-position this form in  chart as items are completed):      Patient/family provided with [place] Work Department's list of facilities offering this level of care within the geographic area requested by the patient (or if unable, by the patient's family).      Patient/family informed of their freedom to choose among providers that offer the needed level of care, that participate in Medicare, Medicaid or managed care program needed by the patient, have an available bed and are willing to accept the patient.      Patient/family informed of [facility]'s ownership interest in [facility] and [facility], as well as of the fact that they are under no obligation to receive care at these facilities.  PASRR submitted to EDS on       PASRR number received on       Existing PASRR number confirmed on       FL2 transmitted to all facilities in geographic area requested by pt/family on       FL2 transmitted to all facilities within larger geographic area on       Patient informed that his/her managed care company has contracts with or will negotiate with certain facilities, including the following:        Yes   Patient/family informed of bed offers received.  Patient [facility] bed at [facility]     Physician recommends and patient chooses bed at      Patient to be transferred to [facility] on 10/27/18.  Patient to be transferred to facility by [facility]     Patient family notified on 10/27/18 of transfer.  Name of family member notified:  Artemio Aly     PHYSICIAN Please prepare prescriptions     Additional Comment:    _______________________________________________ Candie Chroman,  LCSW 10/27/2018, 4:59 PM

## 2018-10-27 NOTE — Progress Notes (Signed)
Bladder scan= >250 mL. MD notified.

## 2018-10-27 NOTE — Plan of Care (Signed)
  Problem: Education: Goal: Knowledge of General Education information will improve Description Including pain rating scale, medication(s)/side effects and non-pharmacologic comfort measures Outcome: Progressing   Problem: Clinical Measurements: Goal: Ability to maintain clinical measurements within normal limits will improve Outcome: Progressing   Problem: Activity: Goal: Risk for activity intolerance will decrease Outcome: Progressing   Problem: Nutrition: Goal: Adequate nutrition will be maintained Outcome: Progressing   Problem: Safety: Goal: Ability to remain free from injury will improve Outcome: Progressing   Problem: Skin Integrity: Goal: Risk for impaired skin integrity will decrease Outcome: Progressing   

## 2018-10-27 NOTE — Progress Notes (Signed)
RN called report to SNF.  

## 2018-10-27 NOTE — Progress Notes (Signed)
SNF RN wants to know if pt can receive their scheduled 2200 lyrica prior to discharge d/t SNF will not have this med available tonight. RN notified MD. MD states pt should not receive med prior to discharge and that it's okay if pt doesn't receive lyrica today. RN notified SNF.

## 2018-10-27 NOTE — Discharge Summary (Signed)
Physician Discharge Summary  Eliya Bubar GXQ:[id number] DOB: 11-04-1930 DOA: 10/18/2018  PCP: Hoyt Koch, MD  Admit date: 10/18/2018 Discharge date: 10/27/2018  Admitted From: Home Disposition: SNF  Recommendations for Outpatient Follow-up:  1. Follow up with PCP in 1 week 2. Please obtain BMP/CBC in one week 3. Please follow up on the following pending results: None  Discharge Condition: Stable CODE STATUS: Full code Diet recommendation: Dysphagia 3 diet   Brief/Interim Summary:  Admission HPI written by Cristal Deer, MD   HPI: [name] [name] is a 85 y.o. female with medical history significant for atrial fibrillation on Coumadin dementia, history of breast cancer status post left mastectomy pacemaker who was brought in by family member her daughter and son due to fall.  The patient lives alone at home and she was last seen last night by her daughter and then apparently she got up to the bathroom during the night and fell this morning she was found between her bed and nightstand is unknown how long she had been there the family stated that it was difficult to move her patient denies any pain but complaining of pain and swelling in the right left eye from where she was wedged between the nightstand and the bed.  Family is complaining that she has been progressively weak over the past 3 weeks with dyspnea on exertion they feel like she might need rehabilitation and advised her that since she lives alone with increased dementia the rehab would not really do anything they might need to consider assisted living or nursing home placement in any case  physical therapy will be ordered for evaluation.  Also the complaint of increased confusion especially lately and that whenever she falls that she usually has a urinary tract infection, family is also concerned about oxygen being low with shortness of breath and wanted to know if she would go home on oxygen they were advised  that her oxygen is between 88 and 100 off-and-on and that ABG will be ordered and if she meets criteria she will be able to go home with oxygen patient lives alone but her family comes over to give her her medication she was found to be severely dehydrated in the ED and received 2 boluses of normal saline total of the first 1 was 500 in the ED and the second was 250 on the floor because she continued to be tachycardic    Hospital course:  Fall CT head without acute abnormalities. C-spine without fracture. PT recommending SNF.  Chronic combined systolic and diastolic heart failure Was on Lasix per cardiology. Transthoracic Echocardiogram significant for an EF of 45%. Continue Lasix on discharge. Recheck BMP as an outpatient.  Acute kidney injury on CKD stage III Baseline creatinine of 2.52. Peak creatinine of 1.7. Improved with diuresis, down to 1.7.  Chronic atrial fibrillation with RVR Intermittent tachycardia but mostly controlled. Patient on Cardizem and metoprolol.   Rhabdomyolsis Mild.  Thyroid nodule Family does not want further investigation.  Dementia Stable. Continue Aricept  Urinary tract infection Urinalysis suggested infection. Urine culture with no growth. Patient received two days of ceftriaxone before discontinuation.  Candidal rash Treatment with Nystatin  Pressure injury Left buttock, right thigh, left labia. Not present on arrival. Continue wound care and pressure offloading.  Debility Patient requiring max assist for transfers. Cannot be managed by family at home as her daughter is her main available family. Patient was previously functional using her Rollator at home.  Discharge Diagnoses:  Principal  Problem:   Syncope Active Problems:   Acute lower UTI   History of pulmonary embolism   History of breast cancer   Pacemaker-[employer]   Goals of care, counseling/discussion   Chronic diastolic (congestive) heart failure (HCC)   Dyspnea on  exertion   Diabetes mellitus type 2, noninsulin dependent (HCC)   A-fib (HCC)   Cognitive change   CKD (chronic kidney disease), stage IV (HCC)   Solitary pulmonary nodule on lung CT   Fall at home, initial encounter   Pulmonary embolism on long-term anticoagulation therapy ([place])   Syncope and collapse   Palliative care by specialist   Pressure injury of skin    Discharge Instructions  Discharge Instructions    Call MD for:  redness, tenderness, or signs of infection (pain, swelling, redness, odor or green/yellow discharge around incision site)   Complete by:  As directed    Call MD for:  temperature >100.4   Complete by:  As directed    Increase activity slowly   Complete by:  As directed      Allergies as of 10/27/2018      Reactions   Amiodarone Shortness Of Breath, Nausea Only   "Toxicity," also   Flecainide Shortness Of Breath, Other (See Comments)   "Toxicity," also   Morphine And Related Nausea And Vomiting, Other (See Comments)   Family requests no morphine due to past reaction. Pt stated it made her sick, severe nausea and vomiting   Bactrim Ds [sulfamethoxazole-trimethoprim] Diarrhea   Cephalexin Nausea And Vomiting   Pineapple Itching   Severe itching      Medication List    STOP taking these medications   triamcinolone cream 0.1 % Commonly known as:  KENALOG     TAKE these medications   acetaminophen 325 MG tablet Commonly known as:  TYLENOL Take 650 mg by mouth every 6 (six) hours as needed (back pain).   bethanechol 25 MG tablet Commonly known as:  URECHOLINE Take 25 mg by mouth 2 (two) times daily.   blood glucose meter kit and supplies Kit Dispense based on patient and insurance preference. Use before breakfast and at bedtime. Diagnosis code: E11.9   diltiazem 120 MG 24 hr capsule Commonly known as:  CARDIZEM CD Take 1 capsule (120 mg total) by mouth daily. Start taking on:  October 28, 2018   dimethicone 1 % cream Apply topically 2  (two) times daily as needed for dry skin.   feeding supplement (ENSURE ENLIVE) Liqd Take 237 mLs by mouth 2 (two) times daily between meals. Start taking on:  October 28, 2018   furosemide 80 MG tablet Commonly known as:  LASIX TAKE 1 TABLET (80 MG TOTAL) BY MOUTH 2 (TWO) TIMES DAILY.   liver oil-zinc oxide 40 % ointment Commonly known as:  DESITIN Apply topically 6 (six) times daily. Apply to buttocks, labia, inner upper thighs, upper posterior thighs   memantine 5 MG tablet Commonly known as:  NAMENDA Take 1 tablet (5 mg total) by mouth 2 (two) times daily.   metoprolol succinate 100 MG 24 hr tablet Commonly known as:  TOPROL-XL Take 1 tablet (100 mg total) by mouth daily. Take with or immediately following a meal. Start taking on:  October 28, 2018 What changed:    medication strength  how much to take  additional instructions   multivitamin with minerals Tabs tablet Take 1 tablet by mouth daily. Start taking on:  October 28, 2018   nystatin cream Commonly known as:  MYCOSTATIN Apply topically 2 (two) times daily. Apply to bilateral groins   potassium chloride 20 MEQ/15ML (10%) Soln Take 20 mEq by mouth daily as needed (if have not eaten a banana).   pregabalin 75 MG capsule Commonly known as:  LYRICA Take 1 capsule (75 mg total) by mouth at bedtime.   promethazine 25 MG tablet Commonly known as:  PHENERGAN Take 1 tablet (25 mg total) by mouth every 8 (eight) hours as needed for nausea or vomiting.   senna-docusate 8.6-50 MG tablet Commonly known as:  Senokot-S Take 1 tablet by mouth at bedtime as needed for mild constipation.   vitamin B-12 500 MCG tablet Commonly known as:  CYANOCOBALAMIN Take 500 mcg by mouth daily.   warfarin 2.5 MG tablet Commonly known as:  COUMADIN Take as directed. If you are unsure how to take this medication, talk to your nurse or doctor. Original instructions:  TAKE 1/2 TABLET TO 1 TABLET DAILY OR AS DIRECTED BY COUMADIN  CLINIC What changed:  See the new instructions.      Contact information for after-discharge care    Destination    [facility] Preferred SNF .   Service:  Skilled Nursing Contact information: [place] [phone]             Allergies  Allergen Reactions  . Amiodarone Shortness Of Breath and Nausea Only    "Toxicity," also  . Flecainide Shortness Of Breath and Other (See Comments)    "Toxicity," also   . Morphine And Related Nausea And Vomiting and Other (See Comments)    Family requests no morphine due to past reaction. Pt stated it made her sick, severe nausea and vomiting  . Bactrim Ds [Sulfamethoxazole-Trimethoprim] Diarrhea  . Cephalexin Nausea And Vomiting  . Pineapple Itching    Severe itching    Consultations:  Cardiology   Procedures/Studies: Dg Chest 1 View  Result Date: 10/18/2018 CLINICAL DATA:  Dyspnea EXAM: CHEST  1 VIEW COMPARISON:  05/20/2018 FINDINGS: Diffuse bilateral mild interstitial thickening. No pleural effusion or pneumothorax. Stable cardiomegaly. Dual lead cardiac pacemaker. No acute osseous abnormality. Small hiatal hernia. Surgical clips in the left axilla. IMPRESSION: Findings concerning for mild CHF. Electronically Signed   By: Kathreen Devoid   On: 10/18/2018 16:54   Ct Head Wo Contrast  Result Date: 10/18/2018 CLINICAL DATA:  Fall EXAM: CT HEAD WITHOUT CONTRAST CT CERVICAL SPINE WITHOUT CONTRAST TECHNIQUE: Multidetector CT imaging of the head and cervical spine was performed following the standard protocol without intravenous contrast. Multiplanar CT image reconstructions of the cervical spine were also generated. COMPARISON:  CT head 05/11/2018 FINDINGS: CT HEAD FINDINGS Brain: Moderate atrophy and moderate to severe chronic microvascular ischemic change in the white matter. Negative for acute infarct, hemorrhage, mass. No midline shift. Vascular: Atherosclerotic calcification. Negative for  hyperdense vessel Skull: Negative for skull fracture Sinuses/Orbits: Negative Other: None CT CERVICAL SPINE FINDINGS Alignment: Normal Skull base and vertebrae: Negative for fracture Soft tissues and spinal canal: 11 x 16 mm posterior right thyroid nodule. Additional right anterior thyroid nodule approximately 10 mm. No adenopathy. Atherosclerotic calcification aortic arch and carotid arteries. Disc levels: Moderate to advanced disc degeneration and spondylosis C3 through C7 causing spinal and foraminal stenosis. Upper chest: Apical pleuroparenchymal scarring bilaterally. No acute abnormality Other: None IMPRESSION: 1. No acute intracranial abnormality. Moderate atrophy and moderate to severe chronic microvascular ischemia 2. Advanced cervical spondylosis.  Negative for cervical fracture 3. Right thyroid nodules. Electronically Signed  By: Franchot Gallo M.D.   On: 10/18/2018 10:47   Ct Cervical Spine Wo Contrast  Result Date: 10/18/2018 CLINICAL DATA:  Fall EXAM: CT HEAD WITHOUT CONTRAST CT CERVICAL SPINE WITHOUT CONTRAST TECHNIQUE: Multidetector CT imaging of the head and cervical spine was performed following the standard protocol without intravenous contrast. Multiplanar CT image reconstructions of the cervical spine were also generated. COMPARISON:  CT head 05/11/2018 FINDINGS: CT HEAD FINDINGS Brain: Moderate atrophy and moderate to severe chronic microvascular ischemic change in the white matter. Negative for acute infarct, hemorrhage, mass. No midline shift. Vascular: Atherosclerotic calcification. Negative for hyperdense vessel Skull: Negative for skull fracture Sinuses/Orbits: Negative Other: None CT CERVICAL SPINE FINDINGS Alignment: Normal Skull base and vertebrae: Negative for fracture Soft tissues and spinal canal: 11 x 16 mm posterior right thyroid nodule. Additional right anterior thyroid nodule approximately 10 mm. No adenopathy. Atherosclerotic calcification aortic arch and carotid arteries.  Disc levels: Moderate to advanced disc degeneration and spondylosis C3 through C7 causing spinal and foraminal stenosis. Upper chest: Apical pleuroparenchymal scarring bilaterally. No acute abnormality Other: None IMPRESSION: 1. No acute intracranial abnormality. Moderate atrophy and moderate to severe chronic microvascular ischemia 2. Advanced cervical spondylosis.  Negative for cervical fracture 3. Right thyroid nodules. Electronically Signed   By: Franchot Gallo M.D.   On: 10/18/2018 10:47   Dg Chest Port 1 View  Result Date: 10/18/2018 CLINICAL DATA:  Status post fall.  No chest pain EXAM: PORTABLE CHEST 1 VIEW COMPARISON:  05/20/2018 FINDINGS: Bilateral diffuse interstitial thickening. No pleural effusion or pneumothorax. Stable cardiomegaly. Dual lead cardiac pacemaker. No acute osseous abnormality. IMPRESSION: Cardiomegaly with mild pulmonary vascular congestion. Electronically Signed   By: Kathreen Devoid   On: 10/18/2018 12:59     Subjective: No concerns today.  Discharge Exam: Vitals:   10/27/18 0409 10/27/18 0818  BP: (!) 113/57 118/62  Pulse: 82 81  Resp: 18   Temp: 97.9 F (36.6 C)   SpO2: 94%    Vitals:   10/26/18 2108 10/27/18 0409 10/27/18 0415 10/27/18 0818  BP: 113/70 (!) 113/57  118/62  Pulse: 93 82  81  Resp: 18 18    Temp: 98.6 F (37 C) 97.9 F (36.6 C)    TempSrc: Oral Oral    SpO2: 95% 94%    Weight:   102.7 kg   Height:   6' (1.829 m)     General: Pt is alert, awake, not in acute distress Cardiovascular: Irregular rhythm with normal rate, S1/S2 +, no rubs, no gallops Respiratory: CTA bilaterally, no wheezing, no rhonchi Abdominal: Soft, NT, ND, bowel sounds + Extremities: no edema, no cyanosis    The results of significant diagnostics from this hospitalization (including imaging, microbiology, ancillary and laboratory) are listed below for reference.     Microbiology: Recent Results (from the past 240 hour(s))  Urine culture     Status: None    Collection Time: 10/18/18 11:48 AM  Result Value Ref Range Status   Specimen Description URINE, RANDOM  Final   Special Requests   Final    NONE Performed at [facility], [address] [place], [place] [address]    Culture NO GROWTH  Final   Report Status 10/19/2018 FINAL  Final     Labs: BNP (last 3 results) Recent Labs    05/11/18 1238 05/20/18 1504  BNP 581.6* 010.9*   Basic Metabolic Panel: Recent Labs  Lab 10/22/18 0500 10/23/18 3235 10/24/18 0421 10/26/18 5732 10/27/18 2025  NA 134* 134* 136 137 138  K 4.2 4.3 4.5 4.3 4.4  CL 104 106 104 110 109  CO2 19* 18* 20* 19* 20*  GLUCOSE 159* 123* 134* 133* 144*  BUN 44* 47* 44* 30* 31*  CREATININE 2.14* 1.99* 1.70* 1.27* 1.31*  CALCIUM 9.1 8.8* 8.7* 8.8* 8.8*   Liver Function Tests: Recent Labs  Lab 10/23/18 0651  ALBUMIN 1.6*   No results for input(s): LIPASE, AMYLASE in the last 168 hours. No results for input(s): AMMONIA in the last 168 hours. CBC: Recent Labs  Lab 10/21/18 0441  WBC 10.2  NEUTROABS 8.3*  HGB 12.2  HCT 39.0  MCV 85.5  PLT 331   Cardiac Enzymes: No results for input(s): CKTOTAL, CKMB, CKMBINDEX, TROPONINI in the last 168 hours. BNP: Invalid input(s): POCBNP CBG: Recent Labs  Lab 10/21/18 0731 10/22/18 0736 10/23/18 0837 10/24/18 0740 10/27/18 0540  GLUCAP 142* 144* 118* 111* 126*   D-Dimer No results for input(s): DDIMER in the last 72 hours. Hgb A1c No results for input(s): HGBA1C in the last 72 hours. Lipid Profile No results for input(s): CHOL, HDL, LDLCALC, TRIG, CHOLHDL, LDLDIRECT in the last 72 hours. Thyroid function studies No results for input(s): TSH, T4TOTAL, T3FREE, THYROIDAB in the last 72 hours.  Invalid input(s): FREET3 Anemia work up No results for input(s): VITAMINB12, FOLATE, FERRITIN, TIBC, IRON, RETICCTPCT in the last 72 hours. Urinalysis    Component Value Date/Time   COLORURINE AMBER (A) 10/18/2018 1148   APPEARANCEUR CLOUDY (A)  10/18/2018 1148   APPEARANCEUR Clear 01/06/2017 1601   LABSPEC 1.011 10/18/2018 1148   LABSPEC 1.008 11/30/2011 0748   PHURINE 5.0 10/18/2018 1148   GLUCOSEU NEGATIVE 10/18/2018 1148   GLUCOSEU NEGATIVE 09/03/2018 1041   HGBUR LARGE (A) 10/18/2018 1148   BILIRUBINUR NEGATIVE 10/18/2018 1148   BILIRUBINUR Neg 05/08/2018 1611   BILIRUBINUR Negative 01/06/2017 1601   BILIRUBINUR Negative 11/30/2011 0748   KETONESUR NEGATIVE 10/18/2018 1148   PROTEINUR 30 (A) 10/18/2018 1148   UROBILINOGEN 1.0 09/03/2018 1041   NITRITE NEGATIVE 10/18/2018 1148   LEUKOCYTESUR LARGE (A) 10/18/2018 1148   LEUKOCYTESUR Negative 01/06/2017 1601   LEUKOCYTESUR 3+ 11/30/2011 0748   Sepsis Labs Invalid input(s): PROCALCITONIN,  WBC,  LACTICIDVEN Microbiology Recent Results (from the past 240 hour(s))  Urine culture     Status: None   Collection Time: 10/18/18 11:48 AM  Result Value Ref Range Status   Specimen Description URINE, RANDOM  Final   Special Requests   Final    NONE Performed at [facility], [place] [address], [place], [place] [address]    Culture NO GROWTH  Final   Report Status 10/19/2018 FINAL  Final    SIGNED:   Cordelia Poche, MD [facility] 10/27/2018, 4:51 PM

## 2018-10-27 NOTE — Consult Note (Signed)
[place] Nurse wound follow up Wound type: DTPIs, Unable to assess between legs for labial wounds today Measurement: Left ischium: partial thickness skin loss 3cm x 2cm x 0.1cm centrally at area of DTPI previously Right ischium: partial thickness skin loss 3cm x 3cm x 0.1cm centrally at area of DTPI previously Right ischium medial: partial thickness skin loss 0.5cm x 2.5cm x 0.1cm centrally at area of DTPI previously Wound bed: Drainage (amount, consistency, odor) scant, no odor Periwound: intact Dressing procedure/placement/frequency: Continue silicone foam to protect areas Manage incontinence   [facility]OC Nurse team will follow with you and see patient within 10 days for wound assessments.  Please notify [facility] nurses of any acute changes in the wounds or any new areas of concern [place] MSN, [place], [place], [place]

## 2018-10-27 NOTE — Clinical Social Work Note (Signed)
CSW facilitated patient discharge including contacting patient family and facility to confirm patient discharge plans. Clinical information faxed to facility and family agreeable with plan. CSW arranged ambulance transport via [facility] to [facility]. RN to call report prior to discharge [phone] Ask for [facility] Room 1203P).  CSW will sign off for now as social work intervention is no longer needed. Please consult [place] again if new needs arise.  [place] Scrape, [place]

## 2018-10-28 ENCOUNTER — Other Ambulatory Visit: Payer: Self-pay

## 2018-10-28 ENCOUNTER — Telehealth: Payer: Self-pay | Admitting: *Deleted

## 2018-10-28 DIAGNOSIS — E119 Type 2 diabetes mellitus without complications: Secondary | ICD-10-CM | POA: Diagnosis not present

## 2018-10-28 DIAGNOSIS — R69 Illness, unspecified: Secondary | ICD-10-CM | POA: Diagnosis not present

## 2018-10-28 DIAGNOSIS — I5042 Chronic combined systolic (congestive) and diastolic (congestive) heart failure: Secondary | ICD-10-CM | POA: Diagnosis not present

## 2018-10-28 DIAGNOSIS — I480 Paroxysmal atrial fibrillation: Secondary | ICD-10-CM | POA: Diagnosis not present

## 2018-10-28 NOTE — Telephone Encounter (Signed)
[name] [name] was on TCM report admitted 10/18/18 due to fall.[name] [name] was found to be severely dehydrated in the ED and received 2 boluses of normal saline total of the first 1 was 500 in the ED and the second was 250 on the floor because she continued to be tachycardic. [name] [name] had CT head which results showed without acute abnormalities. C-spine without fracture. [name] [name] recommending SNF [name] [name] was d/c 10/27/18 to [facility] Per summary will need to f/u w/PCP 1 week after leaving SNF.[name] KitchenJohny Chess

## 2018-10-29 ENCOUNTER — Telehealth: Payer: Self-pay | Admitting: *Deleted

## 2018-10-29 DIAGNOSIS — N178 Other acute kidney failure: Secondary | ICD-10-CM | POA: Diagnosis not present

## 2018-10-29 DIAGNOSIS — I480 Paroxysmal atrial fibrillation: Secondary | ICD-10-CM | POA: Diagnosis not present

## 2018-10-29 DIAGNOSIS — R2689 Other abnormalities of gait and mobility: Secondary | ICD-10-CM | POA: Diagnosis not present

## 2018-10-29 DIAGNOSIS — R4189 Other symptoms and signs involving cognitive functions and awareness: Secondary | ICD-10-CM | POA: Diagnosis not present

## 2018-10-29 NOTE — Telephone Encounter (Signed)
Daughter [name] called and stated the pt has been discharged from [facility] and now in in [facility]. She states that the pt has bedsores and here memory has declined.  While at [facility], they will manage the INR while she is there. The dtr states she will be there for a little while and instructed her to call back once the pt is discharged from the facility.

## 2018-10-31 DIAGNOSIS — I130 Hypertensive heart and chronic kidney disease with heart failure and stage 1 through stage 4 chronic kidney disease, or unspecified chronic kidney disease: Secondary | ICD-10-CM | POA: Diagnosis present

## 2018-10-31 DIAGNOSIS — N183 Chronic kidney disease, stage 3 (moderate): Secondary | ICD-10-CM | POA: Diagnosis not present

## 2018-10-31 DIAGNOSIS — K219 Gastro-esophageal reflux disease without esophagitis: Secondary | ICD-10-CM | POA: Diagnosis present

## 2018-10-31 DIAGNOSIS — R0902 Hypoxemia: Secondary | ICD-10-CM | POA: Diagnosis not present

## 2018-10-31 DIAGNOSIS — F0390 Unspecified dementia without behavioral disturbance: Secondary | ICD-10-CM | POA: Diagnosis present

## 2018-10-31 DIAGNOSIS — M6281 Muscle weakness (generalized): Secondary | ICD-10-CM | POA: Diagnosis not present

## 2018-10-31 DIAGNOSIS — Z7409 Other reduced mobility: Secondary | ICD-10-CM | POA: Diagnosis not present

## 2018-10-31 DIAGNOSIS — R627 Adult failure to thrive: Secondary | ICD-10-CM | POA: Diagnosis present

## 2018-10-31 DIAGNOSIS — R600 Localized edema: Secondary | ICD-10-CM | POA: Diagnosis not present

## 2018-10-31 DIAGNOSIS — Z9981 Dependence on supplemental oxygen: Secondary | ICD-10-CM | POA: Diagnosis not present

## 2018-10-31 DIAGNOSIS — I4821 Permanent atrial fibrillation: Secondary | ICD-10-CM | POA: Diagnosis present

## 2018-10-31 DIAGNOSIS — Z7901 Long term (current) use of anticoagulants: Secondary | ICD-10-CM | POA: Diagnosis not present

## 2018-10-31 DIAGNOSIS — R5381 Other malaise: Secondary | ICD-10-CM | POA: Diagnosis not present

## 2018-10-31 DIAGNOSIS — I10 Essential (primary) hypertension: Secondary | ICD-10-CM | POA: Diagnosis not present

## 2018-10-31 DIAGNOSIS — M25531 Pain in right wrist: Secondary | ICD-10-CM | POA: Diagnosis not present

## 2018-10-31 DIAGNOSIS — K208 Other esophagitis: Secondary | ICD-10-CM | POA: Diagnosis not present

## 2018-10-31 DIAGNOSIS — I272 Pulmonary hypertension, unspecified: Secondary | ICD-10-CM | POA: Diagnosis present

## 2018-10-31 DIAGNOSIS — Z87891 Personal history of nicotine dependence: Secondary | ICD-10-CM | POA: Diagnosis not present

## 2018-10-31 DIAGNOSIS — E1142 Type 2 diabetes mellitus with diabetic polyneuropathy: Secondary | ICD-10-CM | POA: Diagnosis not present

## 2018-10-31 DIAGNOSIS — S63501A Unspecified sprain of right wrist, initial encounter: Secondary | ICD-10-CM | POA: Diagnosis not present

## 2018-10-31 DIAGNOSIS — G8929 Other chronic pain: Secondary | ICD-10-CM | POA: Diagnosis present

## 2018-10-31 DIAGNOSIS — Z79899 Other long term (current) drug therapy: Secondary | ICD-10-CM | POA: Diagnosis not present

## 2018-10-31 DIAGNOSIS — R41841 Cognitive communication deficit: Secondary | ICD-10-CM | POA: Diagnosis not present

## 2018-10-31 DIAGNOSIS — M6282 Rhabdomyolysis: Secondary | ICD-10-CM | POA: Diagnosis not present

## 2018-10-31 DIAGNOSIS — R000 Tachycardia, unspecified: Secondary | ICD-10-CM | POA: Diagnosis not present

## 2018-10-31 DIAGNOSIS — I5032 Chronic diastolic (congestive) heart failure: Secondary | ICD-10-CM | POA: Diagnosis not present

## 2018-10-31 DIAGNOSIS — N178 Other acute kidney failure: Secondary | ICD-10-CM | POA: Diagnosis not present

## 2018-10-31 DIAGNOSIS — N179 Acute kidney failure, unspecified: Secondary | ICD-10-CM | POA: Diagnosis not present

## 2018-10-31 DIAGNOSIS — E1122 Type 2 diabetes mellitus with diabetic chronic kidney disease: Secondary | ICD-10-CM | POA: Diagnosis present

## 2018-10-31 DIAGNOSIS — Z853 Personal history of malignant neoplasm of breast: Secondary | ICD-10-CM | POA: Diagnosis not present

## 2018-10-31 DIAGNOSIS — M545 Low back pain: Secondary | ICD-10-CM | POA: Diagnosis present

## 2018-10-31 DIAGNOSIS — Z950 Presence of cardiac pacemaker: Secondary | ICD-10-CM | POA: Diagnosis not present

## 2018-10-31 DIAGNOSIS — R404 Transient alteration of awareness: Secondary | ICD-10-CM | POA: Diagnosis not present

## 2018-10-31 DIAGNOSIS — R1312 Dysphagia, oropharyngeal phase: Secondary | ICD-10-CM | POA: Diagnosis not present

## 2018-10-31 DIAGNOSIS — Z90710 Acquired absence of both cervix and uterus: Secondary | ICD-10-CM | POA: Diagnosis not present

## 2018-10-31 DIAGNOSIS — N184 Chronic kidney disease, stage 4 (severe): Secondary | ICD-10-CM | POA: Diagnosis not present

## 2018-10-31 DIAGNOSIS — N390 Urinary tract infection, site not specified: Secondary | ICD-10-CM | POA: Diagnosis not present

## 2018-10-31 DIAGNOSIS — I480 Paroxysmal atrial fibrillation: Secondary | ICD-10-CM | POA: Diagnosis not present

## 2018-10-31 DIAGNOSIS — I4891 Unspecified atrial fibrillation: Secondary | ICD-10-CM | POA: Diagnosis not present

## 2018-10-31 DIAGNOSIS — R531 Weakness: Secondary | ICD-10-CM | POA: Diagnosis not present

## 2018-10-31 DIAGNOSIS — Z86711 Personal history of pulmonary embolism: Secondary | ICD-10-CM | POA: Diagnosis not present

## 2018-10-31 DIAGNOSIS — E119 Type 2 diabetes mellitus without complications: Secondary | ICD-10-CM | POA: Diagnosis not present

## 2018-10-31 DIAGNOSIS — E876 Hypokalemia: Secondary | ICD-10-CM | POA: Diagnosis not present

## 2018-10-31 DIAGNOSIS — I5023 Acute on chronic systolic (congestive) heart failure: Secondary | ICD-10-CM | POA: Diagnosis not present

## 2018-10-31 DIAGNOSIS — Z9010 Acquired absence of unspecified breast and nipple: Secondary | ICD-10-CM | POA: Diagnosis not present

## 2018-10-31 DIAGNOSIS — Z5181 Encounter for therapeutic drug level monitoring: Secondary | ICD-10-CM | POA: Diagnosis not present

## 2018-10-31 DIAGNOSIS — R791 Abnormal coagulation profile: Secondary | ICD-10-CM | POA: Diagnosis not present

## 2018-10-31 DIAGNOSIS — I5042 Chronic combined systolic (congestive) and diastolic (congestive) heart failure: Secondary | ICD-10-CM | POA: Diagnosis not present

## 2018-10-31 DIAGNOSIS — R278 Other lack of coordination: Secondary | ICD-10-CM | POA: Diagnosis not present

## 2018-10-31 DIAGNOSIS — I5041 Acute combined systolic (congestive) and diastolic (congestive) heart failure: Secondary | ICD-10-CM | POA: Diagnosis not present

## 2018-11-03 DIAGNOSIS — E119 Type 2 diabetes mellitus without complications: Secondary | ICD-10-CM | POA: Diagnosis not present

## 2018-11-03 DIAGNOSIS — N184 Chronic kidney disease, stage 4 (severe): Secondary | ICD-10-CM | POA: Diagnosis not present

## 2018-11-03 DIAGNOSIS — R791 Abnormal coagulation profile: Secondary | ICD-10-CM | POA: Diagnosis not present

## 2018-11-03 DIAGNOSIS — I5042 Chronic combined systolic (congestive) and diastolic (congestive) heart failure: Secondary | ICD-10-CM | POA: Diagnosis not present

## 2018-11-04 DIAGNOSIS — I5042 Chronic combined systolic (congestive) and diastolic (congestive) heart failure: Secondary | ICD-10-CM | POA: Diagnosis not present

## 2018-11-04 DIAGNOSIS — R791 Abnormal coagulation profile: Secondary | ICD-10-CM | POA: Diagnosis not present

## 2018-11-05 ENCOUNTER — Other Ambulatory Visit: Payer: Self-pay | Admitting: Internal Medicine

## 2018-11-05 DIAGNOSIS — I5042 Chronic combined systolic (congestive) and diastolic (congestive) heart failure: Secondary | ICD-10-CM | POA: Diagnosis not present

## 2018-11-05 DIAGNOSIS — R791 Abnormal coagulation profile: Secondary | ICD-10-CM | POA: Diagnosis not present

## 2018-11-05 DIAGNOSIS — R5381 Other malaise: Secondary | ICD-10-CM | POA: Diagnosis not present

## 2018-11-05 DIAGNOSIS — N184 Chronic kidney disease, stage 4 (severe): Secondary | ICD-10-CM | POA: Diagnosis not present

## 2018-11-05 DIAGNOSIS — I480 Paroxysmal atrial fibrillation: Secondary | ICD-10-CM | POA: Diagnosis not present

## 2018-11-09 DIAGNOSIS — M25531 Pain in right wrist: Secondary | ICD-10-CM | POA: Diagnosis not present

## 2018-11-10 DIAGNOSIS — M25531 Pain in right wrist: Secondary | ICD-10-CM | POA: Diagnosis not present

## 2018-11-10 DIAGNOSIS — I5042 Chronic combined systolic (congestive) and diastolic (congestive) heart failure: Secondary | ICD-10-CM | POA: Diagnosis not present

## 2018-11-10 DIAGNOSIS — N178 Other acute kidney failure: Secondary | ICD-10-CM | POA: Diagnosis not present

## 2018-11-10 DIAGNOSIS — N184 Chronic kidney disease, stage 4 (severe): Secondary | ICD-10-CM | POA: Diagnosis not present

## 2018-11-10 DIAGNOSIS — I480 Paroxysmal atrial fibrillation: Secondary | ICD-10-CM | POA: Diagnosis not present

## 2018-11-11 DIAGNOSIS — I480 Paroxysmal atrial fibrillation: Secondary | ICD-10-CM | POA: Diagnosis not present

## 2018-11-11 DIAGNOSIS — S63501A Unspecified sprain of right wrist, initial encounter: Secondary | ICD-10-CM | POA: Diagnosis not present

## 2018-11-17 DIAGNOSIS — Z5181 Encounter for therapeutic drug level monitoring: Secondary | ICD-10-CM | POA: Diagnosis not present

## 2018-11-17 DIAGNOSIS — I5042 Chronic combined systolic (congestive) and diastolic (congestive) heart failure: Secondary | ICD-10-CM | POA: Diagnosis not present

## 2018-11-17 DIAGNOSIS — N184 Chronic kidney disease, stage 4 (severe): Secondary | ICD-10-CM | POA: Diagnosis not present

## 2018-11-17 DIAGNOSIS — M25531 Pain in right wrist: Secondary | ICD-10-CM | POA: Diagnosis not present

## 2018-11-17 DIAGNOSIS — E876 Hypokalemia: Secondary | ICD-10-CM | POA: Diagnosis not present

## 2018-11-17 DIAGNOSIS — Z7901 Long term (current) use of anticoagulants: Secondary | ICD-10-CM | POA: Diagnosis not present

## 2018-11-20 DIAGNOSIS — E119 Type 2 diabetes mellitus without complications: Secondary | ICD-10-CM | POA: Diagnosis not present

## 2018-11-20 DIAGNOSIS — K208 Other esophagitis: Secondary | ICD-10-CM | POA: Diagnosis not present

## 2018-11-20 DIAGNOSIS — I5041 Acute combined systolic (congestive) and diastolic (congestive) heart failure: Secondary | ICD-10-CM | POA: Diagnosis not present

## 2018-11-20 DIAGNOSIS — I480 Paroxysmal atrial fibrillation: Secondary | ICD-10-CM | POA: Diagnosis not present

## 2018-11-20 DIAGNOSIS — E876 Hypokalemia: Secondary | ICD-10-CM | POA: Diagnosis not present

## 2018-11-23 ENCOUNTER — Encounter (HOSPITAL_COMMUNITY): Payer: Self-pay

## 2018-11-23 ENCOUNTER — Other Ambulatory Visit: Payer: Self-pay

## 2018-11-23 ENCOUNTER — Inpatient Hospital Stay (HOSPITAL_COMMUNITY)
Admission: EM | Admit: 2018-11-23 | Discharge: 2018-11-25 | DRG: 683 | Disposition: A | Discharge status: Skilled Nursing Facility | Payer: Medicare HMO | Source: Skilled Nursing Facility | Attending: Internal Medicine | Admitting: Internal Medicine

## 2018-11-23 DIAGNOSIS — I959 Hypotension, unspecified: Secondary | ICD-10-CM | POA: Diagnosis present

## 2018-11-23 DIAGNOSIS — R627 Adult failure to thrive: Secondary | ICD-10-CM

## 2018-11-23 DIAGNOSIS — Z9981 Dependence on supplemental oxygen: Secondary | ICD-10-CM

## 2018-11-23 DIAGNOSIS — N184 Chronic kidney disease, stage 4 (severe): Secondary | ICD-10-CM | POA: Diagnosis present

## 2018-11-23 DIAGNOSIS — Z90710 Acquired absence of both cervix and uterus: Secondary | ICD-10-CM

## 2018-11-23 DIAGNOSIS — Z7901 Long term (current) use of anticoagulants: Secondary | ICD-10-CM

## 2018-11-23 DIAGNOSIS — E119 Type 2 diabetes mellitus without complications: Secondary | ICD-10-CM

## 2018-11-23 DIAGNOSIS — I5041 Acute combined systolic (congestive) and diastolic (congestive) heart failure: Secondary | ICD-10-CM | POA: Diagnosis not present

## 2018-11-23 DIAGNOSIS — Z87891 Personal history of nicotine dependence: Secondary | ICD-10-CM

## 2018-11-23 DIAGNOSIS — Z86711 Personal history of pulmonary embolism: Secondary | ICD-10-CM | POA: Diagnosis present

## 2018-11-23 DIAGNOSIS — Z9010 Acquired absence of unspecified breast and nipple: Secondary | ICD-10-CM

## 2018-11-23 DIAGNOSIS — G8929 Other chronic pain: Secondary | ICD-10-CM | POA: Diagnosis present

## 2018-11-23 DIAGNOSIS — E1142 Type 2 diabetes mellitus with diabetic polyneuropathy: Secondary | ICD-10-CM | POA: Diagnosis present

## 2018-11-23 DIAGNOSIS — N179 Acute kidney failure, unspecified: Principal | ICD-10-CM | POA: Diagnosis present

## 2018-11-23 DIAGNOSIS — K219 Gastro-esophageal reflux disease without esophagitis: Secondary | ICD-10-CM | POA: Diagnosis present

## 2018-11-23 DIAGNOSIS — I10 Essential (primary) hypertension: Secondary | ICD-10-CM | POA: Diagnosis present

## 2018-11-23 DIAGNOSIS — E876 Hypokalemia: Secondary | ICD-10-CM | POA: Diagnosis present

## 2018-11-23 DIAGNOSIS — N183 Chronic kidney disease, stage 3 unspecified: Secondary | ICD-10-CM

## 2018-11-23 DIAGNOSIS — I4891 Unspecified atrial fibrillation: Secondary | ICD-10-CM | POA: Diagnosis present

## 2018-11-23 DIAGNOSIS — I130 Hypertensive heart and chronic kidney disease with heart failure and stage 1 through stage 4 chronic kidney disease, or unspecified chronic kidney disease: Secondary | ICD-10-CM | POA: Diagnosis present

## 2018-11-23 DIAGNOSIS — I340 Nonrheumatic mitral (valve) insufficiency: Secondary | ICD-10-CM | POA: Diagnosis present

## 2018-11-23 DIAGNOSIS — R531 Weakness: Secondary | ICD-10-CM

## 2018-11-23 DIAGNOSIS — N390 Urinary tract infection, site not specified: Secondary | ICD-10-CM | POA: Diagnosis present

## 2018-11-23 DIAGNOSIS — I480 Paroxysmal atrial fibrillation: Secondary | ICD-10-CM | POA: Diagnosis not present

## 2018-11-23 DIAGNOSIS — Z853 Personal history of malignant neoplasm of breast: Secondary | ICD-10-CM

## 2018-11-23 DIAGNOSIS — F0390 Unspecified dementia without behavioral disturbance: Secondary | ICD-10-CM

## 2018-11-23 DIAGNOSIS — M545 Low back pain: Secondary | ICD-10-CM | POA: Diagnosis present

## 2018-11-23 DIAGNOSIS — I4821 Permanent atrial fibrillation: Secondary | ICD-10-CM | POA: Diagnosis present

## 2018-11-23 DIAGNOSIS — I5032 Chronic diastolic (congestive) heart failure: Secondary | ICD-10-CM | POA: Diagnosis present

## 2018-11-23 DIAGNOSIS — Z79899 Other long term (current) drug therapy: Secondary | ICD-10-CM

## 2018-11-23 DIAGNOSIS — I272 Pulmonary hypertension, unspecified: Secondary | ICD-10-CM | POA: Diagnosis present

## 2018-11-23 DIAGNOSIS — Z950 Presence of cardiac pacemaker: Secondary | ICD-10-CM

## 2018-11-23 DIAGNOSIS — E1122 Type 2 diabetes mellitus with diabetic chronic kidney disease: Secondary | ICD-10-CM | POA: Diagnosis present

## 2018-11-23 HISTORY — DX: Unspecified dementia without behavioral disturbance: F03.90

## 2018-11-23 LAB — BASIC METABOLIC PANEL
Anion gap: 12 (ref 5–15)
BUN: 66 mg/dL — ABNORMAL HIGH (ref 8–23)
CO2: 27 mmol/L (ref 22–32)
Calcium: 8.8 mg/dL — ABNORMAL LOW (ref 8.9–10.3)
Chloride: 99 mmol/L (ref 98–111)
Creatinine, Ser: 2.52 mg/dL — ABNORMAL HIGH (ref 0.44–1.00)
GFR calc Af Amer: 19 mL/min — ABNORMAL LOW (ref >60)
GFR calc non Af Amer: 17 mL/min — ABNORMAL LOW (ref >60)
GLUCOSE: 187 mg/dL — AB (ref 70–99)
POTASSIUM: 3.5 mmol/L (ref 3.5–5.1)
Sodium: 138 mmol/L (ref 135–145)

## 2018-11-23 LAB — CBC
HCT: 38.3 % (ref 36.0–46.0)
Hemoglobin: 11.4 g/dL — ABNORMAL LOW (ref 12.0–15.0)
MCH: 26.7 pg (ref 26.0–34.0)
MCHC: 29.8 g/dL — ABNORMAL LOW (ref 30.0–36.0)
MCV: 89.7 fL (ref 80.0–100.0)
Platelets: 480 10*3/uL — ABNORMAL HIGH (ref 150–400)
RBC: 4.27 MIL/uL (ref 3.87–5.11)
RDW: 18.6 % — ABNORMAL HIGH (ref 11.5–15.5)
WBC: 8 10*3/uL (ref 4.0–10.5)
nRBC: 0 % (ref 0.0–0.2)

## 2018-11-23 LAB — URINALYSIS, ROUTINE W REFLEX MICROSCOPIC
Bilirubin Urine: NEGATIVE
Glucose, UA: NEGATIVE mg/dL
Ketones, ur: NEGATIVE mg/dL
Nitrite: NEGATIVE
Protein, ur: NEGATIVE mg/dL
Specific Gravity, Urine: 1.009 (ref 1.005–1.030)
pH: 6 (ref 5.0–8.0)

## 2018-11-23 LAB — GLUCOSE, CAPILLARY: Glucose-Capillary: 175 mg/dL — ABNORMAL HIGH (ref 70–99)

## 2018-11-23 LAB — CBG MONITORING, ED: Glucose-Capillary: 177 mg/dL — ABNORMAL HIGH (ref 70–99)

## 2018-11-23 LAB — PROTIME-INR
INR: 2.8
Prothrombin Time: 29.4 seconds — ABNORMAL HIGH (ref 11.4–15.2)

## 2018-11-23 MED ORDER — BETHANECHOL CHLORIDE 25 MG PO TABS
25.0000 mg | ORAL_TABLET | Freq: Two times a day (BID) | ORAL | Status: DC
  Administered 2018-11-23 – 2018-11-25 (×4): 25 mg via ORAL
  Filled 2018-11-23 (×4): qty 1

## 2018-11-23 MED ORDER — WARFARIN SODIUM 1 MG PO TABS
1.0000 mg | ORAL_TABLET | Freq: Once | ORAL | Status: AC
  Administered 2018-11-23: 1 mg via ORAL
  Filled 2018-11-23: qty 1

## 2018-11-23 MED ORDER — ZINC OXIDE 40 % EX OINT
TOPICAL_OINTMENT | Freq: Four times a day (QID) | CUTANEOUS | Status: DC | PRN
  Filled 2018-11-23: qty 57

## 2018-11-23 MED ORDER — SODIUM CHLORIDE 0.9 % IV SOLN
1.0000 g | Freq: Once | INTRAVENOUS | Status: AC
  Administered 2018-11-23: 1 g via INTRAVENOUS
  Filled 2018-11-23: qty 10

## 2018-11-23 MED ORDER — POLYETHYLENE GLYCOL 3350 17 G PO PACK: 17.0000 g | PACK | Freq: Every day | ORAL | Status: DC | PRN

## 2018-11-23 MED ORDER — INSULIN ASPART 100 UNIT/ML ~~LOC~~ SOLN: 0.0000 [IU] | Freq: Every day | SUBCUTANEOUS | Status: DC

## 2018-11-23 MED ORDER — SODIUM CHLORIDE 0.9 % IV SOLN
1.0000 g | INTRAVENOUS | Status: DC
  Administered 2018-11-24: 1 g via INTRAVENOUS
  Filled 2018-11-23: qty 1
  Filled 2018-11-23: qty 10

## 2018-11-23 MED ORDER — ACETAMINOPHEN 325 MG PO TABS: 650.0000 mg | ORAL_TABLET | Freq: Four times a day (QID) | ORAL | Status: DC | PRN

## 2018-11-23 MED ORDER — PREGABALIN 75 MG PO CAPS
75.0000 mg | ORAL_CAPSULE | Freq: Every day | ORAL | Status: DC
  Administered 2018-11-23 – 2018-11-24 (×2): 75 mg via ORAL
  Filled 2018-11-23 (×2): qty 1

## 2018-11-23 MED ORDER — ONDANSETRON HCL 4 MG/2ML IJ SOLN: 4.0000 mg | Freq: Four times a day (QID) | INTRAMUSCULAR | Status: DC | PRN

## 2018-11-23 MED ORDER — SODIUM CHLORIDE 0.9 % IV SOLN
INTRAVENOUS | Status: DC
  Administered 2018-11-23 – 2018-11-25 (×3): via INTRAVENOUS

## 2018-11-23 MED ORDER — PANTOPRAZOLE SODIUM 40 MG PO TBEC
40.0000 mg | DELAYED_RELEASE_TABLET | Freq: Every day | ORAL | Status: DC
  Administered 2018-11-24 – 2018-11-25 (×2): 40 mg via ORAL
  Filled 2018-11-23 (×2): qty 1

## 2018-11-23 MED ORDER — SODIUM CHLORIDE 0.9 % IV BOLUS
500.0000 mL | Freq: Once | INTRAVENOUS | Status: AC
  Administered 2018-11-23: 500 mL via INTRAVENOUS

## 2018-11-23 MED ORDER — SODIUM CHLORIDE 0.9 % IV BOLUS: 1000.0000 mL | Freq: Once | INTRAVENOUS | Status: DC

## 2018-11-23 MED ORDER — INSULIN ASPART 100 UNIT/ML ~~LOC~~ SOLN
0.0000 [IU] | Freq: Three times a day (TID) | SUBCUTANEOUS | Status: DC
  Administered 2018-11-24: 2 [IU] via SUBCUTANEOUS
  Administered 2018-11-24 (×2): 1 [IU] via SUBCUTANEOUS
  Administered 2018-11-25: 3 [IU] via SUBCUTANEOUS
  Administered 2018-11-25: 1 [IU] via SUBCUTANEOUS

## 2018-11-23 MED ORDER — MEMANTINE HCL 10 MG PO TABS
5.0000 mg | ORAL_TABLET | Freq: Two times a day (BID) | ORAL | Status: DC
  Administered 2018-11-23 – 2018-11-25 (×4): 5 mg via ORAL
  Filled 2018-11-23 (×4): qty 1

## 2018-11-23 MED ORDER — SODIUM CHLORIDE 0.9% FLUSH: 3.0000 mL | Freq: Once | INTRAVENOUS | Status: DC

## 2018-11-23 MED ORDER — SENNOSIDES-DOCUSATE SODIUM 8.6-50 MG PO TABS: 1.0000 | ORAL_TABLET | Freq: Every evening | ORAL | Status: DC | PRN

## 2018-11-23 MED ORDER — ACETAMINOPHEN 650 MG RE SUPP: 650.0000 mg | Freq: Four times a day (QID) | RECTAL | Status: DC | PRN

## 2018-11-23 MED ORDER — METOPROLOL SUCCINATE ER 100 MG PO TB24
100.0000 mg | ORAL_TABLET | Freq: Every day | ORAL | Status: DC
  Administered 2018-11-24 – 2018-11-25 (×2): 100 mg via ORAL
  Filled 2018-11-23 (×2): qty 1

## 2018-11-23 MED ORDER — ONDANSETRON HCL 4 MG PO TABS: 4.0000 mg | ORAL_TABLET | Freq: Four times a day (QID) | ORAL | Status: DC | PRN

## 2018-11-23 MED ORDER — WARFARIN - PHARMACIST DOSING INPATIENT
Freq: Every day | Status: DC
  Administered 2018-11-24: 18:00:00

## 2018-11-23 MED ORDER — DILTIAZEM HCL ER COATED BEADS 120 MG PO CP24
120.0000 mg | ORAL_CAPSULE | Freq: Every day | ORAL | Status: DC
  Administered 2018-11-24 – 2018-11-25 (×2): 120 mg via ORAL
  Filled 2018-11-23 (×2): qty 1

## 2018-11-23 NOTE — ED Provider Notes (Signed)
[facility] DEPT Provider Note   CSN: [id number] Arrival date & time: 11/23/18  1400    History   Chief Complaint Chief Complaint  Patient presents with  . Abnormal Lab  . Weakness    HPI [name] [name] is a 85 y.o. female.     85yo female with history of dementia brought in by EMS from [facility] with following report: Pt has been having Generalized weakness, Pt lab showed abnormal potassium. Pt is a total care. Pt has Hx of CA, PM , CHF is on home O2 .  Pt is alert and oriented x 3.  Patient is alert to person and place only, denies any complaints.      Past Medical History:  Diagnosis Date  . Acute gastric ulcer without mention of hemorrhage, perforation, or obstruction 2006   EGD  . Anxiety   . Atrial fibrillation (HCC)    flecainide; coumadin  . Breast cancer ([place])    left  . Carotid artery disease ([place])    14/78: RICA 2-95%, LICA 62-13%  //  [place] 08/65: RICA 7-84%; LICA 69-62% >> FU 1 year   . Chronic diastolic heart failure (HCC)    a. echo 10/10: mild LVH, EF 55-60%, mild AI, mild MR, mild to mod LAE, mild RVE, severe RAE, mod TR, PASP 53  /  b.  Echo 3/14:  Mild LVH, EF 55%, Tr AI, MAC, mild MR, mod LAE, PASP 31, trivial eff. // c. echo 1/17: EF 55-60%, mild AI, MAC, mild MR, moderate BAE, moderate TR  . Chronic lower back pain   . Diabetic peripheral neuropathy ([place]) 03/09/2015  . Diverticulosis   . Esophagitis, unspecified 2006   EGD  . Hiatal hernia 2006   EGD  . History of nuclear stress test    a. Myoview 2/12: EF 69%, no scar or ischemia  . Hx of adenomatous colonic polyps   . Hypertension   . Mediastinal lymphadenopathy   . Memory difficulty 11/14/2016  . Mitral regurgitation    mild, echo, October, 2010  . Osteopenia   . Presence of permanent cardiac pacemaker   . Pulmonary embolus [facility]) 2006   2006, with DVT  . Pulmonary HTN ([place])    53 mmHg echo, 2010, moderate TR, mild right  ventricular enlargement  . Tachy-brady syndrome ([place])    s/p pacer 07/2009  . Thyroid nodule    non-neoplastic goiter  . Type II diabetes mellitus ([place])   . Venous insufficiency    Chronic    Patient Active Problem List   Diagnosis Date Noted  . AKI (acute kidney injury) ([place]) 11/23/2018  . Pressure injury of skin 10/24/2018  . Palliative care by specialist   . Fall at home, initial encounter 10/18/2018  . Pulmonary embolism on long-term anticoagulation therapy ([place]) 10/18/2018  . Syncope and collapse 10/18/2018  . Weakness 09/03/2018  . Pruritus 05/08/2018  . Morbid obesity due to excess calories ([place]) complicated by hbp 95/28/4132  . Solitary pulmonary nodule on lung CT 03/21/2017  . Amiodarone toxicity 03/21/2017  . Pacemaker complications 44/09/270  . CKD (chronic kidney disease), stage IV ([place]) 03/14/2017  . Recurrent falls 01/13/2017  . Cognitive change   . A-fib ([place]) 10/14/2016  . Dysuria 10/10/2016  . Chronic back pain 07/28/2016  . Diabetes mellitus type 2, noninsulin dependent ([place])   . Dyspnea on exertion 04/12/2016  . Hypertensive heart disease 03/28/2016  . Anemia, iron deficiency 10/22/2015  . Diabetic peripheral  neuropathy ([place]) 03/09/2015  . Chronic diastolic (congestive) heart failure ([place]) 09/07/2014  . Sick sinus syndrome ([place]) 03/09/2014  . Goals of care, counseling/discussion 11/04/2013  . Hypertension 06/21/2013  . Osteopenia   . Pulmonary HTN ([place])   . History of pulmonary embolism   . Carotid artery disease ([place])   . History of breast cancer   . Pacemaker-[employer]tronic   . Syncope   . Mitral regurgitation   . Mediastinal lymphadenopathy   . Acute lower UTI 07/19/2009    Past Surgical History:  Procedure Laterality Date  . ABDOMINAL HYSTERECTOMY    . ANKLE FRACTURE SURGERY Right   . BACK SURGERY    . BREAST LUMPECTOMY Left   . FRACTURE SURGERY    . INSERT / REPLACE / REMOVE PACEMAKER  06/30/2009   MDT Adalpta L implanted by Dr Olevia Perches  .  LUMBAR LAMINECTOMY  1973, 06/22/2011   "no hardware either time"     OB History   No obstetric history on file.      Home Medications    Prior to Admission medications   Medication Sig Start Date End Date Taking? Authorizing Provider  bethanechol (URECHOLINE) 25 MG tablet Take 25 mg by mouth 2 (two) times daily.   Yes [provider]  diltiazem (CARDIZEM CD) 120 MG 24 hr capsule Take 1 capsule (120 mg total) by mouth daily. 10/28/18  Yes Mariel Aloe, MD  liver oil-zinc oxide (DESITIN) 40 % ointment Apply topically 6 (six) times daily. Apply to buttocks, labia, inner upper thighs, upper posterior thighs 10/27/18  Yes Mariel Aloe, MD  memantine (NAMENDA) 5 MG tablet Take 1 tablet (5 mg total) by mouth 2 (two) times daily. 07/23/18  Yes Kathrynn Ducking, MD  metoprolol succinate (TOPROL-XL) 100 MG 24 hr tablet Take 1 tablet (100 mg total) by mouth daily. Take with or immediately following a meal. 10/28/18  Yes Mariel Aloe, MD  Multiple Vitamin (MULTIVITAMIN WITH MINERALS) TABS tablet Take 1 tablet by mouth daily. 10/28/18  Yes Mariel Aloe, MD  omeprazole (PRILOSEC) 20 MG capsule Take 20 mg by mouth daily.   Yes [provider]  ondansetron (ZOFRAN) 4 MG tablet Take 4 mg by mouth every 6 (six) hours as needed for nausea or vomiting.   Yes [provider]  polyethylene glycol (MIRALAX / GLYCOLAX) packet Take 17 g by mouth daily as needed for mild constipation.   Yes [provider]  potassium chloride SA (K-DUR,KLOR-CON) 20 MEQ tablet Take 20 mEq by mouth daily.   Yes [provider]  pregabalin (LYRICA) 75 MG capsule Take 1 capsule (75 mg total) by mouth at bedtime. 08/12/18  Yes Kathrynn Ducking, MD  torsemide (DEMADEX) 20 MG tablet Take 20 mg by mouth daily.   Yes [provider]  vitamin B-12 (CYANOCOBALAMIN) 500 MCG tablet Take 1,000 mcg by mouth daily.    Yes [provider]  acetaminophen (TYLENOL) 325 MG tablet  Take 650 mg by mouth every 6 (six) hours as needed (back pain).     [provider]  blood glucose meter kit and supplies KIT Dispense based on patient and insurance preference. Use before breakfast and at bedtime. Diagnosis code: E11.9 03/26/17   Nche, Charlene Brooke, NP  dimethicone 1 % cream Apply topically 2 (two) times daily as needed for dry skin. Patient not taking: Reported on 11/23/2018 10/27/18   Mariel Aloe, MD  feeding supplement, ENSURE ENLIVE, (ENSURE ENLIVE) LIQD Take 237 mLs  by mouth 2 (two) times daily between meals. Patient not taking: Reported on 11/23/2018 10/28/18   Mariel Aloe, MD  furosemide (LASIX) 80 MG tablet TAKE 1 TABLET (80 MG TOTAL) BY MOUTH 2 (TWO) TIMES DAILY. Patient not taking: Reported on 11/23/2018 01/13/18   Isaiah Serge, NP  nystatin cream (MYCOSTATIN) Apply topically 2 (two) times daily. Apply to bilateral groins Patient not taking: Reported on 11/23/2018 10/27/18   Mariel Aloe, MD  promethazine (PHENERGAN) 25 MG tablet Take 1 tablet (25 mg total) by mouth every 8 (eight) hours as needed for nausea or vomiting. Patient not taking: Reported on 11/23/2018 10/22/16   Hoyt Koch, MD  senna-docusate (SENOKOT-S) 8.6-50 MG tablet Take 1 tablet by mouth at bedtime as needed for mild constipation. 10/27/18   Mariel Aloe, MD  warfarin (COUMADIN) 2.5 MG tablet TAKE 1/2 TABLET TO 1 TABLET DAILY OR AS DIRECTED BY COUMADIN CLINIC Patient not taking: No sig reported 06/30/18   Dorothy Spark, MD    Family History Family History  Problem Relation Age of Onset  . Heart disease Mother   . Hypertension Mother   . COPD Father   . Hypertension Father   . Hypertension Sister   . Hypertension Brother   . Hypertension Daughter   . Colon cancer Neg Hx   . Heart attack Neg Hx   . Stroke Neg Hx     Social History Social History   Tobacco Use  . Smoking status: Former Smoker    Packs/day: 1.00    Years: 47.00    Pack years: 47.00     Types: Cigarettes    Last attempt to quit: 10/26/1998    Years since quitting: 20.0  . Smokeless tobacco: Never Used  Substance Use Topics  . Alcohol use: No    Comment: 10/14/2016 "I'll have a beer q once in awhile"  . Drug use: No     Allergies   Amiodarone; Flecainide; Morphine and related; Bactrim ds [sulfamethoxazole-trimethoprim]; Cephalexin; and Pineapple   Review of Systems Review of Systems  Unable to perform ROS: Dementia  Respiratory: Negative for shortness of breath.   Cardiovascular: Negative for chest pain.  Gastrointestinal: Negative for abdominal pain.     Physical Exam Updated Vital Signs BP (!) 97/57   Pulse 85   Temp 97.7 F (36.5 C) (Oral)   Resp (!) 22   Ht 6' (1.829 m)   SpO2 96%   BMI 30.71 kg/m   Physical Exam Vitals signs and nursing note reviewed.  Constitutional:      General: She is not in acute distress.    Appearance: She is well-developed. She is not diaphoretic.  HENT:     Head: Normocephalic and atraumatic.     Mouth/Throat:     Mouth: Mucous membranes are dry.  Cardiovascular:     Rate and Rhythm: Normal rate. Rhythm irregular.     Pulses: Normal pulses.     Heart sounds: Normal heart sounds. No murmur.  Pulmonary:     Effort: Pulmonary effort is normal.     Breath sounds: Normal breath sounds.  Abdominal:     Palpations: Abdomen is soft.     Tenderness: There is no abdominal tenderness.  Musculoskeletal:     Right lower leg: Edema present.     Left lower leg: Edema present.     Comments: Mild lower extremity pitting edema   Skin:    General: Skin is warm and dry.  Findings: No erythema or rash.  Neurological:     Mental Status: She is alert.  Psychiatric:        Behavior: Behavior normal.      ED Treatments / Results  Labs (all labs ordered are listed, but only abnormal results are displayed) Labs Reviewed  BASIC METABOLIC PANEL - Abnormal; Notable for the following components:      Result Value   Glucose,  Bld 187 (*)    BUN 66 (*)    Creatinine, Ser 2.52 (*)    Calcium 8.8 (*)    GFR calc non Af Amer 17 (*)    GFR calc Af Amer 19 (*)    All other components within normal limits  CBC - Abnormal; Notable for the following components:   Hemoglobin 11.4 (*)    MCHC 29.8 (*)    RDW 18.6 (*)    Platelets 480 (*)    All other components within normal limits  URINALYSIS, ROUTINE W REFLEX MICROSCOPIC - Abnormal; Notable for the following components:   APPearance CLOUDY (*)    Hgb urine dipstick MODERATE (*)    Leukocytes,Ua LARGE (*)    WBC, UA >50 (*)    Bacteria, UA MANY (*)    All other components within normal limits  PROTIME-INR - Abnormal; Notable for the following components:   Prothrombin Time 29.4 (*)    All other components within normal limits  CBG MONITORING, ED - Abnormal; Notable for the following components:   Glucose-Capillary 177 (*)    All other components within normal limits  URINE CULTURE    EKG EKG Interpretation  Date/Time:  Monday November 23 2018 15:05:42 EST Ventricular Rate:  77 PR Interval:    QRS Duration: 122 QT Interval:  416 QTC Calculation: 483 R Axis:   46 Text Interpretation:  Atrial fibrillation IVCD, consider atypical RBBB Confirmed by Lacretia Leigh ([id number]) on 11/23/2018 4:34:56 PM   Radiology No results found.  Procedures Procedures (including critical care time)  Medications Ordered in ED Medications  sodium chloride flush (NS) 0.9 % injection 3 mL (has no administration in time range)  sodium chloride 0.9 % bolus 500 mL (0 mLs Intravenous Stopped 11/23/18 1653)  cefTRIAXone (ROCEPHIN) 1 g in sodium chloride 0.9 % 100 mL IVPB (1 g Intravenous New Bag/Given 11/23/18 1653)     Initial Impression / Assessment and Plan / ED Course  I have reviewed the triage vital signs and the nursing notes.  Pertinent labs & imaging results that were available during my care of the patient were reviewed by me and considered in my medical decision  making (see chart for details).  Clinical Course as of Nov 23 1799  Mon Nov 23, 5064  [id number] 85 year old female sent from nursing facility for abnormal labs.  Patient has a history of dementia, is alert to person and denies any complaints.  On exam patient has minimal bilateral lower extremity pitting edema.  Lab work shows acute kidney injury with increasing creatinine from 1.3-2.5, CBC without significant changes, INR therapeutic at 2.8.  Urinalysis reviewed with evidence of urinary tract infection, given Rocephin and sent for culture.  Case discussed with Dr. Zenia Resides, ER attending, agrees with plan for admission for acute kidney injury with UTI.   [LM]  56 Case discussed with hospitalist who will consult for admission. Patient's daughter is now at bedside, states patient is sleeping more than usual, was told by nursing facility her K was low all weekend and was not improving  with PO K so patient was sent to the ER. Also told her kidney numbers were bad and worried about her CHF. Daughter states patient only eats or drinks if the staff feeds her, otherwise does not drink/eat on her own.    [LM]    Clinical Course User Index [LM] Tacy Learn, PA-C      Final Clinical Impressions(s) / ED Diagnoses   Final diagnoses:  AKI (acute kidney injury) [facility])  Urinary tract infection in female    ED Discharge Orders    None       Roque Lias 11/23/18 1801    Lacretia Leigh, MD 11/23/18 2302

## 2018-11-23 NOTE — ED Notes (Signed)
Called report  4 E.

## 2018-11-23 NOTE — ED Notes (Signed)
Called to give report RN unable to get report at this time and is with another pt, will call back.

## 2018-11-23 NOTE — H&P (Signed)
History and Physical   [name] [name] SNK:[id number] DOB: 10-18-1930 DOA: 11/23/2018  Referring MD/NP/PA: Suella Broad, PA PCP: Hoyt Koch, MD  Patient coming from: [facility]  Chief Complaint: Generalized weakness, abnormal labs  HPI: [name] [name] is an 85 y.o. female with a history of AFib, DVT/PE on coumadin, tachy-brady syndrome s/p PPM, breast CA s/p mastectomy, chronic HFpEF, frequent UTIs, T2DM, venous insufficiency, pulmonary HTN, stage IV CKD, dementia, and recent admission after a fall at home who presents 2/24 from the SNF to which she was discharged with a report of generalized weakness, low potassium and poor kidney function on labs. Patient unable to elaborate further due to dementia, so history also gleaned from medical record, EDP, and patient's daughter. The patient has had no significant problems since discharge to the facility, is transitioning into longterm care there. No report of decreased po intake, though the daughter feels she's not being fed or offered water very vigilantly. There have been no fevers, and the patient appears at her mental baseline. No vomiting or diarrhea.   ED Course: Found to be borderline hypotensive, improved with fluids. Creatinine up to 2.5 from 1.3 presumed baseline. No leukocytosis, afebrile, UA showing significant pyuria. Admission requested for UTI and AKI.   Review of Systems: The patient specifically denies fever, chills, headache, cough, sore throat, chest pain, palpitations, shortness of breath, abdominal pain, nausea, vomiting, diarrhea, dysuria, urinary frequency or urgency, though she is incontinent, and per HPI. All others reviewed and are negative.   Past Medical History:  Diagnosis Date  . Acute gastric ulcer without mention of hemorrhage, perforation, or obstruction 2006   EGD  . Anxiety   . Atrial fibrillation (HCC)    flecainide; coumadin  . Breast cancer ([place])    left  . Carotid artery disease ([place])    93/79: RICA 0-24%, LICA 09-73%  //  Car[place]ud [place] 53/29: RICA 9-24%; LICA 26-83% >> FU 1 year   . Chronic diastolic heart failure (HCC)    a. echo 10/10: mild LVH, EF 55-60%, mild AI, mild MR, mild to mod LAE, mild RVE, severe RAE, mod TR, PASP 53  /  b.  Echo 3/14:  Mild LVH, EF 55%, Tr AI, MAC, mild MR, mod LAE, PASP 31, trivial eff. // c. echo 1/17: EF 55-60%, mild AI, MAC, mild MR, moderate BAE, moderate TR  . Chronic lower back pain   . Diabetic peripheral neuropathy ([place]) 03/09/2015  . Diverticulosis   . Esophagitis, unspecified 2006   EGD  . Hiatal hernia 2006   EGD  . History of nuclear stress test    a. Myoview 2/12: EF 69%, no scar or ischemia  . Hx of adenomatous colonic polyps   . Hypertension   . Mediastinal lymphadenopathy   . Memory difficulty 11/14/2016  . Mitral regurgitation    mild, echo, October, 2010  . Osteopenia   . Presence of permanent cardiac pacemaker   . Pulmonary embolus [facility]) 2006   2006, with DVT  . Pulmonary HTN ([place])    53 mmHg echo, 2010, moderate TR, mild right ventricular enlargement  . Tachy-brady syndrome ([place])    s/p pacer 07/2009  . Thyroid nodule    non-neoplastic goiter  . Type II diabetes mellitus ([place])   . Venous insufficiency    Chronic   Past Surgical History:  Procedure Laterality Date  . ABDOMINAL HYSTERECTOMY    . ANKLE FRACTURE SURGERY Right   . BACK SURGERY    . BREAST LUMPECTOMY Left   .  FRACTURE SURGERY    . INSERT / REPLACE / REMOVE PACEMAKER  06/30/2009   MDT Adalpta L implanted by Dr Olevia Perches  . LUMBAR LAMINECTOMY  1973, 06/22/2011   "no hardware either time"   - Remote former smoker, not currently smoking in NH, no EtOH or other illicit substances. Daughter checks on her frequently.   Allergies  Allergen Reactions  . Amiodarone Shortness Of Breath and Nausea Only    "Toxicity," also  . Flecainide Shortness Of Breath and Other (See Comments)    "Toxicity," also   . Morphine And Related Nausea And Vomiting and Other  (See Comments)    Family requests no morphine due to past reaction. Pt stated it made her sick, severe nausea and vomiting  . Bactrim Ds [Sulfamethoxazole-Trimethoprim] Diarrhea  . Cephalexin Nausea And Vomiting  . Pineapple Itching    Severe itching   Family History  Problem Relation Age of Onset  . Heart disease Mother   . Hypertension Mother   . COPD Father   . Hypertension Father   . Hypertension Sister   . Hypertension Brother   . Hypertension Daughter   . Colon cancer Neg Hx   . Heart attack Neg Hx   . Stroke Neg Hx    - Family history otherwise reviewed and not pertinent.  Prior to Admission medications   Medication Sig Start Date End Date Taking? Authorizing Provider  acetaminophen (TYLENOL) 325 MG tablet Take 650 mg by mouth every 6 (six) hours as needed (back pain).     [provider]  bethanechol (URECHOLINE) 25 MG tablet Take 25 mg by mouth 2 (two) times daily.    [provider]  blood glucose meter kit and supplies KIT Dispense based on patient and insurance preference. Use before breakfast and at bedtime. Diagnosis code: E11.9 03/26/17   Nche, Charlene Brooke, NP  diltiazem (CARDIZEM CD) 120 MG 24 hr capsule Take 1 capsule (120 mg total) by mouth daily. 10/28/18   Mariel Aloe, MD  dimethicone 1 % cream Apply topically 2 (two) times daily as needed for dry skin. 10/27/18   Mariel Aloe, MD  feeding supplement, ENSURE ENLIVE, (ENSURE ENLIVE) LIQD Take 237 mLs by mouth 2 (two) times daily between meals. 10/28/18   Mariel Aloe, MD  furosemide (LASIX) 80 MG tablet TAKE 1 TABLET (80 MG TOTAL) BY MOUTH 2 (TWO) TIMES DAILY. 01/13/18   Isaiah Serge, NP  liver oil-zinc oxide (DESITIN) 40 % ointment Apply topically 6 (six) times daily. Apply to buttocks, labia, inner upper thighs, upper posterior thighs 10/27/18   Mariel Aloe, MD  memantine (NAMENDA) 5 MG tablet Take 1 tablet (5 mg total) by mouth 2 (two) times daily. 07/23/18   Kathrynn Ducking, MD    metoprolol succinate (TOPROL-XL) 100 MG 24 hr tablet Take 1 tablet (100 mg total) by mouth daily. Take with or immediately following a meal. 10/28/18   Mariel Aloe, MD  Multiple Vitamin (MULTIVITAMIN WITH MINERALS) TABS tablet Take 1 tablet by mouth daily. 10/28/18   Mariel Aloe, MD  nystatin cream (MYCOSTATIN) Apply topically 2 (two) times daily. Apply to bilateral groins 10/27/18   Mariel Aloe, MD  potassium chloride 20 MEQ/15ML (10%) SOLN Take 20 mEq by mouth daily as needed (if have not eaten a banana).    [provider]  pregabalin (LYRICA) 75 MG capsule Take 1 capsule (75 mg total) by mouth at bedtime. 08/12/18   Kathrynn Ducking, MD  promethazine (PHENERGAN) 25 MG tablet Take 1 tablet (25 mg total) by mouth every 8 (eight) hours as needed for nausea or vomiting. 10/22/16   Hoyt Koch, MD  senna-docusate (SENOKOT-S) 8.6-50 MG tablet Take 1 tablet by mouth at bedtime as needed for mild constipation. 10/27/18   Mariel Aloe, MD  vitamin B-12 (CYANOCOBALAMIN) 500 MCG tablet Take 500 mcg by mouth daily.     [provider]  warfarin (COUMADIN) 2.5 MG tablet TAKE 1/2 TABLET TO 1 TABLET DAILY OR AS DIRECTED BY COUMADIN CLINIC Patient taking differently: Take 1.25-2.5 mg by mouth See admin instructions. Take 1.25 mg tablet daily EXCEPT 2.5 mg tablet on Mondays. 06/30/18   Dorothy Spark, MD    Physical Exam: Vitals:   11/23/18 1408 11/23/18 1418 11/23/18 1420 11/23/18 1724  BP:   (!) 93/56 (!) 97/57  Pulse: 76   85  Resp: 15   (!) 22  Temp: 97.7 F (36.5 C)     TempSrc: Oral     SpO2: 96%   96%  Height:  6' (1.829 m)     Constitutional: Elderly female in no distress, calm demeanor Eyes: Lids and conjunctivae normal, PERRL ENMT: Mucous membranes are tacky. Posterior pharynx clear of any exudate or lesions. Poor/edentulous dentition.  Neck: normal, supple, no masses, no thyromegaly Respiratory: Non-labored breathing 2L O2 without accessory muscle  use. Clear breath sounds to auscultation bilaterally Cardiovascular: Irreg irreg in 40'J, II/VI systolic murmur at apex, no other murmurs, rubs, or gallops. + carotid bruits. No JVD. trace LE edema. Palpable pedal pulses. Abdomen: Normoactive bowel sounds. No tenderness, non-distended, and no masses palpated. No hepatosplenomegaly. GU: No indwelling catheter Musculoskeletal: No clubbing / cyanosis. No joint deformity upper and lower extremities. Good ROM, no contractures. Normal muscle tone.  Skin: Warm, dry. Left lower leg with chronic hyperpigmentation, no other significant lesions noted.  Neurologic: CN II-XII grossly intact. Speech normal. Has stocking-glove distribution decreased sensation which is chronic, otherwise no focal deficits in motor strength or sensation in all extremities. Psychiatric: Alert, oriented only to person. Impaired judgment, recall. Euthymic, broad affect.  Labs on Admission: I have personally reviewed following labs and imaging studies  CBC: Recent Labs  Lab 11/23/18 1508  WBC 8.0  HGB 11.4*  HCT 38.3  MCV 89.7  PLT 811*   Basic Metabolic Panel: Recent Labs  Lab 11/23/18 1508  NA 138  K 3.5  CL 99  CO2 27  GLUCOSE 187*  BUN 66*  CREATININE 2.52*  CALCIUM 8.8*   Coagulation Profile: Recent Labs  Lab 11/23/18 1516  INR 2.8   Urine analysis:    Component Value Date/Time   COLORURINE YELLOW 11/23/2018 1502   APPEARANCEUR CLOUDY (A) 11/23/2018 1502   APPEARANCEUR Clear 01/06/2017 1601   LABSPEC 1.009 11/23/2018 1502   LABSPEC 1.008 11/30/2011 0748   PHURINE 6.0 11/23/2018 1502   GLUCOSEU NEGATIVE 11/23/2018 1502   GLUCOSEU NEGATIVE 09/03/2018 1041   HGBUR MODERATE (A) 11/23/2018 1502   BILIRUBINUR NEGATIVE 11/23/2018 1502   BILIRUBINUR Neg 05/08/2018 1611   BILIRUBINUR Negative 01/06/2017 1601   BILIRUBINUR Negative 11/30/2011 0748   KETONESUR NEGATIVE 11/23/2018 1502   PROTEINUR NEGATIVE 11/23/2018 1502   UROBILINOGEN 1.0 09/03/2018  1041   NITRITE NEGATIVE 11/23/2018 1502   LEUKOCYTESUR LARGE (A) 11/23/2018 1502   LEUKOCYTESUR 3+ 11/30/2011 0748   EKG: Independently reviewed. Paced AFib.    Assessment/Plan Principal Problem:   AKI (acute kidney injury) ([place]) Active Problems:   Acute lower  UTI   Pulmonary HTN ([place])   History of pulmonary embolism   History of breast cancer   Mitral regurgitation   Hypertension   Chronic diastolic (congestive) heart failure (HCC)   Diabetic peripheral neuropathy (HCC)   Diabetes mellitus type 2, noninsulin dependent (HCC)   A-fib (HCC)   CKD (chronic kidney disease), stage IV (HCC)   Weakness   AKI on stage IV CKD: Unclear etiology, though could be prerenal due to decreased po intake (would fit with reported hypokalemia), though has no nausea or vomiting. Also likely due to infection. No casts in urinalysis. - Judicious IV fluids with hx CHF. Currently appears slightly volume down on exam.  - Recheck BMP in AM. If Cr not improving, would continue further work up including U/S, urine lytes, etc.   UTI: Without sepsis. With hematuria, bacteriuria, pyuria. Unable to discern symptoms, but will treat as UTI pending urine culture - Continue ceftriaxone, has tolerated this, though intolerant to keflex (N/V). - Monitor urine culture sent from ED  Chronic combined HFrEF: Recent echo showed EF 45%, unable to discern diastolic dysfunction with AFib. Also seemed to have RV overload. - Judicious IV fluids - Will avoid hypotension but feel BB, CCB should continue given cardiac history.  NIDT2DM with peripheral neuropathy:  - Sensitive SSI AC/HS - Continue lyrica 43m qHS  Permanent AFib, sick sinus snydrome, tachy-brady syndrome s/p PPM:  - Continue coumadin per pharmacy, INR therapeutic. - Continue metoprolol, diltiazem, hold diuretic.   History of PE:  - Anticoagulation as above  Dementia:  - Delirium precautions - Continue namenda  GERD:  - PPI  DVT prophylaxis: Coumadin   Code Status: Full confirmed with daughter at admission  Family Communication: Daughter at bedside [place] Plan: [place] Consults called: None  Admission status: Observation    RPatrecia Pour MD [facility] [email or web address] Password [email or web address] [facility] LLC2/24/2020, 5:45 PM

## 2018-11-23 NOTE — ED Triage Notes (Signed)
Pt arrived via EMS from [facility] rehab. Pt has been having Generalized weakness, Pt lab showed abnormal potassium. Pt is a total care. Pt has Hx of CA, PM , CHF is on home O2 .  Pt is alert and oriented x 3       CBG 237

## 2018-11-23 NOTE — Progress Notes (Signed)
[name] [name] for warfarin Indication: atrial fibrillation  Allergies  Allergen Reactions  . Amiodarone Shortness Of Breath and Nausea Only    "Toxicity," also  . Flecainide Shortness Of Breath and Other (See Comments)    "Toxicity," also   . Morphine And Related Nausea And Vomiting and Other (See Comments)    Family requests no morphine due to past reaction. Pt stated it made her sick, severe nausea and vomiting  . Bactrim Ds [Sulfamethoxazole-Trimethoprim] Diarrhea  . Cephalexin Nausea And Vomiting  . Pineapple Itching    Severe itching    Patient Measurements: Height: 6' (182.9 cm) IBW/kg (Calculated) : 73.1  Vital Signs: Temp: 97.7 F (36.5 C) (02/24 1408) Temp Source: Oral (02/24 1408) BP: 104/57 (02/24 1800) Pulse Rate: 85 (02/24 1724)  Labs: Recent Labs    11/23/18 1508 11/23/18 1516  HGB 11.4*  --   HCT 38.3  --   PLT 480*  --   LABPROT  --  29.4*  INR  --  2.8  CREATININE 2.52*  --     CrCl cannot be calculated (Unknown ideal weight.).   Medical History: Past Medical History:  Diagnosis Date  . Acute gastric ulcer without mention of hemorrhage, perforation, or obstruction 2006   EGD  . Anxiety   . Atrial fibrillation (HCC)    flecainide; coumadin  . Breast cancer ([place])    left  . Carotid artery disease ([place])    66/44: RICA 0-34%, LICA 74-25%  //  Car[place]ud [place] 95/63: RICA 8-75%; LICA 64-33% >> FU 1 year   . Chronic diastolic heart failure (HCC)    a. echo 10/10: mild LVH, EF 55-60%, mild AI, mild MR, mild to mod LAE, mild RVE, severe RAE, mod TR, PASP 53  /  b.  Echo 3/14:  Mild LVH, EF 55%, Tr AI, MAC, mild MR, mod LAE, PASP 31, trivial eff. // c. echo 1/17: EF 55-60%, mild AI, MAC, mild MR, moderate BAE, moderate TR  . Chronic lower back pain   . Diabetic peripheral neuropathy ([place]) 03/09/2015  . Diverticulosis   . Esophagitis, unspecified 2006   EGD  . Hiatal hernia 2006   EGD  . History of nuclear stress  test    a. Myoview 2/12: EF 69%, no scar or ischemia  . Hx of adenomatous colonic polyps   . Hypertension   . Mediastinal lymphadenopathy   . Memory difficulty 11/14/2016  . Mitral regurgitation    mild, echo, October, 2010  . Osteopenia   . Presence of permanent cardiac pacemaker   . Pulmonary embolus [facility]) 2006   2006, with DVT  . Pulmonary HTN ([place])    53 mmHg echo, 2010, moderate TR, mild right ventricular enlargement  . Tachy-brady syndrome ([place])    s/p pacer 07/2009  . Thyroid nodule    non-neoplastic goiter  . Type II diabetes mellitus ([place])   . Venous insufficiency    Chronic    Medications:  (Not in a hospital admission)  Scheduled:  . sodium chloride flush  3 mL Intravenous Once  . warfarin  1 mg Oral Once  . [START ON 11/24/2018] Warfarin - Pharmacist Dosing Inpatient   Does not apply q1800    Assessment: 73 yoF with PMH AFib, DVT/PE on warfarin, tachy-brady syndrome s/p PPM, breast CA s/p mastectomy, chronic HFpEF, frequent UTIs, DM2, venous insufficiency, pulmonary HTN, stage IV CKD, dementia, and recent admission after a fall at home, presenting from the SNF to which  she was discharged with weakness and abnormal BMP   Baseline INR therapeutic  Prior anticoagulation: warfarin 1.5 mg daily, BUT according to SNF, last dose of warfarin given 2/20   Significant events:  Today, 11/23/2018:  CBC: Hgb slightly low, Plt elevated  INR therapeutic despite missing warfarin the last 3 days  Major drug interactions: none  No bleeding issues per nursing  Diet not ordered yet  Goal of Therapy: INR 2-3  Plan:  Warfarin 1 mg PO tonight at 18:00 - will dose conservatively given therapeutic INR despite allegedly missing doses  Daily INR  CBC at least q72 hr while on warfarin  Monitor for signs of bleeding or thrombosis   Reuel Boom, PharmD, BCPS [phone] 11/23/2018, 6:52 PM

## 2018-11-23 NOTE — ED Notes (Signed)
ED TO INPATIENT HANDOFF REPORT  Name/Age/Gender [name] [name] 85 y.o. female  Code Status Code Status History    Date Active Date Inactive Code Status Order ID Comments User Context   10/18/2018 1401 10/28/2018 0201 Full Code [id number]  Cristal Deer, MD ED   05/11/2018 1930 05/14/2018 1904 Full Code [id number]  Jani Gravel, MD ED   03/18/2017 2019 03/22/2017 1858 Full Code [id number]  Mariel Aloe, MD Inpatient   03/14/2017 1630 03/15/2017 1928 Full Code [id number]  Norval Morton, MD ED   10/14/2016 2024 10/15/2016 1919 Full Code [id number]  Florencia Reasons, MD Inpatient   04/12/2016 1937 04/16/2016 1951 Full Code [id number]  Florencia Reasons, MD Inpatient   08/27/2014 1138 08/28/2014 1627 Full Code [id number]  Rogelia Mire, NP ED   08/03/2014 0354 08/05/2014 1731 Full Code [id number]  Ivor Costa, MD Inpatient    Advance Directive Documentation     Most Recent Value  Type of Advance Directive  Out of facility DNR (pink MOST or yellow form)  Pre-existing out of facility DNR order (yellow form or pink MOST form)  -  "MOST" Form in Place?  -      Home/SNF/Other Skilled nursing facility  Chief Complaint Low Potassium  Level of Care/Admitting Diagnosis ED Disposition    ED Disposition Condition [place]: [facility] [100102]  Level of Care: Med-Surg [16]  Diagnosis: AKI (acute kidney injury) [facility]) [096283]  Admitting Physician: Patrecia Pour [phone]  Attending Physician: Patrecia Pour [phone]  PT Class (Do Not Modify): Observation [104]  PT Acc Code (Do Not Modify): Observation [10022]       Medical History Past Medical History:  Diagnosis Date  . Acute gastric ulcer without mention of hemorrhage, perforation, or obstruction 2006   EGD  . Anxiety   . Atrial fibrillation (HCC)    flecainide; coumadin  . Breast cancer ([place])    left  . Carotid artery disease ([place])    46/50: RICA 3-54%, LICA 65-68%  //  Carotiud [place] 12/75: RICA 1-70%; LICA  01-74% >> FU 1 year   . Chronic diastolic heart failure (HCC)    a. echo 10/10: mild LVH, EF 55-60%, mild AI, mild MR, mild to mod LAE, mild RVE, severe RAE, mod TR, PASP 53  /  b.  Echo 3/14:  Mild LVH, EF 55%, Tr AI, MAC, mild MR, mod LAE, PASP 31, trivial eff. // c. echo 1/17: EF 55-60%, mild AI, MAC, mild MR, moderate BAE, moderate TR  . Chronic lower back pain   . Diabetic peripheral neuropathy ([place]) 03/09/2015  . Diverticulosis   . Esophagitis, unspecified 2006   EGD  . Hiatal hernia 2006   EGD  . History of nuclear stress test    a. Myoview 2/12: EF 69%, no scar or ischemia  . Hx of adenomatous colonic polyps   . Hypertension   . Mediastinal lymphadenopathy   . Memory difficulty 11/14/2016  . Mitral regurgitation    mild, echo, October, 2010  . Osteopenia   . Presence of permanent cardiac pacemaker   . Pulmonary embolus [facility]) 2006   2006, with DVT  . Pulmonary HTN ([place])    53 mmHg echo, 2010, moderate TR, mild right ventricular enlargement  . Tachy-brady syndrome ([place])    s/p pacer 07/2009  . Thyroid nodule    non-neoplastic goiter  . Type II diabetes mellitus ([place])   . Venous insufficiency    Chronic  Allergies Allergies  Allergen Reactions  . Amiodarone Shortness Of Breath and Nausea Only    "Toxicity," also  . Flecainide Shortness Of Breath and Other (See Comments)    "Toxicity," also   . Morphine And Related Nausea And Vomiting and Other (See Comments)    Family requests no morphine due to past reaction. Pt stated it made her sick, severe nausea and vomiting  . Bactrim Ds [Sulfamethoxazole-Trimethoprim] Diarrhea  . Cephalexin Nausea And Vomiting  . Pineapple Itching    Severe itching    IV Location/Drains/Wounds Patient Lines/Drains/Airways Status   Active Line/Drains/Airways    Name:   Placement date:   Placement time:   Site:   Days:   Peripheral IV 10/18/18 Right Antecubital   10/18/18    1114    Antecubital   36   Peripheral IV 10/26/18  Right;Posterior Forearm   10/26/18    2118    Forearm   28   Peripheral IV 11/23/18 Right Arm   11/23/18    1540    Arm   less than 1   Urethral Catheter Sarah RN Double-lumen 16 Fr.   10/27/18    1600    Double-lumen   27   Pressure Injury 10/22/18 Stage II -  Partial thickness loss of dermis presenting as a shallow open ulcer with a red, pink wound bed without slough. 100% pink   10/22/18    1149     32   Pressure Injury 10/22/18 Stage I -  Intact skin with non-blanchable redness of a localized area usually over a bony prominence. blister   10/22/18    -     32   Pressure Injury 10/22/18 Stage I -  Intact skin with non-blanchable redness of a localized area usually over a bony prominence.   10/22/18    1159     32   Pressure Injury 10/22/18 Stage I -  Intact skin with non-blanchable redness of a localized area usually over a bony prominence.    10/22/18    1201     32   Pressure Injury   -    -               Labs/Imaging Results for orders placed or performed during the hospital encounter of 11/23/18 (from the past 48 hour(s))  Urinalysis, Routine w reflex microscopic     Status: Abnormal   Collection Time: 11/23/18  3:02 PM  Result Value Ref Range   Color, Urine YELLOW YELLOW   APPearance CLOUDY (A) CLEAR   Specific Gravity, Urine 1.009 1.005 - 1.030   pH 6.0 5.0 - 8.0   Glucose, UA NEGATIVE NEGATIVE mg/dL   Hgb urine dipstick MODERATE (A) NEGATIVE   Bilirubin Urine NEGATIVE NEGATIVE   Ketones, ur NEGATIVE NEGATIVE mg/dL   Protein, ur NEGATIVE NEGATIVE mg/dL   Nitrite NEGATIVE NEGATIVE   Leukocytes,Ua LARGE (A) NEGATIVE   RBC / HPF 21-50 0 - 5 RBC/hpf   WBC, UA >50 (H) 0 - 5 WBC/hpf   Bacteria, UA MANY (A) NONE SEEN   Squamous Epithelial / LPF 0-5 0 - 5    Comment: Performed at [facility], [place] [address], [place], [place] [address]  Basic metabolic panel     Status: Abnormal   Collection Time: 11/23/18  3:08 PM  Result Value Ref Range   Sodium 138 135 -  145 mmol/L   Potassium 3.5 3.5 - 5.1 mmol/L   Chloride 99 98 -  111 mmol/L   CO2 27 22 - 32 mmol/L   Glucose, Bld 187 (H) 70 - 99 mg/dL   BUN 66 (H) 8 - 23 mg/dL   Creatinine, Ser 2.52 (H) 0.44 - 1.00 mg/dL   Calcium 8.8 (L) 8.9 - 10.3 mg/dL   GFR calc non Af Amer 17 (L) >60 mL/min   GFR calc Af Amer 19 (L) >60 mL/min   Anion gap 12 5 - 15    Comment: Performed at [facility], [place] [address] [place], [place] [address]  CBC     Status: Abnormal   Collection Time: 11/23/18  3:08 PM  Result Value Ref Range   WBC 8.0 4.0 - 10.5 K/uL   RBC 4.27 3.87 - 5.11 MIL/uL   Hemoglobin 11.4 (L) 12.0 - 15.0 g/dL   HCT 38.3 36.0 - 46.0 %   MCV 89.7 80.0 - 100.0 fL   MCH 26.7 26.0 - 34.0 pg   MCHC 29.8 (L) 30.0 - 36.0 g/dL   RDW 18.6 (H) 11.5 - 15.5 %   Platelets 480 (H) 150 - 400 K/uL   nRBC 0.0 0.0 - 0.2 %    Comment: Performed at [facility], [place] [address] [place], [place] [address]  CBG monitoring, ED     Status: Abnormal   Collection Time: 11/23/18  3:08 PM  Result Value Ref Range   Glucose-Capillary 177 (H) 70 - 99 mg/dL  Protime-INR     Status: Abnormal   Collection Time: 11/23/18  3:16 PM  Result Value Ref Range   Prothrombin Time 29.4 (H) 11.4 - 15.2 seconds   INR 2.8     Comment: Performed at [facility], [place] [address], [place], [place] [address]   No results found. EKG Interpretation  Date/Time:  Monday November 23 2018 15:05:42 EST Ventricular Rate:  77 PR Interval:    QRS Duration: 122 QT Interval:  416 QTC Calculation: 483 R Axis:   46 Text Interpretation:  Atrial fibrillation IVCD, consider atypical RBBB Confirmed by Lacretia Leigh ([id number]) on 11/23/2018 4:34:56 PM   Pending Labs Unresulted Labs (From admission, onward)    Start     Ordered   11/23/18 1600  Urine culture  Add-on,   STAT     11/23/18 1559          Vitals/Pain Today's Vitals   11/23/18 1418 11/23/18 1420 11/23/18 1540 11/23/18 1724   BP:  (!) 93/56  (!) 97/57  Pulse:    85  Resp:    (!) 22  Temp:      TempSrc:      SpO2:    96%  Height: 6' (1.829 m)     PainSc:  0-No pain 0-No pain     Isolation Precautions No active isolations  Medications Medications  sodium chloride flush (NS) 0.9 % injection 3 mL (has no administration in time range)  sodium chloride 0.9 % bolus 500 mL (0 mLs Intravenous Stopped 11/23/18 1653)  cefTRIAXone (ROCEPHIN) 1 g in sodium chloride 0.9 % 100 mL IVPB (1 g Intravenous New Bag/Given 11/23/18 1653)    Mobility non-ambulatory

## 2018-11-24 ENCOUNTER — Encounter (HOSPITAL_COMMUNITY): Payer: Self-pay | Admitting: Family Medicine

## 2018-11-24 DIAGNOSIS — N184 Chronic kidney disease, stage 4 (severe): Secondary | ICD-10-CM | POA: Diagnosis not present

## 2018-11-24 DIAGNOSIS — G8929 Other chronic pain: Secondary | ICD-10-CM | POA: Diagnosis present

## 2018-11-24 DIAGNOSIS — F0390 Unspecified dementia without behavioral disturbance: Secondary | ICD-10-CM

## 2018-11-24 DIAGNOSIS — I4821 Permanent atrial fibrillation: Secondary | ICD-10-CM | POA: Diagnosis not present

## 2018-11-24 DIAGNOSIS — I272 Pulmonary hypertension, unspecified: Secondary | ICD-10-CM | POA: Diagnosis present

## 2018-11-24 DIAGNOSIS — I4891 Unspecified atrial fibrillation: Secondary | ICD-10-CM | POA: Diagnosis not present

## 2018-11-24 DIAGNOSIS — N183 Chronic kidney disease, stage 3 (moderate): Secondary | ICD-10-CM | POA: Diagnosis not present

## 2018-11-24 DIAGNOSIS — I5023 Acute on chronic systolic (congestive) heart failure: Secondary | ICD-10-CM | POA: Diagnosis not present

## 2018-11-24 DIAGNOSIS — R41841 Cognitive communication deficit: Secondary | ICD-10-CM | POA: Diagnosis not present

## 2018-11-24 DIAGNOSIS — Z79899 Other long term (current) drug therapy: Secondary | ICD-10-CM | POA: Diagnosis not present

## 2018-11-24 DIAGNOSIS — Z86711 Personal history of pulmonary embolism: Secondary | ICD-10-CM | POA: Diagnosis not present

## 2018-11-24 DIAGNOSIS — Z7901 Long term (current) use of anticoagulants: Secondary | ICD-10-CM | POA: Diagnosis not present

## 2018-11-24 DIAGNOSIS — Z853 Personal history of malignant neoplasm of breast: Secondary | ICD-10-CM | POA: Diagnosis not present

## 2018-11-24 DIAGNOSIS — N390 Urinary tract infection, site not specified: Secondary | ICD-10-CM | POA: Diagnosis not present

## 2018-11-24 DIAGNOSIS — I959 Hypotension, unspecified: Secondary | ICD-10-CM | POA: Diagnosis not present

## 2018-11-24 DIAGNOSIS — E876 Hypokalemia: Secondary | ICD-10-CM

## 2018-11-24 DIAGNOSIS — M545 Low back pain: Secondary | ICD-10-CM | POA: Diagnosis present

## 2018-11-24 DIAGNOSIS — Z9981 Dependence on supplemental oxygen: Secondary | ICD-10-CM | POA: Diagnosis not present

## 2018-11-24 DIAGNOSIS — I130 Hypertensive heart and chronic kidney disease with heart failure and stage 1 through stage 4 chronic kidney disease, or unspecified chronic kidney disease: Secondary | ICD-10-CM | POA: Diagnosis not present

## 2018-11-24 DIAGNOSIS — Z950 Presence of cardiac pacemaker: Secondary | ICD-10-CM | POA: Diagnosis not present

## 2018-11-24 DIAGNOSIS — Z87891 Personal history of nicotine dependence: Secondary | ICD-10-CM | POA: Diagnosis not present

## 2018-11-24 DIAGNOSIS — R627 Adult failure to thrive: Secondary | ICD-10-CM | POA: Diagnosis not present

## 2018-11-24 DIAGNOSIS — M2550 Pain in unspecified joint: Secondary | ICD-10-CM | POA: Diagnosis not present

## 2018-11-24 DIAGNOSIS — Z90710 Acquired absence of both cervix and uterus: Secondary | ICD-10-CM | POA: Diagnosis not present

## 2018-11-24 DIAGNOSIS — M6281 Muscle weakness (generalized): Secondary | ICD-10-CM | POA: Diagnosis not present

## 2018-11-24 DIAGNOSIS — R1312 Dysphagia, oropharyngeal phase: Secondary | ICD-10-CM | POA: Diagnosis not present

## 2018-11-24 DIAGNOSIS — Z7401 Bed confinement status: Secondary | ICD-10-CM | POA: Diagnosis not present

## 2018-11-24 DIAGNOSIS — E1142 Type 2 diabetes mellitus with diabetic polyneuropathy: Secondary | ICD-10-CM | POA: Diagnosis not present

## 2018-11-24 DIAGNOSIS — R69 Illness, unspecified: Secondary | ICD-10-CM | POA: Diagnosis not present

## 2018-11-24 DIAGNOSIS — I5032 Chronic diastolic (congestive) heart failure: Secondary | ICD-10-CM | POA: Diagnosis not present

## 2018-11-24 DIAGNOSIS — N179 Acute kidney failure, unspecified: Secondary | ICD-10-CM | POA: Diagnosis not present

## 2018-11-24 DIAGNOSIS — R1311 Dysphagia, oral phase: Secondary | ICD-10-CM | POA: Diagnosis not present

## 2018-11-24 DIAGNOSIS — M6282 Rhabdomyolysis: Secondary | ICD-10-CM | POA: Diagnosis not present

## 2018-11-24 DIAGNOSIS — R2689 Other abnormalities of gait and mobility: Secondary | ICD-10-CM | POA: Diagnosis not present

## 2018-11-24 DIAGNOSIS — Z9010 Acquired absence of unspecified breast and nipple: Secondary | ICD-10-CM | POA: Diagnosis not present

## 2018-11-24 DIAGNOSIS — E1122 Type 2 diabetes mellitus with diabetic chronic kidney disease: Secondary | ICD-10-CM | POA: Diagnosis not present

## 2018-11-24 DIAGNOSIS — K219 Gastro-esophageal reflux disease without esophagitis: Secondary | ICD-10-CM | POA: Diagnosis present

## 2018-11-24 DIAGNOSIS — R2681 Unsteadiness on feet: Secondary | ICD-10-CM | POA: Diagnosis not present

## 2018-11-24 HISTORY — DX: Unspecified dementia, unspecified severity, without behavioral disturbance, psychotic disturbance, mood disturbance, and anxiety: F03.90

## 2018-11-24 LAB — PROTIME-INR
INR: 2.4 — ABNORMAL HIGH (ref 0.8–1.2)
Prothrombin Time: 25.6 seconds — ABNORMAL HIGH (ref 11.4–15.2)

## 2018-11-24 LAB — BASIC METABOLIC PANEL
Anion gap: 10 (ref 5–15)
BUN: 66 mg/dL — ABNORMAL HIGH (ref 8–23)
CO2: 26 mmol/L (ref 22–32)
Calcium: 8.5 mg/dL — ABNORMAL LOW (ref 8.9–10.3)
Chloride: 103 mmol/L (ref 98–111)
Creatinine, Ser: 2.46 mg/dL — ABNORMAL HIGH (ref 0.44–1.00)
GFR calc Af Amer: 20 mL/min — ABNORMAL LOW (ref >60)
GFR calc non Af Amer: 17 mL/min — ABNORMAL LOW (ref >60)
Glucose, Bld: 159 mg/dL — ABNORMAL HIGH (ref 70–99)
POTASSIUM: 3.1 mmol/L — AB (ref 3.5–5.1)
Sodium: 139 mmol/L (ref 135–145)

## 2018-11-24 LAB — GLUCOSE, CAPILLARY
GLUCOSE-CAPILLARY: 175 mg/dL — AB (ref 70–99)
Glucose-Capillary: 146 mg/dL — ABNORMAL HIGH (ref 70–99)
Glucose-Capillary: 133 mg/dL — ABNORMAL HIGH (ref 70–99)
Glucose-Capillary: 133 mg/dL — ABNORMAL HIGH (ref 70–99)

## 2018-11-24 LAB — MRSA PCR SCREENING: MRSA BY PCR: NEGATIVE

## 2018-11-24 MED ORDER — POTASSIUM CHLORIDE CRYS ER 20 MEQ PO TBCR
40.0000 meq | EXTENDED_RELEASE_TABLET | Freq: Once | ORAL | Status: AC
  Administered 2018-11-24: 40 meq via ORAL
  Filled 2018-11-24: qty 2

## 2018-11-24 MED ORDER — ENSURE ENLIVE PO LIQD
237.0000 mL | Freq: Two times a day (BID) | ORAL | Status: DC
  Administered 2018-11-24 – 2018-11-25 (×3): 237 mL via ORAL

## 2018-11-24 MED ORDER — ADULT MULTIVITAMIN LIQUID CH
15.0000 mL | Freq: Every day | ORAL | Status: DC
  Administered 2018-11-25: 15 mL via ORAL
  Filled 2018-11-24 (×2): qty 15

## 2018-11-24 MED ORDER — WARFARIN SODIUM 1 MG PO TABS
1.5000 mg | ORAL_TABLET | Freq: Once | ORAL | Status: AC
  Administered 2018-11-24: 1.5 mg via ORAL
  Filled 2018-11-24: qty 1

## 2018-11-24 NOTE — Progress Notes (Signed)
[place] for warfarin Indication: atrial fibrillation and history of VTE  Allergies  Allergen Reactions  . Amiodarone Shortness Of Breath and Nausea Only    "Toxicity," also  . Flecainide Shortness Of Breath and Other (See Comments)    "Toxicity," also   . Morphine And Related Nausea And Vomiting and Other (See Comments)    Family requests no morphine due to past reaction. Pt stated it made her sick, severe nausea and vomiting  . Bactrim Ds [Sulfamethoxazole-Trimethoprim] Diarrhea  . Cephalexin Nausea And Vomiting  . Pineapple Itching    Severe itching    Patient Measurements: Height: 6' (182.9 cm) Weight: 218 lb 14.7 oz (99.3 kg) IBW/kg (Calculated) : 73.1  Vital Signs: Temp: 98.7 F (37.1 C) (02/25 0653) Temp Source: Oral (02/25 0653) BP: 147/87 (02/25 0653) Pulse Rate: 103 (02/25 0925)  Labs: Recent Labs    11/23/18 1508 11/23/18 1516 11/24/18 0538  HGB 11.4*  --   --   HCT 38.3  --   --   PLT 480*  --   --   LABPROT  --  29.4* 25.6*  INR  --  2.8 2.4*  CREATININE 2.52*  --  2.46*    Estimated Creatinine Clearance: 21.7 mL/min (A) (by C-G formula based on SCr of 2.46 mg/dL (H)).   Medical History: Past Medical History:  Diagnosis Date  . Acute gastric ulcer without mention of hemorrhage, perforation, or obstruction 2006   EGD  . Anxiety   . Atrial fibrillation (HCC)    flecainide; coumadin  . Breast cancer ([place])    left  . Carotid artery disease ([place])    44/96: RICA 7-59%, LICA 16-38%  //  Carotiud [place] 46/65: RICA 9-93%; LICA 57-01% >> FU 1 year   . Chronic diastolic heart failure (HCC)    a. echo 10/10: mild LVH, EF 55-60%, mild AI, mild MR, mild to mod LAE, mild RVE, severe RAE, mod TR, PASP 53  /  b.  Echo 3/14:  Mild LVH, EF 55%, Tr AI, MAC, mild MR, mod LAE, PASP 31, trivial eff. // c. echo 1/17: EF 55-60%, mild AI, MAC, mild MR, moderate BAE, moderate TR  . Chronic lower back pain   . Diabetic peripheral  neuropathy ([place]) 03/09/2015  . Diverticulosis   . Esophagitis, unspecified 2006   EGD  . Hiatal hernia 2006   EGD  . History of nuclear stress test    a. Myoview 2/12: EF 69%, no scar or ischemia  . Hx of adenomatous colonic polyps   . Hypertension   . Mediastinal lymphadenopathy   . Memory difficulty 11/14/2016  . Mitral regurgitation    mild, echo, October, 2010  . Osteopenia   . Presence of permanent cardiac pacemaker   . Pulmonary embolus [facility]) 2006   2006, with DVT  . Pulmonary HTN ([place])    53 mmHg echo, 2010, moderate TR, mild right ventricular enlargement  . Tachy-brady syndrome ([place])    s/p pacer 07/2009  . Thyroid nodule    non-neoplastic goiter  . Type II diabetes mellitus ([place])   . Venous insufficiency    Chronic    Assessment: 53 yoF with PMH AFib, DVT/PE on warfarin, tachy-brady syndrome s/p PPM, breast CA s/p mastectomy, chronic HFpEF, frequent UTIs, DM2, venous insufficiency, pulmonary HTN, stage IV CKD, dementia, and recent admission after a fall at home, presenting from the SNF. Pharmacy consulted to dose/monitor warfarin on admission.   PTA warfarin dose (information received  from RN at [facility] on 2/25 via phone) -Home dose: 2-3 mg PO daily depending on INR (no set dose) -PTA recent INR checks and dosing:  -2/21 INR 4.2. Warfarin 1.5 mg PO given  -2/23 INR 4.2. Warfarin held.  Baseline labs:  -INR 2.8 therapeutic on admission -Hgb 11.4, Plt 480  Today, 11/24/2018:  CBC: no new labs today  INR (2.4) remains therapeutic  No major drug interactions.  No bleeding/bruising issues per nursing.  Heart healthy diet ordered. Pt eating very little.   Goal of Therapy: INR 2-3  Plan:  Warfarin 1.5 mg PO this evening. Dose reduce given poor PO intake and recent elevated INR.  Daily INR  CBC at least q72 hr while on warfarin  Monitor for signs of bleeding or thrombosis  Lenis Noon, PharmD 11/24/18 11:15 AM

## 2018-11-24 NOTE — Clinical Social Work Note (Signed)
Clinical Social Work Assessment  Patient Details  Name: [name] [name] MRN: [id number] Date of Birth: 07-22-1931  Date of referral:  11/24/18               Reason for consult:  Discharge Planning                Permission sought to share information with:  Family Supports Permission granted to share information::  Yes, Verbal Permission Granted  Name::     [name]  Agency::  [facility]  Relationship::  daughter  Contact Information:  [phone]  Housing/Transportation Living arrangements for the past 2 months:  [place] of Information:  Patient Patient Interpreter Needed:  None Criminal Activity/Legal Involvement Pertinent to Current Situation/Hospitalization:  No - Comment as needed Significant Relationships:  Adult Children, Community Support Lives with:  Facility Resident Do you feel safe going back to the place where you live?  Yes Need for family participation in patient care:  Yes (Comment)  Care giving concerns: CSW met patient at bedside with daughter [name] present. Patient has dementia and is not cognitive x 4. CSW did assessment with patients daughter   Facilities manager / plan:  CSW spoke with patient daughter at bedside. [name] stated patient is coming from [facility] and will return once medically stable. [name] stated that patient is in [facility] term section and will return. Family gave CSW permission to reach out to facility to make them aware that patient twill return back  Employment status:  Retired Nurse, adult PT Recommendations:  Not assessed at this time Information / Referral to community resources:  [place]  Patient/Family's Response to care:  Family supportive of patient and is aware of patient medical limitations   Patient/Family's Understanding of and Emotional Response to Diagnosis, Current Treatment, and Prognosis:  Family wanting patient to go back to [employer] Assessment Appearance:  Appears stated age Attitude/Demeanor/Rapport:  Unable to Assess(pt sleeping during assessment) Affect (typically observed):  Unable to Assess Orientation:  Oriented to Self Alcohol / Substance use:    Psych involvement (Current and /or in the community):  No (Comment)  Discharge Needs  Concerns to be addressed:  Care Coordination Readmission within the last 30 days:  No Current discharge risk:  Dependent with Mobility Barriers to Discharge:  Continued Medical Work up   [employer], LCSW 11/24/2018, 10:10 AM

## 2018-11-24 NOTE — Progress Notes (Signed)
Initial Nutrition Assessment  DOCUMENTATION CODES:   (will assess for malnutrition at follow-up)  INTERVENTION:  - Will order Ensure Enlive po BID, each supplement provides 350 kcal and 20 grams of protein - Will order daily multivitamin with minerals. - Continue to encourage PO intakes.    NUTRITION DIAGNOSIS:   Inadequate oral intake related to lethargy/confusion, acute illness as evidenced by meal completion < 25%.  GOAL:   Patient will meet greater than or equal to 90% of their needs  MONITOR:   PO intake, Supplement acceptance, Weight trends, Labs  REASON FOR ASSESSMENT:   Malnutrition Screening Tool  ASSESSMENT:   85 y.o. female with a history of AFib, DVT/PE on coumadin, tachy-brady syndrome s/p PPM, breast CA s/p mastectomy, CHF, frequent UTIs, Type 2 DM, venous insufficiency, pulmonary HTN, stage IV CKD, dementia, and recent admission after a fall at home. She presented to the ED on 2/24 from SNF with a report of generalized weakness, low serum K, and poor kidney function on labs. No report of decreased po intake, though the daughter feels patient is not being fed or offered water very vigilantly. No fevers, vomiting, or diarrhea.    BMI indicates overweight status/borderline obesity; appropriate for advanced age. No intakes documented since admission. Patient sleeping at the time of RD visit with no family/visitors present. Patient briefly fluttered eyelids after third name call, but then shut eyelids completely and did not respond to further name calls or questions. Spoke with tech who reports that patient took only bites of breakfast.  Per chart review, current weight is 219 lb and weight on 1/28 was 226 lb. This indicates 7 lb weight loss (3.1% body weight) in the past 1 month; not significant for time frame.   Per Dr. Hadley Pen note/H&P yesterday evening: AKI on CKD, UTI without sepsis, non-insulin dependent Type 2 DM with peripheral neuropathy, permanent afib. Patient  is an Obs patient and plan is for her to return to [facility] once renal function has improved.     Medications reviewed; sliding scale novolog, 40 mEq K-Dur x1 dose 2/25. Labs reviewed; CBGs: 146 and 175 mg/dl today, K: 3.1 mmol/l, BUN: 66 mg/dl, creatinine: 2.46 mg/dl, Ca: 8.5 mg/dl, GFR: 17 ml/min. IVF; NS @ 75 ml/hr.      NUTRITION - FOCUSED PHYSICAL EXAM:  Will attempt at follow-up.   Diet Order:   Diet Order            Diet heart healthy/carb modified Room service appropriate? Yes; Fluid consistency: Thin  Diet effective now              EDUCATION NEEDS:   Not appropriate for education at this time  Skin:  Skin Assessment: Reviewed RN Assessment  Last BM:  2/24  Height:   Ht Readings from Last 1 Encounters:  11/24/18 6' (1.829 m)    Weight:   Wt Readings from Last 1 Encounters:  11/24/18 99.3 kg    Ideal Body Weight:  80.91 kg  BMI:  Body mass index is 29.69 kg/m.  Estimated Nutritional Needs:   Kcal:  1490-1690 kcal  Protein:  50-60 grams  Fluid:  >/= 1.5 L/day     Jarome Matin, MS, RD, LDN, [facility] Inpatient Clinical Dietitian Pager # [phone] After hours/weekend pager # [phone]

## 2018-11-24 NOTE — Progress Notes (Signed)
PROGRESS NOTE  [name] [name] KCM:[id number] DOB: Mar 09, 1931 DOA: 11/23/2018 PCP: Hoyt Koch, MD  Brief History   85 year old woman PMH including atrial fibrillation, DVT/PE treated with warfarin, pacemaker placement, diabetes mellitus type 2, dementia presented with generalized weakness, hypokalemia and acute kidney injury.  Patient currently at SNF with plan to transition to long-term care there.  Daughter suspected patient not eating well.  Admitted for AKI, UTI  A & P  AKI superimposed on CKD stage III --Presumably secondary to poor oral intake secondary to dementia --Renal function without significant improvement, continue IV fluids, recheck BMP in a.m. --Check renal ultrasound if no improvement in a.m.  UTI --Continue empiric antibiotics, favor no more than 3-day course  Hypokalemia --Replete  Chronic systolic CHF LVEF 05%. --Monitor volume status closely, strict I/O --Consider discontinuation of diltiazem as an outpatient, defer to PCP --Continue Toprol-XL --Torsemide on hold  Diabetes mellitus type 2 --CBG stable.  Continue sliding scale insulin.  PMH DVT, PE --Continue warfarin per pharmacy  Atrial fibrillation --Continue warfarin  Dementia with FTT -- MOST form at bedside--full code, high risk of rehospitalization, seen by palliative medicine 1/24 --Continue Namenda  DVT prophylaxis: warfarin Code Status: Full Family Communication: daughter at bedside Disposition Plan: return to SNF > LTC    Murray Hodgkins, MD  [facility] Direct contact: see [email or web address]  7PM-7AM contact night coverage as above 11/24/2018, 3:49 PM  LOS: 0 days   Consultants  .   Procedures  .   Antibiotics  . Ceftriaxone 2/24 >  Interval History/Subjective  Feels okay.  No pain, nausea or vomiting.  No complaints.  Not clear history is reliable.  Daughter at bedside.  Objective   Vitals:  Vitals:   11/24/18 0925 11/24/18 1323  BP:  121/80  Pulse: (!)  103 74  Resp:  18  Temp:  98.1 F (36.7 C)  SpO2:  98%    Exam:  Constitutional:  . Appears calm and comfortable Eyes:  . pupils and irises appear normal . Normal lids ENMT:  . grossly normal hearing  . Lips appear normal . Tongue appears unremarkable Respiratory:  . CTA bilaterally, no w/r/r.  . Respiratory effort normal.  Cardiovascular:  . RRR, no m/r/g . No LE extremity edema   Abdomen:  . Soft, nontender, nondistended Musculoskeletal:  . RUE, LUE, RLE, LLE   . Grossly normal tone and strength Psychiatric:  . Mental status o Mood, affect appropriate  I have personally reviewed the following:   Today's Data  . Urine output incompletely recorded.  Potassium 3.1.  BUN without change, 66.  Creatinine without significant change, 2.46.  INR 2.4.  Lab Data  .   Micro Data  .   Imaging  .   Cardiology Data  . EKG showed atrial fibrillation with paced beats.  Other Data  .   Scheduled Meds: . bethanechol  25 mg Oral BID  . diltiazem  120 mg Oral Daily  . feeding supplement (ENSURE ENLIVE)  237 mL Oral BID BM  . insulin aspart  0-5 Units Subcutaneous QHS  . insulin aspart  0-9 Units Subcutaneous TID WC  . memantine  5 mg Oral BID  . metoprolol succinate  100 mg Oral Daily  . multivitamin  15 mL Oral Daily  . pantoprazole  40 mg Oral Daily  . pregabalin  75 mg Oral QHS  . warfarin  1.5 mg Oral ONCE-1800  . Warfarin - Pharmacist Dosing Inpatient   Does not apply [phone]  Continuous Infusions: . sodium chloride 75 mL/hr at 11/24/18 0945  . cefTRIAXone (ROCEPHIN)  IV      Principal Problem:   AKI (acute kidney injury) ([place]) Active Problems:   Acute lower UTI   Pulmonary HTN (HCC)   History of pulmonary embolism   History of breast cancer   Mitral regurgitation   Hypertension   Chronic diastolic (congestive) heart failure (HCC)   Diabetic peripheral neuropathy (HCC)   CKD (chronic kidney disease), stage III (HCC)   Diabetes mellitus type 2,  noninsulin dependent (HCC)   A-fib (HCC)   Weakness   Dementia without behavioral disturbance (HCC)   FTT (failure to thrive) in adult   LOS: 0 days

## 2018-11-25 DIAGNOSIS — R2689 Other abnormalities of gait and mobility: Secondary | ICD-10-CM | POA: Diagnosis not present

## 2018-11-25 DIAGNOSIS — M6281 Muscle weakness (generalized): Secondary | ICD-10-CM | POA: Diagnosis not present

## 2018-11-25 DIAGNOSIS — R41841 Cognitive communication deficit: Secondary | ICD-10-CM | POA: Diagnosis not present

## 2018-11-25 DIAGNOSIS — R1311 Dysphagia, oral phase: Secondary | ICD-10-CM | POA: Diagnosis not present

## 2018-11-25 DIAGNOSIS — N183 Chronic kidney disease, stage 3 (moderate): Secondary | ICD-10-CM | POA: Diagnosis not present

## 2018-11-25 DIAGNOSIS — I5023 Acute on chronic systolic (congestive) heart failure: Secondary | ICD-10-CM | POA: Diagnosis not present

## 2018-11-25 DIAGNOSIS — M6282 Rhabdomyolysis: Secondary | ICD-10-CM | POA: Diagnosis not present

## 2018-11-25 DIAGNOSIS — R2681 Unsteadiness on feet: Secondary | ICD-10-CM | POA: Diagnosis not present

## 2018-11-25 DIAGNOSIS — R1312 Dysphagia, oropharyngeal phase: Secondary | ICD-10-CM | POA: Diagnosis not present

## 2018-11-25 LAB — CBC
HCT: 37.3 % (ref 36.0–46.0)
Hemoglobin: 10.8 g/dL — ABNORMAL LOW (ref 12.0–15.0)
MCH: 26.5 pg (ref 26.0–34.0)
MCHC: 29 g/dL — ABNORMAL LOW (ref 30.0–36.0)
MCV: 91.6 fL (ref 80.0–100.0)
Platelets: 403 10*3/uL — ABNORMAL HIGH (ref 150–400)
RBC: 4.07 MIL/uL (ref 3.87–5.11)
RDW: 18.6 % — ABNORMAL HIGH (ref 11.5–15.5)
WBC: 6.4 10*3/uL (ref 4.0–10.5)
nRBC: 0 % (ref 0.0–0.2)

## 2018-11-25 LAB — GLUCOSE, CAPILLARY
Glucose-Capillary: 128 mg/dL — ABNORMAL HIGH (ref 70–99)
Glucose-Capillary: 202 mg/dL — ABNORMAL HIGH (ref 70–99)

## 2018-11-25 LAB — BASIC METABOLIC PANEL
Anion gap: 10 (ref 5–15)
BUN: 62 mg/dL — ABNORMAL HIGH (ref 8–23)
CO2: 23 mmol/L (ref 22–32)
Calcium: 8.6 mg/dL — ABNORMAL LOW (ref 8.9–10.3)
Chloride: 108 mmol/L (ref 98–111)
Creatinine, Ser: 2.36 mg/dL — ABNORMAL HIGH (ref 0.44–1.00)
GFR calc Af Amer: 21 mL/min — ABNORMAL LOW (ref >60)
GFR calc non Af Amer: 18 mL/min — ABNORMAL LOW (ref >60)
Glucose, Bld: 151 mg/dL — ABNORMAL HIGH (ref 70–99)
Potassium: 3.6 mmol/L (ref 3.5–5.1)
Sodium: 141 mmol/L (ref 135–145)

## 2018-11-25 LAB — URINE CULTURE

## 2018-11-25 LAB — PROTIME-INR
INR: 2.5 — ABNORMAL HIGH (ref 0.8–1.2)
PROTHROMBIN TIME: 26.6 s — AB (ref 11.4–15.2)

## 2018-11-25 MED ORDER — INSULIN ASPART 100 UNIT/ML ~~LOC~~ SOLN: 0.0000 [IU] | Freq: Three times a day (TID) | SUBCUTANEOUS | 11 refills | Status: AC

## 2018-11-25 MED ORDER — WARFARIN SODIUM 1 MG PO TABS
1.5000 mg | ORAL_TABLET | Freq: Once | ORAL | Status: DC
  Filled 2018-11-25: qty 1

## 2018-11-25 MED ORDER — INSULIN ASPART 100 UNIT/ML ~~LOC~~ SOLN: 0.0000 [IU] | Freq: Every day | SUBCUTANEOUS | 11 refills | Status: DC

## 2018-11-25 NOTE — Clinical Social Work Placement (Signed)
   CLINICAL SOCIAL WORK PLACEMENT  NOTE  Date:  11/25/2018  Patient Details  Name: [name] [name] MRN: [id number] Date of Birth: 1931/05/09  Clinical Social Work is seeking post-discharge placement for this patient at the [place] level of care (*CSW will initial, date and re-position this form in  chart as items are completed):  Yes   Patient/family provided with [place] Work Department's list of facilities offering this level of care within the geographic area requested by the patient (or if unable, by the patient's family).  Yes   Patient/family informed of their freedom to choose among providers that offer the needed level of care, that participate in Medicare, Medicaid or managed care program needed by the patient, have an available bed and are willing to accept the patient.  Yes   Patient/family informed of [facility]'s ownership interest in [facility] and [facility], as well as of the fact that they are under no obligation to receive care at these facilities.  PASRR submitted to EDS on       PASRR number received on       Existing PASRR number confirmed on 11/25/18     FL2 transmitted to all facilities in geographic area requested by pt/family on 11/25/18     FL2 transmitted to all facilities within larger geographic area on       Patient informed that his/her managed care company has contracts with or will negotiate with certain facilities, including the following:            Patient/family informed of bed offers received.  Patient chooses bed at [facility]     Physician recommends and patient chooses bed at      Patient to be transferred to [facility] on 11/25/18.  Patient to be transferred to facility by ptar     Patient family notified on 11/25/18 of transfer.  Name of family member notified:  spoke with daughter via phone     PHYSICIAN       Additional Comment:     _______________________________________________ Wende Neighbors, LCSW 11/25/2018, 1:49 PM

## 2018-11-25 NOTE — Progress Notes (Signed)
Clinical Social Worker facilitated patient discharge including contacting patient family and facility to confirm patient discharge plans.  Clinical information faxed to facility and family agreeable with plan.  CSW arranged ambulance transport via [facility] to [facility] .  RN to call [phone] ([phone]) for  report prior to discharge.  Clinical Social Worker will sign off for now as social work intervention is no longer needed. Please consult [place] again if new need arises.  Rhea Pink, MSW, [place]

## 2018-11-25 NOTE — Progress Notes (Addendum)
[place] for warfarin Indication: atrial fibrillation and history of VTE  Allergies  Allergen Reactions  . Amiodarone Shortness Of Breath and Nausea Only    "Toxicity," also  . Flecainide Shortness Of Breath and Other (See Comments)    "Toxicity," also   . Morphine And Related Nausea And Vomiting and Other (See Comments)    Family requests no morphine due to past reaction. Pt stated it made her sick, severe nausea and vomiting  . Bactrim Ds [Sulfamethoxazole-Trimethoprim] Diarrhea  . Cephalexin Nausea And Vomiting  . Pineapple Itching    Severe itching    Patient Measurements: Height: 6' (182.9 cm) Weight: 218 lb 14.7 oz (99.3 kg) IBW/kg (Calculated) : 73.1  Vital Signs: Temp: 98.2 F (36.8 C) (02/26 0531) Temp Source: Oral (02/26 0531) BP: 121/74 (02/26 0531) Pulse Rate: 84 (02/26 0531)  Labs: Recent Labs    11/23/18 1508 11/23/18 1516 11/24/18 0538 11/25/18 0615  HGB 11.4*  --   --  10.8*  HCT 38.3  --   --  37.3  PLT 480*  --   --  403*  LABPROT  --  29.4* 25.6* 26.6*  INR  --  2.8 2.4* 2.5*  CREATININE 2.52*  --  2.46* 2.36*    Estimated Creatinine Clearance: 22.6 mL/min (A) (by C-G formula based on SCr of 2.36 mg/dL (H)).   Medical History: Past Medical History:  Diagnosis Date  . Acute gastric ulcer without mention of hemorrhage, perforation, or obstruction 2006   EGD  . Anxiety   . Atrial fibrillation (HCC)    flecainide; coumadin  . Breast cancer ([place])    left  . Carotid artery disease ([place])    24/23: RICA 5-36%, LICA 14-43%  //  Carotiud [place] 15/40: RICA 0-86%; LICA 76-19% >> FU 1 year   . Chronic diastolic heart failure (HCC)    a. echo 10/10: mild LVH, EF 55-60%, mild AI, mild MR, mild to mod LAE, mild RVE, severe RAE, mod TR, PASP 53  /  b.  Echo 3/14:  Mild LVH, EF 55%, Tr AI, MAC, mild MR, mod LAE, PASP 31, trivial eff. // c. echo 1/17: EF 55-60%, mild AI, MAC, mild MR, moderate BAE, moderate TR  .  Chronic lower back pain   . Dementia without behavioral disturbance ([place]) 11/24/2018  . Diabetic peripheral neuropathy ([place]) 03/09/2015  . Diverticulosis   . Esophagitis, unspecified 2006   EGD  . Hiatal hernia 2006   EGD  . History of nuclear stress test    a. Myoview 2/12: EF 69%, no scar or ischemia  . Hx of adenomatous colonic polyps   . Hypertension   . Mediastinal lymphadenopathy   . Memory difficulty 11/14/2016  . Mitral regurgitation    mild, echo, October, 2010  . Osteopenia   . Presence of permanent cardiac pacemaker   . Pulmonary embolus [facility]) 2006   2006, with DVT  . Pulmonary HTN ([place])    53 mmHg echo, 2010, moderate TR, mild right ventricular enlargement  . Tachy-brady syndrome ([place])    s/p pacer 07/2009  . Thyroid nodule    non-neoplastic goiter  . Type II diabetes mellitus ([place])   . Venous insufficiency    Chronic    Assessment: 66 yoF with PMH AFib, DVT/PE on warfarin, tachy-brady syndrome s/p PPM, breast CA s/p mastectomy, chronic HFpEF, frequent UTIs, DM2, venous insufficiency, pulmonary HTN, stage IV CKD, dementia, and recent admission after a fall at home, presenting from  the SNF. Pharmacy consulted to dose/monitor warfarin on admission.   PTA warfarin dose (information received from RN at [facility] on 2/25 via phone) -Home dose: 2-3 mg PO daily depending on INR (no set dose) -PTA recent INR checks and dosing:  -2/21 INR 4.2. Warfarin 1.5 mg PO given  -2/23 INR 4.2. Warfarin held.  Baseline labs:  -INR 2.8 therapeutic on admission -Hgb 11.4, Plt 480  Today, 11/25/2018:  CBC: stable  INR (2.5) remains therapeutic  No major drug interactions  No bleeding/bruising issues noted  Heart healthy diet ordered. Pt eating very little (25% charted in past 24 hrs), which could contribute to pt being more sensitive to warfarin and requiring a lower dose.  Goal of Therapy: INR 2-3  Plan:  Warfarin 1.5 mg PO this evening. Will continue with reduced  dose given poor PO intake and recent elevated INR.  Daily INR  CBC at least q72 hr while on warfarin  Monitor for signs of bleeding or thrombosis  Lenis Noon, PharmD 11/25/18 7:52 AM  Addendum:  If patient discharges today, recommend discharging on warfarin 1.5 mg PO daily with next INR check scheduled for Friday Feb 28th and further dose adjustments based off of INR.   Lenis Noon, PharmD 11/25/18 9:51 AM

## 2018-11-25 NOTE — Discharge Summary (Addendum)
Physician Discharge Summary  [name] [name] NTZ:[id number] DOB: 12/12/1930 DOA: 11/23/2018  PCP: Hoyt Koch, MD  Admit date: 11/23/2018 Discharge date: 11/25/2018  Admitted From: Home Discharge disposition: SNF  Discharge Diagnosis:   Principal Problem:   AKI (acute kidney injury) ([place]) Active Problems:   Pulmonary HTN ([place])   History of pulmonary embolism   History of breast cancer   Mitral regurgitation   Hypertension   Chronic diastolic (congestive) heart failure (HCC)   Diabetic peripheral neuropathy (HCC)   CKD (chronic kidney disease), stage III ([place])   Diabetes mellitus type 2, noninsulin dependent ([place])   A-fib ([place])   Weakness   Dementia without behavioral disturbance ([place])   FTT (failure to thrive) in adult  Discharge Condition: Improved. Diet recommendation: cardiac, diabetic diet Wound care: none Code status: Full.  History of Present Illness / Brief narrative:   Patient is an 85 year old woman PMH including atrial fibrillation, DVT/PE treated with warfarin, pacemaker placement, diabetes mellitus type 2, dementia presented with generalized weakness, hypokalemia and acute kidney injury.  Patient was living in an SNF with plan to transition to long-term care there.  Daughter suspected patient not eating well.  She was brought into the hospital and was admitted for AKI, UTI on 11/23/2018.  Hospital Course:  AKI superimposed on CKD stage 4 --Baseline creatinine around 2.  Presented with creatinine of 2.52, gradually improving, 2.36 today.  - Presumably secondary to poor oral intake secondary to dementia  UTI --Nonsignificant polymicrobial protein urine culture.  Completed 3 days of IV Rocephin.  Hypokalemia --Repleted  Chronic systolic CHF LVEF 67%. --Monitor volume status closely, strict I/O.  Resume metoprolol and Lasix.  Diabetes mellitus type 2 --CBG stable.  Continue sliding scale insulin.  PMH DVT, PE --Continue warfarin per  pharmacy  Atrial fibrillation --Continue warfarin. Prior to admission, patient seems to be on Cardizem 120 mg as well as metoprolol succinate 100 mg daily.   Dementia with FTT -- MOST form at bedside--full code, high risk of rehospitalization, seen by palliative medicine 1/24 -- Continue Namenda  Disposition Plan: return to SNF > LTC   Medical Consultants:    None.   Discharge Exam:   Vitals:   11/24/18 2056 11/25/18 0531  BP: 120/74 121/74  Pulse: 78 84  Resp: 16 16  Temp: (!) 97.5 F (36.4 C) 98.2 F (36.8 C)  SpO2: 100% 95%   Vitals:   11/24/18 1323 11/24/18 2056 11/25/18 0531 11/25/18 0754  BP: 121/80 120/74 121/74   Pulse: 74 78 84   Resp: _0 Temp: 98.1 F (36.7 C) (!) 97.5 F (36.4 C) 98.2 F (36.8 C)   TempSrc: Oral Oral Oral   SpO2: 98% 100% 95%   Weight:    100.4 kg  Height:        General exam: Appears calm and comfortable.  Skin: No rashes, lesions or ulcers. Respiratory system: Clear to auscultation bilaterally Cardiovascular system: Regular rate and rhythm, no murmur Gastrointestinal system: Soft, nondistended, nontender, bowel sound present Central nervous system: Alert, awake, not oriented Psychiatry: Mood & affect appropriate.  Extremities: No pedal edema, no calf tenderness   The results of significant diagnostics from this hospitalization (including imaging, microbiology, ancillary and laboratory) are listed below for reference.    Procedures and Diagnostic Studies:   No results found.   Labs:   Basic Metabolic Panel: Recent Labs  Lab 11/23/18 1508 11/24/18 0538 11/25/18 0615  NA 138 139 141  K 3.5 3.1*  3.6  CL 99 103 108  CO2 _0 GLUCOSE 187* 159* 151*  BUN 66* 66* 62*  CREATININE 2.52* 2.46* 2.36*  CALCIUM 8.8* 8.5* 8.6*   GFR Estimated Creatinine Clearance: 22.7 mL/min (A) (by C-G formula based on SCr of 2.36 mg/dL (H)). Liver Function Tests: No results for input(s): AST, ALT, ALKPHOS, BILITOT,  PROT, ALBUMIN in the last 168 hours. No results for input(s): LIPASE, AMYLASE in the last 168 hours. No results for input(s): AMMONIA in the last 168 hours. Coagulation profile Recent Labs  Lab 11/23/18 1516 11/24/18 0538 11/25/18 0615  INR 2.8 2.4* 2.5*    CBC: Recent Labs  Lab 11/23/18 1508 11/25/18 0615  WBC 8.0 6.4  HGB 11.4* 10.8*  HCT 38.3 37.3  MCV 89.7 91.6  PLT 480* 403*   Cardiac Enzymes: No results for input(s): CKTOTAL, CKMB, CKMBINDEX, TROPONINI in the last 168 hours. BNP: Invalid input(s): POCBNP CBG: Recent Labs  Lab 11/24/18 0759 11/24/18 1130 11/24/18 1607 11/24/18 2058 11/25/18 0740  GLUCAP 146* 175* 133* 133* 128*   D-Dimer No results for input(s): DDIMER in the last 72 hours. Hgb A1c No results for input(s): HGBA1C in the last 72 hours. Lipid Profile No results for input(s): CHOL, HDL, LDLCALC, TRIG, CHOLHDL, LDLDIRECT in the last 72 hours. Thyroid function studies No results for input(s): TSH, T4TOTAL, T3FREE, THYROIDAB in the last 72 hours.  Invalid input(s): FREET3 Anemia work up No results for input(s): VITAMINB12, FOLATE, FERRITIN, TIBC, IRON, RETICCTPCT in the last 72 hours. Microbiology Recent Results (from the past 240 hour(s))  Urine culture     Status: Abnormal   Collection Time: 11/23/18  3:02 PM  Result Value Ref Range Status   Specimen Description   Final    URINE, RANDOM Performed at [facility] [address] [place], [place] [address]    Special Requests   Final    NONE Performed at [facility], [place] [address], [place], [place] [address]    Culture MULTIPLE SPECIES PRESENT, SUGGEST RECOLLECTION (A)  Final   Report Status 11/25/2018 FINAL  Final  MRSA PCR Screening     Status: None   Collection Time: 11/24/18  4:13 AM  Result Value Ref Range Status   MRSA by PCR NEGATIVE NEGATIVE Final    Comment:        The GeneXpert MRSA Assay (FDA approved for NASAL specimens only),  is one component of a comprehensive MRSA colonization surveillance program. It is not intended to diagnose MRSA infection nor to guide or monitor treatment for MRSA infections. Performed at [facility], [place] [address], [place], [place] [address]      Discharge Instructions:   Discharge Instructions    Diet - low sodium heart healthy   Complete by:  As directed    Increase activity slowly   Complete by:  As directed      Allergies as of 11/25/2018      Reactions   Amiodarone Shortness Of Breath, Nausea Only   "Toxicity," also   Flecainide Shortness Of Breath, Other (See Comments)   "Toxicity," also   Morphine And Related Nausea And Vomiting, Other (See Comments)   Family requests no morphine due to past reaction. Pt stated it made her sick, severe nausea and vomiting   Bactrim Ds [sulfamethoxazole-trimethoprim] Diarrhea   Cephalexin Nausea And Vomiting   Pineapple Itching   Severe itching      Medication List    STOP taking  these medications   torsemide 20 MG tablet Commonly known as:  DEMADEX     TAKE these medications   acetaminophen 325 MG tablet Commonly known as:  TYLENOL Take 650 mg by mouth every 6 (six) hours as needed (back pain).   bethanechol 25 MG tablet Commonly known as:  URECHOLINE Take 25 mg by mouth 2 (two) times daily.   blood glucose meter kit and supplies Kit Dispense based on patient and insurance preference. Use before breakfast and at bedtime. Diagnosis code: E11.9   diltiazem 120 MG 24 hr capsule Commonly known as:  CARDIZEM CD Take 1 capsule (120 mg total) by mouth daily.   dimethicone 1 % cream Apply topically 2 (two) times daily as needed for dry skin.   feeding supplement (ENSURE ENLIVE) Liqd Take 237 mLs by mouth 2 (two) times daily between meals.   furosemide 80 MG tablet Commonly known as:  LASIX TAKE 1 TABLET (80 MG TOTAL) BY MOUTH 2 (TWO) TIMES DAILY.   insulin aspart 100 UNIT/ML  injection Commonly known as:  novoLOG Inject 0-9 Units into the skin 3 (three) times daily with meals.   liver oil-zinc oxide 40 % ointment Commonly known as:  DESITIN Apply topically 6 (six) times daily. Apply to buttocks, labia, inner upper thighs, upper posterior thighs   memantine 5 MG tablet Commonly known as:  NAMENDA Take 1 tablet (5 mg total) by mouth 2 (two) times daily.   metoprolol succinate 100 MG 24 hr tablet Commonly known as:  TOPROL-XL Take 1 tablet (100 mg total) by mouth daily. Take with or immediately following a meal.   multivitamin with minerals Tabs tablet Take 1 tablet by mouth daily.   nystatin cream Commonly known as:  MYCOSTATIN Apply topically 2 (two) times daily. Apply to bilateral groins   omeprazole 20 MG capsule Commonly known as:  PRILOSEC Take 20 mg by mouth daily.   ondansetron 4 MG tablet Commonly known as:  ZOFRAN Take 4 mg by mouth every 6 (six) hours as needed for nausea or vomiting.   polyethylene glycol packet Commonly known as:  MIRALAX / GLYCOLAX Take 17 g by mouth daily as needed for mild constipation.   potassium chloride SA 20 MEQ tablet Commonly known as:  K-DUR,KLOR-CON Take 20 mEq by mouth daily.   pregabalin 75 MG capsule Commonly known as:  LYRICA Take 1 capsule (75 mg total) by mouth at bedtime.   promethazine 25 MG tablet Commonly known as:  PHENERGAN Take 1 tablet (25 mg total) by mouth every 8 (eight) hours as needed for nausea or vomiting.   senna-docusate 8.6-50 MG tablet Commonly known as:  Senokot-S Take 1 tablet by mouth at bedtime as needed for mild constipation.   vitamin B-12 500 MCG tablet Commonly known as:  CYANOCOBALAMIN Take 1,000 mcg by mouth daily.   warfarin 2.5 MG tablet Commonly known as:  COUMADIN Take as directed. If you are unsure how to take this medication, talk to your nurse or doctor. Original instructions:  TAKE 1/2 TABLET TO 1 TABLET DAILY OR AS DIRECTED BY COUMADIN CLINIC What  changed:  See the new instructions.         Time coordinating discharge: 39 minutes  Signed:  Chrislynn Mosely  [facility] 11/25/2018, 11:45 AM

## 2018-11-26 ENCOUNTER — Telehealth: Payer: Self-pay | Admitting: *Deleted

## 2018-11-26 DIAGNOSIS — I2782 Chronic pulmonary embolism: Secondary | ICD-10-CM | POA: Diagnosis not present

## 2018-11-26 DIAGNOSIS — I480 Paroxysmal atrial fibrillation: Secondary | ICD-10-CM | POA: Diagnosis not present

## 2018-11-26 DIAGNOSIS — N184 Chronic kidney disease, stage 4 (severe): Secondary | ICD-10-CM | POA: Diagnosis not present

## 2018-11-26 DIAGNOSIS — N178 Other acute kidney failure: Secondary | ICD-10-CM | POA: Diagnosis not present

## 2018-11-26 DIAGNOSIS — I5042 Chronic combined systolic (congestive) and diastolic (congestive) heart failure: Secondary | ICD-10-CM | POA: Diagnosis not present

## 2018-11-26 DIAGNOSIS — E119 Type 2 diabetes mellitus without complications: Secondary | ICD-10-CM | POA: Diagnosis not present

## 2018-11-26 DIAGNOSIS — R0989 Other specified symptoms and signs involving the circulatory and respiratory systems: Secondary | ICD-10-CM | POA: Diagnosis not present

## 2018-11-26 DIAGNOSIS — R69 Illness, unspecified: Secondary | ICD-10-CM | POA: Diagnosis not present

## 2018-11-26 NOTE — Telephone Encounter (Signed)
Pt was on TCM report she was brought to the hospital and was admitted for AKI, UTI on 11/23/2018. Patient was living in an SNF with plan to transition to long-term care there. Daughter suspected patient not eating well. Pt UTI was  Nonsignificant polymicrobial protein urine culture.  She Completed 3 days of IV Rocephin, and was D/C 11/25/18 and sent back to SNF.Marland KitchenJohny Chess

## 2018-11-30 DIAGNOSIS — R2681 Unsteadiness on feet: Secondary | ICD-10-CM | POA: Diagnosis not present

## 2018-11-30 DIAGNOSIS — I5042 Chronic combined systolic (congestive) and diastolic (congestive) heart failure: Secondary | ICD-10-CM | POA: Diagnosis not present

## 2018-11-30 DIAGNOSIS — N184 Chronic kidney disease, stage 4 (severe): Secondary | ICD-10-CM | POA: Diagnosis not present

## 2018-11-30 DIAGNOSIS — R2689 Other abnormalities of gait and mobility: Secondary | ICD-10-CM | POA: Diagnosis not present

## 2018-11-30 DIAGNOSIS — I480 Paroxysmal atrial fibrillation: Secondary | ICD-10-CM | POA: Diagnosis not present

## 2018-11-30 DIAGNOSIS — R1311 Dysphagia, oral phase: Secondary | ICD-10-CM | POA: Diagnosis not present

## 2018-11-30 DIAGNOSIS — M6282 Rhabdomyolysis: Secondary | ICD-10-CM | POA: Diagnosis not present

## 2018-11-30 DIAGNOSIS — N183 Chronic kidney disease, stage 3 (moderate): Secondary | ICD-10-CM | POA: Diagnosis not present

## 2018-11-30 DIAGNOSIS — M6281 Muscle weakness (generalized): Secondary | ICD-10-CM | POA: Diagnosis not present

## 2018-11-30 DIAGNOSIS — R41841 Cognitive communication deficit: Secondary | ICD-10-CM | POA: Diagnosis not present

## 2018-11-30 DIAGNOSIS — I5023 Acute on chronic systolic (congestive) heart failure: Secondary | ICD-10-CM | POA: Diagnosis not present

## 2018-11-30 DIAGNOSIS — R1312 Dysphagia, oropharyngeal phase: Secondary | ICD-10-CM | POA: Diagnosis not present

## 2018-12-01 DIAGNOSIS — M6281 Muscle weakness (generalized): Secondary | ICD-10-CM | POA: Diagnosis not present

## 2018-12-01 DIAGNOSIS — M6282 Rhabdomyolysis: Secondary | ICD-10-CM | POA: Diagnosis not present

## 2018-12-01 DIAGNOSIS — R1311 Dysphagia, oral phase: Secondary | ICD-10-CM | POA: Diagnosis not present

## 2018-12-01 DIAGNOSIS — N183 Chronic kidney disease, stage 3 (moderate): Secondary | ICD-10-CM | POA: Diagnosis not present

## 2018-12-01 DIAGNOSIS — I5023 Acute on chronic systolic (congestive) heart failure: Secondary | ICD-10-CM | POA: Diagnosis not present

## 2018-12-01 DIAGNOSIS — D649 Anemia, unspecified: Secondary | ICD-10-CM | POA: Diagnosis not present

## 2018-12-01 DIAGNOSIS — Z79899 Other long term (current) drug therapy: Secondary | ICD-10-CM | POA: Diagnosis not present

## 2018-12-01 DIAGNOSIS — R41841 Cognitive communication deficit: Secondary | ICD-10-CM | POA: Diagnosis not present

## 2018-12-01 DIAGNOSIS — R2689 Other abnormalities of gait and mobility: Secondary | ICD-10-CM | POA: Diagnosis not present

## 2018-12-01 DIAGNOSIS — R2681 Unsteadiness on feet: Secondary | ICD-10-CM | POA: Diagnosis not present

## 2018-12-01 DIAGNOSIS — R1312 Dysphagia, oropharyngeal phase: Secondary | ICD-10-CM | POA: Diagnosis not present

## 2018-12-02 ENCOUNTER — Telehealth: Payer: Self-pay

## 2018-12-02 ENCOUNTER — Ambulatory Visit (INDEPENDENT_AMBULATORY_CARE_PROVIDER_SITE_OTHER): Payer: Medicare HMO | Admitting: *Deleted

## 2018-12-02 DIAGNOSIS — R791 Abnormal coagulation profile: Secondary | ICD-10-CM | POA: Diagnosis not present

## 2018-12-02 DIAGNOSIS — I495 Sick sinus syndrome: Secondary | ICD-10-CM | POA: Diagnosis not present

## 2018-12-02 DIAGNOSIS — N178 Other acute kidney failure: Secondary | ICD-10-CM | POA: Diagnosis not present

## 2018-12-02 DIAGNOSIS — M6281 Muscle weakness (generalized): Secondary | ICD-10-CM | POA: Diagnosis not present

## 2018-12-02 DIAGNOSIS — I5023 Acute on chronic systolic (congestive) heart failure: Secondary | ICD-10-CM | POA: Diagnosis not present

## 2018-12-02 DIAGNOSIS — N183 Chronic kidney disease, stage 3 (moderate): Secondary | ICD-10-CM | POA: Diagnosis not present

## 2018-12-02 DIAGNOSIS — R2681 Unsteadiness on feet: Secondary | ICD-10-CM | POA: Diagnosis not present

## 2018-12-02 DIAGNOSIS — R1311 Dysphagia, oral phase: Secondary | ICD-10-CM | POA: Diagnosis not present

## 2018-12-02 DIAGNOSIS — M6282 Rhabdomyolysis: Secondary | ICD-10-CM | POA: Diagnosis not present

## 2018-12-02 DIAGNOSIS — R1312 Dysphagia, oropharyngeal phase: Secondary | ICD-10-CM | POA: Diagnosis not present

## 2018-12-02 DIAGNOSIS — Z7901 Long term (current) use of anticoagulants: Secondary | ICD-10-CM | POA: Diagnosis not present

## 2018-12-02 DIAGNOSIS — I5041 Acute combined systolic (congestive) and diastolic (congestive) heart failure: Secondary | ICD-10-CM | POA: Diagnosis not present

## 2018-12-02 DIAGNOSIS — R2689 Other abnormalities of gait and mobility: Secondary | ICD-10-CM | POA: Diagnosis not present

## 2018-12-02 DIAGNOSIS — R41841 Cognitive communication deficit: Secondary | ICD-10-CM | POA: Diagnosis not present

## 2018-12-02 NOTE — Telephone Encounter (Signed)
Copied from [place] [phone]. Topic: General - Other >> Dec 02, 2018  8:55 AM Carolyn Stare wrote:  [name] pt daughter call to say pt is [facility] long term and want it placed on pt chart

## 2018-12-02 NOTE — Telephone Encounter (Signed)
Copied from [place] [phone]. Topic: General - Other >> Dec 02, 2018  8:55 AM Carolyn Stare wrote:  [name] [name] pt daughter call to say pt is [name] long term and want it placed on pt chart

## 2018-12-03 DIAGNOSIS — R2681 Unsteadiness on feet: Secondary | ICD-10-CM | POA: Diagnosis not present

## 2018-12-03 DIAGNOSIS — N183 Chronic kidney disease, stage 3 (moderate): Secondary | ICD-10-CM | POA: Diagnosis not present

## 2018-12-03 DIAGNOSIS — R2689 Other abnormalities of gait and mobility: Secondary | ICD-10-CM | POA: Diagnosis not present

## 2018-12-03 DIAGNOSIS — I5023 Acute on chronic systolic (congestive) heart failure: Secondary | ICD-10-CM | POA: Diagnosis not present

## 2018-12-03 DIAGNOSIS — M6282 Rhabdomyolysis: Secondary | ICD-10-CM | POA: Diagnosis not present

## 2018-12-03 DIAGNOSIS — R1311 Dysphagia, oral phase: Secondary | ICD-10-CM | POA: Diagnosis not present

## 2018-12-03 DIAGNOSIS — Z471 Aftercare following joint replacement surgery: Secondary | ICD-10-CM | POA: Diagnosis not present

## 2018-12-03 DIAGNOSIS — R1312 Dysphagia, oropharyngeal phase: Secondary | ICD-10-CM | POA: Diagnosis not present

## 2018-12-03 DIAGNOSIS — D649 Anemia, unspecified: Secondary | ICD-10-CM | POA: Diagnosis not present

## 2018-12-03 DIAGNOSIS — R41841 Cognitive communication deficit: Secondary | ICD-10-CM | POA: Diagnosis not present

## 2018-12-03 DIAGNOSIS — M6281 Muscle weakness (generalized): Secondary | ICD-10-CM | POA: Diagnosis not present

## 2018-12-03 DIAGNOSIS — Z79899 Other long term (current) drug therapy: Secondary | ICD-10-CM | POA: Diagnosis not present

## 2018-12-03 DIAGNOSIS — I10 Essential (primary) hypertension: Secondary | ICD-10-CM | POA: Diagnosis not present

## 2018-12-03 LAB — CUP PACEART REMOTE DEVICE CHECK
Battery Impedance: 1734 Ohm
Battery Remaining Longevity: 38 mo
Battery Voltage: 2.75 V
Brady Statistic AP VP Percent: 2 %
Brady Statistic AS VS Percent: 96 %
Date Time Interrogation Session: 20200304193401
Implantable Lead Implant Date: 20101001
Implantable Lead Implant Date: 20101001
Implantable Lead Location: 753859
Implantable Lead Location: 753860
Implantable Lead Model: 5076
Implantable Pulse Generator Implant Date: 20101001
Lead Channel Impedance Value: 373 Ohm
Lead Channel Pacing Threshold Amplitude: 0.5 V
Lead Channel Pacing Threshold Pulse Width: 0.4 ms
Lead Channel Pacing Threshold Pulse Width: 0.4 ms
Lead Channel Setting Pacing Amplitude: 2 V
Lead Channel Setting Pacing Amplitude: 2.5 V
Lead Channel Setting Pacing Pulse Width: 1 ms
Lead Channel Setting Sensing Sensitivity: 2.8 mV
MDC IDC MSMT LEADCHNL RA IMPEDANCE VALUE: 380 Ohm
MDC IDC MSMT LEADCHNL RV PACING THRESHOLD AMPLITUDE: 1.25 V
MDC IDC STAT BRADY AP VS PERCENT: 1 %
MDC IDC STAT BRADY AS VP PERCENT: 1 %

## 2018-12-04 DIAGNOSIS — R1312 Dysphagia, oropharyngeal phase: Secondary | ICD-10-CM | POA: Diagnosis not present

## 2018-12-04 DIAGNOSIS — M6281 Muscle weakness (generalized): Secondary | ICD-10-CM | POA: Diagnosis not present

## 2018-12-04 DIAGNOSIS — I5023 Acute on chronic systolic (congestive) heart failure: Secondary | ICD-10-CM | POA: Diagnosis not present

## 2018-12-04 DIAGNOSIS — R1311 Dysphagia, oral phase: Secondary | ICD-10-CM | POA: Diagnosis not present

## 2018-12-04 DIAGNOSIS — N183 Chronic kidney disease, stage 3 (moderate): Secondary | ICD-10-CM | POA: Diagnosis not present

## 2018-12-04 DIAGNOSIS — M6282 Rhabdomyolysis: Secondary | ICD-10-CM | POA: Diagnosis not present

## 2018-12-04 DIAGNOSIS — R41841 Cognitive communication deficit: Secondary | ICD-10-CM | POA: Diagnosis not present

## 2018-12-04 DIAGNOSIS — R2681 Unsteadiness on feet: Secondary | ICD-10-CM | POA: Diagnosis not present

## 2018-12-04 DIAGNOSIS — R2689 Other abnormalities of gait and mobility: Secondary | ICD-10-CM | POA: Diagnosis not present

## 2018-12-05 DIAGNOSIS — R2689 Other abnormalities of gait and mobility: Secondary | ICD-10-CM | POA: Diagnosis not present

## 2018-12-05 DIAGNOSIS — R1312 Dysphagia, oropharyngeal phase: Secondary | ICD-10-CM | POA: Diagnosis not present

## 2018-12-05 DIAGNOSIS — I5023 Acute on chronic systolic (congestive) heart failure: Secondary | ICD-10-CM | POA: Diagnosis not present

## 2018-12-05 DIAGNOSIS — R41841 Cognitive communication deficit: Secondary | ICD-10-CM | POA: Diagnosis not present

## 2018-12-05 DIAGNOSIS — R1311 Dysphagia, oral phase: Secondary | ICD-10-CM | POA: Diagnosis not present

## 2018-12-05 DIAGNOSIS — M6281 Muscle weakness (generalized): Secondary | ICD-10-CM | POA: Diagnosis not present

## 2018-12-05 DIAGNOSIS — M6282 Rhabdomyolysis: Secondary | ICD-10-CM | POA: Diagnosis not present

## 2018-12-05 DIAGNOSIS — N183 Chronic kidney disease, stage 3 (moderate): Secondary | ICD-10-CM | POA: Diagnosis not present

## 2018-12-05 DIAGNOSIS — R2681 Unsteadiness on feet: Secondary | ICD-10-CM | POA: Diagnosis not present

## 2018-12-07 DIAGNOSIS — R748 Abnormal levels of other serum enzymes: Secondary | ICD-10-CM | POA: Diagnosis not present

## 2018-12-07 DIAGNOSIS — N178 Other acute kidney failure: Secondary | ICD-10-CM | POA: Diagnosis not present

## 2018-12-07 DIAGNOSIS — Z5181 Encounter for therapeutic drug level monitoring: Secondary | ICD-10-CM | POA: Diagnosis not present

## 2018-12-07 DIAGNOSIS — E876 Hypokalemia: Secondary | ICD-10-CM | POA: Diagnosis not present

## 2018-12-07 DIAGNOSIS — Z7901 Long term (current) use of anticoagulants: Secondary | ICD-10-CM | POA: Diagnosis not present

## 2018-12-07 DIAGNOSIS — E119 Type 2 diabetes mellitus without complications: Secondary | ICD-10-CM | POA: Diagnosis not present

## 2018-12-08 DIAGNOSIS — M6282 Rhabdomyolysis: Secondary | ICD-10-CM | POA: Diagnosis not present

## 2018-12-08 DIAGNOSIS — Z5181 Encounter for therapeutic drug level monitoring: Secondary | ICD-10-CM | POA: Diagnosis not present

## 2018-12-08 DIAGNOSIS — I5023 Acute on chronic systolic (congestive) heart failure: Secondary | ICD-10-CM | POA: Diagnosis not present

## 2018-12-08 DIAGNOSIS — N184 Chronic kidney disease, stage 4 (severe): Secondary | ICD-10-CM | POA: Diagnosis not present

## 2018-12-08 DIAGNOSIS — R2681 Unsteadiness on feet: Secondary | ICD-10-CM | POA: Diagnosis not present

## 2018-12-08 DIAGNOSIS — I480 Paroxysmal atrial fibrillation: Secondary | ICD-10-CM | POA: Diagnosis not present

## 2018-12-08 DIAGNOSIS — R1311 Dysphagia, oral phase: Secondary | ICD-10-CM | POA: Diagnosis not present

## 2018-12-08 DIAGNOSIS — R1312 Dysphagia, oropharyngeal phase: Secondary | ICD-10-CM | POA: Diagnosis not present

## 2018-12-08 DIAGNOSIS — R2689 Other abnormalities of gait and mobility: Secondary | ICD-10-CM | POA: Diagnosis not present

## 2018-12-08 DIAGNOSIS — I5042 Chronic combined systolic (congestive) and diastolic (congestive) heart failure: Secondary | ICD-10-CM | POA: Diagnosis not present

## 2018-12-08 DIAGNOSIS — N183 Chronic kidney disease, stage 3 (moderate): Secondary | ICD-10-CM | POA: Diagnosis not present

## 2018-12-08 DIAGNOSIS — Z7901 Long term (current) use of anticoagulants: Secondary | ICD-10-CM | POA: Diagnosis not present

## 2018-12-08 DIAGNOSIS — M6281 Muscle weakness (generalized): Secondary | ICD-10-CM | POA: Diagnosis not present

## 2018-12-08 DIAGNOSIS — R41841 Cognitive communication deficit: Secondary | ICD-10-CM | POA: Diagnosis not present

## 2018-12-09 ENCOUNTER — Encounter: Payer: Self-pay | Admitting: Cardiology

## 2018-12-09 DIAGNOSIS — R2681 Unsteadiness on feet: Secondary | ICD-10-CM | POA: Diagnosis not present

## 2018-12-09 DIAGNOSIS — R1311 Dysphagia, oral phase: Secondary | ICD-10-CM | POA: Diagnosis not present

## 2018-12-09 DIAGNOSIS — M6282 Rhabdomyolysis: Secondary | ICD-10-CM | POA: Diagnosis not present

## 2018-12-09 DIAGNOSIS — R2689 Other abnormalities of gait and mobility: Secondary | ICD-10-CM | POA: Diagnosis not present

## 2018-12-09 DIAGNOSIS — I5023 Acute on chronic systolic (congestive) heart failure: Secondary | ICD-10-CM | POA: Diagnosis not present

## 2018-12-09 DIAGNOSIS — R41841 Cognitive communication deficit: Secondary | ICD-10-CM | POA: Diagnosis not present

## 2018-12-09 DIAGNOSIS — N183 Chronic kidney disease, stage 3 (moderate): Secondary | ICD-10-CM | POA: Diagnosis not present

## 2018-12-09 DIAGNOSIS — R1312 Dysphagia, oropharyngeal phase: Secondary | ICD-10-CM | POA: Diagnosis not present

## 2018-12-09 DIAGNOSIS — M6281 Muscle weakness (generalized): Secondary | ICD-10-CM | POA: Diagnosis not present

## 2018-12-09 DIAGNOSIS — E119 Type 2 diabetes mellitus without complications: Secondary | ICD-10-CM | POA: Diagnosis not present

## 2018-12-09 NOTE — Progress Notes (Signed)
Remote pacemaker transmission.   

## 2018-12-10 DIAGNOSIS — I5023 Acute on chronic systolic (congestive) heart failure: Secondary | ICD-10-CM | POA: Diagnosis not present

## 2018-12-10 DIAGNOSIS — R2689 Other abnormalities of gait and mobility: Secondary | ICD-10-CM | POA: Diagnosis not present

## 2018-12-10 DIAGNOSIS — M6281 Muscle weakness (generalized): Secondary | ICD-10-CM | POA: Diagnosis not present

## 2018-12-10 DIAGNOSIS — N183 Chronic kidney disease, stage 3 (moderate): Secondary | ICD-10-CM | POA: Diagnosis not present

## 2018-12-10 DIAGNOSIS — R1312 Dysphagia, oropharyngeal phase: Secondary | ICD-10-CM | POA: Diagnosis not present

## 2018-12-10 DIAGNOSIS — M6282 Rhabdomyolysis: Secondary | ICD-10-CM | POA: Diagnosis not present

## 2018-12-10 DIAGNOSIS — R41841 Cognitive communication deficit: Secondary | ICD-10-CM | POA: Diagnosis not present

## 2018-12-10 DIAGNOSIS — R2681 Unsteadiness on feet: Secondary | ICD-10-CM | POA: Diagnosis not present

## 2018-12-10 DIAGNOSIS — R1311 Dysphagia, oral phase: Secondary | ICD-10-CM | POA: Diagnosis not present

## 2018-12-11 DIAGNOSIS — R41841 Cognitive communication deficit: Secondary | ICD-10-CM | POA: Diagnosis not present

## 2018-12-11 DIAGNOSIS — I5023 Acute on chronic systolic (congestive) heart failure: Secondary | ICD-10-CM | POA: Diagnosis not present

## 2018-12-11 DIAGNOSIS — M6282 Rhabdomyolysis: Secondary | ICD-10-CM | POA: Diagnosis not present

## 2018-12-11 DIAGNOSIS — R1312 Dysphagia, oropharyngeal phase: Secondary | ICD-10-CM | POA: Diagnosis not present

## 2018-12-11 DIAGNOSIS — R1311 Dysphagia, oral phase: Secondary | ICD-10-CM | POA: Diagnosis not present

## 2018-12-11 DIAGNOSIS — R2681 Unsteadiness on feet: Secondary | ICD-10-CM | POA: Diagnosis not present

## 2018-12-11 DIAGNOSIS — N183 Chronic kidney disease, stage 3 (moderate): Secondary | ICD-10-CM | POA: Diagnosis not present

## 2018-12-11 DIAGNOSIS — R2689 Other abnormalities of gait and mobility: Secondary | ICD-10-CM | POA: Diagnosis not present

## 2018-12-11 DIAGNOSIS — M6281 Muscle weakness (generalized): Secondary | ICD-10-CM | POA: Diagnosis not present

## 2018-12-12 DIAGNOSIS — R41841 Cognitive communication deficit: Secondary | ICD-10-CM | POA: Diagnosis not present

## 2018-12-12 DIAGNOSIS — I5023 Acute on chronic systolic (congestive) heart failure: Secondary | ICD-10-CM | POA: Diagnosis not present

## 2018-12-12 DIAGNOSIS — R1312 Dysphagia, oropharyngeal phase: Secondary | ICD-10-CM | POA: Diagnosis not present

## 2018-12-12 DIAGNOSIS — M6281 Muscle weakness (generalized): Secondary | ICD-10-CM | POA: Diagnosis not present

## 2018-12-12 DIAGNOSIS — R2681 Unsteadiness on feet: Secondary | ICD-10-CM | POA: Diagnosis not present

## 2018-12-12 DIAGNOSIS — R1311 Dysphagia, oral phase: Secondary | ICD-10-CM | POA: Diagnosis not present

## 2018-12-12 DIAGNOSIS — N183 Chronic kidney disease, stage 3 (moderate): Secondary | ICD-10-CM | POA: Diagnosis not present

## 2018-12-12 DIAGNOSIS — R2689 Other abnormalities of gait and mobility: Secondary | ICD-10-CM | POA: Diagnosis not present

## 2018-12-12 DIAGNOSIS — M6282 Rhabdomyolysis: Secondary | ICD-10-CM | POA: Diagnosis not present

## 2018-12-14 DIAGNOSIS — R41841 Cognitive communication deficit: Secondary | ICD-10-CM | POA: Diagnosis not present

## 2018-12-14 DIAGNOSIS — M6282 Rhabdomyolysis: Secondary | ICD-10-CM | POA: Diagnosis not present

## 2018-12-14 DIAGNOSIS — N183 Chronic kidney disease, stage 3 (moderate): Secondary | ICD-10-CM | POA: Diagnosis not present

## 2018-12-14 DIAGNOSIS — R1312 Dysphagia, oropharyngeal phase: Secondary | ICD-10-CM | POA: Diagnosis not present

## 2018-12-14 DIAGNOSIS — Z79899 Other long term (current) drug therapy: Secondary | ICD-10-CM | POA: Diagnosis not present

## 2018-12-14 DIAGNOSIS — I5023 Acute on chronic systolic (congestive) heart failure: Secondary | ICD-10-CM | POA: Diagnosis not present

## 2018-12-14 DIAGNOSIS — R2689 Other abnormalities of gait and mobility: Secondary | ICD-10-CM | POA: Diagnosis not present

## 2018-12-14 DIAGNOSIS — M6281 Muscle weakness (generalized): Secondary | ICD-10-CM | POA: Diagnosis not present

## 2018-12-14 DIAGNOSIS — R2681 Unsteadiness on feet: Secondary | ICD-10-CM | POA: Diagnosis not present

## 2018-12-14 DIAGNOSIS — R1311 Dysphagia, oral phase: Secondary | ICD-10-CM | POA: Diagnosis not present

## 2018-12-15 DIAGNOSIS — R1312 Dysphagia, oropharyngeal phase: Secondary | ICD-10-CM | POA: Diagnosis not present

## 2018-12-15 DIAGNOSIS — M6282 Rhabdomyolysis: Secondary | ICD-10-CM | POA: Diagnosis not present

## 2018-12-15 DIAGNOSIS — I480 Paroxysmal atrial fibrillation: Secondary | ICD-10-CM | POA: Diagnosis not present

## 2018-12-15 DIAGNOSIS — R2689 Other abnormalities of gait and mobility: Secondary | ICD-10-CM | POA: Diagnosis not present

## 2018-12-15 DIAGNOSIS — R41841 Cognitive communication deficit: Secondary | ICD-10-CM | POA: Diagnosis not present

## 2018-12-15 DIAGNOSIS — N183 Chronic kidney disease, stage 3 (moderate): Secondary | ICD-10-CM | POA: Diagnosis not present

## 2018-12-15 DIAGNOSIS — E876 Hypokalemia: Secondary | ICD-10-CM | POA: Diagnosis not present

## 2018-12-15 DIAGNOSIS — N184 Chronic kidney disease, stage 4 (severe): Secondary | ICD-10-CM | POA: Diagnosis not present

## 2018-12-15 DIAGNOSIS — R2681 Unsteadiness on feet: Secondary | ICD-10-CM | POA: Diagnosis not present

## 2018-12-15 DIAGNOSIS — R69 Illness, unspecified: Secondary | ICD-10-CM | POA: Diagnosis not present

## 2018-12-15 DIAGNOSIS — M6281 Muscle weakness (generalized): Secondary | ICD-10-CM | POA: Diagnosis not present

## 2018-12-15 DIAGNOSIS — E119 Type 2 diabetes mellitus without complications: Secondary | ICD-10-CM | POA: Diagnosis not present

## 2018-12-15 DIAGNOSIS — I5042 Chronic combined systolic (congestive) and diastolic (congestive) heart failure: Secondary | ICD-10-CM | POA: Diagnosis not present

## 2018-12-15 DIAGNOSIS — R1311 Dysphagia, oral phase: Secondary | ICD-10-CM | POA: Diagnosis not present

## 2018-12-15 DIAGNOSIS — I5023 Acute on chronic systolic (congestive) heart failure: Secondary | ICD-10-CM | POA: Diagnosis not present

## 2018-12-16 DIAGNOSIS — M6281 Muscle weakness (generalized): Secondary | ICD-10-CM | POA: Diagnosis not present

## 2018-12-16 DIAGNOSIS — R41841 Cognitive communication deficit: Secondary | ICD-10-CM | POA: Diagnosis not present

## 2018-12-16 DIAGNOSIS — R2689 Other abnormalities of gait and mobility: Secondary | ICD-10-CM | POA: Diagnosis not present

## 2018-12-16 DIAGNOSIS — R1312 Dysphagia, oropharyngeal phase: Secondary | ICD-10-CM | POA: Diagnosis not present

## 2018-12-16 DIAGNOSIS — R1311 Dysphagia, oral phase: Secondary | ICD-10-CM | POA: Diagnosis not present

## 2018-12-16 DIAGNOSIS — N183 Chronic kidney disease, stage 3 (moderate): Secondary | ICD-10-CM | POA: Diagnosis not present

## 2018-12-16 DIAGNOSIS — M6282 Rhabdomyolysis: Secondary | ICD-10-CM | POA: Diagnosis not present

## 2018-12-16 DIAGNOSIS — R2681 Unsteadiness on feet: Secondary | ICD-10-CM | POA: Diagnosis not present

## 2018-12-16 DIAGNOSIS — I5023 Acute on chronic systolic (congestive) heart failure: Secondary | ICD-10-CM | POA: Diagnosis not present

## 2018-12-17 DIAGNOSIS — N183 Chronic kidney disease, stage 3 (moderate): Secondary | ICD-10-CM | POA: Diagnosis not present

## 2018-12-17 DIAGNOSIS — R1312 Dysphagia, oropharyngeal phase: Secondary | ICD-10-CM | POA: Diagnosis not present

## 2018-12-17 DIAGNOSIS — M6282 Rhabdomyolysis: Secondary | ICD-10-CM | POA: Diagnosis not present

## 2018-12-17 DIAGNOSIS — I5023 Acute on chronic systolic (congestive) heart failure: Secondary | ICD-10-CM | POA: Diagnosis not present

## 2018-12-17 DIAGNOSIS — R1311 Dysphagia, oral phase: Secondary | ICD-10-CM | POA: Diagnosis not present

## 2018-12-17 DIAGNOSIS — R2681 Unsteadiness on feet: Secondary | ICD-10-CM | POA: Diagnosis not present

## 2018-12-17 DIAGNOSIS — R41841 Cognitive communication deficit: Secondary | ICD-10-CM | POA: Diagnosis not present

## 2018-12-17 DIAGNOSIS — R2689 Other abnormalities of gait and mobility: Secondary | ICD-10-CM | POA: Diagnosis not present

## 2018-12-17 DIAGNOSIS — M6281 Muscle weakness (generalized): Secondary | ICD-10-CM | POA: Diagnosis not present

## 2018-12-18 DIAGNOSIS — R1312 Dysphagia, oropharyngeal phase: Secondary | ICD-10-CM | POA: Diagnosis not present

## 2018-12-18 DIAGNOSIS — R2681 Unsteadiness on feet: Secondary | ICD-10-CM | POA: Diagnosis not present

## 2018-12-18 DIAGNOSIS — M6282 Rhabdomyolysis: Secondary | ICD-10-CM | POA: Diagnosis not present

## 2018-12-18 DIAGNOSIS — M6281 Muscle weakness (generalized): Secondary | ICD-10-CM | POA: Diagnosis not present

## 2018-12-18 DIAGNOSIS — R2689 Other abnormalities of gait and mobility: Secondary | ICD-10-CM | POA: Diagnosis not present

## 2018-12-18 DIAGNOSIS — Z79899 Other long term (current) drug therapy: Secondary | ICD-10-CM | POA: Diagnosis not present

## 2018-12-18 DIAGNOSIS — N183 Chronic kidney disease, stage 3 (moderate): Secondary | ICD-10-CM | POA: Diagnosis not present

## 2018-12-18 DIAGNOSIS — I5023 Acute on chronic systolic (congestive) heart failure: Secondary | ICD-10-CM | POA: Diagnosis not present

## 2018-12-18 DIAGNOSIS — R1311 Dysphagia, oral phase: Secondary | ICD-10-CM | POA: Diagnosis not present

## 2018-12-18 DIAGNOSIS — R41841 Cognitive communication deficit: Secondary | ICD-10-CM | POA: Diagnosis not present

## 2018-12-21 DIAGNOSIS — I5023 Acute on chronic systolic (congestive) heart failure: Secondary | ICD-10-CM | POA: Diagnosis not present

## 2018-12-21 DIAGNOSIS — I5021 Acute systolic (congestive) heart failure: Secondary | ICD-10-CM | POA: Diagnosis not present

## 2018-12-21 DIAGNOSIS — R2689 Other abnormalities of gait and mobility: Secondary | ICD-10-CM | POA: Diagnosis not present

## 2018-12-21 DIAGNOSIS — E876 Hypokalemia: Secondary | ICD-10-CM | POA: Diagnosis not present

## 2018-12-21 DIAGNOSIS — R1312 Dysphagia, oropharyngeal phase: Secondary | ICD-10-CM | POA: Diagnosis not present

## 2018-12-21 DIAGNOSIS — R41841 Cognitive communication deficit: Secondary | ICD-10-CM | POA: Diagnosis not present

## 2018-12-21 DIAGNOSIS — R2681 Unsteadiness on feet: Secondary | ICD-10-CM | POA: Diagnosis not present

## 2018-12-21 DIAGNOSIS — E1149 Type 2 diabetes mellitus with other diabetic neurological complication: Secondary | ICD-10-CM | POA: Diagnosis not present

## 2018-12-21 DIAGNOSIS — M6281 Muscle weakness (generalized): Secondary | ICD-10-CM | POA: Diagnosis not present

## 2018-12-21 DIAGNOSIS — N183 Chronic kidney disease, stage 3 (moderate): Secondary | ICD-10-CM | POA: Diagnosis not present

## 2018-12-21 DIAGNOSIS — N184 Chronic kidney disease, stage 4 (severe): Secondary | ICD-10-CM | POA: Diagnosis not present

## 2018-12-21 DIAGNOSIS — R1311 Dysphagia, oral phase: Secondary | ICD-10-CM | POA: Diagnosis not present

## 2018-12-21 DIAGNOSIS — M6282 Rhabdomyolysis: Secondary | ICD-10-CM | POA: Diagnosis not present

## 2018-12-22 DIAGNOSIS — D649 Anemia, unspecified: Secondary | ICD-10-CM | POA: Diagnosis not present

## 2018-12-22 DIAGNOSIS — I5023 Acute on chronic systolic (congestive) heart failure: Secondary | ICD-10-CM | POA: Diagnosis not present

## 2018-12-22 DIAGNOSIS — M6282 Rhabdomyolysis: Secondary | ICD-10-CM | POA: Diagnosis not present

## 2018-12-22 DIAGNOSIS — R1311 Dysphagia, oral phase: Secondary | ICD-10-CM | POA: Diagnosis not present

## 2018-12-22 DIAGNOSIS — R2689 Other abnormalities of gait and mobility: Secondary | ICD-10-CM | POA: Diagnosis not present

## 2018-12-22 DIAGNOSIS — R1312 Dysphagia, oropharyngeal phase: Secondary | ICD-10-CM | POA: Diagnosis not present

## 2018-12-22 DIAGNOSIS — R41841 Cognitive communication deficit: Secondary | ICD-10-CM | POA: Diagnosis not present

## 2018-12-22 DIAGNOSIS — R2681 Unsteadiness on feet: Secondary | ICD-10-CM | POA: Diagnosis not present

## 2018-12-22 DIAGNOSIS — M6281 Muscle weakness (generalized): Secondary | ICD-10-CM | POA: Diagnosis not present

## 2018-12-22 DIAGNOSIS — Z79899 Other long term (current) drug therapy: Secondary | ICD-10-CM | POA: Diagnosis not present

## 2018-12-22 DIAGNOSIS — N183 Chronic kidney disease, stage 3 (moderate): Secondary | ICD-10-CM | POA: Diagnosis not present

## 2018-12-23 DIAGNOSIS — M6282 Rhabdomyolysis: Secondary | ICD-10-CM | POA: Diagnosis not present

## 2018-12-23 DIAGNOSIS — M6281 Muscle weakness (generalized): Secondary | ICD-10-CM | POA: Diagnosis not present

## 2018-12-23 DIAGNOSIS — N183 Chronic kidney disease, stage 3 (moderate): Secondary | ICD-10-CM | POA: Diagnosis not present

## 2018-12-23 DIAGNOSIS — R41841 Cognitive communication deficit: Secondary | ICD-10-CM | POA: Diagnosis not present

## 2018-12-23 DIAGNOSIS — R2689 Other abnormalities of gait and mobility: Secondary | ICD-10-CM | POA: Diagnosis not present

## 2018-12-23 DIAGNOSIS — R2681 Unsteadiness on feet: Secondary | ICD-10-CM | POA: Diagnosis not present

## 2018-12-23 DIAGNOSIS — R1312 Dysphagia, oropharyngeal phase: Secondary | ICD-10-CM | POA: Diagnosis not present

## 2018-12-23 DIAGNOSIS — R1311 Dysphagia, oral phase: Secondary | ICD-10-CM | POA: Diagnosis not present

## 2018-12-23 DIAGNOSIS — I5023 Acute on chronic systolic (congestive) heart failure: Secondary | ICD-10-CM | POA: Diagnosis not present

## 2018-12-24 DIAGNOSIS — M6281 Muscle weakness (generalized): Secondary | ICD-10-CM | POA: Diagnosis not present

## 2018-12-24 DIAGNOSIS — R1312 Dysphagia, oropharyngeal phase: Secondary | ICD-10-CM | POA: Diagnosis not present

## 2018-12-24 DIAGNOSIS — I5023 Acute on chronic systolic (congestive) heart failure: Secondary | ICD-10-CM | POA: Diagnosis not present

## 2018-12-24 DIAGNOSIS — R2681 Unsteadiness on feet: Secondary | ICD-10-CM | POA: Diagnosis not present

## 2018-12-24 DIAGNOSIS — N183 Chronic kidney disease, stage 3 (moderate): Secondary | ICD-10-CM | POA: Diagnosis not present

## 2018-12-24 DIAGNOSIS — R1311 Dysphagia, oral phase: Secondary | ICD-10-CM | POA: Diagnosis not present

## 2018-12-24 DIAGNOSIS — R2689 Other abnormalities of gait and mobility: Secondary | ICD-10-CM | POA: Diagnosis not present

## 2018-12-24 DIAGNOSIS — M6282 Rhabdomyolysis: Secondary | ICD-10-CM | POA: Diagnosis not present

## 2018-12-24 DIAGNOSIS — D649 Anemia, unspecified: Secondary | ICD-10-CM | POA: Diagnosis not present

## 2018-12-24 DIAGNOSIS — R41841 Cognitive communication deficit: Secondary | ICD-10-CM | POA: Diagnosis not present

## 2018-12-25 DIAGNOSIS — I5023 Acute on chronic systolic (congestive) heart failure: Secondary | ICD-10-CM | POA: Diagnosis not present

## 2018-12-25 DIAGNOSIS — E876 Hypokalemia: Secondary | ICD-10-CM | POA: Diagnosis not present

## 2018-12-25 DIAGNOSIS — R2681 Unsteadiness on feet: Secondary | ICD-10-CM | POA: Diagnosis not present

## 2018-12-25 DIAGNOSIS — N183 Chronic kidney disease, stage 3 (moderate): Secondary | ICD-10-CM | POA: Diagnosis not present

## 2018-12-25 DIAGNOSIS — I480 Paroxysmal atrial fibrillation: Secondary | ICD-10-CM | POA: Diagnosis not present

## 2018-12-25 DIAGNOSIS — R2689 Other abnormalities of gait and mobility: Secondary | ICD-10-CM | POA: Diagnosis not present

## 2018-12-25 DIAGNOSIS — R1311 Dysphagia, oral phase: Secondary | ICD-10-CM | POA: Diagnosis not present

## 2018-12-25 DIAGNOSIS — R1312 Dysphagia, oropharyngeal phase: Secondary | ICD-10-CM | POA: Diagnosis not present

## 2018-12-25 DIAGNOSIS — R41841 Cognitive communication deficit: Secondary | ICD-10-CM | POA: Diagnosis not present

## 2018-12-25 DIAGNOSIS — M6282 Rhabdomyolysis: Secondary | ICD-10-CM | POA: Diagnosis not present

## 2018-12-25 DIAGNOSIS — M6281 Muscle weakness (generalized): Secondary | ICD-10-CM | POA: Diagnosis not present

## 2018-12-28 DIAGNOSIS — N183 Chronic kidney disease, stage 3 (moderate): Secondary | ICD-10-CM | POA: Diagnosis not present

## 2018-12-28 DIAGNOSIS — R41841 Cognitive communication deficit: Secondary | ICD-10-CM | POA: Diagnosis not present

## 2018-12-28 DIAGNOSIS — R1311 Dysphagia, oral phase: Secondary | ICD-10-CM | POA: Diagnosis not present

## 2018-12-28 DIAGNOSIS — I5023 Acute on chronic systolic (congestive) heart failure: Secondary | ICD-10-CM | POA: Diagnosis not present

## 2018-12-28 DIAGNOSIS — R1312 Dysphagia, oropharyngeal phase: Secondary | ICD-10-CM | POA: Diagnosis not present

## 2018-12-28 DIAGNOSIS — R2681 Unsteadiness on feet: Secondary | ICD-10-CM | POA: Diagnosis not present

## 2018-12-28 DIAGNOSIS — M6281 Muscle weakness (generalized): Secondary | ICD-10-CM | POA: Diagnosis not present

## 2018-12-28 DIAGNOSIS — M6282 Rhabdomyolysis: Secondary | ICD-10-CM | POA: Diagnosis not present

## 2018-12-28 DIAGNOSIS — R2689 Other abnormalities of gait and mobility: Secondary | ICD-10-CM | POA: Diagnosis not present

## 2018-12-29 DIAGNOSIS — R1312 Dysphagia, oropharyngeal phase: Secondary | ICD-10-CM | POA: Diagnosis not present

## 2018-12-29 DIAGNOSIS — R2681 Unsteadiness on feet: Secondary | ICD-10-CM | POA: Diagnosis not present

## 2018-12-29 DIAGNOSIS — I5023 Acute on chronic systolic (congestive) heart failure: Secondary | ICD-10-CM | POA: Diagnosis not present

## 2018-12-29 DIAGNOSIS — M6281 Muscle weakness (generalized): Secondary | ICD-10-CM | POA: Diagnosis not present

## 2018-12-29 DIAGNOSIS — M6282 Rhabdomyolysis: Secondary | ICD-10-CM | POA: Diagnosis not present

## 2018-12-29 DIAGNOSIS — R41841 Cognitive communication deficit: Secondary | ICD-10-CM | POA: Diagnosis not present

## 2018-12-29 DIAGNOSIS — R1311 Dysphagia, oral phase: Secondary | ICD-10-CM | POA: Diagnosis not present

## 2018-12-29 DIAGNOSIS — R2689 Other abnormalities of gait and mobility: Secondary | ICD-10-CM | POA: Diagnosis not present

## 2018-12-29 DIAGNOSIS — N183 Chronic kidney disease, stage 3 (moderate): Secondary | ICD-10-CM | POA: Diagnosis not present

## 2018-12-30 DIAGNOSIS — M6282 Rhabdomyolysis: Secondary | ICD-10-CM | POA: Diagnosis not present

## 2018-12-30 DIAGNOSIS — M6281 Muscle weakness (generalized): Secondary | ICD-10-CM | POA: Diagnosis not present

## 2018-12-30 DIAGNOSIS — R1312 Dysphagia, oropharyngeal phase: Secondary | ICD-10-CM | POA: Diagnosis not present

## 2018-12-30 DIAGNOSIS — I5023 Acute on chronic systolic (congestive) heart failure: Secondary | ICD-10-CM | POA: Diagnosis not present

## 2018-12-30 DIAGNOSIS — N183 Chronic kidney disease, stage 3 (moderate): Secondary | ICD-10-CM | POA: Diagnosis not present

## 2018-12-30 DIAGNOSIS — E119 Type 2 diabetes mellitus without complications: Secondary | ICD-10-CM | POA: Diagnosis not present

## 2018-12-30 DIAGNOSIS — E440 Moderate protein-calorie malnutrition: Secondary | ICD-10-CM | POA: Diagnosis not present

## 2018-12-30 DIAGNOSIS — R2689 Other abnormalities of gait and mobility: Secondary | ICD-10-CM | POA: Diagnosis not present

## 2018-12-30 DIAGNOSIS — R2681 Unsteadiness on feet: Secondary | ICD-10-CM | POA: Diagnosis not present

## 2018-12-30 DIAGNOSIS — R41841 Cognitive communication deficit: Secondary | ICD-10-CM | POA: Diagnosis not present

## 2018-12-30 DIAGNOSIS — R1311 Dysphagia, oral phase: Secondary | ICD-10-CM | POA: Diagnosis not present

## 2018-12-31 DIAGNOSIS — M6282 Rhabdomyolysis: Secondary | ICD-10-CM | POA: Diagnosis not present

## 2018-12-31 DIAGNOSIS — R41841 Cognitive communication deficit: Secondary | ICD-10-CM | POA: Diagnosis not present

## 2018-12-31 DIAGNOSIS — R2681 Unsteadiness on feet: Secondary | ICD-10-CM | POA: Diagnosis not present

## 2018-12-31 DIAGNOSIS — R2689 Other abnormalities of gait and mobility: Secondary | ICD-10-CM | POA: Diagnosis not present

## 2018-12-31 DIAGNOSIS — M6281 Muscle weakness (generalized): Secondary | ICD-10-CM | POA: Diagnosis not present

## 2018-12-31 DIAGNOSIS — R1312 Dysphagia, oropharyngeal phase: Secondary | ICD-10-CM | POA: Diagnosis not present

## 2018-12-31 DIAGNOSIS — R1311 Dysphagia, oral phase: Secondary | ICD-10-CM | POA: Diagnosis not present

## 2018-12-31 DIAGNOSIS — N183 Chronic kidney disease, stage 3 (moderate): Secondary | ICD-10-CM | POA: Diagnosis not present

## 2018-12-31 DIAGNOSIS — I5023 Acute on chronic systolic (congestive) heart failure: Secondary | ICD-10-CM | POA: Diagnosis not present

## 2019-01-01 DIAGNOSIS — M6282 Rhabdomyolysis: Secondary | ICD-10-CM | POA: Diagnosis not present

## 2019-01-01 DIAGNOSIS — R2681 Unsteadiness on feet: Secondary | ICD-10-CM | POA: Diagnosis not present

## 2019-01-01 DIAGNOSIS — I5023 Acute on chronic systolic (congestive) heart failure: Secondary | ICD-10-CM | POA: Diagnosis not present

## 2019-01-01 DIAGNOSIS — N183 Chronic kidney disease, stage 3 (moderate): Secondary | ICD-10-CM | POA: Diagnosis not present

## 2019-01-01 DIAGNOSIS — R41841 Cognitive communication deficit: Secondary | ICD-10-CM | POA: Diagnosis not present

## 2019-01-01 DIAGNOSIS — M6281 Muscle weakness (generalized): Secondary | ICD-10-CM | POA: Diagnosis not present

## 2019-01-01 DIAGNOSIS — R1312 Dysphagia, oropharyngeal phase: Secondary | ICD-10-CM | POA: Diagnosis not present

## 2019-01-01 DIAGNOSIS — R1311 Dysphagia, oral phase: Secondary | ICD-10-CM | POA: Diagnosis not present

## 2019-01-01 DIAGNOSIS — R2689 Other abnormalities of gait and mobility: Secondary | ICD-10-CM | POA: Diagnosis not present

## 2019-01-04 DIAGNOSIS — R1311 Dysphagia, oral phase: Secondary | ICD-10-CM | POA: Diagnosis not present

## 2019-01-04 DIAGNOSIS — R1312 Dysphagia, oropharyngeal phase: Secondary | ICD-10-CM | POA: Diagnosis not present

## 2019-01-04 DIAGNOSIS — R41841 Cognitive communication deficit: Secondary | ICD-10-CM | POA: Diagnosis not present

## 2019-01-04 DIAGNOSIS — M6282 Rhabdomyolysis: Secondary | ICD-10-CM | POA: Diagnosis not present

## 2019-01-04 DIAGNOSIS — N183 Chronic kidney disease, stage 3 (moderate): Secondary | ICD-10-CM | POA: Diagnosis not present

## 2019-01-04 DIAGNOSIS — R2689 Other abnormalities of gait and mobility: Secondary | ICD-10-CM | POA: Diagnosis not present

## 2019-01-04 DIAGNOSIS — M6281 Muscle weakness (generalized): Secondary | ICD-10-CM | POA: Diagnosis not present

## 2019-01-04 DIAGNOSIS — I5023 Acute on chronic systolic (congestive) heart failure: Secondary | ICD-10-CM | POA: Diagnosis not present

## 2019-01-04 DIAGNOSIS — R2681 Unsteadiness on feet: Secondary | ICD-10-CM | POA: Diagnosis not present

## 2019-01-05 DIAGNOSIS — R41841 Cognitive communication deficit: Secondary | ICD-10-CM | POA: Diagnosis not present

## 2019-01-05 DIAGNOSIS — R2681 Unsteadiness on feet: Secondary | ICD-10-CM | POA: Diagnosis not present

## 2019-01-05 DIAGNOSIS — M6281 Muscle weakness (generalized): Secondary | ICD-10-CM | POA: Diagnosis not present

## 2019-01-05 DIAGNOSIS — R1311 Dysphagia, oral phase: Secondary | ICD-10-CM | POA: Diagnosis not present

## 2019-01-05 DIAGNOSIS — R1312 Dysphagia, oropharyngeal phase: Secondary | ICD-10-CM | POA: Diagnosis not present

## 2019-01-05 DIAGNOSIS — M6282 Rhabdomyolysis: Secondary | ICD-10-CM | POA: Diagnosis not present

## 2019-01-05 DIAGNOSIS — I5023 Acute on chronic systolic (congestive) heart failure: Secondary | ICD-10-CM | POA: Diagnosis not present

## 2019-01-05 DIAGNOSIS — N183 Chronic kidney disease, stage 3 (moderate): Secondary | ICD-10-CM | POA: Diagnosis not present

## 2019-01-05 DIAGNOSIS — R2689 Other abnormalities of gait and mobility: Secondary | ICD-10-CM | POA: Diagnosis not present

## 2019-01-07 DIAGNOSIS — R1311 Dysphagia, oral phase: Secondary | ICD-10-CM | POA: Diagnosis not present

## 2019-01-07 DIAGNOSIS — I5023 Acute on chronic systolic (congestive) heart failure: Secondary | ICD-10-CM | POA: Diagnosis not present

## 2019-01-07 DIAGNOSIS — M6281 Muscle weakness (generalized): Secondary | ICD-10-CM | POA: Diagnosis not present

## 2019-01-07 DIAGNOSIS — R2689 Other abnormalities of gait and mobility: Secondary | ICD-10-CM | POA: Diagnosis not present

## 2019-01-07 DIAGNOSIS — R41841 Cognitive communication deficit: Secondary | ICD-10-CM | POA: Diagnosis not present

## 2019-01-07 DIAGNOSIS — R1312 Dysphagia, oropharyngeal phase: Secondary | ICD-10-CM | POA: Diagnosis not present

## 2019-01-07 DIAGNOSIS — M6282 Rhabdomyolysis: Secondary | ICD-10-CM | POA: Diagnosis not present

## 2019-01-07 DIAGNOSIS — N183 Chronic kidney disease, stage 3 (moderate): Secondary | ICD-10-CM | POA: Diagnosis not present

## 2019-01-07 DIAGNOSIS — R2681 Unsteadiness on feet: Secondary | ICD-10-CM | POA: Diagnosis not present

## 2019-01-08 DIAGNOSIS — I5023 Acute on chronic systolic (congestive) heart failure: Secondary | ICD-10-CM | POA: Diagnosis not present

## 2019-01-08 DIAGNOSIS — M6282 Rhabdomyolysis: Secondary | ICD-10-CM | POA: Diagnosis not present

## 2019-01-08 DIAGNOSIS — N183 Chronic kidney disease, stage 3 (moderate): Secondary | ICD-10-CM | POA: Diagnosis not present

## 2019-01-08 DIAGNOSIS — R1311 Dysphagia, oral phase: Secondary | ICD-10-CM | POA: Diagnosis not present

## 2019-01-08 DIAGNOSIS — M6281 Muscle weakness (generalized): Secondary | ICD-10-CM | POA: Diagnosis not present

## 2019-01-08 DIAGNOSIS — R2689 Other abnormalities of gait and mobility: Secondary | ICD-10-CM | POA: Diagnosis not present

## 2019-01-08 DIAGNOSIS — R41841 Cognitive communication deficit: Secondary | ICD-10-CM | POA: Diagnosis not present

## 2019-01-08 DIAGNOSIS — R1312 Dysphagia, oropharyngeal phase: Secondary | ICD-10-CM | POA: Diagnosis not present

## 2019-01-08 DIAGNOSIS — R2681 Unsteadiness on feet: Secondary | ICD-10-CM | POA: Diagnosis not present

## 2019-01-09 DIAGNOSIS — M6281 Muscle weakness (generalized): Secondary | ICD-10-CM | POA: Diagnosis not present

## 2019-01-09 DIAGNOSIS — M6282 Rhabdomyolysis: Secondary | ICD-10-CM | POA: Diagnosis not present

## 2019-01-09 DIAGNOSIS — R2689 Other abnormalities of gait and mobility: Secondary | ICD-10-CM | POA: Diagnosis not present

## 2019-01-09 DIAGNOSIS — N183 Chronic kidney disease, stage 3 (moderate): Secondary | ICD-10-CM | POA: Diagnosis not present

## 2019-01-09 DIAGNOSIS — I5023 Acute on chronic systolic (congestive) heart failure: Secondary | ICD-10-CM | POA: Diagnosis not present

## 2019-01-09 DIAGNOSIS — R2681 Unsteadiness on feet: Secondary | ICD-10-CM | POA: Diagnosis not present

## 2019-01-09 DIAGNOSIS — R41841 Cognitive communication deficit: Secondary | ICD-10-CM | POA: Diagnosis not present

## 2019-01-09 DIAGNOSIS — R1311 Dysphagia, oral phase: Secondary | ICD-10-CM | POA: Diagnosis not present

## 2019-01-09 DIAGNOSIS — R1312 Dysphagia, oropharyngeal phase: Secondary | ICD-10-CM | POA: Diagnosis not present

## 2019-01-10 DIAGNOSIS — R1311 Dysphagia, oral phase: Secondary | ICD-10-CM | POA: Diagnosis not present

## 2019-01-10 DIAGNOSIS — R1312 Dysphagia, oropharyngeal phase: Secondary | ICD-10-CM | POA: Diagnosis not present

## 2019-01-10 DIAGNOSIS — R2681 Unsteadiness on feet: Secondary | ICD-10-CM | POA: Diagnosis not present

## 2019-01-10 DIAGNOSIS — I5023 Acute on chronic systolic (congestive) heart failure: Secondary | ICD-10-CM | POA: Diagnosis not present

## 2019-01-10 DIAGNOSIS — M6281 Muscle weakness (generalized): Secondary | ICD-10-CM | POA: Diagnosis not present

## 2019-01-10 DIAGNOSIS — N183 Chronic kidney disease, stage 3 (moderate): Secondary | ICD-10-CM | POA: Diagnosis not present

## 2019-01-10 DIAGNOSIS — R2689 Other abnormalities of gait and mobility: Secondary | ICD-10-CM | POA: Diagnosis not present

## 2019-01-10 DIAGNOSIS — R41841 Cognitive communication deficit: Secondary | ICD-10-CM | POA: Diagnosis not present

## 2019-01-10 DIAGNOSIS — M6282 Rhabdomyolysis: Secondary | ICD-10-CM | POA: Diagnosis not present

## 2019-01-12 DIAGNOSIS — R2689 Other abnormalities of gait and mobility: Secondary | ICD-10-CM | POA: Diagnosis not present

## 2019-01-12 DIAGNOSIS — R2681 Unsteadiness on feet: Secondary | ICD-10-CM | POA: Diagnosis not present

## 2019-01-12 DIAGNOSIS — M6281 Muscle weakness (generalized): Secondary | ICD-10-CM | POA: Diagnosis not present

## 2019-01-12 DIAGNOSIS — R1311 Dysphagia, oral phase: Secondary | ICD-10-CM | POA: Diagnosis not present

## 2019-01-12 DIAGNOSIS — I5023 Acute on chronic systolic (congestive) heart failure: Secondary | ICD-10-CM | POA: Diagnosis not present

## 2019-01-12 DIAGNOSIS — R41841 Cognitive communication deficit: Secondary | ICD-10-CM | POA: Diagnosis not present

## 2019-01-12 DIAGNOSIS — R1312 Dysphagia, oropharyngeal phase: Secondary | ICD-10-CM | POA: Diagnosis not present

## 2019-01-12 DIAGNOSIS — N183 Chronic kidney disease, stage 3 (moderate): Secondary | ICD-10-CM | POA: Diagnosis not present

## 2019-01-12 DIAGNOSIS — M6282 Rhabdomyolysis: Secondary | ICD-10-CM | POA: Diagnosis not present

## 2019-01-13 DIAGNOSIS — M6282 Rhabdomyolysis: Secondary | ICD-10-CM | POA: Diagnosis not present

## 2019-01-13 DIAGNOSIS — R1311 Dysphagia, oral phase: Secondary | ICD-10-CM | POA: Diagnosis not present

## 2019-01-13 DIAGNOSIS — R2681 Unsteadiness on feet: Secondary | ICD-10-CM | POA: Diagnosis not present

## 2019-01-13 DIAGNOSIS — R41841 Cognitive communication deficit: Secondary | ICD-10-CM | POA: Diagnosis not present

## 2019-01-13 DIAGNOSIS — R2689 Other abnormalities of gait and mobility: Secondary | ICD-10-CM | POA: Diagnosis not present

## 2019-01-13 DIAGNOSIS — I5023 Acute on chronic systolic (congestive) heart failure: Secondary | ICD-10-CM | POA: Diagnosis not present

## 2019-01-13 DIAGNOSIS — R1312 Dysphagia, oropharyngeal phase: Secondary | ICD-10-CM | POA: Diagnosis not present

## 2019-01-13 DIAGNOSIS — M6281 Muscle weakness (generalized): Secondary | ICD-10-CM | POA: Diagnosis not present

## 2019-01-13 DIAGNOSIS — N183 Chronic kidney disease, stage 3 (moderate): Secondary | ICD-10-CM | POA: Diagnosis not present

## 2019-01-14 DIAGNOSIS — E118 Type 2 diabetes mellitus with unspecified complications: Secondary | ICD-10-CM | POA: Diagnosis not present

## 2019-01-14 DIAGNOSIS — I480 Paroxysmal atrial fibrillation: Secondary | ICD-10-CM | POA: Diagnosis not present

## 2019-01-14 DIAGNOSIS — I5042 Chronic combined systolic (congestive) and diastolic (congestive) heart failure: Secondary | ICD-10-CM | POA: Diagnosis not present

## 2019-01-14 DIAGNOSIS — R69 Illness, unspecified: Secondary | ICD-10-CM | POA: Diagnosis not present

## 2019-01-14 DIAGNOSIS — I2510 Atherosclerotic heart disease of native coronary artery without angina pectoris: Secondary | ICD-10-CM | POA: Diagnosis not present

## 2019-01-22 DIAGNOSIS — B349 Viral infection, unspecified: Secondary | ICD-10-CM | POA: Diagnosis not present

## 2019-02-12 DIAGNOSIS — R509 Fever, unspecified: Secondary | ICD-10-CM | POA: Diagnosis not present

## 2019-02-12 DIAGNOSIS — N390 Urinary tract infection, site not specified: Secondary | ICD-10-CM | POA: Diagnosis not present

## 2019-02-12 DIAGNOSIS — D649 Anemia, unspecified: Secondary | ICD-10-CM | POA: Diagnosis not present

## 2019-02-13 DIAGNOSIS — B349 Viral infection, unspecified: Secondary | ICD-10-CM | POA: Diagnosis not present

## 2019-02-15 DIAGNOSIS — E876 Hypokalemia: Secondary | ICD-10-CM | POA: Diagnosis not present

## 2019-02-15 DIAGNOSIS — I5042 Chronic combined systolic (congestive) and diastolic (congestive) heart failure: Secondary | ICD-10-CM | POA: Diagnosis not present

## 2019-02-15 DIAGNOSIS — N390 Urinary tract infection, site not specified: Secondary | ICD-10-CM | POA: Diagnosis not present

## 2019-02-15 DIAGNOSIS — N184 Chronic kidney disease, stage 4 (severe): Secondary | ICD-10-CM | POA: Diagnosis not present

## 2019-02-15 DIAGNOSIS — J188 Other pneumonia, unspecified organism: Secondary | ICD-10-CM | POA: Diagnosis not present

## 2019-02-16 DIAGNOSIS — N390 Urinary tract infection, site not specified: Secondary | ICD-10-CM | POA: Diagnosis not present

## 2019-02-16 DIAGNOSIS — J188 Other pneumonia, unspecified organism: Secondary | ICD-10-CM | POA: Diagnosis not present

## 2019-02-18 ENCOUNTER — Ambulatory Visit: Payer: Medicare HMO | Admitting: Neurology

## 2019-02-18 DIAGNOSIS — I5032 Chronic diastolic (congestive) heart failure: Secondary | ICD-10-CM | POA: Diagnosis not present

## 2019-02-19 DIAGNOSIS — E876 Hypokalemia: Secondary | ICD-10-CM | POA: Diagnosis not present

## 2019-02-19 DIAGNOSIS — U071 COVID-19: Secondary | ICD-10-CM | POA: Diagnosis not present

## 2019-02-19 DIAGNOSIS — I5042 Chronic combined systolic (congestive) and diastolic (congestive) heart failure: Secondary | ICD-10-CM | POA: Diagnosis not present

## 2019-02-19 DIAGNOSIS — N184 Chronic kidney disease, stage 4 (severe): Secondary | ICD-10-CM | POA: Diagnosis not present

## 2019-02-22 ENCOUNTER — Emergency Department (HOSPITAL_COMMUNITY): Payer: Medicare HMO

## 2019-02-22 ENCOUNTER — Other Ambulatory Visit: Payer: Self-pay

## 2019-02-22 ENCOUNTER — Encounter (HOSPITAL_COMMUNITY): Payer: Self-pay | Admitting: Emergency Medicine

## 2019-02-22 ENCOUNTER — Inpatient Hospital Stay (HOSPITAL_COMMUNITY)
Admission: EM | Admit: 2019-02-22 | Discharge: 2019-03-01 | DRG: 177 | Disposition: E | Payer: Medicare HMO | Source: Skilled Nursing Facility | Attending: Internal Medicine | Admitting: Internal Medicine

## 2019-02-22 DIAGNOSIS — N179 Acute kidney failure, unspecified: Secondary | ICD-10-CM | POA: Diagnosis not present

## 2019-02-22 DIAGNOSIS — F05 Delirium due to known physiological condition: Secondary | ICD-10-CM | POA: Diagnosis present

## 2019-02-22 DIAGNOSIS — I495 Sick sinus syndrome: Secondary | ICD-10-CM | POA: Diagnosis present

## 2019-02-22 DIAGNOSIS — D649 Anemia, unspecified: Secondary | ICD-10-CM | POA: Diagnosis not present

## 2019-02-22 DIAGNOSIS — Z86711 Personal history of pulmonary embolism: Secondary | ICD-10-CM | POA: Diagnosis not present

## 2019-02-22 DIAGNOSIS — E876 Hypokalemia: Secondary | ICD-10-CM | POA: Diagnosis present

## 2019-02-22 DIAGNOSIS — J1289 Other viral pneumonia: Secondary | ICD-10-CM | POA: Diagnosis not present

## 2019-02-22 DIAGNOSIS — E860 Dehydration: Secondary | ICD-10-CM | POA: Diagnosis present

## 2019-02-22 DIAGNOSIS — Z881 Allergy status to other antibiotic agents status: Secondary | ICD-10-CM

## 2019-02-22 DIAGNOSIS — R0602 Shortness of breath: Secondary | ICD-10-CM | POA: Diagnosis not present

## 2019-02-22 DIAGNOSIS — Z950 Presence of cardiac pacemaker: Secondary | ICD-10-CM | POA: Diagnosis not present

## 2019-02-22 DIAGNOSIS — Z8249 Family history of ischemic heart disease and other diseases of the circulatory system: Secondary | ICD-10-CM

## 2019-02-22 DIAGNOSIS — N189 Chronic kidney disease, unspecified: Secondary | ICD-10-CM | POA: Diagnosis not present

## 2019-02-22 DIAGNOSIS — I130 Hypertensive heart and chronic kidney disease with heart failure and stage 1 through stage 4 chronic kidney disease, or unspecified chronic kidney disease: Secondary | ICD-10-CM | POA: Diagnosis present

## 2019-02-22 DIAGNOSIS — E1142 Type 2 diabetes mellitus with diabetic polyneuropathy: Secondary | ICD-10-CM | POA: Diagnosis present

## 2019-02-22 DIAGNOSIS — I482 Chronic atrial fibrillation, unspecified: Secondary | ICD-10-CM | POA: Diagnosis present

## 2019-02-22 DIAGNOSIS — R404 Transient alteration of awareness: Secondary | ICD-10-CM | POA: Diagnosis not present

## 2019-02-22 DIAGNOSIS — Z853 Personal history of malignant neoplasm of breast: Secondary | ICD-10-CM

## 2019-02-22 DIAGNOSIS — F419 Anxiety disorder, unspecified: Secondary | ICD-10-CM | POA: Diagnosis present

## 2019-02-22 DIAGNOSIS — I5043 Acute on chronic combined systolic (congestive) and diastolic (congestive) heart failure: Secondary | ICD-10-CM | POA: Diagnosis not present

## 2019-02-22 DIAGNOSIS — M545 Low back pain: Secondary | ICD-10-CM | POA: Diagnosis present

## 2019-02-22 DIAGNOSIS — U071 COVID-19: Principal | ICD-10-CM | POA: Diagnosis present

## 2019-02-22 DIAGNOSIS — Z452 Encounter for adjustment and management of vascular access device: Secondary | ICD-10-CM | POA: Diagnosis not present

## 2019-02-22 DIAGNOSIS — Z86718 Personal history of other venous thrombosis and embolism: Secondary | ICD-10-CM

## 2019-02-22 DIAGNOSIS — J9601 Acute respiratory failure with hypoxia: Secondary | ICD-10-CM | POA: Diagnosis present

## 2019-02-22 DIAGNOSIS — I272 Pulmonary hypertension, unspecified: Secondary | ICD-10-CM | POA: Diagnosis present

## 2019-02-22 DIAGNOSIS — I959 Hypotension, unspecified: Secondary | ICD-10-CM | POA: Diagnosis not present

## 2019-02-22 DIAGNOSIS — Z9012 Acquired absence of left breast and nipple: Secondary | ICD-10-CM

## 2019-02-22 DIAGNOSIS — R531 Weakness: Secondary | ICD-10-CM

## 2019-02-22 DIAGNOSIS — F0390 Unspecified dementia without behavioral disturbance: Secondary | ICD-10-CM | POA: Diagnosis present

## 2019-02-22 DIAGNOSIS — Z86010 Personal history of colonic polyps: Secondary | ICD-10-CM

## 2019-02-22 DIAGNOSIS — I872 Venous insufficiency (chronic) (peripheral): Secondary | ICD-10-CM | POA: Diagnosis present

## 2019-02-22 DIAGNOSIS — N390 Urinary tract infection, site not specified: Secondary | ICD-10-CM | POA: Diagnosis present

## 2019-02-22 DIAGNOSIS — G92 Toxic encephalopathy: Secondary | ICD-10-CM | POA: Diagnosis present

## 2019-02-22 DIAGNOSIS — G8929 Other chronic pain: Secondary | ICD-10-CM | POA: Diagnosis present

## 2019-02-22 DIAGNOSIS — N184 Chronic kidney disease, stage 4 (severe): Secondary | ICD-10-CM | POA: Diagnosis not present

## 2019-02-22 DIAGNOSIS — Z8711 Personal history of peptic ulcer disease: Secondary | ICD-10-CM

## 2019-02-22 DIAGNOSIS — E870 Hyperosmolality and hypernatremia: Secondary | ICD-10-CM | POA: Diagnosis present

## 2019-02-22 DIAGNOSIS — J189 Pneumonia, unspecified organism: Secondary | ICD-10-CM | POA: Diagnosis not present

## 2019-02-22 DIAGNOSIS — I340 Nonrheumatic mitral (valve) insufficiency: Secondary | ICD-10-CM | POA: Diagnosis present

## 2019-02-22 DIAGNOSIS — R680 Hypothermia, not associated with low environmental temperature: Secondary | ICD-10-CM | POA: Diagnosis present

## 2019-02-22 DIAGNOSIS — Z825 Family history of asthma and other chronic lower respiratory diseases: Secondary | ICD-10-CM

## 2019-02-22 DIAGNOSIS — Z885 Allergy status to narcotic agent status: Secondary | ICD-10-CM

## 2019-02-22 DIAGNOSIS — R0902 Hypoxemia: Secondary | ICD-10-CM

## 2019-02-22 DIAGNOSIS — T68XXXA Hypothermia, initial encounter: Secondary | ICD-10-CM | POA: Diagnosis not present

## 2019-02-22 DIAGNOSIS — Z515 Encounter for palliative care: Secondary | ICD-10-CM | POA: Diagnosis not present

## 2019-02-22 DIAGNOSIS — Z882 Allergy status to sulfonamides status: Secondary | ICD-10-CM

## 2019-02-22 DIAGNOSIS — M8580 Other specified disorders of bone density and structure, unspecified site: Secondary | ICD-10-CM | POA: Diagnosis present

## 2019-02-22 DIAGNOSIS — I4891 Unspecified atrial fibrillation: Secondary | ICD-10-CM | POA: Diagnosis not present

## 2019-02-22 DIAGNOSIS — Z79899 Other long term (current) drug therapy: Secondary | ICD-10-CM | POA: Diagnosis not present

## 2019-02-22 DIAGNOSIS — E872 Acidosis: Secondary | ICD-10-CM | POA: Diagnosis not present

## 2019-02-22 DIAGNOSIS — Z87440 Personal history of urinary (tract) infections: Secondary | ICD-10-CM

## 2019-02-22 DIAGNOSIS — Z7901 Long term (current) use of anticoagulants: Secondary | ICD-10-CM | POA: Diagnosis not present

## 2019-02-22 DIAGNOSIS — Z87891 Personal history of nicotine dependence: Secondary | ICD-10-CM

## 2019-02-22 DIAGNOSIS — K219 Gastro-esophageal reflux disease without esophagitis: Secondary | ICD-10-CM | POA: Diagnosis present

## 2019-02-22 DIAGNOSIS — Z91018 Allergy to other foods: Secondary | ICD-10-CM

## 2019-02-22 DIAGNOSIS — E1122 Type 2 diabetes mellitus with diabetic chronic kidney disease: Secondary | ICD-10-CM | POA: Diagnosis present

## 2019-02-22 DIAGNOSIS — Z66 Do not resuscitate: Secondary | ICD-10-CM | POA: Diagnosis not present

## 2019-02-22 DIAGNOSIS — Z90710 Acquired absence of both cervix and uterus: Secondary | ICD-10-CM

## 2019-02-22 DIAGNOSIS — E11649 Type 2 diabetes mellitus with hypoglycemia without coma: Secondary | ICD-10-CM | POA: Diagnosis not present

## 2019-02-22 HISTORY — DX: Heart failure, unspecified: I50.9

## 2019-02-22 LAB — CBC WITH DIFFERENTIAL/PLATELET
Abs Immature Granulocytes: 0.09 10*3/uL — ABNORMAL HIGH (ref 0.00–0.07)
Basophils Absolute: 0 10*3/uL (ref 0.0–0.1)
Basophils Relative: 0 %
Eosinophils Absolute: 0 10*3/uL (ref 0.0–0.5)
Eosinophils Relative: 0 %
HCT: 47 % — ABNORMAL HIGH (ref 36.0–46.0)
Hemoglobin: 14.4 g/dL (ref 12.0–15.0)
Immature Granulocytes: 2 %
Lymphocytes Relative: 8 %
Lymphs Abs: 0.4 10*3/uL — ABNORMAL LOW (ref 0.7–4.0)
MCH: 26.2 pg (ref 26.0–34.0)
MCHC: 30.6 g/dL (ref 30.0–36.0)
MCV: 85.6 fL (ref 80.0–100.0)
Monocytes Absolute: 0.2 10*3/uL (ref 0.1–1.0)
Monocytes Relative: 3 %
Neutro Abs: 4.1 10*3/uL (ref 1.7–7.7)
Neutrophils Relative %: 87 %
Platelets: 198 10*3/uL (ref 150–400)
RBC: 5.49 MIL/uL — ABNORMAL HIGH (ref 3.87–5.11)
RDW: 16.4 % — ABNORMAL HIGH (ref 11.5–15.5)
WBC: 4.7 10*3/uL (ref 4.0–10.5)
nRBC: 0 % (ref 0.0–0.2)

## 2019-02-22 LAB — COMPREHENSIVE METABOLIC PANEL
ALT: 11 U/L (ref 0–44)
AST: 37 U/L (ref 15–41)
Albumin: 2.1 g/dL — ABNORMAL LOW (ref 3.5–5.0)
Alkaline Phosphatase: 125 U/L (ref 38–126)
Anion gap: 15 (ref 5–15)
BUN: 79 mg/dL — ABNORMAL HIGH (ref 8–23)
CO2: 20 mmol/L — ABNORMAL LOW (ref 22–32)
Calcium: 8.4 mg/dL — ABNORMAL LOW (ref 8.9–10.3)
Chloride: 106 mmol/L (ref 98–111)
Creatinine, Ser: 3.93 mg/dL — ABNORMAL HIGH (ref 0.44–1.00)
GFR calc Af Amer: 11 mL/min — ABNORMAL LOW (ref >60)
GFR calc non Af Amer: 10 mL/min — ABNORMAL LOW (ref >60)
Glucose, Bld: 97 mg/dL (ref 70–99)
Potassium: 4.4 mmol/L (ref 3.5–5.1)
Sodium: 141 mmol/L (ref 135–145)
Total Bilirubin: 1.2 mg/dL (ref 0.3–1.2)
Total Protein: 7 g/dL (ref 6.5–8.1)

## 2019-02-22 LAB — BRAIN NATRIURETIC PEPTIDE: B Natriuretic Peptide: 442.7 pg/mL — ABNORMAL HIGH (ref 0.0–100.0)

## 2019-02-22 LAB — SARS CORONAVIRUS 2 BY RT PCR (HOSPITAL ORDER, PERFORMED IN ~~LOC~~ HOSPITAL LAB): SARS Coronavirus 2: POSITIVE — AB

## 2019-02-22 LAB — CBG MONITORING, ED: Glucose-Capillary: 93 mg/dL (ref 70–99)

## 2019-02-22 LAB — I-STAT TROPONIN, ED: Troponin i, poc: 0.04 ng/mL (ref 0.00–0.08)

## 2019-02-22 LAB — LACTIC ACID, PLASMA
Lactic Acid, Venous: 2.2 mmol/L (ref 0.5–1.9)
Lactic Acid, Venous: 1.1 mmol/L (ref 0.5–1.9)

## 2019-02-22 LAB — PROTIME-INR
INR: 2.8 — ABNORMAL HIGH (ref 0.8–1.2)
Prothrombin Time: 29.1 seconds — ABNORMAL HIGH (ref 11.4–15.2)

## 2019-02-22 LAB — PROCALCITONIN: Procalcitonin: 0.31 ng/mL

## 2019-02-22 MED ORDER — VANCOMYCIN HCL 10 G IV SOLR
1750.0000 mg | Freq: Once | INTRAVENOUS | Status: DC
  Administered 2019-02-22: 22:00:00 1750 mg via INTRAVENOUS
  Filled 2019-02-22 (×2): qty 1750

## 2019-02-22 MED ORDER — METRONIDAZOLE IN NACL 5-0.79 MG/ML-% IV SOLN
500.0000 mg | Freq: Three times a day (TID) | INTRAVENOUS | Status: DC
  Administered 2019-02-22 – 2019-02-23 (×2): 500 mg via INTRAVENOUS
  Filled 2019-02-22 (×3): qty 100

## 2019-02-22 MED ORDER — SODIUM CHLORIDE 0.9 % IV BOLUS
1000.0000 mL | Freq: Once | INTRAVENOUS | Status: AC
  Administered 2019-02-22: 15:00:00 1000 mL via INTRAVENOUS

## 2019-02-22 MED ORDER — DEXTROSE-NACL 5-0.9 % IV SOLN: INTRAVENOUS | Status: DC

## 2019-02-22 MED ORDER — SODIUM CHLORIDE 0.9 % IV BOLUS
500.0000 mL | Freq: Once | INTRAVENOUS | Status: AC
  Administered 2019-02-22: 500 mL via INTRAVENOUS

## 2019-02-22 MED ORDER — ZINC SULFATE 220 (50 ZN) MG PO CAPS
220.0000 mg | ORAL_CAPSULE | Freq: Every day | ORAL | Status: DC
  Administered 2019-02-22 – 2019-02-26 (×5): 220 mg via ORAL
  Filled 2019-02-22 (×5): qty 1

## 2019-02-22 MED ORDER — WARFARIN - PHARMACIST DOSING INPATIENT: Freq: Every day | Status: DC

## 2019-02-22 MED ORDER — DILTIAZEM HCL ER COATED BEADS 120 MG PO CP24
120.0000 mg | ORAL_CAPSULE | Freq: Every day | ORAL | Status: DC
  Administered 2019-02-23 – 2019-02-26 (×4): 120 mg via ORAL
  Filled 2019-02-22 (×5): qty 1

## 2019-02-22 MED ORDER — VITAMIN C 500 MG PO TABS
500.0000 mg | ORAL_TABLET | Freq: Every day | ORAL | Status: DC
  Administered 2019-02-22 – 2019-02-26 (×5): 500 mg via ORAL
  Filled 2019-02-22 (×5): qty 1

## 2019-02-22 MED ORDER — WARFARIN SODIUM 1 MG PO TABS
1.5000 mg | ORAL_TABLET | Freq: Once | ORAL | Status: AC
  Administered 2019-02-22: 1.5 mg via ORAL
  Filled 2019-02-22 (×2): qty 1

## 2019-02-22 MED ORDER — ONDANSETRON HCL 4 MG/2ML IJ SOLN: 4.0000 mg | Freq: Four times a day (QID) | INTRAMUSCULAR | Status: DC | PRN

## 2019-02-22 MED ORDER — SODIUM CHLORIDE 0.9 % IV SOLN
INTRAVENOUS | Status: DC
  Administered 2019-02-22: 21:00:00 via INTRAVENOUS

## 2019-02-22 MED ORDER — DEXTROSE 50 % IV SOLN
25.0000 mL | Freq: Once | INTRAVENOUS | Status: AC
  Administered 2019-02-22: 23:00:00 25 mL via INTRAVENOUS

## 2019-02-22 MED ORDER — SODIUM CHLORIDE 0.9 % IV SOLN
2.0000 g | Freq: Once | INTRAVENOUS | Status: AC
  Administered 2019-02-22: 2 g via INTRAVENOUS
  Filled 2019-02-22: qty 2

## 2019-02-22 MED ORDER — ONDANSETRON HCL 4 MG PO TABS: 4.0000 mg | ORAL_TABLET | Freq: Four times a day (QID) | ORAL | Status: DC | PRN

## 2019-02-22 MED ORDER — BETHANECHOL CHLORIDE 25 MG PO TABS
25.0000 mg | ORAL_TABLET | Freq: Two times a day (BID) | ORAL | Status: DC
  Administered 2019-02-22 – 2019-02-26 (×5): 25 mg via ORAL
  Filled 2019-02-22 (×12): qty 1

## 2019-02-22 MED ORDER — VANCOMYCIN HCL IN DEXTROSE 1-5 GM/200ML-% IV SOLN: 1000.0000 mg | Freq: Once | INTRAVENOUS | Status: DC

## 2019-02-22 MED ORDER — ALBUTEROL SULFATE HFA 108 (90 BASE) MCG/ACT IN AERS
2.0000 | INHALATION_SPRAY | Freq: Four times a day (QID) | RESPIRATORY_TRACT | Status: DC
  Administered 2019-02-22: 2 via RESPIRATORY_TRACT
  Filled 2019-02-22: qty 6.7

## 2019-02-22 MED ORDER — FAMOTIDINE 20 MG PO TABS
20.0000 mg | ORAL_TABLET | Freq: Every day | ORAL | Status: DC
  Administered 2019-02-22 – 2019-02-26 (×5): 20 mg via ORAL
  Filled 2019-02-22 (×5): qty 1

## 2019-02-22 MED ORDER — VANCOMYCIN HCL IN DEXTROSE 750-5 MG/150ML-% IV SOLN: 750.0000 mg | INTRAVENOUS | Status: DC

## 2019-02-22 MED ORDER — SODIUM CHLORIDE 0.9 % IV SOLN
2.0000 g | INTRAVENOUS | Status: DC
  Administered 2019-02-23 – 2019-02-25 (×3): 2 g via INTRAVENOUS
  Filled 2019-02-22 (×3): qty 2

## 2019-02-22 MED ORDER — SODIUM CHLORIDE 0.9 % IV SOLN
INTRAVENOUS | Status: DC | PRN
  Administered 2019-02-22: 22:00:00 via INTRAVENOUS

## 2019-02-22 MED ORDER — SODIUM CHLORIDE 0.9% FLUSH
3.0000 mL | Freq: Two times a day (BID) | INTRAVENOUS | Status: DC
  Administered 2019-02-23 – 2019-02-25 (×2): 3 mL via INTRAVENOUS

## 2019-02-22 MED ORDER — DEXTROSE 50 % IV SOLN
INTRAVENOUS | Status: AC
  Filled 2019-02-22: qty 50

## 2019-02-22 MED ORDER — GUAIFENESIN-DM 100-10 MG/5ML PO SYRP: 10.0000 mL | ORAL_SOLUTION | ORAL | Status: DC | PRN

## 2019-02-22 MED ORDER — DEXTROSE-NACL 5-0.9 % IV SOLN
INTRAVENOUS | Status: DC
  Administered 2019-02-23: via INTRAVENOUS

## 2019-02-22 MED ORDER — MEMANTINE HCL 5 MG PO TABS
5.0000 mg | ORAL_TABLET | Freq: Two times a day (BID) | ORAL | Status: DC
  Administered 2019-02-22 – 2019-02-26 (×6): 5 mg via ORAL
  Filled 2019-02-22 (×12): qty 1

## 2019-02-22 MED ORDER — INSULIN ASPART 100 UNIT/ML ~~LOC~~ SOLN: 0.0000 [IU] | Freq: Three times a day (TID) | SUBCUTANEOUS | Status: DC

## 2019-02-22 NOTE — ED Notes (Signed)
This RN called patient's family to give update. Ruzie and Marla(Daughters) were not reached and No voicemail was left because it was not clear who's voicemail it was.  PT's son [name] [name]) was reached on his cell phone. He was given an update and every question was answered as my Scope of Practice permits. He was also given the phone number to [facility] to call and check on Mother

## 2019-02-22 NOTE — Progress Notes (Signed)
ANTICOAGULATION CONSULT NOTE - Initial Consult  Pharmacy Consult for Warfarin Indication: atrial fibrillation  Allergies  Allergen Reactions  . Amiodarone Shortness Of Breath and Nausea Only    "Toxicity," also  . Flecainide Shortness Of Breath and Other (See Comments)    "Toxicity," also   . Morphine And Related Nausea And Vomiting and Other (See Comments)    Family requests no morphine due to past reaction. Pt stated it made her sick, severe nausea and vomiting  . Bactrim Ds [Sulfamethoxazole-Trimethoprim] Diarrhea  . Cephalexin Nausea And Vomiting  . Pineapple Itching    Severe itching   Labs: Recent Labs    02/23/2019 1322  HGB 14.4  HCT 47.0*  PLT 198  LABPROT 29.1*  INR 2.8*  CREATININE 3.93*    CrCl cannot be calculated (Unknown ideal weight.).   Medical History: Past Medical History:  Diagnosis Date  . Acute gastric ulcer without mention of hemorrhage, perforation, or obstruction 2006   EGD  . Anxiety   . Atrial fibrillation (HCC)    flecainide; coumadin  . Breast cancer ([place])    left  . Carotid artery disease ([place])    18/29: RICA 9-37%, LICA 16-96%  //  Carotiud [place] 78/93: RICA 8-10%; LICA 17-51% >> FU 1 year   . CHF (congestive heart failure) ([place])   . Chronic diastolic heart failure (HCC)    a. echo 10/10: mild LVH, EF 55-60%, mild AI, mild MR, mild to mod LAE, mild RVE, severe RAE, mod TR, PASP 53  /  b.  Echo 3/14:  Mild LVH, EF 55%, Tr AI, MAC, mild MR, mod LAE, PASP 31, trivial eff. // c. echo 1/17: EF 55-60%, mild AI, MAC, mild MR, moderate BAE, moderate TR  . Chronic lower back pain   . Dementia without behavioral disturbance ([place]) 11/24/2018  . Diabetic peripheral neuropathy ([place]) 03/09/2015  . Diverticulosis   . Esophagitis, unspecified 2006   EGD  . Hiatal hernia 2006   EGD  . History of nuclear stress test    a. Myoview 2/12: EF 69%, no scar or ischemia  . Hx of adenomatous colonic polyps   . Hypertension   . Mediastinal lymphadenopathy   .  Memory difficulty 11/14/2016  . Mitral regurgitation    mild, echo, October, 2010  . Osteopenia   . Presence of permanent cardiac pacemaker   . Pulmonary embolus [facility]) 2006   2006, with DVT  . Pulmonary HTN ([place])    53 mmHg echo, 2010, moderate TR, mild right ventricular enlargement  . Tachy-brady syndrome ([place])    s/p pacer 07/2009  . Thyroid nodule    non-neoplastic goiter  . Type II diabetes mellitus ([place])   . Venous insufficiency    Chronic    Medications:  Scheduled:  . albuterol  2 puff Inhalation Q6H  . bethanechol  25 mg Oral BID  . [START ON 02/23/2019] diltiazem  120 mg Oral Daily  . famotidine  20 mg Oral Daily  . memantine  5 mg Oral BID  . sodium chloride flush  3 mL Intravenous Q12H  . vitamin C  500 mg Oral Daily  . zinc sulfate  220 mg Oral Daily   Infusions:  . sodium chloride      Assessment: Pt is a 33 yoF on warfarin PTA for atrial fibrillation. PTA dose variable (1-2 mg daily). Most recently, she was taking 2 mg daily, last administered 5/24. Per nursing [facility], she was started on Levaquin 5/16, last dose yesterday  5/24.   INR 2.8 on admission. CBC okay.    Goal of Therapy:  INR 2-3 Monitor platelets by anticoagulation protocol: Yes   Plan:  Warfarin 1.5 mg x1 Daily INR and CBC. Monitor for signs/symptoms of bleeding   Claiborne Billings, PharmD PGY2 Cardiology Pharmacy Resident Please check AMION for all Pharmacist numbers by unit 02/04/2019 5:08 PM

## 2019-02-22 NOTE — Progress Notes (Signed)
PROGRESS NOTE  Subjective: Patient admitted this afternoon for covid-19 infection, transferred to [facility] and evaluated this evening. She is tired, denies dyspnea or chest pain or other pain.   Objective: BP (!) 92/57   Pulse 70   Temp (!) 97.5 F (36.4 C) (Axillary)   Resp 15   Wt 88 kg   SpO2 92%   BMI 26.31 kg/m   Gen: Frail, elderly female in no distress Pulm: Nonlabored on 5L [place], crackles at bases bilaterally.  CV: RRR, no murmur, no JVD, no significant edema GI: Soft, NT, ND, +BS  Neuro: Alert, oriented x1, diffusely weak Skin: Stasis changes to bilateral LE's  Assessment & Plan: Covid-19 infection: Lactic acid normalized. Procalcitonin 0.31. MRSA PCR screen has always been negative, CXR not consistent with focal infiltrates.  - DC vancomycin to minimize risk to kidneys - Continue other antibiotics tonight for otherwise undifferentiated bacterial infection with urine pending in pt w/significant healthcare associated risk factors.  - Monitor blood cultures.  - Trend inflammatory markers. Reordered so labs carry over.  - Add on cortisol in AM given hypoglycemia, hypotension. - Pt is on therapeutic anticoagulation, INR therapeutic.  - Will defer decision Re: remdesivir, off-label actemra, or steroids at this time.  AKI on stage IV CKD: Do suspect some prerenal insult, though with concern for CHF and improved BP, will cut IVFs to 50cc/hr, added D5 as below due to hypoglycemia.  - Pt incontinent, purewick in place, UA collection is pending.  HFpEF: BNP 442.7 - Cut IVF's, run at 50cc/hr overnight with anticipated stopping tomorrow morning.  - Trend troponin   T2DM with hypoglycemia:  - D50 now, add D5 to IVF and give D50 pushes as needed. Pt able to take po, but tired and not likely to take any po overnight.  Otherwise, per H&P from earlier this evening. H&P mentions MOST form with patient requesting no mechanical ventilation, though she is listed as full code per discussion  with daughter. This will need to be revisited in an ongoing basis as intubation is unlikely to meaningfully change disease course were she to decompensate.  Patrecia Pour, MD Pager [phone] 02/20/2019, 11:13 PM

## 2019-02-22 NOTE — ED Notes (Signed)
Fluid tolerated without incident

## 2019-02-22 NOTE — Progress Notes (Signed)
Pharmacy Antibiotic Note  [name] [name] is a 85 y.o. female admitted on 01/29/2019 with concerns for sepsis. Patient is COVID positive. Pharmacy has been consulted for vancomycin/cefepime dosing.Patient is afebrile, WBC 4.7, LA 2.2 > 1.1. Scr 3.93, unclear baseline (lowest 1.27-1.3). Patient on levaquin PTA 5/16 - 5/24. Started on doxycycline yesterday.   Vancomycin 750 mg IV Q 48 hrs. Goal AUC 400-550. Expected AUC: 434 SCr used: 3.93  Plan: Vancomycin 1750 mg IV x1 then 750 mg IV q48h Cefepime 2g IV q24h Monitor clinical status, renal function, cultures, length of therapy Monitor vancomycin levels as indicated     Temp (24hrs), Avg:96 F (35.6 C), Min:94.4 F (34.7 C), Max:97.5 F (36.4 C)  Recent Labs  Lab 02/19/2019 1322 02/06/2019 1522  WBC 4.7  --   CREATININE 3.93*  --   LATICACIDVEN 2.2* 1.1    CrCl cannot be calculated (Unknown ideal weight.).    Allergies  Allergen Reactions  . Amiodarone Shortness Of Breath and Nausea Only    "Toxicity," also  . Flecainide Shortness Of Breath and Other (See Comments)    "Toxicity," also   . Morphine And Related Nausea And Vomiting and Other (See Comments)    Family requests no morphine due to past reaction. Pt stated it made her sick, severe nausea and vomiting  . Bactrim Ds [Sulfamethoxazole-Trimethoprim] Diarrhea  . Cephalexin Nausea And Vomiting  . Pineapple Itching    Severe itching    Antimicrobials this admission: Cefepime 5/25 >> Vancomycin 5/25 >> Flagyl 5/25 x1  Dose adjustments this admission:   Microbiology results: 5/25 UCx: 5/25 COVID: positive 5/25 Bcx:  Thank you for allowing pharmacy to be a part of this patient's care.  Claiborne Billings, PharmD PGY2 Cardiology Pharmacy Resident Please check AMION for all Pharmacist numbers by unit 02/21/2019 7:31 PM

## 2019-02-22 NOTE — Progress Notes (Signed)
Called daughter, Doristine [place], at [phone] to give her an update on her Mom and to let her know that she is now here at [facility]. Also, stated that she is resting comfortably in this her room. I asked her if she had any questions. She stated she didn't have any questions but she really appreciated the care we are giving her Mom.

## 2019-02-22 NOTE — ED Notes (Signed)
Date and time results received: 02/03/2019 1420  (use smartphrase ".now" to insert current time)  Test:lactic acid` Critical Value: 2.2  Name of Provider Notified: Dr. Ashok Cordia  Orders Received? Or Actions Taken?: .Marland Kitchen

## 2019-02-22 NOTE — ED Provider Notes (Signed)
[facility] EMERGENCY DEPARTMENT Provider Note   CSN: [id number] Arrival date & time: 02/20/2019  1310    History   Chief Complaint Chief Complaint  Patient presents with   low oxygen   lethargic    HPI [name] [name] is a 85 y.o. female.     Per EMS report, pt from [facility] known covid+ patient, with increased generalized weakness, poor po intake, and increased o2 requirement. Patient limited/poor historian, hx dementia - level 5 caveat. Pt denies specific pain or new c/o. Patient has MOST form, which specifies CPR, and also says no intubation/mechanical ventilation.  EMS notes BP low, in 80's.   The history is provided by the patient, the EMS personnel and the nursing home. The history is limited by the condition of the patient.    Past Medical History:  Diagnosis Date   Acute gastric ulcer without mention of hemorrhage, perforation, or obstruction 2006   EGD   Anxiety    Atrial fibrillation (HCC)    flecainide; coumadin   Breast cancer ([place])    left   Carotid artery disease ([place])    09/23: RICA 3-00%, LICA 76-22%  //  [place] 63/33: RICA 5-45%; LICA 62-56% >> FU 1 year    Chronic diastolic heart failure (HCC)    a. echo 10/10: mild LVH, EF 55-60%, mild AI, mild MR, mild to mod LAE, mild RVE, severe RAE, mod TR, PASP 53  /  b.  Echo 3/14:  Mild LVH, EF 55%, Tr AI, MAC, mild MR, mod LAE, PASP 31, trivial eff. // c. echo 1/17: EF 55-60%, mild AI, MAC, mild MR, moderate BAE, moderate TR   Chronic lower back pain    Dementia without behavioral disturbance ([place]) 11/24/2018   Diabetic peripheral neuropathy ([place]) 03/09/2015   Diverticulosis    Esophagitis, unspecified 2006   EGD   Hiatal hernia 2006   EGD   History of nuclear stress test    a. Myoview 2/12: EF 69%, no scar or ischemia   Hx of adenomatous colonic polyps    Hypertension    Mediastinal lymphadenopathy    Memory difficulty 11/14/2016   Mitral regurgitation     mild, echo, October, 2010   Osteopenia    Presence of permanent cardiac pacemaker    Pulmonary embolus ([place]) 2006   2006, with DVT   Pulmonary HTN ([place])    53 mmHg echo, 2010, moderate TR, mild right ventricular enlargement   Tachy-brady syndrome ([place])    s/p pacer 07/2009   Thyroid nodule    non-neoplastic goiter   Type II diabetes mellitus ([place])    Venous insufficiency    Chronic    Patient Active Problem List   Diagnosis Date Noted   Dementia without behavioral disturbance ([place]) 11/24/2018   FTT (failure to thrive) in adult 11/24/2018   AKI (acute kidney injury) ([place]) 11/23/2018   Pressure injury of skin 10/24/2018   Palliative care by specialist    Fall at home, initial encounter 10/18/2018   Pulmonary embolism on long-term anticoagulation therapy ([place]) 10/18/2018   Syncope and collapse 10/18/2018   Weakness 09/03/2018   Pruritus 05/08/2018   Morbid obesity due to excess calories ([place]) complicated by hbp 38/93/7342   Solitary pulmonary nodule on lung CT 03/21/2017   Amiodarone toxicity 03/21/2017   Pacemaker complications 87/68/1157   Recurrent falls 01/13/2017   Cognitive change    A-fib ([place]) 10/14/2016   Dysuria 10/10/2016   Chronic back pain 07/28/2016  Diabetes mellitus type 2, noninsulin dependent ([place])    CKD (chronic kidney disease), stage III ([place]) 04/12/2016   Dyspnea on exertion 04/12/2016   Hypertensive heart disease 03/28/2016   Anemia, iron deficiency 10/22/2015   Diabetic peripheral neuropathy ([place]) 03/09/2015   Chronic diastolic (congestive) heart failure ([place]) 09/07/2014   Sick sinus syndrome ([place]) 03/09/2014   Goals of care, counseling/discussion 11/04/2013   Hypertension 06/21/2013   Osteopenia    Pulmonary HTN ([place])    History of pulmonary embolism    Carotid artery disease ([place])    History of breast cancer    Pacemaker-Medtronic    Syncope    Mitral regurgitation    Mediastinal  lymphadenopathy     Past Surgical History:  Procedure Laterality Date   ABDOMINAL HYSTERECTOMY     ANKLE FRACTURE SURGERY Right    BACK SURGERY     BREAST LUMPECTOMY Left    FRACTURE SURGERY     INSERT / REPLACE / REMOVE PACEMAKER  06/30/2009   MDT Adalpta L implanted by Dr Olevia Perches   LUMBAR LAMINECTOMY  1973, 06/22/2011   "no hardware either time"     OB History   No obstetric history on file.      Home Medications    Prior to Admission medications   Medication Sig Start Date End Date Taking? Authorizing Provider  acetaminophen (TYLENOL) 325 MG tablet Take 650 mg by mouth every 6 (six) hours as needed (back pain).     [provider]  bethanechol (URECHOLINE) 25 MG tablet Take 25 mg by mouth 2 (two) times daily.    [provider]  blood glucose meter kit and supplies KIT Dispense based on patient and insurance preference. Use before breakfast and at bedtime. Diagnosis code: E11.9 03/26/17   Nche, Charlene Brooke, NP  diltiazem (CARDIZEM CD) 120 MG 24 hr capsule Take 1 capsule (120 mg total) by mouth daily. 10/28/18   Mariel Aloe, MD  dimethicone 1 % cream Apply topically 2 (two) times daily as needed for dry skin. Patient not taking: Reported on 11/23/2018 10/27/18   Mariel Aloe, MD  feeding supplement, ENSURE ENLIVE, (ENSURE ENLIVE) LIQD Take 237 mLs by mouth 2 (two) times daily between meals. Patient not taking: Reported on 11/23/2018 10/28/18   Mariel Aloe, MD  furosemide (LASIX) 80 MG tablet TAKE 1 TABLET (80 MG TOTAL) BY MOUTH 2 (TWO) TIMES DAILY. Patient not taking: Reported on 11/23/2018 01/13/18   Isaiah Serge, NP  insulin aspart (NOVOLOG) 100 UNIT/ML injection Inject 0-9 Units into the skin 3 (three) times daily with meals. 11/25/18   Terrilee Croak, MD  liver oil-zinc oxide (DESITIN) 40 % ointment Apply topically 6 (six) times daily. Apply to buttocks, labia, inner upper thighs, upper posterior thighs 10/27/18   Mariel Aloe, MD  memantine  (NAMENDA) 5 MG tablet Take 1 tablet (5 mg total) by mouth 2 (two) times daily. 07/23/18   Kathrynn Ducking, MD  metoprolol succinate (TOPROL-XL) 100 MG 24 hr tablet Take 1 tablet (100 mg total) by mouth daily. Take with or immediately following a meal. 10/28/18   Mariel Aloe, MD  Multiple Vitamin (MULTIVITAMIN WITH MINERALS) TABS tablet Take 1 tablet by mouth daily. 10/28/18   Mariel Aloe, MD  nystatin cream (MYCOSTATIN) Apply topically 2 (two) times daily. Apply to bilateral groins Patient not taking: Reported on 11/23/2018 10/27/18   Mariel Aloe, MD  omeprazole (PRILOSEC) 20 MG capsule Take 20 mg by mouth daily.  [provider]  ondansetron (ZOFRAN) 4 MG tablet Take 4 mg by mouth every 6 (six) hours as needed for nausea or vomiting.    [provider]  polyethylene glycol (MIRALAX / GLYCOLAX) packet Take 17 g by mouth daily as needed for mild constipation.    [provider]  potassium chloride SA (K-DUR,KLOR-CON) 20 MEQ tablet Take 20 mEq by mouth daily.    [provider]  pregabalin (LYRICA) 75 MG capsule Take 1 capsule (75 mg total) by mouth at bedtime. 08/12/18   Kathrynn Ducking, MD  promethazine (PHENERGAN) 25 MG tablet Take 1 tablet (25 mg total) by mouth every 8 (eight) hours as needed for nausea or vomiting. Patient not taking: Reported on 11/23/2018 10/22/16   Hoyt Koch, MD  senna-docusate (SENOKOT-S) 8.6-50 MG tablet Take 1 tablet by mouth at bedtime as needed for mild constipation. 10/27/18   Mariel Aloe, MD  vitamin B-12 (CYANOCOBALAMIN) 500 MCG tablet Take 1,000 mcg by mouth daily.     [provider]  warfarin (COUMADIN) 2.5 MG tablet TAKE 1/2 TABLET TO 1 TABLET DAILY OR AS DIRECTED BY COUMADIN CLINIC Patient taking differently: 2-3 mg. Per discussion with RN at facility, pt receives a dose of 2-3 mg PO daily (no set maintenance dose reported). Last dose PTA 1.5 mg on 2/23 06/30/18   Dorothy Spark, MD     Family History Family History  Problem Relation Age of Onset   Heart disease Mother    Hypertension Mother    COPD Father    Hypertension Father    Hypertension Sister    Hypertension Brother    Hypertension Daughter    Colon cancer Neg Hx    Heart attack Neg Hx    Stroke Neg Hx     Social History Social History   Tobacco Use   Smoking status: Former Smoker    Packs/day: 1.00    Years: 47.00    Pack years: 47.00    Types: Cigarettes    Last attempt to quit: 10/26/1998    Years since quitting: 20.3   Smokeless tobacco: Never Used  Substance Use Topics   Alcohol use: No    Comment: 10/14/2016 "I'll have a beer q once in awhile"   Drug use: No     Allergies   Amiodarone; Flecainide; Morphine and related; Bactrim ds [sulfamethoxazole-trimethoprim]; Cephalexin; and Pineapple   Review of Systems Review of Systems  Unable to perform ROS: Dementia  Constitutional: Positive for fever.  Respiratory: Positive for cough.   Neurological: Positive for weakness.  level 5 caveat - dementia   Physical Exam Updated Vital Signs BP (!) 93/56    Pulse 69    Resp 18    SpO2 91%   Physical Exam Vitals signs and nursing note reviewed.  Constitutional:      Appearance: Normal appearance. She is well-developed.  HENT:     Head: Atraumatic.     Nose: Nose normal.     Mouth/Throat:     Mouth: Mucous membranes are moist.  Eyes:     General: No scleral icterus.    Conjunctiva/sclera: Conjunctivae normal.     Pupils: Pupils are equal, round, and reactive to light.  Neck:     Musculoskeletal: Normal range of motion and neck supple. No neck rigidity or muscular tenderness.     Trachea: No tracheal deviation.  Cardiovascular:     Rate and Rhythm: Normal rate and regular rhythm.     Pulses:  Normal pulses.     Heart sounds: Normal heart sounds. No murmur. No friction rub. No gallop.   Pulmonary:     Effort: Pulmonary effort is normal. No respiratory distress.      Breath sounds: Normal breath sounds.  Abdominal:     General: Bowel sounds are normal. There is no distension.     Palpations: Abdomen is soft.     Tenderness: There is no abdominal tenderness. There is no guarding.  Genitourinary:    Comments: No cva tenderness.  Musculoskeletal:        General: No swelling.  Skin:    General: Skin is warm and dry.     Findings: No rash.  Neurological:     Mental Status: She is alert.     Comments: Alert, speech normal.   Psychiatric:        Mood and Affect: Mood normal.      ED Treatments / Results  Labs (all labs ordered are listed, but only abnormal results are displayed) Results for orders placed or performed during the hospital encounter of 02/07/2019  SARS Coronavirus 2 (CEPHEID- Performed in [place] hospital lab), [facility] Order  Result Value Ref Range   SARS Coronavirus 2 POSITIVE (A) NEGATIVE  Lactic acid, plasma  Result Value Ref Range   Lactic Acid, Venous 2.2 (HH) 0.5 - 1.9 mmol/L  CBC WITH DIFFERENTIAL  Result Value Ref Range   WBC 4.7 4.0 - 10.5 K/uL   RBC 5.49 (H) 3.87 - 5.11 MIL/uL   Hemoglobin 14.4 12.0 - 15.0 g/dL   HCT 47.0 (H) 36.0 - 46.0 %   MCV 85.6 80.0 - 100.0 fL   MCH 26.2 26.0 - 34.0 pg   MCHC 30.6 30.0 - 36.0 g/dL   RDW 16.4 (H) 11.5 - 15.5 %   Platelets 198 150 - 400 K/uL   nRBC 0.0 0.0 - 0.2 %   Neutrophils Relative % 87 %   Neutro Abs 4.1 1.7 - 7.7 K/uL   Lymphocytes Relative 8 %   Lymphs Abs 0.4 (L) 0.7 - 4.0 K/uL   Monocytes Relative 3 %   Monocytes Absolute 0.2 0.1 - 1.0 K/uL   Eosinophils Relative 0 %   Eosinophils Absolute 0.0 0.0 - 0.5 K/uL   Basophils Relative 0 %   Basophils Absolute 0.0 0.0 - 0.1 K/uL   Immature Granulocytes 2 %   Abs Immature Granulocytes 0.09 (H) 0.00 - 0.07 K/uL  Comprehensive metabolic panel  Result Value Ref Range   Sodium 141 135 - 145 mmol/L   Potassium 4.4 3.5 - 5.1 mmol/L   Chloride 106 98 - 111 mmol/L   CO2 20 (L) 22 - 32 mmol/L   Glucose, Bld 97 70 - 99  mg/dL   BUN 79 (H) 8 - 23 mg/dL   Creatinine, Ser 3.93 (H) 0.44 - 1.00 mg/dL   Calcium 8.4 (L) 8.9 - 10.3 mg/dL   Total Protein 7.0 6.5 - 8.1 g/dL   Albumin 2.1 (L) 3.5 - 5.0 g/dL   AST 37 15 - 41 U/L   ALT 11 0 - 44 U/L   Alkaline Phosphatase 125 38 - 126 U/L   Total Bilirubin 1.2 0.3 - 1.2 mg/dL   GFR calc non Af Amer 10 (L) >60 mL/min   GFR calc Af Amer 11 (L) >60 mL/min   Anion gap 15 5 - 15  Protime-INR  Result Value Ref Range   Prothrombin Time 29.1 (H) 11.4 - 15.2 seconds   INR  2.8 (H) 0.8 - 1.2  CBG monitoring, ED  Result Value Ref Range   Glucose-Capillary 93 70 - 99 mg/dL  I-stat troponin, ED  Result Value Ref Range   Troponin i, poc 0.04 0.00 - 0.08 ng/mL   Comment 3           Dg Chest Port 1 View  Result Date: 02/21/2019 CLINICAL DATA:  Low O2 sats.  Shortness of breath. EXAM: PORTABLE CHEST 1 VIEW COMPARISON:  10/18/2018 FINDINGS: Patient is rotated to the right displacing cardiomediastinal anatomy into the medial right hemithorax. Interstitial markings are diffusely coarsened with chronic features. The cardio pericardial silhouette is enlarged. Permanent pacemaker noted. Bones are diffusely demineralized. IMPRESSION: Cardiomegaly with underlying diffuse chronic interstitial lung disease. Interstitial markings are more prominent today raising the question of interstitial edema although this may be related to positioning. No substantial pleural effusion. Electronically Signed   By: Misty Stanley M.D.   On: 02/19/2019 14:13    EKG EKG Interpretation  Date/Time:  Monday Feb 22 2019 13:28:03 EDT Ventricular Rate:  86 PR Interval:    QRS Duration: 99 QT Interval:  425 QTC Calculation: 509 R Axis:   57 Text Interpretation:  Atrial fibrillation Nonspecific T wave abnormality Prolonged QT interval Confirmed by Lajean Saver [phone]) on 02/15/2019 1:39:37 PM   Radiology Dg Chest Port 1 View  Result Date: 02/05/2019 CLINICAL DATA:  Low O2 sats.  Shortness of breath.  EXAM: PORTABLE CHEST 1 VIEW COMPARISON:  10/18/2018 FINDINGS: Patient is rotated to the right displacing cardiomediastinal anatomy into the medial right hemithorax. Interstitial markings are diffusely coarsened with chronic features. The cardio pericardial silhouette is enlarged. Permanent pacemaker noted. Bones are diffusely demineralized. IMPRESSION: Cardiomegaly with underlying diffuse chronic interstitial lung disease. Interstitial markings are more prominent today raising the question of interstitial edema although this may be related to positioning. No substantial pleural effusion. Electronically Signed   By: Misty Stanley M.D.   On: 02/15/2019 14:13    Procedures Procedures (including critical care time)  Medications Ordered in ED Medications - No data to display   Initial Impression / Assessment and Plan / ED Course  I have reviewed the triage vital signs and the nursing notes.  Pertinent labs & imaging results that were available during my care of the patient were reviewed by me and considered in my medical decision making (see chart for details).  Iv ns bolus.   Stat labs and cxr.   Reviewed nursing notes and prior charts for additional history.   [name] [name] was evaluated in Emergency Department on 02/04/2019 for the symptoms described in the history of present illness. She was evaluated in the context of the global COVID-19 pandemic, which necessitated consideration that the patient might be at risk for infection with the SARS-CoV-2 virus that causes COVID-19. Institutional protocols and algorithms that pertain to the evaluation of patients at risk for COVID-19 are in a state of rapid change based on information released by regulatory bodies including the CDC and federal and state organizations. These policies and algorithms were followed during the patient's care in the ED.  On 2 liters o2 sat 88-90%.  On 2-4 liters, sats 92.  pts MOST form indicates 'hospital admission OK,  no ICU care, No intubation/mech vent, meds/ivf/etc ok.   Pt hypotensive, initial lactate low. Given no intubation/vent request, will be judicious with fluids resuscitation.  After first liter, bp 88/50. Pt conscious and alert. 2nd liter ns bolus.   CRITICAL CARE RE: respiratory failure,  COVID, hypotension, elevated lactate, AKI, dehydration, generalized weakness.  Performed by: Mirna Mires Total critical care time: 45 minutes Critical care time was exclusive of separately billable procedures and treating other patients. Critical care was necessary to treat or prevent imminent or life-threatening deterioration. Critical care was time spent personally by me on the following activities: development of treatment plan with patient and/or surrogate as well as nursing, discussions with consultants, evaluation of patient's response to treatment, examination of patient, obtaining history from patient or surrogate, ordering and performing treatments and interventions, ordering and review of laboratory studies, ordering and review of radiographic studies, pulse oximetry and re-evaluation of patient's condition.  Discussed pt with Hospitalists, Dr Tamala Julian - will admit.      Final Clinical Impressions(s) / ED Diagnoses   Final diagnoses:  None    ED Discharge Orders    None       Lajean Saver, MD 02/23/2019 1506

## 2019-02-22 NOTE — H&P (Addendum)
History and Physical    [name] [name] IZT:[id number] DOB: 1931-08-09 DOA: 02/07/2019  Referring MD/NP/PA: Lajean Saver, MD PCP: Hoyt Koch, MD  Patient coming from: [facility] via EMS  Chief Complaint: Low oxygenation  I have personally briefly reviewed patient's old medical records in [place]   HPI: [name] [name] is a 85 y.o. female with medical history significant of A. fib, DVT/PE on Coumadin, tachy-brady  syndrome s/p PPM, breast cancer s/p mastectomy, chronic diastolic heart failure, DM type II, PAH, CKD stage IV, and frequent UTIs; who presents with reports of low oxygenation from [facility].  Patient has severe dementia per family and is unable to give any significant history.  The patient denies having any complaints except for some mild shortness of breath.  Daughter makes note that on 5/15, she was told she had bilateral pneumonia and possible UTI.  Later noted to have tested positive for Covid-19.  They have been treating the patient at the facility and she had been requiring approximately 2 L nasal cannula oxygen.  However, has recently been more lethargic and now requiring 4 to 5 L of nasal cannula oxygen to maintain O2 saturations.   ED Course: Upon admission into the emergency department patient was noted to be hypothermic with initial temperature 94.4 F, pulse 55 and 89, blood pressure 78/59 improved to 115/50 with IV fluids, and O2 saturations 89 to 93% on 3 L of nasal cannula oxygen.  Labs revealed WBC 4.7, lactic acid 2.2, BUN 79, and creatinine 3.93.  Patient was given 500 mL normal saline IV fluids.  Patient has most form at bedside which states no mechanical intubation, but okay for all other resuscitative efforts. Daughter states that she requested patient be a full code.  Review of Systems  Unable to perform ROS: Dementia  Respiratory: Positive for shortness of breath.     Past Medical History:  Diagnosis Date  . Acute gastric ulcer  without mention of hemorrhage, perforation, or obstruction 2006   EGD  . Anxiety   . Atrial fibrillation (HCC)    flecainide; coumadin  . Breast cancer ([place])    left  . Carotid artery disease ([place])    38/25: RICA 0-53%, LICA 97-67%  //  [place] 34/19: RICA 3-79%; LICA 02-40% >> FU 1 year   . CHF (congestive heart failure) ([place])   . Chronic diastolic heart failure (HCC)    a. echo 10/10: mild LVH, EF 55-60%, mild AI, mild MR, mild to mod LAE, mild RVE, severe RAE, mod TR, PASP 53  /  b.  Echo 3/14:  Mild LVH, EF 55%, Tr AI, MAC, mild MR, mod LAE, PASP 31, trivial eff. // c. echo 1/17: EF 55-60%, mild AI, MAC, mild MR, moderate BAE, moderate TR  . Chronic lower back pain   . Dementia without behavioral disturbance ([place]) 11/24/2018  . Diabetic peripheral neuropathy ([place]) 03/09/2015  . Diverticulosis   . Esophagitis, unspecified 2006   EGD  . Hiatal hernia 2006   EGD  . History of nuclear stress test    a. Myoview 2/12: EF 69%, no scar or ischemia  . Hx of adenomatous colonic polyps   . Hypertension   . Mediastinal lymphadenopathy   . Memory difficulty 11/14/2016  . Mitral regurgitation    mild, echo, October, 2010  . Osteopenia   . Presence of permanent cardiac pacemaker   . Pulmonary embolus [facility]) 2006   2006, with DVT  . Pulmonary HTN ([place])  53 mmHg echo, 2010, moderate TR, mild right ventricular enlargement  . Tachy-brady syndrome ([place])    s/p pacer 07/2009  . Thyroid nodule    non-neoplastic goiter  . Type II diabetes mellitus ([place])   . Venous insufficiency    Chronic    Past Surgical History:  Procedure Laterality Date  . ABDOMINAL HYSTERECTOMY    . ANKLE FRACTURE SURGERY Right   . BACK SURGERY    . BREAST LUMPECTOMY Left   . FRACTURE SURGERY    . INSERT / REPLACE / REMOVE PACEMAKER  06/30/2009   MDT Adalpta L implanted by Dr Olevia Perches  . LUMBAR LAMINECTOMY  1973, 06/22/2011   "no hardware either time"     reports that she quit smoking about 20 years ago. Her  smoking use included cigarettes. She has a 47.00 pack-year smoking history. She has never used smokeless tobacco. She reports that she does not drink alcohol or use drugs.  Allergies  Allergen Reactions  . Amiodarone Shortness Of Breath and Nausea Only    "Toxicity," also  . Flecainide Shortness Of Breath and Other (See Comments)    "Toxicity," also   . Morphine And Related Nausea And Vomiting and Other (See Comments)    Family requests no morphine due to past reaction. Pt stated it made her sick, severe nausea and vomiting  . Bactrim Ds [Sulfamethoxazole-Trimethoprim] Diarrhea  . Cephalexin Nausea And Vomiting  . Pineapple Itching    Severe itching    Family History  Problem Relation Age of Onset  . Heart disease Mother   . Hypertension Mother   . COPD Father   . Hypertension Father   . Hypertension Sister   . Hypertension Brother   . Hypertension Daughter   . Colon cancer Neg Hx   . Heart attack Neg Hx   . Stroke Neg Hx     Prior to Admission medications   Medication Sig Start Date End Date Taking? Authorizing Provider  acetaminophen (TYLENOL) 325 MG tablet Take 650 mg by mouth every 6 (six) hours as needed (back pain).     [provider]  bethanechol (URECHOLINE) 25 MG tablet Take 25 mg by mouth 2 (two) times daily.    [provider]  blood glucose meter kit and supplies KIT Dispense based on patient and insurance preference. Use before breakfast and at bedtime. Diagnosis code: E11.9 03/26/17   Nche, Charlene Brooke, NP  diltiazem (CARDIZEM CD) 120 MG 24 hr capsule Take 1 capsule (120 mg total) by mouth daily. 10/28/18   Mariel Aloe, MD  dimethicone 1 % cream Apply topically 2 (two) times daily as needed for dry skin. Patient not taking: Reported on 11/23/2018 10/27/18   Mariel Aloe, MD  feeding supplement, ENSURE ENLIVE, (ENSURE ENLIVE) LIQD Take 237 mLs by mouth 2 (two) times daily between meals. Patient not taking: Reported on 11/23/2018 10/28/18    Mariel Aloe, MD  furosemide (LASIX) 80 MG tablet TAKE 1 TABLET (80 MG TOTAL) BY MOUTH 2 (TWO) TIMES DAILY. Patient not taking: Reported on 11/23/2018 01/13/18   Isaiah Serge, NP  insulin aspart (NOVOLOG) 100 UNIT/ML injection Inject 0-9 Units into the skin 3 (three) times daily with meals. 11/25/18   Terrilee Croak, MD  liver oil-zinc oxide (DESITIN) 40 % ointment Apply topically 6 (six) times daily. Apply to buttocks, labia, inner upper thighs, upper posterior thighs 10/27/18   Mariel Aloe, MD  memantine (NAMENDA) 5 MG tablet Take 1 tablet (5 mg total)  by mouth 2 (two) times daily. 07/23/18   Kathrynn Ducking, MD  metoprolol succinate (TOPROL-XL) 100 MG 24 hr tablet Take 1 tablet (100 mg total) by mouth daily. Take with or immediately following a meal. 10/28/18   Mariel Aloe, MD  Multiple Vitamin (MULTIVITAMIN WITH MINERALS) TABS tablet Take 1 tablet by mouth daily. 10/28/18   Mariel Aloe, MD  nystatin cream (MYCOSTATIN) Apply topically 2 (two) times daily. Apply to bilateral groins Patient not taking: Reported on 11/23/2018 10/27/18   Mariel Aloe, MD  omeprazole (PRILOSEC) 20 MG capsule Take 20 mg by mouth daily.    [provider]  ondansetron (ZOFRAN) 4 MG tablet Take 4 mg by mouth every 6 (six) hours as needed for nausea or vomiting.    [provider]  polyethylene glycol (MIRALAX / GLYCOLAX) packet Take 17 g by mouth daily as needed for mild constipation.    [provider]  potassium chloride SA (K-DUR,KLOR-CON) 20 MEQ tablet Take 20 mEq by mouth daily.    [provider]  pregabalin (LYRICA) 75 MG capsule Take 1 capsule (75 mg total) by mouth at bedtime. 08/12/18   Kathrynn Ducking, MD  promethazine (PHENERGAN) 25 MG tablet Take 1 tablet (25 mg total) by mouth every 8 (eight) hours as needed for nausea or vomiting. Patient not taking: Reported on 11/23/2018 10/22/16   Hoyt Koch, MD  senna-docusate (SENOKOT-S) 8.6-50 MG tablet  Take 1 tablet by mouth at bedtime as needed for mild constipation. 10/27/18   Mariel Aloe, MD  vitamin B-12 (CYANOCOBALAMIN) 500 MCG tablet Take 1,000 mcg by mouth daily.     [provider]  warfarin (COUMADIN) 2.5 MG tablet TAKE 1/2 TABLET TO 1 TABLET DAILY OR AS DIRECTED BY COUMADIN CLINIC Patient taking differently: 2-3 mg. Per discussion with RN at facility, pt receives a dose of 2-3 mg PO daily (no set maintenance dose reported). Last dose PTA 1.5 mg on 2/23 06/30/18   Dorothy Spark, MD    Physical Exam:  Constitutional: Elderly female in NAD, calm, comfortable Vitals:   02/01/2019 1445 02/19/2019 1500 02/11/2019 1522 02/07/2019 1530  BP: (!) 83/51 (!) 85/61 98/82 (!) 115/50  Pulse: 65 68 78   Resp: _0 Temp:      TempSrc:      SpO2: 92% (!) 89% 93%    Eyes: PERRL, lids and conjunctivae normal ENMT: Mucous membranes are moist. Posterior pharynx clear of any exudate or lesions. Neck: normal, supple, no masses, no thyromegaly Respiratory: clear to auscultation bilaterally, no wheezing, no crackles. Normal respiratory effort. No accessory muscle use. Currently on 4 L nasal cannula oxygen. Cardiovascular: Regular rate and rhythm, no murmurs / rubs / gallops. No extremity edema. 2+ pedal pulses. No carotid bruits.   Abdomen: no tenderness, no masses palpated. No hepatosplenomegaly. Bowel sounds positive.  Musculoskeletal: no clubbing / cyanosis. No joint deformity upper and lower extremities. Good ROM, no contractures. Normal muscle tone.  Skin: no rashes, lesions, ulcers. No induration Neurologic: CN 2-12 grossly intact. Sensation intact, DTR normal. Strength 5/5 in all 4.  Psychiatric: Alert, but not oriented.  Normal mood.     Labs on Admission: I have personally reviewed following labs and imaging studies  CBC: Recent Labs  Lab 02/08/2019 1322  WBC 4.7  NEUTROABS 4.1  HGB 14.4  HCT 47.0*  MCV 85.6  PLT 893   Basic Metabolic Panel: Recent Labs  Lab  01/31/2019 1322  NA 141  K 4.4  CL 106  CO2 20*  GLUCOSE 97  BUN 79*  CREATININE 3.93*  CALCIUM 8.4*   GFR: CrCl cannot be calculated (Unknown ideal weight.). Liver Function Tests: Recent Labs  Lab 02/21/2019 1322  AST 37  ALT 11  ALKPHOS 125  BILITOT 1.2  PROT 7.0  ALBUMIN 2.1*   No results for input(s): LIPASE, AMYLASE in the last 168 hours. No results for input(s): AMMONIA in the last 168 hours. Coagulation Profile: Recent Labs  Lab 02/12/2019 1322  INR 2.8*   Cardiac Enzymes: No results for input(s): CKTOTAL, CKMB, CKMBINDEX, TROPONINI in the last 168 hours. BNP (last 3 results) No results for input(s): PROBNP in the last 8760 hours. HbA1C: No results for input(s): HGBA1C in the last 72 hours. CBG: Recent Labs  Lab 02/18/2019 1313  GLUCAP 93   Lipid Profile: No results for input(s): CHOL, HDL, LDLCALC, TRIG, CHOLHDL, LDLDIRECT in the last 72 hours. Thyroid Function Tests: No results for input(s): TSH, T4TOTAL, FREET4, T3FREE, THYROIDAB in the last 72 hours. Anemia Panel: No results for input(s): VITAMINB12, FOLATE, FERRITIN, TIBC, IRON, RETICCTPCT in the last 72 hours. Urine analysis:    Component Value Date/Time   COLORURINE YELLOW 11/23/2018 1502   APPEARANCEUR CLOUDY (A) 11/23/2018 1502   APPEARANCEUR Clear 01/06/2017 1601   LABSPEC 1.009 11/23/2018 1502   LABSPEC 1.008 11/30/2011 0748   PHURINE 6.0 11/23/2018 1502   GLUCOSEU NEGATIVE 11/23/2018 1502   GLUCOSEU NEGATIVE 09/03/2018 1041   HGBUR MODERATE (A) 11/23/2018 1502   BILIRUBINUR NEGATIVE 11/23/2018 1502   BILIRUBINUR Neg 05/08/2018 1611   BILIRUBINUR Negative 01/06/2017 1601   BILIRUBINUR Negative 11/30/2011 0748   KETONESUR NEGATIVE 11/23/2018 1502   PROTEINUR NEGATIVE 11/23/2018 1502   UROBILINOGEN 1.0 09/03/2018 1041   NITRITE NEGATIVE 11/23/2018 1502   LEUKOCYTESUR LARGE (A) 11/23/2018 1502   LEUKOCYTESUR 3+ 11/30/2011 0748   Sepsis Labs: Recent Results (from the past 240 hour(s))   SARS Coronavirus 2 (CEPHEID- Performed in [place] hospital lab), Hosp Order     Status: Abnormal   Collection Time: 02/19/2019  1:40 PM  Result Value Ref Range Status   SARS Coronavirus 2 POSITIVE (A) NEGATIVE Final    Comment: RESULT CALLED TO, READ BACK BY AND VERIFIED WITH: M. Coffey RN 14:50 02/19/2019 ([email or web address]) (NOTE) If result is NEGATIVE SARS-CoV-2 target nucleic acids are NOT DETECTED. The SARS-CoV-2 RNA is generally detectable in upper and lower  respiratory specimens during the acute phase of infection. The lowest  concentration of SARS-CoV-2 viral copies this assay can detect is 250  copies / mL. A negative result does not preclude SARS-CoV-2 infection  and should not be used as the sole basis for treatment or other  patient management decisions.  A negative result may occur with  improper specimen collection / handling, submission of specimen other  than nasopharyngeal swab, presence of viral mutation(s) within the  areas targeted by this assay, and inadequate number of viral copies  (<250 copies / mL). A negative result must be combined with clinical  observations, patient history, and epidemiological information. If result is POSITIVE SARS-CoV-2 target nucleic acids are DETECTED.  The SARS-CoV-2 RNA is generally detectable in upper and lower  respiratory specimens during the acute phase of infection.  Positive  results are indicative of active infection with SARS-CoV-2.  Clinical  correlation with patient history and other diagnostic information is  necessary to determine patient infection status.  Positive results do  not rule out bacterial  infection or co-infection with other viruses. If result is PRESUMPTIVE POSTIVE SARS-CoV-2 nucleic acids MAY BE PRESENT.   A presumptive positive result was obtained on the submitted specimen  and confirmed on repeat testing.  While 2019 novel coronavirus  (SARS-CoV-2) nucleic acids may be present in the submitted sample  additional  confirmatory testing may be necessary for epidemiological  and / or clinical management purposes  to differentiate between  SARS-CoV-2 and other Sarbecovirus currently known to infect humans.  If clinically indicated additional testing with an alternate test  methodology [phone]) i s advised. The SARS-CoV-2 RNA is generally  detectable in upper and lower respiratory specimens during the acute  phase of infection. The expected result is Negative. Fact Sheet for Patients:  StrictlyIdeas.no Fact Sheet for Healthcare Providers: BankingDealers.co.za This test is not yet approved or cleared by the [place] FDA and has been authorized for detection and/or diagnosis of SARS-CoV-2 by FDA under an Emergency Use Authorization (EUA).  This EUA will remain in effect (meaning this test can be used) for the duration of the COVID-19 declaration under Section 564(b)(1) of the Act, 21 U.S.C. section 360bbb-3(b)(1), unless the authorization is terminated or revoked sooner. Performed at [facility], [place] [address], [place], [place] [address]      Radiological Exams on Admission: Dg Chest Port 1 View  Result Date: 02/24/2019 CLINICAL DATA:  Low O2 sats.  Shortness of breath. EXAM: PORTABLE CHEST 1 VIEW COMPARISON:  10/18/2018 FINDINGS: Patient is rotated to the right displacing cardiomediastinal anatomy into the medial right hemithorax. Interstitial markings are diffusely coarsened with chronic features. The cardio pericardial silhouette is enlarged. Permanent pacemaker noted. Bones are diffusely demineralized. IMPRESSION: Cardiomegaly with underlying diffuse chronic interstitial lung disease. Interstitial markings are more prominent today raising the question of interstitial edema although this may be related to positioning. No substantial pleural effusion. Electronically Signed   By: Misty Stanley M.D.   On: 02/13/2019 14:13    EKG: Independently  reviewed.  Atrial fibrillation at 86 bpm.  Assessment/Plan Acute respiratory failure with hypoxia, COVID-19 infection: Acute.  Patient presents with worsening hypoxia despite nasal cannula oxygen.  Diagnosed with COVID-19.  Chest x-ray notes interstitial lung disease concerning for possible fluid. -COVID-19 order set utilized -Follow-up blood cultures -Continuous pulse oximetry with nasal cannula oxygen to maintain O2 saturation -Albuterol inhaler   Lactic acidosis: Acute.  Admission lactic acid elevated at 2.2.  Given hypotension and hypothermia question possibility underlying infection.  Daughter reported that she was told that the patient had a UTI and  Pneumonia. -Follow-up blood cultures -Follow-up urinalysis. -Empiric antibiotics of vancomycin and cefepime  Hypotension: Acute.  On admission blood pressure noted to be as low as 78/59.  Blood pressures improved some with IV fluids.   -Continue IV fluids normal saline at 100 mL/h  -Hold metoprolol  Hypothermia: Acute.  On admission temperature 94.4 F -Place patient to bear hugger  Acute kidney injury superimposed on chronic kidney disease: Patient creatinine previously noted to be around 2.3 in February.  She presents with creatinine elevated up to 3.93 with BUN 79. -Strict intake and output -Follow-up urinalysis -IV fluids 100 mL/h as tolerated overnight -Hold nephrotoxic agents  Chronic atrial fibrillation on chronic anticoagulation: INR therapeutic at 2.8.  She appears currently rate controlled. -Continue diltiazem as tolerated -Coumadin per pharmacy   History of pulmonary embolus -Continue Coumadin  Diabetes mellitus type II: Last  hemoglobin A1c noted to be 6.3 on 10/14/2016. -Hypoglycemic protocol -CBGs q. AC  with sensitive SSI  Dementia: Family reports her dementia is severe. -Continue memantine  GERD -Substitution of Pepcid DVT prophylaxis: Coumadin Code Status: Daughter states patient to be full code Family  Communication: Discussed plan of care with the patient and Disposition Plan: To be determined Consults called: None Admission status: Inpatient  Norval Morton MD [facility] Pager [phone]   If 7PM-7AM, please contact night-coverage [email or web address] Password [facility]  02/05/2019, 4:13 PM

## 2019-02-22 NOTE — ED Triage Notes (Signed)
Pt arrive via ems from [facility] with what they report low oxygen saturation. Facility had her on 4-5l. Ems dropped her oxygen down to 2l and she was reading 100.

## 2019-02-22 NOTE — ED Notes (Signed)
ED TO INPATIENT HANDOFF REPORT  ED Nurse Name and Phone #: Ethelle Lyon, RN [phone]  S Name/Age/Gender [name] [name] 85 y.o. female Room/Bed: 027C/027C  Code Status   Code Status: Prior  Home/SNF/Other Nursing Home Patient oriented to: self Is this baseline? Yes   Triage Complete: Triage complete  Chief Complaint Covid  Triage Note Pt arrive via ems from [facility] with what they report low oxygen saturation. Facility had her on 4-5l. Ems dropped her oxygen down to 2l and she was reading 100.    Allergies Allergies  Allergen Reactions  . Amiodarone Shortness Of Breath and Nausea Only    "Toxicity," also  . Flecainide Shortness Of Breath and Other (See Comments)    "Toxicity," also   . Morphine And Related Nausea And Vomiting and Other (See Comments)    Family requests no morphine due to past reaction. Pt stated it made her sick, severe nausea and vomiting  . Bactrim Ds [Sulfamethoxazole-Trimethoprim] Diarrhea  . Cephalexin Nausea And Vomiting  . Pineapple Itching    Severe itching    Level of Care/Admitting Diagnosis ED Disposition    ED Disposition Condition Comment   Admit  The patient appears reasonably stabilized for admission considering the current resources, flow, and capabilities available in the ED at this time, and I doubt any other [facility] requiring further screening and/or treatment in the ED prior to admission is  present.       B Medical/Surgery History Past Medical History:  Diagnosis Date  . Acute gastric ulcer without mention of hemorrhage, perforation, or obstruction 2006   EGD  . Anxiety   . Atrial fibrillation (HCC)    flecainide; coumadin  . Breast cancer ([place])    left  . Carotid artery disease ([place])    84/13: RICA 2-44%, LICA 01-02%  //  Carotiud [place] 72/53: RICA 6-64%; LICA 40-34% >> FU 1 year   . CHF (congestive heart failure) ([place])   . Chronic diastolic heart failure (HCC)    a. echo 10/10: mild LVH, EF 55-60%, mild AI, mild MR,  mild to mod LAE, mild RVE, severe RAE, mod TR, PASP 53  /  b.  Echo 3/14:  Mild LVH, EF 55%, Tr AI, MAC, mild MR, mod LAE, PASP 31, trivial eff. // c. echo 1/17: EF 55-60%, mild AI, MAC, mild MR, moderate BAE, moderate TR  . Chronic lower back pain   . Dementia without behavioral disturbance ([place]) 11/24/2018  . Diabetic peripheral neuropathy ([place]) 03/09/2015  . Diverticulosis   . Esophagitis, unspecified 2006   EGD  . Hiatal hernia 2006   EGD  . History of nuclear stress test    a. Myoview 2/12: EF 69%, no scar or ischemia  . Hx of adenomatous colonic polyps   . Hypertension   . Mediastinal lymphadenopathy   . Memory difficulty 11/14/2016  . Mitral regurgitation    mild, echo, October, 2010  . Osteopenia   . Presence of permanent cardiac pacemaker   . Pulmonary embolus [facility]) 2006   2006, with DVT  . Pulmonary HTN ([place])    53 mmHg echo, 2010, moderate TR, mild right ventricular enlargement  . Tachy-brady syndrome ([place])    s/p pacer 07/2009  . Thyroid nodule    non-neoplastic goiter  . Type II diabetes mellitus ([place])   . Venous insufficiency    Chronic   Past Surgical History:  Procedure Laterality Date  . ABDOMINAL HYSTERECTOMY    . ANKLE FRACTURE SURGERY Right   .  BACK SURGERY    . BREAST LUMPECTOMY Left   . FRACTURE SURGERY    . INSERT / REPLACE / REMOVE PACEMAKER  06/30/2009   MDT Adalpta L implanted by Dr Olevia Perches  . LUMBAR LAMINECTOMY  1973, 06/22/2011   "no hardware either time"     A IV Location/Drains/Wounds Patient Lines/Drains/Airways Status   Active Line/Drains/Airways    Name:   Placement date:   Placement time:   Site:   Days:   Peripheral IV 10/18/18 Right Antecubital   10/18/18    1114    Antecubital   127   Peripheral IV 10/26/18 Right;Posterior Forearm   10/26/18    2118    Forearm   119   Urethral Catheter Sarah RN Double-lumen 16 Fr.   10/27/18    1600    Double-lumen   118   External Urinary Catheter   11/24/18    1325    -   90   Pressure Injury  10/22/18 Stage II -  Partial thickness loss of dermis presenting as a shallow open ulcer with a red, pink wound bed without slough. 100% pink   10/22/18    1149     123   Pressure Injury 10/22/18 Stage I -  Intact skin with non-blanchable redness of a localized area usually over a bony prominence. blister   10/22/18    -     123   Pressure Injury 10/22/18 Stage I -  Intact skin with non-blanchable redness of a localized area usually over a bony prominence.   10/22/18    1159     123   Pressure Injury 10/22/18 Stage I -  Intact skin with non-blanchable redness of a localized area usually over a bony prominence.    10/22/18    1201     123   Pressure Injury   -    -               Intake/Output Last 24 hours No intake or output data in the 24 hours ending 01/31/2019 1553  Labs/Imaging Results for orders placed or performed during the hospital encounter of 02/27/2019 (from the past 48 hour(s))  CBG monitoring, ED     Status: None   Collection Time: 01/29/2019  1:13 PM  Result Value Ref Range   Glucose-Capillary 93 70 - 99 mg/dL  Lactic acid, plasma     Status: Abnormal   Collection Time: 02/25/2019  1:22 PM  Result Value Ref Range   Lactic Acid, Venous 2.2 (HH) 0.5 - 1.9 mmol/L    Comment: CRITICAL RESULT CALLED TO, READ BACK BY AND VERIFIED WITHFrazier Richards RN [id number] BY A BENNETT Performed at [facility], [place] [address], [place], [place] [address]   CBC WITH DIFFERENTIAL     Status: Abnormal   Collection Time: 02/21/2019  1:22 PM  Result Value Ref Range   WBC 4.7 4.0 - 10.5 K/uL   RBC 5.49 (H) 3.87 - 5.11 MIL/uL   Hemoglobin 14.4 12.0 - 15.0 g/dL   HCT 47.0 (H) 36.0 - 46.0 %   MCV 85.6 80.0 - 100.0 fL   MCH 26.2 26.0 - 34.0 pg   MCHC 30.6 30.0 - 36.0 g/dL   RDW 16.4 (H) 11.5 - 15.5 %   Platelets 198 150 - 400 K/uL   nRBC 0.0 0.0 - 0.2 %   Neutrophils Relative % 87 %   Neutro Abs 4.1 1.7 - 7.7 K/uL   Lymphocytes  Relative 8 %   Lymphs Abs 0.4 (L) 0.7 - 4.0 K/uL   Monocytes  Relative 3 %   Monocytes Absolute 0.2 0.1 - 1.0 K/uL   Eosinophils Relative 0 %   Eosinophils Absolute 0.0 0.0 - 0.5 K/uL   Basophils Relative 0 %   Basophils Absolute 0.0 0.0 - 0.1 K/uL   Immature Granulocytes 2 %   Abs Immature Granulocytes 0.09 (H) 0.00 - 0.07 K/uL    Comment: Performed at [facility] [address] [place], [place] [address]  Comprehensive metabolic panel     Status: Abnormal   Collection Time: 02/20/2019  1:22 PM  Result Value Ref Range   Sodium 141 135 - 145 mmol/L   Potassium 4.4 3.5 - 5.1 mmol/L   Chloride 106 98 - 111 mmol/L   CO2 20 (L) 22 - 32 mmol/L   Glucose, Bld 97 70 - 99 mg/dL   BUN 79 (H) 8 - 23 mg/dL   Creatinine, Ser 3.93 (H) 0.44 - 1.00 mg/dL   Calcium 8.4 (L) 8.9 - 10.3 mg/dL   Total Protein 7.0 6.5 - 8.1 g/dL   Albumin 2.1 (L) 3.5 - 5.0 g/dL   AST 37 15 - 41 U/L   ALT 11 0 - 44 U/L   Alkaline Phosphatase 125 38 - 126 U/L   Total Bilirubin 1.2 0.3 - 1.2 mg/dL   GFR calc non Af Amer 10 (L) >60 mL/min   GFR calc Af Amer 11 (L) >60 mL/min   Anion gap 15 5 - 15    Comment: Performed at [facility], [place] [address] [place], [place] [address]  Protime-INR     Status: Abnormal   Collection Time: 02/18/2019  1:22 PM  Result Value Ref Range   Prothrombin Time 29.1 (H) 11.4 - 15.2 seconds   INR 2.8 (H) 0.8 - 1.2    Comment: (NOTE) INR goal varies based on device and disease states. Performed at [facility], [place] [address], [place],  [address]   SARS Coronavirus 2 (CEPHEID- Performed in [facility] hospital lab), Hosp Order     Status: Abnormal   Collection Time: 02/17/2019  1:40 PM  Result Value Ref Range   SARS Coronavirus 2 POSITIVE (A) NEGATIVE    Comment: RESULT CALLED TO, READ BACK BY AND VERIFIED WITH: M. Coffey RN 14:50 02/07/2019 ([email or web address]) (NOTE) If result is NEGATIVE SARS-CoV-2 target nucleic acids are NOT DETECTED. The SARS-CoV-2 RNA is generally detectable in upper and lower  respiratory specimens during the  acute phase of infection. The lowest  concentration of SARS-CoV-2 viral copies this assay can detect is 250  copies / mL. A negative result does not preclude SARS-CoV-2 infection  and should not be used as the sole basis for treatment or other  patient management decisions.  A negative result may occur with  improper specimen collection / handling, submission of specimen other  than nasopharyngeal swab, presence of viral mutation(s) within the  areas targeted by this assay, and inadequate number of viral copies  (<250 copies / mL). A negative result must be combined with clinical  observations, patient history, and epidemiological information. If result is POSITIVE SARS-CoV-2 target nucleic acids are DETECTED.  The SARS-CoV-2 RNA is generally detectable in upper and lower  respiratory specimens during the acute phase of infection.  Positive  results are indicative of active infection with SARS-CoV-2.  Clinical  correlation with patient history and other diagnostic information is  necessary  to determine patient infection status.  Positive results do  not rule out bacterial infection or co-infection with other viruses. If result is PRESUMPTIVE POSTIVE SARS-CoV-2 nucleic acids MAY BE PRESENT.   A presumptive positive result was obtained on the submitted specimen  and confirmed on repeat testing.  While 2019 novel coronavirus  (SARS-CoV-2) nucleic acids may be present in the submitted sample  additional confirmatory testing may be necessary for epidemiological  and / or clinical management purposes  to differentiate between  SARS-CoV-2 and other Sarbecovirus currently known to infect humans.  If clinically indicated additional testing with an alternate test  methodology [phone]) i s advised. The SARS-CoV-2 RNA is generally  detectable in upper and lower respiratory specimens during the acute  phase of infection. The expected result is Negative. Fact Sheet for Patients:   StrictlyIdeas.no Fact Sheet for Healthcare Providers: BankingDealers.co.za This test is not yet approved or cleared by the [place] FDA and has been authorized for detection and/or diagnosis of SARS-CoV-2 by FDA under an Emergency Use Authorization (EUA).  This EUA will remain in effect (meaning this test can be used) for the duration of the COVID-19 declaration under Section 564(b)(1) of the Act, 21 U.S.C. section 360bbb-3(b)(1), unless the authorization is terminated or revoked sooner. Performed at [facility], [place] [address], [place], [place] [address]   I-stat troponin, ED     Status: None   Collection Time: 02/14/2019  1:56 PM  Result Value Ref Range   Troponin i, poc 0.04 0.00 - 0.08 ng/mL   Comment 3            Comment: Due to the release kinetics of cTnI, a negative result within the first hours of the onset of symptoms does not rule out myocardial infarction with certainty. If myocardial infarction is still suspected, repeat the test at appropriate intervals.   Lactic acid, plasma     Status: None   Collection Time: 02/03/2019  3:22 PM  Result Value Ref Range   Lactic Acid, Venous 1.1 0.5 - 1.9 mmol/L    Comment: Performed at [facility], [place] [address] [place], [place] [address]   Dg Chest Port 1 View  Result Date: 02/21/2019 CLINICAL DATA:  Low O2 sats.  Shortness of breath. EXAM: PORTABLE CHEST 1 VIEW COMPARISON:  10/18/2018 FINDINGS: Patient is rotated to the right displacing cardiomediastinal anatomy into the medial right hemithorax. Interstitial markings are diffusely coarsened with chronic features. The cardio pericardial silhouette is enlarged. Permanent pacemaker noted. Bones are diffusely demineralized. IMPRESSION: Cardiomegaly with underlying diffuse chronic interstitial lung disease. Interstitial markings are more prominent today raising the question of interstitial edema although this may be related  to positioning. No substantial pleural effusion. Electronically Signed   By: Misty Stanley M.D.   On: 02/05/2019 14:13    Pending Labs Unresulted Labs (From admission, onward)    Start     Ordered   02/05/2019 1324  Urinalysis, Routine w reflex microscopic  ONCE - STAT,   R     02/21/2019 1323   02/25/2019 1322  Blood Culture (routine x 2)  BLOOD CULTURE X 2,   STAT     01/30/2019 1323          Vitals/Pain Today's Vitals   02/07/2019 1445 02/13/2019 1500 02/05/2019 1522 02/07/2019 1530  BP: (!) 83/51 (!) 85/61 98/82 (!) 115/50  Pulse: 65 68 78   Resp: _0 Temp:      TempSrc:  SpO2: 92% (!) 89% 93%   PainSc:        Isolation Precautions Droplet and Contact precautions  Medications Medications  sodium chloride 0.9 % bolus 500 mL (0 mLs Intravenous Stopped 02/04/2019 1524)  sodium chloride 0.9 % bolus 500 mL (500 mLs Intravenous New Bag/Given 02/21/2019 1534)  sodium chloride 0.9 % bolus 1,000 mL (1,000 mLs Intravenous New Bag/Given 01/31/2019 1527)    Mobility manual wheelchair     Focused Assessments Cardiac Assessment Handoff:    Lab Results  Component Value Date   CKTOTAL 283 (H) 10/19/2018   CKMB 1.8 06/26/2011   TROPONINI <0.03 05/20/2018   Lab Results  Component Value Date   DDIMER <0.27 03/18/2017   Does the Patient currently have chest pain? No     R Recommendations: See Admitting Provider Note  Report given to:   Additional Notes: .

## 2019-02-23 ENCOUNTER — Other Ambulatory Visit: Payer: Self-pay

## 2019-02-23 LAB — COMPREHENSIVE METABOLIC PANEL
ALT: 11 U/L (ref 0–44)
AST: 28 U/L (ref 15–41)
Albumin: 2 g/dL — ABNORMAL LOW (ref 3.5–5.0)
Alkaline Phosphatase: 104 U/L (ref 38–126)
Anion gap: 12 (ref 5–15)
BUN: 76 mg/dL — ABNORMAL HIGH (ref 8–23)
CO2: 19 mmol/L — ABNORMAL LOW (ref 22–32)
Calcium: 8 mg/dL — ABNORMAL LOW (ref 8.9–10.3)
Chloride: 112 mmol/L — ABNORMAL HIGH (ref 98–111)
Creatinine, Ser: 3.61 mg/dL — ABNORMAL HIGH (ref 0.44–1.00)
GFR calc Af Amer: 13 mL/min — ABNORMAL LOW (ref >60)
GFR calc non Af Amer: 11 mL/min — ABNORMAL LOW (ref >60)
Glucose, Bld: 49 mg/dL — ABNORMAL LOW (ref 70–99)
Potassium: 4.2 mmol/L (ref 3.5–5.1)
Sodium: 143 mmol/L (ref 135–145)
Total Bilirubin: 0.8 mg/dL (ref 0.3–1.2)
Total Protein: 6.5 g/dL (ref 6.5–8.1)

## 2019-02-23 LAB — PROTEIN / CREATININE RATIO, URINE
Creatinine, Urine: 62.73 mg/dL
Protein Creatinine Ratio: 0.64 mg/mg{Cre} — ABNORMAL HIGH (ref 0.00–0.15)
Total Protein, Urine: 40 mg/dL

## 2019-02-23 LAB — URINALYSIS, ROUTINE W REFLEX MICROSCOPIC
Bilirubin Urine: NEGATIVE
Glucose, UA: NEGATIVE mg/dL
Ketones, ur: NEGATIVE mg/dL
Nitrite: NEGATIVE
Protein, ur: NEGATIVE mg/dL
Specific Gravity, Urine: 1.01 (ref 1.005–1.030)
WBC, UA: >50 WBC/hpf — ABNORMAL HIGH (ref 0–5)
pH: 6 (ref 5.0–8.0)

## 2019-02-23 LAB — GLUCOSE, CAPILLARY
Glucose-Capillary: 40 mg/dL — CL (ref 70–99)
Glucose-Capillary: 55 mg/dL — ABNORMAL LOW (ref 70–99)
Glucose-Capillary: 78 mg/dL (ref 70–99)
Glucose-Capillary: 104 mg/dL — ABNORMAL HIGH (ref 70–99)
Glucose-Capillary: 45 mg/dL — ABNORMAL LOW (ref 70–99)
Glucose-Capillary: 53 mg/dL — ABNORMAL LOW (ref 70–99)
Glucose-Capillary: 92 mg/dL (ref 70–99)
Glucose-Capillary: 82 mg/dL (ref 70–99)
Glucose-Capillary: 143 mg/dL — ABNORMAL HIGH (ref 70–99)

## 2019-02-23 LAB — CBC WITH DIFFERENTIAL/PLATELET
Abs Immature Granulocytes: 0.08 10*3/uL — ABNORMAL HIGH (ref 0.00–0.07)
Basophils Absolute: 0 10*3/uL (ref 0.0–0.1)
Basophils Relative: 0 %
Eosinophils Absolute: 0 10*3/uL (ref 0.0–0.5)
Eosinophils Relative: 0 %
HCT: 43.8 % (ref 36.0–46.0)
Hemoglobin: 13.2 g/dL (ref 12.0–15.0)
Immature Granulocytes: 2 %
Lymphocytes Relative: 9 %
Lymphs Abs: 0.4 10*3/uL — ABNORMAL LOW (ref 0.7–4.0)
MCH: 26.3 pg (ref 26.0–34.0)
MCHC: 30.1 g/dL (ref 30.0–36.0)
MCV: 87.3 fL (ref 80.0–100.0)
Monocytes Absolute: 0.1 10*3/uL (ref 0.1–1.0)
Monocytes Relative: 3 %
Neutro Abs: 4.2 10*3/uL (ref 1.7–7.7)
Neutrophils Relative %: 86 %
Platelets: 190 10*3/uL (ref 150–400)
RBC: 5.02 MIL/uL (ref 3.87–5.11)
RDW: 16.9 % — ABNORMAL HIGH (ref 11.5–15.5)
WBC: 4.8 10*3/uL (ref 4.0–10.5)
nRBC: 0 % (ref 0.0–0.2)

## 2019-02-23 LAB — CORTISOL-AM, BLOOD: Cortisol - AM: 20.6 ug/dL (ref 6.7–22.6)

## 2019-02-23 LAB — BRAIN NATRIURETIC PEPTIDE: B Natriuretic Peptide: 718.4 pg/mL — ABNORMAL HIGH (ref 0.0–100.0)

## 2019-02-23 LAB — PROTIME-INR
INR: 3.8 — ABNORMAL HIGH (ref 0.8–1.2)
Prothrombin Time: 37.1 seconds — ABNORMAL HIGH (ref 11.4–15.2)

## 2019-02-23 LAB — FERRITIN: Ferritin: 300 ng/mL (ref 11–307)

## 2019-02-23 LAB — SODIUM, URINE, RANDOM: Sodium, Ur: 48 mmol/L

## 2019-02-23 LAB — D-DIMER, QUANTITATIVE: D-Dimer, Quant: 1.78 ug/mL-FEU — ABNORMAL HIGH (ref 0.00–0.50)

## 2019-02-23 LAB — TRIGLYCERIDES: Triglycerides: 98 mg/dL (ref <150)

## 2019-02-23 LAB — C-REACTIVE PROTEIN: CRP: 16 mg/dL — ABNORMAL HIGH (ref <1.0)

## 2019-02-23 MED ORDER — MIRTAZAPINE 15 MG PO TBDP
15.0000 mg | ORAL_TABLET | Freq: Every day | ORAL | Status: DC
  Administered 2019-02-23: 22:00:00 15 mg via ORAL
  Filled 2019-02-23: qty 1

## 2019-02-23 MED ORDER — DEXTROSE 50 % IV SOLN
25.0000 g | INTRAVENOUS | Status: AC
  Administered 2019-02-23: 13:00:00 25 g via INTRAVENOUS
  Filled 2019-02-23: qty 50

## 2019-02-23 MED ORDER — PRO-STAT SUGAR FREE PO LIQD
30.0000 mL | Freq: Three times a day (TID) | ORAL | Status: DC
  Administered 2019-02-23 – 2019-02-24 (×2): 30 mL via ORAL
  Filled 2019-02-23 (×4): qty 30

## 2019-02-23 MED ORDER — FUROSEMIDE 10 MG/ML IJ SOLN
40.0000 mg | Freq: Once | INTRAMUSCULAR | Status: AC
  Administered 2019-02-23: 40 mg via INTRAVENOUS
  Filled 2019-02-23: qty 4

## 2019-02-23 MED ORDER — METHYLPREDNISOLONE SODIUM SUCC 125 MG IJ SOLR
60.0000 mg | Freq: Three times a day (TID) | INTRAMUSCULAR | Status: DC
  Administered 2019-02-23 – 2019-02-24 (×3): 60 mg via INTRAVENOUS
  Filled 2019-02-23 (×3): qty 2

## 2019-02-23 MED ORDER — METOPROLOL SUCCINATE ER 50 MG PO TB24
50.0000 mg | ORAL_TABLET | Freq: Every day | ORAL | Status: DC
  Administered 2019-02-24 – 2019-02-26 (×3): 50 mg via ORAL
  Filled 2019-02-23 (×3): qty 1

## 2019-02-23 MED ORDER — ORAL CARE MOUTH RINSE
15.0000 mL | Freq: Two times a day (BID) | OROMUCOSAL | Status: DC
  Administered 2019-02-24 – 2019-02-28 (×8): 15 mL via OROMUCOSAL

## 2019-02-23 MED ORDER — DEXTROSE 5 % IV SOLN
INTRAVENOUS | Status: DC
  Administered 2019-02-23: 15:00:00 via INTRAVENOUS

## 2019-02-23 MED ORDER — DEXTROSE 50 % IV SOLN
12.5000 g | INTRAVENOUS | Status: AC
  Administered 2019-02-23: 09:00:00 12.5 g via INTRAVENOUS

## 2019-02-23 NOTE — Progress Notes (Addendum)
BP soft 81/54 Map 63. Paged Dr. Bonner Puna. Dr. Bonner Puna stated to let him know if the map goes below 60. Will continue to monitor.

## 2019-02-23 NOTE — Progress Notes (Signed)
ANTICOAGULATION CONSULT NOTE - Initial Consult  Pharmacy Consult for Warfarin Indication: atrial fibrillation  Allergies  Allergen Reactions  . Amiodarone Shortness Of Breath and Nausea Only    "Toxicity," also  . Flecainide Shortness Of Breath and Other (See Comments)    "Toxicity," also   . Morphine And Related Nausea And Vomiting and Other (See Comments)    Family requests no morphine due to past reaction. Pt stated it made her sick, severe nausea and vomiting  . Bactrim Ds [Sulfamethoxazole-Trimethoprim] Diarrhea  . Cephalexin Nausea And Vomiting  . Pineapple Itching    Severe itching   Labs: Recent Labs    02/13/2019 1322 02/23/19 0538  HGB 14.4 13.2  HCT 47.0* 43.8  PLT 198 190  LABPROT 29.1* 37.1*  INR 2.8* 3.8*  CREATININE 3.93* 3.61*    Estimated Creatinine Clearance: 14 mL/min (A) (by C-G formula based on SCr of 3.61 mg/dL (H)).   Medical History: Past Medical History:  Diagnosis Date  . Acute gastric ulcer without mention of hemorrhage, perforation, or obstruction 2006   EGD  . Anxiety   . Atrial fibrillation (HCC)    flecainide; coumadin  . Breast cancer ([place])    left  . Carotid artery disease ([place])    87/56: RICA 4-33%, LICA 29-51%  //  Carotiud [place] 88/41: RICA 6-60%; LICA 63-01% >> FU 1 year   . CHF (congestive heart failure) ([place])   . Chronic diastolic heart failure (HCC)    a. echo 10/10: mild LVH, EF 55-60%, mild AI, mild MR, mild to mod LAE, mild RVE, severe RAE, mod TR, PASP 53  /  b.  Echo 3/14:  Mild LVH, EF 55%, Tr AI, MAC, mild MR, mod LAE, PASP 31, trivial eff. // c. echo 1/17: EF 55-60%, mild AI, MAC, mild MR, moderate BAE, moderate TR  . Chronic lower back pain   . Dementia without behavioral disturbance ([place]) 11/24/2018  . Diabetic peripheral neuropathy ([place]) 03/09/2015  . Diverticulosis   . Esophagitis, unspecified 2006   EGD  . Hiatal hernia 2006   EGD  . History of nuclear stress test    a. Myoview 2/12: EF 69%, no scar or ischemia   . Hx of adenomatous colonic polyps   . Hypertension   . Mediastinal lymphadenopathy   . Memory difficulty 11/14/2016  . Mitral regurgitation    mild, echo, October, 2010  . Osteopenia   . Presence of permanent cardiac pacemaker   . Pulmonary embolus [facility]) 2006   2006, with DVT  . Pulmonary HTN ([place])    53 mmHg echo, 2010, moderate TR, mild right ventricular enlargement  . Tachy-brady syndrome ([place])    s/p pacer 07/2009  . Thyroid nodule    non-neoplastic goiter  . Type II diabetes mellitus ([place])   . Venous insufficiency    Chronic    Medications:  Scheduled:  . albuterol  2 puff Inhalation Q6H  . bethanechol  25 mg Oral BID  . dextrose      . diltiazem  120 mg Oral Daily  . famotidine  20 mg Oral Daily  . mouth rinse  15 mL Mouth Rinse BID  . memantine  5 mg Oral BID  . sodium chloride flush  3 mL Intravenous Q12H  . vitamin C  500 mg Oral Daily  . Warfarin - Pharmacist Dosing Inpatient   Does not apply q1800  . zinc sulfate  220 mg Oral Daily   Infusions:  . ceFEPime (MAXIPIME) IV  Assessment: Pt is a 76 yoF on warfarin PTA for atrial fibrillation. PTA dose variable (1-2 mg daily). Most recently, she was taking 2 mg daily, last administered 5/24. Per nursing [facility], she was started on Levaquin 5/16, last dose yesterday 5/24.   INR 2.8 on admission.   INR trended up to 3.8 today. We will hold her dose of coumadin today and follow with INR.  Goal of Therapy:  INR 2-3 Monitor platelets by anticoagulation protocol: Yes   Plan:  No coumadin today Daily INR and CBC. Monitor for signs/symptoms of bleeding   Onnie Boer, PharmD, BCIDP, AAHIVP, CPP Infectious Disease Pharmacist 02/23/2019 10:10 AM

## 2019-02-23 NOTE — Evaluation (Signed)
Clinical/Bedside Swallow Evaluation Patient Details  Name: [name] [name] MRN: [id number] Date of Birth: 01/15/1931  Today's Date: 02/23/2019 Time: SLP Start Time (ACUTE ONLY): 4081 SLP Stop Time (ACUTE ONLY): 1612 SLP Time Calculation (min) (ACUTE ONLY): 25 min  Past Medical History:  Past Medical History:  Diagnosis Date  . Acute gastric ulcer without mention of hemorrhage, perforation, or obstruction 2006   EGD  . Anxiety   . Atrial fibrillation (HCC)    flecainide; coumadin  . Breast cancer ([place])    left  . Carotid artery disease ([place])    44/81: RICA 8-56%, LICA 31-49%  //  Carotiud [place] 70/26: RICA 3-78%; LICA 58-85% >> FU 1 year   . CHF (congestive heart failure) ([place])   . Chronic diastolic heart failure (HCC)    a. echo 10/10: mild LVH, EF 55-60%, mild AI, mild MR, mild to mod LAE, mild RVE, severe RAE, mod TR, PASP 53  /  b.  Echo 3/14:  Mild LVH, EF 55%, Tr AI, MAC, mild MR, mod LAE, PASP 31, trivial eff. // c. echo 1/17: EF 55-60%, mild AI, MAC, mild MR, moderate BAE, moderate TR  . Chronic lower back pain   . Dementia without behavioral disturbance ([place]) 11/24/2018  . Diabetic peripheral neuropathy ([place]) 03/09/2015  . Diverticulosis   . Esophagitis, unspecified 2006   EGD  . Hiatal hernia 2006   EGD  . History of nuclear stress test    a. Myoview 2/12: EF 69%, no scar or ischemia  . Hx of adenomatous colonic polyps   . Hypertension   . Mediastinal lymphadenopathy   . Memory difficulty 11/14/2016  . Mitral regurgitation    mild, echo, October, 2010  . Osteopenia   . Presence of permanent cardiac pacemaker   . Pulmonary embolus [facility]) 2006   2006, with DVT  . Pulmonary HTN ([place])    53 mmHg echo, 2010, moderate TR, mild right ventricular enlargement  . Tachy-brady syndrome ([place])    s/p pacer 07/2009  . Thyroid nodule    non-neoplastic goiter  . Type II diabetes mellitus ([place])   . Venous insufficiency    Chronic   Past Surgical History:  Past Surgical History:   Procedure Laterality Date  . ABDOMINAL HYSTERECTOMY    . ANKLE FRACTURE SURGERY Right   . BACK SURGERY    . BREAST LUMPECTOMY Left   . FRACTURE SURGERY    . INSERT / REPLACE / REMOVE PACEMAKER  06/30/2009   MDT Adalpta L implanted by Dr Olevia Perches  . LUMBAR LAMINECTOMY  1973, 06/22/2011   "no hardware either time"   HPI:  Pt is an 85 y.o. female admitted from SNF with hypoxia from COVID-19. PMH includes dementia, GERD, HH, afib, DVT/PE, tachy-brady syndrome s/p PPM, breast cancer s/p mastectomy, chronic diastolic heart failure, DM type II, PAH, CKD stage IV, and frequent UTIs   Assessment / Plan / Recommendation Clinical Impression  Pt had a single cough that occurred during large amount of thin liquid intake as she became distracted, with pharyngeal swallow otherwise appearing swift and functional, as can be judged clinically. Occasional airway invasion can be age appropriate, and elicitation of a strong cough suggests appropriate sensation. Given that no other overt signs of aspiration were observed, would continue thin liquids with careful monitoring for any further signs of difficulty or respiratory decline. Also recommend softening her solids, as she does not appear to have any lower dentition and her mastication efforts are very slow and with very  small mandibular movements. What appeared to be small food particles were present upon arrival, but reduced with thin liquid washes. Will start with finely chopped foods for now and f/u briefly for tolerance versus need for purees.  SLP Visit Diagnosis: Dysphagia, oral phase (R13.11)    Aspiration Risk  Mild aspiration risk    Diet Recommendation Dysphagia 2 (Fine chop);Thin liquid   Liquid Administration via: Cup;Straw Medication Administration: Whole meds with puree Supervision: Patient able to self feed;Intermittent supervision to cue for compensatory strategies Compensations: Slow rate;Small sips/bites;Minimize environmental  distractions;Follow solids with liquid Postural Changes: Seated upright at 90 degrees    Other  Recommendations Oral Care Recommendations: Oral care BID   Follow up Recommendations Skilled Nursing facility      Frequency and Duration min 2x/week  1 week       Prognosis Prognosis for Safe Diet Advancement: Fair(suspect near baseline)      Swallow Study   General HPI: Pt is an 85 y.o. female admitted from SNF with hypoxia from COVID-19. PMH includes dementia, GERD, HH, afib, DVT/PE, tachy-brady syndrome s/p PPM, breast cancer s/p mastectomy, chronic diastolic heart failure, DM type II, PAH, CKD stage IV, and frequent UTIs Type of Study: Bedside Swallow Evaluation Previous Swallow Assessment: none in chart Diet Prior to this Study: Dysphagia 3 (soft);Thin liquids Temperature Spikes Noted: No Respiratory Status: Nasal cannula History of Recent Intubation: No Behavior/Cognition: Alert;Requires cueing Oral Cavity Assessment: Other (comment)(tiny food particles) Oral Care Completed by SLP: No Oral Cavity - Dentition: Dentures, top;Missing dentition Vision: Functional for self-feeding Self-Feeding Abilities: Able to feed self Patient Positioning: Upright in bed Baseline Vocal Quality: Normal    Oral/Motor/Sensory Function Overall Oral Motor/Sensory Function: (not consistently cooperative for exam but appears symmetrica)   Ice Chips Ice chips: Not tested   Thin Liquid Thin Liquid: Impaired Presentation: Self Fed;Straw Pharyngeal  Phase Impairments: Cough - Immediate(x1)    Nectar Thick Nectar Thick Liquid: Not tested   Honey Thick Honey Thick Liquid: Not tested   Puree Puree: Not tested   Solid     Solid: Impaired Oral Phase Impairments: Impaired mastication Oral Phase Functional Implications: Oral residue      Venita Sheffield Gerrett Loman 02/23/2019,4:39 PM  Pollyann Glen, M.A. [place] Acute Environmental education officer [phone] Office [phone]

## 2019-02-23 NOTE — Progress Notes (Signed)
Hypoglycemic Event  CBG: 59  Treatment: 25 ml dextrose 50  Symptoms: None  Follow-up CBG: Time: CBG Result:104  Possible Reasons for Event: Lack of nutrition  Comments/MD notified: Dr. Bonner Puna - fluid changed from NS to [place]    Demeco Ducksworth N

## 2019-02-23 NOTE — Progress Notes (Signed)
PROGRESS NOTE                                                                                                                                                                                                             Patient Demographics:    Manpreet Kemmer, is a 85 y.o. female, DOB - Oct 10, 1930, QJF:[id number]  Outpatient Primary MD for the patient is Hoyt Koch, MD    LOS - 1  Admit date - 02/14/2019    Chief Complaint  Patient presents with  . low oxygen  . lethargic       Brief Narrative    [name] Waid is a 85 y.o. female with medical history significant of severe underlying dementia, A. fib, DVT/PE on Coumadin, tachy-brady  syndrome s/p PPM, breast cancer s/p mastectomy, chronic diastolic heart failure, DM type II, PAH, CKD stage IV, and frequent UTIs; who presents with reports of low oxygenation from [facility], she was diagnosed with COVID-19 pneumonitis and admitted to the [facility] hospital.   Subjective:    [name] Annas today has, No headache, No chest pain, No abdominal pain - No Nausea, No new weakness tingling or numbness, No Cough - SOB.  Extremely unreliable historian   Assessment  & Plan :     1. Acute Hypoxic Resp. Failure due to Acute Covid 19 Viral Illness during the ongoing 2020 Covid 19 Pandemic -  CXR suggests pneumonitis with mild hypoxia requiring 1 to 2 L oxygen, inflammatory markers borderline, placed on IV Solu-Medrol and monitor, flutter valve I-S added for pulmonary toiletry.  Low threshold for using Actemra and Remdisvir.  Discussed with daughter, DNR.  Monitor closely.    ABG     Component Value Date/Time   PHART 7.497 (H) 10/18/2018 1417   PCO2ART 28.3 (L) 10/18/2018 1417   PO2ART 62.0 (L) 10/18/2018 1417   HCO3 21.9 10/18/2018 1417   TCO2 23 10/18/2018 1417   O2SAT 94.0 10/18/2018 1417    COVID-19 Labs  Recent Labs    02/23/19 0524 02/23/19 0538  DDIMER   --  1.78*  FERRITIN 300  --   CRP 16.0*  --     Lab Results  Component Value Date   SARSCOV2NAA POSITIVE (A) 02/21/2019        Component Value Date/Time   BNP 718.4 (H) 02/23/2019 8937  BNP 607.5 (H) 09/27/2015 1500      2.  Acute on chronic combined systolic and diastolic CHF, recent echo 45% - clinically does have a few Rales and BNP is elevated, IV Lasix on 02/23/2019, currently on diltiazem which will be continued and will monitor closely.  3.  Chronic dementia.  At risk for delirium, supportive care, not eating well hence added nighttime Remeron.  Dose Namenda will monitor.  4.  History of DVT PE.  On Coumadin pharmacy monitoring INR.  5.  Chronic atrial fibrillation [place] vas 2 score of at least 4.  On diltiazem Coumadin.  Low-dose beta-blocker tomorrow if blood pressure tolerates, given half of home dose, continue to monitor closely.  6.  ARF on CKD stage IV.  Baseline creatinine 2.3.  Hydrate and monitor.  Oral intake has been poor, placed on D5W drip will monitor closely.   7.  GERD.  Placed on PPI.  8.  UTI.  Currently on Maxipime due to multiple drug allergies will monitor.    9. DM type II.  Currently in poor control due to hypoglycemia.  Stop all insulin occluding home dose Levemir and sliding scale here, gentle D5W drip.  Also placed on Solu-Medrol for #1 above.  Monitor.  Lab Results  Component Value Date   HGBA1C 6.3 (H) 10/14/2016   CBG (last 3)  Recent Labs    02/23/19 0931 02/23/19 1245 02/23/19 1327  GLUCAP 78 53* 92     Condition -  Guarded  Family Communication  :  Daughter understands the risks and benefits of using Actemra, accepts it and would like to use it if needed, DNR  Code Status :  DNR  Diet : Soft  Disposition Plan  :  SNF  Consults  :  None  Procedures  :  None  PUD Prophylaxis : PPI  DVT Prophylaxis  :  Coumadin  Lab Results  Component Value Date   INR 3.8 (H) 02/23/2019   INR 2.8 (H) 02/24/2019   INR 2.5 (H)  11/25/2018     Lab Results  Component Value Date   PLT 190 02/23/2019    Inpatient Medications  Scheduled Meds: . albuterol  2 puff Inhalation Q6H  . bethanechol  25 mg Oral BID  . diltiazem  120 mg Oral Daily  . famotidine  20 mg Oral Daily  . feeding supplement (PRO-STAT SUGAR FREE 64)  30 mL Oral TID WC  . mouth rinse  15 mL Mouth Rinse BID  . memantine  5 mg Oral BID  . mirtazapine  15 mg Oral QHS  . sodium chloride flush  3 mL Intravenous Q12H  . vitamin C  500 mg Oral Daily  . Warfarin - Pharmacist Dosing Inpatient   Does not apply q1800  . zinc sulfate  220 mg Oral Daily   Continuous Infusions: . ceFEPime (MAXIPIME) IV    . dextrose     PRN Meds:.guaiFENesin-dextromethorphan, [DISCONTINUED] ondansetron **OR** ondansetron (ZOFRAN) IV  Antibiotics  :    Anti-infectives (From admission, onward)   Start     Dose/Rate Route Frequency Ordered Stop   02/24/19 2000  vancomycin (VANCOCIN) IVPB 750 mg/150 ml premix  Status:  Discontinued     750 mg 150 mL/hr over 60 Minutes Intravenous Every 48 hours 01/29/2019 1930 02/25/2019 2322   02/23/19 2000  ceFEPIme (MAXIPIME) 2 g in sodium chloride 0.9 % 100 mL IVPB     2 g 200 mL/hr over 30 Minutes Intravenous Every 24 hours  02/04/2019 1930     02/27/2019 2030  metroNIDAZOLE (FLAGYL) IVPB 500 mg  Status:  Discontinued     500 mg 100 mL/hr over 60 Minutes Intravenous Every 8 hours 02/02/2019 1912 02/23/19 0932   02/19/2019 2015  ceFEPIme (MAXIPIME) 2 g in sodium chloride 0.9 % 100 mL IVPB     2 g 200 mL/hr over 30 Minutes Intravenous  Once 02/13/2019 1912 02/10/2019 2132   02/07/2019 1930  vancomycin (VANCOCIN) 1,750 mg in sodium chloride 0.9 % 500 mL IVPB  Status:  Discontinued     1,750 mg 250 mL/hr over 120 Minutes Intravenous  Once 02/21/2019 1929 02/23/2019 2322   02/10/2019 1915  vancomycin (VANCOCIN) IVPB 1000 mg/200 mL premix  Status:  Discontinued     1,000 mg 200 mL/hr over 60 Minutes Intravenous  Once 02/08/2019 1912 02/20/2019 1929        Time Spent in minutes  30   Lala Lund M.D on 02/23/2019 at 2:06 PM  To page go to [email or web address] - password [facility]  [facility] -  Office  [phone]   See all Orders from today for further details    Objective:   Vitals:   02/23/19 0900 02/23/19 1055 02/23/19 1100 02/23/19 1206  BP: 96/60 (!) 105/55 99/63 110/69  Pulse: 86   93  Resp: (!) 21  16 18   Temp:    (!) 97.5 F (36.4 C)  TempSrc:    Axillary  SpO2: 100%   99%  Weight:        Wt Readings from Last 3 Encounters:  02/18/2019 88 kg  11/25/18 100.4 kg  10/27/18 102.7 kg     Intake/Output Summary (Last 24 hours) at 02/23/2019 1406 Last data filed at 02/23/2019 1207 Gross per 24 hour  Intake 792.27 ml  Output 520 ml  Net 272.27 ml     Physical Exam  Awake but confused, No new F.N deficits, Normal affect [place].AT,PERRAL Supple Neck,No JVD, No cervical lymphadenopathy appriciated.  Symmetrical Chest wall movement, Good air movement bilaterally, CTAB RRR,No Gallops,Rubs or new Murmurs, No Parasternal Heave +ve B.Sounds, Abd Soft, No tenderness, No organomegaly appriciated, No rebound - guarding or rigidity. No Cyanosis, Clubbing or edema, No new Rash or bruise      Data Review:    CBC Recent Labs  Lab 02/21/2019 1322 02/23/19 0538  WBC 4.7 4.8  HGB 14.4 13.2  HCT 47.0* 43.8  PLT 198 190  MCV 85.6 87.3  MCH 26.2 26.3  MCHC 30.6 30.1  RDW 16.4* 16.9*  LYMPHSABS 0.4* 0.4*  MONOABS 0.2 0.1  EOSABS 0.0 0.0  BASOSABS 0.0 0.0    Chemistries  Recent Labs  Lab 01/29/2019 1322 02/23/19 0538  NA 141 143  K 4.4 4.2  CL 106 112*  CO2 20* 19*  GLUCOSE 97 49*  BUN 79* 76*  CREATININE 3.93* 3.61*  CALCIUM 8.4* 8.0*  AST 37 28  ALT 11 11  ALKPHOS 125 104  BILITOT 1.2 0.8   ------------------------------------------------------------------------------------------------------------------ Recent Labs    02/23/19 0538  TRIG 98    Lab Results  Component Value Date   HGBA1C 6.3 (H)  10/14/2016   ------------------------------------------------------------------------------------------------------------------ No results for input(s): TSH, T4TOTAL, T3FREE, THYROIDAB in the last 72 hours.  Invalid input(s): FREET3  Cardiac Enzymes No results for input(s): CKMB, TROPONINI, MYOGLOBIN in the last 168 hours.  Invalid input(s): CK ------------------------------------------------------------------------------------------------------------------    Component Value Date/Time   BNP 718.4 (H) 02/23/2019 0548   BNP 607.5 (H) 09/27/2015 1500  Micro Results Recent Results (from the past 240 hour(s))  Blood Culture (routine x 2)     Status: None (Preliminary result)   Collection Time: 02/06/2019  1:20 PM  Result Value Ref Range Status   Specimen Description BLOOD RIGHT ANTECUBITAL  Final   Special Requests   Final    BOTTLES DRAWN AEROBIC AND ANAEROBIC Blood Culture results may not be optimal due to an inadequate volume of blood received in culture bottles   Culture   Final    NO GROWTH < 24 HOURS Performed at [facility] [address], [place], [place] [address]    Report Status PENDING  Incomplete  Blood Culture (routine x 2)     Status: None (Preliminary result)   Collection Time: 02/02/2019  1:35 PM  Result Value Ref Range Status   Specimen Description BLOOD RIGHT HAND  Final   Special Requests   Final    BOTTLES DRAWN AEROBIC ONLY Blood Culture results may not be optimal due to an inadequate volume of blood received in culture bottles   Culture   Final    NO GROWTH < 24 HOURS Performed at [facility], [place] [address], [place], [place] [address]    Report Status PENDING  Incomplete  SARS Coronavirus 2 (CEPHEID- Performed in [place] hospital lab), Hosp Order     Status: Abnormal   Collection Time: 02/06/2019  1:40 PM  Result Value Ref Range Status   SARS Coronavirus 2 POSITIVE (A) NEGATIVE Final    Comment: RESULT CALLED TO, READ BACK BY AND  VERIFIED WITH: M. Coffey RN 14:50 02/02/2019 ([email or web address]) (NOTE) If result is NEGATIVE SARS-CoV-2 target nucleic acids are NOT DETECTED. The SARS-CoV-2 RNA is generally detectable in upper and lower  respiratory specimens during the acute phase of infection. The lowest  concentration of SARS-CoV-2 viral copies this assay can detect is 250  copies / mL. A negative result does not preclude SARS-CoV-2 infection  and should not be used as the sole basis for treatment or other  patient management decisions.  A negative result may occur with  improper specimen collection / handling, submission of specimen other  than nasopharyngeal swab, presence of viral mutation(s) within the  areas targeted by this assay, and inadequate number of viral copies  (<250 copies / mL). A negative result must be combined with clinical  observations, patient history, and epidemiological information. If result is POSITIVE SARS-CoV-2 target nucleic acids are DETECTED.  The SARS-CoV-2 RNA is generally detectable in upper and lower  respiratory specimens during the acute phase of infection.  Positive  results are indicative of active infection with SARS-CoV-2.  Clinical  correlation with patient history and other diagnostic information is  necessary to determine patient infection status.  Positive results do  not rule out bacterial infection or co-infection with other viruses. If result is PRESUMPTIVE POSTIVE SARS-CoV-2 nucleic acids MAY BE PRESENT.   A presumptive positive result was obtained on the submitted specimen  and confirmed on repeat testing.  While 2019 novel coronavirus  (SARS-CoV-2) nucleic acids may be present in the submitted sample  additional confirmatory testing may be necessary for epidemiological  and / or clinical management purposes  to differentiate between  SARS-CoV-2 and other Sarbecovirus currently known to infect humans.  If clinically indicated additional testing with an alternate test   methodology [phone]) i s advised. The SARS-CoV-2 RNA is generally  detectable in upper and lower respiratory specimens during the acute  phase of  infection. The expected result is Negative. Fact Sheet for Patients:  StrictlyIdeas.no Fact Sheet for Healthcare Providers: BankingDealers.co.za This test is not yet approved or cleared by the [place] FDA and has been authorized for detection and/or diagnosis of SARS-CoV-2 by FDA under an Emergency Use Authorization (EUA).  This EUA will remain in effect (meaning this test can be used) for the duration of the COVID-19 declaration under Section 564(b)(1) of the Act, 21 U.S.C. section 360bbb-3(b)(1), unless the authorization is terminated or revoked sooner. Performed at [facility], [place] [address] [place], [place] [address]     Radiology Reports Dg Chest Port 1 View  Result Date: 02/07/2019 CLINICAL DATA:  Low O2 sats.  Shortness of breath. EXAM: PORTABLE CHEST 1 VIEW COMPARISON:  10/18/2018 FINDINGS: Patient is rotated to the right displacing cardiomediastinal anatomy into the medial right hemithorax. Interstitial markings are diffusely coarsened with chronic features. The cardio pericardial silhouette is enlarged. Permanent pacemaker noted. Bones are diffusely demineralized. IMPRESSION: Cardiomegaly with underlying diffuse chronic interstitial lung disease. Interstitial markings are more prominent today raising the question of interstitial edema although this may be related to positioning. No substantial pleural effusion. Electronically Signed   By: Misty Stanley M.D.   On: 02/14/2019 14:13

## 2019-02-23 NOTE — Progress Notes (Signed)
Pt has had two episodes of hypoglycemia that were found with routine blood sugar checks.  The first was around 07:30am.  Pt's initial CBG was 40.  Pt was encouraged to drink 8 ounces of apple juice without much success.  Initially pt only drank 4oz and the recheck cbg was 55.  Pt was then given 12.5g of D5 IV per hypoglycemia standing orders and recheck CBG was 78.  Dr. Candiss Norse made aware as he was rounding on the floor. Pt's second episode of hypoglycemia was about 12:50pm.  Pt's CBG was 53.  Pt was encourage to eat her lunch but only took a few bites of magic cup yogurt. Pt was given 25g of D5 IV per hypoglycemia standing orders. Pt's recheck CBG was 92.  Pt has refused to eat both breakfast and dinner despite multiple attempts by staff to feed pt.  Pt is wheelchair bound at baseline and refuses to be moved to chair by staff. Dr. Candiss Norse paged.

## 2019-02-23 NOTE — NC FL2 (Signed)
[place] MEDICAID FL2 LEVEL OF CARE SCREENING TOOL     IDENTIFICATION  Patient Name: [name] [name] Birthdate: 12-08-1930 Sex: female Admission Date (Current Location): 02/25/2019  [facility] and [place] Number:  Herbalist and Address:  [place]. [facility], [place] [address], [place], [place] [address]      Provider Number: [id number]  Attending Physician Name and Address:  Thurnell Lose, MD  Relative Name and Phone Number:  Artemio Aly; daughter; [phone]    Current Level of Care: Hospital Recommended Level of Care: [place] Prior Approval Number:    Date Approved/Denied:   PASRR Number: [id number]  Discharge Plan: SNF    Current Diagnoses: Patient Active Problem List   Diagnosis Date Noted  . Acute respiratory failure with hypoxia ([place]) 02/24/2019  . Hypotension 02/01/2019  . Hypothermia 02/18/2019  . Acute kidney injury superimposed on chronic kidney disease ([place]) 02/19/2019  . COVID-19 virus infection 02/18/2019  . Dementia without behavioral disturbance ([place]) 11/24/2018  . FTT (failure to thrive) in adult 11/24/2018  . AKI (acute kidney injury) ([place]) 11/23/2018  . Pressure injury of skin 10/24/2018  . Palliative care by specialist   . Fall at home, initial encounter 10/18/2018  . Pulmonary embolism on long-term anticoagulation therapy ([place]) 10/18/2018  . Syncope and collapse 10/18/2018  . Weakness 09/03/2018  . Pruritus 05/08/2018  . Morbid obesity due to excess calories ([place]) complicated by hbp 39/76/7341  . Solitary pulmonary nodule on lung CT 03/21/2017  . Amiodarone toxicity 03/21/2017  . Pacemaker complications 93/79/0240  . Recurrent falls 01/13/2017  . Cognitive change   . A-fib ([place]) 10/14/2016  . Dysuria 10/10/2016  . Chronic back pain 07/28/2016  . Diabetes mellitus type 2, noninsulin dependent ([place])   . CKD (chronic kidney disease), stage III ([place]) 04/12/2016  . Chronic  anticoagulation-Coumadin 04/12/2016  . Dyspnea on exertion 04/12/2016  . Hypertensive heart disease 03/28/2016  . Anemia, iron deficiency 10/22/2015  . Diabetic peripheral neuropathy ([place]) 03/09/2015  . Chronic diastolic (congestive) heart failure ([place]) 09/07/2014  . Sick sinus syndrome ([place]) 03/09/2014  . Goals of care, counseling/discussion 11/04/2013  . Hypertension 06/21/2013  . Osteopenia   . Pulmonary HTN ([place])   . History of pulmonary embolism   . Carotid artery disease ([place])   . History of breast cancer   . Pacemaker-[employer]   . Syncope   . Mitral regurgitation   . Mediastinal lymphadenopathy     Orientation RESPIRATION BLADDER Height & Weight     Self  O2(2L nasal canula) Incontinent, External catheter Weight: 194 lb (88 kg) Height:     BEHAVIORAL SYMPTOMS/MOOD NEUROLOGICAL BOWEL NUTRITION STATUS      Incontinent Diet(see discharge summary)  AMBULATORY STATUS COMMUNICATION OF NEEDS Skin   Extensive Assist Verbally PU Stage and Appropriate Care, Other (Comment)(stage 1 on buttocks with foam; excoriation and MASD on bilateral buttocks; discoloration on left ankle) PU Stage 1 Dressing: (on buttocks with foam)                     Personal Care Assistance Level of Assistance  Bathing, Feeding, Dressing Bathing Assistance: Maximum assistance Feeding assistance: Limited assistance Dressing Assistance: Maximum assistance     Functional Limitations Info  Sight, Hearing, Speech Sight Info: Adequate Hearing Info: Adequate Speech Info: Adequate    SPECIAL CARE FACTORS FREQUENCY  PT (By licensed PT), OT (By licensed OT)     PT Frequency: 5x week OT Frequency: 5x week  Contractures Contractures Info: Not present    Additional Factors Info  Code Status, Allergies, Isolation Precautions, Psychotropic Code Status Info: DNR Allergies Info: AMIODARONE, FLECAINIDE, MORPHINE AND RELATED, BACTRIM DS SULFAMETHOXAZOLE-TRIMETHOPRIM, CEPHALEXIN, PINEAPPLE   Psychotropic Info: memantine (NAMENDA) tablet 5 mg 2x daily PO; mirtazapine (REMERON SOL-TAB) disintegrating tablet 15 mg daily at bedtime PO   Isolation Precautions Info: Airborne; Contact     Current Medications (02/23/2019):  This is the current hospital active medication list Current Facility-Administered Medications  Medication Dose Route Frequency Provider Last Rate Last Dose  . albuterol (VENTOLIN HFA) 108 (90 Base) MCG/ACT inhaler 2 puff  2 puff Inhalation Q6H Fuller Plan A, MD   2 puff at 02/24/2019 2226  . bethanechol (URECHOLINE) tablet 25 mg  25 mg Oral BID Fuller Plan A, MD   25 mg at 02/12/2019 2230  . ceFEPIme (MAXIPIME) 2 g in sodium chloride 0.9 % 100 mL IVPB  2 g Intravenous Q24H Smith, Rondell A, MD      . dextrose 5 % solution   Intravenous Continuous Thurnell Lose, MD 50 mL/hr at 02/23/19 1431    . diltiazem (CARDIZEM CD) 24 hr capsule 120 mg  120 mg Oral Daily Smith, Rondell A, MD   120 mg at 02/23/19 1057  . famotidine (PEPCID) tablet 20 mg  20 mg Oral Daily Smith, Rondell A, MD   20 mg at 02/23/19 1055  . feeding supplement (PRO-STAT SUGAR FREE 64) liquid 30 mL  30 mL Oral TID WC Singh, Prashant K, MD      . guaiFENesin-dextromethorphan (ROBITUSSIN DM) 100-10 MG/5ML syrup 10 mL  10 mL Oral Q4H PRN Smith, Rondell A, MD      . MEDLINE mouth rinse  15 mL Mouth Rinse BID Vance Gather B, MD      . memantine [facility]) tablet 5 mg  5 mg Oral BID Fuller Plan A, MD   5 mg at 02/23/19 1055  . methylPREDNISolone sodium succinate (SOLU-MEDROL) 125 mg/2 mL injection 60 mg  60 mg Intravenous Q8H Thurnell Lose, MD   60 mg at 02/23/19 1518  . [START ON 02/24/2019] metoprolol succinate (TOPROL-XL) 24 hr tablet 50 mg  50 mg Oral Daily Thurnell Lose, MD      . mirtazapine (REMERON SOL-TAB) disintegrating tablet 15 mg  15 mg Oral QHS Thurnell Lose, MD      . ondansetron [facility]) injection 4 mg  4 mg Intravenous Q6H PRN Smith, Rondell A, MD      . sodium chloride flush  (NS) 0.9 % injection 3 mL  3 mL Intravenous Q12H Smith, Rondell A, MD   3 mL at 02/23/19 1110  . vitamin C (ASCORBIC ACID) tablet 500 mg  500 mg Oral Daily Smith, Rondell A, MD   500 mg at 02/23/19 1055  . Warfarin - Pharmacist Dosing Inpatient   Does not apply q1800 Fuller Plan A, MD      . zinc sulfate capsule 220 mg  220 mg Oral Daily Fuller Plan A, MD   220 mg at 02/23/19 1055     Discharge Medications: Please see discharge summary for a list of discharge medications.  Relevant Imaging Results:  Relevant Lab Results:   Additional Information SSN: [place], [place]

## 2019-02-23 NOTE — Progress Notes (Signed)
Hypoglycemic Event  CBG: 48  Treatment: 4 oz apple juice  Symptoms: lethargic  Follow-up CBG: Time:   CBG Result:  59  Possible Reasons for Event: Lack of nutrition  Comments/MD notified: Dr. Vance Gather, [place] Quinter N

## 2019-02-24 LAB — BLOOD CULTURE ID PANEL (REFLEXED)

## 2019-02-24 LAB — COMPREHENSIVE METABOLIC PANEL
ALT: 6 U/L (ref 0–44)
AST: 31 U/L (ref 15–41)
Albumin: 2.1 g/dL — ABNORMAL LOW (ref 3.5–5.0)
Alkaline Phosphatase: 108 U/L (ref 38–126)
Anion gap: 17 — ABNORMAL HIGH (ref 5–15)
BUN: 72 mg/dL — ABNORMAL HIGH (ref 8–23)
CO2: 13 mmol/L — ABNORMAL LOW (ref 22–32)
Calcium: 8.3 mg/dL — ABNORMAL LOW (ref 8.9–10.3)
Chloride: 112 mmol/L — ABNORMAL HIGH (ref 98–111)
Creatinine, Ser: 3.13 mg/dL — ABNORMAL HIGH (ref 0.44–1.00)
GFR calc Af Amer: 15 mL/min — ABNORMAL LOW (ref >60)
GFR calc non Af Amer: 13 mL/min — ABNORMAL LOW (ref >60)
Glucose, Bld: 232 mg/dL — ABNORMAL HIGH (ref 70–99)
Potassium: 4.1 mmol/L (ref 3.5–5.1)
Sodium: 142 mmol/L (ref 135–145)
Total Bilirubin: 0.9 mg/dL (ref 0.3–1.2)
Total Protein: 6.5 g/dL (ref 6.5–8.1)

## 2019-02-24 LAB — GLUCOSE, CAPILLARY
Glucose-Capillary: 59 mg/dL — ABNORMAL LOW (ref 70–99)
Glucose-Capillary: 251 mg/dL — ABNORMAL HIGH (ref 70–99)
Glucose-Capillary: 253 mg/dL — ABNORMAL HIGH (ref 70–99)
Glucose-Capillary: 248 mg/dL — ABNORMAL HIGH (ref 70–99)
Glucose-Capillary: 248 mg/dL — ABNORMAL HIGH (ref 70–99)

## 2019-02-24 LAB — CBC WITH DIFFERENTIAL/PLATELET
Abs Immature Granulocytes: 0.1 10*3/uL — ABNORMAL HIGH (ref 0.00–0.07)
Basophils Absolute: 0 10*3/uL (ref 0.0–0.1)
Basophils Relative: 1 %
Eosinophils Absolute: 0 10*3/uL (ref 0.0–0.5)
Eosinophils Relative: 0 %
HCT: 46.1 % — ABNORMAL HIGH (ref 36.0–46.0)
Hemoglobin: 14.1 g/dL (ref 12.0–15.0)
Immature Granulocytes: 3 %
Lymphocytes Relative: 5 %
Lymphs Abs: 0.2 10*3/uL — ABNORMAL LOW (ref 0.7–4.0)
MCH: 26.5 pg (ref 26.0–34.0)
MCHC: 30.6 g/dL (ref 30.0–36.0)
MCV: 86.5 fL (ref 80.0–100.0)
Monocytes Absolute: 0.1 10*3/uL (ref 0.1–1.0)
Monocytes Relative: 2 %
Neutro Abs: 3.5 10*3/uL (ref 1.7–7.7)
Neutrophils Relative %: 89 %
Platelets: 197 10*3/uL (ref 150–400)
RBC: 5.33 MIL/uL — ABNORMAL HIGH (ref 3.87–5.11)
RDW: 17 % — ABNORMAL HIGH (ref 11.5–15.5)
WBC: 3.9 10*3/uL — ABNORMAL LOW (ref 4.0–10.5)
nRBC: 0 % (ref 0.0–0.2)

## 2019-02-24 LAB — FERRITIN: Ferritin: 276 ng/mL (ref 11–307)

## 2019-02-24 LAB — PROTIME-INR
INR: 4.8 (ref 0.8–1.2)
Prothrombin Time: 44 seconds — ABNORMAL HIGH (ref 11.4–15.2)

## 2019-02-24 LAB — D-DIMER, QUANTITATIVE: D-Dimer, Quant: 1.98 ug/mL-FEU — ABNORMAL HIGH (ref 0.00–0.50)

## 2019-02-24 LAB — C-REACTIVE PROTEIN: CRP: 18.2 mg/dL — ABNORMAL HIGH (ref <1.0)

## 2019-02-24 MED ORDER — VITAMIN K1 10 MG/ML IJ SOLN
0.5000 mg | Freq: Once | INTRAVENOUS | Status: AC
  Administered 2019-02-24: 0.5 mg via INTRAVENOUS
  Filled 2019-02-24: qty 0.05

## 2019-02-24 MED ORDER — HALOPERIDOL LACTATE 5 MG/ML IJ SOLN
1.0000 mg | Freq: Four times a day (QID) | INTRAMUSCULAR | Status: DC | PRN
  Administered 2019-02-24: 1 mg via INTRAVENOUS
  Filled 2019-02-24: qty 1

## 2019-02-24 MED ORDER — DEXTROSE 5 % IV SOLN
INTRAVENOUS | Status: DC
  Administered 2019-02-24: 11:00:00 via INTRAVENOUS

## 2019-02-24 MED ORDER — QUETIAPINE FUMARATE 25 MG PO TABS
25.0000 mg | ORAL_TABLET | Freq: Every day | ORAL | Status: DC
  Administered 2019-02-24 – 2019-02-26 (×3): 25 mg via ORAL
  Filled 2019-02-24 (×4): qty 1

## 2019-02-24 MED ORDER — METHYLPREDNISOLONE SODIUM SUCC 40 MG IJ SOLR
40.0000 mg | Freq: Every day | INTRAMUSCULAR | Status: DC
  Filled 2019-02-24: qty 1

## 2019-02-24 MED ORDER — TOCILIZUMAB 400 MG/20ML IV SOLN
8.0000 mg/kg | Freq: Once | INTRAVENOUS | Status: AC
  Administered 2019-02-24: 14:00:00 704 mg via INTRAVENOUS
  Filled 2019-02-24: qty 35.2

## 2019-02-24 MED ORDER — ALBUTEROL SULFATE (2.5 MG/3ML) 0.083% IN NEBU: 3.0000 mL | INHALATION_SOLUTION | Freq: Four times a day (QID) | RESPIRATORY_TRACT | Status: DC | PRN

## 2019-02-24 NOTE — Progress Notes (Signed)
Spoke with patient's daughter, Artemio Aly, at [phone] to give her an update. She appreciated the call and the care we are giving her Mother.

## 2019-02-24 NOTE — Progress Notes (Signed)
SLP Cancellation Note  Patient Details Name: [name] [name] MRN: [id number] DOB: Aug 18, 1931   Cancelled treatment:       Reason Eval/Treat Not Completed: Fatigue/lethargy limiting ability to participate. Pt is drowsier this afternoon compared to initial evaluation. Meal tray at bedside table appears to be untouched. Pt would open her eyes but could not be engaged in PO intake. Will continue efforts.    Venita Sheffield Diontae Route 02/24/2019, 5:09 PM  Pollyann Glen, M.A. [place] Acute Environmental education officer [phone] Office [phone]

## 2019-02-24 NOTE — Progress Notes (Signed)
Temp 93.4 axillary. Placed warm blankets on patients. Paged Dr. Bonner Puna. Will continue to monitor.

## 2019-02-24 NOTE — Progress Notes (Signed)
Inpatient Diabetes Program Recommendations  AACE/ADA: New Consensus Statement on Inpatient Glycemic Control (2015)  Target Ranges:  Prepandial:   less than 140 mg/dL      Peak postprandial:   less than 180 mg/dL (1-2 hours)      Critically ill patients:  140 - 180 mg/dL   Results for [name] [name], [name] [name] (MRN [id number]) as of 02/24/2019 11:51  Ref. Range 02/23/2019 08:09 02/23/2019 09:31 02/23/2019 12:45 02/23/2019 13:27 02/23/2019 17:30 02/23/2019 21:22 02/24/2019 08:15  Glucose-Capillary Latest Ref Range: 70 - 99 mg/dL 55 (L) 78 53 (L) 92 82 143 (H) 251 (H)    Review of Glycemic Control  Diabetes history: DM 2 Outpatient Diabetes medications: Levemir 10 units Daily, Novolog 0-12 units starting at 200 mg/dl  Current orders for Inpatient glycemic control:  D5 at 75 ml/hour  IV Solumedrol 40 mg Daily  Glucose trends just now starting to increase will follow trends, maybe able to add Correction scale later today. Dr. Candiss Norse following.  Thanks,  Tama Headings RN, MSN, BC-ADM Inpatient Diabetes Coordinator Team Pager [phone] (8a-5p)

## 2019-02-24 NOTE — Progress Notes (Signed)
PROGRESS NOTE                                                                                                                                                                                                             Patient Demographics:    [name] [name], is a 85 y.o. female, DOB - [birth year], GEZ:[id number]  Outpatient Primary MD for the patient is Hoyt Koch, MD    LOS - 2  Admit date - 02/20/2019    Chief Complaint  Patient presents with  . low oxygen  . lethargic       Brief Narrative    [name] [name] is a 85 y.o. female with medical history significant of severe underlying dementia, A. fib, DVT/PE on Coumadin, tachy-brady  syndrome s/p PPM, breast cancer s/p mastectomy, chronic diastolic heart failure, DM type II, PAH, CKD stage IV, and frequent UTIs; who presents with reports of low oxygenation from [facility], she was diagnosed with COVID-19 pneumonitis and admitted to the [facility] hospital.   Subjective:   Patient in bed, more somnolent and less responsive today, unable to answer questions reliably or follow commands.   Assessment  & Plan :     1. Acute Hypoxic Resp. Failure due to Acute Covid 19 Viral Illness during the ongoing 2020 Covid 19 Pandemic -  CXR suggests pneumonitis with mild hypoxia requiring 1 to 2 L oxygen, inflammatory markers borderline, placed on IV Solu-Medrol and monitor, flutter valve I-S added for pulmonary toiletry.  Inflammatory markers slightly trending up on 02/24/2019 we will start her on Actemra, overall quite guarded, oxygenation is still stable and requiring only 2 L for now, continue to monitor, main issue appears to be encephalopathy.  ABG     Component Value Date/Time   PHART 7.497 (H) 10/18/2018 1417   PCO2ART 28.3 (L) 10/18/2018 1417   PO2ART 62.0 (L) 10/18/2018 1417   HCO3 21.9 10/18/2018 1417   TCO2 23 10/18/2018 1417   O2SAT 94.0 10/18/2018 1417     COVID-19 Labs  Recent Labs    02/23/19 0524 02/23/19 0538 02/24/19 0425 02/24/19 0430  DDIMER  --  1.78* 1.98*  --   FERRITIN 300  --   --  276  CRP 16.0*  --   --  18.2*    Lab Results  Component Value Date   SARSCOV2NAA  POSITIVE (A) 02/11/2019        Component Value Date/Time   BNP 718.4 (H) 02/23/2019 0548   BNP 607.5 (H) 09/27/2015 1500      2.  Acute on chronic combined systolic and diastolic CHF, recent echo 45% - clinically does have a few Rales and BNP is elevated, IV Lasix on 02/23/2019, currently on diltiazem which will be continued and will monitor closely.  3.  Chronic dementia with delirium and toxic encephalopathy.  On Namenda which will be continued, still getting quite agitated at nighttime and aggressive towards the staff, low-dose Haldol given on 02/23/2019 night seems to have made her very somnolent, will try nighttime Seroquel instead, soft mittens for patient and staff protection.  Avoid benzodiazepines and narcotics.  4.  History of DVT PE.  On Coumadin per pharmacy, due to poor oral intake and relative depletion and vitamin K levels INR is 4.8 on 02/24/2019, low-dose IV vitamin K x1 and monitor.  5.  Chronic atrial fibrillation [place] vas 2 score of at least 4.  On diltiazem Coumadin.  Low-dose beta-blocker tomorrow if blood pressure tolerates, given half of home dose, continue to monitor closely.  6.  ARF on CKD stage IV.  Baseline creatinine 2.3.  Hydrate and monitor.  Oral intake has been poor, placed on D5W drip will monitor closely.   7.  GERD.  Placed on PPI.  8.  UTI.  Currently on Maxipime due to multiple drug allergies will monitor.    9. DM type II.  Currently in poor control due to hypoglycemia.  Stop all insulin occluding home dose Levemir and sliding scale here, gentle D5W drip.  Also placed on Solu-Medrol for #1 above.  Monitor.  Lab Results  Component Value Date   HGBA1C 6.3 (H) 10/14/2016   CBG (last 3)  Recent Labs    02/23/19 1730  02/23/19 2122 02/24/19 0815  GLUCAP 44 143* 251*     Condition -  Guarded  Family Communication  :  Daughter understands the risks and benefits of using Actemra, accepts it and would like to use it if needed, DNR, overall prognosis remains guarded daughter updated again on 02/24/2019.  If declines full comfort.  Code Status :  DNR  Diet : Soft  Disposition Plan  :  SNF  Consults  :  None  Procedures  :  None  PUD Prophylaxis : PPI  DVT Prophylaxis  :  Coumadin  Lab Results  Component Value Date   INR 4.8 (HH) 02/24/2019   INR 3.8 (H) 02/23/2019   INR 2.8 (H) 02/07/2019     Lab Results  Component Value Date   PLT 197 02/24/2019    Inpatient Medications  Scheduled Meds: . bethanechol  25 mg Oral BID  . diltiazem  120 mg Oral Daily  . famotidine  20 mg Oral Daily  . feeding supplement (PRO-STAT SUGAR FREE 64)  30 mL Oral TID WC  . mouth rinse  15 mL Mouth Rinse BID  . memantine  5 mg Oral BID  . [START ON 02/25/2019] methylPREDNISolone (SOLU-MEDROL) injection  40 mg Intravenous Daily  . metoprolol succinate  50 mg Oral Daily  . QUEtiapine  25 mg Oral Daily  . sodium chloride flush  3 mL Intravenous Q12H  . vitamin C  500 mg Oral Daily  . Warfarin - Pharmacist Dosing Inpatient   Does not apply q1800  . zinc sulfate  220 mg Oral Daily   Continuous Infusions: . ceFEPime (MAXIPIME) IV  Stopped (02/23/19 2021)  . dextrose    . phytonadione (VITAMIN K) IV    . tocilizumab (ACTEMRA) IV     PRN Meds:.albuterol, guaiFENesin-dextromethorphan, [DISCONTINUED] ondansetron **OR** ondansetron (ZOFRAN) IV  Antibiotics  :    Anti-infectives (From admission, onward)   Start     Dose/Rate Route Frequency Ordered Stop   02/24/19 2000  vancomycin (VANCOCIN) IVPB 750 mg/150 ml premix  Status:  Discontinued     750 mg 150 mL/hr over 60 Minutes Intravenous Every 48 hours 02/10/2019 1930 02/23/2019 2322   02/23/19 2000  ceFEPIme (MAXIPIME) 2 g in sodium chloride 0.9 % 100 mL IVPB      2 g 200 mL/hr over 30 Minutes Intravenous Every 24 hours 01/30/2019 1930     02/08/2019 2030  metroNIDAZOLE (FLAGYL) IVPB 500 mg  Status:  Discontinued     500 mg 100 mL/hr over 60 Minutes Intravenous Every 8 hours 02/23/2019 1912 02/23/19 0932   02/20/2019 2015  ceFEPIme (MAXIPIME) 2 g in sodium chloride 0.9 % 100 mL IVPB     2 g 200 mL/hr over 30 Minutes Intravenous  Once 01/29/2019 1912 02/26/2019 2132   02/19/2019 1930  vancomycin (VANCOCIN) 1,750 mg in sodium chloride 0.9 % 500 mL IVPB  Status:  Discontinued     1,750 mg 250 mL/hr over 120 Minutes Intravenous  Once 02/12/2019 1929 02/11/2019 2322   02/19/2019 1915  vancomycin (VANCOCIN) IVPB 1000 mg/200 mL premix  Status:  Discontinued     1,000 mg 200 mL/hr over 60 Minutes Intravenous  Once 02/12/2019 1912 02/25/2019 1929       Time Spent in minutes  30   Lala Lund M.D on 02/24/2019 at 10:48 AM  To page go to [email or web address] - password [facility]  [facility] -  Office  [phone]   See all Orders from today for further details    Objective:   Vitals:   02/23/19 2156 02/24/19 0200 02/24/19 0442 02/24/19 0725  BP:  115/69  125/71  Pulse:  91  80  Resp:  18  16  Temp: 97.7 F (36.5 C)  (!) 93.4 F (34.1 C)   TempSrc: Axillary  Axillary   SpO2:  100%  100%  Weight:        Wt Readings from Last 3 Encounters:  02/19/2019 88 kg  11/25/18 100.4 kg  10/27/18 102.7 kg     Intake/Output Summary (Last 24 hours) at 02/24/2019 1048 Last data filed at 02/24/2019 0324 Gross per 24 hour  Intake 1251.79 ml  Output 150 ml  Net 1101.79 ml     Physical Exam  Somnolent and confused, wearing soft restraints, unable to cooperate in exam [place].AT,PERRAL Supple Neck,No JVD, No cervical lymphadenopathy appriciated.  Symmetrical Chest wall movement, Good air movement bilaterally, few rales RRR,No Gallops, Rubs or new Murmurs, No Parasternal Heave +ve B.Sounds, Abd Soft, No tenderness, No organomegaly appriciated, No rebound - guarding or  rigidity. No Cyanosis, Clubbing or edema, No new Rash or bruise    Data Review:    CBC Recent Labs  Lab 02/26/2019 1322 02/23/19 0538 02/24/19 0425  WBC 4.7 4.8 3.9*  HGB 14.4 13.2 14.1  HCT 47.0* 43.8 46.1*  PLT 198 190 197  MCV 85.6 87.3 86.5  MCH 26.2 26.3 26.5  MCHC 30.6 30.1 30.6  RDW 16.4* 16.9* 17.0*  LYMPHSABS 0.4* 0.4* 0.2*  MONOABS 0.2 0.1 0.1  EOSABS 0.0 0.0 0.0  BASOSABS 0.0 0.0 0.0    Chemistries  Recent Labs  Lab  02/14/2019 1322 02/23/19 0538 02/24/19 0425  NA 141 143 142  K 4.4 4.2 4.1  CL 106 112* 112*  CO2 20* 19* 13*  GLUCOSE 97 49* 232*  BUN 79* 76* 72*  CREATININE 3.93* 3.61* 3.13*  CALCIUM 8.4* 8.0* 8.3*  AST 37 28 31  ALT 11 11 6   ALKPHOS 125 104 108  BILITOT 1.2 0.8 0.9   ------------------------------------------------------------------------------------------------------------------ Recent Labs    02/23/19 0538  TRIG 98    Lab Results  Component Value Date   HGBA1C 6.3 (H) 10/14/2016   ------------------------------------------------------------------------------------------------------------------ No results for input(s): TSH, T4TOTAL, T3FREE, THYROIDAB in the last 72 hours.  Invalid input(s): FREET3  Cardiac Enzymes No results for input(s): CKMB, TROPONINI, MYOGLOBIN in the last 168 hours.  Invalid input(s): CK ------------------------------------------------------------------------------------------------------------------    Component Value Date/Time   BNP 718.4 (H) 02/23/2019 0548   BNP 607.5 (H) 09/27/2015 1500    Micro Results Recent Results (from the past 240 hour(s))  Blood Culture (routine x 2)     Status: None (Preliminary result)   Collection Time: 01/29/2019  1:20 PM  Result Value Ref Range Status   Specimen Description BLOOD RIGHT ANTECUBITAL  Final   Special Requests   Final    BOTTLES DRAWN AEROBIC AND ANAEROBIC Blood Culture results may not be optimal due to an inadequate volume of blood received in  culture bottles   Culture  Setup Time   Final    GRAM POSITIVE COCCI AEROBIC BOTTLE ONLY Organism ID to follow CRITICAL RESULT CALLED TO, READ BACK BY AND VERIFIED WITH: Asencion Islam PharmD 10:05 02/24/19 ([email or web address])    Culture   Final    NO GROWTH 2 DAYS Performed at [facility], [address], [place], [place] [address]    Report Status PENDING  Incomplete  Blood Culture ID Panel (Reflexed)     Status: Abnormal   Collection Time: 02/10/2019  1:20 PM  Result Value Ref Range Status   Enterococcus species NOT DETECTED NOT DETECTED Final   Listeria monocytogenes NOT DETECTED NOT DETECTED Final   Staphylococcus species DETECTED (A) NOT DETECTED Final    Comment: Methicillin (oxacillin) susceptible coagulase negative staphylococcus. Possible blood culture contaminant (unless isolated from more than one blood culture draw or clinical case suggests pathogenicity). No antibiotic treatment is indicated for blood  culture contaminants. CRITICAL RESULT CALLED TO, READ BACK BY AND VERIFIED WITH: Asencion Islam PharmD 10:05 02/24/19 ([email or web address])    Staphylococcus aureus (BCID) NOT DETECTED NOT DETECTED Final   Methicillin resistance NOT DETECTED NOT DETECTED Final   Streptococcus species NOT DETECTED NOT DETECTED Final   Streptococcus agalactiae NOT DETECTED NOT DETECTED Final   Streptococcus pneumoniae NOT DETECTED NOT DETECTED Final   Streptococcus pyogenes NOT DETECTED NOT DETECTED Final   Acinetobacter baumannii NOT DETECTED NOT DETECTED Final   Enterobacteriaceae species NOT DETECTED NOT DETECTED Final   Enterobacter cloacae complex NOT DETECTED NOT DETECTED Final   Escherichia coli NOT DETECTED NOT DETECTED Final   Klebsiella oxytoca NOT DETECTED NOT DETECTED Final   Klebsiella pneumoniae NOT DETECTED NOT DETECTED Final   Proteus species NOT DETECTED NOT DETECTED Final   Serratia marcescens NOT DETECTED NOT DETECTED Final   Haemophilus influenzae NOT DETECTED NOT DETECTED Final    Neisseria meningitidis NOT DETECTED NOT DETECTED Final   Pseudomonas aeruginosa NOT DETECTED NOT DETECTED Final   Candida albicans NOT DETECTED NOT DETECTED Final   Candida glabrata NOT DETECTED NOT DETECTED Final   Candida krusei NOT DETECTED NOT DETECTED  Final   Candida parapsilosis NOT DETECTED NOT DETECTED Final   Candida tropicalis NOT DETECTED NOT DETECTED Final    Comment: Performed at [facility], [place] [address] [place], [place] [address]  Blood Culture (routine x 2)     Status: None (Preliminary result)   Collection Time: 02/17/2019  1:35 PM  Result Value Ref Range Status   Specimen Description BLOOD RIGHT HAND  Final   Special Requests   Final    BOTTLES DRAWN AEROBIC ONLY Blood Culture results may not be optimal due to an inadequate volume of blood received in culture bottles   Culture   Final    NO GROWTH 2 DAYS Performed at [facility], [place] [address], [place], [place] [address]    Report Status PENDING  Incomplete  SARS Coronavirus 2 (CEPHEID- Performed in [facility] hospital lab), Hosp Order     Status: Abnormal   Collection Time: 02/07/2019  1:40 PM  Result Value Ref Range Status   SARS Coronavirus 2 POSITIVE (A) NEGATIVE Final    Comment: RESULT CALLED TO, READ BACK BY AND VERIFIED WITH: M. Coffey RN 14:50 01/30/2019 ([email or web address]) (NOTE) If result is NEGATIVE SARS-CoV-2 target nucleic acids are NOT DETECTED. The SARS-CoV-2 RNA is generally detectable in upper and lower  respiratory specimens during the acute phase of infection. The lowest  concentration of SARS-CoV-2 viral copies this assay can detect is 250  copies / mL. A negative result does not preclude SARS-CoV-2 infection  and should not be used as the sole basis for treatment or other  patient management decisions.  A negative result may occur with  improper specimen collection / handling, submission of specimen other  than nasopharyngeal swab, presence of viral mutation(s) within the  areas targeted  by this assay, and inadequate number of viral copies  (<250 copies / mL). A negative result must be combined with clinical  observations, patient history, and epidemiological information. If result is POSITIVE SARS-CoV-2 target nucleic acids are DETECTED.  The SARS-CoV-2 RNA is generally detectable in upper and lower  respiratory specimens during the acute phase of infection.  Positive  results are indicative of active infection with SARS-CoV-2.  Clinical  correlation with patient history and other diagnostic information is  necessary to determine patient infection status.  Positive results do  not rule out bacterial infection or co-infection with other viruses. If result is PRESUMPTIVE POSTIVE SARS-CoV-2 nucleic acids MAY BE PRESENT.   A presumptive positive result was obtained on the submitted specimen  and confirmed on repeat testing.  While 2019 novel coronavirus  (SARS-CoV-2) nucleic acids may be present in the submitted sample  additional confirmatory testing may be necessary for epidemiological  and / or clinical management purposes  to differentiate between  SARS-CoV-2 and other Sarbecovirus currently known to infect humans.  If clinically indicated additional testing with an alternate test  methodology [phone]) i s advised. The SARS-CoV-2 RNA is generally  detectable in upper and lower respiratory specimens during the acute  phase of infection. The expected result is Negative. Fact Sheet for Patients:  StrictlyIdeas.no Fact Sheet for Healthcare Providers: BankingDealers.co.za This test is not yet approved or cleared by the [place] FDA and has been authorized for detection and/or diagnosis of SARS-CoV-2 by FDA under an Emergency Use Authorization (EUA).  This EUA will remain in effect (meaning this test can be used) for the duration of the COVID-19 declaration under Section 564(b)(1) of the Act, 21 U.S.C. section  360bbb-3(b)(1),  unless the authorization is terminated or revoked sooner. Performed at [facility], [place] [address] [place], [place] [address]     Radiology Reports Dg Chest Port 1 View  Result Date: 02/10/2019 CLINICAL DATA:  Low O2 sats.  Shortness of breath. EXAM: PORTABLE CHEST 1 VIEW COMPARISON:  10/18/2018 FINDINGS: Patient is rotated to the right displacing cardiomediastinal anatomy into the medial right hemithorax. Interstitial markings are diffusely coarsened with chronic features. The cardio pericardial silhouette is enlarged. Permanent pacemaker noted. Bones are diffusely demineralized. IMPRESSION: Cardiomegaly with underlying diffuse chronic interstitial lung disease. Interstitial markings are more prominent today raising the question of interstitial edema although this may be related to positioning. No substantial pleural effusion. Electronically Signed   By: Misty Stanley M.D.   On: 02/24/2019 14:13

## 2019-02-24 NOTE — Progress Notes (Signed)
Pt refusing to eat breakfast.

## 2019-02-24 NOTE — Progress Notes (Signed)
ANTICOAGULATION CONSULT NOTE - Follow Up Consult  Pharmacy Consult for Warfarin Indication: atrial fibrillation  Allergies  Allergen Reactions  . Amiodarone Shortness Of Breath and Nausea Only    "Toxicity," also  . Flecainide Shortness Of Breath and Other (See Comments)    "Toxicity," also   . Morphine And Related Nausea And Vomiting and Other (See Comments)    Family requests no morphine due to past reaction. Pt stated it made her sick, severe nausea and vomiting  . Bactrim Ds [Sulfamethoxazole-Trimethoprim] Diarrhea  . Cephalexin Nausea And Vomiting  . Pineapple Itching    Severe itching   Labs: Recent Labs    02/19/2019 1322 02/23/19 0538 02/24/19 0425  HGB 14.4 13.2 14.1  HCT 47.0* 43.8 46.1*  PLT 198 190 197  LABPROT 29.1* 37.1* 44.0*  INR 2.8* 3.8* 4.8*  CREATININE 3.93* 3.61* 3.13*    Estimated Creatinine Clearance: 16.1 mL/min (A) (by C-G formula based on SCr of 3.13 mg/dL (H)).   Medical History: Past Medical History:  Diagnosis Date  . Acute gastric ulcer without mention of hemorrhage, perforation, or obstruction 2006   EGD  . Anxiety   . Atrial fibrillation (HCC)    flecainide; coumadin  . Breast cancer ([place])    left  . Carotid artery disease ([place])    78/29: RICA 5-62%, LICA 13-08%  //  [place] 65/78: RICA 4-69%; LICA 62-95% >> FU 1 year   . CHF (congestive heart failure) ([place])   . Chronic diastolic heart failure (HCC)    a. echo 10/10: mild LVH, EF 55-60%, mild AI, mild MR, mild to mod LAE, mild RVE, severe RAE, mod TR, PASP 53  /  b.  Echo 3/14:  Mild LVH, EF 55%, Tr AI, MAC, mild MR, mod LAE, PASP 31, trivial eff. // c. echo 1/17: EF 55-60%, mild AI, MAC, mild MR, moderate BAE, moderate TR  . Chronic lower back pain   . Dementia without behavioral disturbance ([place]) 11/24/2018  . Diabetic peripheral neuropathy ([place]) 03/09/2015  . Diverticulosis   . Esophagitis, unspecified 2006   EGD  . Hiatal hernia 2006   EGD  . History of nuclear stress test     a. Myoview 2/12: EF 69%, no scar or ischemia  . Hx of adenomatous colonic polyps   . Hypertension   . Mediastinal lymphadenopathy   . Memory difficulty 11/14/2016  . Mitral regurgitation    mild, echo, October, 2010  . Osteopenia   . Presence of permanent cardiac pacemaker   . Pulmonary embolus [facility]) 2006   2006, with DVT  . Pulmonary HTN ([place])    53 mmHg echo, 2010, moderate TR, mild right ventricular enlargement  . Tachy-brady syndrome ([place])    s/p pacer 07/2009  . Thyroid nodule    non-neoplastic goiter  . Type II diabetes mellitus ([place])   . Venous insufficiency    Chronic    Medications:  Scheduled:  . bethanechol  25 mg Oral BID  . diltiazem  120 mg Oral Daily  . famotidine  20 mg Oral Daily  . feeding supplement (PRO-STAT SUGAR FREE 64)  30 mL Oral TID WC  . mouth rinse  15 mL Mouth Rinse BID  . memantine  5 mg Oral BID  . [START ON 02/25/2019] methylPREDNISolone (SOLU-MEDROL) injection  40 mg Intravenous Daily  . metoprolol succinate  50 mg Oral Daily  . QUEtiapine  25 mg Oral Daily  . sodium chloride flush  3 mL Intravenous Q12H  .  vitamin C  500 mg Oral Daily  . Warfarin - Pharmacist Dosing Inpatient   Does not apply q1800  . zinc sulfate  220 mg Oral Daily   Infusions:  . ceFEPime (MAXIPIME) IV Stopped (02/23/19 2021)  . dextrose 75 mL/hr at 02/24/19 1124  . tocilizumab (ACTEMRA) IV      Assessment: Pt is a 48 yoF on warfarin PTA for atrial fibrillation. PTA dose variable (1-2 mg daily). Most recently, she was taking 2 mg daily, last administered 5/24. Per nursing [facility], she was started on Levaquin 5/16, last dose yesterday 5/24. INR 2.8 on admission   INR has trended up to 4.8 today despite only getting 1 dose on 5/25 and holding dose yesterday. Likely due to poor oral intake. H/H and Plt remain stable. MD ordered 1 dose of IV vitamin K 0.5 mg today.   Goal of Therapy:  INR 2-3 Monitor platelets by anticoagulation protocol: Yes   Plan:  No coumadin  today Daily INR and CBC. Monitor for signs/symptoms of bleeding   Albertina Parr, PharmD., BCPS Clinical Pharmacist Clinical phone for 02/24/2019 until 5pm: [phone]

## 2019-02-24 NOTE — Progress Notes (Signed)
CRITICAL VALUE ALERT  Critical Value:  INR 4.8  Date & Time Notied:  02/24/2019 0804  Provider Notified: 02/24/2019 804  Orders Received/Actions taken:

## 2019-02-24 NOTE — Progress Notes (Addendum)
Patient refused her inhaler and most of her medications. She also refuses mouth care. She clinches her mouth shut when trying to do mouth care. Will continue to monitor.

## 2019-02-24 NOTE — Progress Notes (Signed)
PHARMACY - PHYSICIAN COMMUNICATION CRITICAL VALUE ALERT - BLOOD CULTURE IDENTIFICATION (BCID)  [name] [name] is an 85 y.o. female who presented to [facility] on 02/02/2019 with a chief complaint of low oxygenation  Assessment: Patient was diagnosed with COVID. 1/3 BCx with GPC. BCID identified as coag neg staph with no resistance patterns  Name of physician (or Provider) Contacted: Dr. Candiss Norse   Current antibiotics: Cefepime 2 gm IV Q 24 hours for sepsis   Changes to prescribed antibiotics recommended:  Patient is on recommended antibiotics - No changes needed  Results for orders placed or performed during the hospital encounter of 02/18/2019  Blood Culture ID Panel (Reflexed) (Collected: 01/31/2019  1:20 PM)  Result Value Ref Range   Enterococcus species NOT DETECTED NOT DETECTED   Listeria monocytogenes NOT DETECTED NOT DETECTED   Staphylococcus species DETECTED (A) NOT DETECTED   Staphylococcus aureus (BCID) NOT DETECTED NOT DETECTED   Methicillin resistance NOT DETECTED NOT DETECTED   Streptococcus species NOT DETECTED NOT DETECTED   Streptococcus agalactiae NOT DETECTED NOT DETECTED   Streptococcus pneumoniae NOT DETECTED NOT DETECTED   Streptococcus pyogenes NOT DETECTED NOT DETECTED   Acinetobacter baumannii NOT DETECTED NOT DETECTED   Enterobacteriaceae species NOT DETECTED NOT DETECTED   Enterobacter cloacae complex NOT DETECTED NOT DETECTED   Escherichia coli NOT DETECTED NOT DETECTED   Klebsiella oxytoca NOT DETECTED NOT DETECTED   Klebsiella pneumoniae NOT DETECTED NOT DETECTED   Proteus species NOT DETECTED NOT DETECTED   Serratia marcescens NOT DETECTED NOT DETECTED   Haemophilus influenzae NOT DETECTED NOT DETECTED   Neisseria meningitidis NOT DETECTED NOT DETECTED   Pseudomonas aeruginosa NOT DETECTED NOT DETECTED   Candida albicans NOT DETECTED NOT DETECTED   Candida glabrata NOT DETECTED NOT DETECTED   Candida krusei NOT DETECTED NOT DETECTED   Candida  parapsilosis NOT DETECTED NOT DETECTED   Candida tropicalis NOT DETECTED NOT DETECTED    Albertina Parr, PharmD., BCPS Clinical Pharmacist

## 2019-02-24 NOTE — Progress Notes (Addendum)
Patient has been consistently pulling off gown, telemetry, O2, and constantly moving in bed. Sometimes it takes 3 staff members to clean patient due to patient being strong and trying to keep [place] from cleaning her up. She screams due to pain when we are cleaning her up and we are being gentle  Patient has perineum and buttocks MASD. Barrier cream applied to areas of concern. Patient is not easily redirected due to severe dementia.  Mittens placed for safety. Paged Dr. Bonner Puna.   New order for haldol 1mg  IV PRN ordered and bilateral soft wrist restraints. Haldol given first to see if effective before placing restraints. Will continue to monitor.   Patient has calmed somewhat from the haldol given. Will hold off on initiating restraints. Will keep order for restraints. Notified Dr. Bonner Puna. Will continue to monitor.

## 2019-02-24 NOTE — Consult Note (Signed)
   [facility] CM Inpatient Consult   02/24/2019  [name] Brickman January 04, 1931 [id number]    Patient's chart reviewed forextremehigh risk scoreof 47% for unplanned readmission and hospitalizationunder her [place]; andto check if potential [place] Management services are needed.Patient was outreached by [facility] care management coordinators, [facility] social worker and engaged by [facility] health coach for disease management and community resources support in the past.  Per chart review and MD narrative dated 02/24/19, shows as: Ms.[name] a 85 y.o.femalewith medical history significant of severe underlying dementia,A. fib, DVT/ PE on Coumadin, tachy-brady syndrome s/p PPM, breast cancers/pmastectomy, chronic diastolic heart failure, DM type II, PAH, CKD stage IV, and frequent UTIs; who presented with reports of low oxygenation from [facility], she was diagnosed with COVID-19 pneumonitis and admitted to the [facility] hospital.  Primary Care Provider [place] [facility] [place], listed as providing transition of care follow-up.  Current review of dispositionperMD note showsthat patient will likelytransition back to SNF(skilled nursing facility). Patient is from [facility] and chart review states that patient is in [place] term section per daughter.  If there are changes in disposition, please place a [place] Management consult for follow-up as appropriate.   Of note, [facility] Care Management services does not replace or interfere with any services that are arranged by transition of care case management or social work.    For questions and additional information, please call:  Lovina Zuver A. Hyde Sires, BSN, RN-BC [facility] Liaison Cell: [phone]

## 2019-02-25 ENCOUNTER — Inpatient Hospital Stay (HOSPITAL_COMMUNITY): Payer: Medicare HMO

## 2019-02-25 ENCOUNTER — Inpatient Hospital Stay: Payer: Self-pay

## 2019-02-25 LAB — COMPREHENSIVE METABOLIC PANEL
ALT: 10 U/L (ref 0–44)
AST: 18 U/L (ref 15–41)
Albumin: 2.2 g/dL — ABNORMAL LOW (ref 3.5–5.0)
Alkaline Phosphatase: 105 U/L (ref 38–126)
Anion gap: 14 (ref 5–15)
BUN: 75 mg/dL — ABNORMAL HIGH (ref 8–23)
CO2: 15 mmol/L — ABNORMAL LOW (ref 22–32)
Calcium: 8.8 mg/dL — ABNORMAL LOW (ref 8.9–10.3)
Chloride: 113 mmol/L — ABNORMAL HIGH (ref 98–111)
Creatinine, Ser: 2.78 mg/dL — ABNORMAL HIGH (ref 0.44–1.00)
GFR calc Af Amer: 17 mL/min — ABNORMAL LOW (ref >60)
GFR calc non Af Amer: 15 mL/min — ABNORMAL LOW (ref >60)
Glucose, Bld: 274 mg/dL — ABNORMAL HIGH (ref 70–99)
Potassium: 4.3 mmol/L (ref 3.5–5.1)
Sodium: 142 mmol/L (ref 135–145)
Total Bilirubin: 1.2 mg/dL (ref 0.3–1.2)
Total Protein: 6.6 g/dL (ref 6.5–8.1)

## 2019-02-25 LAB — CBC WITH DIFFERENTIAL/PLATELET
Abs Immature Granulocytes: 0.09 10*3/uL — ABNORMAL HIGH (ref 0.00–0.07)
Basophils Absolute: 0 10*3/uL (ref 0.0–0.1)
Basophils Relative: 0 %
Eosinophils Absolute: 0 10*3/uL (ref 0.0–0.5)
Eosinophils Relative: 0 %
HCT: 46.2 % — ABNORMAL HIGH (ref 36.0–46.0)
Hemoglobin: 13.9 g/dL (ref 12.0–15.0)
Immature Granulocytes: 2 %
Lymphocytes Relative: 8 %
Lymphs Abs: 0.3 10*3/uL — ABNORMAL LOW (ref 0.7–4.0)
MCH: 25.8 pg — ABNORMAL LOW (ref 26.0–34.0)
MCHC: 30.1 g/dL (ref 30.0–36.0)
MCV: 85.7 fL (ref 80.0–100.0)
Monocytes Absolute: 0.1 10*3/uL (ref 0.1–1.0)
Monocytes Relative: 4 %
Neutro Abs: 3.2 10*3/uL (ref 1.7–7.7)
Neutrophils Relative %: 86 %
Platelets: 195 10*3/uL (ref 150–400)
RBC: 5.39 MIL/uL — ABNORMAL HIGH (ref 3.87–5.11)
RDW: 17.2 % — ABNORMAL HIGH (ref 11.5–15.5)
WBC: 3.7 10*3/uL — ABNORMAL LOW (ref 4.0–10.5)
nRBC: 0 % (ref 0.0–0.2)

## 2019-02-25 LAB — GLUCOSE, CAPILLARY
Glucose-Capillary: 256 mg/dL — ABNORMAL HIGH (ref 70–99)
Glucose-Capillary: 236 mg/dL — ABNORMAL HIGH (ref 70–99)
Glucose-Capillary: 179 mg/dL — ABNORMAL HIGH (ref 70–99)

## 2019-02-25 LAB — C-REACTIVE PROTEIN: CRP: 13.6 mg/dL — ABNORMAL HIGH (ref <1.0)

## 2019-02-25 LAB — PROTIME-INR
INR: 2.7 — ABNORMAL HIGH (ref 0.8–1.2)
Prothrombin Time: 28.6 seconds — ABNORMAL HIGH (ref 11.4–15.2)

## 2019-02-25 LAB — TSH: TSH: 1.468 u[IU]/mL (ref 0.350–4.500)

## 2019-02-25 LAB — FERRITIN: Ferritin: 249 ng/mL (ref 11–307)

## 2019-02-25 LAB — CORTISOL: Cortisol, Plasma: 11.2 ug/dL

## 2019-02-25 LAB — D-DIMER, QUANTITATIVE: D-Dimer, Quant: 3.92 ug/mL-FEU — ABNORMAL HIGH (ref 0.00–0.50)

## 2019-02-25 MED ORDER — METHYLPREDNISOLONE SODIUM SUCC 40 MG IJ SOLR
20.0000 mg | Freq: Every day | INTRAMUSCULAR | Status: DC
  Administered 2019-02-26 – 2019-02-27 (×2): 20 mg via INTRAVENOUS
  Filled 2019-02-25 (×2): qty 1

## 2019-02-25 MED ORDER — SODIUM CHLORIDE 0.9% FLUSH: 10.0000 mL | Freq: Two times a day (BID) | INTRAVENOUS | Status: DC

## 2019-02-25 MED ORDER — INSULIN ASPART 100 UNIT/ML ~~LOC~~ SOLN
0.0000 [IU] | Freq: Four times a day (QID) | SUBCUTANEOUS | Status: DC
  Administered 2019-02-25: 8 [IU] via SUBCUTANEOUS
  Administered 2019-02-25: 23:00:00 3 [IU] via SUBCUTANEOUS
  Administered 2019-02-25: 18:00:00 5 [IU] via SUBCUTANEOUS
  Administered 2019-02-26: 3 [IU] via SUBCUTANEOUS
  Administered 2019-02-26: 16:00:00 5 [IU] via SUBCUTANEOUS
  Administered 2019-02-26: 04:00:00 2 [IU] via SUBCUTANEOUS
  Administered 2019-02-26: 3 [IU] via SUBCUTANEOUS
  Administered 2019-02-27: 2 [IU] via SUBCUTANEOUS
  Administered 2019-02-27 (×3): 3 [IU] via SUBCUTANEOUS
  Administered 2019-02-28 (×2): 5 [IU] via SUBCUTANEOUS

## 2019-02-25 MED ORDER — SODIUM CHLORIDE 0.9% FLUSH: 10.0000 mL | INTRAVENOUS | Status: DC | PRN

## 2019-02-25 MED ORDER — WARFARIN SODIUM 1 MG PO TABS
1.0000 mg | ORAL_TABLET | Freq: Once | ORAL | Status: AC
  Administered 2019-02-25: 18:00:00 1 mg via ORAL
  Filled 2019-02-25: qty 1

## 2019-02-25 MED ORDER — LACTATED RINGERS IV SOLN
INTRAVENOUS | Status: AC
  Administered 2019-02-25: 17:00:00 via INTRAVENOUS

## 2019-02-25 NOTE — Progress Notes (Signed)
  Speech Language Pathology Treatment: Dysphagia  Patient Details Name: [name] [name] MRN: [id number] DOB: [birth year] Today's Date: 02/25/2019 Time: 0951-1000 SLP Time Calculation (min) (ACUTE ONLY): 9 min  Assessment / Plan / Recommendation Clinical Impression  Pt with improved LOA from yesterday.  Continues on room air. Eating minimally per RN.  Pt requires verbal cues to sip from side of cup and to use a straw.  She demonstrated adequate oral attention/manipulation; she accepted 1/2 tspn sized boluses of puree from a spoon; there was no coughing, no s/s of aspiration.  Appears to tolerate diet well but motivation to eat, appetite are impacted.  Continue dysphagia 2, thin liquids; meds whole with puree.    HPI HPI: Pt is an 85 y.o. female admitted from SNF with hypoxia from COVID-19. PMH includes dementia, GERD, HH, afib, DVT/PE, tachy-brady syndrome s/p PPM, breast cancer s/p mastectomy, chronic diastolic heart failure, DM type II, PAH, CKD stage IV, and frequent UTIs      SLP Plan  Continue with current plan of care       Recommendations  Diet recommendations: Dysphagia 2 (fine chop);Thin liquid Liquids provided via: Cup;Straw Medication Administration: Whole meds with puree Supervision: Staff to assist with self feeding Compensations: Slow rate;Small sips/bites;Minimize environmental distractions;Follow solids with liquid                Oral Care Recommendations: Oral care BID Follow up Recommendations: Skilled Nursing facility SLP Visit Diagnosis: Dysphagia, oral phase (R13.11) Plan: Continue with current plan of care       GO             Wake Conlee L. Tivis Ringer, [place] Office number [phone] Pager [phone]    Juan Quam Laurice 02/25/2019, 10:10 AM

## 2019-02-25 NOTE — Progress Notes (Signed)
Peripherally Inserted Central Catheter/Midline Placement  The IV Nurse has discussed with the patient and/or persons authorized to consent for the patient, the purpose of this procedure and the potential benefits and risks involved with this procedure.  The benefits include less needle sticks, lab draws from the catheter, and the patient may be discharged home with the catheter. Risks include, but not limited to, infection, bleeding, blood clot (thrombus formation), and puncture of an artery; nerve damage and irregular heartbeat and possibility to perform a PICC exchange if needed/ordered by physician.  Alternatives to this procedure were also discussed.  Bard Power PICC patient education guide, fact sheet on infection prevention and patient information card has been provided to patient /or left at bedside.    PICC/Midline Placement Documentation  PICC Double Lumen 00/71/21 PICC Right Basilic 39 cm 1 cm (Active)  Indication for Insertion or Continuance of Line Poor Vasculature-patient has had multiple peripheral attempts or PIVs lasting less than 24 hours 02/25/2019  4:00 PM  Exposed Catheter (cm) 1 cm 02/25/2019  4:00 PM  Site Assessment Clean;Dry;Intact 02/25/2019  4:00 PM  Lumen #1 Status Flushed;Saline locked;Blood return noted 02/25/2019  4:00 PM  Lumen #2 Status Flushed;Saline locked;Blood return noted 02/25/2019  4:00 PM  Dressing Type Transparent;Securing device 02/25/2019  4:00 PM  Dressing Status Clean;Dry;Intact;Antimicrobial disc in place 02/25/2019  4:00 PM  Line Care Connections checked and tightened 02/25/2019  4:00 PM  Dressing Intervention New dressing 02/25/2019  4:00 PM  Dressing Change Due 03/04/19 02/25/2019  4:00 PM       [name] 02/25/2019, 4:14 PM

## 2019-02-25 NOTE — Progress Notes (Signed)
Spoke with Engineer, maintenance. Patient will need to nurses to place the PICC. Non emergent will be place when 2 nurses are available.

## 2019-02-25 NOTE — Progress Notes (Signed)
ANTICOAGULATION CONSULT NOTE - Follow Up Consult  Pharmacy Consult for Warfarin Indication: atrial fibrillation  Allergies  Allergen Reactions  . Amiodarone Shortness Of Breath and Nausea Only    "Toxicity," also  . Flecainide Shortness Of Breath and Other (See Comments)    "Toxicity," also   . Morphine And Related Nausea And Vomiting and Other (See Comments)    Family requests no morphine due to past reaction. Pt stated it made her sick, severe nausea and vomiting  . Bactrim Ds [Sulfamethoxazole-Trimethoprim] Diarrhea  . Cephalexin Nausea And Vomiting  . Pineapple Itching    Severe itching   Labs: Recent Labs    02/23/19 0538 02/24/19 0425 02/25/19 0400  HGB 13.2 14.1 13.9  HCT 43.8 46.1* 46.2*  PLT 190 197 195  LABPROT 37.1* 44.0* 28.6*  INR 3.8* 4.8* 2.7*  CREATININE 3.61* 3.13* 2.78*    Estimated Creatinine Clearance: 18.1 mL/min (A) (by C-G formula based on SCr of 2.78 mg/dL (H)).   Medical History: Past Medical History:  Diagnosis Date  . Acute gastric ulcer without mention of hemorrhage, perforation, or obstruction 2006   EGD  . Anxiety   . Atrial fibrillation (HCC)    flecainide; coumadin  . Breast cancer ([place])    left  . Carotid artery disease ([place])    48/25: RICA 0-03%, LICA 70-48%  //  [place] 88/91: RICA 6-94%; LICA 50-38% >> FU 1 year   . CHF (congestive heart failure) ([place])   . Chronic diastolic heart failure (HCC)    a. echo 10/10: mild LVH, EF 55-60%, mild AI, mild MR, mild to mod LAE, mild RVE, severe RAE, mod TR, PASP 53  /  b.  Echo 3/14:  Mild LVH, EF 55%, Tr AI, MAC, mild MR, mod LAE, PASP 31, trivial eff. // c. echo 1/17: EF 55-60%, mild AI, MAC, mild MR, moderate BAE, moderate TR  . Chronic lower back pain   . Dementia without behavioral disturbance ([place]) 11/24/2018  . Diabetic peripheral neuropathy ([place]) 03/09/2015  . Diverticulosis   . Esophagitis, unspecified 2006   EGD  . Hiatal hernia 2006   EGD  . History of nuclear stress test     a. Myoview 2/12: EF 69%, no scar or ischemia  . Hx of adenomatous colonic polyps   . Hypertension   . Mediastinal lymphadenopathy   . Memory difficulty 11/14/2016  . Mitral regurgitation    mild, echo, October, 2010  . Osteopenia   . Presence of permanent cardiac pacemaker   . Pulmonary embolus [facility]) 2006   2006, with DVT  . Pulmonary HTN ([place])    53 mmHg echo, 2010, moderate TR, mild right ventricular enlargement  . Tachy-brady syndrome ([place])    s/p pacer 07/2009  . Thyroid nodule    non-neoplastic goiter  . Type II diabetes mellitus ([place])   . Venous insufficiency    Chronic    Medications:  Scheduled:  . bethanechol  25 mg Oral BID  . diltiazem  120 mg Oral Daily  . famotidine  20 mg Oral Daily  . feeding supplement (PRO-STAT SUGAR FREE 64)  30 mL Oral TID WC  . insulin aspart  0-15 Units Subcutaneous Q6H  . mouth rinse  15 mL Mouth Rinse BID  . memantine  5 mg Oral BID  . [START ON 02/26/2019] methylPREDNISolone (SOLU-MEDROL) injection  20 mg Intravenous Daily  . metoprolol succinate  50 mg Oral Daily  . QUEtiapine  25 mg Oral Daily  . sodium  chloride flush  3 mL Intravenous Q12H  . vitamin C  500 mg Oral Daily  . Warfarin - Pharmacist Dosing Inpatient   Does not apply q1800  . zinc sulfate  220 mg Oral Daily   Infusions:  . ceFEPime (MAXIPIME) IV Stopped (02/24/19 2021)  . lactated ringers      Assessment: Pt is a 35 yoF on warfarin PTA for atrial fibrillation. PTA dose variable (1-2 mg daily). Most recently, she was taking 2 mg daily, last administered 5/24. Per nursing [facility], she was started on Levaquin 5/16, last dose yesterday 5/24. INR 2.8 on admission   INR is back to 2.7 after a dose of vit k 5/27. We will resume with low dose coumadin today. INR might drop more tomorrow.   Goal of Therapy:  INR 2-3 Monitor platelets by anticoagulation protocol: Yes   Plan:  Coumadin 42m PO x1 Daily INR and CBC. Monitor for signs/symptoms of bleeding   MOnnie Boer  PharmD, BCIDP, AAHIVP, CPP Infectious Disease Pharmacist 02/25/2019 1:16 PM

## 2019-02-25 NOTE — Progress Notes (Addendum)
PROGRESS NOTE                                                                                                                                                                                                             Patient Demographics:    [name] [name], is a 85 y.o. female, DOB - [birth year], RWE:[id number]  Outpatient Primary MD for the patient is [name] Koch, MD    LOS - 3  Admit date - 02/05/2019    Chief Complaint  Patient presents with   low oxygen   lethargic       Brief Narrative    [name] [name] is a 85 y.o. female with medical history significant of severe underlying dementia, A. fib, DVT/PE on Coumadin, tachy-brady  syndrome s/p PPM, breast cancer s/p mastectomy, chronic diastolic heart failure, DM type II, PAH, CKD stage IV, and frequent UTIs; who [facility] with [facility] of [facility], she was diagnosed with COVID-19 pneumonitis and admitted to the [facility] hospital.   Subjective:   Patient in bed, slightly more awake than 02/24/2019, denies any headache chest or abdominal pain, states she feels weak.  Answered a few basic questions.   Assessment  & Plan :     1. Acute Hypoxic Resp. Failure due to Acute Covid 19 Viral Illness during the ongoing 2020 Covid 19 Pandemic -  CXR suggests pneumonitis with mild hypoxia requiring 1 to 2 L oxygen, inflammatory markers borderline, placed on IV Solu-Medrol and Actemra was given on 02/24/2019, her oxygenation is stable and she is currently between 1 to 2 L nasal cannula oxygen.  I-S flutter valve if she can use and tolerate for pulmonary toiletry.  Overall extremely frail and due to underlying dementia and now due to toxic encephalopathy unable to take proper diet or participate in pulmonary toiletry which makes her condition more tenuous.  Need to monitor closely.  If she declines further family okay for comfort measures.   ABG       Component Value Date/Time   PHART 7.497 (H) 10/18/2018 1417   PCO2ART 28.3 (L) 10/18/2018 1417   PO2ART 62.0 (L) 10/18/2018 1417   HCO3 21.9 10/18/2018 1417   TCO2 23 10/18/2018 1417   O2SAT 94.0 10/18/2018 1417    COVID-19 Labs  Recent Labs    02/23/19 0524 02/23/19 0538 02/24/19  0425 02/24/19 0430 02/25/19 0400  DDIMER  --  1.78* 1.98*  --  3.92*  FERRITIN 300  --   --  276 249  CRP 16.0*  --   --  18.2* 13.6*    Lab Results  Component Value Date   SARSCOV2NAA POSITIVE (A) 02/16/2019        Component Value Date/Time   BNP 718.4 (H) 02/23/2019 0548   BNP 607.5 (H) 09/27/2015 1500      2.  Acute on chronic combined systolic and diastolic CHF, recent echo 45% - clinically does have a few Rales and BNP is elevated, IV Lasix on 02/23/2019, currently on diltiazem which will be continued and will monitor closely.  3.  Chronic dementia with delirium and toxic encephalopathy.  On Namenda which will be continued, still getting quite agitated at nighttime and aggressive towards the staff, low-dose Haldol given on 02/23/2019 night seems to have made her very somnolent, will try nighttime Seroquel instead, soft mittens for patient and staff protection.  Avoid benzodiazepines and narcotics.  4.  History of DVT PE.  On Coumadin per pharmacy, due to poor oral intake and relative depletion and vitamin K levels INR is 4.8 on 02/24/2019, low-dose IV vitamin K x1 and monitor.  5.  Chronic atrial fibrillation [place] vas 2 score of at least 4.  On diltiazem Coumadin.  Low-dose beta-blocker tomorrow if blood pressure tolerates, given half of home dose, continue to monitor closely.  6.  ARF on CKD stage IV.  Baseline creatinine 2.3.  Hydrate and monitor.  Oral intake has been poor, placed on gentle LR will monitor, renal function mildly improved.   7.  GERD.  Placed on PPI.  8.  UTI.  Currently on Maxipime due to multiple drug allergies will monitor.    9.  Source of hypothermia, she is on  Maxipime, blood cultures overall clinically negative except for 1 out of 2 contamination with coag negative staph.  She clinically has shown improvement since 02/24/2019, will check TSH and random cortisol.  Bear hugger and supportive care and monitor.    10. DM type II.  Currently in poor control due to hypoglycemia.  Stop all insulin occluding home dose Levemir and sliding scale here, gentle D5W drip.  Also placed on Solu-Medrol for #1 above.  Monitor.  Lab Results  Component Value Date   HGBA1C 6.3 (H) 10/14/2016   CBG (last 3)  Recent Labs    02/24/19 1721 02/24/19 2143 02/25/19 0830  GLUCAP 248* 248* 256*     Condition -  Guarded  Family Communication  :  Daughter understands the risks and benefits of using Actemra, accepts it and would like to use it if needed, DNR, overall prognosis remains guarded daughter updated again on 02/24/2019.  If declines full comfort.  Called daughter on 02/25/2019 at 11:30 AM.  No response.  Code Status :  DNR  Diet : Soft  Disposition Plan  :  SNF  Consults  :  None  Procedures  :  None  PUD Prophylaxis : PPI  DVT Prophylaxis  :  Coumadin  Lab Results  Component Value Date   INR 2.7 (H) 02/25/2019   INR 4.8 (HH) 02/24/2019   INR 3.8 (H) 02/23/2019     Lab Results  Component Value Date   PLT 195 02/25/2019    Inpatient Medications  Scheduled Meds:  bethanechol  25 mg Oral BID   diltiazem  120 mg Oral Daily   famotidine  20  mg Oral Daily   feeding supplement (PRO-STAT SUGAR FREE 64)  30 mL Oral TID WC   insulin aspart  0-15 Units Subcutaneous Q6H   mouth rinse  15 mL Mouth Rinse BID   memantine  5 mg Oral BID   [START ON 02/26/2019] methylPREDNISolone (SOLU-MEDROL) injection  20 mg Intravenous Daily   metoprolol succinate  50 mg Oral Daily   QUEtiapine  25 mg Oral Daily   sodium chloride flush  3 mL Intravenous Q12H   vitamin C  500 mg Oral Daily   Warfarin - Pharmacist Dosing Inpatient   Does not apply q1800     zinc sulfate  220 mg Oral Daily   Continuous Infusions:  ceFEPime (MAXIPIME) IV Stopped (02/24/19 2021)   lactated ringers     PRN Meds:.albuterol, guaiFENesin-dextromethorphan, [DISCONTINUED] ondansetron **OR** ondansetron (ZOFRAN) IV  Antibiotics  :    Anti-infectives (From admission, onward)   Start     Dose/Rate Route Frequency Ordered Stop   02/24/19 2000  vancomycin (VANCOCIN) IVPB 750 mg/150 ml premix  Status:  Discontinued     750 mg 150 mL/hr over 60 Minutes Intravenous Every 48 hours 02/21/2019 1930 02/21/2019 2322   02/23/19 2000  ceFEPIme (MAXIPIME) 2 g in sodium chloride 0.9 % 100 mL IVPB     2 g 200 mL/hr over 30 Minutes Intravenous Every 24 hours 02/18/2019 1930     02/06/2019 2030  metroNIDAZOLE (FLAGYL) IVPB 500 mg  Status:  Discontinued     500 mg 100 mL/hr over 60 Minutes Intravenous Every 8 hours 01/29/2019 1912 02/23/19 0932   02/02/2019 2015  ceFEPIme (MAXIPIME) 2 g in sodium chloride 0.9 % 100 mL IVPB     2 g 200 mL/hr over 30 Minutes Intravenous  Once 02/13/2019 1912 02/08/2019 2132   02/06/2019 1930  vancomycin (VANCOCIN) 1,750 mg in sodium chloride 0.9 % 500 mL IVPB  Status:  Discontinued     1,750 mg 250 mL/hr over 120 Minutes Intravenous  Once 01/30/2019 1929 01/29/2019 2322   01/31/2019 1915  vancomycin (VANCOCIN) IVPB 1000 mg/200 mL premix  Status:  Discontinued     1,000 mg 200 mL/hr over 60 Minutes Intravenous  Once 01/29/2019 1912 02/16/2019 1929       Time Spent in minutes  30   Lala Lund M.D on 02/25/2019 at 11:29 AM  To page go to [email or web address] - password [facility]  [facility] -  Office  [phone]   See all Orders from today for further details    Objective:   Vitals:   02/25/19 0000 02/25/19 0500 02/25/19 0853 02/25/19 0858  BP: 118/87 118/84 124/80   Pulse:    74  Resp:      Temp: (!) 93.3 F (34.1 C) (!) 95.9 F (35.5 C) (!) 91.1 F (32.8 C)   TempSrc: Rectal Axillary Rectal   SpO2:      Weight:        Wt Readings from Last 3  Encounters:  02/03/2019 88 kg  11/25/18 100.4 kg  10/27/18 102.7 kg     Intake/Output Summary (Last 24 hours) at 02/25/2019 1129 Last data filed at 02/25/2019 0954 Gross per 24 hour  Intake 348.96 ml  Output 1700 ml  Net -1351.04 ml     Physical Exam  Somnolent but actually slightly improved since 02/24/2019, still appears quite frail and weak, [place].AT,PERRAL Supple Neck,No JVD, No cervical lymphadenopathy appriciated.  Symmetrical Chest wall movement, Good air movement bilaterally, CTAB RRR,No Gallops, Rubs or new Murmurs,  No Parasternal Heave +ve B.Sounds, Abd Soft, No tenderness, No organomegaly appriciated, No rebound - guarding or rigidity. No Cyanosis, Clubbing or edema, No new Rash or bruise    Data Review:    CBC Recent Labs  Lab 01/29/2019 1322 02/23/19 0538 02/24/19 0425 02/25/19 0400  WBC 4.7 4.8 3.9* 3.7*  HGB 14.4 13.2 14.1 13.9  HCT 47.0* 43.8 46.1* 46.2*  PLT 198 190 197 195  MCV 85.6 87.3 86.5 85.7  MCH 26.2 26.3 26.5 25.8*  MCHC 30.6 30.1 30.6 30.1  RDW 16.4* 16.9* 17.0* 17.2*  LYMPHSABS 0.4* 0.4* 0.2* 0.3*  MONOABS 0.2 0.1 0.1 0.1  EOSABS 0.0 0.0 0.0 0.0  BASOSABS 0.0 0.0 0.0 0.0    Chemistries  Recent Labs  Lab 02/04/2019 1322 02/23/19 0538 02/24/19 0425 02/25/19 0400  NA 141 143 142 142  K 4.4 4.2 4.1 4.3  CL 106 112* 112* 113*  CO2 20* 19* 13* 15*  GLUCOSE 97 49* 232* 274*  BUN 79* 76* 72* 75*  CREATININE 3.93* 3.61* 3.13* 2.78*  CALCIUM 8.4* 8.0* 8.3* 8.8*  AST 37 28 31 18   ALT 11 11 6 10   ALKPHOS 125 104 108 105  BILITOT 1.2 0.8 0.9 1.2   ------------------------------------------------------------------------------------------------------------------ Recent Labs    02/23/19 0538  TRIG 98    Lab Results  Component Value Date   HGBA1C 6.3 (H) 10/14/2016   ------------------------------------------------------------------------------------------------------------------ No results for input(s): TSH, T4TOTAL, T3FREE, THYROIDAB  in the last 72 hours.  Invalid input(s): FREET3  Cardiac Enzymes No results for input(s): CKMB, TROPONINI, MYOGLOBIN in the last 168 hours.  Invalid input(s): CK ------------------------------------------------------------------------------------------------------------------    Component Value Date/Time   BNP 718.4 (H) 02/23/2019 0548   BNP 607.5 (H) 09/27/2015 1500    Micro Results Recent Results (from the past 240 hour(s))  Blood Culture (routine x 2)     Status: None (Preliminary result)   Collection Time: 02/03/2019  1:20 PM  Result Value Ref Range Status   Specimen Description BLOOD RIGHT ANTECUBITAL  Final   Special Requests   Final    BOTTLES DRAWN AEROBIC AND ANAEROBIC Blood Culture results may not be optimal due to an inadequate volume of blood received in culture bottles   Culture  Setup Time   Final    GRAM POSITIVE COCCI AEROBIC BOTTLE ONLY CRITICAL RESULT CALLED TO, READ BACK BY AND VERIFIED WITH: Asencion Islam PharmD 10:05 02/24/19 ([email or web address]) Performed at [facility], [place] [address] [place],  [address]    Culture GRAM POSITIVE COCCI  Final   Report Status PENDING  Incomplete  Blood Culture ID Panel (Reflexed)     Status: Abnormal   Collection Time: 01/30/2019  1:20 PM  Result Value Ref Range Status   Enterococcus species NOT DETECTED NOT DETECTED Final   Listeria monocytogenes NOT DETECTED NOT DETECTED Final   Staphylococcus species DETECTED (A) NOT DETECTED Final    Comment: Methicillin (oxacillin) susceptible coagulase negative staphylococcus. Possible blood culture contaminant (unless isolated from more than one blood culture draw or clinical case suggests pathogenicity). No antibiotic treatment is indicated for blood  culture contaminants. CRITICAL RESULT CALLED TO, READ BACK BY AND VERIFIED WITH: Asencion Islam PharmD 10:05 02/24/19 ([email or web address])    Staphylococcus aureus (BCID) NOT DETECTED NOT DETECTED Final   Methicillin resistance NOT DETECTED NOT  DETECTED Final   Streptococcus species NOT DETECTED NOT DETECTED Final   Streptococcus agalactiae NOT DETECTED NOT DETECTED Final   Streptococcus pneumoniae NOT DETECTED NOT DETECTED Final  Streptococcus pyogenes NOT DETECTED NOT DETECTED Final   Acinetobacter baumannii NOT DETECTED NOT DETECTED Final   Enterobacteriaceae species NOT DETECTED NOT DETECTED Final   Enterobacter cloacae complex NOT DETECTED NOT DETECTED Final   Escherichia coli NOT DETECTED NOT DETECTED Final   Klebsiella oxytoca NOT DETECTED NOT DETECTED Final   Klebsiella pneumoniae NOT DETECTED NOT DETECTED Final   Proteus species NOT DETECTED NOT DETECTED Final   Serratia marcescens NOT DETECTED NOT DETECTED Final   Haemophilus influenzae NOT DETECTED NOT DETECTED Final   Neisseria meningitidis NOT DETECTED NOT DETECTED Final   Pseudomonas aeruginosa NOT DETECTED NOT DETECTED Final   Candida albicans NOT DETECTED NOT DETECTED Final   Candida glabrata NOT DETECTED NOT DETECTED Final   Candida krusei NOT DETECTED NOT DETECTED Final   Candida parapsilosis NOT DETECTED NOT DETECTED Final   Candida tropicalis NOT DETECTED NOT DETECTED Final    Comment: Performed at [facility], [place] [address] [place], [place] [address]  Blood Culture (routine x 2)     Status: None (Preliminary result)   Collection Time: 02/12/2019  1:35 PM  Result Value Ref Range Status   Specimen Description BLOOD RIGHT HAND  Final   Special Requests   Final    BOTTLES DRAWN AEROBIC ONLY Blood Culture results may not be optimal due to an inadequate volume of blood received in culture bottles   Culture   Final    NO GROWTH 2 DAYS Performed at [facility], [place] [address], [place], [place] [address]    Report Status PENDING  Incomplete  SARS Coronavirus 2 (CEPHEID- Performed in [facility] lab), Hosp Order     Status: Abnormal   Collection Time: 02/26/2019  1:40 PM  Result Value Ref Range Status   SARS Coronavirus 2 POSITIVE (A)  NEGATIVE Final    Comment: RESULT CALLED TO, READ BACK BY AND VERIFIED WITH: M. Coffey RN 14:50 02/27/2019 ([email or web address]) (NOTE) If result is NEGATIVE SARS-CoV-2 target nucleic acids are NOT DETECTED. The SARS-CoV-2 RNA is generally detectable in upper and lower  respiratory specimens during the acute phase of infection. The lowest  concentration of SARS-CoV-2 viral copies this assay can detect is 250  copies / mL. A negative result does not preclude SARS-CoV-2 infection  and should not be used as the sole basis for treatment or other  patient management decisions.  A negative result may occur with  improper specimen collection / handling, submission of specimen other  than nasopharyngeal swab, presence of viral mutation(s) within the  areas targeted by this assay, and inadequate number of viral copies  (<250 copies / mL). A negative result must be combined with clinical  observations, patient history, and epidemiological information. If result is POSITIVE SARS-CoV-2 target nucleic acids are DETECTED.  The SARS-CoV-2 RNA is generally detectable in upper and lower  respiratory specimens during the acute phase of infection.  Positive  results are indicative of active infection with SARS-CoV-2.  Clinical  correlation with patient history and other diagnostic information is  necessary to determine patient infection status.  Positive results do  not rule out bacterial infection or co-infection with other viruses. If result is PRESUMPTIVE POSTIVE SARS-CoV-2 nucleic acids MAY BE PRESENT.   A presumptive positive result was obtained on the submitted specimen  and confirmed on repeat testing.  While 2019 novel coronavirus  (SARS-CoV-2) nucleic acids may be present in the submitted sample  additional confirmatory testing may be necessary for epidemiological  and /  or clinical management purposes  to differentiate between  SARS-CoV-2 and other Sarbecovirus currently known to infect humans.  If  clinically indicated additional testing with an alternate test  methodology [phone]) i s advised. The SARS-CoV-2 RNA is generally  detectable in upper and lower respiratory specimens during the acute  phase of infection. The expected result is Negative. Fact Sheet for Patients:  StrictlyIdeas.no Fact Sheet for Healthcare Providers: BankingDealers.co.za This test is not yet approved or cleared by the [place] FDA and has been authorized for detection and/or diagnosis of SARS-CoV-2 by FDA under an Emergency Use Authorization (EUA).  This EUA will remain in effect (meaning this test can be used) for the duration of the COVID-19 declaration under Section 564(b)(1) of the Act, 21 U.S.C. section 360bbb-3(b)(1), unless the authorization is terminated or revoked sooner. Performed at [facility], [place] [address] [place], [place] [address]     Radiology Reports Dg Chest Port 1 View  Result Date: 02/21/2019 CLINICAL DATA:  Low O2 sats.  Shortness of breath. EXAM: PORTABLE CHEST 1 VIEW COMPARISON:  10/18/2018 FINDINGS: Patient is rotated to the right displacing cardiomediastinal anatomy into the medial right hemithorax. Interstitial markings are diffusely coarsened with chronic features. The cardio pericardial silhouette is enlarged. Permanent pacemaker noted. Bones are diffusely demineralized. IMPRESSION: Cardiomegaly with underlying diffuse chronic interstitial lung disease. Interstitial markings are more prominent today raising the question of interstitial edema although this may be related to positioning. No substantial pleural effusion. Electronically Signed   By: Misty Stanley M.D.   On: 02/24/2019 14:13   [place] Ekg Site Rite  Result Date: 02/25/2019 If Site Rite image not attached, placement could not be confirmed due to current cardiac rhythm.

## 2019-02-25 NOTE — Plan of Care (Signed)
NO luck finding bear-hugger to attempt to raise patient temp.  While hospital including ICU checked.  Will continue to monitor temp - currently asymtomatic.

## 2019-02-25 NOTE — Progress Notes (Signed)
Responded to PIV consult. Pt with limited veinous sites. Unsuccessful attempt with [place] x2. Not appropriate for midline. Recommended Central line based on current treatment plan.

## 2019-02-26 LAB — COMPREHENSIVE METABOLIC PANEL
ALT: 9 U/L (ref 0–44)
AST: 16 U/L (ref 15–41)
Albumin: 2 g/dL — ABNORMAL LOW (ref 3.5–5.0)
Alkaline Phosphatase: 90 U/L (ref 38–126)
Anion gap: 11 (ref 5–15)
BUN: 92 mg/dL — ABNORMAL HIGH (ref 8–23)
CO2: 18 mmol/L — ABNORMAL LOW (ref 22–32)
Calcium: 8.3 mg/dL — ABNORMAL LOW (ref 8.9–10.3)
Chloride: 113 mmol/L — ABNORMAL HIGH (ref 98–111)
Creatinine, Ser: 3.19 mg/dL — ABNORMAL HIGH (ref 0.44–1.00)
GFR calc Af Amer: 15 mL/min — ABNORMAL LOW (ref >60)
GFR calc non Af Amer: 13 mL/min — ABNORMAL LOW (ref >60)
Glucose, Bld: 180 mg/dL — ABNORMAL HIGH (ref 70–99)
Potassium: 3.9 mmol/L (ref 3.5–5.1)
Sodium: 142 mmol/L (ref 135–145)
Total Bilirubin: 1 mg/dL (ref 0.3–1.2)
Total Protein: 6 g/dL — ABNORMAL LOW (ref 6.5–8.1)

## 2019-02-26 LAB — CBC WITH DIFFERENTIAL/PLATELET
Abs Immature Granulocytes: 0.09 10*3/uL — ABNORMAL HIGH (ref 0.00–0.07)
Basophils Absolute: 0 10*3/uL (ref 0.0–0.1)
Basophils Relative: 0 %
Eosinophils Absolute: 0 10*3/uL (ref 0.0–0.5)
Eosinophils Relative: 0 %
HCT: 40.5 % (ref 36.0–46.0)
Hemoglobin: 12.4 g/dL (ref 12.0–15.0)
Immature Granulocytes: 1 %
Lymphocytes Relative: 4 %
Lymphs Abs: 0.3 10*3/uL — ABNORMAL LOW (ref 0.7–4.0)
MCH: 25.8 pg — ABNORMAL LOW (ref 26.0–34.0)
MCHC: 30.6 g/dL (ref 30.0–36.0)
MCV: 84.4 fL (ref 80.0–100.0)
Monocytes Absolute: 0.3 10*3/uL (ref 0.1–1.0)
Monocytes Relative: 5 %
Neutro Abs: 5.9 10*3/uL (ref 1.7–7.7)
Neutrophils Relative %: 90 %
Platelets: 192 10*3/uL (ref 150–400)
RBC: 4.8 MIL/uL (ref 3.87–5.11)
RDW: 16.8 % — ABNORMAL HIGH (ref 11.5–15.5)
WBC: 6.6 10*3/uL (ref 4.0–10.5)
nRBC: 0.3 % — ABNORMAL HIGH (ref 0.0–0.2)

## 2019-02-26 LAB — GLUCOSE, CAPILLARY
Glucose-Capillary: 188 mg/dL — ABNORMAL HIGH (ref 70–99)
Glucose-Capillary: 178 mg/dL — ABNORMAL HIGH (ref 70–99)
Glucose-Capillary: 217 mg/dL — ABNORMAL HIGH (ref 70–99)
Glucose-Capillary: 198 mg/dL — ABNORMAL HIGH (ref 70–99)

## 2019-02-26 LAB — PROTIME-INR
INR: 3.3 — ABNORMAL HIGH (ref 0.8–1.2)
Prothrombin Time: 33.4 seconds — ABNORMAL HIGH (ref 11.4–15.2)

## 2019-02-26 LAB — D-DIMER, QUANTITATIVE: D-Dimer, Quant: 7.75 ug/mL-FEU — ABNORMAL HIGH (ref 0.00–0.50)

## 2019-02-26 LAB — CORTISOL: Cortisol, Plasma: 8.5 ug/dL

## 2019-02-26 LAB — FERRITIN: Ferritin: 255 ng/mL (ref 11–307)

## 2019-02-26 LAB — C-REACTIVE PROTEIN: CRP: 8.3 mg/dL — ABNORMAL HIGH (ref <1.0)

## 2019-02-26 LAB — BRAIN NATRIURETIC PEPTIDE: B Natriuretic Peptide: 150.7 pg/mL — ABNORMAL HIGH (ref 0.0–100.0)

## 2019-02-26 MED ORDER — TAMSULOSIN HCL 0.4 MG PO CAPS
0.4000 mg | ORAL_CAPSULE | Freq: Every day | ORAL | Status: DC
  Administered 2019-02-26: 10:00:00 0.4 mg via ORAL
  Filled 2019-02-26: qty 1

## 2019-02-26 MED ORDER — LACTATED RINGERS IV SOLN
INTRAVENOUS | Status: DC
  Administered 2019-02-26 – 2019-02-27 (×2): via INTRAVENOUS

## 2019-02-26 NOTE — Progress Notes (Signed)
ANTICOAGULATION CONSULT NOTE - Follow Up Consult  Pharmacy Consult for Warfarin/cefepime Indication: atrial fibrillation  Allergies  Allergen Reactions  . Amiodarone Shortness Of Breath and Nausea Only    "Toxicity," also  . Flecainide Shortness Of Breath and Other (See Comments)    "Toxicity," also   . Morphine And Related Nausea And Vomiting and Other (See Comments)    Family requests no morphine due to past reaction. Pt stated it made her sick, severe nausea and vomiting  . Bactrim Ds [Sulfamethoxazole-Trimethoprim] Diarrhea  . Cephalexin Nausea And Vomiting  . Pineapple Itching    Severe itching   Labs: Recent Labs    02/24/19 0425 02/25/19 0400 02/26/19 0230  HGB 14.1 13.9 12.4  HCT 46.1* 46.2* 40.5  PLT 197 195 192  LABPROT 44.0* 28.6* 33.4*  INR 4.8* 2.7* 3.3*  CREATININE 3.13* 2.78* 3.19*    Estimated Creatinine Clearance: 15.8 mL/min (A) (by C-G formula based on SCr of 3.19 mg/dL (H)).   Medical History: Past Medical History:  Diagnosis Date  . Acute gastric ulcer without mention of hemorrhage, perforation, or obstruction 2006   EGD  . Anxiety   . Atrial fibrillation (HCC)    flecainide; coumadin  . Breast cancer ([place])    left  . Carotid artery disease ([place])    16/10: RICA 9-60%, LICA 45-40%  //  [place] 98/11: RICA 9-14%; LICA 78-29% >> FU 1 year   . CHF (congestive heart failure) ([place])   . Chronic diastolic heart failure (HCC)    a. echo 10/10: mild LVH, EF 55-60%, mild AI, mild MR, mild to mod LAE, mild RVE, severe RAE, mod TR, PASP 53  /  b.  Echo 3/14:  Mild LVH, EF 55%, Tr AI, MAC, mild MR, mod LAE, PASP 31, trivial eff. // c. echo 1/17: EF 55-60%, mild AI, MAC, mild MR, moderate BAE, moderate TR  . Chronic lower back pain   . Dementia without behavioral disturbance ([place]) 11/24/2018  . Diabetic peripheral neuropathy ([place]) 03/09/2015  . Diverticulosis   . Esophagitis, unspecified 2006   EGD  . Hiatal hernia 2006   EGD  . History of nuclear  stress test    a. Myoview 2/12: EF 69%, no scar or ischemia  . Hx of adenomatous colonic polyps   . Hypertension   . Mediastinal lymphadenopathy   . Memory difficulty 11/14/2016  . Mitral regurgitation    mild, echo, October, 2010  . Osteopenia   . Presence of permanent cardiac pacemaker   . Pulmonary embolus [facility]) 2006   2006, with DVT  . Pulmonary HTN ([place])    53 mmHg echo, 2010, moderate TR, mild right ventricular enlargement  . Tachy-brady syndrome ([place])    s/p pacer 07/2009  . Thyroid nodule    non-neoplastic goiter  . Type II diabetes mellitus ([place])   . Venous insufficiency    Chronic    Medications:  Scheduled:  . bethanechol  25 mg Oral BID  . diltiazem  120 mg Oral Daily  . famotidine  20 mg Oral Daily  . feeding supplement (PRO-STAT SUGAR FREE 64)  30 mL Oral TID WC  . insulin aspart  0-15 Units Subcutaneous Q6H  . mouth rinse  15 mL Mouth Rinse BID  . memantine  5 mg Oral BID  . methylPREDNISolone (SOLU-MEDROL) injection  20 mg Intravenous Daily  . metoprolol succinate  50 mg Oral Daily  . QUEtiapine  25 mg Oral Daily  . tamsulosin  0.4 mg  Oral Daily  . vitamin C  500 mg Oral Daily  . Warfarin - Pharmacist Dosing Inpatient   Does not apply q1800  . zinc sulfate  220 mg Oral Daily   Infusions:  . ceFEPime (MAXIPIME) IV Stopped (02/25/19 8527)    Assessment: Pt is a 33 yoF on warfarin PTA for atrial fibrillation. PTA dose variable (1-2 mg daily). Most recently, she was taking 2 mg daily, last administered 5/24. Per nursing [facility], she was started on Levaquin 5/16, last dose yesterday 5/24. INR 2.8 on admission   INR up again today 3.3. It may trend up again. We will hold coumadin today.   Her scr cont to be elevated at 3.3 so we will keep cefepime at current dose. D4 abx now. Cultures remained neg.   Goal of Therapy:  INR 2-3 Monitor platelets by anticoagulation protocol: Yes   Plan:  No coumadin today Cont cefepime 2g IV q24 Daily INR and CBC. Monitor  for signs/symptoms of bleeding   Onnie Boer, PharmD, BCIDP, AAHIVP, CPP Infectious Disease Pharmacist 02/26/2019 10:42 AM

## 2019-02-26 NOTE — TOC Initial Note (Addendum)
Transition of Care [facility]) - Initial/Assessment Note    Patient Details  Name: [name] [name] MRN: [id number] Date of Birth: 14-Aug-1931  Transition of Care [facility] CM/SW Contact:    Alberteen Sam, [place] Phone Number: [phone] 02/26/2019, 2:57 PM  Clinical Narrative:                  CSW consulted with patient's daughter [name] to inform of CSW role, introduce self and initiate discharge planning. [name] confirms patient is from [facility] and has been there since January of this year. Ruzie in agreement for patient to return to [facility] when medically stable to discharge. [name] expresses no concerns or questions at this time. CSW will continue to follow for support and discharge planning needs.   ** COVID test (negatvie or positive) not required for patient to return to [place].  Expected Discharge Plan: Skilled Nursing Facility Barriers to Discharge: Continued Medical Work up   Patient Goals and CMS Choice   CMS Medicare.gov Compare Post Acute Care list provided to:: Patient Represenative (must comment)(Ruzie (daughter)) Choice offered to / list presented to : Adult Children(Ruzie (daughter))  Expected Discharge Plan and Services Expected Discharge Plan: [place] Acute Care Choice: [place] Living arrangements for the past 2 months: [place]                                      Prior Living Arrangements/Services Living arrangements for the past 2 months: [place] Lives with:: Self Patient language and need for interpreter reviewed:: Yes Do you feel safe going back to the place where you live?: Yes      Need for Family Participation in Patient Care: Yes (Comment) Care giver support system in place?: Yes (comment)   Criminal Activity/Legal Involvement Pertinent to Current Situation/Hospitalization: No - Comment as needed  Activities of Daily Living Home Assistive Devices/Equipment:  Wheelchair ADL Screening (condition at time of admission) Patient's cognitive ability adequate to safely complete daily activities?: No Is the patient deaf or have difficulty hearing?: No Does the patient have difficulty seeing, even when wearing glasses/contacts?: No Does the patient have difficulty concentrating, remembering, or making decisions?: Yes Patient able to express need for assistance with ADLs?: No Does the patient have difficulty dressing or bathing?: Yes Independently performs ADLs?: No Communication: Independent Dressing (OT): Needs assistance Is this a change from baseline?: Pre-admission baseline Grooming: Needs assistance Is this a change from baseline?: Pre-admission baseline Feeding: Independent Bathing: Needs assistance Is this a change from baseline?: Pre-admission baseline Toileting: Needs assistance Is this a change from baseline?: Pre-admission baseline In/Out Bed: Needs assistance Is this a change from baseline?: Pre-admission baseline Walks in Home: Dependent Is this a change from baseline?: Pre-admission baseline Does the patient have difficulty walking or climbing stairs?: Yes Weakness of Legs: Both Weakness of Arms/Hands: Both  Permission Sought/Granted Permission sought to share information with : Case Manager, Customer service manager, Family Supports Permission granted to share information with : Yes, Verbal Permission Granted  Share Information with NAME: [name]  Permission granted to share info w AGENCY: SNFs  Permission granted to share info w Relationship: daughter  Permission granted to share info w Contact Information: [phone]  Emotional Assessment Appearance:: Other (Comment Required(unable to assess - remote) Attitude/Demeanor/Rapport: Unable to Assess Affect (typically observed): Unable to Assess Orientation: : (disoriented X4) Alcohol / Substance Use:  Not Applicable Psych Involvement: No (comment)  Admission diagnosis:   Dehydration [E86.0] Hypoxia [R09.02] Generalized weakness [R53.1] AKI (acute kidney injury) ([place]) [N17.9] COVID-19 [U07.1, J98.8] Patient Active Problem List   Diagnosis Date Noted  . Acute respiratory failure with hypoxia ([place]) 02/14/2019  . Hypotension 02/12/2019  . Hypothermia 02/17/2019  . Acute kidney injury superimposed on chronic kidney disease ([place]) 02/04/2019  . COVID-19 virus infection 02/24/2019  . Dementia without behavioral disturbance ([place]) 11/24/2018  . FTT (failure to thrive) in adult 11/24/2018  . AKI (acute kidney injury) ([place]) 11/23/2018  . Pressure injury of skin 10/24/2018  . Palliative care by specialist   . Fall at home, initial encounter 10/18/2018  . Pulmonary embolism on long-term anticoagulation therapy ([place]) 10/18/2018  . Syncope and collapse 10/18/2018  . Weakness 09/03/2018  . Pruritus 05/08/2018  . Morbid obesity due to excess calories ([place]) complicated by hbp 24/82/5003  . Solitary pulmonary nodule on lung CT 03/21/2017  . Amiodarone toxicity 03/21/2017  . Pacemaker complications 70/48/8891  . Recurrent falls 01/13/2017  . Cognitive change   . A-fib ([place]) 10/14/2016  . Dysuria 10/10/2016  . Chronic back pain 07/28/2016  . Diabetes mellitus type 2, noninsulin dependent ([place])   . CKD (chronic kidney disease), stage III ([place]) 04/12/2016  . Chronic anticoagulation-Coumadin 04/12/2016  . Dyspnea on exertion 04/12/2016  . Hypertensive heart disease 03/28/2016  . Anemia, iron deficiency 10/22/2015  . Diabetic peripheral neuropathy ([place]) 03/09/2015  . Chronic diastolic (congestive) heart failure ([place]) 09/07/2014  . Sick sinus syndrome ([place]) 03/09/2014  . Goals of care, counseling/discussion 11/04/2013  . Hypertension 06/21/2013  . Osteopenia   . Pulmonary HTN ([place])   . History of pulmonary embolism   . Carotid artery disease ([place])   . History of breast cancer   . Pacemaker-Medtronic   . Syncope   . Mitral regurgitation   . Mediastinal  lymphadenopathy    PCP:  Hoyt Koch, MD Pharmacy:  No Pharmacies Listed    Social Determinants of Health (SDOH) Interventions    Readmission Risk Interventions Readmission Risk Prevention Plan 02/24/2019  Transportation Screening Complete  Medication Review ([place]) Complete  PCP or Specialist appointment within 3-5 days of discharge (No Data)  [place] or [place] Not Complete  HRI or Home Care Consult Pt Refusal Comments From facility - will discharge back to facility  SW Recovery Care/Counseling Consult Complete  Palliative Care Screening Not Complete  Comments Attending having ongoing discussions with daughter   Some recent data might be hidden

## 2019-02-26 NOTE — Progress Notes (Signed)
SLP Cancellation Note  Patient Details Name: Berklie Dethlefs MRN: [id number] DOB: 03/05/1931   Cancelled Treatment:  Pt more lethargic today; unable to participate.  SLP will follow.     Juan Quam Laurice 02/26/2019, 5:01 PM

## 2019-02-26 NOTE — Progress Notes (Signed)
PROGRESS NOTE                                                                                                                                                                                                             Patient Demographics:    [name] [name], is a 85 y.o. female, DOB - 1931-09-08, LKJ:[id number]  Outpatient Primary MD for the patient is Hoyt Koch, MD    LOS - 4  Admit date - 02/04/2019    Chief Complaint  Patient presents with   low oxygen   lethargic       Brief Narrative    [name] [name] is a 85 y.o. female with medical history significant of severe underlying dementia, A. fib, DVT/PE on Coumadin, tachy-brady  syndrome s/p PPM, breast cancer s/p mastectomy, chronic diastolic heart failure, DM type II, PAH, CKD stage IV, and frequent UTIs; who presents with reports of low oxygenation from [facility], she was diagnosed with COVID-19 pneumonitis and admitted to the [facility] hospital.   Subjective:   Patient in bed, appears more lethargic and frail, today unable to answer questions or cooperate in exam.   Assessment  & Plan :     1. Acute Hypoxic Resp. Failure due to Acute Covid 19 Viral Illness during the ongoing 2020 Covid 19 Pandemic -  CXR suggests pneumonitis with mild hypoxia requiring 1 to 2 L oxygen, inflammatory markers were borderline, placed on IV Solu-Medrol and Actemra was given on 02/24/2019, her oxygenation is stable and she is currently between 1 to 2 L nasal cannula oxygen.  I-S flutter valve if she can use and tolerate for pulmonary toiletry.     Overall extremely frail and due to underlying dementia and now due to toxic encephalopathy unable to take proper diet or participate in pulmonary toiletry which makes her condition more tenuous.  Need to monitor closely.  If she declines further family okay for comfort measures, discussed plan again with daughter on 02/26/2019 in  detail.   ABG     Component Value Date/Time   PHART 7.497 (H) 10/18/2018 1417   PCO2ART 28.3 (L) 10/18/2018 1417   PO2ART 62.0 (L) 10/18/2018 1417   HCO3 21.9 10/18/2018 1417   TCO2 23 10/18/2018 1417   O2SAT 94.0 10/18/2018 1417    COVID-19 Labs  Recent Labs    02/24/19  0425 02/24/19 0430 02/25/19 0400 02/26/19 0230  DDIMER 1.98*  --  3.92* 7.75*  FERRITIN  --  276 249 255  CRP  --  18.2* 13.6* 8.3*    Lab Results  Component Value Date   SARSCOV2NAA POSITIVE (A) 02/08/2019        Component Value Date/Time   BNP 150.7 (H) 02/26/2019 0500   BNP 607.5 (H) 09/27/2015 1500      2.  Acute on chronic combined systolic and diastolic CHF, recent echo 45% - clinically dehydrated continue to monitor.  Currently on diltiazem which will be continued.  3.  Chronic dementia with delirium and toxic encephalopathy.  On Namenda which will be continued, still getting quite agitated at nighttime and aggressive towards the staff, low-dose Haldol given on 02/23/2019 night seems to have made her very somnolent, will try nighttime Seroquel instead, soft mittens for patient and staff protection.  Avoid benzodiazepines and narcotics.  4.  History of DVT PE.  On Coumadin status post 1 dose of IV vitamin K on 02/24/2019.  INR currently stable.  5.  Chronic atrial fibrillation [place] vas 2 score of at least 4.  On diltiazem Coumadin.  Blood pressure too low stopped beta-blocker.  6.  ARF on CKD stage IV.  Baseline creatinine 2.3.  Renal function declining and appears dehydrated, hydrate with LR on 02/26/2019 and monitor.   7.  GERD.  Placed on PPI.  8.  UTI.  Next treatment with Maxipime, 1 out of 2 blood cultures had coag negative staph contamination.    9.  Mild hypothermia, she is on Maxipime, blood cultures overall clinically negative except for 1 out of 2 contamination with coag negative staph.  Her random TSH and cortisol are stable, continue supportive care with bear hugger and  increasing room temperature.    10.  Urinary retention.  Developed 02/25/2019.  Foley and Flomax and monitor.   11.  DM type II.  Currently in poor control due to hypoglycemia.  Currently on sliding scale and monitor.  Lab Results  Component Value Date   HGBA1C 6.3 (H) 10/14/2016   CBG (last 3)  Recent Labs    02/25/19 2241 02/26/19 0336 02/26/19 1017  GLUCAP 179* 188* 178*     Condition -  Guarded  Family Communication  :  Daughter understands the risks and benefits of using Actemra, accepts it and would like to use it if needed, DNR, overall prognosis remains guarded daughter updated again on 02/24/2019.  If declines full comfort.  Again on 02/26/2019.  Code Status :  DNR  Diet : Soft  Disposition Plan  :  SNF  Consults  :  None  Procedures  :  None  PUD Prophylaxis : PPI  DVT Prophylaxis  :  Coumadin  Lab Results  Component Value Date   INR 3.3 (H) 02/26/2019   INR 2.7 (H) 02/25/2019   INR 4.8 (HH) 02/24/2019     Lab Results  Component Value Date   PLT 192 02/26/2019    Inpatient Medications  Scheduled Meds:  bethanechol  25 mg Oral BID   diltiazem  120 mg Oral Daily   famotidine  20 mg Oral Daily   feeding supplement (PRO-STAT SUGAR FREE 64)  30 mL Oral TID WC   insulin aspart  0-15 Units Subcutaneous Q6H   mouth rinse  15 mL Mouth Rinse BID   memantine  5 mg Oral BID   methylPREDNISolone (SOLU-MEDROL) injection  20 mg Intravenous Daily  metoprolol succinate  50 mg Oral Daily   QUEtiapine  25 mg Oral Daily   tamsulosin  0.4 mg Oral Daily   vitamin C  500 mg Oral Daily   Warfarin - Pharmacist Dosing Inpatient   Does not apply q1800   zinc sulfate  220 mg Oral Daily   Continuous Infusions:  lactated ringers     PRN Meds:.albuterol, guaiFENesin-dextromethorphan, [DISCONTINUED] ondansetron **OR** ondansetron (ZOFRAN) IV  Antibiotics  :    Anti-infectives (From admission, onward)   Start     Dose/Rate Route Frequency Ordered  Stop   02/24/19 2000  vancomycin (VANCOCIN) IVPB 750 mg/150 ml premix  Status:  Discontinued     750 mg 150 mL/hr over 60 Minutes Intravenous Every 48 hours 02/17/2019 1930 02/19/2019 2322   02/23/19 2000  ceFEPIme (MAXIPIME) 2 g in sodium chloride 0.9 % 100 mL IVPB  Status:  Discontinued     2 g 200 mL/hr over 30 Minutes Intravenous Every 24 hours 02/17/2019 1930 02/26/19 1101   02/09/2019 2030  metroNIDAZOLE (FLAGYL) IVPB 500 mg  Status:  Discontinued     500 mg 100 mL/hr over 60 Minutes Intravenous Every 8 hours 02/16/2019 1912 02/23/19 0932   02/13/2019 2015  ceFEPIme (MAXIPIME) 2 g in sodium chloride 0.9 % 100 mL IVPB     2 g 200 mL/hr over 30 Minutes Intravenous  Once 02/06/2019 1912 02/18/2019 2132   02/17/2019 1930  vancomycin (VANCOCIN) 1,750 mg in sodium chloride 0.9 % 500 mL IVPB  Status:  Discontinued     1,750 mg 250 mL/hr over 120 Minutes Intravenous  Once 02/11/2019 1929 02/19/2019 2322   02/23/2019 1915  vancomycin (VANCOCIN) IVPB 1000 mg/200 mL premix  Status:  Discontinued     1,000 mg 200 mL/hr over 60 Minutes Intravenous  Once 02/26/2019 1912 02/25/2019 1929       Time Spent in minutes  30   Lala Lund M.D on 02/26/2019 at 11:04 AM  To page go to [email or web address] - password [facility]  [facility] -  Office  [phone]   See all Orders from today for further details    Objective:   Vitals:   02/26/19 0500 02/26/19 0600 02/26/19 0956 02/26/19 1039  BP:   108/64 118/69  Pulse: 92 75 (!) 105 (!) 41  Resp: 18 19  (!) 21  Temp:      TempSrc:      SpO2: 100% 100%  100%  Weight:        Wt Readings from Last 3 Encounters:  02/16/2019 88 kg  11/25/18 100.4 kg  10/27/18 102.7 kg     Intake/Output Summary (Last 24 hours) at 02/26/2019 1104 Last data filed at 02/26/2019 0500 Gross per 24 hour  Intake 250.37 ml  Output 970 ml  Net -719.63 ml     Physical Exam  Somnolent but actually slightly improved since 02/24/2019, still appears quite frail and weak, [place].AT,PERRAL Supple  Neck,No JVD, No cervical lymphadenopathy appriciated.  Symmetrical Chest wall movement, Good air movement bilaterally, CTAB RRR,No Gallops, Rubs or new Murmurs, No Parasternal Heave +ve B.Sounds, Abd Soft, No tenderness, No organomegaly appriciated, No rebound - guarding or rigidity. No Cyanosis, Clubbing or edema, No new Rash or bruise    Data Review:    CBC Recent Labs  Lab 02/13/2019 1322 02/23/19 0538 02/24/19 0425 02/25/19 0400 02/26/19 0230  WBC 4.7 4.8 3.9* 3.7* 6.6  HGB 14.4 13.2 14.1 13.9 12.4  HCT 47.0* 43.8 46.1* 46.2* 40.5  PLT 198  190 197 195 192  MCV 85.6 87.3 86.5 85.7 84.4  MCH 26.2 26.3 26.5 25.8* 25.8*  MCHC 30.6 30.1 30.6 30.1 30.6  RDW 16.4* 16.9* 17.0* 17.2* 16.8*  LYMPHSABS 0.4* 0.4* 0.2* 0.3* 0.3*  MONOABS 0.2 0.1 0.1 0.1 0.3  EOSABS 0.0 0.0 0.0 0.0 0.0  BASOSABS 0.0 0.0 0.0 0.0 0.0    Chemistries  Recent Labs  Lab 02/21/2019 1322 02/23/19 0538 02/24/19 0425 02/25/19 0400 02/26/19 0230  NA 141 143 142 142 142  K 4.4 4.2 4.1 4.3 3.9  CL 106 112* 112* 113* 113*  CO2 20* 19* 13* 15* 18*  GLUCOSE 97 49* 232* 274* 180*  BUN 79* 76* 72* 75* 92*  CREATININE 3.93* 3.61* 3.13* 2.78* 3.19*  CALCIUM 8.4* 8.0* 8.3* 8.8* 8.3*  AST 37 28 31 18 16   ALT 11 11 6 10 9   ALKPHOS 125 104 108 105 90  BILITOT 1.2 0.8 0.9 1.2 1.0   ------------------------------------------------------------------------------------------------------------------ No results for input(s): CHOL, HDL, LDLCALC, TRIG, CHOLHDL, LDLDIRECT in the last 72 hours.  Lab Results  Component Value Date   HGBA1C 6.3 (H) 10/14/2016   ------------------------------------------------------------------------------------------------------------------ Recent Labs    02/25/19 1216  TSH 1.468    Cardiac Enzymes No results for input(s): CKMB, TROPONINI, MYOGLOBIN in the last 168 hours.  Invalid input(s):  CK ------------------------------------------------------------------------------------------------------------------    Component Value Date/Time   BNP 150.7 (H) 02/26/2019 0500   BNP 607.5 (H) 09/27/2015 1500    Micro Results Recent Results (from the past 240 hour(s))  Blood Culture (routine x 2)     Status: Abnormal   Collection Time: 02/10/2019  1:20 PM  Result Value Ref Range Status   Specimen Description BLOOD RIGHT ANTECUBITAL  Final   Special Requests   Final    BOTTLES DRAWN AEROBIC AND ANAEROBIC Blood Culture results may not be optimal due to an inadequate volume of blood received in culture bottles   Culture  Setup Time   Final    GRAM POSITIVE COCCI AEROBIC BOTTLE ONLY CRITICAL RESULT CALLED TO, READ BACK BY AND VERIFIED WITH: Asencion Islam PharmD 10:05 02/24/19 ([email or web address])    Culture (A)  Final    STAPHYLOCOCCUS SPECIES (COAGULASE NEGATIVE) THE SIGNIFICANCE OF ISOLATING THIS ORGANISM FROM A SINGLE SET OF BLOOD CULTURES WHEN MULTIPLE SETS ARE DRAWN IS UNCERTAIN. PLEASE NOTIFY THE MICROBIOLOGY DEPARTMENT WITHIN ONE WEEK IF SPECIATION AND SENSITIVITIES ARE REQUIRED. Performed at [facility], [place] [address], [place], [place] [address]    Report Status 02/26/2019 FINAL  Final  Blood Culture ID Panel (Reflexed)     Status: Abnormal   Collection Time: 02/01/2019  1:20 PM  Result Value Ref Range Status   Enterococcus species NOT DETECTED NOT DETECTED Final   Listeria monocytogenes NOT DETECTED NOT DETECTED Final   Staphylococcus species DETECTED (A) NOT DETECTED Final    Comment: Methicillin (oxacillin) susceptible coagulase negative staphylococcus. Possible blood culture contaminant (unless isolated from more than one blood culture draw or clinical case suggests pathogenicity). No antibiotic treatment is indicated for blood  culture contaminants. CRITICAL RESULT CALLED TO, READ BACK BY AND VERIFIED WITH: Asencion Islam PharmD 10:05 02/24/19 ([email or web address])    Staphylococcus aureus  (BCID) NOT DETECTED NOT DETECTED Final   Methicillin resistance NOT DETECTED NOT DETECTED Final   Streptococcus species NOT DETECTED NOT DETECTED Final   Streptococcus agalactiae NOT DETECTED NOT DETECTED Final   Streptococcus pneumoniae NOT DETECTED NOT DETECTED Final   Streptococcus pyogenes NOT DETECTED NOT DETECTED Final  Acinetobacter baumannii NOT DETECTED NOT DETECTED Final   Enterobacteriaceae species NOT DETECTED NOT DETECTED Final   Enterobacter cloacae complex NOT DETECTED NOT DETECTED Final   Escherichia coli NOT DETECTED NOT DETECTED Final   Klebsiella oxytoca NOT DETECTED NOT DETECTED Final   Klebsiella pneumoniae NOT DETECTED NOT DETECTED Final   Proteus species NOT DETECTED NOT DETECTED Final   Serratia marcescens NOT DETECTED NOT DETECTED Final   Haemophilus influenzae NOT DETECTED NOT DETECTED Final   Neisseria meningitidis NOT DETECTED NOT DETECTED Final   Pseudomonas aeruginosa NOT DETECTED NOT DETECTED Final   Candida albicans NOT DETECTED NOT DETECTED Final   Candida glabrata NOT DETECTED NOT DETECTED Final   Candida krusei NOT DETECTED NOT DETECTED Final   Candida parapsilosis NOT DETECTED NOT DETECTED Final   Candida tropicalis NOT DETECTED NOT DETECTED Final    Comment: Performed at [facility], [place] [address] [place], [place] [address]  Blood Culture (routine x 2)     Status: None (Preliminary result)   Collection Time: 02/10/2019  1:35 PM  Result Value Ref Range Status   Specimen Description BLOOD RIGHT HAND  Final   Special Requests   Final    BOTTLES DRAWN AEROBIC ONLY Blood Culture results may not be optimal due to an inadequate volume of blood received in culture bottles   Culture  Setup Time AEROBIC BOTTLE ONLY GRAM POSITIVE COCCI   Final   Culture   Final    NO GROWTH 3 DAYS Performed at [facility], [place] [address] [place], [place] [address]    Report Status PENDING  Incomplete  SARS Coronavirus 2 (CEPHEID- Performed in [place]  hospital lab), Hosp Order     Status: Abnormal   Collection Time: 02/09/2019  1:40 PM  Result Value Ref Range Status   SARS Coronavirus 2 POSITIVE (A) NEGATIVE Final    Comment: RESULT CALLED TO, READ BACK BY AND VERIFIED WITH: M. Coffey RN 14:50 02/03/2019 ([email or web address]) (NOTE) If result is NEGATIVE SARS-CoV-2 target nucleic acids are NOT DETECTED. The SARS-CoV-2 RNA is generally detectable in upper and lower  respiratory specimens during the acute phase of infection. The lowest  concentration of SARS-CoV-2 viral copies this assay can detect is 250  copies / mL. A negative result does not preclude SARS-CoV-2 infection  and should not be used as the sole basis for treatment or other  patient management decisions.  A negative result may occur with  improper specimen collection / handling, submission of specimen other  than nasopharyngeal swab, presence of viral mutation(s) within the  areas targeted by this assay, and inadequate number of viral copies  (<250 copies / mL). A negative result must be combined with clinical  observations, patient history, and epidemiological information. If result is POSITIVE SARS-CoV-2 target nucleic acids are DETECTED.  The SARS-CoV-2 RNA is generally detectable in upper and lower  respiratory specimens during the acute phase of infection.  Positive  results are indicative of active infection with SARS-CoV-2.  Clinical  correlation with patient history and other diagnostic information is  necessary to determine patient infection status.  Positive results do  not rule out bacterial infection or co-infection with other viruses. If result is PRESUMPTIVE POSTIVE SARS-CoV-2 nucleic acids MAY BE PRESENT.   A presumptive positive result was obtained on the submitted specimen  and confirmed on repeat testing.  While 2019 novel coronavirus  (SARS-CoV-2) nucleic acids may be present in the submitted sample  additional confirmatory testing may be  necessary for  epidemiological  and / or clinical management purposes  to differentiate between  SARS-CoV-2 and other Sarbecovirus currently known to infect humans.  If clinically indicated additional testing with an alternate test  methodology [phone]) i s advised. The SARS-CoV-2 RNA is generally  detectable in upper and lower respiratory specimens during the acute  phase of infection. The expected result is Negative. Fact Sheet for Patients:  StrictlyIdeas.no Fact Sheet for Healthcare Providers: BankingDealers.co.za This test is not yet approved or cleared by the [place] FDA and has been authorized for detection and/or diagnosis of SARS-CoV-2 by FDA under an Emergency Use Authorization (EUA).  This EUA will remain in effect (meaning this test can be used) for the duration of the COVID-19 declaration under Section 564(b)(1) of the Act, 21 U.S.C. section 360bbb-3(b)(1), unless the authorization is terminated or revoked sooner. Performed at [facility], [place] [address], [place], [place] [address]     Radiology Reports Dg Chest Port 1 View  Result Date: 02/25/2019 CLINICAL DATA:  PICC line placement EXAM: PORTABLE CHEST 1 VIEW COMPARISON:  02/25/2019 FINDINGS: Placement of PICC line with tip in distal SVC. Normal cardiac silhouette. Probable hiatal hernia. Fine interstitial lung pattern. No pneumothorax IMPRESSION: PICC line in appropriate position. Interstitial edema / pulmonary edema pattern. Electronically Signed   By: Suzy Bouchard M.D.   On: 02/25/2019 16:56   Dg Chest Port 1 View  Result Date: 02/25/2019 CLINICAL DATA:  Shortness of breath. EXAM: PORTABLE CHEST 1 VIEW COMPARISON:  02/04/2019.  10/18/2018.  05/20/2018. FINDINGS: Mediastinum and hilar structures normal. Cardiomegaly. No pulmonary venous congestion. Prominence of interstitial markings. A component these interstitial changes may be chronic however an active interstitial  process including pneumonitis and/or interstitial edema cannot be excluded. No pleural effusion or pneumothorax. Carotid vascular calcification. Sliding hiatal hernia. IMPRESSION: 1. Cardiac pacer with lead tips over the right atrium and right ventricle. Stable cardiomegaly. No pulmonary venous congestion. 2. Prominence of interstitial markings. A component these interstitial changes may be chronic however an active interstitial process including pneumonitis and/or interstitial edema cannot be excluded. No pleural effusion or pneumothorax. 3.  Carotid vascular disease. Electronically Signed   By: Marcello Moores  Register   On: 02/25/2019 12:57   Dg Chest Port 1 View  Result Date: 02/02/2019 CLINICAL DATA:  Low O2 sats.  Shortness of breath. EXAM: PORTABLE CHEST 1 VIEW COMPARISON:  10/18/2018 FINDINGS: Patient is rotated to the right displacing cardiomediastinal anatomy into the medial right hemithorax. Interstitial markings are diffusely coarsened with chronic features. The cardio pericardial silhouette is enlarged. Permanent pacemaker noted. Bones are diffusely demineralized. IMPRESSION: Cardiomegaly with underlying diffuse chronic interstitial lung disease. Interstitial markings are more prominent today raising the question of interstitial edema although this may be related to positioning. No substantial pleural effusion. Electronically Signed   By: Misty Stanley M.D.   On: 02/21/2019 14:13   [place] Ekg Site Rite  Result Date: 02/25/2019 If Site Rite image not attached, placement could not be confirmed due to current cardiac rhythm.

## 2019-02-26 NOTE — Progress Notes (Signed)
Called and spoke with patient's daughter, [name].  I gave update on her mother's condition and answered all questions.  Earleen Reaper RN

## 2019-02-26 NOTE — Progress Notes (Signed)
At this time, patient's mental status does not allow for safe administration of PO medications.  I Will continue to monitor patient.  She is very lethargic and does not follow commands.  [name] [name]

## 2019-02-27 ENCOUNTER — Encounter (HOSPITAL_COMMUNITY): Payer: Self-pay | Admitting: *Deleted

## 2019-02-27 LAB — COMPREHENSIVE METABOLIC PANEL
ALT: 10 U/L (ref 0–44)
AST: 15 U/L (ref 15–41)
Albumin: 2 g/dL — ABNORMAL LOW (ref 3.5–5.0)
Alkaline Phosphatase: 95 U/L (ref 38–126)
Anion gap: 9 (ref 5–15)
BUN: 94 mg/dL — ABNORMAL HIGH (ref 8–23)
CO2: 18 mmol/L — ABNORMAL LOW (ref 22–32)
Calcium: 7.8 mg/dL — ABNORMAL LOW (ref 8.9–10.3)
Chloride: 119 mmol/L — ABNORMAL HIGH (ref 98–111)
Creatinine, Ser: 3.04 mg/dL — ABNORMAL HIGH (ref 0.44–1.00)
GFR calc Af Amer: 15 mL/min — ABNORMAL LOW (ref >60)
GFR calc non Af Amer: 13 mL/min — ABNORMAL LOW (ref >60)
Glucose, Bld: 164 mg/dL — ABNORMAL HIGH (ref 70–99)
Potassium: 3.4 mmol/L — ABNORMAL LOW (ref 3.5–5.1)
Sodium: 146 mmol/L — ABNORMAL HIGH (ref 135–145)
Total Bilirubin: 1 mg/dL (ref 0.3–1.2)
Total Protein: 6.1 g/dL — ABNORMAL LOW (ref 6.5–8.1)

## 2019-02-27 LAB — CBC WITH DIFFERENTIAL/PLATELET
Abs Immature Granulocytes: 0.1 10*3/uL — ABNORMAL HIGH (ref 0.00–0.07)
Basophils Absolute: 0 10*3/uL (ref 0.0–0.1)
Basophils Relative: 0 %
Eosinophils Absolute: 0 10*3/uL (ref 0.0–0.5)
Eosinophils Relative: 0 %
HCT: 42.4 % (ref 36.0–46.0)
Hemoglobin: 13.3 g/dL (ref 12.0–15.0)
Immature Granulocytes: 1 %
Lymphocytes Relative: 3 %
Lymphs Abs: 0.3 10*3/uL — ABNORMAL LOW (ref 0.7–4.0)
MCH: 26.6 pg (ref 26.0–34.0)
MCHC: 31.4 g/dL (ref 30.0–36.0)
MCV: 84.8 fL (ref 80.0–100.0)
Monocytes Absolute: 0.2 10*3/uL (ref 0.1–1.0)
Monocytes Relative: 2 %
Neutro Abs: 8.7 10*3/uL — ABNORMAL HIGH (ref 1.7–7.7)
Neutrophils Relative %: 94 %
Platelets: 155 10*3/uL (ref 150–400)
RBC: 5 MIL/uL (ref 3.87–5.11)
RDW: 17.3 % — ABNORMAL HIGH (ref 11.5–15.5)
WBC: 9.3 10*3/uL (ref 4.0–10.5)
nRBC: 0.2 % (ref 0.0–0.2)

## 2019-02-27 LAB — GLUCOSE, CAPILLARY
Glucose-Capillary: 159 mg/dL — ABNORMAL HIGH (ref 70–99)
Glucose-Capillary: 132 mg/dL — ABNORMAL HIGH (ref 70–99)
Glucose-Capillary: 184 mg/dL — ABNORMAL HIGH (ref 70–99)
Glucose-Capillary: 193 mg/dL — ABNORMAL HIGH (ref 70–99)

## 2019-02-27 LAB — FERRITIN: Ferritin: 217 ng/mL (ref 11–307)

## 2019-02-27 LAB — C-REACTIVE PROTEIN: CRP: 5.8 mg/dL — ABNORMAL HIGH (ref <1.0)

## 2019-02-27 LAB — PROTIME-INR
INR: 4.1 (ref 0.8–1.2)
Prothrombin Time: 38.9 seconds — ABNORMAL HIGH (ref 11.4–15.2)

## 2019-02-27 LAB — D-DIMER, QUANTITATIVE: D-Dimer, Quant: 9.17 ug/mL-FEU — ABNORMAL HIGH (ref 0.00–0.50)

## 2019-02-27 MED ORDER — POTASSIUM CHLORIDE 2 MEQ/ML IV SOLN: INTRAVENOUS | Status: DC

## 2019-02-27 MED ORDER — POTASSIUM CHLORIDE 2 MEQ/ML IV SOLN
INTRAVENOUS | Status: DC
  Administered 2019-02-27 – 2019-02-28 (×3): via INTRAVENOUS
  Filled 2019-02-27 (×2): qty 1000

## 2019-02-27 MED ORDER — CEFAZOLIN SODIUM-DEXTROSE 2-4 GM/100ML-% IV SOLN
2.0000 g | Freq: Two times a day (BID) | INTRAVENOUS | Status: DC
  Administered 2019-02-27 – 2019-02-28 (×2): 2 g via INTRAVENOUS
  Filled 2019-02-27 (×2): qty 100

## 2019-02-27 NOTE — Progress Notes (Signed)
PROGRESS NOTE                                                                                                                                                                                                             Patient Demographics:    [name] [name], is a 85 y.o. female, DOB - 03-Sep-1931, LYY:[id number]  Outpatient Primary MD for the patient is Hoyt Koch, MD    LOS - 5  Admit date - 02/17/2019    Chief Complaint  Patient presents with  . low oxygen  . lethargic       Brief Narrative    [name] [name] is a 85 y.o. female with medical history significant of severe underlying dementia, A. fib, DVT/PE on Coumadin, tachy-brady  syndrome s/p PPM, breast cancer s/p mastectomy, chronic diastolic heart failure, DM type II, PAH, CKD stage IV, and frequent UTIs; who presents with reports of low oxygenation from [facility], she was diagnosed with COVID-19 pneumonitis and admitted to the [facility] hospital.   Subjective:   Patient in bed, appears more lethargic and frail, today unable to answer questions or cooperate in exam.   Assessment  & Plan :     1. Acute Hypoxic Resp. Failure due to Acute Covid 19 Viral Illness during the ongoing 2020 Covid 19 Pandemic -  CXR suggests pneumonitis with mild hypoxia requiring 1 to 2 L oxygen, inflammatory markers were borderline, placed on IV Solu-Medrol and Actemra was given on 02/24/2019, her oxygenation is stable and she is currently between 1 to 2 L nasal cannula oxygen.  I-S flutter valve if she can use and tolerate for pulmonary toiletry.     Overall extremely frail and due to underlying dementia and now due to toxic encephalopathy unable to take proper diet or participate in pulmonary toiletry which makes her condition more tenuous.  Need to monitor closely.  If she declines further family okay for comfort measures, discussed plan again with daughter on 02/26/2019 in  detail.   ABG     Component Value Date/Time   PHART 7.497 (H) 10/18/2018 1417   PCO2ART 28.3 (L) 10/18/2018 1417   PO2ART 62.0 (L) 10/18/2018 1417   HCO3 21.9 10/18/2018 1417   TCO2 23 10/18/2018 1417   O2SAT 94.0 10/18/2018 1417    COVID-19 Labs  Recent Labs    02/25/19  0400 02/26/19 0230 02/27/19 0250  DDIMER 3.92* 7.75* 9.17*  FERRITIN 249 255 217  CRP 13.6* 8.3* 5.8*    Lab Results  Component Value Date   SARSCOV2NAA POSITIVE (A) 02/27/2019        Component Value Date/Time   BNP 150.7 (H) 02/26/2019 0500   BNP 607.5 (H) 09/27/2015 1500      2.  Acute on chronic combined systolic and diastolic CHF, recent echo 45% - clinically dehydrated continue to monitor.  Unable to take oral medications hence oral diltiazem on hold, PRN IV diltiazem added for rate control of underlying A. fib if needed.  3.  Chronic dementia with delirium and toxic encephalopathy.  On Namenda which will be continued, still getting quite agitated at nighttime and aggressive towards the staff, low-dose Haldol given on 02/23/2019 night seems to have made her very somnolent, will try nighttime Seroquel instead, soft mittens for patient and staff protection.  Avoid benzodiazepines and narcotics.  4.  History of DVT PE.  On Coumadin status post 1 dose of IV vitamin K on 02/24/2019.  INR currently stable.  5.  Chronic atrial fibrillation [place] vas 2 score of at least 4.  Coumadin and PRN IV diltiazem, cannot take oral diltiazem right now due to somnolence.  Blood pressure too low stopped beta-blocker.  6.  ARF on CKD stage IV.  Baseline creatinine 2.3.  Renal function declining and appears dehydrated, hydrate with LR on 02/26/2019 and monitor.   7.  GERD.  Placed on PPI.  8.  UTI.  Next treatment with Maxipime, 1 out of 2 blood cultures had coag negative staph contamination.    9.  Mild hypothermia-  blood cultures overall clinically negative except for 1 out of 2 contamination with coag negative  staph.  Her random TSH and cortisol are stable, continue supportive care with bear hugger and increasing room temperature.  This problem currently improved.  10.  Urinary retention.  Developed 02/25/2019.  Foley and Flomax and monitor.   11.  Hypernatremia and hypokalemia.  D5W and replace potassium.  Monitor closely.   12.  DM type II.  Currently in poor control due to hypoglycemia.  Currently on sliding scale and monitor.  Lab Results  Component Value Date   HGBA1C 6.3 (H) 10/14/2016   CBG (last 3)  Recent Labs    02/26/19 2103 02/27/19 0256 02/27/19 0922  GLUCAP 198* 159* 132*     Condition -  Guarded  Family Communication  :  Daughter understands the risks and benefits of using Actemra, accepts it and would like to use it if needed, DNR, overall prognosis remains guarded daughter updated again on 02/24/2019.  If declines full comfort, confirmed again on 02/26/2019.  Code Status :  DNR  Diet : Soft  Disposition Plan  :  SNF  Consults  :  None  Procedures  :  None  PUD Prophylaxis : PPI  DVT Prophylaxis  :  Coumadin  Lab Results  Component Value Date   INR 4.1 (HH) 02/27/2019   INR 3.3 (H) 02/26/2019   INR 2.7 (H) 02/25/2019     Lab Results  Component Value Date   PLT 155 02/27/2019    Inpatient Medications  Scheduled Meds: . bethanechol  25 mg Oral BID  . famotidine  20 mg Oral Daily  . feeding supplement (PRO-STAT SUGAR FREE 64)  30 mL Oral TID WC  . insulin aspart  0-15 Units Subcutaneous Q6H  . mouth rinse  15 mL  Mouth Rinse BID  . memantine  5 mg Oral BID  . tamsulosin  0.4 mg Oral Daily  . vitamin C  500 mg Oral Daily  . Warfarin - Pharmacist Dosing Inpatient   Does not apply q1800  . zinc sulfate  220 mg Oral Daily   Continuous Infusions: . dextrose 5 % with kcl 50 mL/hr at 02/27/19 0927   PRN Meds:.albuterol, guaiFENesin-dextromethorphan, [DISCONTINUED] ondansetron **OR** ondansetron (ZOFRAN) IV  Antibiotics  :    Anti-infectives (From  admission, onward)   Start     Dose/Rate Route Frequency Ordered Stop   02/24/19 2000  vancomycin (VANCOCIN) IVPB 750 mg/150 ml premix  Status:  Discontinued     750 mg 150 mL/hr over 60 Minutes Intravenous Every 48 hours 02/06/2019 1930 02/11/2019 2322   02/23/19 2000  ceFEPIme (MAXIPIME) 2 g in sodium chloride 0.9 % 100 mL IVPB  Status:  Discontinued     2 g 200 mL/hr over 30 Minutes Intravenous Every 24 hours 02/21/2019 1930 02/26/19 1101   01/29/2019 2030  metroNIDAZOLE (FLAGYL) IVPB 500 mg  Status:  Discontinued     500 mg 100 mL/hr over 60 Minutes Intravenous Every 8 hours 01/31/2019 1912 02/23/19 0932   02/07/2019 2015  ceFEPIme (MAXIPIME) 2 g in sodium chloride 0.9 % 100 mL IVPB     2 g 200 mL/hr over 30 Minutes Intravenous  Once 02/08/2019 1912 02/13/2019 2132   02/17/2019 1930  vancomycin (VANCOCIN) 1,750 mg in sodium chloride 0.9 % 500 mL IVPB  Status:  Discontinued     1,750 mg 250 mL/hr over 120 Minutes Intravenous  Once 02/16/2019 1929 02/14/2019 2322   02/19/2019 1915  vancomycin (VANCOCIN) IVPB 1000 mg/200 mL premix  Status:  Discontinued     1,000 mg 200 mL/hr over 60 Minutes Intravenous  Once 02/21/2019 1912 02/11/2019 1929       Time Spent in minutes  30   [name] [name] M.D on 02/27/2019 at 10:48 AM  To page go to [email or web address] - password [facility]  [facility] -  Office  [phone]   See all Orders from today for further details    Objective:   Vitals:   02/27/19 0500 02/27/19 0600 02/27/19 0700 02/27/19 0801  BP:  125/81  124/64  Pulse: 89 84 (!) 54   Resp: (!) 24 16 14    Temp:    97.7 F (36.5 C)  TempSrc:    Rectal  SpO2: 100% 100% 100%   Weight:      Height:        Wt Readings from Last 3 Encounters:  02/04/2019 88 kg  11/25/18 100.4 kg  10/27/18 102.7 kg     Intake/Output Summary (Last 24 hours) at 02/27/2019 1048 Last data filed at 02/27/2019 0751 Gross per 24 hour  Intake 1622.48 ml  Output 650 ml  Net 972.48 ml     Physical Exam  Somnolent appears  quite frail and weak, much weaker than yesterday, unable to answer questions CTAB RRR no new murmurs Abdomen soft No edema   Data Review:    CBC Recent Labs  Lab 02/23/19 0538 02/24/19 0425 02/25/19 0400 02/26/19 0230 02/27/19 0250  WBC 4.8 3.9* 3.7* 6.6 9.3  HGB 13.2 14.1 13.9 12.4 13.3  HCT 43.8 46.1* 46.2* 40.5 42.4  PLT 190 197 195 192 155  MCV 87.3 86.5 85.7 84.4 84.8  MCH 26.3 26.5 25.8* 25.8* 26.6  MCHC 30.1 30.6 30.1 30.6 31.4  RDW 16.9* 17.0* 17.2* 16.8* 17.3*  LYMPHSABS 0.4* 0.2* 0.3* 0.3* 0.3*  MONOABS 0.1 0.1 0.1 0.3 0.2  EOSABS 0.0 0.0 0.0 0.0 0.0  BASOSABS 0.0 0.0 0.0 0.0 0.0    Chemistries  Recent Labs  Lab 02/23/19 0538 02/24/19 0425 02/25/19 0400 02/26/19 0230 02/27/19 0250  NA 143 142 142 142 146*  K 4.2 4.1 4.3 3.9 3.4*  CL 112* 112* 113* 113* 119*  CO2 19* 13* 15* 18* 18*  GLUCOSE 49* 232* 274* 180* 164*  BUN 76* 72* 75* 92* 94*  CREATININE 3.61* 3.13* 2.78* 3.19* 3.04*  CALCIUM 8.0* 8.3* 8.8* 8.3* 7.8*  AST 28 31 18 16 15   ALT 11 6 10 9 10   ALKPHOS 104 108 105 90 95  BILITOT 0.8 0.9 1.2 1.0 1.0   ------------------------------------------------------------------------------------------------------------------ No results for input(s): CHOL, HDL, LDLCALC, TRIG, CHOLHDL, LDLDIRECT in the last 72 hours.  Lab Results  Component Value Date   HGBA1C 6.3 (H) 10/14/2016   ------------------------------------------------------------------------------------------------------------------ Recent Labs    02/25/19 1216  TSH 1.468    Cardiac Enzymes No results for input(s): CKMB, TROPONINI, MYOGLOBIN in the last 168 hours.  Invalid input(s): CK ------------------------------------------------------------------------------------------------------------------    Component Value Date/Time   BNP 150.7 (H) 02/26/2019 0500   BNP 607.5 (H) 09/27/2015 1500    Micro Results Recent Results (from the past 240 hour(s))  Blood Culture (routine x  2)     Status: Abnormal   Collection Time: 02/06/2019  1:20 PM  Result Value Ref Range Status   Specimen Description BLOOD RIGHT ANTECUBITAL  Final   Special Requests   Final    BOTTLES DRAWN AEROBIC AND ANAEROBIC Blood Culture results may not be optimal due to an inadequate volume of blood received in culture bottles   Culture  Setup Time   Final    GRAM POSITIVE COCCI AEROBIC BOTTLE ONLY CRITICAL RESULT CALLED TO, READ BACK BY AND VERIFIED WITH: Asencion Islam PharmD 10:05 02/24/19 ([email or web address])    Culture (A)  Final    STAPHYLOCOCCUS SPECIES (COAGULASE NEGATIVE) THE SIGNIFICANCE OF ISOLATING THIS ORGANISM FROM A SINGLE SET OF BLOOD CULTURES WHEN MULTIPLE SETS ARE DRAWN IS UNCERTAIN. PLEASE NOTIFY THE MICROBIOLOGY DEPARTMENT WITHIN ONE WEEK IF SPECIATION AND SENSITIVITIES ARE REQUIRED. Performed at [facility], [place] [address], [place], [place] [address]    Report Status 02/26/2019 FINAL  Final  Blood Culture ID Panel (Reflexed)     Status: Abnormal   Collection Time: 02/20/2019  1:20 PM  Result Value Ref Range Status   Enterococcus species NOT DETECTED NOT DETECTED Final   Listeria monocytogenes NOT DETECTED NOT DETECTED Final   Staphylococcus species DETECTED (A) NOT DETECTED Final    Comment: Methicillin (oxacillin) susceptible coagulase negative staphylococcus. Possible blood culture contaminant (unless isolated from more than one blood culture draw or clinical case suggests pathogenicity). No antibiotic treatment is indicated for blood  culture contaminants. CRITICAL RESULT CALLED TO, READ BACK BY AND VERIFIED WITH: Asencion Islam PharmD 10:05 02/24/19 (wilsonm)    Staphylococcus aureus (BCID) NOT DETECTED NOT DETECTED Final   Methicillin resistance NOT DETECTED NOT DETECTED Final   Streptococcus species NOT DETECTED NOT DETECTED Final   Streptococcus agalactiae NOT DETECTED NOT DETECTED Final   Streptococcus pneumoniae NOT DETECTED NOT DETECTED Final   Streptococcus pyogenes NOT  DETECTED NOT DETECTED Final   Acinetobacter baumannii NOT DETECTED NOT DETECTED Final   Enterobacteriaceae species NOT DETECTED NOT DETECTED Final   Enterobacter cloacae complex NOT DETECTED NOT DETECTED Final   Escherichia coli NOT DETECTED NOT  DETECTED Final   Klebsiella oxytoca NOT DETECTED NOT DETECTED Final   Klebsiella pneumoniae NOT DETECTED NOT DETECTED Final   Proteus species NOT DETECTED NOT DETECTED Final   Serratia marcescens NOT DETECTED NOT DETECTED Final   Haemophilus influenzae NOT DETECTED NOT DETECTED Final   Neisseria meningitidis NOT DETECTED NOT DETECTED Final   Pseudomonas aeruginosa NOT DETECTED NOT DETECTED Final   Candida albicans NOT DETECTED NOT DETECTED Final   Candida glabrata NOT DETECTED NOT DETECTED Final   Candida krusei NOT DETECTED NOT DETECTED Final   Candida parapsilosis NOT DETECTED NOT DETECTED Final   Candida tropicalis NOT DETECTED NOT DETECTED Final    Comment: Performed at [facility], [place] [address], [place], [place] [address]  Blood Culture (routine x 2)     Status: None (Preliminary result)   Collection Time: 01/31/2019  1:35 PM  Result Value Ref Range Status   Specimen Description BLOOD RIGHT HAND  Final   Special Requests   Final    BOTTLES DRAWN AEROBIC ONLY Blood Culture results may not be optimal due to an inadequate volume of blood received in culture bottles   Culture  Setup Time   Final    AEROBIC BOTTLE ONLY GRAM POSITIVE COCCI CRITICAL RESULT CALLED TO, READ BACK BY AND VERIFIED WITH: Asencion Islam PharmD 10:05 02/24/19 ([email or web address]) Performed at [facility], [address], [place],  [address]    Culture [facility] POSITIVE COCCI  Final   Report Status PENDING  Incomplete  SARS Coronavirus 2 (CEPHEID- Performed in [place] hospital lab), Hosp Order     Status: Abnormal   Collection Time: 01/31/2019  1:40 PM  Result Value Ref Range Status   SARS Coronavirus 2 POSITIVE (A) NEGATIVE Final    Comment: RESULT CALLED TO,  READ BACK BY AND VERIFIED WITH: M. Coffey RN 14:50 02/03/2019 ([email or web address]) (NOTE) If result is NEGATIVE SARS-CoV-2 target nucleic acids are NOT DETECTED. The SARS-CoV-2 RNA is generally detectable in upper and lower  respiratory specimens during the acute phase of infection. The lowest  concentration of SARS-CoV-2 viral copies this assay can detect is 250  copies / mL. A negative result does not preclude SARS-CoV-2 infection  and should not be used as the sole basis for treatment or other  patient management decisions.  A negative result may occur with  improper specimen collection / handling, submission of specimen other  than nasopharyngeal swab, presence of viral mutation(s) within the  areas targeted by this assay, and inadequate number of viral copies  (<250 copies / mL). A negative result must be combined with clinical  observations, patient history, and epidemiological information. If result is POSITIVE SARS-CoV-2 target nucleic acids are DETECTED.  The SARS-CoV-2 RNA is generally detectable in upper and lower  respiratory specimens during the acute phase of infection.  Positive  results are indicative of active infection with SARS-CoV-2.  Clinical  correlation with patient history and other diagnostic information is  necessary to determine patient infection status.  Positive results do  not rule out bacterial infection or co-infection with other viruses. If result is PRESUMPTIVE POSTIVE SARS-CoV-2 nucleic acids MAY BE PRESENT.   A presumptive positive result was obtained on the submitted specimen  and confirmed on repeat testing.  While 2019 novel coronavirus  (SARS-CoV-2) nucleic acids may be present in the submitted sample  additional confirmatory testing may be necessary for epidemiological  and / or clinical management purposes  to differentiate between  SARS-CoV-2 and other Sarbecovirus  currently known to infect humans.  If clinically indicated additional testing with an  alternate test  methodology [phone]) i s advised. The SARS-CoV-2 RNA is generally  detectable in upper and lower respiratory specimens during the acute  phase of infection. The expected result is Negative. Fact Sheet for Patients:  StrictlyIdeas.no Fact Sheet for Healthcare Providers: BankingDealers.co.za This test is not yet approved or cleared by the [place] FDA and has been authorized for detection and/or diagnosis of SARS-CoV-2 by FDA under an Emergency Use Authorization (EUA).  This EUA will remain in effect (meaning this test can be used) for the duration of the COVID-19 declaration under Section 564(b)(1) of the Act, 21 U.S.C. section 360bbb-3(b)(1), unless the authorization is terminated or revoked sooner. Performed at [facility], [place] [address] [place], [place] [address]     Radiology Reports Dg Chest Port 1 View  Result Date: 02/25/2019 CLINICAL DATA:  PICC line placement EXAM: PORTABLE CHEST 1 VIEW COMPARISON:  02/25/2019 FINDINGS: Placement of PICC line with tip in distal SVC. Normal cardiac silhouette. Probable hiatal hernia. Fine interstitial lung pattern. No pneumothorax IMPRESSION: PICC line in appropriate position. Interstitial edema / pulmonary edema pattern. Electronically Signed   By: Suzy Bouchard M.D.   On: 02/25/2019 16:56   Dg Chest Port 1 View  Result Date: 02/25/2019 CLINICAL DATA:  Shortness of breath. EXAM: PORTABLE CHEST 1 VIEW COMPARISON:  02/01/2019.  10/18/2018.  05/20/2018. FINDINGS: Mediastinum and hilar structures normal. Cardiomegaly. No pulmonary venous congestion. Prominence of interstitial markings. A component these interstitial changes may be chronic however an active interstitial process including pneumonitis and/or interstitial edema cannot be excluded. No pleural effusion or pneumothorax. Carotid vascular calcification. Sliding hiatal hernia. IMPRESSION: 1. Cardiac pacer with lead  tips over the right atrium and right ventricle. Stable cardiomegaly. No pulmonary venous congestion. 2. Prominence of interstitial markings. A component these interstitial changes may be chronic however an active interstitial process including pneumonitis and/or interstitial edema cannot be excluded. No pleural effusion or pneumothorax. 3.  Carotid vascular disease. Electronically Signed   By: Marcello Moores  Register   On: 02/25/2019 12:57   Dg Chest Port 1 View  Result Date: 02/08/2019 CLINICAL DATA:  Low O2 sats.  Shortness of breath. EXAM: PORTABLE CHEST 1 VIEW COMPARISON:  10/18/2018 FINDINGS: Patient is rotated to the right displacing cardiomediastinal anatomy into the medial right hemithorax. Interstitial markings are diffusely coarsened with chronic features. The cardio pericardial silhouette is enlarged. Permanent pacemaker noted. Bones are diffusely demineralized. IMPRESSION: Cardiomegaly with underlying diffuse chronic interstitial lung disease. Interstitial markings are more prominent today raising the question of interstitial edema although this may be related to positioning. No substantial pleural effusion. Electronically Signed   By: Misty Stanley M.D.   On: 02/24/2019 14:13   [place] Ekg Site Rite  Result Date: 02/25/2019 If Site Rite image not attached, placement could not be confirmed due to current cardiac rhythm.

## 2019-02-27 NOTE — Progress Notes (Signed)
Spoke with patient's daughter and updated her on the status of her mother. Patient able to [facility] with her daughter.

## 2019-02-27 NOTE — Progress Notes (Signed)
Unable to give night time PO medications due to patient's mental status.  She is lethargic and not following commands.  Will continue to monitor patient.  Earleen Reaper RN

## 2019-02-27 NOTE — Progress Notes (Signed)
Spoke with patient's daughter this evening to update on patient's condition.  Daughter was able to speak with patient via Face Time.  Patient did awaken more and attempted to speak with daughter.  All questions answered.  Earleen Reaper RN

## 2019-02-27 NOTE — Progress Notes (Signed)
ANTICOAGULATION CONSULT NOTE - Follow Up Consult  Pharmacy Consult for Warfarin Indication: atrial fibrillation  Allergies  Allergen Reactions  . Amiodarone Shortness Of Breath and Nausea Only    "Toxicity," also  . Flecainide Shortness Of Breath and Other (See Comments)    "Toxicity," also   . Morphine And Related Nausea And Vomiting and Other (See Comments)    Family requests no morphine due to past reaction. Pt stated it made her sick, severe nausea and vomiting  . Bactrim Ds [Sulfamethoxazole-Trimethoprim] Diarrhea  . Cephalexin Nausea And Vomiting  . Pineapple Itching    Severe itching   Labs: Recent Labs    02/25/19 0400 02/26/19 0230 02/27/19 0250  HGB 13.9 12.4 13.3  HCT 46.2* 40.5 42.4  PLT 195 192 155  LABPROT 28.6* 33.4* 38.9*  INR 2.7* 3.3* 4.1*  CREATININE 2.78* 3.19* 3.04*    Estimated Creatinine Clearance: 16.6 mL/min (A) (by C-G formula based on SCr of 3.04 mg/dL (H)).   Medical History: Past Medical History:  Diagnosis Date  . Acute gastric ulcer without mention of hemorrhage, perforation, or obstruction 2006   EGD  . Anxiety   . Atrial fibrillation (HCC)    flecainide; coumadin  . Breast cancer ([place])    left  . Carotid artery disease ([place])    16/10: RICA 9-60%, LICA 45-40%  //  [place] 98/11: RICA 9-14%; LICA 78-29% >> FU 1 year   . CHF (congestive heart failure) ([place])   . Chronic diastolic heart failure (HCC)    a. echo 10/10: mild LVH, EF 55-60%, mild AI, mild MR, mild to mod LAE, mild RVE, severe RAE, mod TR, PASP 53  /  b.  Echo 3/14:  Mild LVH, EF 55%, Tr AI, MAC, mild MR, mod LAE, PASP 31, trivial eff. // c. echo 1/17: EF 55-60%, mild AI, MAC, mild MR, moderate BAE, moderate TR  . Chronic lower back pain   . Dementia without behavioral disturbance ([place]) 11/24/2018  . Diabetic peripheral neuropathy ([place]) 03/09/2015  . Diverticulosis   . Esophagitis, unspecified 2006   EGD  . Hiatal hernia 2006   EGD  . History of nuclear stress test     a. Myoview 2/12: EF 69%, no scar or ischemia  . Hx of adenomatous colonic polyps   . Hypertension   . Mediastinal lymphadenopathy   . Memory difficulty 11/14/2016  . Mitral regurgitation    mild, echo, October, 2010  . Osteopenia   . Presence of permanent cardiac pacemaker   . Pulmonary embolus [facility]) 2006   2006, with DVT  . Pulmonary HTN ([place])    53 mmHg echo, 2010, moderate TR, mild right ventricular enlargement  . Tachy-brady syndrome ([place])    s/p pacer 07/2009  . Thyroid nodule    non-neoplastic goiter  . Type II diabetes mellitus ([place])   . Venous insufficiency    Chronic    Medications:  Scheduled:  . bethanechol  25 mg Oral BID  . diltiazem  120 mg Oral Daily  . famotidine  20 mg Oral Daily  . feeding supplement (PRO-STAT SUGAR FREE 64)  30 mL Oral TID WC  . insulin aspart  0-15 Units Subcutaneous Q6H  . mouth rinse  15 mL Mouth Rinse BID  . memantine  5 mg Oral BID  . methylPREDNISolone (SOLU-MEDROL) injection  20 mg Intravenous Daily  . QUEtiapine  25 mg Oral Daily  . tamsulosin  0.4 mg Oral Daily  . vitamin C  500 mg  Oral Daily  . Warfarin - Pharmacist Dosing Inpatient   Does not apply q1800  . zinc sulfate  220 mg Oral Daily   Infusions:  . dextrose 5 % with kcl 50 mL/hr at 02/27/19 0927    Assessment: Pt is a 82 yoF on warfarin PTA for atrial fibrillation. PTA dose variable (1-2 mg daily). Most recently, she was taking 2 mg daily, last administered 5/24. Per nursing [facility], she was started on Levaquin 5/16, last dose yesterday 5/24. INR 2.8 on admission   INR up again today 4.1 despite holding dose yesterday. Of note, patient is more lethargic now and unable to take PO meds. H/H and plt wnl   Goal of Therapy:  INR 2-3 Monitor platelets by anticoagulation protocol: Yes   Plan:  -No coumadin today. Patient will need a very low dose of warfarin when INR back in range and possibly even q48hr dosing  -Daily INR and CBC. -Monitor for signs/symptoms of  bleeding   Albertina Parr, PharmD., BCPS Clinical Pharmacist Clinical phone for 02/27/19 until 5pm: [phone]

## 2019-02-28 LAB — PROTIME-INR
INR: 5.6 (ref 0.8–1.2)
Prothrombin Time: 49.4 seconds — ABNORMAL HIGH (ref 11.4–15.2)

## 2019-02-28 LAB — CULTURE, BLOOD (ROUTINE X 2)

## 2019-02-28 LAB — GLUCOSE, CAPILLARY
Glucose-Capillary: 229 mg/dL — ABNORMAL HIGH (ref 70–99)
Glucose-Capillary: 238 mg/dL — ABNORMAL HIGH (ref 70–99)

## 2019-02-28 MED ORDER — GLYCOPYRROLATE 1 MG PO TABS: 1.0000 mg | ORAL_TABLET | ORAL | Status: DC | PRN

## 2019-02-28 MED ORDER — POLYVINYL ALCOHOL 1.4 % OP SOLN: 1.0000 [drp] | Freq: Four times a day (QID) | OPHTHALMIC | Status: DC | PRN

## 2019-02-28 MED ORDER — GLYCOPYRROLATE 0.2 MG/ML IJ SOLN: 0.2000 mg | INTRAMUSCULAR | Status: DC | PRN

## 2019-02-28 MED ORDER — ACETAMINOPHEN 325 MG PO TABS: 650.0000 mg | ORAL_TABLET | Freq: Four times a day (QID) | ORAL | Status: DC | PRN

## 2019-02-28 MED ORDER — HALOPERIDOL LACTATE 5 MG/ML IJ SOLN
0.5000 mg | INTRAMUSCULAR | Status: DC | PRN
  Administered 2019-02-28: 16:00:00 0.5 mg via INTRAVENOUS
  Filled 2019-02-28: qty 1

## 2019-02-28 MED ORDER — DILTIAZEM HCL 25 MG/5ML IV SOLN
10.0000 mg | Freq: Four times a day (QID) | INTRAVENOUS | Status: DC | PRN
  Administered 2019-02-28: 09:00:00 10 mg via INTRAVENOUS
  Filled 2019-02-28 (×2): qty 5

## 2019-02-28 MED ORDER — HYDROMORPHONE HCL 1 MG/ML IJ SOLN
0.5000 mg | INTRAMUSCULAR | Status: DC | PRN
  Administered 2019-02-28 (×2): 0.5 mg via INTRAVENOUS
  Filled 2019-02-28 (×2): qty 0.5

## 2019-02-28 MED ORDER — ACETAMINOPHEN 650 MG RE SUPP: 650.0000 mg | Freq: Four times a day (QID) | RECTAL | Status: DC | PRN

## 2019-02-28 MED ORDER — HALOPERIDOL LACTATE 2 MG/ML PO CONC: 0.5000 mg | ORAL | Status: DC | PRN

## 2019-02-28 MED ORDER — ONDANSETRON 4 MG PO TBDP: 4.0000 mg | ORAL_TABLET | Freq: Four times a day (QID) | ORAL | Status: DC | PRN

## 2019-02-28 MED ORDER — SODIUM CHLORIDE 0.9 % IV SOLN
0.5000 mg/h | INTRAVENOUS | Status: DC
  Administered 2019-02-28: 0.5 mg/h via INTRAVENOUS
  Filled 2019-02-28: qty 2.5

## 2019-02-28 MED ORDER — BISACODYL 10 MG RE SUPP: 10.0000 mg | Freq: Every day | RECTAL | Status: DC | PRN

## 2019-02-28 MED ORDER — GLYCOPYRROLATE 0.2 MG/ML IJ SOLN
0.2000 mg | INTRAMUSCULAR | Status: DC | PRN
  Administered 2019-02-28: 0.2 mg via INTRAVENOUS
  Filled 2019-02-28: qty 1

## 2019-02-28 MED ORDER — HALOPERIDOL 0.5 MG PO TABS: 0.5000 mg | ORAL_TABLET | ORAL | Status: DC | PRN

## 2019-02-28 MED ORDER — ONDANSETRON HCL 4 MG/2ML IJ SOLN: 4.0000 mg | Freq: Four times a day (QID) | INTRAMUSCULAR | Status: DC | PRN

## 2019-03-01 NOTE — Progress Notes (Signed)
Patient is tense and has facial grimacing unable to have PRN.Marland Kitchen Notified dr Wallace Keller see new orders  \

## 2019-03-01 NOTE — Progress Notes (Signed)
Patient's dilaudid drip discontinued.  30 cc Dilaudid wasted into the Steri Cycle container.  Witnessed by Jewel Baize RN and me.  Earleen Reaper RN

## 2019-03-01 NOTE — Progress Notes (Deleted)
Called [employer] to request pacemake to be turned off. Waiting for a call back from rep. Magnet currently over pacemaker.

## 2019-03-01 NOTE — Progress Notes (Signed)
PROGRESS NOTE                                                                                                                                                                                                             Patient Demographics:    [name] [name], is a 85 y.o. female, DOB - 06-19-1931, BOF:[id number]  Outpatient Primary MD for the patient is [name] Koch, MD    LOS - 6  Admit date - 02/27/2019    Chief Complaint  Patient presents with  . low oxygen  . lethargic       Brief Narrative    [name] [name] is a 85 y.o. female with medical history significant of severe underlying dementia, A. fib, DVT/PE on Coumadin, tachy-brady  syndrome s/p PPM, breast cancer s/p mastectomy, chronic diastolic heart failure, DM type II, PAH, CKD stage IV, and frequent UTIs; who presents with reports of low oxygenation from [facility], she was diagnosed with COVID-19 pneumonitis and admitted to the [facility] hospital.   Subjective:   Patient in bed, appears more lethargic and frail, today unable to answer questions or cooperate in exam.   Assessment  & Plan :     1. Acute Hypoxic Resp. Failure due to Acute Covid 19 Viral Illness during the ongoing 2020 Covid 19 Pandemic -  CXR suggests pneumonitis with mild hypoxia requiring 1 to 2 L oxygen, inflammatory markers were borderline, placed on IV Solu-Medrol and Actemra was given on 02/24/2019, her oxygenation is stable and she is currently between 1 to 2 L nasal cannula oxygen.  I-S flutter valve if she can use and tolerate for pulmonary toiletry.     Overall extremely frail and due to underlying dementia and now due to toxic encephalopathy unable to take proper diet or participate in pulmonary toiletry which makes her condition more tenuous.    I had multiple discussions with patient's family members including her daughter who is the main decision maker on several days finally  on Mar 18, 2019 it was decided that patient will be transition to full comfort care as she continues to decline.  She has virtually no oral intake for the last 2 days and is now more and more obtunded.  She will be transition to full comfort care on Mar 18, 2019 with the expectation that she will pass away this admission.  ABG     Component Value Date/Time   PHART 7.497 (H) 10/18/2018 1417   PCO2ART 28.3 (L) 10/18/2018 1417   PO2ART 62.0 (L) 10/18/2018 1417   HCO3 21.9 10/18/2018 1417   TCO2 23 10/18/2018 1417   O2SAT 94.0 10/18/2018 1417    COVID-19 Labs  Recent Labs    02/26/19 0230 02/27/19 0250  DDIMER 7.75* 9.17*  FERRITIN 255 217  CRP 8.3* 5.8*    Lab Results  Component Value Date   SARSCOV2NAA POSITIVE (A) 02/12/2019        Component Value Date/Time   BNP 150.7 (H) 02/26/2019 0500   BNP 607.5 (H) 09/27/2015 1500      2.  Acute on chronic combined systolic and diastolic CHF, recent echo 45% - clinically dehydrated continue to monitor.  Unable to take oral medications hence oral diltiazem on hold, PRN IV diltiazem added for rate control of underlying A. fib if needed.  3.  Chronic dementia with delirium and toxic encephalopathy.  On Namenda which will be continued, still getting quite agitated at nighttime and aggressive towards the staff, low-dose Haldol given on 02/23/2019 night seems to have made her very somnolent, will try nighttime Seroquel instead, soft mittens for patient and staff protection.  Now on full comfort medications.  4.  History of DVT PE.  On Coumadin with last INR 5.6 on Mar 17, 2019, now comfort care.  5.  Chronic atrial fibrillation [place] vas 2 score of at least 4.  Coumadin and PRN IV diltiazem, cannot take oral diltiazem right now due to somnolence.  Blood pressure too low stopped beta-blocker.  6.  ARF on CKD stage IV.  Baseline creatinine 2.3.  Was hydrated and now transition to full comfort care.   7.  GERD.  Placed on PPI.  8.  UTI.   Treated with Maxipime this admission.    9.  Mild hypothermia-  blood cultures overall clinically negative except for 1 out of 2 contamination with coag negative staph.  Her random TSH and cortisol are stable, continue supportive care with bear hugger and increasing room temperature.  This problem currently improved.  10.  Urinary retention.  Developed 02/25/2019.  Foley and Flomax and monitor.   11.  Hypernatremia and hypokalemia.  Electrolytes were replaced now comfort care.   12.  DM type II.  Currently in poor control due to hypoglycemia.  Currently on sliding scale and monitor.  Lab Results  Component Value Date   HGBA1C 6.3 (H) 10/14/2016   CBG (last 3)  Recent Labs    02/27/19 1442 02/27/19 2041 2019-03-17 0429  GLUCAP 184* 193* 229*     Condition -  Guarded  Family Communication  :  Daughter understands the risks and benefits of using Actemra, accepts it and would like to use it if needed, DNR, overall prognosis remains guarded daughter updated again on 02/24/2019.  If declines full comfort, confirmed again on 02/26/2019.  Code Status :  DNR  Diet : Soft  Disposition Plan  :  SNF  Consults  :  None  Procedures  :  None  PUD Prophylaxis : PPI  DVT Prophylaxis  :  Coumadin  Lab Results  Component Value Date   INR 5.6 (HH) 2019-03-17   INR 4.1 (HH) 02/27/2019   INR 3.3 (H) 02/26/2019     Lab Results  Component Value Date   PLT 155 02/27/2019    Inpatient Medications  Scheduled Meds: . bethanechol  25 mg Oral BID  . insulin  aspart  0-15 Units Subcutaneous Q6H  . mouth rinse  15 mL Mouth Rinse BID  . memantine  5 mg Oral BID  . tamsulosin  0.4 mg Oral Daily   Continuous Infusions: .  ceFAZolin (ANCEF) IV Stopped (03-07-19 0916)   PRN Meds:.acetaminophen **OR** acetaminophen, albuterol, bisacodyl, diltiazem, glycopyrrolate **OR** glycopyrrolate **OR** glycopyrrolate, haloperidol **OR** haloperidol **OR** haloperidol lactate, HYDROmorphone (DILAUDID)  injection, [DISCONTINUED] ondansetron **OR** ondansetron (ZOFRAN) IV, ondansetron **OR** ondansetron (ZOFRAN) IV, polyvinyl alcohol  Antibiotics  :    Anti-infectives (From admission, onward)   Start     Dose/Rate Route Frequency Ordered Stop   02/27/19 2030  ceFAZolin (ANCEF) IVPB 2g/100 mL premix     2 g 200 mL/hr over 30 Minutes Intravenous Every 12 hours 02/27/19 1948     02/24/19 2000  vancomycin (VANCOCIN) IVPB 750 mg/150 ml premix  Status:  Discontinued     750 mg 150 mL/hr over 60 Minutes Intravenous Every 48 hours 02/09/2019 1930 02/11/2019 2322   02/23/19 2000  ceFEPIme (MAXIPIME) 2 g in sodium chloride 0.9 % 100 mL IVPB  Status:  Discontinued     2 g 200 mL/hr over 30 Minutes Intravenous Every 24 hours 02/03/2019 1930 02/26/19 1101   02/06/2019 2030  metroNIDAZOLE (FLAGYL) IVPB 500 mg  Status:  Discontinued     500 mg 100 mL/hr over 60 Minutes Intravenous Every 8 hours 02/15/2019 1912 02/23/19 0932   02/17/2019 2015  ceFEPIme (MAXIPIME) 2 g in sodium chloride 0.9 % 100 mL IVPB     2 g 200 mL/hr over 30 Minutes Intravenous  Once 02/08/2019 1912 02/09/2019 2132   02/27/2019 1930  vancomycin (VANCOCIN) 1,750 mg in sodium chloride 0.9 % 500 mL IVPB  Status:  Discontinued     1,750 mg 250 mL/hr over 120 Minutes Intravenous  Once 02/25/2019 1929 02/03/2019 2322   02/15/2019 1915  vancomycin (VANCOCIN) IVPB 1000 mg/200 mL premix  Status:  Discontinued     1,000 mg 200 mL/hr over 60 Minutes Intravenous  Once 02/18/2019 1912 02/24/2019 1929       Time Spent in minutes  30   Lala Lund M.D on 03/07/2019 at 12:39 PM  To page go to [email or web address] - password [facility]  [facility] -  Office  [phone]   See all Orders from today for further details    Objective:   Vitals:   2019-03-07 0600 03-07-2019 0800 03-07-2019 0900 2019-03-07 1208  BP: 113/70 (!) 144/96 (!) 143/93 124/90  Pulse: (!) 109 65    Resp: 17 16    Temp:  97.8 F (36.6 C)    TempSrc:  Rectal    SpO2: 100% 100%  100%  Weight:       Height:        Wt Readings from Last 3 Encounters:  01/30/2019 88 kg  11/25/18 100.4 kg  10/27/18 102.7 kg     Intake/Output Summary (Last 24 hours) at 2019/03/07 1239 Last data filed at March 07, 2019 1200 Gross per 24 hour  Intake 1423.23 ml  Output 1450 ml  Net -26.77 ml     Physical Exam  Somnolent appears quite frail and weak, much weaker than yesterday, unable to answer questions CTAB RRR no new murmurs Abdomen soft No edema   Data Review:    CBC Recent Labs  Lab 02/23/19 0538 02/24/19 0425 02/25/19 0400 02/26/19 0230 02/27/19 0250  WBC 4.8 3.9* 3.7* 6.6 9.3  HGB 13.2 14.1 13.9 12.4 13.3  HCT 43.8 46.1* 46.2* 40.5 42.4  PLT 190 197 195 192 155  MCV 87.3 86.5 85.7 84.4 84.8  MCH 26.3 26.5 25.8* 25.8* 26.6  MCHC 30.1 30.6 30.1 30.6 31.4  RDW 16.9* 17.0* 17.2* 16.8* 17.3*  LYMPHSABS 0.4* 0.2* 0.3* 0.3* 0.3*  MONOABS 0.1 0.1 0.1 0.3 0.2  EOSABS 0.0 0.0 0.0 0.0 0.0  BASOSABS 0.0 0.0 0.0 0.0 0.0    Chemistries  Recent Labs  Lab 02/23/19 0538 02/24/19 0425 02/25/19 0400 02/26/19 0230 02/27/19 0250  NA 143 142 142 142 146*  K 4.2 4.1 4.3 3.9 3.4*  CL 112* 112* 113* 113* 119*  CO2 19* 13* 15* 18* 18*  GLUCOSE 49* 232* 274* 180* 164*  BUN 76* 72* 75* 92* 94*  CREATININE 3.61* 3.13* 2.78* 3.19* 3.04*  CALCIUM 8.0* 8.3* 8.8* 8.3* 7.8*  AST 28 31 18 16 15   ALT 11 6 10 9 10   ALKPHOS 104 108 105 90 95  BILITOT 0.8 0.9 1.2 1.0 1.0   ------------------------------------------------------------------------------------------------------------------ No results for input(s): CHOL, HDL, LDLCALC, TRIG, CHOLHDL, LDLDIRECT in the last 72 hours.  Lab Results  Component Value Date   HGBA1C 6.3 (H) 10/14/2016   ------------------------------------------------------------------------------------------------------------------ No results for input(s): TSH, T4TOTAL, T3FREE, THYROIDAB in the last 72 hours.  Invalid input(s): FREET3  Cardiac Enzymes No results for  input(s): CKMB, TROPONINI, MYOGLOBIN in the last 168 hours.  Invalid input(s): CK ------------------------------------------------------------------------------------------------------------------    Component Value Date/Time   BNP 150.7 (H) 02/26/2019 0500   BNP 607.5 (H) 09/27/2015 1500    Micro Results Recent Results (from the past 240 hour(s))  Blood Culture (routine x 2)     Status: Abnormal   Collection Time: 02/04/2019  1:20 PM  Result Value Ref Range Status   Specimen Description BLOOD RIGHT ANTECUBITAL  Final   Special Requests   Final    BOTTLES DRAWN AEROBIC AND ANAEROBIC Blood Culture results may not be optimal due to an inadequate volume of blood received in culture bottles   Culture  Setup Time   Final    GRAM POSITIVE COCCI AEROBIC BOTTLE ONLY CRITICAL RESULT CALLED TO, READ BACK BY AND VERIFIED WITH: Asencion Islam PharmD 10:05 02/24/19 ([email or web address])    Culture (A)  Final    STAPHYLOCOCCUS EPIDERMIDIS CORRECTED ON 05/31 AT 0733: PREVIOUSLY REPORTED AS STAPHYLOCOCCUS SPECIES (COAGULASE NEGATIVE) THE SIGNIFICANCE OF ISOLATING THIS ORGANISM FROM A SINGLE SET OF BLOOD CULTURES WHEN MULTIPLE SETS ARE DRAWN IS UNCERTAIN. PLEASE NOTIFY THE MICROBIOLOGY DEPARTMENT WITHIN ONE WEEK IF SPECIATION AND SENSITIVITIES ARE REQUIRED. Performed at [facility], [place] [address], [place], [place] [address]    Report Status 02/26/2019 FINAL  Final  Blood Culture ID Panel (Reflexed)     Status: Abnormal   Collection Time: 02/02/2019  1:20 PM  Result Value Ref Range Status   Enterococcus species NOT DETECTED NOT DETECTED Final   Listeria monocytogenes NOT DETECTED NOT DETECTED Final   Staphylococcus species DETECTED (A) NOT DETECTED Final    Comment: Methicillin (oxacillin) susceptible coagulase negative staphylococcus. Possible blood culture contaminant (unless isolated from more than one blood culture draw or clinical case suggests pathogenicity). No antibiotic treatment is indicated for  blood  culture contaminants. CRITICAL RESULT CALLED TO, READ BACK BY AND VERIFIED WITH: Asencion Islam PharmD 10:05 02/24/19 (wilsonm)    Staphylococcus aureus (BCID) NOT DETECTED NOT DETECTED Final   Methicillin resistance NOT DETECTED NOT DETECTED Final   Streptococcus species NOT DETECTED NOT DETECTED Final   Streptococcus agalactiae NOT DETECTED NOT DETECTED Final   Streptococcus  pneumoniae NOT DETECTED NOT DETECTED Final   Streptococcus pyogenes NOT DETECTED NOT DETECTED Final   Acinetobacter baumannii NOT DETECTED NOT DETECTED Final   Enterobacteriaceae species NOT DETECTED NOT DETECTED Final   Enterobacter cloacae complex NOT DETECTED NOT DETECTED Final   Escherichia coli NOT DETECTED NOT DETECTED Final   Klebsiella oxytoca NOT DETECTED NOT DETECTED Final   Klebsiella pneumoniae NOT DETECTED NOT DETECTED Final   Proteus species NOT DETECTED NOT DETECTED Final   Serratia marcescens NOT DETECTED NOT DETECTED Final   Haemophilus influenzae NOT DETECTED NOT DETECTED Final   Neisseria meningitidis NOT DETECTED NOT DETECTED Final   Pseudomonas aeruginosa NOT DETECTED NOT DETECTED Final   Candida albicans NOT DETECTED NOT DETECTED Final   Candida glabrata NOT DETECTED NOT DETECTED Final   Candida krusei NOT DETECTED NOT DETECTED Final   Candida parapsilosis NOT DETECTED NOT DETECTED Final   Candida tropicalis NOT DETECTED NOT DETECTED Final    Comment: Performed at [facility], [place] [address] [place], [place] [address]  Blood Culture (routine x 2)     Status: Abnormal (Preliminary result)   Collection Time: 02/05/2019  1:35 PM  Result Value Ref Range Status   Specimen Description BLOOD RIGHT HAND  Final   Special Requests   Final    BOTTLES DRAWN AEROBIC ONLY Blood Culture results may not be optimal due to an inadequate volume of blood received in culture bottles   Culture  Setup Time   Final    AEROBIC BOTTLE ONLY GRAM POSITIVE COCCI CRITICAL RESULT CALLED TO, READ BACK BY  AND VERIFIED WITH: Asencion Islam PharmD 10:05 02/24/19 (wilsonm)    Culture (A)  Final    STAPHYLOCOCCUS SPECIES (COAGULASE NEGATIVE) THE SIGNIFICANCE OF ISOLATING THIS ORGANISM FROM A SINGLE SET OF BLOOD CULTURES WHEN MULTIPLE SETS ARE DRAWN IS UNCERTAIN. PLEASE NOTIFY THE MICROBIOLOGY DEPARTMENT WITHIN ONE WEEK IF SPECIATION AND SENSITIVITIES ARE REQUIRED. IDENTIFICATION TO FOLLOW Performed at [facility], [place] [address], [place], [place] [address]    Report Status PENDING  Incomplete  SARS Coronavirus 2 (CEPHEID- Performed in [place] hospital lab), Hosp Order     Status: Abnormal   Collection Time: 02/16/2019  1:40 PM  Result Value Ref Range Status   SARS Coronavirus 2 POSITIVE (A) NEGATIVE Final    Comment: RESULT CALLED TO, READ BACK BY AND VERIFIED WITH: M. Coffey RN 14:50 02/05/2019 ([email or web address]) (NOTE) If result is NEGATIVE SARS-CoV-2 target nucleic acids are NOT DETECTED. The SARS-CoV-2 RNA is generally detectable in upper and lower  respiratory specimens during the acute phase of infection. The lowest  concentration of SARS-CoV-2 viral copies this assay can detect is 250  copies / mL. A negative result does not preclude SARS-CoV-2 infection  and should not be used as the sole basis for treatment or other  patient management decisions.  A negative result may occur with  improper specimen collection / handling, submission of specimen other  than nasopharyngeal swab, presence of viral mutation(s) within the  areas targeted by this assay, and inadequate number of viral copies  (<250 copies / mL). A negative result must be combined with clinical  observations, patient history, and epidemiological information. If result is POSITIVE SARS-CoV-2 target nucleic acids are DETECTED.  The SARS-CoV-2 RNA is generally detectable in upper and lower  respiratory specimens during the acute phase of infection.  Positive  results are indicative of active infection with SARS-CoV-2.  Clinical   correlation with patient history and other diagnostic  information is  necessary to determine patient infection status.  Positive results do  not rule out bacterial infection or co-infection with other viruses. If result is PRESUMPTIVE POSTIVE SARS-CoV-2 nucleic acids MAY BE PRESENT.   A presumptive positive result was obtained on the submitted specimen  and confirmed on repeat testing.  While 2019 novel coronavirus  (SARS-CoV-2) nucleic acids may be present in the submitted sample  additional confirmatory testing may be necessary for epidemiological  and / or clinical management purposes  to differentiate between  SARS-CoV-2 and other Sarbecovirus currently known to infect humans.  If clinically indicated additional testing with an alternate test  methodology [phone]) i s advised. The SARS-CoV-2 RNA is generally  detectable in upper and lower respiratory specimens during the acute  phase of infection. The expected result is Negative. Fact Sheet for Patients:  StrictlyIdeas.no Fact Sheet for Healthcare Providers: BankingDealers.co.za This test is not yet approved or cleared by the [place] FDA and has been authorized for detection and/or diagnosis of SARS-CoV-2 by FDA under an Emergency Use Authorization (EUA).  This EUA will remain in effect (meaning this test can be used) for the duration of the COVID-19 declaration under Section 564(b)(1) of the Act, 21 U.S.C. section 360bbb-3(b)(1), unless the authorization is terminated or revoked sooner. Performed at [facility], [place] [address], [place], [place] [address]     Radiology Reports Dg Chest Port 1 View  Result Date: 02/25/2019 CLINICAL DATA:  PICC line placement EXAM: PORTABLE CHEST 1 VIEW COMPARISON:  02/25/2019 FINDINGS: Placement of PICC line with tip in distal SVC. Normal cardiac silhouette. Probable hiatal hernia. Fine interstitial lung pattern. No pneumothorax  IMPRESSION: PICC line in appropriate position. Interstitial edema / pulmonary edema pattern. Electronically Signed   By: Suzy Bouchard M.D.   On: 02/25/2019 16:56   Dg Chest Port 1 View  Result Date: 02/25/2019 CLINICAL DATA:  Shortness of breath. EXAM: PORTABLE CHEST 1 VIEW COMPARISON:  02/03/2019.  10/18/2018.  05/20/2018. FINDINGS: Mediastinum and hilar structures normal. Cardiomegaly. No pulmonary venous congestion. Prominence of interstitial markings. A component these interstitial changes may be chronic however an active interstitial process including pneumonitis and/or interstitial edema cannot be excluded. No pleural effusion or pneumothorax. Carotid vascular calcification. Sliding hiatal hernia. IMPRESSION: 1. Cardiac pacer with lead tips over the right atrium and right ventricle. Stable cardiomegaly. No pulmonary venous congestion. 2. Prominence of interstitial markings. A component these interstitial changes may be chronic however an active interstitial process including pneumonitis and/or interstitial edema cannot be excluded. No pleural effusion or pneumothorax. 3.  Carotid vascular disease. Electronically Signed   By: Marcello Moores  Register   On: 02/25/2019 12:57   Dg Chest Port 1 View  Result Date: 02/26/2019 CLINICAL DATA:  Low O2 sats.  Shortness of breath. EXAM: PORTABLE CHEST 1 VIEW COMPARISON:  10/18/2018 FINDINGS: Patient is rotated to the right displacing cardiomediastinal anatomy into the medial right hemithorax. Interstitial markings are diffusely coarsened with chronic features. The cardio pericardial silhouette is enlarged. Permanent pacemaker noted. Bones are diffusely demineralized. IMPRESSION: Cardiomegaly with underlying diffuse chronic interstitial lung disease. Interstitial markings are more prominent today raising the question of interstitial edema although this may be related to positioning. No substantial pleural effusion. Electronically Signed   By: Misty Stanley M.D.   On:  02/10/2019 14:13   [place] Ekg Site Rite  Result Date: 02/25/2019 If Site Rite image not attached, placement could not be confirmed due to current cardiac rhythm.

## 2019-03-01 NOTE — Progress Notes (Signed)
Spoke with Dr. Sherral Hammers and he asked that I call family and notify of death.  I spoke with patient's daughter, [name], and made her aware.  She asked that her mother's ring and gold hoop earrings be sent with the body to [place].  I also advised no other belongings in the room.  All questions answered.  She will notify her brothers of her mother's death.  Earleen Reaper RN

## 2019-03-01 NOTE — Progress Notes (Signed)
Notified and updated daughter of process for family visitation.

## 2019-03-01 NOTE — Progress Notes (Signed)
Called and spoke with patient's daughter, [name].  I explained that patient was currently on Dilaudid drip for comfort.  I explained that her breathing had longer pauses in between.  She understands.  She states that she is not going to come visit her mother but she is waiting to see if any of her three brothers were going to come visit patient.  If brothers decide to come, he will call us directly.  When patient passes, we are to call daughter.  Offered my support and she could call with any questions or concerns.  Earleen Reaper RN

## 2019-03-01 NOTE — Progress Notes (Signed)
Patient went asystole @ 2105 and no aspirations.  Magnet applied to right pacemaker to deactivate.  Death was then confirmed by 2 RNs at bedside.  Dr. Sherral Hammers paged and made aware.  Earleen Reaper RN

## 2019-03-01 NOTE — Progress Notes (Signed)
Patient's son, Audelia Acton, called to check on his mother.  I was able to Face Time him with the patient so that he could tell her goodbye.  He works in [employer] and his employer will not allow him to visit without putting him on quarantine.  I supported him and let him know that we would take excellent care of his mother.  I encouraged him to call me at anytime.  Earleen Reaper RN

## 2019-03-01 NOTE — Care Plan (Addendum)
Dr Candiss Norse to bedside reported unable to give PO medications and food due to patient's mental status.  She is lethargic and not following commands. HR is sustaining110-125 and no PRN ordered. UOP is decreasing. See new orders.  1225 notified dr Wallace Keller unable to get temp at this time uncomfortable  doing rectal temp with INR 5.6. Unable to get oral, Ax reads 95.4. bear huger applied ax temp now 97.1 HR sustaining 110-120's. Appears uncomfortable  and resist when turned and dong mouth care. Breathing labored at times.. see new orders

## 2019-03-01 NOTE — Progress Notes (Addendum)
CRITICAL VALUE ALERT  Critical Value:  INR 5.6  Date & Time Notied:  03-21-19 0957  Provider Notified: Dr. Candiss Norse @ [phone] No new orders

## 2019-03-01 NOTE — Progress Notes (Signed)
Patient's son, Karma Greaser, and his wife called to check on their mother.  They were able to Face Time with their mother and tell her goodbye.  They are not able to visit her because of their health.  I encouraged them to call with any questions.  Earleen Reaper RN

## 2019-03-01 NOTE — Progress Notes (Signed)
   03/13/19 02-Jan-2140  Attending [place]  Attending Physician Notified Y  Attending Physician (First and Last Name) Dia Crawford MD  Will the above attending physician sign death certificate? Yes  Post Mortem Checklist  Date of Death 2019-03-13  Time of Death January 02, 2104  Pronounced By Earleen Reaper RN/Valerie  Wynetta [place] RN  Next of kin notified Yes  Name of next of kin notified of death Artemio Aly   Contact Person's Relationship to Patient Daughter  Contact Person's Phone Number [phone]  Contact Person's address [place] [address]  Family Communication Notes  (She will call her brothers)  Was the patient a No Code Blue or a Limited Code Blue? Yes  Did the patient die unattended? No  Patient restrained? Not applicable  Height 6' (0.814 m)  Weight 88 kg  Body preparation complete Y  [place] [facility]  Notification Date 03/13/19  Notification Time 2155-01-02  [place] [facility] Number [id number]  Is patient a potential donor? N  Autopsy  Autopsy requested by N/A  Patient Belongings/Medications Returned  Patient belongings from bedside/safe/pharmacy returned   (gold hoops and one ring with body in specimen cup)  Medical Examiner  Is this a medical examiner's case? [place] home name/address/phone # [place] - [address], [place]  Planned location of [place]

## 2019-03-01 DEATH — deceased

## 2019-03-02 LAB — CULTURE, BLOOD (ROUTINE X 2)

## 2019-03-04 ENCOUNTER — Telehealth: Payer: Self-pay | Admitting: Internal Medicine

## 2019-03-04 NOTE — Telephone Encounter (Signed)
LMOM to confirm address for return box.

## 2019-03-04 NOTE — Telephone Encounter (Signed)
New Message     Pts daughter is calling and is wondering what she should do with the pts device     Please call back

## 2019-03-04 NOTE — Telephone Encounter (Signed)
Pt address confirmed. Removed from [place] and inactive in [place].

## 2019-03-04 NOTE — Telephone Encounter (Signed)
Removed from [place]. Will remove from [place] pending CB.

## 2019-03-31 NOTE — Discharge Summary (Signed)
Death Summary  [name] [name] WJX:[id number] DOB: 05-24-1931 DOA: 03/16/19  PCP: Hoyt Koch, MD PCP/Office notified: No  Admit date: 16-Mar-2019 Date of Death: 03-23-19  Final Diagnoses:  Principal Problem:   COVID-19 virus infection Active Problems:   History of pulmonary embolism   Pacemaker-Medtronic   Chronic anticoagulation-Coumadin   Acute respiratory failure with hypoxia (HCC)   Hypotension   Hypothermia   Acute kidney injury superimposed on chronic kidney disease ([place])   1.,Acute Hypoxic Resp. Failure due to Acute Covid 19 Viral Illness during the ongoing 2020 Covid 19 Pandemic - CXR suggests pneumonitis with mild hypoxia requiring 1 to 2 L oxygen, inflammatory markers were borderline, placed on IV Solu-Medrol and Actemra was given on 02/24/2019, her oxygenation is stable and she is currently between 1 to 2 L nasal cannula oxygen. I-S flutter valve if she can use and tolerate for pulmonary toiletry.    2.Acute on chronic combined systolic and diastolic CHF, recent echo 45% - clinically dehydrated continue to monitor.  Unable to take oral medications hence oral diltiazem on hold, PRN IV diltiazem added for rate control of underlying A. fib if needed.   3.  Chronic dementia with delirium and toxic encephalopathy.  On Namenda which will be continued, still getting quite agitated at nighttime and aggressive towards the staff, low-dose Haldol given on 02/23/2019 night seems to have made her very somnolent, will try nighttime Seroquel instead, soft mittens for patient and staff protection.  Now on full comfort medications.   4.  History of DVT PE.  On Coumadin with last INR 5.6 on March 22, 2019, now comfort care.   5.  Chronic atrial fibrillation [place] vas 2 score of at least 4.  Coumadin and PRN IV diltiazem, cannot take oral diltiazem right now due to somnolence.  Blood pressure too low stopped beta-blocker.   6.  ARF on CKD stage IV.  Baseline creatinine 2.3.  Was hydrated  and now transition to full comfort care.   7.  GERD.  Placed on PPI.   8.  UTI.  Treated with Maxipime this admission.     9.  Mild hypothermia-  blood cultures overall clinically negative except for 1 out of 2 contamination with coag negative staph.  Her random TSH and cortisol are stable, continue supportive care with bear hugger and increasing room temperature.  This problem currently improved.   10.  Urinary retention.  Developed 02/25/2019.  Foley and Flomax and monitor.    11.  Hypernatremia and hypokalemia.  Electrolytes were replaced now comfort care.    12.  DM type II.  Currently in poor control due to hypoglycemia.  Currently on sliding scale and monitor.   History of present illness:  [name] Suffernis a 85 y.o.femalewith medical history significant of severe underlying dementia,A. fib, DVT/PE on Coumadin, tachy-brady syndrome s/p PPM, breast cancers/pmastectomy, chronic diastolic heart failure, DM type II, PAH, CKD stage IV, and frequent UTIs; who presents with reports of low oxygenation from [facility], she was diagnosed with COVID-19 pneumonitis and admitted to the [facility] hospital.  Despite appropriate treatment for COVID-19 pneumonia leading to acute on chronic respiratory failure with hypoxia patient continued to decline.  Patient was made comfort care on 5/31 secondary to patient's multiple medical problems as well as continuing decline secondary to COVID-19 pneumonia.  Patient expired on 5/31 at 2105   Time: 2105  Signed:  Dia Crawford, MD [facility] [phone]
